# Patient Record
Sex: Female | Born: 1985 | Race: White | Hispanic: No | Marital: Married | Smoking: Never smoker
Health system: Southern US, Community
[De-identification: ages and names within clinical notes are randomized; demographics above are authoritative.]

## PROBLEM LIST (undated history)

## (undated) DIAGNOSIS — K56609 Unspecified intestinal obstruction, unspecified as to partial versus complete obstruction: Secondary | ICD-10-CM

## (undated) DIAGNOSIS — K602 Anal fissure, unspecified: Secondary | ICD-10-CM

## (undated) DIAGNOSIS — E559 Vitamin D deficiency, unspecified: Secondary | ICD-10-CM

## (undated) DIAGNOSIS — E538 Deficiency of other specified B group vitamins: Secondary | ICD-10-CM

## (undated) DIAGNOSIS — K529 Noninfective gastroenteritis and colitis, unspecified: Secondary | ICD-10-CM

## (undated) DIAGNOSIS — K5090 Crohn's disease, unspecified, without complications: Secondary | ICD-10-CM

## (undated) DIAGNOSIS — K635 Polyp of colon: Secondary | ICD-10-CM

## (undated) DIAGNOSIS — D649 Anemia, unspecified: Secondary | ICD-10-CM

## (undated) DIAGNOSIS — N63 Unspecified lump in unspecified breast: Secondary | ICD-10-CM

## (undated) DIAGNOSIS — C50919 Malignant neoplasm of unspecified site of unspecified female breast: Secondary | ICD-10-CM

## (undated) DIAGNOSIS — B019 Varicella without complication: Secondary | ICD-10-CM

## (undated) DIAGNOSIS — Z124 Encounter for screening for malignant neoplasm of cervix: Secondary | ICD-10-CM

## (undated) DIAGNOSIS — L309 Dermatitis, unspecified: Secondary | ICD-10-CM

## (undated) DIAGNOSIS — Z789 Other specified health status: Secondary | ICD-10-CM

## (undated) DIAGNOSIS — K219 Gastro-esophageal reflux disease without esophagitis: Secondary | ICD-10-CM

## (undated) DIAGNOSIS — L509 Urticaria, unspecified: Secondary | ICD-10-CM

## (undated) HISTORY — DX: Polyp of colon: K63.5

## (undated) HISTORY — DX: Anemia, unspecified: D64.9

## (undated) HISTORY — DX: Encounter for screening for malignant neoplasm of cervix: Z12.4

## (undated) HISTORY — DX: Malignant neoplasm of unspecified site of unspecified female breast: C50.919

## (undated) HISTORY — DX: Crohn's disease, unspecified, without complications: K50.90

## (undated) HISTORY — DX: Unspecified intestinal obstruction, unspecified as to partial versus complete obstruction: K56.609

## (undated) HISTORY — DX: Anal fissure, unspecified: K60.2

## (undated) HISTORY — DX: Unspecified lump in unspecified breast: N63.0

## (undated) HISTORY — DX: Noninfective gastroenteritis and colitis, unspecified: K52.9

## (undated) HISTORY — DX: Gastro-esophageal reflux disease without esophagitis: K21.9

## (undated) HISTORY — DX: Varicella without complication: B01.9

## (undated) HISTORY — DX: Vitamin D deficiency, unspecified: E55.9

## (undated) HISTORY — DX: Dermatitis, unspecified: L30.9

## (undated) HISTORY — PX: WISDOM TOOTH EXTRACTION: SHX21

---

## 2011-01-25 DIAGNOSIS — K5090 Crohn's disease, unspecified, without complications: Secondary | ICD-10-CM

## 2011-01-25 HISTORY — DX: Crohn's disease, unspecified, without complications: K50.90

## 2011-11-01 ENCOUNTER — Emergency Department (HOSPITAL_COMMUNITY)
Admission: EM | Admit: 2011-11-01 | Discharge: 2011-11-01 | Disposition: A | Discharge status: Home / Self Care | Payer: BC Managed Care – PPO | Attending: Emergency Medicine | Admitting: Emergency Medicine

## 2011-11-01 ENCOUNTER — Emergency Department (HOSPITAL_COMMUNITY): Payer: BC Managed Care – PPO

## 2011-11-01 ENCOUNTER — Encounter (HOSPITAL_COMMUNITY): Payer: Self-pay

## 2011-11-01 DIAGNOSIS — K5289 Other specified noninfective gastroenteritis and colitis: Secondary | ICD-10-CM | POA: Insufficient documentation

## 2011-11-01 DIAGNOSIS — R000 Tachycardia, unspecified: Secondary | ICD-10-CM | POA: Insufficient documentation

## 2011-11-01 DIAGNOSIS — R109 Unspecified abdominal pain: Secondary | ICD-10-CM | POA: Insufficient documentation

## 2011-11-01 DIAGNOSIS — K529 Noninfective gastroenteritis and colitis, unspecified: Secondary | ICD-10-CM

## 2011-11-01 LAB — COMPREHENSIVE METABOLIC PANEL
ALT: 10 U/L (ref 0–35)
Alkaline Phosphatase: 65 U/L (ref 39–117)
BUN: 8 mg/dL (ref 6–23)
CO2: 24 mEq/L (ref 19–32)
GFR calc Af Amer: >90 mL/min (ref >90)
GFR calc non Af Amer: >90 mL/min (ref >90)
Glucose, Bld: 110 mg/dL — ABNORMAL HIGH (ref 70–99)
Potassium: 3.9 mEq/L (ref 3.5–5.1)
Sodium: 137 mEq/L (ref 135–145)
Total Protein: 7.4 g/dL (ref 6.0–8.3)

## 2011-11-01 LAB — CBC
HCT: 37.4 % (ref 36.0–46.0)
MCHC: 32.6 g/dL (ref 30.0–36.0)
Platelets: 368 10*3/uL (ref 150–400)
RDW: 12.8 % (ref 11.5–15.5)
WBC: 13.9 10*3/uL — ABNORMAL HIGH (ref 4.0–10.5)

## 2011-11-01 LAB — URINALYSIS, ROUTINE W REFLEX MICROSCOPIC
Leukocytes, UA: NEGATIVE
Nitrite: NEGATIVE
Protein, ur: NEGATIVE mg/dL
Specific Gravity, Urine: 1.009 (ref 1.005–1.030)
Urobilinogen, UA: 0.2 mg/dL (ref 0.0–1.0)

## 2011-11-01 LAB — URINE MICROSCOPIC-ADD ON

## 2011-11-01 LAB — LIPASE, BLOOD: Lipase: 49 U/L (ref 11–59)

## 2011-11-01 LAB — WET PREP, GENITAL

## 2011-11-01 LAB — POCT PREGNANCY, URINE: Preg Test, Ur: NEGATIVE

## 2011-11-01 MED ORDER — ONDANSETRON HCL 4 MG/2ML IJ SOLN
4.0000 mg | Freq: Once | INTRAMUSCULAR | Status: AC
  Administered 2011-11-01: 4 mg via INTRAVENOUS
  Filled 2011-11-01: qty 2

## 2011-11-01 MED ORDER — MORPHINE SULFATE 4 MG/ML IJ SOLN
4.0000 mg | Freq: Once | INTRAMUSCULAR | Status: AC
  Administered 2011-11-01: 4 mg via INTRAVENOUS
  Filled 2011-11-01: qty 1

## 2011-11-01 MED ORDER — METRONIDAZOLE 500 MG PO TABS: 500.0000 mg | ORAL_TABLET | Freq: Two times a day (BID) | ORAL | Status: DC

## 2011-11-01 MED ORDER — METRONIDAZOLE 500 MG PO TABS
500.0000 mg | ORAL_TABLET | Freq: Once | ORAL | Status: AC
  Administered 2011-11-01: 500 mg via ORAL
  Filled 2011-11-01: qty 1

## 2011-11-01 MED ORDER — OXYCODONE-ACETAMINOPHEN 5-325 MG PO TABS
1.0000 | ORAL_TABLET | Freq: Once | ORAL | Status: AC
  Administered 2011-11-01: 1 via ORAL
  Filled 2011-11-01: qty 1

## 2011-11-01 MED ORDER — HYDROCODONE-ACETAMINOPHEN 5-500 MG PO TABS: 1.0000 | ORAL_TABLET | Freq: Four times a day (QID) | ORAL | Status: DC | PRN

## 2011-11-01 MED ORDER — CIPROFLOXACIN HCL 500 MG PO TABS: 500.0000 mg | ORAL_TABLET | Freq: Two times a day (BID) | ORAL | Status: DC

## 2011-11-01 MED ORDER — CIPROFLOXACIN HCL 500 MG PO TABS
500.0000 mg | ORAL_TABLET | Freq: Once | ORAL | Status: AC
  Administered 2011-11-01: 500 mg via ORAL
  Filled 2011-11-01: qty 1

## 2011-11-01 MED ORDER — IOHEXOL 300 MG/ML  SOLN
100.0000 mL | Freq: Once | INTRAMUSCULAR | Status: AC | PRN
  Administered 2011-11-01: 100 mL via INTRAVENOUS

## 2011-11-01 MED ORDER — ONDANSETRON HCL 4 MG PO TABS: 4.0000 mg | ORAL_TABLET | Freq: Four times a day (QID) | ORAL | Status: DC

## 2011-11-01 NOTE — ED Provider Notes (Signed)
History     CSN: [id number]  Arrival date & time 11/01/11  0901   First MD Initiated Contact with Patient 11/01/11 [phone]      Chief Complaint  Patient presents with  . Abdominal Pain    RLQ pain severe since 5am    (Consider location/radiation/quality/duration/timing/severity/associated sxs/prior treatment) Patient is a 26 y.o. female presenting with abdominal pain. The history is provided by the patient.  Abdominal Pain The primary symptoms of the illness include abdominal pain, nausea and vomiting. The primary symptoms of the illness do not include diarrhea, dysuria or vaginal discharge. Episode onset: Mild abdominal pain starting last night. Woke up at 5:30 AM with severe pain. The onset of the illness was gradual. The problem has been rapidly worsening.  The abdominal pain is located in the RLQ and periumbilical region. The abdominal pain does not radiate. The severity of the abdominal pain is 10/10. The abdominal pain is relieved by being still. The abdominal pain is exacerbated by movement.  The patient states that she believes she is currently not pregnant. The patient has not had a change in bowel habit. Additional symptoms associated with the illness include anorexia. Symptoms associated with the illness do not include urgency, frequency or back pain. Associated medical issues comments: none.    History reviewed. No pertinent past medical history.  History reviewed. No pertinent past surgical history.  No family history on file.  History  Substance Use Topics  . Smoking status: Not on file  . Smokeless tobacco: Not on file  . Alcohol Use: No    OB History    Grav Para Term Preterm Abortions TAB SAB Ect Mult Living                  Review of Systems  Gastrointestinal: Positive for nausea, vomiting, abdominal pain and anorexia. Negative for diarrhea.  Genitourinary: Negative for dysuria, urgency, frequency and vaginal discharge.  Musculoskeletal: Negative for back  pain.  All other systems reviewed and are negative.    Allergies  Review of patient's allergies indicates no known allergies.  Home Medications  No current outpatient prescriptions on file.  BP 103/61  Pulse 103  Temp 98.8 F (37.1 C) (Oral)  Resp 18  Ht 5' 3"  (1.6 m)  Wt 125 lb (56.7 kg)  BMI 22.14 kg/m2  SpO2 100%  LMP 10/28/2011  Physical Exam  Nursing note and vitals reviewed. Constitutional: She is oriented to person, place, and time. She appears well-developed and well-nourished. She appears distressed.  HENT:  Head: Normocephalic and atraumatic.  Mouth/Throat: Oropharynx is clear and moist.  Eyes: Conjunctivae normal and EOM are normal. Pupils are equal, round, and reactive to light.  Neck: Normal range of motion. Neck supple.  Cardiovascular: Regular rhythm and intact distal pulses.  Tachycardia present.   No murmur heard. Pulmonary/Chest: Effort normal and breath sounds normal. No respiratory distress. She has no wheezes. She has no rales.  Abdominal: Soft. Normal appearance. She exhibits no distension. There is tenderness in the right lower quadrant and periumbilical area. There is rebound and guarding. There is no CVA tenderness.  Genitourinary: Vagina normal and uterus normal. Cervix exhibits no motion tenderness, no discharge and no friability. Right adnexum displays no tenderness. Left adnexum displays no tenderness. No tenderness or bleeding around the vagina. No vaginal discharge found.  Musculoskeletal: Normal range of motion. She exhibits no edema and no tenderness.  Neurological: She is alert and oriented to person, place, and time.  Skin: Skin is  warm and dry. No rash noted. No erythema.  Psychiatric: She has a normal mood and affect. Her behavior is normal.    ED Course  Procedures (including critical care time)  Labs Reviewed  COMPREHENSIVE METABOLIC PANEL - Abnormal; Notable for the following:    Glucose, Bld 110 (*)     All other components  within normal limits  CBC - Abnormal; Notable for the following:    WBC 13.9 (*)     All other components within normal limits  URINALYSIS, ROUTINE W REFLEX MICROSCOPIC - Abnormal; Notable for the following:    APPearance CLOUDY (*)     Hgb urine dipstick SMALL (*)     Ketones, ur 15 (*)     All other components within normal limits  WET PREP, GENITAL - Abnormal; Notable for the following:    Yeast Wet Prep HPF POC RARE (*)     Clue Cells Wet Prep HPF POC RARE (*)     WBC, Wet Prep HPF POC FEW (*)     All other components within normal limits  URINE MICROSCOPIC-ADD ON - Abnormal; Notable for the following:    Bacteria, UA FEW (*)     All other components within normal limits  LIPASE, BLOOD  POCT PREGNANCY, URINE  GC/CHLAMYDIA PROBE AMP, GENITAL   Ct Abdomen Pelvis W Contrast  11/01/2011  *RADIOLOGY REPORT*  Clinical Data: Right side abdominal pain for 1 day, nausea, negative urine pregnancy test, question appendicitis  CT ABDOMEN AND PELVIS WITH CONTRAST  Technique:  Multidetector CT imaging of the abdomen and pelvis was performed following the standard protocol during bolus administration of intravenous contrast. Sagittal and coronal MPR images reconstructed from axial data set.  Contrast: 189m OMNIPAQUE IOHEXOL 300 MG/ML  SOLN Dilute oral contrast.  Comparison: None  Findings: Dependent atelectasis bilateral lower lobes. Liver, spleen, pancreas, kidneys, and adrenal glands normal. Normal appendix. Wall thickening of terminal ileum with adjacent infiltrative/inflammatory changes. Small amount of free fluid is seen within the pericolic gutters and dependently in pelvis. Minimal wall thickening at the cecum. Findings are most suspicious for Crohn's disease though infection is not excluded. Remaining bowel loops unremarkable. Normal appearance of bladder, uterus, adnexae, and ureters. No mass, adenopathy, hernia, or free air. No acute osseous findings.  IMPRESSION: Bowel wall thickening of the  terminal ileum extending to the ileocecal valve with mild adjacent inflammatory changes and small amounts of free intraperitoneal fluid. Findings are most suspicious for Crohn's disease though infectious enteritis is not completely excluded. Normal appendix.   Original Report Authenticated By: MBurnetta Sabin M.D.      1. Enteritis       MDM   Patient with a history of mild abdominal pain last night but waking up this morning with severe epigastric and right lower cautery pain. She denies any flank pain or symptoms suggestive of kidney stone. She has significant right lower quadrant pain with guarding and rebound tenderness. Concern for appendicitis or could be ovarian pathology. However patient denies history of ovarian cyst, no symptoms suggestive of torsion and recently finished her menses making ectopic pregnancy less likely. CBC, CMP, lipase, UA, UPT pending. Patient given pain control  10:30 AM Leukocytosis of 13,000 and normal CMP and lipase. UA and UPT pending but on pelvic exam she has no tenderness of her ovaries so will do a CT to rule out appendicitis  2:16 PM Pt with no prior hx of crohn's and no hx of chronic abd pain.  CT shows  possible infectious enteritis and with the presentation of sx most likely the cause.  Will po challenge and give oral pain meds and abx.    Blanchie Dessert, MD 11/01/11 [phone]

## 2011-11-01 NOTE — ED Notes (Signed)
Pt given water, able to keep it down, no nausea/vomiting.

## 2011-11-01 NOTE — Progress Notes (Signed)
No pcp per pt CM reviewed with her and 2 females at her beside how to obtain an in network pcp near her residence using [employer] toll free number and web site

## 2011-11-01 NOTE — ED Notes (Signed)
Per [facility]- Pt reports sudden onset RLQ stomach pain- nausea and vomit x 1 - denies fever- Pt reports sever tenderness to RUQ and RLQ pain

## 2011-11-04 ENCOUNTER — Ambulatory Visit (INDEPENDENT_AMBULATORY_CARE_PROVIDER_SITE_OTHER): Payer: BC Managed Care – PPO | Admitting: Family Medicine

## 2011-11-04 ENCOUNTER — Encounter: Payer: Self-pay | Admitting: Family Medicine

## 2011-11-04 ENCOUNTER — Encounter: Payer: Self-pay | Admitting: Gastroenterology

## 2011-11-04 VITALS — BP 112/74 | HR 106 | Temp 98.1°F | Ht 63.0 in | Wt 121.8 lb

## 2011-11-04 DIAGNOSIS — K5289 Other specified noninfective gastroenteritis and colitis: Secondary | ICD-10-CM

## 2011-11-04 DIAGNOSIS — K529 Noninfective gastroenteritis and colitis, unspecified: Secondary | ICD-10-CM

## 2011-11-04 DIAGNOSIS — Z0000 Encounter for general adult medical examination without abnormal findings: Secondary | ICD-10-CM

## 2011-11-04 MED ORDER — HYDROCODONE-ACETAMINOPHEN 5-325 MG PO TABS: 1.0000 | ORAL_TABLET | Freq: Four times a day (QID) | ORAL | Status: DC | PRN

## 2011-11-04 MED ORDER — PROBIOTIC PRODUCT PO CHEW: CHEWABLE_TABLET | ORAL | Status: DC

## 2011-11-04 MED ORDER — ONDANSETRON HCL 4 MG PO TABS: 4.0000 mg | ORAL_TABLET | Freq: Four times a day (QID) | ORAL | Status: DC

## 2011-11-04 NOTE — Patient Instructions (Addendum)
Preventive Care for Adults, Female A healthy lifestyle and preventive care can promote health and wellness. Preventive health guidelines for women include the following key practices.  A routine yearly physical is a good way to check with your caregiver about your health and preventive screening. It is a chance to share any concerns and updates on your health, and to receive a thorough exam.  Visit your dentist for a routine exam and preventive care every 6 months. Brush your teeth twice a day and floss once a day. Good oral hygiene prevents tooth decay and gum disease.  The frequency of eye exams is based on your age, health, family medical history, use of contact lenses, and other factors. Follow your caregiver's recommendations for frequency of eye exams.  Eat a healthy diet. Foods like vegetables, fruits, whole grains, low-fat dairy products, and lean protein foods contain the nutrients you need without too many calories. Decrease your intake of foods high in solid fats, added sugars, and salt. Eat the right amount of calories for you.Get information about a proper diet from your caregiver, if necessary.  Regular physical exercise is one of the most important things you can do for your health. Most adults should get at least 150 minutes of moderate-intensity exercise (any activity that increases your heart rate and causes you to sweat) each week. In addition, most adults need muscle-strengthening exercises on 2 or more days a week.  Maintain a healthy weight. The body mass index (BMI) is a screening tool to identify possible weight problems. It provides an estimate of body fat based on height and weight. Your caregiver can help determine your BMI, and can help you achieve or maintain a healthy weight.For adults 20 years and older:  A BMI below 18.5 is considered underweight.  A BMI of 18.5 to 24.9 is normal.  A BMI of 25 to 29.9 is considered overweight.  A BMI of 30 and above is  considered obese.  Maintain normal blood lipids and cholesterol levels by exercising and minimizing your intake of saturated fat. Eat a balanced diet with plenty of fruit and vegetables. Blood tests for lipids and cholesterol should begin at age 25 and be repeated every 5 years. If your lipid or cholesterol levels are high, you are over 50, or you are at high risk for heart disease, you may need your cholesterol levels checked more frequently.Ongoing high lipid and cholesterol levels should be treated with medicines if diet and exercise are not effective.  If you smoke, find out from your caregiver how to quit. If you do not use tobacco, do not start.  If you are pregnant, do not drink alcohol. If you are breastfeeding, be very cautious about drinking alcohol. If you are not pregnant and choose to drink alcohol, do not exceed 1 drink per day. One drink is considered to be 12 ounces (355 mL) of beer, 5 ounces (148 mL) of wine, or 1.5 ounces (44 mL) of liquor.  Avoid use of street drugs. Do not share needles with anyone. Ask for help if you need support or instructions about stopping the use of drugs.  High blood pressure causes heart disease and increases the risk of stroke. Your blood pressure should be checked at least every 1 to 2 years. Ongoing high blood pressure should be treated with medicines if weight loss and exercise are not effective.  If you are 62 to 24 years old, ask your caregiver if you should take aspirin to prevent strokes.  Diabetes  screening involves taking a blood sample to check your fasting blood sugar level. This should be done once every 3 years, after age 51, if you are within normal weight and without risk factors for diabetes. Testing should be considered at a younger age or be carried out more frequently if you are overweight and have at least 1 risk factor for diabetes.  Breast cancer screening is essential preventive care for women. You should practice "breast  self-awareness." This means understanding the normal appearance and feel of your breasts and may include breast self-examination. Any changes detected, no matter how small, should be reported to a caregiver. Women in their 30s and 30s should have a clinical breast exam (CBE) by a caregiver as part of a regular health exam every 1 to 3 years. After age 25, women should have a CBE every year. Starting at age 37, women should consider having a mammography (breast X-ray test) every year. Women who have a family history of breast cancer should talk to their caregiver about genetic screening. Women at a high risk of breast cancer should talk to their caregivers about having magnetic resonance imaging (MRI) and a mammography every year.  The Pap test is a screening test for cervical cancer. A Pap test can show cell changes on the cervix that might become cervical cancer if left untreated. A Pap test is a procedure in which cells are obtained and examined from the lower end of the uterus (cervix).  Women should have a Pap test starting at age 87.  Between ages 63 and 54, Pap tests should be repeated every 2 years.  Beginning at age 33, you should have a Pap test every 3 years as long as the past 3 Pap tests have been normal.  Some women have medical problems that increase the chance of getting cervical cancer. Talk to your caregiver about these problems. It is especially important to talk to your caregiver if a new problem develops soon after your last Pap test. In these cases, your caregiver may recommend more frequent screening and Pap tests.  The above recommendations are the same for women who have or have not gotten the vaccine for human papillomavirus (HPV).  If you had a hysterectomy for a problem that was not cancer or a condition that could lead to cancer, then you no longer need Pap tests. Even if you no longer need a Pap test, a regular exam is a good idea to make sure no other problems are  starting.  If you are between ages 67 and 53, and you have had normal Pap tests going back 10 years, you no longer need Pap tests. Even if you no longer need a Pap test, a regular exam is a good idea to make sure no other problems are starting.  If you have had past treatment for cervical cancer or a condition that could lead to cancer, you need Pap tests and screening for cancer for at least 20 years after your treatment.  If Pap tests have been discontinued, risk factors (such as a new sexual partner) need to be reassessed to determine if screening should be resumed.  The HPV test is an additional test that may be used for cervical cancer screening. The HPV test looks for the virus that can cause the cell changes on the cervix. The cells collected during the Pap test can be tested for HPV. The HPV test could be used to screen women aged 59 years and older, and should  be used in women of any age who have unclear Pap test results. After the age of 104, women should have HPV testing at the same frequency as a Pap test.  Colorectal cancer can be detected and often prevented. Most routine colorectal cancer screening begins at the age of 7 and continues through age 20. However, your caregiver may recommend screening at an earlier age if you have risk factors for colon cancer. On a yearly basis, your caregiver may provide home test kits to check for hidden blood in the stool. Use of a small camera at the end of a tube, to directly examine the colon (sigmoidoscopy or colonoscopy), can detect the earliest forms of colorectal cancer. Talk to your caregiver about this at age 47, when routine screening begins. Direct examination of the colon should be repeated every 5 to 10 years through age 62, unless early forms of pre-cancerous polyps or small growths are found.  Hepatitis C blood testing is recommended for all people born from [birth year] through 1965 and any individual with known risks for hepatitis C.  Practice  safe sex. Use condoms and avoid high-risk sexual practices to reduce the spread of sexually transmitted infections (STIs). STIs include gonorrhea, chlamydia, syphilis, trichomonas, herpes, HPV, and human immunodeficiency virus (HIV). Herpes, HIV, and HPV are viral illnesses that have no cure. They can result in disability, cancer, and death. Sexually active women aged 78 and younger should be checked for chlamydia. Older women with new or multiple partners should also be tested for chlamydia. Testing for other STIs is recommended if you are sexually active and at increased risk.  Osteoporosis is a disease in which the bones lose minerals and strength with aging. This can result in serious bone fractures. The risk of osteoporosis can be identified using a bone density scan. Women ages 12 and over and women at risk for fractures or osteoporosis should discuss screening with their caregivers. Ask your caregiver whether you should take a calcium supplement or vitamin D to reduce the rate of osteoporosis.  Menopause can be associated with physical symptoms and risks. Hormone replacement therapy is available to decrease symptoms and risks. You should talk to your caregiver about whether hormone replacement therapy is right for you.  Use sunscreen with sun protection factor (SPF) of 30 or more. Apply sunscreen liberally and repeatedly throughout the day. You should seek shade when your shadow is shorter than you. Protect yourself by wearing long sleeves, pants, a wide-brimmed hat, and sunglasses year round, whenever you are outdoors.  Once a month, do a whole body skin exam, using a mirror to look at the skin on your back. Notify your caregiver of new moles, moles that have irregular borders, moles that are larger than a pencil eraser, or moles that have changed in shape or color.  Stay current with required immunizations.  Influenza. You need a dose every fall (or winter). The composition of the flu vaccine  changes each year, so being vaccinated once is not enough.  Pneumococcal polysaccharide. You need 1 to 2 doses if you smoke cigarettes or if you have certain chronic medical conditions. You need 1 dose at age 26 (or older) if you have never been vaccinated.  Tetanus, diphtheria, pertussis (Tdap, Td). Get 1 dose of Tdap vaccine if you are younger than age 30, are over 32 and have contact with an infant, are a Dietitian, are pregnant, or simply want to be protected from whooping cough. After that, you need a Td  booster dose every 10 years. Consult your caregiver if you have not had at least 3 tetanus and diphtheria-containing shots sometime in your life or have a deep or dirty wound.  HPV. You need this vaccine if you are a woman age 50 or younger. The vaccine is given in 3 doses over 6 months.  Measles, mumps, rubella (MMR). You need at least 1 dose of MMR if you were born in 1957 or later. You may also need a second dose.  Meningococcal. If you are age 54 to 46 and a first-year college student living in a residence hall, or have one of several medical conditions, you need to get vaccinated against meningococcal disease. You may also need additional booster doses.  Zoster (shingles). If you are age 27 or older, you should get this vaccine.  Varicella (chickenpox). If you have never had chickenpox or you were vaccinated but received only 1 dose, talk to your caregiver to find out if you need this vaccine.  Hepatitis A. You need this vaccine if you have a specific risk factor for hepatitis A virus infection or you simply wish to be protected from this disease. The vaccine is usually given as 2 doses, 6 to 18 months apart.  Hepatitis B. You need this vaccine if you have a specific risk factor for hepatitis B virus infection or you simply wish to be protected from this disease. The vaccine is given in 3 doses, usually over 6 months. Preventive Services / Frequency Ages 23 to 96  Blood  pressure check.** / Every 1 to 2 years.  Lipid and cholesterol check.** / Every 5 years beginning at age 23.  Clinical breast exam.** / Every 3 years for women in their 42s and 73s.  Pap test.** / Every 2 years from ages 30 through 38. Every 3 years starting at age 25 through age 20 or 63 with a history of 3 consecutive normal Pap tests.  HPV screening.** / Every 3 years from ages 11 through ages 25 to 35 with a history of 3 consecutive normal Pap tests.  Hepatitis C blood test.** / For any individual with known risks for hepatitis C.  Skin self-exam. / Monthly.  Influenza immunization.** / Every year.  Pneumococcal polysaccharide immunization.** / 1 to 2 doses if you smoke cigarettes or if you have certain chronic medical conditions.  Tetanus, diphtheria, pertussis (Tdap, Td) immunization. / A one-time dose of Tdap vaccine. After that, you need a Td booster dose every 10 years.  HPV immunization. / 3 doses over 6 months, if you are 29 and younger.  Measles, mumps, rubella (MMR) immunization. / You need at least 1 dose of MMR if you were born in 1957 or later. You may also need a second dose.  Meningococcal immunization. / 1 dose if you are age 40 to 32 and a first-year college student living in a residence hall, or have one of several medical conditions, you need to get vaccinated against meningococcal disease. You may also need additional booster doses.  Varicella immunization.** / Consult your caregiver.  Hepatitis A immunization.** / Consult your caregiver. 2 doses, 6 to 18 months apart.  Hepatitis B immunization.** / Consult your caregiver. 3 doses usually over 6 months. Ages 45 to 17  Blood pressure check.** / Every 1 to 2 years.  Lipid and cholesterol check.** / Every 5 years beginning at age 19.  Clinical breast exam.** / Every year after age 75.  Mammogram.** / Every year beginning at age 12  and continuing for as long as you are in good health. Consult with your  caregiver.  Pap test.** / Every 3 years starting at age 7 through age 75 or 63 with a history of 3 consecutive normal Pap tests.  HPV screening.** / Every 3 years from ages 63 through ages 62 to 34 with a history of 3 consecutive normal Pap tests.  Fecal occult blood test (FOBT) of stool. / Every year beginning at age 7 and continuing until age 45. You may not need to do this test if you get a colonoscopy every 10 years.  Flexible sigmoidoscopy or colonoscopy.** / Every 5 years for a flexible sigmoidoscopy or every 10 years for a colonoscopy beginning at age 28 and continuing until age 79.  Hepatitis C blood test.** / For all people born from [birth year] through 1963 and any individual with known risks for hepatitis C.  Skin self-exam. / Monthly.  Influenza immunization.** / Every year.  Pneumococcal polysaccharide immunization.** / 1 to 2 doses if you smoke cigarettes or if you have certain chronic medical conditions.  Tetanus, diphtheria, pertussis (Tdap, Td) immunization.** / A one-time dose of Tdap vaccine. After that, you need a Td booster dose every 10 years.  Measles, mumps, rubella (MMR) immunization. / You need at least 1 dose of MMR if you were born in 1957 or later. You may also need a second dose.  Varicella immunization.** / Consult your caregiver.  Meningococcal immunization.** / Consult your caregiver.  Hepatitis A immunization.** / Consult your caregiver. 2 doses, 6 to 18 months apart.  Hepatitis B immunization.** / Consult your caregiver. 3 doses, usually over 6 months. Ages 86 and over  Blood pressure check.** / Every 1 to 2 years.  Lipid and cholesterol check.** / Every 5 years beginning at age 10.  Clinical breast exam.** / Every year after age 34.  Mammogram.** / Every year beginning at age 33 and continuing for as long as you are in good health. Consult with your caregiver.  Pap test.** / Every 3 years starting at age 93 through age 59 or 57 with a 3  consecutive normal Pap tests. Testing can be stopped between 65 and 70 with 3 consecutive normal Pap tests and no abnormal Pap or HPV tests in the past 10 years.  HPV screening.** / Every 3 years from ages 41 through ages 93 or 37 with a history of 3 consecutive normal Pap tests. Testing can be stopped between 65 and 70 with 3 consecutive normal Pap tests and no abnormal Pap or HPV tests in the past 10 years.  Fecal occult blood test (FOBT) of stool. / Every year beginning at age 70 and continuing until age 28. You may not need to do this test if you get a colonoscopy every 10 years.  Flexible sigmoidoscopy or colonoscopy.** / Every 5 years for a flexible sigmoidoscopy or every 10 years for a colonoscopy beginning at age 41 and continuing until age 2.  Hepatitis C blood test.** / For all people born from [birth year] through 1963 and any individual with known risks for hepatitis C.  Osteoporosis screening.** / A one-time screening for women ages 12 and over and women at risk for fractures or osteoporosis.  Skin self-exam. / Monthly.  Influenza immunization.** / Every year.  Pneumococcal polysaccharide immunization.** / 1 dose at age 80 (or older) if you have never been vaccinated.  Tetanus, diphtheria, pertussis (Tdap, Td) immunization. / A one-time dose of Tdap vaccine if you are over  65 and have contact with an infant, are a Dietitian, or simply want to be protected from whooping cough. After that, you need a Td booster dose every 10 years.  Varicella immunization.** / Consult your caregiver.  Meningococcal immunization.** / Consult your caregiver.  Hepatitis A immunization.** / Consult your caregiver. 2 doses, 6 to 18 months apart.  Hepatitis B immunization.** / Check with your caregiver. 3 doses, usually over 6 months. ** Family history and personal history of risk and conditions may change your caregiver's recommendations. Document Released: 03/08/2001 Document Revised: 04/04/2011  Document Reviewed: 06/07/2010 [facility] Patient Information 2013 [place].    Colitis Colitis is inflammation of the colon. Colitis can be a short-term or long-standing (chronic) illness. Crohn's disease and ulcerative colitis are 2 types of colitis which are chronic. They usually require lifelong treatment. CAUSES  There are many different causes of colitis, including:  Viruses.  Germs (bacteria).  Medicine reactions. SYMPTOMS   Diarrhea.  Intestinal bleeding.  Pain.  Fever.  Throwing up (vomiting).  Tiredness (fatigue).  Weight loss.  Bowel blockage. DIAGNOSIS  The diagnosis of colitis is based on examination and stool or blood tests. X-rays, CT scan, and colonoscopy may also be needed. TREATMENT  Treatment may include:  Fluids given through the vein (intravenously).  Bowel rest (nothing to eat or drink for a period of time).  Medicine for pain and diarrhea.  Medicines (antibiotics) that kill germs.  Cortisone medicines.  Surgery. HOME CARE INSTRUCTIONS   Get plenty of rest.  Drink enough water and fluids to keep your urine clear or pale yellow.  Eat a well-balanced diet.  Call your caregiver for follow-up as recommended. SEEK IMMEDIATE MEDICAL CARE IF:   You develop chills.  You have an oral temperature above 102 F (38.9 C), not controlled by medicine.  You have extreme weakness, fainting, or dehydration.  You have repeated vomiting.  You develop severe belly (abdominal) pain or are passing bloody or tarry stools. MAKE SURE YOU:   Understand these instructions.  Will watch your condition.  Will get help right away if you are not doing well or get worse. Document Released: 02/18/2004 Document Revised: 04/04/2011 Document Reviewed: 05/15/2009 [facility] Patient Information 2013 [place].

## 2011-11-06 ENCOUNTER — Encounter: Payer: Self-pay | Admitting: Family Medicine

## 2011-11-06 DIAGNOSIS — Z0000 Encounter for general adult medical examination without abnormal findings: Secondary | ICD-10-CM | POA: Insufficient documentation

## 2011-11-06 DIAGNOSIS — K529 Noninfective gastroenteritis and colitis, unspecified: Secondary | ICD-10-CM | POA: Insufficient documentation

## 2011-11-06 NOTE — Progress Notes (Signed)
Patient ID: Carla Clark, female   DOB: May 03, 1985, 26 y.o.   MRN: [id number] Jerry Haugen [id number] Jul 19, 1985 11/06/2011      Progress Note New Patient  Subjective  Chief Complaint  Chief Complaint  Patient presents with  . Establish Care    new pt/ hospital follow up    HPI  Patient is a 26 year old Caucasian female who is in today for new patient appointment. She was just in the emergency room secondary to severe diarrhea, pain and fevers and was diagnosed with acute colitis. CT scan was concerning for inflammatory bowel disease so she is here today to establish care and receive a referral to gastroenterology. She is feeling tremendously better with Cipro and Flagyl. Still has some abdominal discomfort and mild anorexia but is improving. No fevers or chills. No chest pain, palpitations, shortness of breath. No similar episodes in the past  Past Medical History  Diagnosis Date  . Chicken pox as a child  . Colitis 11/06/2011  . Preventative health care 11/06/2011    Past Surgical History  Procedure Date  . Wisdom tooth extraction 27 yrs old    Family History  Problem Relation Age of Onset  . Nephrolithiasis Father   . Cancer Maternal Grandmother     lung- smoker  . Gout Maternal Grandfather   . Cancer Paternal Grandmother     ovarian  . Cancer Paternal Grandfather     pancreatic- smoker    History   Social History  . Marital Status: Single    Spouse Name: N/A    Number of Children: N/A  . Years of Education: N/A   Occupational History  . Not on file.   Social History Main Topics  . Smoking status: Never Smoker   . Smokeless tobacco: Never Used  . Alcohol Use: Yes     occasionaly  . Drug Use: No  . Sexually Active: Yes -- Female partner(s)   Other Topics Concern  . Not on file   Social History Narrative  . No narrative on file    Current Outpatient Prescriptions on File Prior to Visit  Medication Sig Dispense Refill  . ciprofloxacin (CIPRO) 500  MG tablet Take 1 tablet (500 mg total) by mouth every 12 (twelve) hours.  10 tablet  0  . metroNIDAZOLE (FLAGYL) 500 MG tablet Take 1 tablet (500 mg total) by mouth 2 (two) times daily.  14 tablet  0    No Known Allergies  Review of Systems  Review of Systems  Constitutional: Positive for fever, chills and malaise/fatigue.  HENT: Negative for congestion.   Eyes: Negative for discharge.  Respiratory: Negative for shortness of breath.   Cardiovascular: Negative for chest pain, palpitations and leg swelling.  Gastrointestinal: Positive for heartburn, nausea, vomiting, abdominal pain and diarrhea. Negative for constipation, blood in stool and melena.  Genitourinary: Negative for dysuria.  Musculoskeletal: Negative for falls.  Skin: Negative for rash.  Neurological: Negative for loss of consciousness and headaches.  Endo/Heme/Allergies: Negative for polydipsia.  Psychiatric/Behavioral: Negative for depression and suicidal ideas. The patient is not nervous/anxious and does not have insomnia.     Objective  BP 112/74  Pulse 106  Temp 98.1 F (36.7 C) (Temporal)  Ht 5' 3"  (1.6 m)  Wt 121 lb 12.8 oz (55.248 kg)  BMI 21.58 kg/m2  SpO2 99%  LMP 10/31/2011  Physical Exam  Physical Exam  Constitutional: She is oriented to person, place, and time and well-developed, well-nourished, and in no distress. No distress.  HENT:  Head: Normocephalic and atraumatic.  Right Ear: External ear normal.  Left Ear: External ear normal.  Nose: Nose normal.  Mouth/Throat: Oropharynx is clear and moist. No oropharyngeal exudate.  Eyes: Conjunctivae normal are normal. Pupils are equal, round, and reactive to light. Right eye exhibits no discharge. Left eye exhibits no discharge. No scleral icterus.  Neck: Normal range of motion. Neck supple. No thyromegaly present.  Cardiovascular: Normal rate, regular rhythm, normal heart sounds and intact distal pulses.   No murmur heard. Pulmonary/Chest: Effort  normal and breath sounds normal. No respiratory distress. She has no wheezes. She has no rales.  Abdominal: Soft. Bowel sounds are normal. She exhibits no distension and no mass. There is tenderness.       Mildly tender with palp near umbilicus  Musculoskeletal: Normal range of motion. She exhibits no edema and no tenderness.  Lymphadenopathy:    She has no cervical adenopathy.  Neurological: She is alert and oriented to person, place, and time. She has normal reflexes. No cranial nerve deficit. Coordination normal.  Skin: Skin is warm and dry. No rash noted. She is not diaphoretic.  Psychiatric: Mood, memory and affect normal.       Assessment & Plan  Acute colitis Patient with acute onset of symptoms and good response to medications. Will finish course of Ciprofloxacin and Flagyl, start a probiotic, and given a small refill on Zofran and pain meds ot use prn. Referred to Gastroenterology for evaluation of CT scan results and possiblity of Inflammatory bowel disease.  Preventative health care Declines flu and Tdap shots. Is encouraged to consider returning for baseline fasting labsin the next few months and sooner if symptoms worsen again

## 2011-11-06 NOTE — Assessment & Plan Note (Signed)
Patient with acute onset of symptoms and good response to medications. Will finish course of Ciprofloxacin and Flagyl, start a probiotic, and given a small refill on Zofran and pain meds ot use prn. Referred to Gastroenterology for evaluation of CT scan results and possiblity of Inflammatory bowel disease.

## 2011-11-06 NOTE — Assessment & Plan Note (Signed)
Declines flu and Tdap shots. Is encouraged to consider returning for baseline fasting labsin the next few months and sooner if symptoms worsen again

## 2011-11-24 ENCOUNTER — Encounter: Payer: Self-pay | Admitting: *Deleted

## 2011-11-25 DIAGNOSIS — D649 Anemia, unspecified: Secondary | ICD-10-CM

## 2011-11-25 HISTORY — DX: Anemia, unspecified: D64.9

## 2011-11-29 ENCOUNTER — Encounter: Payer: Self-pay | Admitting: Gastroenterology

## 2011-11-29 ENCOUNTER — Other Ambulatory Visit (INDEPENDENT_AMBULATORY_CARE_PROVIDER_SITE_OTHER): Payer: BC Managed Care – PPO

## 2011-11-29 ENCOUNTER — Ambulatory Visit (INDEPENDENT_AMBULATORY_CARE_PROVIDER_SITE_OTHER): Payer: BC Managed Care – PPO | Admitting: Gastroenterology

## 2011-11-29 VITALS — BP 100/60 | HR 60 | Ht 63.5 in | Wt 121.4 lb

## 2011-11-29 DIAGNOSIS — R195 Other fecal abnormalities: Secondary | ICD-10-CM

## 2011-11-29 DIAGNOSIS — R1903 Right lower quadrant abdominal swelling, mass and lump: Secondary | ICD-10-CM

## 2011-11-29 DIAGNOSIS — R933 Abnormal findings on diagnostic imaging of other parts of digestive tract: Secondary | ICD-10-CM

## 2011-11-29 LAB — SEDIMENTATION RATE: Sed Rate: 27 mm/hr — ABNORMAL HIGH (ref 0–22)

## 2011-11-29 LAB — CBC WITH DIFFERENTIAL/PLATELET
Basophils Relative: 0.7 % (ref 0.0–3.0)
Eosinophils Absolute: 0.2 10*3/uL (ref 0.0–0.7)
Eosinophils Relative: 3.3 % (ref 0.0–5.0)
HCT: 37.1 % (ref 36.0–46.0)
Lymphs Abs: 1.4 10*3/uL (ref 0.7–4.0)
MCHC: 31.8 g/dL (ref 30.0–36.0)
MCV: 81.8 fl (ref 78.0–100.0)
Monocytes Absolute: 0.6 10*3/uL (ref 0.1–1.0)
Platelets: 328 10*3/uL (ref 150.0–400.0)
WBC: 7.1 10*3/uL (ref 4.5–10.5)

## 2011-11-29 LAB — COMPREHENSIVE METABOLIC PANEL
AST: 18 U/L (ref 0–37)
Alkaline Phosphatase: 54 U/L (ref 39–117)
Glucose, Bld: 101 mg/dL — ABNORMAL HIGH (ref 70–99)
Potassium: 3.8 mEq/L (ref 3.5–5.1)
Sodium: 136 mEq/L (ref 135–145)
Total Bilirubin: 0.4 mg/dL (ref 0.3–1.2)
Total Protein: 7.4 g/dL (ref 6.0–8.3)

## 2011-11-29 LAB — C-REACTIVE PROTEIN: CRP: 0.5 mg/dL (ref 0.5–20.0)

## 2011-11-29 MED ORDER — MOVIPREP 100 G PO SOLR: 1.0000 | Freq: Once | ORAL | Status: DC

## 2011-11-29 NOTE — Patient Instructions (Signed)
You have been scheduled for a colonoscopy with propofol. Please follow written instructions given to you at your visit today.  Please pick up your prep kit at the pharmacy within the next 1-3 days. If you use inhalers (even only as needed) or a CPAP machine, please bring them with you on the day of your procedure.  Your physician has requested that you go to the basement for lab work before leaving today  We have sent the following medications to your pharmacy for you to pick up at your convenience:Moviprep

## 2011-11-29 NOTE — Progress Notes (Signed)
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 She presented with acute lower nominal pain on October 8 require emergency room visit were CT scan of the abdomen showed rather marked inflammation of her terminal ileum. Surprisingly, she had no diarrhea, rectal bleeding, or nausea and vomiting. She was treated with analgesics, then by mouth Cipro and Flagyl with resolution of her symptoms. She currently denies any GI complaints and is eating well with regular bowel movements and no systemic complaints such as fever, chills, mouth sores, swollen joints, rashes etc. She has no past history of gastrointestinal or general medical problems. She denies use of alcohol, cigarettes, or NSAIDs. The patient denies gynecologic problems but is apparently one week late on her period. She specifically denies dyspepsia, reflux symptoms, or any history of known gallbladder or liver disease. Lab review shows slight leukocytosis with white count 13,900, normal hemoglobin, normal metabolic profile.  I have reviewed this patient's present history, medical and surgical past history, allergies and medications.     ROS: The remainder of the 10 point ROS is negative  No Known Allergies Outpatient Prescriptions Prior to Visit  Medication Sig Dispense Refill  . Probiotic Product (MISC INTESTINAL FLORA REGULAT) CHEW [employer] [employer] probiotic gummies by Schiff    0  . [DISCONTINUED] ciprofloxacin (CIPRO) 500 MG tablet Take 1 tablet (500 mg total) by mouth every 12 (twelve) hours.  10 tablet  0  . [DISCONTINUED] HYDROcodone-acetaminophen (NORCO) 5-325 MG per tablet Take 1 tablet by mouth every 6 (six) hours as needed for pain.  30 tablet  0  . [DISCONTINUED] metroNIDAZOLE (FLAGYL) 500 MG tablet Take 1 tablet (500 mg total) by mouth 2 (two) times daily.  14 tablet  0  . [DISCONTINUED] ondansetron (ZOFRAN) 4 MG tablet Take 1 tablet (4 mg total) by mouth every  6 (six) hours.  30 tablet  0   Last reviewed on 11/29/2011  2:34 PM by Sable Feil, MD Past Medical History  Diagnosis Date  . Chicken pox as a child  . Colitis 11/06/2011  . Preventative health care 11/06/2011   Past Surgical History  Procedure Date  . Wisdom tooth extraction 26 yrs old   History   Social History  . Marital Status: Single    Spouse Name: N/A    Number of Children: N/A  . Years of Education: N/A   Occupational History  . teacher    Social History Main Topics  . Smoking status: Never Smoker   . Smokeless tobacco: Never Used  . Alcohol Use: Yes     Comment: occasionaly  . Drug Use: No  . Sexually Active: Yes -- Female partner(s)   Other Topics Concern  . None   Social History Narrative  . None   Family History  Problem Relation Age of Onset  . Nephrolithiasis Father   . Lung cancer Maternal Grandmother   . Gout Maternal Grandfather   . Ovarian cancer Paternal Grandmother   . Pancreatic cancer Paternal Grandfather         Physical Exam: Blood pressure 100/60, pulse 60 and regular, and weight 121 with a BMI of 21.17. General well developed well nourished patient in no acute distress, appearing their stated age Eyes PERRLA, no icterus, fundoscopic exam per opthamologist Skin no lesions noted Neck supple, no adenopathy, no thyroid enlargement, no tenderness Chest clear to percussion and auscultation Heart no significant murmurs, gallops or rubs noted Abdomen no hepatosplenomegaly noted. There  is an ill-defined mass in the right lower quadrant which is slightly tender and easily movable. Her bowel sounds are nonobstructive. Rectal inspection normal no fissures, or fistulae noted.  No masses or tenderness on digital exam. Stool guaiac positive. Extremities no acute joint lesions, edema, phlebitis or evidence of cellulitis. Neurologic patient oriented x 3, cranial nerves intact, no focal neurologic deficits noted. Psychological mental status  normal and normal affect.  Assessment and plan: Probable Crohn's disease with recent abdominal pain perhaps related to either partial bowel obstruction or fistulization. I am surprised that this patient does not have more symptomatology, but she definitely has right lower quadrant inflammatory ill-defined mass and guaiac positive stools. I have set her up for colonoscopy with propofol sedation. I have ordered repeat CBC, CRP, sedimentation rate. She really does not have diarrhea or any symptoms of malabsorption. She is finished her course of antibiotic therapy, and is currently tolerating a regular diet. I tried to impress upon this patient the need for further GI evaluation ASAP, and I suspect she may have fistulizing inflammatory bowel disease.  Encounter Diagnoses  Name Primary?  . Abdominal mass, right lower quadrant Yes  . Heme positive stool

## 2011-12-01 ENCOUNTER — Telehealth: Payer: Self-pay | Admitting: *Deleted

## 2011-12-01 NOTE — Telephone Encounter (Signed)
lmom for pt to call me back; need to move her her COLON to 3pm.

## 2011-12-01 NOTE — Telephone Encounter (Signed)
Spoke with pt's sister Vernie Shanks who stated pt is willing to move her appt up tomorrow to 3pm. We went over the prep instructions and she stated understanding with the new times. Pt will call if pt has any problems tonight or in am.

## 2011-12-02 ENCOUNTER — Other Ambulatory Visit: Payer: Self-pay | Admitting: *Deleted

## 2011-12-02 ENCOUNTER — Ambulatory Visit (AMBULATORY_SURGERY_CENTER): Payer: BC Managed Care – PPO | Admitting: Gastroenterology

## 2011-12-02 ENCOUNTER — Other Ambulatory Visit: Payer: BC Managed Care – PPO

## 2011-12-02 ENCOUNTER — Encounter: Payer: Self-pay | Admitting: Gastroenterology

## 2011-12-02 ENCOUNTER — Encounter: Payer: BC Managed Care – PPO | Admitting: Gastroenterology

## 2011-12-02 VITALS — BP 104/64 | HR 77 | Temp 98.0°F | Resp 15 | Ht 60.0 in | Wt 121.0 lb

## 2011-12-02 DIAGNOSIS — K5000 Crohn's disease of small intestine without complications: Secondary | ICD-10-CM

## 2011-12-02 DIAGNOSIS — R1900 Intra-abdominal and pelvic swelling, mass and lump, unspecified site: Secondary | ICD-10-CM

## 2011-12-02 DIAGNOSIS — D1330 Benign neoplasm of unspecified part of small intestine: Secondary | ICD-10-CM

## 2011-12-02 DIAGNOSIS — R933 Abnormal findings on diagnostic imaging of other parts of digestive tract: Secondary | ICD-10-CM

## 2011-12-02 DIAGNOSIS — R1903 Right lower quadrant abdominal swelling, mass and lump: Secondary | ICD-10-CM

## 2011-12-02 DIAGNOSIS — R195 Other fecal abnormalities: Secondary | ICD-10-CM

## 2011-12-02 MED ORDER — SODIUM CHLORIDE 0.9 % IV SOLN: 500.0000 mL | INTRAVENOUS | Status: DC

## 2011-12-02 NOTE — Op Note (Signed)
[facility]  [address] [place], [address]   COLONOSCOPY PROCEDURE REPORT  PATIENT: Carla, Clark  MR#: [id number] BIRTHDATE: [birth year] , 26  yrs. old GENDER: Female ENDOSCOPIST: Sable Feil, MD, Marval Regal REFERRED BY:  Willette Alma, M.D. PROCEDURE DATE:  12/02/2011 PROCEDURE:   Colonoscopy with biopsy ASA CLASS:   Class II INDICATIONS:an abnormal CT. MEDICATIONS: Propofol (Diprivan) 650 mg IV  DESCRIPTION OF PROCEDURE:   After the risks and benefits and of the procedure were explained, informed consent was obtained.  A digital rectal exam revealed no abnormalities of the rectum.    The LB CF-[id number]  endoscope was introduced through the anus and advanced to the cecum, which was identified by both the appendix and ileocecal valve .  The quality of the prep was excellent, using MoviPrep .  The instrument was then slowly withdrawn as the colon was fully examined.     COLON FINDINGS: The colon mucosa was otherwise normal. Retroflexed views revealed no abnormalities.     The scope was then withdrawn from the patient and the procedure completed. Despite multiple attempts,could not enter IC valve...biopsies of edge of valve....  COMPLICATIONS: There were no complications. ENDOSCOPIC IMPRESSION: The colon mucosa was otherwise normal ..Marland Kitchenpatient has suspected Crohn's disease of ileal area...  RECOMMENDATIONS: 1.  Await pathology results 2.   CT enteroscopy... 3. Consider biologic Rx.  REPEAT EXAM:  cc:  _______________________________ eSignedSable Feil, MD, [facility] 12/02/2011 3:42 PM

## 2011-12-02 NOTE — Patient Instructions (Addendum)
YOU HAD AN ENDOSCOPIC PROCEDURE TODAY AT [facility]: Refer to the procedure report that was given to you for any specific questions about what was found during the examination.  If the procedure report does not answer your questions, please call your gastroenterologist to clarify.  If you requested that your care partner not be given the details of your procedure findings, then the procedure report has been included in a sealed envelope for you to review at your convenience later.  YOU SHOULD EXPECT: Some feelings of bloating in the abdomen. Passage of more gas than usual.  Walking can help get rid of the air that was put into your GI tract during the procedure and reduce the bloating. If you had a lower endoscopy (such as a colonoscopy or flexible sigmoidoscopy) you may notice spotting of blood in your stool or on the toilet paper. If you underwent a bowel prep for your procedure, then you may not have a normal bowel movement for a few days.  DIET: Your first meal following the procedure should be a light meal and then it is ok to progress to your normal diet.  A half-sandwich or bowl of soup is an example of a good first meal.  Heavy or fried foods are harder to digest and may make you feel nauseous or bloated.  Likewise meals heavy in dairy and vegetables can cause extra gas to form and this can also increase the bloating.  Drink plenty of fluids but you should avoid alcoholic beverages for 24 hours.  ACTIVITY: Your care partner should take you home directly after the procedure.  You should plan to take it easy, moving slowly for the rest of the day.  You can resume normal activity the day after the procedure however you should NOT DRIVE or use heavy machinery for 24 hours (because of the sedation medicines used during the test).    SYMPTOMS TO REPORT IMMEDIATELY: A gastroenterologist can be reached at any hour.  During normal business hours, 8:30 AM to 5:00 PM Monday through Friday,  call [phone].  After hours and on weekends, please call the GI answering service at [phone] who will take a message and have the physician on call contact you.   Following lower endoscopy (colonoscopy or flexible sigmoidoscopy):  Excessive amounts of blood in the stool  Significant tenderness or worsening of abdominal pains  Swelling of the abdomen that is new, acute  Fever of 100F or higher FOLLOW UP: If any biopsies were taken you will be contacted by phone or by letter within the next 1-3 weeks.  Call your gastroenterologist if you have not heard about the biopsies in 3 weeks.  Our staff will call the home number listed on your records the next business day following your procedure to check on you and address any questions or concerns that you may have at that time regarding the information given to you following your procedure. This is a courtesy call and so if there is no answer at the home number and we have not heard from you through the emergency physician on call, we will assume that you have returned to your regular daily activities without incident.  SIGNATURES/CONFIDENTIALITY: You and/or your care partner have signed paperwork which will be entered into your electronic medical record.  These signatures attest to the fact that that the information above on your After Visit Summary has been reviewed and is understood.  Full responsibility of the confidentiality of this discharge  information lies with you and/or your care-partner.   Resume medications. Office will notify you with appt.

## 2011-12-02 NOTE — Progress Notes (Signed)
Patient did not experience any of the following events: a burn prior to discharge; a fall within the facility; wrong site/side/patient/procedure/implant event; or a hospital transfer or hospital admission upon discharge from the facility. (G8907) Patient did not have preoperative order for IV antibiotic SSI prophylaxis. (G8918)  

## 2011-12-05 ENCOUNTER — Telehealth: Payer: Self-pay | Admitting: *Deleted

## 2011-12-05 DIAGNOSIS — R933 Abnormal findings on diagnostic imaging of other parts of digestive tract: Secondary | ICD-10-CM

## 2011-12-05 NOTE — Telephone Encounter (Signed)
'   Follow up Call-  Call back number 12/02/2011  Post procedure Call Back phone  # [phone]  Permission to leave phone message Yes     Patient questions:  Left message to call us if necessary.

## 2011-12-05 NOTE — Telephone Encounter (Signed)
lmom for pt to call back. Scheduled pt for CT Enteroscopy on 12/07/11, be there at 1:45pm for a 3pm scan; NPO 4 hrs prior and she will drink 3 bottles of contrast at CT. Need to know when her last period was or else she needs another pregnancy test.

## 2011-12-06 LAB — IBD EXPANDED PANEL
ACCA: 41 units (ref 0–90)
ALCA: 21 units (ref 0–60)
AMCA: 42 units (ref 0–100)
Atypical pANCA: NEGATIVE
gASCA: 64 units — ABNORMAL HIGH (ref 0–50)

## 2011-12-06 NOTE — Telephone Encounter (Signed)
lmom for pt to call back

## 2011-12-07 ENCOUNTER — Other Ambulatory Visit: Payer: BC Managed Care – PPO

## 2011-12-14 ENCOUNTER — Encounter: Payer: Self-pay | Admitting: Gastroenterology

## 2011-12-21 ENCOUNTER — Ambulatory Visit (INDEPENDENT_AMBULATORY_CARE_PROVIDER_SITE_OTHER)
Admission: RE | Admit: 2011-12-21 | Discharge: 2011-12-21 | Disposition: A | Discharge status: Home / Self Care | Payer: BC Managed Care – PPO | Source: Ambulatory Visit | Attending: Gastroenterology | Admitting: Gastroenterology

## 2011-12-21 DIAGNOSIS — R933 Abnormal findings on diagnostic imaging of other parts of digestive tract: Secondary | ICD-10-CM

## 2011-12-21 MED ORDER — IOHEXOL 300 MG/ML  SOLN
100.0000 mL | Freq: Once | INTRAMUSCULAR | Status: AC | PRN
  Administered 2011-12-21: 100 mL via INTRAVENOUS

## 2011-12-26 NOTE — Telephone Encounter (Signed)
Spoke with pt on 12/21/11 and discussed CT enteroscopy results.

## 2011-12-27 ENCOUNTER — Ambulatory Visit (INDEPENDENT_AMBULATORY_CARE_PROVIDER_SITE_OTHER): Payer: BC Managed Care – PPO | Admitting: Gastroenterology

## 2011-12-27 ENCOUNTER — Encounter: Payer: Self-pay | Admitting: Gastroenterology

## 2011-12-27 VITALS — BP 104/64 | HR 82 | Ht 63.0 in | Wt 122.4 lb

## 2011-12-27 DIAGNOSIS — K529 Noninfective gastroenteritis and colitis, unspecified: Secondary | ICD-10-CM

## 2011-12-27 DIAGNOSIS — R1031 Right lower quadrant pain: Secondary | ICD-10-CM

## 2011-12-27 DIAGNOSIS — K5289 Other specified noninfective gastroenteritis and colitis: Secondary | ICD-10-CM

## 2011-12-27 NOTE — Patient Instructions (Signed)
Please follow up in six weeks with Dr. Sharlett Iles

## 2011-12-27 NOTE — Progress Notes (Signed)
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  She was treated with by mouth Cipro and Flagyl and had improvement in her lower abdominal pain, in fact his had no further GI problems.  Colonoscopy to the cecum was unremarkable, but I could not intubate her ileocecal valve.  Biopsy showed nonspecific changes.  I reviewed her CT enteroscopy  today with Dr. Henrene Pastor.  There seems to be resolving ileitis in the ileal area without any definite evidence of abscess or fistulization.  Review of her lab data shows a normal CRP, and Crohn's disease serologies were negative except for a gASCA  of 64, normal 0-50.  The patient continues to be asymptomatic without abdominal pain, diarrhea, but occasionally have some hemorrhoidal fresh blood.  Her appetite is good her weight is stable.  The patient does have occasional postprandial acid reflux.  Currently she does take probiotic therapy.  Current Medications, Allergies, Past Medical History, Past Surgical History, Family History and Social History were reviewed in [employer] record.  Pertinent Review of Systems Negative... no skin rashes, joint pains, oral stomatitis, fever or chills.  She does not take NSAIDs, and apparently is not on birth control pills.  She denies gynecologic problems.   Physical Exam: Healthy-appearing patient in no acute distress.  Blood pressure 104/64, pulse 82 and regular, and weight 122 pounds with a BMI of 21.68.  There is no hepatosplenomegaly, but there is still a fullness in the right lower quadrant which is diminished in size.  This is tender to deep palpation.  Bowel sounds are nonobstructive in nature.  Rectal exam is deferred.  Mental status is normal.  No evidence of chronic liver disease, swollen joints or skin rashes.  Mental status is normal.  No Known Allergies Outpatient Prescriptions Prior to Visit   Medication Sig Dispense Refill  . Probiotic Product (MISC INTESTINAL FLORA REGULAT) [employer] probiotic gummies by Schiff    0   Last reviewed on 12/27/2011  4:54 PM by Sable Feil, MD Past Medical History  Diagnosis Date  . Chicken pox as a child  . Colitis 11/06/2011  . Preventative health care 11/06/2011   Past Surgical History  Procedure Date  . Wisdom tooth extraction 26 yrs old   History   Social History  . Marital Status: Single    Spouse Name: N/A    Number of Children: N/A  . Years of Education: N/A   Occupational History  . teacher    Social History Main Topics  . Smoking status: Never Smoker   . Smokeless tobacco: Never Used  . Alcohol Use: Yes     Comment: occasionaly  . Drug Use: No  . Sexually Active: Yes -- Female partner(s)   Other Topics Concern  . None   Social History Narrative  . None   Family History  Problem Relation Age of Onset  . Nephrolithiasis Father   . Lung cancer Maternal Grandmother   . Gout Maternal Grandfather   . Ovarian cancer Paternal Grandmother   . Pancreatic cancer Paternal Grandfather        Assessment and Plan: Resolving ileitis without any definitive treatment except for initial Cipro and Flagyl therapy.  I suspect this patient does have a low-grade inflammatory bowel disease, and I will recheck her again in 6 weeks' time with physical examination.  She is to call sooner should she have any  worsening of her pain or other symptoms of IBD.  I have advised her use when necessary Pepcid AC for acid reflux. No diagnosis found.

## 2012-01-03 ENCOUNTER — Ambulatory Visit: Payer: BC Managed Care – PPO | Admitting: Gastroenterology

## 2012-01-25 DIAGNOSIS — E559 Vitamin D deficiency, unspecified: Secondary | ICD-10-CM

## 2012-01-25 HISTORY — DX: Vitamin D deficiency, unspecified: E55.9

## 2012-01-27 ENCOUNTER — Ambulatory Visit (INDEPENDENT_AMBULATORY_CARE_PROVIDER_SITE_OTHER): Payer: BC Managed Care – PPO | Admitting: Family Medicine

## 2012-01-27 ENCOUNTER — Encounter: Payer: Self-pay | Admitting: Family Medicine

## 2012-01-27 VITALS — BP 108/69 | HR 74 | Temp 98.8°F | Ht 63.0 in | Wt 123.4 lb

## 2012-01-27 DIAGNOSIS — D649 Anemia, unspecified: Secondary | ICD-10-CM | POA: Insufficient documentation

## 2012-01-27 DIAGNOSIS — Z0000 Encounter for general adult medical examination without abnormal findings: Secondary | ICD-10-CM

## 2012-01-27 DIAGNOSIS — K529 Noninfective gastroenteritis and colitis, unspecified: Secondary | ICD-10-CM

## 2012-01-27 DIAGNOSIS — Z124 Encounter for screening for malignant neoplasm of cervix: Secondary | ICD-10-CM

## 2012-01-27 DIAGNOSIS — K5289 Other specified noninfective gastroenteritis and colitis: Secondary | ICD-10-CM

## 2012-01-27 DIAGNOSIS — Z309 Encounter for contraceptive management, unspecified: Secondary | ICD-10-CM

## 2012-01-27 HISTORY — DX: Encounter for screening for malignant neoplasm of cervix: Z12.4

## 2012-01-27 MED ORDER — DESOGESTREL-ETHINYL ESTRADIOL 0.15-0.02/0.01 MG (21/5) PO TABS: 1.0000 | ORAL_TABLET | Freq: Every day | ORAL | Status: DC

## 2012-01-27 NOTE — Assessment & Plan Note (Signed)
Encouraged heart healthy diet and increased iron, good hydration and regular exercise

## 2012-01-27 NOTE — Assessment & Plan Note (Signed)
Doing much better, colonoscopy and labs nonspecific for cause. Did have 1 episode of RLQ abdominal pain after eating almonds but resolved quickly and has not recurred again. Has been tolerating probiotics

## 2012-01-27 NOTE — Assessment & Plan Note (Signed)
No history of sexual activity but has a long term partner she is ready to become active with discussed risks and benefits and patient is going to start [place] on first Sunday after next cycle. Return in 6 months for pelvic and pap or sooner as needed.

## 2012-01-27 NOTE — Patient Instructions (Addendum)
Oral Contraception Use Oral contraceptives (OCs) are medicines taken to prevent pregnancy. OCs work by preventing the ovaries from releasing eggs. The hormones in OCs also cause the cervical mucus to thicken, preventing the sperm from entering the uterus. The hormones also cause the uterine lining to become thin, not allowing a fertilized egg to attach to the inside of the uterus. OCs are highly effective when taken exactly as prescribed. However, OCs do not prevent sexually transmitted diseases (STDs). Safe sex practices, such as using condoms along with an OC, can help prevent STDs.  Before taking OCs, you may have a physical exam and Pap test. Your caregiver may also order blood tests if necessary. Your caregiver will make sure you are a good candidate for oral contraception. Discuss with your caregiver the possible side effects of the OC you may be prescribed. When starting an OC, it can take 2 to 3 months for the body to adjust to the changes in hormone levels in your body.  HOW TO TAKE ORAL CONTRACEPTIVES Your caregiver may advise you on how to start taking the first cycle of OCs. Otherwise, you can:  Start on day 1 of your menstrual period. You will not need any backup contraceptive protection with this start time.  Start on the first Sunday after your menstrual period or the day you get your prescription. In these cases, you will need to use backup contraceptive protection for the first 7-day cycle. After you have started taking OCs:  If you forget to take 1 pill, take it as soon as you remember. Take the next pill at the regular time.  If you miss 2 or more pills, use backup birth control until your next menstrual period starts.  If you use a 28-day pack that contains inactive pills and you miss 1 of the last 7 pills (pills with no hormones), it will not matter. Throw away the rest of the non-hormone pills and start a new pill pack. No matter which day you start the OC, you will always start  a new pack on that same day of the week. Have an extra pack of OCs and a backup contraceptive method available in case you miss some pills or lose your OC pack. HOME CARE INSTRUCTIONS   Do not smoke.  Always use a condom to protect against STDs. OCs do not protect against STDs.  Use a calendar to mark your menstrual period days.  Read the information and directions that come with your OC. Talk to your caregiver if you have questions. SEEK MEDICAL CARE IF:   You develop nausea and vomiting.  You have abnormal vaginal discharge or bleeding.  You develop a rash.  You miss your menstrual period.  You are losing your hair.  You need treatment for mood swings or depression.  You get dizzy when taking the OC.  You develop acne from taking the OC.  You become pregnant. SEEK IMMEDIATE MEDICAL CARE IF:   You develop chest pain.  You develop shortness of breath.  You have an uncontrolled or severe headache.  You develop numbness or slurred speech.  You develop visual problems.  You develop pain, redness, and swelling in the legs. Document Released: 12/30/2010 Document Revised: 04/04/2011 Document Reviewed: 12/30/2010 [facility] Patient Information 2013 [place].

## 2012-01-27 NOTE — Progress Notes (Signed)
Patient ID: Carla Clark, female   DOB: [birth year], 27 y.o.   MRN: [id number] Carla Clark [id number] Nov 16, 1985 01/27/2012      Progress Note-Follow Up  Subjective  Chief Complaint  Chief Complaint  Patient presents with  . Follow-up    2 month    HPI  Patient is a 27 -year-old Caucasian female who's here today for followup. Overall she's feeling much better and has not had significant colitis. Take 1 episode of right lower quadrant pain on New Year's Day after eating some almonds and has had no further pain since then. She moves her bowels regularly. Denies any nausea, vomiting, fevers, anorexia or other abdominal pain or chest pain, palpitations, sob or GU c/o. She is requesting initiation of OCPs as she is considering becoming active with long term boyfriend.  Past Medical History  Diagnosis Date  . Chicken pox as a child  . Colitis 11/06/2011  . Preventative health care 11/06/2011  . Anemia 01/27/2012  . Contraceptive management 01/27/2012    Past Surgical History  Procedure Date  . Wisdom tooth extraction 27 yrs old    Family History  Problem Relation Age of Onset  . Nephrolithiasis Father   . Lung cancer Maternal Grandmother   . Gout Maternal Grandfather   . Ovarian cancer Paternal Grandmother   . Pancreatic cancer Paternal Grandfather     History   Social History  . Marital Status: Single    Spouse Name: N/A    Number of Children: N/A  . Years of Education: N/A   Occupational History  . teacher    Social History Main Topics  . Smoking status: Never Smoker   . Smokeless tobacco: Never Used  . Alcohol Use: Yes     Comment: occasionaly  . Drug Use: No  . Sexually Active: Yes -- Female partner(s)   Other Topics Concern  . Not on file   Social History Narrative  . No narrative on file    Current Outpatient Prescriptions on File Prior to Visit  Medication Sig Dispense Refill  . Probiotic Product (MISC INTESTINAL FLORA REGULAT) CHEW [facility]  probiotic gummies by Schiff    0  . desogestrel-ethinyl estradiol (KARIVA) 0.15-0.02/0.01 MG (21/5) tablet Take 1 tablet by mouth daily.  1 Package  5    No Known Allergies  Review of Systems  Review of Systems  Constitutional: Negative for fever and malaise/fatigue.  HENT: Negative for congestion.   Eyes: Negative for discharge.  Respiratory: Negative for shortness of breath.   Cardiovascular: Negative for chest pain, palpitations and leg swelling.  Gastrointestinal: Positive for abdominal pain. Negative for nausea and diarrhea.       RLQ pain has resolved  Genitourinary: Negative for dysuria.  Musculoskeletal: Negative for falls.  Skin: Negative for rash.  Neurological: Negative for loss of consciousness and headaches.  Endo/Heme/Allergies: Negative for polydipsia.  Psychiatric/Behavioral: Negative for depression and suicidal ideas. The patient is not nervous/anxious and does not have insomnia.     Objective  BP 108/69  Pulse 74  Temp 98.8 F (37.1 C) (Temporal)  Ht 5' 3"  (1.6 m)  Wt 123 lb 6.4 oz (55.974 kg)  BMI 21.86 kg/m2  SpO2 99%  LMP 01/22/2012  Physical Exam  Physical Exam  Constitutional: She is oriented to person, place, and time and well-developed, well-nourished, and in no distress. No distress.  HENT:  Head: Normocephalic and atraumatic.  Eyes: Conjunctivae normal are normal.  Neck: Neck supple. No thyromegaly present.  Cardiovascular: Normal  rate, regular rhythm and normal heart sounds.   No murmur heard. Pulmonary/Chest: Effort normal and breath sounds normal. She has no wheezes.  Abdominal: She exhibits no distension and no mass.  Musculoskeletal: She exhibits no edema.  Lymphadenopathy:    She has no cervical adenopathy.  Neurological: She is alert and oriented to person, place, and time.  Skin: Skin is warm and dry. No rash noted. She is not diaphoretic.  Psychiatric: Memory, affect and judgment normal.    Lab Results  Component Value Date     TSH 1.06 11/29/2011   Lab Results  Component Value Date   WBC 7.1 11/29/2011   HGB 11.8* 11/29/2011   HCT 37.1 11/29/2011   MCV 81.8 11/29/2011   PLT 328.0 11/29/2011   Lab Results  Component Value Date   CREATININE 0.7 11/29/2011   BUN 8 11/29/2011   NA 136 11/29/2011   K 3.8 11/29/2011   CL 103 11/29/2011   CO2 26 11/29/2011   Lab Results  Component Value Date   ALT 18 11/29/2011   AST 18 11/29/2011   ALKPHOS 54 11/29/2011   BILITOT 0.4 11/29/2011     Assessment & Plan  Acute colitis Doing much better, colonoscopy and labs nonspecific for cause. Did have 1 episode of RLQ abdominal pain after eating almonds but resolved quickly and has not recurred again. Has been tolerating probiotics  Preventative health care Encouraged heart healthy diet and increased iron, good hydration and regular exercise  Contraceptive management No history of sexual activity but has a long term partner she is ready to become active with discussed risks and benefits and patient is going to start Carla Clark on first Sunday after next cycle. Return in 6 months for pelvic and pap or sooner as needed.

## 2012-02-16 ENCOUNTER — Ambulatory Visit: Payer: BC Managed Care – PPO | Admitting: Gastroenterology

## 2012-02-17 ENCOUNTER — Encounter: Payer: Self-pay | Admitting: Gastroenterology

## 2012-02-17 ENCOUNTER — Ambulatory Visit (INDEPENDENT_AMBULATORY_CARE_PROVIDER_SITE_OTHER): Payer: BC Managed Care – PPO | Admitting: Gastroenterology

## 2012-02-17 VITALS — BP 112/72 | HR 64 | Ht 63.0 in | Wt 124.2 lb

## 2012-02-17 DIAGNOSIS — K5000 Crohn's disease of small intestine without complications: Secondary | ICD-10-CM

## 2012-02-17 DIAGNOSIS — R1031 Right lower quadrant pain: Secondary | ICD-10-CM

## 2012-02-17 MED ORDER — BUDESONIDE 3 MG PO CP24: ORAL_CAPSULE | ORAL | Status: DC

## 2012-02-17 NOTE — Progress Notes (Signed)
This is a 27 year old Caucasian female with suspected ileitis.  She really has been in remission without any therapy, but now describes some discomfort in her right lower quadrant with some" swelling", also some occasional bright red blood in her stool.  She denies crampy abdominal pain, diarrhea, systemic complaints, upper GI or hepatobiliary symptoms.  She's not been on antibiotics or any other new medications.  Her appetite remains good her weight is stable.  She has not been a recent antibiotics.  She was apparently told that she had possible" chronic appendicitis".  Review of her labs and x-ray shows evidence of ileitis but no previous fistulization.  IBD inflammatory serologic parameters were weakly positive.  Current Medications, Allergies, Past Medical History, Past Surgical History, Family History and Social History were reviewed in [employer] record.  ROS: All systems were reviewed and are negative unless otherwise stated in the HPI.          Physical Exam: Healthy patient in no distress.  Blood pressure 112/72, pulse 64, and weight 124 with a BMI of 22.01.  There no stigmata of chronic liver disease.  The patient appears very healthy not ill.  Her abdomen shows no organomegaly, but there is some fullness in the right lower quadrant area over several centimeter segment which is minimally tender to touch.  Bowel sounds are nonobstructive.  Inspection the rectum and perirectal areas unremarkable.  Rectal exam shows no masses or tenderness with soft normal color stool which is guaiac negative.  Mental status is normal.    Assessment and Plan: Low-grade ileitis ,,, ie. Crohn's disease of the distal ileum. I have placed her on Entocort 9 mg a day and we'll see her back in 3 weeks' time for followup.  This patient may need to move quickly to biological therapy and oral clinical course.

## 2012-02-17 NOTE — Patient Instructions (Addendum)
Please take Entocort three tablets by mouth once daily for three weeks. Samples were given today. Please call back after finishing medication to let us know how you are doing.

## 2012-04-20 ENCOUNTER — Encounter: Payer: Self-pay | Admitting: Family Medicine

## 2012-04-20 ENCOUNTER — Ambulatory Visit (INDEPENDENT_AMBULATORY_CARE_PROVIDER_SITE_OTHER): Payer: BC Managed Care – PPO | Admitting: Family Medicine

## 2012-04-20 ENCOUNTER — Other Ambulatory Visit: Payer: Self-pay | Admitting: Family Medicine

## 2012-04-20 ENCOUNTER — Ambulatory Visit
Admission: RE | Admit: 2012-04-20 | Discharge: 2012-04-20 | Disposition: A | Discharge status: Home / Self Care | Payer: BC Managed Care – PPO | Source: Ambulatory Visit | Attending: Family Medicine | Admitting: Family Medicine

## 2012-04-20 VITALS — BP 116/81 | HR 96 | Temp 98.6°F | Ht 63.0 in | Wt 124.8 lb

## 2012-04-20 DIAGNOSIS — N649 Disorder of breast, unspecified: Secondary | ICD-10-CM

## 2012-04-20 DIAGNOSIS — N63 Unspecified lump in unspecified breast: Secondary | ICD-10-CM

## 2012-04-20 NOTE — Patient Instructions (Addendum)
Avoid Round up, glycophosphate  Fibrocystic Breast Changes Fibrocystic breast changes is a non-cancerous(benign) condition that about half of all women have at some time in their life. It is also called benign breast disease and mammary dysplasia. It may also be called fibrocystic breast disease, but it is not really a disease. It is a common condition that occurs when women go through the hormonal changes during their menstrual cycle, between the ages of 55 to 5. Menopausal women do not have this problem, unless they are on hormone therapy. It can affect one or both breasts. This is not a sign that you will later get cancer. CAUSES  Overgrowth of cells lining the milk ducts, or enlarged lobules in the breast, cause the breast duct to become blocked. The duct then fills up with fluid. This is like a small balloon filled with water. It is called a cyst. Over time, with repeated inflammation there is a tendency to form scar tissue. This scar tissue becomes the fibrous part of fibrocystic disease. The exact cause of this happening is not known, but it may be related to the female hormones, estrogen and progesterone. Heredity (genetics) may also be a factor in some cases. SYMPTOMS   Tenderness.  Swelling.  Rope-like feeling.  Lumpy breast, one or both sides.  Changes in the size of the breasts, before and after the menstrual period (larger before, smaller after).  Green or dark brown nipple discharge (not blood). Symptoms are usually worse before periods (menstrual cycle) and get better toward the end of menstruation. Usually, it is temporary minor discomfort. But some women have severe pain.  DIAGNOSIS  Check your breasts monthly. The best time to check your breasts is after your period. If you check them during your period, you are more likely to feel the normal glands enlarged, as a result of the hormonal changes that happen right before your period. If you do not have menstrual periods, check  your breasts the first day of every month. Become familiar with the way your own breasts feel. It is then easier to notice if there are changes, such as more tenderness, a new growth, change in breast size, or a change in a lump that has always been there. All breasts lumps need to be investigated, to rule out breast cancer. See your caregiver as soon as possible, if you find a lump. Most breast lumps are not cancerous. Excellent treatment is available for ones that are.  To make a diagnosis, your caregiver will examine your breasts and may recommend other tests, such as:  Mammogram (breast X-ray).  Ultrasound.  MRI (magnetic resonance imaging).  Removing fluid from the cyst with a fine needle, under local anesthesia (aspiration).  Taking a breast tissue sample (breast biopsy). Some questions your caregiver will ask are:  What was the date of your last period?  When did the lump show up?  Is there any discharge from your breast?  Is the breast tender or painful?  Are the symptoms in one or both breasts?  Has the lump changed in size from month-to-month? How long has it been present?  Any family history of breast problems?  Any past breast problems?  Any history of breast surgery?  Are you taking any medications?  When was your last mammogram, and where was it done? TREATMENT   Dietary changes help to prevent or reduce the symptoms of fibrocystic breast changes.  You may need to stop consuming all foods that contain caffeine, such as chocolate,  sodas, coffee, and tea.  Reducing sugar and fat in your diet may also help.  Decrease estrogen in your diet. Some sources include commercially raised meats which contain estrogen. Eliminate other natural estrogens.  Birth control pills can also make symptoms worse.  Natural progesterone cream, applied at a dose of 15 to 20 milligrams per day, from ovulation until a day or two before your period returns, may help with returning to  normal breast tissue over several months. Seek advice from your caregiver.  Over-the-counter pain pills may help, as recommended by your caregiver.  Danazol hormone (female-like hormone) is sometimes used. It may cause hair growth and acne.  Needle aspiration can be used, to remove fluid from the cyst.  Surgery may be needed, to remove a large, persistent, and tender cyst.  Evening primrose oil may help with the tenderness and pain. It has linolenic acid that women may not have enough of. HOME CARE INSTRUCTIONS   Examine your breasts after every menstrual period.  If you do not have menstrual periods, examine your breasts the first day of every month.  Wear a firm support bra, especially when exercising.  Decrease or avoid caffeine in your diet.  Decrease the fat and sugar in your diet.  Eat a balanced diet.  Try to see your caregiver after you have a menstrual period.  Before seeing your caregiver, make notes about:  When you have the symptoms.  What types of symptoms you are having.  Medications you are taking.  When and where your last mammogram was taken.  Past breast problems or breast surgery. SEEK MEDICAL CARE IF:   You have been diagnosed with fibrocystic breast changes, and you develop changes in your breast:  Discharge from the nipple, especially bloody discharge.  Pain in the breast that does not go away after your menstrual period.  New lumps or bumps in the breast.  Lumps in your armpit.  Your breast or breasts become enlarged, red, and painful.  You find an isolated lump, even if it is not tender.  You have questions about this condition that have not been answered. Document Released: 10/27/2005 Document Revised: 04/04/2011 Document Reviewed: 01/21/2009 [facility] Patient Information 2013 [place].

## 2012-04-21 ENCOUNTER — Encounter: Payer: Self-pay | Admitting: Family Medicine

## 2012-04-21 DIAGNOSIS — N63 Unspecified lump in unspecified breast: Secondary | ICD-10-CM

## 2012-04-21 HISTORY — DX: Unspecified lump in unspecified breast: N63.0

## 2012-04-21 NOTE — Progress Notes (Signed)
Patient ID: Carla Clark, female   DOB: 22-Mar-1985, 27 y.o.   MRN: [id number] Carla Clark [id number] [birth year] 04/21/2012      Progress Note-Follow Up  Subjective  Chief Complaint  Chief Complaint  Patient presents with  . knot under arm    knot under right arm and one on right breast    HPI  Patient is a 27 year old Caucasian female who is in today to discuss a breast lesion. She was trying to address her mom noticed a lesion under her arm. The patient herself denies any pain or discomfort. Thinks maybe it had been present in the past but she is unsure. She also notes mild tenderness in her right breast but notes it is just 1-2 days before her menstrual cycle is supposed to occur and this is somewhat common for her. No other signs of illness. No chest pain or palpitations. No fatigue or anorexia. No GI or GU complaints today  Past Medical History  Diagnosis Date  . Chicken pox as a child  . Colitis 11/06/2011  . Preventative health care 11/06/2011  . Anemia 01/27/2012  . Contraceptive management 01/27/2012  . Breast mass in female 04/21/2012    Past Surgical History  Procedure Laterality Date  . Wisdom tooth extraction  27 yrs old    Family History  Problem Relation Age of Onset  . Nephrolithiasis Father   . Lung cancer Maternal Grandmother   . Gout Maternal Grandfather   . Ovarian cancer Paternal Grandmother   . Pancreatic cancer Paternal Grandfather     History   Social History  . Marital Status: Single    Spouse Name: N/A    Number of Children: N/A  . Years of Education: N/A   Occupational History  . teacher    Social History Main Topics  . Smoking status: Never Smoker   . Smokeless tobacco: Never Used  . Alcohol Use: Yes     Comment: occasionaly  . Drug Use: No  . Sexually Active: Yes -- Female partner(s)   Other Topics Concern  . Not on file   Social History Narrative  . No narrative on file    Current Outpatient Prescriptions on File Prior to  Visit  Medication Sig Dispense Refill  . desogestrel-ethinyl estradiol (KARIVA) 0.15-0.02/0.01 MG (21/5) tablet Take 1 tablet by mouth daily.  1 Package  5  . Probiotic Product (MISC INTESTINAL FLORA REGULAT) CHEW Digestive Health probiotic gummies by Schiff    0  . budesonide (ENTOCORT EC) 3 MG 24 hr capsule Please take three tablets by mouth once daily  66 capsule  0   No current facility-administered medications on file prior to visit.    No Known Allergies  Review of Systems  Review of Systems  Constitutional: Negative for fever and malaise/fatigue.  HENT: Negative for congestion.   Eyes: Negative for discharge.  Respiratory: Negative for shortness of breath.   Cardiovascular: Negative for chest pain, palpitations and leg swelling.  Gastrointestinal: Negative for nausea, abdominal pain and diarrhea.  Genitourinary: Negative for dysuria.  Musculoskeletal: Negative for falls.  Skin: Negative for rash.  Neurological: Negative for loss of consciousness and headaches.  Endo/Heme/Allergies: Negative for polydipsia.  Psychiatric/Behavioral: Negative for depression and suicidal ideas. The patient is not nervous/anxious and does not have insomnia.     Objective  BP 116/81  Pulse 96  Temp(Src) 98.6 F (37 C) (Temporal)  Ht 5' 3"  (1.6 m)  Wt 124 lb 12.8 oz (56.609 kg)  BMI 22.11 kg/m2  SpO2 99%  LMP 03/23/2012  Physical Exam  Physical Exam  Constitutional: She is oriented to person, place, and time and well-developed, well-nourished, and in no distress. No distress.  HENT:  Head: Normocephalic and atraumatic.  Eyes: Conjunctivae are normal.  Neck: Neck supple. No thyromegaly present.  Cardiovascular: Normal rate, regular rhythm and normal heart sounds.   No murmur heard. Pulmonary/Chest: Effort normal and breath sounds normal. She has no wheezes.  Abdominal: She exhibits no distension and no mass.  Musculoskeletal: She exhibits no edema.  Lymphadenopathy:    She has no  cervical adenopathy.  Neurological: She is alert and oriented to person, place, and time.  Skin: Skin is warm and dry. No rash noted. She is not diaphoretic.  Psychiatric: Memory, affect and judgment normal.    Lab Results  Component Value Date   TSH 1.06 11/29/2011   Lab Results  Component Value Date   WBC 7.1 11/29/2011   HGB 11.8* 11/29/2011   HCT 37.1 11/29/2011   MCV 81.8 11/29/2011   PLT 328.0 11/29/2011   Lab Results  Component Value Date   CREATININE 0.7 11/29/2011   BUN 8 11/29/2011   NA 136 11/29/2011   K 3.8 11/29/2011   CL 103 11/29/2011   CO2 26 11/29/2011   Lab Results  Component Value Date   ALT 18 11/29/2011   AST 18 11/29/2011   ALKPHOS 54 11/29/2011   BILITOT 0.4 11/29/2011     Assessment & Plan  Breast mass in female Small lesion in right breast, fibrocystic by exam ultrasound unremarkable. She report any conerning changes. Prominent vasculature in right axillae, no concerning lesions

## 2012-04-21 NOTE — Assessment & Plan Note (Signed)
Small lesion in right breast, fibrocystic by exam ultrasound unremarkable. She report any conerning changes. Prominent vasculature in right axillae, no concerning lesions

## 2012-07-09 ENCOUNTER — Ambulatory Visit: Payer: BC Managed Care – PPO | Admitting: Family Medicine

## 2012-07-24 DIAGNOSIS — Z789 Other specified health status: Secondary | ICD-10-CM

## 2012-07-24 HISTORY — DX: Other specified health status: Z78.9

## 2012-08-03 ENCOUNTER — Telehealth: Payer: Self-pay | Admitting: Gastroenterology

## 2012-08-03 NOTE — Telephone Encounter (Signed)
Pt was to f/u in 3 weeks after Entocort therapy, but she was not informed. She states she still has episodes of rectal bleeding and abdominal pain on and off, but not constant/continuous. She wanted a refill on Entocort, but I explained I could not do that without conferring with the doctor. Offered her an appt on Monday with a [facility] and she will call be back when she sees her schedule. Dr Sharlett Iles, continue Entocort or OV with PA? Thanks.

## 2012-08-06 NOTE — Telephone Encounter (Signed)
neeeds ov first

## 2012-08-06 NOTE — Telephone Encounter (Signed)
Informed pt Dr Sharlett Iles would like for her to come in. There were no appt on his schedule, so she will see Nicoletta Ba, PA in am

## 2012-08-07 ENCOUNTER — Encounter: Payer: Self-pay | Admitting: Physician Assistant

## 2012-08-07 ENCOUNTER — Other Ambulatory Visit (INDEPENDENT_AMBULATORY_CARE_PROVIDER_SITE_OTHER): Payer: BC Managed Care – PPO

## 2012-08-07 ENCOUNTER — Ambulatory Visit (INDEPENDENT_AMBULATORY_CARE_PROVIDER_SITE_OTHER): Payer: BC Managed Care – PPO | Admitting: Physician Assistant

## 2012-08-07 ENCOUNTER — Other Ambulatory Visit: Payer: Self-pay | Admitting: *Deleted

## 2012-08-07 VITALS — BP 90/60 | HR 87 | Ht 63.0 in | Wt 120.0 lb

## 2012-08-07 DIAGNOSIS — R1031 Right lower quadrant pain: Secondary | ICD-10-CM

## 2012-08-07 DIAGNOSIS — D649 Anemia, unspecified: Secondary | ICD-10-CM

## 2012-08-07 DIAGNOSIS — K5000 Crohn's disease of small intestine without complications: Secondary | ICD-10-CM

## 2012-08-07 DIAGNOSIS — K5090 Crohn's disease, unspecified, without complications: Secondary | ICD-10-CM | POA: Insufficient documentation

## 2012-08-07 DIAGNOSIS — R52 Pain, unspecified: Secondary | ICD-10-CM

## 2012-08-07 LAB — CBC WITH DIFFERENTIAL/PLATELET
Basophils Relative: 0.8 % (ref 0.0–3.0)
Eosinophils Relative: 3.8 % (ref 0.0–5.0)
Monocytes Relative: 13.6 % — ABNORMAL HIGH (ref 3.0–12.0)
Neutrophils Relative %: 64.2 % (ref 43.0–77.0)
Platelets: 399 10*3/uL (ref 150.0–400.0)
RBC: 4.44 Mil/uL (ref 3.87–5.11)
WBC: 6.8 10*3/uL (ref 4.5–10.5)

## 2012-08-07 LAB — HIGH SENSITIVITY CRP: CRP, High Sensitivity: 56.11 mg/L — ABNORMAL HIGH (ref 0.000–5.000)

## 2012-08-07 MED ORDER — BUDESONIDE 3 MG PO CP24: ORAL_CAPSULE | ORAL | Status: DC

## 2012-08-07 NOTE — Patient Instructions (Addendum)
Please go to the basement level to have your labs drawn.  Get multivitamin with B complex  At pharmacy over the counter. Also get Anusol cream over the counter as well.   We sent a prescription to [place], [place] for the Entocort.  I have also given you a RX relief Pharmacy Discount Card. Please give it to the pharmacist.  Please call me and let me know if this helped you and you were able to save some money with this card regarding the Entocort.  You should be able to use this card for any medications.  We made you an a follow up appointment with Dr. Sharlett Iles, 09-04-2012 at 10:30 am.

## 2012-08-07 NOTE — Progress Notes (Signed)
Subjective:    Patient ID: Carla Clark, female    DOB: 06-Nov-1985, 27 y.o.   MRN: [id number]  HPI  Carla Clark is a very nice 27 year old white female known to Dr. Sharlett Iles who was diagnosed with Crohn's ileitis in the fall of 2013. She had undergone colonoscopy in November of 2013 do to an abnormal CT scan and colonoscopy was normal to the cecum however despite multiple attempts the terminal ileum was unable to be intubated. Biopsies were done from the ileocecal valve and were benign. She then underwent CT and photography which showed evidence of distal and terminal ileitis. There was an area of distal ileal hypertension and that was felt possibly to extend exophytic lesion off the superior aspect of a small bowel loop. She also had IBD markers which were weakly positive. She was seen by Dr. Sharlett Iles in January of 2014 and at that time started on Entocort 9 mg daily area she says she was given samples and only continue the medicine through the end of the samples which was for about 3 week's worth. She says she wasn't feeling particularly bad at that time and did not feel that she needed more medicine . She comes in today stating that now over the past month she has been having more problems with abdominal pain and feels that a lot of foods seem to bother her stomach particularly spicy or fried foods. She complains of discomfort in the right mid and lower abdomen and says she gets a "knot-like" swelling and distention in her abdomen intermittently which seems to be worse after eating. Her weight has been stable her appetite has been fine she's not had any fever or chills. She has been more fatigued to this summer. She says her bowel movements have been normal occasionally cc a small amount of bright red blood on the tissue.    Review of Systems  Constitutional: Positive for fatigue.  HENT: Negative.   Eyes: Negative.   Respiratory: Negative.   Cardiovascular: Negative.   Gastrointestinal: Positive for  abdominal pain and blood in stool.  Endocrine: Negative.   Genitourinary: Negative.   Musculoskeletal: Negative.   Skin: Negative.   Allergic/Immunologic: Negative.   Neurological: Negative.   Hematological: Negative.   Psychiatric/Behavioral: Negative.    Outpatient Prescriptions Prior to Visit  Medication Sig Dispense Refill  . desogestrel-ethinyl estradiol (KARIVA) 0.15-0.02/0.01 MG (21/5) tablet Take 1 tablet by mouth daily.  1 Package  5  . Probiotic Product (MISC INTESTINAL FLORA REGULAT) CHEW Digestive Health probiotic gummies by Schiff    0  . budesonide (ENTOCORT EC) 3 MG 24 hr capsule Please take three tablets by mouth once daily  66 capsule  0   No facility-administered medications prior to visit.      No Known Allergies Patient Active Problem List   Diagnosis Date Noted  . Crohn's ileitis 08/07/2012  . Breast mass in female 04/21/2012  . Anemia 01/27/2012  . Contraceptive management 01/27/2012  . Preventative health care 11/06/2011   History  Substance Use Topics  . Smoking status: Never Smoker   . Smokeless tobacco: Never Used  . Alcohol Use: Yes     Comment: occasionaly    family history includes Gout in her maternal grandfather; Lung cancer in her maternal grandmother; Nephrolithiasis in her father; Ovarian cancer in her paternal grandmother; and Pancreatic cancer in her paternal grandfather.  Objective:   Physical Exam  well-developed young white female in no acute distress, pleasant blood pressure 90/60 pulse 87 height 5  foot 3 weight 120. HEENT; nontraumatic normocephalic EOMI PERRLA sclera anicteric, Neck;Supple no JVD, Cardiovascular; regular rate and rhythm with S1-S2 no murmur or gallop, Pulmonary; clear bilaterally, Abdomen ;soft ,bowel sounds are active she is tender in the right mid quadrant right lower quadrant there is some fullness in the right lower quadrant no guarding or rebound no definite palpable mass, Rectal ;exam external only no evidence of  fistula or external hemorrhoids she does have a ridge of mildly inflamed skin of the perineum. Extremities; no clubbing cyanosis or edema skin warm and dry, Psych; mood and affect normal and appropriate.        Assessment & Plan:  #63 27 year old white female with Crohn's ileitis diagnosed in November 2013 and not on any current therapy now presenting with postprandial mid right abdominal pain over the past month consistent with active ileitis. There was mention on CT enterography - of some exophytic distal ileal hyper-enhancement raising possibility of developing fistula  Plan; we'll check CBC with differential and CRP today We'll also draw a hepatitis B serologies in anticipation of possible need for biologic therapy in the near future We'll plan TB test later this summer Start Entocort 3 mg-3 tablets every morning for total of 9 mg daily she was given samples and a prescription was sent and she was asked to continue on this dose until she has return office visit in one month and call in the interim should she have any increase in discomfort If she does not have any significant improvement with the Entocort she will need repeat imaging to help determine appropriate treatment regimen She is also asked to start a multivitamin with folic acid and B vitamins

## 2012-08-08 LAB — HEPATITIS B CORE ANTIBODY, IGM: Hep B C IgM: NEGATIVE

## 2012-08-09 LAB — HEPATITIS B SURFACE ANTIBODY, QUANTITATIVE: Hepatitis B-Post: 170 m[IU]/mL

## 2012-08-09 LAB — HEPATITIS B SURFACE ANTIBODY,QUALITATIVE: Hep B S Ab: POSITIVE — AB

## 2012-08-20 ENCOUNTER — Other Ambulatory Visit (HOSPITAL_COMMUNITY)
Admission: RE | Admit: 2012-08-20 | Discharge: 2012-08-20 | Disposition: A | Discharge status: Home / Self Care | Payer: BC Managed Care – PPO | Source: Ambulatory Visit | Attending: Family Medicine | Admitting: Family Medicine

## 2012-08-20 ENCOUNTER — Ambulatory Visit (INDEPENDENT_AMBULATORY_CARE_PROVIDER_SITE_OTHER): Payer: BC Managed Care – PPO | Admitting: Family Medicine

## 2012-08-20 ENCOUNTER — Encounter: Payer: Self-pay | Admitting: Family Medicine

## 2012-08-20 ENCOUNTER — Telehealth: Payer: Self-pay

## 2012-08-20 VITALS — BP 118/72 | HR 81 | Temp 98.6°F | Ht 63.0 in | Wt 120.0 lb

## 2012-08-20 DIAGNOSIS — Z0000 Encounter for general adult medical examination without abnormal findings: Secondary | ICD-10-CM

## 2012-08-20 DIAGNOSIS — Z124 Encounter for screening for malignant neoplasm of cervix: Secondary | ICD-10-CM

## 2012-08-20 DIAGNOSIS — Z309 Encounter for contraceptive management, unspecified: Secondary | ICD-10-CM

## 2012-08-20 DIAGNOSIS — D649 Anemia, unspecified: Secondary | ICD-10-CM

## 2012-08-20 DIAGNOSIS — K5000 Crohn's disease of small intestine without complications: Secondary | ICD-10-CM

## 2012-08-20 DIAGNOSIS — Z01419 Encounter for gynecological examination (general) (routine) without abnormal findings: Secondary | ICD-10-CM | POA: Insufficient documentation

## 2012-08-20 LAB — CBC
HCT: 33.4 % — ABNORMAL LOW (ref 36.0–46.0)
RBC: 4.42 MIL/uL (ref 3.87–5.11)
RDW: 13.8 % (ref 11.5–15.5)
WBC: 6.4 10*3/uL (ref 4.0–10.5)

## 2012-08-20 LAB — RENAL FUNCTION PANEL
Albumin: 3.6 g/dL (ref 3.5–5.2)
CO2: 27 mEq/L (ref 19–32)
Calcium: 9.3 mg/dL (ref 8.4–10.5)
Creat: 0.62 mg/dL (ref 0.50–1.10)
Phosphorus: 2.9 mg/dL (ref 2.3–4.6)
Sodium: 141 mEq/L (ref 135–145)

## 2012-08-20 LAB — HEPATIC FUNCTION PANEL
AST: 11 U/L (ref 0–37)
Albumin: 3.6 g/dL (ref 3.5–5.2)
Bilirubin, Direct: 0.1 mg/dL (ref 0.0–0.3)
Total Bilirubin: 0.2 mg/dL — ABNORMAL LOW (ref 0.3–1.2)

## 2012-08-20 LAB — LIPID PANEL
HDL: 53 mg/dL (ref >39)
LDL Cholesterol: 59 mg/dL (ref 0–99)
Total CHOL/HDL Ratio: 2.4 Ratio
Triglycerides: 84 mg/dL (ref <150)

## 2012-08-20 LAB — TSH: TSH: 1.529 u[IU]/mL (ref 0.350–4.500)

## 2012-08-20 MED ORDER — DESOGESTREL-ETHINYL ESTRADIOL 0.15-0.02/0.01 MG (21/5) PO TABS: 1.0000 | ORAL_TABLET | Freq: Every day | ORAL | Status: DC

## 2012-08-20 NOTE — Assessment & Plan Note (Addendum)
Pap today, WNL

## 2012-08-20 NOTE — Telephone Encounter (Signed)
Message copied by Marlon Pel on Mon Aug 20, 2012  2:18 PM ------      Message from: Hulan Saas      Created: Tue Aug 07, 2012  4:55 PM       Did patient get labs for AE ------

## 2012-08-20 NOTE — Telephone Encounter (Signed)
Patient called to advise that she needs to come for lab work.  She states she will come on Wed.

## 2012-08-20 NOTE — Patient Instructions (Addendum)
Anusol suppository or cream Witch Hazel Astringent   Crohn's Disease Crohn's disease is a long-term (chronic) soreness and redness (inflammation) of the intestines (bowel). It can affect any portion of the digestive tract, from the mouth to the anus. It can also cause problems outside the digestive tract. Crohn's disease is closely related to a disease called ulcerative colitis (together, these two diseases are called inflammatory bowel disease).  CAUSES  The cause of Crohn's disease is not known. One Link Snuffer is that, in an easily affected (susceptible) person, the immune system is triggered to attack the body's own digestive tissue. Crohn's disease runs in families. It seems to be more common in certain geographic areas and amongst certain races. There are no clear-cut dietary causes.  SYMPTOMS  Crohn's disease can cause many different symptoms since it can affect many different parts of the body. Symptoms include:  Fatigue.  Weight loss.  Chronic diarrhea, sometime bloody.  Abdominal pain and cramps.  Fever.  Ulcers or canker sores in the mouth or rectum.  Anemia (low red blood cells).  Arthritis, skin problems, and eye problems may occur. Complications of Crohn's disease can include:  Series of holes (perforation) of the bowel.  Portions of the intestines sticking to each other (adhesions).  Obstruction of the bowel.  Fistula formation, typically in the rectal area but also in other areas. A fistula is an opening between the bowels and the outside, or between the bowels and another organ.  A painful crack in the mucous membrane of the anus (rectal fissure). DIAGNOSIS  Your caregiver may suspect Crohn's disease based on your symptoms and an exam. Blood tests may confirm that there is a problem. You may be asked to submit a stool specimen for examination. X-rays and CT scans may be necessary. Ultimately, the diagnosis is usually made after a procedure that uses a flexible tube  that is inserted via your mouth or your anus. This is done under sedation and is called either an upper endoscopy or colonoscopy. With these tests, the specialist can take tiny tissue samples and remove them from the inside of the bowel (biopsy). Examination of this biopsy tissue under a microscope can reveal Crohn's disease as the cause of your symptoms. Due to the many different forms that Crohn's disease can take, symptoms may be present for several years before a diagnosis is made. HOME CARE INSTRUCTIONS   There is no cure for Crohn's disease. The best treatment is frequent checkups with your caregiver.  Symptoms such as diarrhea can be controlled with medications. Avoid foods that have a laxative effect such as fresh fruit, vegetables and dairy products. During flare ups, you can rest your bowel by refraining from solid foods. Drink clear liquids frequently during the day (electrolyte or re-hydrating fluids are best. Your caregiver can help you with suggestions). Drink often to prevent loss of body fluids (dehydration). When diarrhea has cleared, eat small meals and more frequently. Avoid food additives and stimulants such as caffeine (coffee, tea, or chocolate). Enzyme supplements may help if you develop intolerance to a sugar in dairy products (lactose). Ask your caregiver or dietitian about specific dietary instructions.  Try to maintain a positive attitude. Learn relaxation techniques such as self hypnosis, mental imaging, and muscle relaxation.  If possible, avoid stresses which can aggravate your condition.  Exercise regularly.  Follow your diet.  Always get plenty of rest. SEEK MEDICAL CARE IF:   Your symptoms fail to improve after a week or two of new treatment.  You experience continued weight loss.  You have ongoing crampy digestion or loose bowels.  You develop a new skin rash, skin sores, or eye problems. SEEK IMMEDIATE MEDICAL CARE IF:   You have worsening of your  symptoms or develop new symptoms.  You have a fever.  You develop bloody diarrhea.  You develop severe abdominal pain. MAKE SURE YOU:   Understand these instructions.  Will watch your condition.  Will get help right away if you are not doing well or get worse. Document Released: 10/20/2004 Document Revised: 04/04/2011 Document Reviewed: 09/18/2006 [facility] Patient Information 2014 [place], [place].

## 2012-08-22 ENCOUNTER — Other Ambulatory Visit: Payer: Self-pay | Admitting: *Deleted

## 2012-08-22 ENCOUNTER — Ambulatory Visit: Payer: BC Managed Care – PPO

## 2012-08-22 DIAGNOSIS — D649 Anemia, unspecified: Secondary | ICD-10-CM

## 2012-08-22 DIAGNOSIS — D509 Iron deficiency anemia, unspecified: Secondary | ICD-10-CM

## 2012-08-22 LAB — IBC PANEL
Iron: 23 ug/dL — ABNORMAL LOW (ref 42–145)
Transferrin: 215 mg/dL (ref 212.0–360.0)

## 2012-08-22 NOTE — Progress Notes (Signed)
Patient ID: Carla Clark, female   DOB: 1985-03-17, 27 y.o.   MRN: [id number] Carla Clark [id number] [birth year] 08/22/2012      Progress Note-Follow Up  Subjective  Chief Complaint  Chief Complaint  Patient presents with  . Gynecologic Exam    pap    HPI  Patient is a 27 year old Caucasian female who is in today for her first annual Pap smear. Denies any GYN complaints. Denies any lesions discharge. She is active with a single partner but denies any concerns. He is having a flare in her Crohn's and is just been put on" which is helping. She notes numerous foods flares including tomatoes fried and spicy foods. She did work excessive hours last week and also feels that contributes. Denies any other recent illness, chest pain, palpitations, shortness of breath, or GU concerns.  Past Medical History  Diagnosis Date  . Chicken pox as a child  . Colitis 11/06/2011  . Preventative health care 11/06/2011  . Anemia 01/27/2012  . Contraceptive management 01/27/2012  . Breast mass in female 04/21/2012  . Crohn's disease   . Cervical cancer screening 01/27/2012    Past Surgical History  Procedure Laterality Date  . Wisdom tooth extraction  27 yrs old    Family History  Problem Relation Age of Onset  . Nephrolithiasis Father   . Lung cancer Maternal Grandmother   . Gout Maternal Grandfather   . Ovarian cancer Paternal Grandmother   . Cancer Paternal Grandmother     ovarian  . Pancreatic cancer Paternal Grandfather   . Cancer Paternal Aunt     ovarian    History   Social History  . Marital Status: Single    Spouse Name: N/A    Number of Children: N/A  . Years of Education: N/A   Occupational History  . teacher    Social History Main Topics  . Smoking status: Never Smoker   . Smokeless tobacco: Never Used  . Alcohol Use: Yes     Comment: occasionaly  . Drug Use: No  . Sexually Active: Yes -- Female partner(s)   Other Topics Concern  . Not on file   Social History  Narrative  . No narrative on file    Current Outpatient Prescriptions on File Prior to Visit  Medication Sig Dispense Refill  . budesonide (ENTOCORT EC) 3 MG 24 hr capsule Please take three tablets by mouth once daily  270 capsule  3  . Probiotic Product (MISC INTESTINAL FLORA REGULAT) CHEW Digestive Health probiotic gummies by Schiff    0   No current facility-administered medications on file prior to visit.    No Known Allergies  Review of Systems  Review of Systems  Constitutional: Negative for fever and malaise/fatigue.  HENT: Negative for congestion.   Eyes: Negative for pain and discharge.  Respiratory: Negative for shortness of breath.   Cardiovascular: Negative for chest pain, palpitations and leg swelling.  Gastrointestinal: Positive for abdominal pain and diarrhea. Negative for nausea, constipation, blood in stool and melena.  Genitourinary: Negative for dysuria.  Musculoskeletal: Negative for falls.  Skin: Negative for rash.  Neurological: Negative for loss of consciousness and headaches.  Endo/Heme/Allergies: Negative for polydipsia.  Psychiatric/Behavioral: Negative for depression and suicidal ideas. The patient is not nervous/anxious and does not have insomnia.     Objective  BP 118/72  Pulse 81  Temp(Src) 98.6 F (37 C) (Oral)  Ht 5' 3"  (1.6 m)  Wt 120 lb 0.6 oz (54.45 kg)  BMI 21.27  kg/m2  SpO2 98%  LMP 08/09/2012  Physical Exam  Physical Exam  Constitutional: She is oriented to person, place, and time and well-developed, well-nourished, and in no distress. No distress.  HENT:  Head: Normocephalic and atraumatic.  Eyes: Conjunctivae are normal.  Neck: Neck supple. No thyromegaly present.  Cardiovascular: Normal rate, regular rhythm and normal heart sounds.   No murmur heard. Pulmonary/Chest: Effort normal and breath sounds normal. She has no wheezes.  Abdominal: She exhibits no distension and no mass.  Genitourinary: Vagina normal, uterus normal,  cervix normal, right adnexa normal and left adnexa normal. No vaginal discharge found.  Musculoskeletal: She exhibits no edema.  Lymphadenopathy:    She has no cervical adenopathy.  Neurological: She is alert and oriented to person, place, and time.  Skin: Skin is warm and dry. No rash noted. She is not diaphoretic.  Psychiatric: Memory, affect and judgment normal.    Lab Results  Component Value Date   TSH 1.529 08/20/2012   Lab Results  Component Value Date   WBC 6.4 08/20/2012   HGB 10.6* 08/20/2012   HCT 33.4* 08/20/2012   MCV 75.6* 08/20/2012   PLT 473* 08/20/2012   Lab Results  Component Value Date   CREATININE 0.62 08/20/2012   BUN 8 08/20/2012   NA 141 08/20/2012   K 4.1 08/20/2012   CL 106 08/20/2012   CO2 27 08/20/2012   Lab Results  Component Value Date   ALT 11 08/20/2012   AST 11 08/20/2012   ALKPHOS 51 08/20/2012   BILITOT 0.2* 08/20/2012   Lab Results  Component Value Date   CHOL 129 08/20/2012   Lab Results  Component Value Date   HDL 53 08/20/2012   Lab Results  Component Value Date   LDLCALC 59 08/20/2012   Lab Results  Component Value Date   TRIG 84 08/20/2012   Lab Results  Component Value Date   CHOLHDL 2.4 08/20/2012     Assessment & Plan  Cervical cancer screening Pap today, WNL  Crohn's ileitis Is on Endocort, avoid offending foods, can try Anusol HC suppositories or cream, daily probiotics  Preventative health care Annual labs drawn, encouraged heart healthy diet and regular exercise.   Anemia Mild, encouraged MVI and recheck cbc in 1-2 months

## 2012-08-22 NOTE — Assessment & Plan Note (Signed)
Mild, encouraged MVI and recheck cbc in 1-2 months

## 2012-08-22 NOTE — Progress Notes (Signed)
Quick Note:  Patient Informed and voiced understanding ______ 

## 2012-08-22 NOTE — Assessment & Plan Note (Addendum)
Is on Endocort, avoid offending foods, can try Anusol HC suppositories or cream, daily probiotics

## 2012-08-22 NOTE — Assessment & Plan Note (Signed)
Annual labs drawn, encouraged heart healthy diet and regular exercise.

## 2012-08-23 ENCOUNTER — Other Ambulatory Visit: Payer: Self-pay | Admitting: Physician Assistant

## 2012-08-23 DIAGNOSIS — D649 Anemia, unspecified: Secondary | ICD-10-CM

## 2012-08-23 MED ORDER — INTEGRA PLUS PO CAPS: 1.0000 | ORAL_CAPSULE | Freq: Every day | ORAL | Status: DC

## 2012-08-31 ENCOUNTER — Telehealth: Payer: Self-pay | Admitting: *Deleted

## 2012-08-31 ENCOUNTER — Encounter: Payer: Self-pay | Admitting: Gastroenterology

## 2012-08-31 ENCOUNTER — Ambulatory Visit (INDEPENDENT_AMBULATORY_CARE_PROVIDER_SITE_OTHER): Payer: BC Managed Care – PPO | Admitting: Gastroenterology

## 2012-08-31 VITALS — BP 100/68 | HR 85 | Ht 63.0 in | Wt 116.2 lb

## 2012-08-31 DIAGNOSIS — K5090 Crohn's disease, unspecified, without complications: Secondary | ICD-10-CM

## 2012-08-31 MED ORDER — ADALIMUMAB 40 MG/0.8ML ~~LOC~~ KIT: PACK | SUBCUTANEOUS | Status: DC

## 2012-08-31 NOTE — Patient Instructions (Addendum)
  We have given you a TB skin test today. Please make certain to come back to the office for a reading between 48-72 hours from now to avoid requiring repeat testing.  Your physician has requested that you go to the basement for the following lab work before leaving today: CRP  Please continue Entocort until you start Humira   Dr. Buel Ream nurse Caren Griffins talked to you today about Humira   You have been scheduled for a CT Enteroscopy for 09-05-2012, please arrive at 8:45 am at [facility]. Nothing to eat or drink fours prior to appointment  _____________________________________________                                               We are excited to introduce MyChart, a new best-in-class service that provides you online access to important information in your electronic medical record. We want to make it easier for you to view your health information - all in one secure location - when and where you need it. We expect MyChart will enhance the quality of care and service we provide.  When you register for MyChart, you can:    View your test results.    Request appointments and receive appointment reminders via email.    Request medication renewals.    View your medical history, allergies, medications and immunizations.    Communicate with your physician's office through a password-protected site.    Conveniently print information such as your medication lists.  To find out if MyChart is right for you, please talk to a member of our clinical staff today. We will gladly answer your questions about this free health and wellness tool.  If you are age 69 or older and want a member of your family to have access to your record, you must provide written consent by completing a proxy form available at our office. Please speak to our clinical staff about guidelines regarding accounts for patients younger than age 72.  As you activate your MyChart account and need any technical  assistance, please call the MyChart technical support line at [phone]-CHART [phone]) or email your question to [email or web address]. If you email your question(s), please include your name, a return phone number and the best time to reach you.  If you have non-urgent health-related questions, you can send a message to our office through [place] at [email or web address]. If you have a medical emergency, call 911.  Thank you for using MyChart as your new health and wellness resource!   MyChart licensed from [employer],  1999-2010. Patents Pending.

## 2012-08-31 NOTE — Progress Notes (Addendum)
This is a very interesting 27 year old Caucasian female with  8-10 months of recurrent right lower quadrant pain with some initial diarrhea that has abated and relapsed on 2 different occasions..  She is felt to have Crohn's ileitis with previous CT enteroscopy last fall consistent with ileitis.  She responded initially to antibiotic therapy when seen in the ER, and then was seen for mild recurrence of ileitis in February that responded well to Entocort therapy.  One month ago she was seen by our 59 assistant with right lower quadrant pain and diarrhea and was placed on Entocort 9 mg a day.She had some right lower quadrant discomfort and mild diarrhea a month before this clinic visit.  She is somewhat better in terms of her diarrhea, but continues with some aching discomfort in her right lower quadrant without systemic complaints such as fever, chills, skin rash, joint pains, oral stomatitis.  Review of her record shows a recent elevated CRP of 56 !!!! With a previous normal CRP in November.Also  mild anemia and positive serologies for Crohn's disease.  Hematologic testing showed that she has been immunized for hepatitis B.  Patient currently denies bloody diarrhea, nausea vomiting, or cardiopulmonary symptoms.  Current Medications, Allergies, Past Medical History, Past Surgical History, Family History and Social History were reviewed in [employer] record.  ROS: All systems were reviewed and are negative unless otherwise stated in the HPI.          Physical Exam: Blood pressure 100/68, pulse 58 regular weight 116.  Chest clear and she is in a regular rhythm without murmurs gallops or rubs.  Her abdomen shows no organomegaly, but there is fullness in some tenderness in the right lower quadrant area without rebound tenderness.  Bowel sounds are active but nonobstructive.  Mental status is normal    Assessment and Plan: Crohn's disease with recent exacerbation and  possible fistula formation.  We will repeat her CRP, CT enteroscopy, apply PPD skin test, and we'll then start Humira induction therapy.  She is to continue her Entocort until we have biological therapy in progress.  Depending on her clinical course she may need referral to a tertiary [facility].  She is surprisingly fairly asymptomatic, and I do not think she needs acute prednisone therapy.  We will try to initiate Humira treatment ASAP and CT enterography.Copy Amy Esterwood,PA.

## 2012-08-31 NOTE — Telephone Encounter (Signed)
Faxed info for Humira start to [facility] [phone]

## 2012-09-03 LAB — TB SKIN TEST
Induration: 0 mm
TB Skin Test: NEGATIVE

## 2012-09-04 ENCOUNTER — Ambulatory Visit: Payer: BC Managed Care – PPO | Admitting: Gastroenterology

## 2012-09-04 NOTE — Telephone Encounter (Signed)
Faxed completed form for Humira to [facility] [phone]

## 2012-09-05 ENCOUNTER — Ambulatory Visit (HOSPITAL_COMMUNITY)
Admission: RE | Admit: 2012-09-05 | Discharge: 2012-09-05 | Disposition: A | Discharge status: Home / Self Care | Payer: BC Managed Care – PPO | Source: Ambulatory Visit | Attending: Gastroenterology | Admitting: Gastroenterology

## 2012-09-05 DIAGNOSIS — K630 Abscess of intestine: Secondary | ICD-10-CM | POA: Insufficient documentation

## 2012-09-05 DIAGNOSIS — K5090 Crohn's disease, unspecified, without complications: Secondary | ICD-10-CM

## 2012-09-05 MED ORDER — IOHEXOL 300 MG/ML  SOLN
100.0000 mL | Freq: Once | INTRAMUSCULAR | Status: AC | PRN
  Administered 2012-09-05: 100 mL via INTRAVENOUS

## 2012-09-05 MED ORDER — METRONIDAZOLE 500 MG PO TABS: 500.0000 mg | ORAL_TABLET | Freq: Two times a day (BID) | ORAL | Status: DC

## 2012-09-05 MED ORDER — CIPROFLOXACIN HCL 500 MG PO TABS: 500.0000 mg | ORAL_TABLET | Freq: Two times a day (BID) | ORAL | Status: DC

## 2012-09-05 NOTE — Telephone Encounter (Signed)
Caren Griffins- please call pt and let her know the CT scan shows very active Crohns that has progressed- she now has a Small abscess btween loops of bowel. She needs to start on Cipro and Flagyl 500 mg twice daily for each X 14 days. Take with food. Continue Entocort 9 mg daily. She will need to hold on humira until abscess better. Needs rov in 2 weeks me or DRP. She should call for any worsening of pain, fever etc/      Informed pt of the CT results and she has an abscess and she needs AB x 14 days. She will continue to take the Entocort and f/u with Dr Sharlett Iles on 10/02/12 because she needs a 4pm appt. She will call for worsening or questions.

## 2012-09-05 NOTE — Telephone Encounter (Signed)
Faxed more paperwork to [facility] [phone] as well as prior [place]

## 2012-09-12 NOTE — Telephone Encounter (Signed)
Faxed a signed order for Humira Starter and Mtce kits to [phone]. lmom for pt that she may be receiving a call from [facility], have the Humira delivered, out it in the fridge; [place]! Pt may call back for questions.

## 2012-10-02 ENCOUNTER — Ambulatory Visit (INDEPENDENT_AMBULATORY_CARE_PROVIDER_SITE_OTHER): Payer: BC Managed Care – PPO | Admitting: Gastroenterology

## 2012-10-02 ENCOUNTER — Encounter: Payer: Self-pay | Admitting: Gastroenterology

## 2012-10-02 ENCOUNTER — Other Ambulatory Visit (INDEPENDENT_AMBULATORY_CARE_PROVIDER_SITE_OTHER): Payer: BC Managed Care – PPO

## 2012-10-02 VITALS — BP 114/68 | HR 74 | Ht 63.0 in | Wt 116.1 lb

## 2012-10-02 DIAGNOSIS — K5000 Crohn's disease of small intestine without complications: Secondary | ICD-10-CM

## 2012-10-02 DIAGNOSIS — K5010 Crohn's disease of large intestine without complications: Secondary | ICD-10-CM

## 2012-10-02 NOTE — Progress Notes (Signed)
This is a very nice 27 year old Caucasian female with recurrent ileitis currently on Entocort 9 mg a day and was also completed a two-week course of initially Cipro 500 mg twice a day followed by metronidazole 500 mg twice a day.  She presented with recurrent right lower quadrant pain, this seems to have resolved, and she is having regular bowel movements without any gastrointestinal or general medical symptoms.  She specifically denies fever, chills, genitourinary problems, right upper quadrant pain, or any hepatobiliary symptoms.  Her appetite is good her weight is stable.  She's had no side effects from Entocort 9 mg a day.  Recent CRP was elevated at 56, and CT enteroscopy showed ileitis with a 2 cm mesenteric abscess 5 cm proximal to the ileocecal valve on 09/05/2012.  Plans had been initiated to begin Humira therapy, but were placed on hold pending her response to antibiotic therapy.  IBD serologies have shown a positive gASCA of 64,normal 0-50,and a negative ACCA,ALCA,AMCA,and pANA.  She has mild iron deficiency anemia but labs otherwise are been unremarkable.  Family history is noncontributory.  Her appetite is good her weight is stable.  Current Medications, Allergies, Past Medical History, Past Surgical History, Family History and Social History were reviewed in [employer] record.  ROS: All systems were reviewed and are negative unless otherwise stated in the HPI.          Physical Exam: Blood pressure 114/68, pulse 74 and regular and weight 116 with a BMI of 20.58.  Healthy-appearing patient in no acute distress.  There is no abdominal distention, organomegaly, masses or tenderness at this time.  Bowel sounds are normal.  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  I have ordered 6-MP enzyme metabolic studies, if these are normal, we'll  start 6-MP at conventional doses with usual CBC surveillance.  She would like to see Dr. Randalyn Rhea at [facility] department gastroenterology for second opinion before starting biologic therapy.  I think this is a good idea, and have encouraged this consultation, and for now we'll continue by mouth Entocort, and she is almost finished her po antibiotic therapy.  He had a long discussion concerning inflammatory bowel disease and possible future exacerbations and complications.  Once her 6-MP metabolic studies are available, we will call her to initiate low-dose immunosuppressive therapy.  She seems responsive to this idea.  Patient has done a lot of research on with [employer] and other Internet reviews.  CRP also ordered for review today.Marland Kitchen

## 2012-10-02 NOTE — Patient Instructions (Addendum)
Your physician has requested that you go to the basement for the following lab work before leaving today: CRP TPMT  Please follow up with Dr. Sharlett Iles in one month  You were scheduled with Mosie Epstein, MD at [facility] on Monday October 22, 2012 at 1:00 pm. Please be there fifteen minutes early for registration.   Address and phone number is listed below  [address]  [place], [place] [address]  [phone]

## 2012-10-22 ENCOUNTER — Telehealth: Payer: Self-pay | Admitting: Gastroenterology

## 2012-10-22 NOTE — Telephone Encounter (Signed)
Informed pt Dr Sharlett Iles had mentioned in his note from 10/02/12 that he wanted her to start on 6MP after her lab results were back. He also mentioned she wanted a 2nd opinion with Dr Cephas Darby at [facility]. Pt states she will see her today at 1pm. Reminded pt she has been approved for Humira; she will call if DR Cephas Darby wants anything started before her 11/02/12 f/u with Dr Sharlett Iles.

## 2012-10-30 ENCOUNTER — Telehealth: Payer: Self-pay | Admitting: *Deleted

## 2012-10-30 NOTE — Telephone Encounter (Signed)
lmom for pt to call back. Since Dr Sharlett Iles is retiring early next year, Dr Sharlett Iles would like for her to establish care with Dr Hilarie Fredrickson.

## 2012-11-01 NOTE — Telephone Encounter (Signed)
Left pt a message informing her we are cancelling her appt in am with Dr Sharlett Iles and we will schedule her with Dr Hilarie Fredrickson.

## 2012-11-01 NOTE — Telephone Encounter (Signed)
Informed pt her appt is cancelled with Dr Sharlett Iles tomorrow. Dr Sharlett Iles will be retiring early next year and he feels it best that she start with the same MD who will be handling her biologic therapy. Pt stated understanding and will see Dr Hilarie Fredrickson on 12/03/12

## 2012-11-02 ENCOUNTER — Ambulatory Visit: Payer: BC Managed Care – PPO | Admitting: Gastroenterology

## 2012-11-24 DIAGNOSIS — E538 Deficiency of other specified B group vitamins: Secondary | ICD-10-CM

## 2012-11-24 HISTORY — DX: Deficiency of other specified B group vitamins: E53.8

## 2012-11-26 ENCOUNTER — Telehealth: Payer: Self-pay | Admitting: *Deleted

## 2012-11-26 NOTE — Telephone Encounter (Signed)
Left a message for patient to call me. 

## 2012-11-26 NOTE — Telephone Encounter (Signed)
Message copied by Hulan Saas on Mon Nov 26, 2012 11:24 AM ------      Message from: HUNT, Virginia R      Created: Thu Aug 23, 2012 11:27 AM      Regarding: Labs       Pt needs repeat labs in November, orders in epic ------

## 2012-11-27 ENCOUNTER — Encounter: Payer: Self-pay | Admitting: Internal Medicine

## 2012-11-29 ENCOUNTER — Other Ambulatory Visit: Payer: Self-pay

## 2012-12-03 ENCOUNTER — Ambulatory Visit (INDEPENDENT_AMBULATORY_CARE_PROVIDER_SITE_OTHER): Payer: BC Managed Care – PPO | Admitting: Internal Medicine

## 2012-12-03 ENCOUNTER — Encounter: Payer: Self-pay | Admitting: Internal Medicine

## 2012-12-03 ENCOUNTER — Other Ambulatory Visit (INDEPENDENT_AMBULATORY_CARE_PROVIDER_SITE_OTHER): Payer: BC Managed Care – PPO

## 2012-12-03 VITALS — BP 90/68 | HR 90 | Ht 63.5 in | Wt 124.0 lb

## 2012-12-03 DIAGNOSIS — K5090 Crohn's disease, unspecified, without complications: Secondary | ICD-10-CM

## 2012-12-03 DIAGNOSIS — E611 Iron deficiency: Secondary | ICD-10-CM

## 2012-12-03 DIAGNOSIS — Z23 Encounter for immunization: Secondary | ICD-10-CM

## 2012-12-03 DIAGNOSIS — D509 Iron deficiency anemia, unspecified: Secondary | ICD-10-CM

## 2012-12-03 DIAGNOSIS — K5000 Crohn's disease of small intestine without complications: Secondary | ICD-10-CM

## 2012-12-03 LAB — CBC
HCT: 34.8 % — ABNORMAL LOW (ref 36.0–46.0)
Hemoglobin: 11.5 g/dL — ABNORMAL LOW (ref 12.0–15.0)
MCHC: 33 g/dL (ref 30.0–36.0)
MCV: 75.2 fl — ABNORMAL LOW (ref 78.0–100.0)
Platelets: 296 10*3/uL (ref 150.0–400.0)
RBC: 4.63 Mil/uL (ref 3.87–5.11)

## 2012-12-03 LAB — IBC PANEL
Iron: 56 ug/dL (ref 42–145)
Saturation Ratios: 13.3 % — ABNORMAL LOW (ref 20.0–50.0)
Transferrin: 300.7 mg/dL (ref 212.0–360.0)

## 2012-12-03 LAB — FERRITIN: Ferritin: 7.9 ng/mL — ABNORMAL LOW (ref 10.0–291.0)

## 2012-12-03 MED ORDER — PNEUMOCOCCAL VAC POLYVALENT 25 MCG/0.5ML IJ INJ: 0.5000 mL | INJECTION | Freq: Once | INTRAMUSCULAR | Status: DC

## 2012-12-03 NOTE — Progress Notes (Signed)
Patient ID: Carla Clark, female   DOB: 04/05/1985, 27 y.o.   MRN: [id number] HPI: Carla Clark is a very pleasant 27 yo female with PMH of ileal Crohn's disease with iron deficiency previously followed by Dr. Sharlett Iles who is transitioning care to my clinic and is seen for the first time today. She is here alone today. She was diagnosed with Crohn's disease in October 2013 and has been managed primarily with budesonide. She previously had a colonoscopy with Dr. Sharlett Iles on 12/02/2011 which showed a normal colon but the terminal ileum was unable to be intubated. She continued to have Crohn symptoms through most of early 2014 and then in August 2014 had a repeat CT scan which showed terminal ileitis and evidence of a small abscess. There was also question of an enteroenteric fistula.  She also has evidence of a skip lesion about 25 cm proximal to the IC valve. Biologic therapy was discussed and she saw Dr. Candis Shine at [facility] in early October for a second opinion. At this appointment Dr. Redmond Pulling recommended escalation of therapy given her significant Crohn's disease with possible fistula formation. Dr. Redmond Pulling suggested she begin Humira. She was initially reluctant due to the risk profile of this medication, specifically the mild increased risk of lymphoma. She does have a family history of cervix cancer, lung cancer and pancreatic cancer.  Today she reports she is "doing pretty good". She reports she has been "testing the limits" with her diet at that at times when she eats foods high in fiber spicy foods she will feel several hours to approximately one day of right lower quadrant "pressure". Occasionally she feels like this area is "swollen". She reports normal bowel movements occurring twice per day with formed brown stool. She denies nausea or vomiting. Heartburn only if she is tomatoes which is rare. She reports a better appetite and she has been able to gain weight. No fevers or chills. No rashes, joint pains,  complaints, or oral ulcers. She is getting married in 12 days and taking honeymoon with her husband to [place].  Of note she did complete a fairly long course of antibiotics for her small abscess in the right lower quadrant last antibiotics was in September 2014. She has remained on budesonide 9 mg daily since July 2014. She tried oral iron, but discontinued it due to constipation and overall "uncomfortable feeling".   Past Medical History  Diagnosis Date  . Chicken pox as a child  . Colitis 11/06/2011  . Preventative health care 11/06/2011  . Anemia 01/27/2012  . Contraceptive management 01/27/2012  . Breast mass in female 04/21/2012  . Crohn's disease   . Cervical cancer screening 01/27/2012    Past Surgical History  Procedure Laterality Date  . Wisdom tooth extraction  27 yrs old    Current Outpatient Prescriptions  Medication Sig Dispense Refill  . budesonide (ENTOCORT EC) 3 MG 24 hr capsule Please take three tablets by mouth once daily  270 capsule  3  . desogestrel-ethinyl estradiol (KARIVA) 0.15-0.02/0.01 MG (21/5) tablet Take 1 tablet by mouth daily.  3 Package  3  . FeFum-FePoly-FA-B Cmp-C-Biot (INTEGRA PLUS) CAPS Take 1 capsule by mouth daily.  30 capsule  2  . Probiotic Product (MISC INTESTINAL FLORA REGULAT) CHEW [facility] probiotic gummies by [employer]iff    0  . adalimumab (HUMIRA PEN STARTER) 40 MG/0.8ML injection Inject 160 mg or 4-0.14m pens subcutaneously on Week 0. Inject 80 mg or 2-0.8 ml pens subcutaneously for completion of Starter Pack  6 each  0  . adalimumab (HUMIRA PEN) 40 MG/0.8ML injection Inject one pen containing 0.8 ml subcutaneously every other week.  2 each  6   Current Facility-Administered Medications  Medication Dose Route Frequency Provider Last Rate Last Dose  . pneumococcal 23 valent vaccine (PNU-IMMUNE) injection 0.5 mL  0.5 mL Intramuscular Once Jerene Bears, MD        No Known Allergies  Family History  Problem Relation Age of Onset  .  Nephrolithiasis Father   . Lung cancer Maternal Grandmother   . Gout Maternal Grandfather   . Ovarian cancer Paternal Grandmother   . Cancer Paternal Grandmother     ovarian  . Pancreatic cancer Paternal Grandfather   . Cancer Paternal Aunt     ovarian    History  Substance Use Topics  . Smoking status: Never Smoker   . Smokeless tobacco: Never Used  . Alcohol Use: Yes     Comment: occasionaly    ROS: As per history of present illness, otherwise negative  BP 90/68  Pulse 90  Ht 5' 3.5" (1.613 m)  Wt 124 lb (56.246 kg)  BMI 21.62 kg/m2  LMP 11/26/2012 Constitutional: Well-developed and well-nourished. No distress. HEENT: Normocephalic and atraumatic. Oropharynx is clear and moist. No oropharyngeal exudate. Conjunctivae are normal.  No scleral icterus. Neck: Neck supple. Trachea midline. Cardiovascular: Normal rate, regular rhythm and intact distal pulses. No M/R/G Pulmonary/chest: Effort normal and breath sounds normal. No wheezing, rales or rhonchi. Abdominal: Soft, right lower quadrant tenderness with palpable fullness, nondistended. Bowel sounds active throughout.  No hepatosplenomegaly. Extremities: no clubbing, cyanosis, or edema Lymphadenopathy: No cervical adenopathy noted. Neurological: Alert and oriented to person place and time. Skin: Skin is warm and dry. No rashes noted. Psychiatric: Normal mood and affect. Behavior is normal.  RELEVANT LABS AND IMAGING: CBC    Component Value Date/Time   WBC 6.4 08/20/2012 1139   RBC 4.42 08/20/2012 1139   HGB 10.6* 08/20/2012 1139   HCT 33.4* 08/20/2012 1139   PLT 473* 08/20/2012 1139   MCV 75.6* 08/20/2012 1139   MCH 24.0* 08/20/2012 1139   MCHC 31.7 08/20/2012 1139   RDW 13.8 08/20/2012 1139   LYMPHSABS 1.2 08/07/2012 0942   MONOABS 0.9 08/07/2012 0942   EOSABS 0.3 08/07/2012 0942   BASOSABS 0.1 08/07/2012 0942    CMP     Component Value Date/Time   NA 141 08/20/2012 1139   K 4.1 08/20/2012 1139   CL 106 08/20/2012 1139    CO2 27 08/20/2012 1139   GLUCOSE 63* 08/20/2012 1139   BUN 8 08/20/2012 1139   CREATININE 0.62 08/20/2012 1139   CREATININE 0.7 11/29/2011 1430   CALCIUM 9.3 08/20/2012 1139   PROT 6.9 08/20/2012 1139   ALBUMIN 3.6 08/20/2012 1139   ALBUMIN 3.6 08/20/2012 1139   AST 11 08/20/2012 1139   ALT 11 08/20/2012 1139   ALKPHOS 51 08/20/2012 1139   BILITOT 0.2* 08/20/2012 1139   GFRNONAA >90 11/01/2011 0920   GFRAA >90 11/01/2011 0920   CT ABDOMEN AND PELVIS WITH CONTRAST (CT ENTEROGRAPHY) -- 09/05/2012    Technique:  Multidetector CT of the abdomen and pelvis during bolus administration of intravenous contrast. Negative oral contrast VoLumen was given.   Contrast: 135m OMNIPAQUE IOHEXOL 300 MG/ML  SOLN   Comparison:  12/21/2011   Findings: There is no abnormal inner wall or transmural enhancement in the stomach.  No gastric wall thickening.  The duodenum is normal.  Jejunal loops are not  well distended with oral contrast per otherwise normal in appearance.   In the right lower quadrant, abnormal small bowel loops are identified.  The 7-10 cm of distal ileum shows abnormal enhancement and the wall thickening.  This incorporates terminal ileum as it enters the cecum.   The distal ileum just proximal to the abnormal segment is dilated, indicating that the inflammatory changes in the terminal ileum create an area of inflammatory stricturing.   A short 3-5 cm segment of inner wall hyperenhancement is seen in the distal ileum about 25 cm proximal to the ileocecal valve.   A 2 cm abscess is identified in the mesentery of the right lower quadrant, arising from the terminal ileum, about 5 cm proximal to the ileocecal valve.  The abscess is tethered to the terminal ileum and is completely surrounded by a small bowel loops.   No abnormal enhancement or wall thickening in the colon.   The liver and spleen are unremarkable.  Pancreas, gallbladder, adrenal glands, and kidneys have normal imaging  features.  No abdominal aortic aneurysm.   Imaging through the pelvis shows a small amount of intraperitoneal free fluid.  Bladder and uterus are unremarkable.  No adnexal mass. Small lymph nodes are seen in the ileocolic mesentery.   Bone windows reveal no worrisome lytic or sclerotic osseous lesions.   IMPRESSION: Abnormal enhancement and wall thickening involving approximately 10 cm of the terminal ileum. There are enhancing tracks within or just superficial to the wall of the terminal ileum suggesting fistula formation and a 2 cm abscess cavity arises from the terminal ileum about 7 cm proximal to the ileocecal valve. It is possible that this abscess could be in part of entero-enteric fistula, but I can identify no bowel communication other than to the terminal ileum.   Inflammatory stricture identified approximately 10 cm from the ileocecal valve without signs of significant obstruction at this time.   Short segment of mucosal hyperenhancement approximately 25 cm proximal to the ileocecal valve.  This is compatible with a second area of small bowel involvement although there is no substantial wall thickening or inflammatory stricturing at this level.   folate normal B12 315 TSH normal PPD neg Aug 2014 Hep B surf Ab +, core neg, ag neg IBD panel -- consistent with Crohn's  ASSESSMENT/PLAN: 27 yo female with PMH of ileal Crohn's disease with iron deficiency previously followed by Dr. Sharlett Iles who is transitioning care to my clinic and is seen for the first time today.  1. Crohn's ileitis -- at present she has reached near clinical remission with budesonide 9 mg daily. Wet along discussion today regarding escalation of therapy and has gone over the risks, benefits, and expected outcomes with immunomodulators versus biologics versus combination.  We also discussed the risks in great detail including the risk of infection (including reactivation of latent TB and underlying  viral hepatitis), hepatotoxicity, leukopenia, pancreatitis, nausea, malignancy (specifically lymphoma), demyelinating disease, and even heart failure.  She has read about these medications, and is in agreement with starting Humira.  We will start with induction but wait until she returns from her honeymoon. I would like her to be on Humira therapy for about a month and we will try to wean off her budesonide slowly. For now she will continue budesonide 9 mg daily. I have recommended a low residue/fiber diet. She was given a copy of this diet today to take home. I would like to see her back in the office about 4-6 weeks after  starting Humira therapy.  --Hep B. Immune, core neg --PPD neg  2.  Iron deficiency -- she has had a mild anemia with iron deficiency. She has been intolerant to oral iron. She did try to take iron but was constipated and had abdominal discomfort. I would like to recheck CBC and iron studies today. She is remain low I will refer her for IV iron infusion  3.  Need for prophylactic vaccine -- I recommended influenza and pneumococcal vaccine today, she agrees and they will be given.

## 2012-12-03 NOTE — Patient Instructions (Signed)
Your physician has requested that you go to the basement for ab work before leaving today.  You have been given your flu vaccine and Pneumonia vaccine today.   You have been given a low fiber diet to follow.  Follow up with Dr. Hilarie Fredrickson in office in 10 weeks.   CONGRATULATIONS ON YOUR UP COMING NUPTIALS!!!                                                 We are excited to introduce MyChart, a new best-in-class service that provides you online access to important information in your electronic medical record. We want to make it easier for you to view your health information - all in one secure location - when and where you need it. We expect MyChart will enhance the quality of care and service we provide.  When you register for MyChart, you can:    View your test results.    Request appointments and receive appointment reminders via email.    Request medication renewals.    View your medical history, allergies, medications and immunizations.    Communicate with your physician's office through a password-protected site.    Conveniently print information such as your medication lists.  To find out if MyChart is right for you, please talk to a member of our clinical staff today. We will gladly answer your questions about this free health and wellness tool.  If you are age 25 or older and want a member of your family to have access to your record, you must provide written consent by completing a proxy form available at our office. Please speak to our clinical staff about guidelines regarding accounts for patients younger than age 25.  As you activate your MyChart account and need any technical assistance, please call the MyChart technical support line at [phone]) 83-CHART [phone]) or email your question to [email or web address]. If you email your question(s), please include your name, a return phone number and the best time to reach you.  If you have non-urgent health-related  questions, you can send a message to our office through [place] at [email or web address]. If you have a medical emergency, call 911.  Thank you for using MyChart as your new health and wellness resource!   MyChart licensed from [employer],  1999-2010. Patents Pending.

## 2012-12-04 LAB — HIGH SENSITIVITY CRP: CRP, High Sensitivity: 12.25 mg/L — ABNORMAL HIGH (ref 0.000–5.000)

## 2012-12-05 ENCOUNTER — Telehealth: Payer: Self-pay | Admitting: *Deleted

## 2012-12-05 DIAGNOSIS — K5000 Crohn's disease of small intestine without complications: Secondary | ICD-10-CM

## 2012-12-05 DIAGNOSIS — D509 Iron deficiency anemia, unspecified: Secondary | ICD-10-CM

## 2012-12-05 MED ORDER — VITAMIN D3 1.25 MG (50000 UT) PO CAPS: 1.0000 | ORAL_CAPSULE | ORAL | Status: DC

## 2012-12-05 MED ORDER — CYANOCOBALAMIN 500 MCG/0.1ML NA SOLN: 0.1000 mL | NASAL | Status: DC

## 2012-12-05 NOTE — Telephone Encounter (Signed)
Message copied by Lance Morin on Wed Dec 05, 2012  9:09 AM ------      Message from: Jerene Bears      Created: Tue Dec 04, 2012  4:34 PM       Iron levels are low, she was intolerant to oral iron, please refer to Dr. Beryle Beams or Marin Olp for IV iron.        B12 is low, given history of Crohn's would replace with intranasal B12      Vitamin D also, replace with 50,000 units every 7 days x 8 weeks, then 1000 IU by mouth daily      CRP has decreased but still remains elevated indicating improved but persistent inflammation      She remains mildly anemic, hemoglobin 11.5 ------

## 2012-12-05 NOTE — Telephone Encounter (Signed)
Informed pt of results and the meds ordered. Left her samples of Nascobal with a discount card. Informed her I will refer her to Hem/Onc for IV iron and she stated understanding. Referral made to Dr Marin Olp

## 2012-12-14 ENCOUNTER — Telehealth: Payer: Self-pay | Admitting: *Deleted

## 2012-12-14 DIAGNOSIS — K5090 Crohn's disease, unspecified, without complications: Secondary | ICD-10-CM

## 2012-12-14 MED ORDER — ADALIMUMAB 40 MG/0.8ML ~~LOC~~ KIT: PACK | SUBCUTANEOUS | Status: DC

## 2012-12-14 MED ORDER — ADALIMUMAB 40 MG/0.8ML ~~LOC~~ KIT
PACK | SUBCUTANEOUS | Status: DC
Start: 2012-12-14 — End: 2013-01-21

## 2012-12-14 NOTE — Telephone Encounter (Signed)
Spoke with [facility] and then [facility] to start pt's Humira therapy. The med had been approved in August, but pt asked for a 2nd opinion at [facility] and had an abscess that needed to heal. I will fax new scripts to [facility] to [phone]. Pharmacy phone [phone]

## 2012-12-28 ENCOUNTER — Telehealth: Payer: Self-pay | Admitting: Internal Medicine

## 2012-12-28 NOTE — Telephone Encounter (Signed)
Pt reports she will receive Humira tomorrow and we discussed a visit on Monday around 4 pm to help with injections.

## 2013-01-15 ENCOUNTER — Telehealth: Payer: Self-pay | Admitting: *Deleted

## 2013-01-15 NOTE — Telephone Encounter (Signed)
Pt reports she has been taking the Humira. She reports an Ambassador from [employer] helped with instructions. Encouraged pt to call for problems.

## 2013-01-15 NOTE — Telephone Encounter (Signed)
lmom for pt to call back

## 2013-01-18 ENCOUNTER — Telehealth: Payer: Self-pay | Admitting: Hematology & Oncology

## 2013-01-18 NOTE — Telephone Encounter (Signed)
Spoke w NEW PATIENT today to remind them of their appointment with Dr. Ennever. Also, advised them to bring all meds and insurance information. ° °

## 2013-01-21 ENCOUNTER — Telehealth: Payer: Self-pay | Admitting: Hematology & Oncology

## 2013-01-21 ENCOUNTER — Ambulatory Visit: Payer: BC Managed Care – PPO

## 2013-01-21 ENCOUNTER — Other Ambulatory Visit: Payer: BC Managed Care – PPO | Admitting: Lab

## 2013-01-21 ENCOUNTER — Ambulatory Visit (HOSPITAL_BASED_OUTPATIENT_CLINIC_OR_DEPARTMENT_OTHER): Payer: BC Managed Care – PPO | Admitting: Hematology & Oncology

## 2013-01-21 ENCOUNTER — Ambulatory Visit: Payer: BC Managed Care – PPO | Admitting: Hematology & Oncology

## 2013-01-21 ENCOUNTER — Encounter: Payer: Self-pay | Admitting: Hematology & Oncology

## 2013-01-21 ENCOUNTER — Other Ambulatory Visit (HOSPITAL_BASED_OUTPATIENT_CLINIC_OR_DEPARTMENT_OTHER): Payer: BC Managed Care – PPO | Admitting: Lab

## 2013-01-21 VITALS — BP 111/73 | HR 80 | Temp 98.2°F | Resp 12 | Ht 63.0 in | Wt 126.0 lb

## 2013-01-21 DIAGNOSIS — D649 Anemia, unspecified: Secondary | ICD-10-CM

## 2013-01-21 DIAGNOSIS — K5090 Crohn's disease, unspecified, without complications: Secondary | ICD-10-CM

## 2013-01-21 DIAGNOSIS — D508 Other iron deficiency anemias: Secondary | ICD-10-CM | POA: Insufficient documentation

## 2013-01-21 DIAGNOSIS — D509 Iron deficiency anemia, unspecified: Secondary | ICD-10-CM

## 2013-01-21 LAB — CBC WITH DIFFERENTIAL (CANCER CENTER ONLY)
BASO#: 0 10*3/uL (ref 0.0–0.2)
BASO%: 0.5 % (ref 0.0–2.0)
EOS%: 4.2 % (ref 0.0–7.0)
HCT: 38.3 % (ref 34.8–46.6)
HGB: 12.1 g/dL (ref 11.6–15.9)
LYMPH#: 2.1 10*3/uL (ref 0.9–3.3)
LYMPH%: 38.7 % (ref 14.0–48.0)
MCH: 25.5 pg — ABNORMAL LOW (ref 26.0–34.0)
MCHC: 31.6 g/dL — ABNORMAL LOW (ref 32.0–36.0)
MCV: 81 fL (ref 81–101)
MONO#: 0.5 10*3/uL (ref 0.1–0.9)
MONO%: 9.3 % (ref 0.0–13.0)
NEUT%: 47.3 % (ref 39.6–80.0)
Platelets: 272 10*3/uL (ref 145–400)
RDW: 14.7 % (ref 11.1–15.7)

## 2013-01-21 LAB — FERRITIN CHCC: Ferritin: 4 ng/ml — ABNORMAL LOW (ref 9–269)

## 2013-01-21 LAB — IRON AND TIBC CHCC
Iron: 52 ug/dL (ref 41–142)
TIBC: 476 ug/dL — ABNORMAL HIGH (ref 236–444)

## 2013-01-21 NOTE — Telephone Encounter (Signed)
Pt aware of 01-25-13 iron. Left Baxter Flattery note to precert

## 2013-01-21 NOTE — Progress Notes (Signed)
This office note has been dictated.

## 2013-01-21 NOTE — Addendum Note (Signed)
Addended by: Burney Gauze R on: 01/21/2013 04:27 PM   Modules accepted: Orders

## 2013-01-21 NOTE — Telephone Encounter (Signed)
Left pt message to call and set up appointment for either this week or next

## 2013-01-22 NOTE — Progress Notes (Signed)
CC:   Zenovia Jarred, MD  DIAGNOSES: 1. Iron-deficiency anemia. 2. Crohn disease.  HISTORY OF PRESENT ILLNESS:  [name] is a very charming 27 year old white female.  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  She just got married in November.  Husband is also a Art therapist.  She has Crohn disease.  This apparently was found a few years ago.  No biopsies were taken, but it sounds like this was discovered by scans.  She had a CT scan done on August 13th.  She had some abnormal enhancement and wall thickening involving proximal 10 cm of terminal ileum.  There was some enhancing tracts suggesting a fistula formation at 2 cm abscess cavity rising from the terminal ileum about 7 cm proximal to the ileocecal valve.  There was no perforation.  Liver and spleen were unremarkable.  There was no obvious lymphadenopathy.  There was a second area of enhancement about 25 cm from the ileocecal valve.  She sees Dr. Hilarie Fredrickson of Gastroenterology.  She has been started on Humira therapy.  She has iron deficiency.  She has had lab studies done back in July; her hemoglobin was 11.3, hematocrit was 34.4, platelet count 399.  MCV is 77.  A year ago, hemoglobin was 12 and hematocrit was 37.4.  In November, her CBC was done which showed a white cell count 6.9, hemoglobin 11.5, hematocrit 34.8, platelet count 296.  MCV was 75.  She was placed on some oral iron.  When she had iron studies done, her ferritin was only 8.  She had a mildly depressed B12 level of 200.  She was prescribed some intranasal B12, but she has not taken this yet.  She also was noted to have a low vitamin D level.  She does have her monthly cycles, but these do not appear to be all that heavy.  She has a very high C-reactive protein, which I suspect is from the Crohn's.  She says that when she takes the oral iron, it causes her issues, it causes her to "back up."  She was then  referred to the [facility] for an evaluation and to see if IV iron would be feasible.  Overall, she feels well.  She has had no significant fatigue.  She has had no cough or shortness of breath.  She has had no rashes.  There have been no arthralgias or myalgias.  She has had no dysphagia or odynophagia.  She has not noted any kind of rashes.  Of note, she and her husband were down in [place] for their honeymoon where she had a good time down there.  She ate well without any issues with nausea or vomiting.  Her overall performance status right now is ECOG 0.  PAST MEDICAL HISTORY:  Pretty much unremarkable.  ALLERGIES:  She has no known allergies.  MEDICATIONS: 1. Probiotic daily. 2. Humira 40 mg subcu every 2 weeks. 3. Oral contraceptive. 4. Budesonide 3 mg as needed for flare ups.  SOCIAL HISTORY:  Negative for tobacco use.  There is no real alcohol use.  She has no obvious occupational exposures.  FAMILY HISTORY:  Remarkable for having lung cancer in maternal grandmother.  There is an ovarian cancer in a paternal grandmother and paternal aunt.  There is also a history of pancreatic cancer in a paternal grandfather.  REVIEW OF SYSTEMS:  As stated in history of present illness.  No additional  findings are noted on a 12-system review.  PHYSICAL EXAMINATION:  General:  This is a well developed, well nourished, white female in no obvious distress.  Vital Signs:  Show a temperature of 98.2, pulse 88, respiratory rate 18, blood pressure 111/73, weight is 126 pounds.  Head and Neck:  Shows a normocephalic, atraumatic skull.  There are no ocular or oral lesions.  She has no palpable cervical or supraclavicular lymph nodes.  Lungs:  Clear bilaterally.  Cardiac:  Regular rate and rhythm with a normal S1 and S2. There are no murmurs, rubs, or bruits.  Abdomen:  Soft.  She has good bowel sounds.  There is no fluid wave.  There is no palpable abdominal mass.   There is no palpable hepatosplenomegaly, may be some slight tenderness in the right lower quadrant.  Back:  No tenderness over the spine, ribs, or hips.  Extremities:  Show no clubbing, cyanosis, or edema.  Skin:  Shows a fair skin.  She has no suspicious hyperpigmented lesions.  LABORATORY DATA:  White blood cell 5.5, hemoglobin 12.1, hematocrit 38.3, platelet count 272.  MCV is 81.  Peripheral smear shows mild anisocytosis and poikilocytosis.  There is no nucleated red cells.  I see no rouleaux formation.  There are no target cells.  I see no schistocytes or spherocytes.  There may be some slight polychromasia.  Her white cells appear normal in morphology and maturation.  There is no immature myeloid or lymphoid forms.  There are no hypersegmented polys.  I see no blasts.  Platelets are adequate in number and size.  IMPRESSION:  [name] is a very charming 27 year old white female. She has Crohn disease.  She has had some iron deficiency.  She is certainly not that anemic today.  I am sure that she probably will have some degree of iron deficiency, this is because of her monthly cycles.  We will see what her iron studies show.  IV iron should work very well for her.  Again, I would suspect that she will have some degree of iron deficiency, this is because of her monthly cycles.  I also need to make sure that B12 deficiency will not be a problem for her.  This also is seen with Crohn disease and inflammatory bowel disease.  She is given prescription for intranasal B12.  I encouraged her to take this.  I do not see anything on her blood smear that looked anything else but mild iron deficiency.  I do not see a need for a bone marrow test on her.  I do not see need for any other scans or x-rays on her.  For now, we will see what her iron studies show.  We will then plan her on a followup depending on her blood counts and her iron levels.  I spent a good 45 minutes or  so with her today.  Again, she is very, very nice.  We had a nice time talking about music.    ______________________________ Volanda Napoleon, M.D. PRE/MEDQ  D:  01/21/2013  T:  01/22/2013  Job:  [id number]

## 2013-01-23 LAB — LACTATE DEHYDROGENASE: LDH: 129 U/L (ref 94–250)

## 2013-01-23 LAB — HEMOGLOBINOPATHY EVALUATION
Hgb F Quant: 0 % (ref 0.0–2.0)
Hgb S Quant: 0 %

## 2013-01-25 ENCOUNTER — Ambulatory Visit (HOSPITAL_BASED_OUTPATIENT_CLINIC_OR_DEPARTMENT_OTHER): Payer: BC Managed Care – PPO

## 2013-01-25 VITALS — BP 114/74 | HR 89 | Temp 97.1°F | Resp 20

## 2013-01-25 DIAGNOSIS — D509 Iron deficiency anemia, unspecified: Secondary | ICD-10-CM

## 2013-01-25 DIAGNOSIS — D508 Other iron deficiency anemias: Secondary | ICD-10-CM

## 2013-01-25 MED ORDER — SODIUM CHLORIDE 0.9 % IV SOLN
Freq: Once | INTRAVENOUS | Status: AC
  Administered 2013-01-25: 13:00:00 via INTRAVENOUS

## 2013-01-25 MED ORDER — SODIUM CHLORIDE 0.9 % IV SOLN
1020.0000 mg | Freq: Once | INTRAVENOUS | Status: AC
  Administered 2013-01-25: 1020 mg via INTRAVENOUS
  Filled 2013-01-25: qty 34

## 2013-01-25 MED ORDER — SODIUM CHLORIDE 0.9 % IJ SOLN
10.0000 mL | INTRAMUSCULAR | Status: DC | PRN
  Filled 2013-01-25: qty 10

## 2013-01-25 MED ORDER — HEPARIN SOD (PORK) LOCK FLUSH 100 UNIT/ML IV SOLN
250.0000 [IU] | Freq: Once | INTRAVENOUS | Status: DC | PRN
  Filled 2013-01-25: qty 5

## 2013-01-25 NOTE — Patient Instructions (Signed)

## 2013-02-07 ENCOUNTER — Encounter: Payer: Self-pay | Admitting: Internal Medicine

## 2013-02-11 ENCOUNTER — Ambulatory Visit (INDEPENDENT_AMBULATORY_CARE_PROVIDER_SITE_OTHER): Payer: BC Managed Care – PPO | Admitting: Internal Medicine

## 2013-02-11 ENCOUNTER — Encounter: Payer: Self-pay | Admitting: Internal Medicine

## 2013-02-11 VITALS — BP 92/60 | HR 68 | Ht 63.0 in | Wt 129.0 lb

## 2013-02-11 DIAGNOSIS — K5000 Crohn's disease of small intestine without complications: Secondary | ICD-10-CM

## 2013-02-11 DIAGNOSIS — D508 Other iron deficiency anemias: Secondary | ICD-10-CM

## 2013-02-11 DIAGNOSIS — Z79899 Other long term (current) drug therapy: Secondary | ICD-10-CM

## 2013-02-11 NOTE — Progress Notes (Signed)
Subjective:    Patient ID: Carla Clark, female    DOB: January 17, 1986, 28 y.o.   MRN: [id number]  HPI Carla Clark is a very pleasant 28 yo female with PMH of ileal Crohn's disease with iron deficiency who is seen for followup. She is here alone today. Since last being seen here she got married, honeymooned in [place], started [place], and saw Dr. Marin Olp after which he received IV iron on 01/25/2013.  She reports she is feeling well. She has tolerated her Humira start very well without complaint. No injection site issues including no soreness, itching, erythema. Overall she reports no abdominal pain, bowel movements have remained normal 1-2 times daily. They're formed, brown and nonbloody and non-melenic. She is not taking oral iron as she received IV iron 2 weeks ago. She had one day in December when she developed right lower cord abdominal pain and pressure after eating kale.  She reports otherwise having avoided high fiber foods and cruciferous vegetables.  This lower abdominal pain lasted only one day and has not recurred. Energy level is good. No rashes, joint pains, eye complaints or oral ulcers.  She has gained weight about 15 pounds since late summer 2014  Her husband is helping with her Humira injections and Vicente Males, the [employer] is also involved  She has been off of budesonide and has " I have not felt like I needed it"   Review of Systems As per history of present illness, otherwise negative  Current Medications, Allergies, Past Medical History, Past Surgical History, Family History and Social History were reviewed in [facility] record.     Objective:   Physical Exam BP 92/60  Pulse 68  Ht 5' 3"  (1.6 m)  Wt 129 lb (58.514 kg)  BMI 22.86 kg/m2 Constitutional: Well-developed and well-nourished. No distress. HEENT: Normocephalic and atraumatic. Oropharynx is clear and moist. No oropharyngeal exudate. Conjunctivae are normal.  No scleral  icterus. Neck: Neck supple. Trachea midline. Cardiovascular: Normal rate, regular rhythm and intact distal pulses. No M/R/G Pulmonary/chest: Effort normal and breath sounds normal. No wheezing, rales or rhonchi. Abdominal: Soft, mild right lower cord or tenderness without rebound or guarding, nondistended. Bowel sounds active throughout. There are no masses palpable. Extremities: no clubbing, cyanosis, or edema Lymphadenopathy: No cervical adenopathy noted. Neurological: Alert and oriented to person place and time. Skin: Skin is warm and dry. No rashes noted. Psychiatric: Normal mood and affect. Behavior is normal.  CBC    Component Value Date/Time   WBC 5.5 01/21/2013 1204   WBC 6.9 12/03/2012 0953   RBC 4.63 12/03/2012 0953   HGB 12.1 01/21/2013 1204   HGB 11.5* 12/03/2012 0953   HCT 38.3 01/21/2013 1204   HCT 34.8* 12/03/2012 0953   PLT 272 01/21/2013 1204   PLT 296.0 12/03/2012 0953   MCV 81 01/21/2013 1204   MCV 75.2* 12/03/2012 0953   MCH 25.5* 01/21/2013 1204   MCH 24.0* 08/20/2012 1139   MCHC 31.6* 01/21/2013 1204   MCHC 33.0 12/03/2012 0953   RDW 14.7 01/21/2013 1204   RDW 18.1* 12/03/2012 0953   LYMPHSABS 2.1 01/21/2013 1204   LYMPHSABS 1.2 08/07/2012 0942   MONOABS 0.9 08/07/2012 0942   EOSABS 0.2 01/21/2013 1204   EOSABS 0.3 08/07/2012 0942   BASOSABS 0.0 01/21/2013 1204   BASOSABS 0.1 08/07/2012 0942    CMP     Component Value Date/Time   NA 141 08/20/2012 1139   K 4.1 08/20/2012 1139   CL 106  08/20/2012 1139   CO2 27 08/20/2012 1139   GLUCOSE 63* 08/20/2012 1139   BUN 8 08/20/2012 1139   CREATININE 0.62 08/20/2012 1139   CREATININE 0.7 11/29/2011 1430   CALCIUM 9.3 08/20/2012 1139   PROT 6.9 08/20/2012 1139   ALBUMIN 3.6 08/20/2012 1139   ALBUMIN 3.6 08/20/2012 1139   AST 11 08/20/2012 1139   ALT 11 08/20/2012 1139   ALKPHOS 51 08/20/2012 1139   BILITOT 0.2* 08/20/2012 1139   GFRNONAA >90 11/01/2011 0920   GFRAA >90 11/01/2011 0920    Iron/TIBC/Ferritin     Component Value Date/Time   IRON 52 01/21/2013 1204   IRON 56 12/03/2012 0953   TIBC 476* 01/21/2013 1204   FERRITIN 4* 01/21/2013 1204   FERRITIN 7.9* 12/03/2012 0953   Erythrocyte Sedimentation Rate     Component Value Date/Time   ESRSEDRATE 16 12/03/2012 0953   CRP 12.25 (nov 2014)    Assessment & Plan:  28 yo female with PMH of ileal Crohn's disease with iron deficiency who is seen for followup.  1.  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 She has been on Humira for 4 weeks and it seems to be doing well. We have discussed how 4 weeks is often too early for complete response and hopefully she will continue to feel even better. We have discussed how I do think she likely has some mild stricturing in her terminal ileum even if the inflammation has resolved. This is likely what caused symptoms after eating kale.  I have asked that she continue to follow a low residue diet for now. We will continue Humira 40 mg every other week. I will see her back in 3 months, sooner if necessary --vaccines -- up to date  2.  IDA -- iron deficiency being treated with IV iron. Dr. Marin Olp is following her labs. I appreciate his help  Return in 3 months with plan to check CRP

## 2013-02-11 NOTE — Patient Instructions (Signed)
Continue Humira   Continue low fiber low residue diet.  Follow up with Dr. Hilarie Fredrickson in office in 3 months                                               We are excited to introduce MyChart, a new best-in-class service that provides you online access to important information in your electronic medical record. We want to make it easier for you to view your health information - all in one secure location - when and where you need it. We expect MyChart will enhance the quality of care and service we provide.  When you register for MyChart, you can:    View your test results.    Request appointments and receive appointment reminders via email.    Request medication renewals.    View your medical history, allergies, medications and immunizations.    Communicate with your physician's office through a password-protected site.    Conveniently print information such as your medication lists.  To find out if MyChart is right for you, please talk to a member of our clinical staff today. We will gladly answer your questions about this free health and wellness tool.  If you are age 45 or older and want a member of your family to have access to your record, you must provide written consent by completing a proxy form available at our office. Please speak to our clinical staff about guidelines regarding accounts for patients younger than age 83.  As you activate your MyChart account and need any technical assistance, please call the MyChart technical support line at [phone]) or email your question to [email or web address]. If you email your question(s), please include your name, a return phone number and the best time to reach you.  If you have non-urgent health-related questions, you can send a message to our office through [facility] at [email or web address]. If you have a medical emergency, call 911.  Thank you for using MyChart as your new health and wellness  resource!   MyChart licensed from [employer],  1999-2010. Patents Pending.

## 2013-03-07 ENCOUNTER — Telehealth: Payer: Self-pay | Admitting: Hematology & Oncology

## 2013-03-07 ENCOUNTER — Encounter: Payer: Self-pay | Admitting: Hematology & Oncology

## 2013-03-07 ENCOUNTER — Other Ambulatory Visit (HOSPITAL_BASED_OUTPATIENT_CLINIC_OR_DEPARTMENT_OTHER): Payer: BC Managed Care – PPO | Admitting: Lab

## 2013-03-07 ENCOUNTER — Ambulatory Visit (HOSPITAL_BASED_OUTPATIENT_CLINIC_OR_DEPARTMENT_OTHER): Payer: BC Managed Care – PPO | Admitting: Hematology & Oncology

## 2013-03-07 VITALS — BP 102/55 | HR 65 | Temp 97.4°F | Resp 14 | Ht 63.0 in | Wt 133.0 lb

## 2013-03-07 DIAGNOSIS — D508 Other iron deficiency anemias: Secondary | ICD-10-CM

## 2013-03-07 DIAGNOSIS — D509 Iron deficiency anemia, unspecified: Secondary | ICD-10-CM

## 2013-03-07 DIAGNOSIS — K5090 Crohn's disease, unspecified, without complications: Secondary | ICD-10-CM

## 2013-03-07 DIAGNOSIS — D510 Vitamin B12 deficiency anemia due to intrinsic factor deficiency: Secondary | ICD-10-CM

## 2013-03-07 LAB — RETICULOCYTES (CHCC)
ABS Retic: 19.4 10*3/uL (ref 19.0–186.0)
RBC.: 4.85 MIL/uL (ref 3.87–5.11)
Retic Ct Pct: 0.4 % (ref 0.4–2.3)

## 2013-03-07 LAB — CBC WITH DIFFERENTIAL (CANCER CENTER ONLY)
BASO#: 0 10*3/uL (ref 0.0–0.2)
BASO%: 0.5 % (ref 0.0–2.0)
EOS%: 4 % (ref 0.0–7.0)
Eosinophils Absolute: 0.2 10*3/uL (ref 0.0–0.5)
HEMATOCRIT: 40.8 % (ref 34.8–46.6)
HGB: 13.2 g/dL (ref 11.6–15.9)
LYMPH#: 2.4 10*3/uL (ref 0.9–3.3)
LYMPH%: 41.2 % (ref 14.0–48.0)
MCH: 27.4 pg (ref 26.0–34.0)
MCHC: 32.4 g/dL (ref 32.0–36.0)
MCV: 85 fL (ref 81–101)
MONO#: 0.7 10*3/uL (ref 0.1–0.9)
MONO%: 12.3 % (ref 0.0–13.0)
NEUT#: 2.4 10*3/uL (ref 1.5–6.5)
NEUT%: 42 % (ref 39.6–80.0)
Platelets: 211 10*3/uL (ref 145–400)
RBC: 4.82 10*6/uL (ref 3.70–5.32)
RDW: 15.2 % (ref 11.1–15.7)
WBC: 5.8 10*3/uL (ref 3.9–10.0)

## 2013-03-07 LAB — CHCC SATELLITE - SMEAR

## 2013-03-07 NOTE — Telephone Encounter (Signed)
Pt made 4-6 instead of 3-26 due to work schedule

## 2013-03-11 NOTE — Progress Notes (Signed)
Hematology and Oncology Follow Up Visit  Carla Clark [id number] Nov 29, 1985 27 y.o. 03/11/2013   Principle Diagnosis:   Iron deficiency anemia  Crohn's disease  Current Therapy:   IV iron with Feraheme.     Interim History:  Ms.  Carla Clark is back for a second office visit. Refers Asarum by before new years. She has Crohn's disease. She does does not absorb iron. We did go ahead and give her some IV iron. She got a dose of Feraheme on January 2 of 1020 mg. We first saw her, her ferritin was only 4. Unfortunately cannot get any blood from her for iron studies today.  We saw her, her B12 was 309. She feels better. The iron so has helped her.  She's had no bleeding outside that with her cycle. She's had no leg swelling.  She does take her Humira for the Crohn's.  Medications: Current outpatient prescriptions:adalimumab (HUMIRA) 40 MG/0.8ML injection, Inject one- 50m/0.8ml pen into skin every 2 weeks as Maintenance Dose beginning at WEEK 6., Disp: , Rfl: ;  Cholecalciferol (VITAMIN D3) 50000 UNITS CAPS, Take 1 capsule by mouth once a week., Disp: 8 capsule, Rfl: 0;  Cyanocobalamin 500 MCG/0.1ML SOLN, Place 0.1 mLs into the nose once a week., Disp: , Rfl:  desogestrel-ethinyl estradiol (KARIVA) 0.15-0.02/0.01 MG (21/5) tablet, Take 1 tablet by mouth daily., Disp: 3 Package, Rfl: 3;  Probiotic Product (MISC INTESTINAL FLORA REGULAT) CHEW, Chew by mouth every other day. Digestive Health probiotic gummies by Schiff, Disp: , Rfl: ;  budesonide (ENTOCORT EC) 3 MG 24 hr capsule, Take 3 mg by mouth as needed., Disp: , Rfl:   Allergies: No Known Allergies  Past Medical History, Surgical history, Social history, and Family History were reviewed and updated.  Review of Systems: As above  Physical Exam:  height is 5' 3"  (1.6 m) and weight is 133 lb (60.328 kg). Her oral temperature is 97.4 F (36.3 C). Her blood pressure is 102/55 and her pulse is 65. Her respiration is 14.   3 well-nourished  white female in no obvious distress. Her head and neck exam shows no ocular or oral lesion. She is no palpable cervical or supra-clavicular lymph nodes. Lungs are clear. Cardiac exam regular in rhythm with no murmurs rubs or bruits. Abdomen is soft. She has good bowel sounds. There is no fluid wave. There is a palpable hepato- splenomegaly extremities shows no clubbing cyanosis or edema. Skin exam no rashes. Neurological exam no focal neurological deficits.  Lab Results  Component Value Date   WBC 5.8 03/07/2013   HGB 13.2 03/07/2013   HCT 40.8 03/07/2013   MCV 85 03/07/2013   PLT 211 03/07/2013     Chemistry      Component Value Date/Time   NA 141 08/20/2012 1139   K 4.1 08/20/2012 1139   CL 106 08/20/2012 1139   CO2 27 08/20/2012 1139   BUN 8 08/20/2012 1139   CREATININE 0.62 08/20/2012 1139   CREATININE 0.7 11/29/2011 1430      Component Value Date/Time   CALCIUM 9.3 08/20/2012 1139   ALKPHOS 51 08/20/2012 1139   AST 11 08/20/2012 1139   ALT 11 08/20/2012 1139   BILITOT 0.2* 08/20/2012 1139    On her hemoglobin electrophoresis, and she is a normal pattern.   On her blood smear, she has a normochromic normocytic publisher red blood cells. There is still some slight anisocytosis. There are no target cells. She has no rouleau formation. I see no teardrop  cells. Shows no spherocytes or schistocytes. White cells are normal in morphology maturation. There are no hypersegmented polys. Platelets are adequate.  Impression and Plan: [name] is a 42 are all white female. She has Crohn's disease. She does not absorb iron.  Of note, I had told her that she needs to be taken her intranasal B12. We'll daily orally will not work. She said that she will do this.  I want to see her back in about 6 weeks. I would think that her iron studies are good as her hemoglobin has improved and her MCV has increased.  We'll plan to get her back and check her iron studies in 6 weeks. I will also check her  B12.   Volanda Napoleon, MD 2/16/20155:14 PM

## 2013-04-29 ENCOUNTER — Other Ambulatory Visit (HOSPITAL_BASED_OUTPATIENT_CLINIC_OR_DEPARTMENT_OTHER): Payer: BC Managed Care – PPO | Admitting: Lab

## 2013-04-29 ENCOUNTER — Ambulatory Visit (HOSPITAL_BASED_OUTPATIENT_CLINIC_OR_DEPARTMENT_OTHER): Payer: BC Managed Care – PPO | Admitting: Hematology & Oncology

## 2013-04-29 ENCOUNTER — Encounter: Payer: Self-pay | Admitting: Hematology & Oncology

## 2013-04-29 VITALS — BP 112/70 | HR 68 | Temp 98.4°F | Resp 14 | Ht 63.0 in | Wt 136.0 lb

## 2013-04-29 DIAGNOSIS — D508 Other iron deficiency anemias: Secondary | ICD-10-CM

## 2013-04-29 DIAGNOSIS — D510 Vitamin B12 deficiency anemia due to intrinsic factor deficiency: Secondary | ICD-10-CM

## 2013-04-29 DIAGNOSIS — D649 Anemia, unspecified: Secondary | ICD-10-CM

## 2013-04-29 DIAGNOSIS — D509 Iron deficiency anemia, unspecified: Secondary | ICD-10-CM

## 2013-04-29 LAB — CBC WITH DIFFERENTIAL (CANCER CENTER ONLY)
BASO#: 0 10*3/uL (ref 0.0–0.2)
BASO%: 0.6 % (ref 0.0–2.0)
EOS%: 3 % (ref 0.0–7.0)
Eosinophils Absolute: 0.2 10*3/uL (ref 0.0–0.5)
HEMATOCRIT: 41.5 % (ref 34.8–46.6)
HGB: 13.8 g/dL (ref 11.6–15.9)
LYMPH#: 2.2 10*3/uL (ref 0.9–3.3)
LYMPH%: 40.3 % (ref 14.0–48.0)
MCH: 29.1 pg (ref 26.0–34.0)
MCHC: 33.3 g/dL (ref 32.0–36.0)
MCV: 88 fL (ref 81–101)
MONO#: 0.5 10*3/uL (ref 0.1–0.9)
MONO%: 9 % (ref 0.0–13.0)
NEUT#: 2.5 10*3/uL (ref 1.5–6.5)
NEUT%: 47.1 % (ref 39.6–80.0)
Platelets: 246 10*3/uL (ref 145–400)
RBC: 4.74 10*6/uL (ref 3.70–5.32)
RDW: 14.2 % (ref 11.1–15.7)
WBC: 5.3 10*3/uL (ref 3.9–10.0)

## 2013-04-29 LAB — IRON AND TIBC CHCC
%SAT: 45 % (ref 21–57)
Iron: 135 ug/dL (ref 41–142)
TIBC: 299 ug/dL (ref 236–444)
UIBC: 164 ug/dL (ref 120–384)

## 2013-04-29 LAB — FERRITIN CHCC: Ferritin: 98 ng/ml (ref 9–269)

## 2013-04-29 LAB — RETICULOCYTES (CHCC)
ABS RETIC: 24.3 10*3/uL (ref 19.0–186.0)
RBC.: 4.85 MIL/uL (ref 3.87–5.11)
Retic Ct Pct: 0.5 % (ref 0.4–2.3)

## 2013-04-29 LAB — VITAMIN B12: VITAMIN B 12: 306 pg/mL (ref 211–911)

## 2013-04-29 LAB — CHCC SATELLITE - SMEAR

## 2013-04-29 NOTE — Progress Notes (Signed)
Hematology and Oncology Follow Up Visit  Carla Clark [id number] Sep 14, 1985 28 y.o. 04/29/2013   Principle Diagnosis:   Iron deficiency anemia  Crohn's disease  Current Therapy:    IV iron as indicated     Interim History:  Ms.  Carla Clark is back for followup. She was seen last about 2 months ago. She's done very well with IV iron. I gave her iron back in January. This is Helder over so far.  We last saw her, her ferritin was 100.  She's had no bleeding. Her monthly cycles are okay. She's had no issues with the processes. 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  She's had no change in her diet. She had no cough or shortness of breath.    Medications: Current outpatient prescriptions:adalimumab (HUMIRA) 40 MG/0.8ML injection, Inject one- 46m/0.8ml pen into skin every 2 weeks as Maintenance Dose beginning at WEEK 6., Disp: , Rfl: ;  budesonide (ENTOCORT EC) 3 MG 24 hr capsule, Take 3 mg by mouth as needed., Disp: , Rfl: ;  Cholecalciferol (VITAMIN D3) 50000 UNITS CAPS, Take 1 capsule by mouth once a week., Disp: 8 capsule, Rfl: 0 Cyanocobalamin 500 MCG/0.1ML SOLN, Place 0.1 mLs into the nose once a week., Disp: , Rfl: ;  desogestrel-ethinyl estradiol (KARIVA) 0.15-0.02/0.01 MG (21/5) tablet, Take 1 tablet by mouth daily., Disp: 3 Package, Rfl: 3;  Probiotic Product (MISC INTESTINAL FLORA REGULAT) CHEW, Chew by mouth every other day. Digestive Health probiotic gummies by Schiff, Disp: , Rfl:   Allergies: No Known Allergies  Past Medical History, Surgical history, Social history, and Family History were reviewed and updated.  Review of Systems: As above  Physical Exam:  height is 5' 3"  (1.6 m) and weight is 136 lb (61.689 kg). Her oral temperature is 98.4 F (36.9 C). Her blood pressure is 112/70 and her pulse is 68. Her respiration is 14.   Well-developed well-nourished white female. Head and neck exam shows no ocular or oral lesions. She has no scleral icterus. There is no  mucositis. Neck is supple with no lymphadenopathy. Lungs are clear. Cardiac exam regular rate and rhythm with no murmurs rubs or bruits. Abdomen is soft. Has good bowel sounds. There is no fluid wave. There is no palpable liver or spleen to. Extremities shows no clubbing cyanosis or edema. Neurological exam shows no focal neurological deficits. Skin exam no rashes ecchymoses or petechia. Lab Results  Component Value Date   WBC 5.3 04/29/2013   HGB 13.8 04/29/2013   HCT 41.5 04/29/2013   MCV 88 04/29/2013   PLT 246 04/29/2013     Chemistry      Component Value Date/Time   NA 141 08/20/2012 1139   K 4.1 08/20/2012 1139   CL 106 08/20/2012 1139   CO2 27 08/20/2012 1139   BUN 8 08/20/2012 1139   CREATININE 0.62 08/20/2012 1139   CREATININE 0.7 11/29/2011 1430      Component Value Date/Time   CALCIUM 9.3 08/20/2012 1139   ALKPHOS 51 08/20/2012 1139   AST 11 08/20/2012 1139   ALT 11 08/20/2012 1139   BILITOT 0.2* 08/20/2012 1139     Her ferritin is 98. Iron saturation is 45%.    Impression and Plan: Ms. Carla Deteris a 28year old white female. She has an iron deficiency anemia. This is from Crohn's disease and mild shortness of. She also has some loss of from her monthly cycles.  Her iron studies we will good right now.  They were probably  get her back in another 3 months. We'll try get her back after her school is done.  She is on the way to [facility] for her husband's school and I told her to make sure she wears a sunscreen and takes a lot of water.   Volanda Napoleon, MD 4/6/20156:22 PM

## 2013-04-30 ENCOUNTER — Telehealth: Payer: Self-pay | Admitting: *Deleted

## 2013-04-30 NOTE — Telephone Encounter (Addendum)
Message copied by Lenn Sink on Tue Apr 30, 2013  1:22 PM ------      Message from: Volanda Napoleon      Created: Tue Apr 30, 2013  6:27 AM       Call - iron is much better!!  Have a good time at [employer] ------Informed pt that iron is better.

## 2013-05-01 ENCOUNTER — Telehealth: Payer: Self-pay | Admitting: Hematology & Oncology

## 2013-05-01 NOTE — Telephone Encounter (Signed)
Left message with 7-6 appointment

## 2013-07-28 ENCOUNTER — Other Ambulatory Visit: Payer: Self-pay | Admitting: Family Medicine

## 2013-07-29 ENCOUNTER — Encounter: Payer: Self-pay | Admitting: Family

## 2013-07-29 ENCOUNTER — Other Ambulatory Visit (HOSPITAL_BASED_OUTPATIENT_CLINIC_OR_DEPARTMENT_OTHER): Payer: BC Managed Care – PPO | Admitting: Lab

## 2013-07-29 ENCOUNTER — Ambulatory Visit (HOSPITAL_BASED_OUTPATIENT_CLINIC_OR_DEPARTMENT_OTHER): Payer: BC Managed Care – PPO | Admitting: Family

## 2013-07-29 VITALS — BP 115/65 | HR 70 | Temp 98.0°F | Resp 14 | Ht 63.0 in | Wt 140.0 lb

## 2013-07-29 DIAGNOSIS — N920 Excessive and frequent menstruation with regular cycle: Secondary | ICD-10-CM

## 2013-07-29 DIAGNOSIS — D509 Iron deficiency anemia, unspecified: Secondary | ICD-10-CM

## 2013-07-29 DIAGNOSIS — D508 Other iron deficiency anemias: Secondary | ICD-10-CM

## 2013-07-29 DIAGNOSIS — K5090 Crohn's disease, unspecified, without complications: Secondary | ICD-10-CM

## 2013-07-29 DIAGNOSIS — D649 Anemia, unspecified: Secondary | ICD-10-CM

## 2013-07-29 LAB — CHCC SATELLITE - SMEAR

## 2013-07-29 LAB — CBC WITH DIFFERENTIAL (CANCER CENTER ONLY)
BASO#: 0 10*3/uL (ref 0.0–0.2)
BASO%: 0.6 % (ref 0.0–2.0)
EOS%: 3 % (ref 0.0–7.0)
Eosinophils Absolute: 0.2 10*3/uL (ref 0.0–0.5)
HCT: 40.3 % (ref 34.8–46.6)
HEMOGLOBIN: 13.3 g/dL (ref 11.6–15.9)
LYMPH#: 2.1 10*3/uL (ref 0.9–3.3)
LYMPH%: 38.7 % (ref 14.0–48.0)
MCH: 29.8 pg (ref 26.0–34.0)
MCHC: 33 g/dL (ref 32.0–36.0)
MCV: 90 fL (ref 81–101)
MONO#: 0.4 10*3/uL (ref 0.1–0.9)
MONO%: 8.1 % (ref 0.0–13.0)
NEUT%: 49.6 % (ref 39.6–80.0)
NEUTROS ABS: 2.7 10*3/uL (ref 1.5–6.5)
Platelets: 220 10*3/uL (ref 145–400)
RBC: 4.46 10*6/uL (ref 3.70–5.32)
RDW: 12.6 % (ref 11.1–15.7)
WBC: 5.4 10*3/uL (ref 3.9–10.0)

## 2013-07-29 LAB — RETICULOCYTES (CHCC)
ABS RETIC: 22.7 10*3/uL (ref 19.0–186.0)
RBC.: 4.53 MIL/uL (ref 3.87–5.11)
RETIC CT PCT: 0.5 % (ref 0.4–2.3)

## 2013-07-29 NOTE — Progress Notes (Signed)
[facility]  [phone] [phone]  ID: Carla Clark OB: 30-Jan-1985 MR#: [id number] Patient Care Team: Mosie Lukes, MD as PCP - General (Family Medicine)  DIAGNOSIS: Iron deficiency anemia  Crohn's disease  INTERVAL HISTORY: Carla Clark is a pleasant 28 yo female here alone today for her 3 mont followup.  She's done very well and hasn't needed an iron infusion since January. She denies having had any bleeding. Her monthly cycles have been normal. She states that she has some mild fatigue at times but that it is much improved where she has been off for the summer and able to get more rest. She states that she has had no constipation, diarrhea or blood in her stool. Her medication regimen for Crohn's is working well.   Her appetite is good. She had no cough or shortness of breath. She denies headaches, dizziness, chest pain, palpitations, abdominal pain or swelling in her extremities. She states that she feels good.  CURRENT TREATMENT: IV iron as indicated  REVIEW OF SYSTEMS:All other 10 point review of systems is negative except for those issues mentioned above.   PAST MEDICAL HISTORY: Past Medical History  Diagnosis Date  . Chicken pox as a child  . Colitis 11/06/2011  . Preventative health care 11/06/2011  . Anemia 01/27/2012  . Contraceptive management 01/27/2012  . Breast mass in female 04/21/2012  . Crohn's disease   . Cervical cancer screening 01/27/2012  . Other specified iron deficiency anemias 01/21/2013   PAST SURGICAL HISTORY: Past Surgical History  Procedure Laterality Date  . Wisdom tooth extraction  28 yrs old   FAMILY HISTORY Family History  Problem Relation Age of Onset  . Nephrolithiasis Father   . Lung cancer Maternal Grandmother   . Gout Maternal Grandfather   . Ovarian cancer Paternal Grandmother   . Cancer Paternal Grandmother     ovarian  . Pancreatic cancer Paternal Grandfather   . Cancer Paternal  Aunt     ovarian   GYNECOLOGIC HISTORY:  No LMP recorded.   SOCIAL HISTORY:  History   Social History  . Marital Status: Single    Spouse Name: N/A    Number of Children: N/A  . Years of Education: N/A   Occupational History  . teacher    Social History Main Topics  . Smoking status: Never Smoker   . Smokeless tobacco: Never Used     Comment: never used tobacco  . Alcohol Use: Yes     Comment: occasionaly  . Drug Use: No  . Sexual Activity: Yes    Partners: Male   Other Topics Concern  . Not on file   Social History Narrative  . No narrative on file   ADVANCED DIRECTIVES: <no information>  HEALTH MAINTENANCE: History  Substance Use Topics  . Smoking status: Never Smoker   . Smokeless tobacco: Never Used     Comment: never used tobacco  . Alcohol Use: Yes     Comment: occasionaly   Colonoscopy: PAP: Bone density: Lipid panel:  No Known Allergies  Current Outpatient Prescriptions  Medication Sig Dispense Refill  . adalimumab (HUMIRA) 40 MG/0.8ML injection Inject one- 92m/0.8ml pen into skin every 2 weeks as Maintenance Dose beginning at WEEK 6.      . Cholecalciferol (VITAMIN D3) 50000 UNITS CAPS Take 1 capsule by mouth once a week.  8 capsule  0  . Cyanocobalamin 500 MCG/0.1ML SOLN Place 0.1 mLs into the nose once a week.      .Marland Kitchen  Probiotic Product (MISC INTESTINAL FLORA REGULAT) CHEW Chew by mouth every other day. Digestive Health probiotic gummies by Schiff      . VIORELE 0.15-0.02/0.01 MG (21/5) tablet TAKE 1 TABLET BY MOUTH EVERY DAY  84 tablet  0  . budesonide (ENTOCORT EC) 3 MG 24 hr capsule Take 3 mg by mouth as needed.       No current facility-administered medications for this visit.   OBJECTIVE: Filed Vitals:   07/29/13 1226  BP: 115/65  Pulse: 70  Temp: 98 F (36.7 C)  Resp: 14   Body mass index is 24.81 kg/(m^2). ECOG FS:0 - Asymptomatic Ocular: Sclerae unicteric, pupils equal, round and reactive to light Ear-nose-throat: Oropharynx  clear, dentition fair Lymphatic: No cervical or supraclavicular adenopathy Lungs no rales or rhonchi, good excursion bilaterally Heart regular rate and rhythm, no murmur appreciated Abd soft, nontender, positive bowel sounds MSK no focal spinal tenderness, no joint edema Neuro: non-focal, well-oriented, appropriate affect Breasts: Deferred  LAB RESULTS:  No results found for this basename: SPEP, UPEP,  kappa and lambda light chains   Lab Results  Component Value Date   WBC 5.4 07/29/2013   NEUTROABS 2.7 07/29/2013   HGB 13.3 07/29/2013   HCT 40.3 07/29/2013   MCV 90 07/29/2013   PLT 220 07/29/2013   No results found for this basename: LABCA2   No components found with this basename: [id number]   No results found for this basename: INR,  in the last 168 hours  STUDIES: No results found.  ASSESSMENT/PLAN: Carla Clark is a very pleasant 28 year old white female with iron deficiency anemia. This is from Crohn's disease and she also has some loss from her monthly cycles. She is asymptomatic except for some mild fatigue at times.   We discussed her CBC in detail and I gave her copies of her labs and medication list. All other labs still pending.    She does not need an iron infusion today.   We will see her back in 3 months for her next follow-up appointment and labs.   All questions were answered and she is in agreement with the plan. She was instructed to call here with any questions or concerns or go to the ED in the event of an emergency. We can certainly see her sooner if need be.  [name], NP 07/29/2013 1:16 PM

## 2013-07-30 LAB — IRON AND TIBC CHCC
%SAT: 44 % (ref 21–57)
IRON: 124 ug/dL (ref 41–142)
TIBC: 285 ug/dL (ref 236–444)
UIBC: 161 ug/dL (ref 120–384)

## 2013-07-30 LAB — FERRITIN CHCC: FERRITIN: 92 ng/mL (ref 9–269)

## 2013-07-31 ENCOUNTER — Encounter: Payer: Self-pay | Admitting: *Deleted

## 2013-08-22 ENCOUNTER — Encounter: Payer: BC Managed Care – PPO | Admitting: Family Medicine

## 2013-08-23 ENCOUNTER — Other Ambulatory Visit: Payer: Self-pay | Admitting: Internal Medicine

## 2013-09-03 ENCOUNTER — Encounter: Payer: Self-pay | Admitting: Internal Medicine

## 2013-09-03 NOTE — Progress Notes (Signed)
Patient has been approved for Humira from [facility] from 08/12/13-09/01/18. PA # is [id number].

## 2013-09-10 ENCOUNTER — Ambulatory Visit (INDEPENDENT_AMBULATORY_CARE_PROVIDER_SITE_OTHER): Payer: BC Managed Care – PPO | Admitting: Internal Medicine

## 2013-09-10 ENCOUNTER — Encounter: Payer: Self-pay | Admitting: Internal Medicine

## 2013-09-10 VITALS — BP 100/64 | HR 66 | Ht 63.0 in | Wt 141.4 lb

## 2013-09-10 DIAGNOSIS — D509 Iron deficiency anemia, unspecified: Secondary | ICD-10-CM

## 2013-09-10 DIAGNOSIS — E559 Vitamin D deficiency, unspecified: Secondary | ICD-10-CM

## 2013-09-10 DIAGNOSIS — K219 Gastro-esophageal reflux disease without esophagitis: Secondary | ICD-10-CM | POA: Insufficient documentation

## 2013-09-10 DIAGNOSIS — K5000 Crohn's disease of small intestine without complications: Secondary | ICD-10-CM

## 2013-09-10 DIAGNOSIS — Z79899 Other long term (current) drug therapy: Secondary | ICD-10-CM

## 2013-09-10 DIAGNOSIS — K50018 Crohn's disease of small intestine with other complication: Secondary | ICD-10-CM

## 2013-09-10 MED ORDER — PANTOPRAZOLE SODIUM 40 MG PO TBEC: 40.0000 mg | DELAYED_RELEASE_TABLET | Freq: Every day | ORAL | Status: DC

## 2013-09-10 NOTE — Progress Notes (Signed)
Subjective:    Patient ID: Carla Clark, female    DOB: 03/03/1985, 28 y.o.   MRN: [id number]  HPI Carla Clark is a 28 year old female with a past medical history of ileal Crohn's with iron deficiency anemia who is seen in followup. She was last seen in January 2015. She has been maintained on Humira 40 mg every 14 days. She has weaned off budesonide completely and has not taken this in many months. She is seeing Dr. Marin Olp with hematology for iron supplementation. Recently her iron studies and hemoglobin have improved without the need for subsequent iron infusion. Her last iron infusion was in January 2015. She denies lower abdominal pain. Bowel movements have been regular without blood or melena. She has noted heartburn with mild epigastric pain over the last month. She reports over the summer she worked at a [employer] and was [employer]". She feels that his change in diet may have led to her heartburn. It has not caused her lower right-sided abdominal pain which she had previously. She denies fevers or chills. No weight loss. And according to our scales she has gained approximately 12 pounds. She has used TUMS for heartburn relief but this is transient. She denies dysphagia or odynophagia.   Review of Systems As per history of present illness, otherwise negative  Current Medications, Allergies, Past Medical History, Past Surgical History, Family History and Social History were reviewed in [employer] record.     Objective:   Physical Exam BP 100/64  Pulse 66  Ht 5' 3"  (1.6 m)  Wt 141 lb 6.4 oz (64.139 kg)  BMI 25.05 kg/m2 Constitutional: Well-developed and well-nourished. No distress. HEENT: Normocephalic and atraumatic. Oropharynx is clear and moist. No oropharyngeal exudate. Conjunctivae are normal.  No scleral icterus. Neck: Neck supple. Trachea midline. Cardiovascular: Normal rate, regular rhythm and intact distal pulses. No  M/R/G Pulmonary/chest: Effort normal and breath sounds normal. No wheezing, rales or rhonchi. Abdominal: Soft, mild right lower quadrant tenderness without rebound or guarding, nondistended. Bowel sounds active throughout. There are no masses palpable. No hepatosplenomegaly. Extremities: no clubbing, cyanosis, or edema Lymphadenopathy: No cervical adenopathy noted. Neurological: Alert and oriented to person place and time. Skin: Skin is warm and dry. No rashes noted. Psychiatric: Normal mood and affect. Behavior is normal.  CBC    Component Value Date/Time   WBC 5.4 07/29/2013 1157   WBC 6.9 12/03/2012 0953   RBC 4.53 07/29/2013 1157   RBC 4.46 07/29/2013 1157   RBC 4.63 12/03/2012 0953   HGB 13.3 07/29/2013 1157   HGB 11.5* 12/03/2012 0953   HCT 40.3 07/29/2013 1157   HCT 34.8* 12/03/2012 0953   PLT 220 07/29/2013 1157   PLT 296.0 12/03/2012 0953   MCV 90 07/29/2013 1157   MCV 75.2* 12/03/2012 0953   MCH 29.8 07/29/2013 1157   MCH 24.0* 08/20/2012 1139   MCHC 33.0 07/29/2013 1157   MCHC 33.0 12/03/2012 0953   RDW 12.6 07/29/2013 1157   RDW 18.1* 12/03/2012 0953   LYMPHSABS 2.1 07/29/2013 1157   LYMPHSABS 1.2 08/07/2012 0942   MONOABS 0.9 08/07/2012 0942   EOSABS 0.2 07/29/2013 1157   EOSABS 0.3 08/07/2012 0942   BASOSABS 0.0 07/29/2013 1157   BASOSABS 0.1 08/07/2012 0942   Iron/TIBC/Ferritin/ %Sat    Component Value Date/Time   IRON 124 07/29/2013 1157   IRON 56 12/03/2012 0953   TIBC 285 07/29/2013 1157   FERRITIN 92 07/29/2013 1157   FERRITIN 7.9* 12/03/2012  [place] 07/29/2013 1157   IRONPCTSAT 13.3* 12/03/2012 0953       Assessment & Plan:   28 year old female with a past medical history of ileal Crohn's with iron deficiency anemia who is seen in followup.  1.  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 She is tolerating Humira well and we will continue Humira 40 mg every 14 days. Flu vaccine recommended this fall. She is present had  Pneumovax. I will see her back in 6 months for this issue. I have requested a CRP to be drawn with her labs ordered by hematology on 10/01/2013  2. IDA -- improved likely as a result of mucosal healing associated with biologic therapy for her Crohn's disease. She will continue to monitor CBC in iron stores to hematology for now  3.  GERD -- mild though daily and without alarm symptoms. Will prescribe pantoprazole 40 mg daily x1 month. After one month I'll have her stop this medication and see how she does. If this fails to improve her symptoms I asked that she notify me if she voices understanding.  4. vitamin D deficiency -- previously supplemented with high dose vitamin D, she will continue vitamin D 1000 international units daily. Follow vitamin D levels.

## 2013-09-10 NOTE — Patient Instructions (Signed)
Continue Humira at current dose.   We have sent the following medications to your pharmacy for you to pick up at your convenience: Protonix.  We have placed the order for you to have labs added to your labs drawn at Dr. Antonieta Pert office. I have also printed a requisition for you to hand to there lab.

## 2013-09-19 ENCOUNTER — Telehealth: Payer: Self-pay | Admitting: Hematology & Oncology

## 2013-09-19 NOTE — Telephone Encounter (Signed)
Left pt message moved 9-8 to 9-22

## 2013-10-01 ENCOUNTER — Other Ambulatory Visit: Payer: BC Managed Care – PPO | Admitting: Lab

## 2013-10-01 ENCOUNTER — Ambulatory Visit: Payer: BC Managed Care – PPO | Admitting: Hematology & Oncology

## 2013-10-11 ENCOUNTER — Ambulatory Visit (HOSPITAL_BASED_OUTPATIENT_CLINIC_OR_DEPARTMENT_OTHER): Payer: BC Managed Care – PPO | Admitting: Family

## 2013-10-11 ENCOUNTER — Ambulatory Visit (HOSPITAL_BASED_OUTPATIENT_CLINIC_OR_DEPARTMENT_OTHER): Payer: BC Managed Care – PPO | Admitting: Lab

## 2013-10-11 DIAGNOSIS — D649 Anemia, unspecified: Secondary | ICD-10-CM

## 2013-10-11 DIAGNOSIS — D509 Iron deficiency anemia, unspecified: Secondary | ICD-10-CM

## 2013-10-11 DIAGNOSIS — D500 Iron deficiency anemia secondary to blood loss (chronic): Secondary | ICD-10-CM

## 2013-10-11 DIAGNOSIS — N189 Chronic kidney disease, unspecified: Secondary | ICD-10-CM

## 2013-10-11 DIAGNOSIS — D508 Other iron deficiency anemias: Secondary | ICD-10-CM

## 2013-10-11 LAB — CBC WITH DIFFERENTIAL (CANCER CENTER ONLY)
BASO#: 0 10*3/uL (ref 0.0–0.2)
BASO%: 0.3 % (ref 0.0–2.0)
EOS ABS: 0.2 10*3/uL (ref 0.0–0.5)
EOS%: 2.3 % (ref 0.0–7.0)
HCT: 37.6 % (ref 34.8–46.6)
HGB: 12.7 g/dL (ref 11.6–15.9)
LYMPH#: 2.5 10*3/uL (ref 0.9–3.3)
LYMPH%: 31.7 % (ref 14.0–48.0)
MCH: 29.5 pg (ref 26.0–34.0)
MCHC: 33.8 g/dL (ref 32.0–36.0)
MCV: 87 fL (ref 81–101)
MONO#: 0.7 10*3/uL (ref 0.1–0.9)
MONO%: 8.5 % (ref 0.0–13.0)
NEUT%: 57.2 % (ref 39.6–80.0)
NEUTROS ABS: 4.6 10*3/uL (ref 1.5–6.5)
Platelets: 257 10*3/uL (ref 145–400)
RBC: 4.3 10*6/uL (ref 3.70–5.32)
RDW: 12.3 % (ref 11.1–15.7)
WBC: 8 10*3/uL (ref 3.9–10.0)

## 2013-10-11 LAB — CHCC SATELLITE - SMEAR

## 2013-10-11 LAB — RETICULOCYTES (CHCC)
ABS Retic: 26.2 10*3/uL (ref 19.0–186.0)
RBC.: 4.37 MIL/uL (ref 3.87–5.11)
RETIC CT PCT: 0.6 % (ref 0.4–2.3)

## 2013-10-11 NOTE — Progress Notes (Signed)
[facility]  Telephone:(336) [phone] Fax:(336) [phone]  ID: Carla Clark OB: August 22, 1985 MR#: [id number] QZR#:[id number] Patient Care Team: Mosie Lukes, MD as PCP - General (Family Medicine)  DIAGNOSIS: Iron deficiency anemia  Crohn's disease  INTERVAL HISTORY: Carla Clark is a pleasant 28 yo female here alone today for a followup. She's done very well and hasn't needed an iron infusion since January. She denies having had any bleeding or pain. Her monthly cycles have been normal. She states that she has some mild fatigue at times but that it is much improved where she has been off for the summer and able to get more rest. She states that she has had no constipation, diarrhea or blood in her stool. Her medication regimen for Crohn's is working well.   Her appetite is good and she is drinking plenty of fluids. She had no cough or shortness of breath. She denies headaches, dizziness, chest pain, palpitations, abdominal pain or swelling in her extremities. Overall, she is doing quite well.  CURRENT TREATMENT: IV iron as indicated  REVIEW OF SYSTEMS:All other 10 point review of systems is negative except for those issues mentioned above.   PAST MEDICAL HISTORY: Past Medical History  Diagnosis Date  . Chicken pox as a child  . Colitis 11/06/2011  . Preventative health care 11/06/2011  . Anemia 01/27/2012  . Contraceptive management 01/27/2012  . Breast mass in female 04/21/2012  . Crohn's disease   . Cervical cancer screening 01/27/2012  . Other specified iron deficiency anemias 01/21/2013   PAST SURGICAL HISTORY: Past Surgical History  Procedure Laterality Date  . Wisdom tooth extraction  28 yrs old   FAMILY HISTORY Family History  Problem Relation Age of Onset  . Nephrolithiasis Father   . Lung cancer Maternal Grandmother   . Gout Maternal Grandfather   . Ovarian cancer Paternal Grandmother   . Cancer Paternal Grandmother     ovarian  . Pancreatic cancer Paternal  Grandfather   . Cancer Paternal Aunt     ovarian   GYNECOLOGIC HISTORY:  No LMP recorded.   SOCIAL HISTORY:  History   Social History  . Marital Status: Single    Spouse Name: N/A    Number of Children: N/A  . Years of Education: N/A   Occupational History  . teacher    Social History Main Topics  . Smoking status: Never Smoker   . Smokeless tobacco: Never Used     Comment: never used tobacco  . Alcohol Use: Yes     Comment: occasionaly  . Drug Use: No  . Sexual Activity: Yes    Partners: Male   Other Topics Concern  . Not on file   Social History Narrative  . No narrative on file   ADVANCED DIRECTIVES: <no information>  HEALTH MAINTENANCE: History  Substance Use Topics  . Smoking status: Never Smoker   . Smokeless tobacco: Never Used     Comment: never used tobacco  . Alcohol Use: Yes     Comment: occasionaly   Colonoscopy: PAP: Bone density: Lipid panel:  No Known Allergies  Current Outpatient Prescriptions  Medication Sig Dispense Refill  . Adalimumab (HUMIRA PEN) 40 MG/0.8ML PNKT Inject 1 pen into the skin every 14 (fourteen) days.  2 each  1  . pantoprazole (PROTONIX) 40 MG tablet Take 1 tablet (40 mg total) by mouth daily.  30 tablet  0  . Probiotic Product (MISC INTESTINAL FLORA REGULAT) CHEW Chew by mouth every other day.  Digestive Health probiotic gummies by Schiff      . VIORELE 0.15-0.02/0.01 MG (21/5) tablet TAKE 1 TABLET BY MOUTH EVERY DAY  84 tablet  0   No current facility-administered medications for this visit.   OBJECTIVE: There were no vitals filed for this visit. There is no weight on file to calculate BMI. ECOG FS:0 - Asymptomatic Ocular: Sclerae unicteric, pupils equal, round and reactive to light Ear-nose-throat: Oropharynx clear, dentition fair Lymphatic: No cervical or supraclavicular adenopathy Lungs no rales or rhonchi, good excursion bilaterally Heart regular rate and rhythm, no murmur appreciated Abd soft, nontender,  positive bowel sounds MSK no focal spinal tenderness, no joint edema Neuro: non-focal, well-oriented, appropriate affect Breasts: Deferred  LAB RESULTS:  No results found for this basename: SPEP,  UPEP,   kappa and lambda light chains   Lab Results  Component Value Date   WBC 8.0 10/11/2013   NEUTROABS 4.6 10/11/2013   HGB 12.7 10/11/2013   HCT 37.6 10/11/2013   MCV 87 10/11/2013   PLT 257 10/11/2013   No results found for this basename: LABCA2   No components found with this basename: [id number]   No results found for this basename: INR,  in the last 168 hours  STUDIES: No results found.  ASSESSMENT/PLAN: Carla Clark is a very pleasant 28 year old white female with iron deficiency anemia. This is from Crohn's disease and she also has some loss from her monthly cycles. She is asymptomatic except for some mild fatigue at times.  Her CBC looked good. We will wait and see what her iron studies show.  She does not need an iron infusion at this time.  We will see her back in 3 months for follow-up and labs.  All questions were answered and she is in agreement with the plan. She was instructed to call here with any questions or concerns or go to the ED in the event of an emergency. We can certainly see her sooner if need be.  [name], NP 10/11/2013 5:03 PM

## 2013-10-14 LAB — IRON AND TIBC CHCC
%SAT: 18 % — ABNORMAL LOW (ref 21–57)
IRON: 55 ug/dL (ref 41–142)
TIBC: 308 ug/dL (ref 236–444)
UIBC: 252 ug/dL (ref 120–384)

## 2013-10-14 LAB — FERRITIN CHCC: FERRITIN: 73 ng/mL (ref 9–269)

## 2013-10-15 ENCOUNTER — Other Ambulatory Visit: Payer: BC Managed Care – PPO | Admitting: Lab

## 2013-10-15 ENCOUNTER — Ambulatory Visit: Payer: BC Managed Care – PPO | Admitting: Family

## 2013-10-18 ENCOUNTER — Telehealth: Payer: Self-pay | Admitting: *Deleted

## 2013-10-18 NOTE — Telephone Encounter (Signed)
Dr Hilarie Fredrickson has reviewed CRP lab done by [facility] on 10/11/13. CRP was <0.5. Per Dr Hilarie Fredrickson, "looks good-notify patient. Follow up in 6 months from last office visit." I have left a message for patient to call back.

## 2013-10-21 NOTE — Telephone Encounter (Signed)
I have advised patient of Dr Vena Rua recommendation and of normal CRP result. She verbalizes understanding.

## 2013-10-21 NOTE — Telephone Encounter (Signed)
Left message for patient to call back  

## 2013-10-22 ENCOUNTER — Encounter: Payer: Self-pay | Admitting: Internal Medicine

## 2013-10-24 ENCOUNTER — Other Ambulatory Visit: Payer: Self-pay | Admitting: Internal Medicine

## 2013-11-05 ENCOUNTER — Ambulatory Visit (INDEPENDENT_AMBULATORY_CARE_PROVIDER_SITE_OTHER): Payer: BC Managed Care – PPO | Admitting: Physician Assistant

## 2013-11-05 ENCOUNTER — Encounter: Payer: Self-pay | Admitting: Physician Assistant

## 2013-11-05 VITALS — BP 124/76 | HR 79 | Temp 98.2°F | Resp 16 | Ht 63.0 in | Wt 141.2 lb

## 2013-11-05 DIAGNOSIS — L200 Besnier's prurigo: Secondary | ICD-10-CM

## 2013-11-05 DIAGNOSIS — Z23 Encounter for immunization: Secondary | ICD-10-CM

## 2013-11-05 DIAGNOSIS — L239 Allergic contact dermatitis, unspecified cause: Secondary | ICD-10-CM

## 2013-11-05 DIAGNOSIS — L309 Dermatitis, unspecified: Secondary | ICD-10-CM | POA: Insufficient documentation

## 2013-11-05 MED ORDER — CLOBETASOL PROPIONATE 0.05 % EX CREA
1.0000 | TOPICAL_CREAM | Freq: Two times a day (BID) | CUTANEOUS | Status: DC
Start: 2013-11-05 — End: 2014-01-13

## 2013-11-05 NOTE — Assessment & Plan Note (Signed)
Will start with Topical Clobetasol giving patient currently on immunosuppressants.  Sarna lotion.  Claritin or Benadryl. Cool compresses.  Monitor for symptom triggers.  Return precautions discussed with patient.

## 2013-11-05 NOTE — Progress Notes (Signed)
Patient presents to clinic today c/o pruritic rash of abdomen and neck x 2 weeks.  Rash is improving, per patient, bit still significantly pruritic.  Denies change to lotions, detergents or hygiene products.  Denies new pet.  Denies recent travel or sick contact.  Denies fever, chills, malaise.  Is on Humira at present for Crohns which is in remission.  Past Medical History  Diagnosis Date  . Chicken pox as a child  . Colitis 11/06/2011  . Preventative health care 11/06/2011  . Anemia 01/27/2012  . Contraceptive management 01/27/2012  . Breast mass in female 04/21/2012  . Crohn's disease   . Cervical cancer screening 01/27/2012  . Other specified iron deficiency anemias 01/21/2013    Current Outpatient Prescriptions on File Prior to Visit  Medication Sig Dispense Refill  . Adalimumab (HUMIRA PEN) 40 MG/0.8ML PNKT Inject 1 pen into the skin every 14 (fourteen) days.  2 each  2  . Probiotic Product (MISC INTESTINAL FLORA REGULAT) CHEW Chew by mouth every other day. Digestive Health probiotic gummies by Schiff      . VIORELE 0.15-0.02/0.01 MG (21/5) tablet TAKE 1 TABLET BY MOUTH EVERY DAY  84 tablet  0   No current facility-administered medications on file prior to visit.    No Known Allergies  Family History  Problem Relation Age of Onset  . Nephrolithiasis Father   . Lung cancer Maternal Grandmother   . Gout Maternal Grandfather   . Ovarian cancer Paternal Grandmother   . Cancer Paternal Grandmother     ovarian  . Pancreatic cancer Paternal Grandfather   . Cancer Paternal Aunt     ovarian    History   Social History  . Marital Status: Single    Spouse Name: N/A    Number of Children: N/A  . Years of Education: N/A   Occupational History  . teacher    Social History Main Topics  . Smoking status: Never Smoker   . Smokeless tobacco: Never Used     Comment: never used tobacco  . Alcohol Use: Yes     Comment: occasionaly  . Drug Use: No  . Sexual Activity: Yes   Partners: Male   Other Topics Concern  . Not on file   Social History Narrative  . No narrative on file    Review of Systems - See HPI.  All other ROS are negative.  BP 124/76  Pulse 79  Temp(Src) 98.2 F (36.8 C) (Oral)  Resp 16  Ht 5' 3"  (1.6 m)  Wt 141 lb 4 oz (64.071 kg)  BMI 25.03 kg/m2  SpO2 100%  LMP 10/12/2013  Physical Exam  Vitals reviewed. Constitutional: She is oriented to person, place, and time and well-developed, well-nourished, and in no distress.  HENT:  Head: Normocephalic and atraumatic.  Eyes: Conjunctivae are normal.  Cardiovascular: Normal rate, regular rhythm, normal heart sounds and intact distal pulses.   Pulmonary/Chest: Effort normal and breath sounds normal. No respiratory distress. She has no wheezes. She has no rales. She exhibits no tenderness.  Neurological: She is alert and oriented to person, place, and time.  Skin: Skin is warm and dry.  Presence of an erythematous macular rash of left lower abdomen and left anterior neck, with areas of confluence noted.  Some mild excoriation noted without evidence of cellulitis.  Psychiatric: Affect normal.    Recent Results (from the past 2160 hour(s))  CBC WITH DIFFERENTIAL ([facility])     Status: None   Collection  Time    10/11/13  2:33 PM      Result Value Ref Range   WBC 8.0  3.9 - 10.0 10e3/uL   RBC 4.30  3.70 - 5.32 10e6/uL   HGB 12.7  11.6 - 15.9 g/dL   HCT 37.6  34.8 - 46.6 %   MCV 87  81 - 101 fL   MCH 29.5  26.0 - 34.0 pg   MCHC 33.8  32.0 - 36.0 g/dL   RDW 12.3  11.1 - 15.7 %   Platelets 257  145 - 400 10e3/uL   NEUT# 4.6  1.5 - 6.5 10e3/uL   LYMPH# 2.5  0.9 - 3.3 10e3/uL   MONO# 0.7  0.1 - 0.9 10e3/uL   Eosinophils Absolute 0.2  0.0 - 0.5 10e3/uL   BASO# 0.0  0.0 - 0.2 10e3/uL   NEUT% 57.2  39.6 - 80.0 %   LYMPH% 31.7  14.0 - 48.0 %   MONO% 8.5  0.0 - 13.0 %   EOS% 2.3  0.0 - 7.0 %   BASO% 0.3  0.0 - 2.0 %  [facility] - SMEAR     Status: None   Collection Time     10/11/13  2:33 PM      Result Value Ref Range   Smear Result Smear Available    FERRITIN [facility]     Status: None   Collection Time    10/11/13  2:33 PM      Result Value Ref Range   Ferritin 73  9 - 269 ng/ml  IRON AND TIBC [facility]     Status: Abnormal   Collection Time    10/11/13  2:33 PM      Result Value Ref Range   Iron 55  41 - 142 ug/dL   TIBC 308  236 - 444 ug/dL   UIBC 252  120 - 384 ug/dL   %SAT 18 (*) 21 - 57 %  RETICULOCYTE COUNT ([facility])     Status: None   Collection Time    10/11/13  2:34 PM      Result Value Ref Range   Retic Ct Pct 0.6  0.4 - 2.3 %   RBC. 4.37  3.87 - 5.11 MIL/uL   ABS Retic 26.2  19.0 - 186.0 K/uL    Assessment/Plan: Allergic dermatitis Will start with Topical Clobetasol giving patient currently on immunosuppressants.  Sarna lotion.  Claritin or Benadryl. Cool compresses.  Monitor for symptom triggers.  Return precautions discussed with patient.

## 2013-11-05 NOTE — Progress Notes (Signed)
Pre visit review using our clinic review tool, if applicable. No additional management support is needed unless otherwise documented below in the visit note/SLS  

## 2013-11-05 NOTE — Patient Instructions (Signed)
Apply Clobetasol cream to areas of concern twice daily for 2 weeks.  Avoid use near eyes.  Take a daily Claritin for itch.  Can use Benadryl at bedtime if itch is severe.  Topical Sarna lotion will also be beneficial.  This is an over-the-counter lotion.  If symptoms are not improving over the next few days, we will need to proceed with oral steroid medications.

## 2013-11-06 DIAGNOSIS — Z23 Encounter for immunization: Secondary | ICD-10-CM

## 2013-11-11 ENCOUNTER — Telehealth: Payer: Self-pay | Admitting: Internal Medicine

## 2013-11-11 NOTE — Telephone Encounter (Signed)
I have called [facility] to find out why patient is unable to get Humira script which their pharmacy confirmed receipt on back on 10/24/13. I am advised that there "was a problem with the prescription image" and that I would need to give a verbal authorization for script. I indicated my disappointment that we sent the script on 10/24/13 and have yet to be informed that there was a problem. Instead, they have made the patient go without medication and have made me wait for 20 minutes on the phone to give a verbal order. I have given the verbal order and have asked that they mark shipment as urgent since they neglected to let us know there was a problem and the patient is low on medication. [place] with [facility] indiates that she will do this. I have left a message for patient to call back.

## 2013-11-11 NOTE — Telephone Encounter (Signed)
I have spoken to patient to advise of the conversation below. She verbalizes understanding and will call back if she has not heard from [facility] by noon tomorrow since she is due for injection on Wednesday.

## 2013-11-12 ENCOUNTER — Telehealth: Payer: Self-pay | Admitting: Internal Medicine

## 2013-11-12 NOTE — Telephone Encounter (Signed)
I called and spoken to pharmacist at [facility] again. I explained the situation to her. She states that they have a Madolyn Ackroyd in their system with the same information as our patient, Carla Clark. She will make sure the Humira is expedited to go out today and will contact the patient with this information. I left a message advising patient of this. I have also asked that she update her name with either [place] or [facility] so that we both have the same information on file.

## 2013-11-13 ENCOUNTER — Telehealth: Payer: Self-pay | Admitting: Family Medicine

## 2013-11-13 ENCOUNTER — Telehealth: Payer: Self-pay | Admitting: Internal Medicine

## 2013-11-13 DIAGNOSIS — R21 Rash and other nonspecific skin eruption: Secondary | ICD-10-CM

## 2013-11-13 NOTE — Telephone Encounter (Signed)
She needs to see Dermatology for a biopsy and evaluation.  Please let me know if she is willing to let me facilitate this

## 2013-11-13 NOTE — Telephone Encounter (Signed)
Caller name: Malayiah Relation to pt: self Call back number: [phone] Pharmacy: [facility] on [address]  Reason for call:   Patient states that she is still having problems with the rash and would like to know what else she should do? She saw Einar Pheasant last week for this.

## 2013-11-13 NOTE — Telephone Encounter (Signed)
Please Advise

## 2013-11-13 NOTE — Telephone Encounter (Signed)
LMOM with contact name and number for return call RE: Referral to Dermatology as discussed at [facility] and further provider instructions/SLS

## 2013-11-14 NOTE — Telephone Encounter (Signed)
Patient does want to proceed with referral

## 2013-11-14 NOTE — Telephone Encounter (Signed)
Referral placed.

## 2013-11-14 NOTE — Telephone Encounter (Signed)
I have spoken to patient. Her medication is to come in today. She also states that her name is now Carla Clark and her insurance information has been updated to reflect this. Her name has been changed in our system per her request.

## 2013-11-14 NOTE — Telephone Encounter (Signed)
I have left a message for patient to call back.

## 2014-01-02 ENCOUNTER — Encounter: Payer: Self-pay | Admitting: Internal Medicine

## 2014-01-03 ENCOUNTER — Telehealth: Payer: Self-pay | Admitting: Internal Medicine

## 2014-01-03 NOTE — Telephone Encounter (Signed)
Left message for pt to call back  °

## 2014-01-06 MED ORDER — AZITHROMYCIN 250 MG PO TABS: ORAL_TABLET | ORAL | Status: DC

## 2014-01-06 NOTE — Telephone Encounter (Signed)
Pt states she has been taking humira for about a year and she read on her papers regarding humira that she was supposed to notify her MD if she had a cough. Pt states she has had a cough for about a month now and that she is coughing up some mucous. States that some days the mucous is white-clear and some days the mucous has a light green tint to it. Dr. Hilarie Fredrickson please advise.

## 2014-01-06 NOTE — Telephone Encounter (Signed)
If no fevers or chills, will treat for subacute bronchitis given hx of Crohn's on Humira z-pak x 5 days Call if no improvement or worsening

## 2014-01-06 NOTE — Telephone Encounter (Signed)
Left message for pt to call back  °

## 2014-01-06 NOTE — Telephone Encounter (Signed)
Pt aware and script sent to pharmacy.

## 2014-01-10 ENCOUNTER — Telehealth: Payer: Self-pay | Admitting: Family Medicine

## 2014-01-10 MED ORDER — DESOGESTREL-ETHINYL ESTRADIOL 0.15-0.02/0.01 MG (21/5) PO TABS: 1.0000 | ORAL_TABLET | Freq: Every day | ORAL | Status: DC

## 2014-01-10 NOTE — Telephone Encounter (Signed)
Refill sent to pharmacy. Patient notified.

## 2014-01-10 NOTE — Telephone Encounter (Signed)
Caller name: Earlene Relation to pt: self Call back number: [phone] Pharmacy: [facility] on [address]  Reason for call:   Patient would like to know if we could send in 1 more refill of bcp. She has appointment scheduled for 01/20/14

## 2014-01-13 ENCOUNTER — Ambulatory Visit (HOSPITAL_BASED_OUTPATIENT_CLINIC_OR_DEPARTMENT_OTHER): Payer: BC Managed Care – PPO | Admitting: Lab

## 2014-01-13 ENCOUNTER — Ambulatory Visit (HOSPITAL_BASED_OUTPATIENT_CLINIC_OR_DEPARTMENT_OTHER): Payer: BC Managed Care – PPO | Admitting: Hematology & Oncology

## 2014-01-13 ENCOUNTER — Encounter: Payer: Self-pay | Admitting: Hematology & Oncology

## 2014-01-13 VITALS — BP 119/69 | HR 78 | Temp 98.1°F | Resp 14 | Ht 63.0 in | Wt 143.0 lb

## 2014-01-13 DIAGNOSIS — D508 Other iron deficiency anemias: Secondary | ICD-10-CM

## 2014-01-13 DIAGNOSIS — D649 Anemia, unspecified: Secondary | ICD-10-CM

## 2014-01-13 DIAGNOSIS — K5090 Crohn's disease, unspecified, without complications: Secondary | ICD-10-CM

## 2014-01-13 LAB — CBC WITH DIFFERENTIAL (CANCER CENTER ONLY)
BASO#: 0 10*3/uL (ref 0.0–0.2)
BASO%: 0.4 % (ref 0.0–2.0)
EOS ABS: 0.3 10*3/uL (ref 0.0–0.5)
EOS%: 4 % (ref 0.0–7.0)
HEMATOCRIT: 41.1 % (ref 34.8–46.6)
HGB: 13.3 g/dL (ref 11.6–15.9)
LYMPH#: 2.7 10*3/uL (ref 0.9–3.3)
LYMPH%: 40.9 % (ref 14.0–48.0)
MCH: 29.1 pg (ref 26.0–34.0)
MCHC: 32.4 g/dL (ref 32.0–36.0)
MCV: 90 fL (ref 81–101)
MONO#: 0.7 10*3/uL (ref 0.1–0.9)
MONO%: 9.9 % (ref 0.0–13.0)
NEUT#: 3 10*3/uL (ref 1.5–6.5)
NEUT%: 44.8 % (ref 39.6–80.0)
Platelets: 252 10*3/uL (ref 145–400)
RBC: 4.57 10*6/uL (ref 3.70–5.32)
RDW: 12.4 % (ref 11.1–15.7)
WBC: 6.7 10*3/uL (ref 3.9–10.0)

## 2014-01-13 LAB — RETICULOCYTES (CHCC)
ABS Retic: 36.4 10*3/uL (ref 19.0–186.0)
RBC.: 4.55 MIL/uL (ref 3.87–5.11)
Retic Ct Pct: 0.8 % (ref 0.4–2.3)

## 2014-01-13 NOTE — Progress Notes (Signed)
Hematology and Oncology Follow Up Visit  Carla Clark [id number] January 20, 1986 28 y.o. 01/13/2014   Principle Diagnosis:   Iron deficiency anemia  Crohn's disease  Current Therapy:    IV iron as indicated     Interim History:  Carla Clark is back for followup. She was seen last about 3 months ago. She's done very well with IV iron. I gave her iron back in January.  When we last saw her, her ferritin was 73  She's had no bleeding. Her monthly cycles are okay. She's had no issues with Crohn's disease exacerbations. 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  She's had no change in her diet. She had no cough or shortness of breath.  She had a good Thanksgiving. She is off from school right now. She has she may be looking for a another job.  Her performance status is ECOG 0.  Medications: Current outpatient prescriptions: Adalimumab (HUMIRA PEN) 40 MG/0.8ML PNKT, Inject 1 pen into the skin every 14 (fourteen) days., Disp: 2 each, Rfl: 2;  azithromycin (ZITHROMAX Z-PAK) 250 MG tablet, Take as directed (Patient taking differently: Take 250 mg by mouth daily. Take as directed), Disp: 6 each, Rfl: 0;  Cetirizine HCl (ZYRTEC ALLERGY PO), Take by mouth 2 (two) times daily., Disp: , Rfl:  desogestrel-ethinyl estradiol (VIORELE) 0.15-0.02/0.01 MG (21/5) tablet, Take 1 tablet by mouth daily., Disp: 84 tablet, Rfl: 0;  diphenhydrAMINE (BENADRYL) 25 mg capsule, Take 25 mg by mouth at bedtime as needed. , Disp: , Rfl: ;  Probiotic Product (MISC INTESTINAL FLORA REGULAT) CHEW, Chew by mouth every other day. [employer] probiotic gummies by [employer], Disp: , Rfl:   Allergies: No Known Allergies  Past Medical History, Surgical history, Social history, and Family History were reviewed and updated.  Review of Systems: As above  Physical Exam:  height is 5' 3"  (1.6 m) and weight is 143 lb (64.864 kg). Her oral temperature is 98.1 F (36.7 C). Her blood pressure is 119/69 and her  pulse is 78. Her respiration is 14.   Well-developed well-nourished white female. Head and neck exam shows no ocular or oral lesions. She has no scleral icterus. There is no mucositis. Neck is supple with no lymphadenopathy. Lungs are clear. Cardiac exam regular rate and rhythm with no murmurs rubs or bruits. Abdomen is soft. Has good bowel sounds. There is no fluid wave. There is no palpable liver or spleen to. Extremities shows no clubbing cyanosis or edema. Neurological exam shows no focal neurological deficits. Skin exam no rashes ecchymoses or petechia. Lab Results  Component Value Date   WBC 6.7 01/13/2014   HGB 13.3 01/13/2014   HCT 41.1 01/13/2014   MCV 90 01/13/2014   PLT 252 01/13/2014     Chemistry      Component Value Date/Time   NA 141 08/20/2012 1139   K 4.1 08/20/2012 1139   CL 106 08/20/2012 1139   CO2 27 08/20/2012 1139   BUN 8 08/20/2012 1139   CREATININE 0.62 08/20/2012 1139   CREATININE 0.7 11/29/2011 1430      Component Value Date/Time   CALCIUM 9.3 08/20/2012 1139   ALKPHOS 51 08/20/2012 1139   AST 11 08/20/2012 1139   ALT 11 08/20/2012 1139   BILITOT 0.2* 08/20/2012 1139         Impression and Plan: Carla Clark is a 28 year old white female. She has an iron deficiency anemia. This is from Crohn's disease . She also has  some loss of from her monthly cycles.  They were probably get her back in another 6 months. We'll try get her back after her school is done.  She is doing quite well. I think 6 months would be appropriate. She can always let us know if she needs any blood work sooner.   Carla Napoleon, MD 12/21/20152:12 PM

## 2014-01-14 ENCOUNTER — Other Ambulatory Visit: Payer: Self-pay | Admitting: Internal Medicine

## 2014-01-14 LAB — FERRITIN CHCC: FERRITIN: 60 ng/mL (ref 9–269)

## 2014-01-14 LAB — IRON AND TIBC CHCC
%SAT: 27 % (ref 21–57)
IRON: 83 ug/dL (ref 41–142)
TIBC: 309 ug/dL (ref 236–444)
UIBC: 226 ug/dL (ref 120–384)

## 2014-01-20 ENCOUNTER — Encounter: Payer: Self-pay | Admitting: Family Medicine

## 2014-01-20 ENCOUNTER — Ambulatory Visit (INDEPENDENT_AMBULATORY_CARE_PROVIDER_SITE_OTHER): Payer: BC Managed Care – PPO | Admitting: Family Medicine

## 2014-01-20 ENCOUNTER — Other Ambulatory Visit (HOSPITAL_COMMUNITY)
Admission: RE | Admit: 2014-01-20 | Discharge: 2014-01-20 | Disposition: A | Discharge status: Home / Self Care | Payer: BC Managed Care – PPO | Source: Ambulatory Visit | Attending: Family Medicine | Admitting: Family Medicine

## 2014-01-20 VITALS — BP 125/83 | HR 86 | Temp 98.7°F | Ht 63.5 in | Wt 144.6 lb

## 2014-01-20 DIAGNOSIS — Z0000 Encounter for general adult medical examination without abnormal findings: Secondary | ICD-10-CM

## 2014-01-20 DIAGNOSIS — L200 Besnier's prurigo: Secondary | ICD-10-CM

## 2014-01-20 DIAGNOSIS — K219 Gastro-esophageal reflux disease without esophagitis: Secondary | ICD-10-CM

## 2014-01-20 DIAGNOSIS — L239 Allergic contact dermatitis, unspecified cause: Secondary | ICD-10-CM

## 2014-01-20 DIAGNOSIS — Z23 Encounter for immunization: Secondary | ICD-10-CM

## 2014-01-20 DIAGNOSIS — D508 Other iron deficiency anemias: Secondary | ICD-10-CM

## 2014-01-20 DIAGNOSIS — N760 Acute vaginitis: Secondary | ICD-10-CM | POA: Insufficient documentation

## 2014-01-20 DIAGNOSIS — N898 Other specified noninflammatory disorders of vagina: Secondary | ICD-10-CM

## 2014-01-20 DIAGNOSIS — K50919 Crohn's disease, unspecified, with unspecified complications: Secondary | ICD-10-CM

## 2014-01-20 NOTE — Assessment & Plan Note (Signed)
Avoids nuts, tomatoes and kale and this helps. Doing well on Humira will continue the same

## 2014-01-20 NOTE — Patient Instructions (Addendum)
Probiotic such as Digestive Advantage or [employer] [facility]   Preventive Care for Adults A healthy lifestyle and preventive care can promote health and wellness. Preventive health guidelines for women include the following key practices.  A routine yearly physical is a good way to check with your health care provider about your health and preventive screening. It is a chance to share any concerns and updates on your health and to receive a thorough exam.  Visit your dentist for a routine exam and preventive care every 6 months. Brush your teeth twice a day and floss once a day. Good oral hygiene prevents tooth decay and gum disease.  The frequency of eye exams is based on your age, health, family medical history, use of contact lenses, and other factors. Follow your health care provider's recommendations for frequency of eye exams.  Eat a healthy diet. Foods like vegetables, fruits, whole grains, low-fat dairy products, and lean protein foods contain the nutrients you need without too many calories. Decrease your intake of foods high in solid fats, added sugars, and salt. Eat the right amount of calories for you.Get information about a proper diet from your health care provider, if necessary.  Regular physical exercise is one of the most important things you can do for your health. Most adults should get at least 150 minutes of moderate-intensity exercise (any activity that increases your heart rate and causes you to sweat) each week. In addition, most adults need muscle-strengthening exercises on 2 or more days a week.  Maintain a healthy weight. The body mass index (BMI) is a screening tool to identify possible weight problems. It provides an estimate of body fat based on height and weight. Your health care provider can find your BMI and can help you achieve or maintain a healthy weight.For adults 20 years and older:  A BMI below 18.5 is considered underweight.  A BMI of 18.5 to 24.9 is  normal.  A BMI of 25 to 29.9 is considered overweight.  A BMI of 30 and above is considered obese.  Maintain normal blood lipids and cholesterol levels by exercising and minimizing your intake of saturated fat. Eat a balanced diet with plenty of fruit and vegetables. Blood tests for lipids and cholesterol should begin at age 57 and be repeated every 5 years. If your lipid or cholesterol levels are high, you are over 50, or you are at high risk for heart disease, you may need your cholesterol levels checked more frequently.Ongoing high lipid and cholesterol levels should be treated with medicines if diet and exercise are not working.  If you smoke, find out from your health care provider how to quit. If you do not use tobacco, do not start.  Lung cancer screening is recommended for adults aged 57-80 years who are at high risk for developing lung cancer because of a history of smoking. A yearly low-dose CT scan of the lungs is recommended for people who have at least a 30-pack-year history of smoking and are a current smoker or have quit within the past 15 years. A pack year of smoking is smoking an average of 1 pack of cigarettes a day for 1 year (for example: 1 pack a day for 30 years or 2 packs a day for 15 years). Yearly screening should continue until the smoker has stopped smoking for at least 15 years. Yearly screening should be stopped for people who develop a health problem that would prevent them from having lung cancer treatment.  If  you are pregnant, do not drink alcohol. If you are breastfeeding, be very cautious about drinking alcohol. If you are not pregnant and choose to drink alcohol, do not have more than 1 drink per day. One drink is considered to be 12 ounces (355 mL) of beer, 5 ounces (148 mL) of wine, or 1.5 ounces (44 mL) of liquor.  Avoid use of street drugs. Do not share needles with anyone. Ask for help if you need support or instructions about stopping the use of  drugs.  High blood pressure causes heart disease and increases the risk of stroke. Your blood pressure should be checked at least every 1 to 2 years. Ongoing high blood pressure should be treated with medicines if weight loss and exercise do not work.  If you are 22-13 years old, ask your health care provider if you should take aspirin to prevent strokes.  Diabetes screening involves taking a blood sample to check your fasting blood sugar level. This should be done once every 3 years, after age 45, if you are within normal weight and without risk factors for diabetes. Testing should be considered at a younger age or be carried out more frequently if you are overweight and have at least 1 risk factor for diabetes.  Breast cancer screening is essential preventive care for women. You should practice "breast self-awareness." This means understanding the normal appearance and feel of your breasts and may include breast self-examination. Any changes detected, no matter how small, should be reported to a health care provider. Women in their 64s and 30s should have a clinical breast exam (CBE) by a health care provider as part of a regular health exam every 1 to 3 years. After age 32, women should have a CBE every year. Starting at age 58, women should consider having a mammogram (breast X-ray test) every year. Women who have a family history of breast cancer should talk to their health care provider about genetic screening. Women at a high risk of breast cancer should talk to their health care providers about having an MRI and a mammogram every year.  Breast cancer gene (BRCA)-related cancer risk assessment is recommended for women who have family members with BRCA-related cancers. BRCA-related cancers include breast, ovarian, tubal, and peritoneal cancers. Having family members with these cancers may be associated with an increased risk for harmful changes (mutations) in the breast cancer genes BRCA1 and BRCA2.  Results of the assessment will determine the need for genetic counseling and BRCA1 and BRCA2 testing.  Routine pelvic exams to screen for cancer are no longer recommended for nonpregnant women who are considered low risk for cancer of the pelvic organs (ovaries, uterus, and vagina) and who do not have symptoms. Ask your health care provider if a screening pelvic exam is right for you.  If you have had past treatment for cervical cancer or a condition that could lead to cancer, you need Pap tests and screening for cancer for at least 20 years after your treatment. If Pap tests have been discontinued, your risk factors (such as having a new sexual partner) need to be reassessed to determine if screening should be resumed. Some women have medical problems that increase the chance of getting cervical cancer. In these cases, your health care provider may recommend more frequent screening and Pap tests.  The HPV test is an additional test that may be used for cervical cancer screening. The HPV test looks for the virus that can cause the cell changes on  the cervix. The cells collected during the Pap test can be tested for HPV. The HPV test could be used to screen women aged 60 years and older, and should be used in women of any age who have unclear Pap test results. After the age of 51, women should have HPV testing at the same frequency as a Pap test.  Colorectal cancer can be detected and often prevented. Most routine colorectal cancer screening begins at the age of 4 years and continues through age 63 years. However, your health care provider may recommend screening at an earlier age if you have risk factors for colon cancer. On a yearly basis, your health care provider may provide home test kits to check for hidden blood in the stool. Use of a small camera at the end of a tube, to directly examine the colon (sigmoidoscopy or colonoscopy), can detect the earliest forms of colorectal cancer. Talk to your health  care provider about this at age 95, when routine screening begins. Direct exam of the colon should be repeated every 5-10 years through age 64 years, unless early forms of pre-cancerous polyps or small growths are found.  People who are at an increased risk for hepatitis B should be screened for this virus. You are considered at high risk for hepatitis B if:  You were born in a country where hepatitis B occurs often. Talk with your health care provider about which countries are considered high risk.  Your parents were born in a high-risk country and you have not received a shot to protect against hepatitis B (hepatitis B vaccine).  You have HIV or AIDS.  You use needles to inject street drugs.  You live with, or have sex with, someone who has hepatitis B.  You get hemodialysis treatment.  You take certain medicines for conditions like cancer, organ transplantation, and autoimmune conditions.  Hepatitis C blood testing is recommended for all people born from [birth year] through 1965 and any individual with known risks for hepatitis C.  Practice safe sex. Use condoms and avoid high-risk sexual practices to reduce the spread of sexually transmitted infections (STIs). STIs include gonorrhea, chlamydia, syphilis, trichomonas, herpes, HPV, and human immunodeficiency virus (HIV). Herpes, HIV, and HPV are viral illnesses that have no cure. They can result in disability, cancer, and death.  You should be screened for sexually transmitted illnesses (STIs) including gonorrhea and chlamydia if:  You are sexually active and are younger than 24 years.  You are older than 24 years and your health care provider tells you that you are at risk for this type of infection.  Your sexual activity has changed since you were last screened and you are at an increased risk for chlamydia or gonorrhea. Ask your health care provider if you are at risk.  If you are at risk of being infected with HIV, it is recommended  that you take a prescription medicine daily to prevent HIV infection. This is called preexposure prophylaxis (PrEP). You are considered at risk if:  You are a heterosexual woman, are sexually active, and are at increased risk for HIV infection.  You take drugs by injection.  You are sexually active with a partner who has HIV.  Talk with your health care provider about whether you are at high risk of being infected with HIV. If you choose to begin PrEP, you should first be tested for HIV. You should then be tested every 3 months for as long as you are taking PrEP.  Osteoporosis  is a disease in which the bones lose minerals and strength with aging. This can result in serious bone fractures or breaks. The risk of osteoporosis can be identified using a bone density scan. Women ages 33 years and over and women at risk for fractures or osteoporosis should discuss screening with their health care providers. Ask your health care provider whether you should take a calcium supplement or vitamin D to reduce the rate of osteoporosis.  Menopause can be associated with physical symptoms and risks. Hormone replacement therapy is available to decrease symptoms and risks. You should talk to your health care provider about whether hormone replacement therapy is right for you.  Use sunscreen. Apply sunscreen liberally and repeatedly throughout the day. You should seek shade when your shadow is shorter than you. Protect yourself by wearing long sleeves, pants, a wide-brimmed hat, and sunglasses year round, whenever you are outdoors.  Once a month, do a whole body skin exam, using a mirror to look at the skin on your back. Tell your health care provider of new moles, moles that have irregular borders, moles that are larger than a pencil eraser, or moles that have changed in shape or color.  Stay current with required vaccines (immunizations).  Influenza vaccine. All adults should be immunized every year.  Tetanus,  diphtheria, and acellular pertussis (Td, Tdap) vaccine. Pregnant women should receive 1 dose of Tdap vaccine during each pregnancy. The dose should be obtained regardless of the length of time since the last dose. Immunization is preferred during the 27th-36th week of gestation. An adult who has not previously received Tdap or who does not know her vaccine status should receive 1 dose of Tdap. This initial dose should be followed by tetanus and diphtheria toxoids (Td) booster doses every 10 years. Adults with an unknown or incomplete history of completing a 3-dose immunization series with Td-containing vaccines should begin or complete a primary immunization series including a Tdap dose. Adults should receive a Td booster every 10 years.  Varicella vaccine. An adult without evidence of immunity to varicella should receive 2 doses or a second dose if she has previously received 1 dose. Pregnant females who do not have evidence of immunity should receive the first dose after pregnancy. This first dose should be obtained before leaving the health care facility. The second dose should be obtained 4-8 weeks after the first dose.  Human papillomavirus (HPV) vaccine. Females aged 13-26 years who have not received the vaccine previously should obtain the 3-dose series. The vaccine is not recommended for use in pregnant females. However, pregnancy testing is not needed before receiving a dose. If a female is found to be pregnant after receiving a dose, no treatment is needed. In that case, the remaining doses should be delayed until after the pregnancy. Immunization is recommended for any person with an immunocompromised condition through the age of 77 years if she did not get any or all doses earlier. During the 3-dose series, the second dose should be obtained 4-8 weeks after the first dose. The third dose should be obtained 24 weeks after the first dose and 16 weeks after the second dose.  Zoster vaccine. One dose  is recommended for adults aged 26 years or older unless certain conditions are present.  Measles, mumps, and rubella (MMR) vaccine. Adults born before [birth year] generally are considered immune to measles and mumps. Adults born in [birth year] or later should have 1 or more doses of MMR vaccine unless there is a contraindication  to the vaccine or there is laboratory evidence of immunity to each of the three diseases. A routine second dose of MMR vaccine should be obtained at least 28 days after the first dose for students attending postsecondary schools, health care workers, or international travelers. People who received inactivated measles vaccine or an unknown type of measles vaccine during 1963-1967 should receive 2 doses of MMR vaccine. People who received inactivated mumps vaccine or an unknown type of mumps vaccine before 1979 and are at high risk for mumps infection should consider immunization with 2 doses of MMR vaccine. For females of childbearing age, rubella immunity should be determined. If there is no evidence of immunity, females who are not pregnant should be vaccinated. If there is no evidence of immunity, females who are pregnant should delay immunization until after pregnancy. Unvaccinated health care workers born before [birth year] who lack laboratory evidence of measles, mumps, or rubella immunity or laboratory confirmation of disease should consider measles and mumps immunization with 2 doses of MMR vaccine or rubella immunization with 1 dose of MMR vaccine.  Pneumococcal 13-valent conjugate (PCV13) vaccine. When indicated, a person who is uncertain of her immunization history and has no record of immunization should receive the PCV13 vaccine. An adult aged 73 years or older who has certain medical conditions and has not been previously immunized should receive 1 dose of PCV13 vaccine. This PCV13 should be followed with a dose of pneumococcal polysaccharide (PPSV23) vaccine. The PPSV23 vaccine dose should be  obtained at least 8 weeks after the dose of PCV13 vaccine. An adult aged 79 years or older who has certain medical conditions and previously received 1 or more doses of PPSV23 vaccine should receive 1 dose of PCV13. The PCV13 vaccine dose should be obtained 1 or more years after the last PPSV23 vaccine dose.  Pneumococcal polysaccharide (PPSV23) vaccine. When PCV13 is also indicated, PCV13 should be obtained first. All adults aged 50 years and older should be immunized. An adult younger than age 58 years who has certain medical conditions should be immunized. Any person who resides in a nursing home or long-term care facility should be immunized. An adult smoker should be immunized. People with an immunocompromised condition and certain other conditions should receive both PCV13 and PPSV23 vaccines. People with human immunodeficiency virus (HIV) infection should be immunized as soon as possible after diagnosis. Immunization during chemotherapy or radiation therapy should be avoided. Routine use of PPSV23 vaccine is not recommended for American Indians, [place] Natives, or people younger than 65 years unless there are medical conditions that require PPSV23 vaccine. When indicated, people who have unknown immunization and have no record of immunization should receive PPSV23 vaccine. One-time revaccination 5 years after the first dose of PPSV23 is recommended for people aged 19-64 years who have chronic kidney failure, nephrotic syndrome, asplenia, or immunocompromised conditions. People who received 1-2 doses of PPSV23 before age 73 years should receive another dose of PPSV23 vaccine at age 9 years or later if at least 5 years have passed since the previous dose. Doses of PPSV23 are not needed for people immunized with PPSV23 at or after age 48 years.  Meningococcal vaccine. Adults with asplenia or persistent complement component deficiencies should receive 2 doses of quadrivalent meningococcal conjugate  (MenACWY-D) vaccine. The doses should be obtained at least 2 months apart. Microbiologists working with certain meningococcal bacteria, [employer] recruits, people at risk during an outbreak, and people who travel to or live in countries with a high rate of  meningitis should be immunized. A first-year college student up through age 21 years who is living in a residence hall should receive a dose if she did not receive a dose on or after her 16th birthday. Adults who have certain high-risk conditions should receive one or more doses of vaccine.  Hepatitis A vaccine. Adults who wish to be protected from this disease, have certain high-risk conditions, work with hepatitis A-infected animals, work in hepatitis A research labs, or travel to or work in countries with a high rate of hepatitis A should be immunized. Adults who were previously unvaccinated and who anticipate close contact with an international adoptee during the first 60 days after arrival in the [place] from a country with a high rate of hepatitis A should be immunized.  Hepatitis B vaccine. Adults who wish to be protected from this disease, have certain high-risk conditions, may be exposed to blood or other infectious body fluids, are household contacts or sex partners of hepatitis B positive people, are clients or workers in certain care facilities, or travel to or work in countries with a high rate of hepatitis B should be immunized.  Haemophilus influenzae type b (Hib) vaccine. A previously unvaccinated person with asplenia or sickle cell disease or having a scheduled splenectomy should receive 1 dose of Hib vaccine. Regardless of previous immunization, a recipient of a hematopoietic stem cell transplant should receive a 3-dose series 6-12 months after her successful transplant. Hib vaccine is not recommended for adults with HIV infection. Preventive Services / Frequency Ages 21 to 57 years  Blood pressure check.** / Every 1 to 2  years.  Lipid and cholesterol check.** / Every 5 years beginning at age 15.  Clinical breast exam.** / Every 3 years for women in their 59s and 7s.  BRCA-related cancer risk assessment.** / For women who have family members with a BRCA-related cancer (breast, ovarian, tubal, or peritoneal cancers).  Pap test.** / Every 2 years from ages 35 through 47. Every 3 years starting at age 58 through age 51 or 67 with a history of 3 consecutive normal Pap tests.  HPV screening.** / Every 3 years from ages 58 through ages 1 to 59 with a history of 3 consecutive normal Pap tests.  Hepatitis C blood test.** / For any individual with known risks for hepatitis C.  Skin self-exam. / Monthly.  Influenza vaccine. / Every year.  Tetanus, diphtheria, and acellular pertussis (Tdap, Td) vaccine.** / Consult your health care provider. Pregnant women should receive 1 dose of Tdap vaccine during each pregnancy. 1 dose of Td every 10 years.  Varicella vaccine.** / Consult your health care provider. Pregnant females who do not have evidence of immunity should receive the first dose after pregnancy.  HPV vaccine. / 3 doses over 6 months, if 58 and younger. The vaccine is not recommended for use in pregnant females. However, pregnancy testing is not needed before receiving a dose.  Measles, mumps, rubella (MMR) vaccine.** / You need at least 1 dose of MMR if you were born in 1957 or later. You may also need a 2nd dose. For females of childbearing age, rubella immunity should be determined. If there is no evidence of immunity, females who are not pregnant should be vaccinated. If there is no evidence of immunity, females who are pregnant should delay immunization until after pregnancy.  Pneumococcal 13-valent conjugate (PCV13) vaccine.** / Consult your health care provider.  Pneumococcal polysaccharide (PPSV23) vaccine.** / 1 to 2 doses if you  smoke cigarettes or if you have certain conditions.  Meningococcal  vaccine.** / 1 dose if you are age 23 to 11 years and a Market researcher living in a residence hall, or have one of several medical conditions, you need to get vaccinated against meningococcal disease. You may also need additional booster doses.  Hepatitis A vaccine.** / Consult your health care provider.  Hepatitis B vaccine.** / Consult your health care provider.  Haemophilus influenzae type b (Hib) vaccine.** / Consult your health care provider. Ages 4 to 75 years  Blood pressure check.** / Every 1 to 2 years.  Lipid and cholesterol check.** / Every 5 years beginning at age 57 years.  Lung cancer screening. / Every year if you are aged 64-80 years and have a 30-pack-year history of smoking and currently smoke or have quit within the past 15 years. Yearly screening is stopped once you have quit smoking for at least 15 years or develop a health problem that would prevent you from having lung cancer treatment.  Clinical breast exam.** / Every year after age 71 years.  BRCA-related cancer risk assessment.** / For women who have family members with a BRCA-related cancer (breast, ovarian, tubal, or peritoneal cancers).  Mammogram.** / Every year beginning at age 62 years and continuing for as long as you are in good health. Consult with your health care provider.  Pap test.** / Every 3 years starting at age 18 years through age 91 or 88 years with a history of 3 consecutive normal Pap tests.  HPV screening.** / Every 3 years from ages 62 years through ages 10 to 69 years with a history of 3 consecutive normal Pap tests.  Fecal occult blood test (FOBT) of stool. / Every year beginning at age 41 years and continuing until age 41 years. You may not need to do this test if you get a colonoscopy every 10 years.  Flexible sigmoidoscopy or colonoscopy.** / Every 5 years for a flexible sigmoidoscopy or every 10 years for a colonoscopy beginning at age 21 years and continuing until age 102  years.  Hepatitis C blood test.** / For all people born from [birth year] through 1963 and any individual with known risks for hepatitis C.  Skin self-exam. / Monthly.  Influenza vaccine. / Every year.  Tetanus, diphtheria, and acellular pertussis (Tdap/Td) vaccine.** / Consult your health care provider. Pregnant women should receive 1 dose of Tdap vaccine during each pregnancy. 1 dose of Td every 10 years.  Varicella vaccine.** / Consult your health care provider. Pregnant females who do not have evidence of immunity should receive the first dose after pregnancy.  Zoster vaccine.** / 1 dose for adults aged 6 years or older.  Measles, mumps, rubella (MMR) vaccine.** / You need at least 1 dose of MMR if you were born in 1957 or later. You may also need a 2nd dose. For females of childbearing age, rubella immunity should be determined. If there is no evidence of immunity, females who are not pregnant should be vaccinated. If there is no evidence of immunity, females who are pregnant should delay immunization until after pregnancy.  Pneumococcal 13-valent conjugate (PCV13) vaccine.** / Consult your health care provider.  Pneumococcal polysaccharide (PPSV23) vaccine.** / 1 to 2 doses if you smoke cigarettes or if you have certain conditions.  Meningococcal vaccine.** / Consult your health care provider.  Hepatitis A vaccine.** / Consult your health care provider.  Hepatitis B vaccine.** / Consult your health care provider.  Haemophilus influenzae  type b (Hib) vaccine.** / Consult your health care provider. Ages 34 years and over  Blood pressure check.** / Every 1 to 2 years.  Lipid and cholesterol check.** / Every 5 years beginning at age 76 years.  Lung cancer screening. / Every year if you are aged 57-80 years and have a 30-pack-year history of smoking and currently smoke or have quit within the past 15 years. Yearly screening is stopped once you have quit smoking for at least 15 years or  develop a health problem that would prevent you from having lung cancer treatment.  Clinical breast exam.** / Every year after age 57 years.  BRCA-related cancer risk assessment.** / For women who have family members with a BRCA-related cancer (breast, ovarian, tubal, or peritoneal cancers).  Mammogram.** / Every year beginning at age 40 years and continuing for as long as you are in good health. Consult with your health care provider.  Pap test.** / Every 3 years starting at age 49 years through age 60 or 66 years with 3 consecutive normal Pap tests. Testing can be stopped between 65 and 70 years with 3 consecutive normal Pap tests and no abnormal Pap or HPV tests in the past 10 years.  HPV screening.** / Every 3 years from ages 37 years through ages 36 or 86 years with a history of 3 consecutive normal Pap tests. Testing can be stopped between 65 and 70 years with 3 consecutive normal Pap tests and no abnormal Pap or HPV tests in the past 10 years.  Fecal occult blood test (FOBT) of stool. / Every year beginning at age 79 years and continuing until age 55 years. You may not need to do this test if you get a colonoscopy every 10 years.  Flexible sigmoidoscopy or colonoscopy.** / Every 5 years for a flexible sigmoidoscopy or every 10 years for a colonoscopy beginning at age 78 years and continuing until age 33 years.  Hepatitis C blood test.** / For all people born from [birth year] through 1963 and any individual with known risks for hepatitis C.  Osteoporosis screening.** / A one-time screening for women ages 59 years and over and women at risk for fractures or osteoporosis.  Skin self-exam. / Monthly.  Influenza vaccine. / Every year.  Tetanus, diphtheria, and acellular pertussis (Tdap/Td) vaccine.** / 1 dose of Td every 10 years.  Varicella vaccine.** / Consult your health care provider.  Zoster vaccine.** / 1 dose for adults aged 16 years or older.  Pneumococcal 13-valent conjugate  (PCV13) vaccine.** / Consult your health care provider.  Pneumococcal polysaccharide (PPSV23) vaccine.** / 1 dose for all adults aged 28 years and older.  Meningococcal vaccine.** / Consult your health care provider.  Hepatitis A vaccine.** / Consult your health care provider.  Hepatitis B vaccine.** / Consult your health care provider.  Haemophilus influenzae type b (Hib) vaccine.** / Consult your health care provider. ** Family history and personal history of risk and conditions may change your health care provider's recommendations. Document Released: 03/08/2001 Document Revised: 05/27/2013 Document Reviewed: 06/07/2010 [facility] Patient Information 2015 Carla Clark, Carla Clark. This information is not intended to replace advice given to you by your health care provider. Make sure you discuss any questions you have with your health care provider. Preventive Care for Adults A healthy lifestyle and preventive care can promote health and wellness. Preventive health guidelines for women include the following key practices.  A routine yearly physical is a good way to check with your health care provider about  your health and preventive screening. It is a chance to share any concerns and updates on your health and to receive a thorough exam.  Visit your dentist for a routine exam and preventive care every 6 months. Brush your teeth twice a day and floss once a day. Good oral hygiene prevents tooth decay and gum disease.  The frequency of eye exams is based on your age, health, family medical history, use of contact lenses, and other factors. Follow your health care provider's recommendations for frequency of eye exams.  Eat a healthy diet. Foods like vegetables, fruits, whole grains, low-fat dairy products, and lean protein foods contain the nutrients you need without too many calories. Decrease your intake of foods high in solid fats, added sugars, and salt. Eat the right amount of calories for you.Get  information about a proper diet from your health care provider, if necessary.  Regular physical exercise is one of the most important things you can do for your health. Most adults should get at least 150 minutes of moderate-intensity exercise (any activity that increases your heart rate and causes you to sweat) each week. In addition, most adults need muscle-strengthening exercises on 2 or more days a week.  Maintain a healthy weight. The body mass index (BMI) is a screening tool to identify possible weight problems. It provides an estimate of body fat based on height and weight. Your health care provider can find your BMI and can help you achieve or maintain a healthy weight.For adults 20 years and older:  A BMI below 18.5 is considered underweight.  A BMI of 18.5 to 24.9 is normal.  A BMI of 25 to 29.9 is considered overweight.  A BMI of 30 and above is considered obese.  Maintain normal blood lipids and cholesterol levels by exercising and minimizing your intake of saturated fat. Eat a balanced diet with plenty of fruit and vegetables. Blood tests for lipids and cholesterol should begin at age 74 and be repeated every 5 years. If your lipid or cholesterol levels are high, you are over 50, or you are at high risk for heart disease, you may need your cholesterol levels checked more frequently.Ongoing high lipid and cholesterol levels should be treated with medicines if diet and exercise are not working.  If you smoke, find out from your health care provider how to quit. If you do not use tobacco, do not start.  Lung cancer screening is recommended for adults aged 87-80 years who are at high risk for developing lung cancer because of a history of smoking. A yearly low-dose CT scan of the lungs is recommended for people who have at least a 30-pack-year history of smoking and are a current smoker or have quit within the past 15 years. A pack year of smoking is smoking an average of 1 pack of  cigarettes a day for 1 year (for example: 1 pack a day for 30 years or 2 packs a day for 15 years). Yearly screening should continue until the smoker has stopped smoking for at least 15 years. Yearly screening should be stopped for people who develop a health problem that would prevent them from having lung cancer treatment.  If you are pregnant, do not drink alcohol. If you are breastfeeding, be very cautious about drinking alcohol. If you are not pregnant and choose to drink alcohol, do not have more than 1 drink per day. One drink is considered to be 12 ounces (355 mL) of beer, 5 ounces (148 mL)  of wine, or 1.5 ounces (44 mL) of liquor.  Avoid use of street drugs. Do not share needles with anyone. Ask for help if you need support or instructions about stopping the use of drugs.  High blood pressure causes heart disease and increases the risk of stroke. Your blood pressure should be checked at least every 1 to 2 years. Ongoing high blood pressure should be treated with medicines if weight loss and exercise do not work.  If you are 53-46 years old, ask your health care provider if you should take aspirin to prevent strokes.  Diabetes screening involves taking a blood sample to check your fasting blood sugar level. This should be done once every 3 years, after age 7, if you are within normal weight and without risk factors for diabetes. Testing should be considered at a younger age or be carried out more frequently if you are overweight and have at least 1 risk factor for diabetes.  Breast cancer screening is essential preventive care for women. You should practice "breast self-awareness." This means understanding the normal appearance and feel of your breasts and may include breast self-examination. Any changes detected, no matter how small, should be reported to a health care provider. Women in their 64s and 30s should have a clinical breast exam (CBE) by a health care provider as part of a regular  health exam every 1 to 3 years. After age 38, women should have a CBE every year. Starting at age 7, women should consider having a mammogram (breast X-ray test) every year. Women who have a family history of breast cancer should talk to their health care provider about genetic screening. Women at a high risk of breast cancer should talk to their health care providers about having an MRI and a mammogram every year.  Breast cancer gene (BRCA)-related cancer risk assessment is recommended for women who have family members with BRCA-related cancers. BRCA-related cancers include breast, ovarian, tubal, and peritoneal cancers. Having family members with these cancers may be associated with an increased risk for harmful changes (mutations) in the breast cancer genes BRCA1 and BRCA2. Results of the assessment will determine the need for genetic counseling and BRCA1 and BRCA2 testing.  Routine pelvic exams to screen for cancer are no longer recommended for nonpregnant women who are considered low risk for cancer of the pelvic organs (ovaries, uterus, and vagina) and who do not have symptoms. Ask your health care provider if a screening pelvic exam is right for you.  If you have had past treatment for cervical cancer or a condition that could lead to cancer, you need Pap tests and screening for cancer for at least 20 years after your treatment. If Pap tests have been discontinued, your risk factors (such as having a new sexual partner) need to be reassessed to determine if screening should be resumed. Some women have medical problems that increase the chance of getting cervical cancer. In these cases, your health care provider may recommend more frequent screening and Pap tests.  The HPV test is an additional test that may be used for cervical cancer screening. The HPV test looks for the virus that can cause the cell changes on the cervix. The cells collected during the Pap test can be tested for HPV. The HPV test  could be used to screen women aged 37 years and older, and should be used in women of any age who have unclear Pap test results. After the age of 89, women should have HPV  testing at the same frequency as a Pap test.  Colorectal cancer can be detected and often prevented. Most routine colorectal cancer screening begins at the age of 21 years and continues through age 52 years. However, your health care provider may recommend screening at an earlier age if you have risk factors for colon cancer. On a yearly basis, your health care provider may provide home test kits to check for hidden blood in the stool. Use of a small camera at the end of a tube, to directly examine the colon (sigmoidoscopy or colonoscopy), can detect the earliest forms of colorectal cancer. Talk to your health care provider about this at age 18, when routine screening begins. Direct exam of the colon should be repeated every 5-10 years through age 61 years, unless early forms of pre-cancerous polyps or small growths are found.  People who are at an increased risk for hepatitis B should be screened for this virus. You are considered at high risk for hepatitis B if:  You were born in a country where hepatitis B occurs often. Talk with your health care provider about which countries are considered high risk.  Your parents were born in a high-risk country and you have not received a shot to protect against hepatitis B (hepatitis B vaccine).  You have HIV or AIDS.  You use needles to inject street drugs.  You live with, or have sex with, someone who has hepatitis B.  You get hemodialysis treatment.  You take certain medicines for conditions like cancer, organ transplantation, and autoimmune conditions.  Hepatitis C blood testing is recommended for all people born from [birth year] through 1963 and any individual with known risks for hepatitis C.  Practice safe sex. Use condoms and avoid high-risk sexual practices to reduce the spread of  sexually transmitted infections (STIs). STIs include gonorrhea, chlamydia, syphilis, trichomonas, herpes, HPV, and human immunodeficiency virus (HIV). Herpes, HIV, and HPV are viral illnesses that have no cure. They can result in disability, cancer, and death.  You should be screened for sexually transmitted illnesses (STIs) including gonorrhea and chlamydia if:  You are sexually active and are younger than 24 years.  You are older than 24 years and your health care provider tells you that you are at risk for this type of infection.  Your sexual activity has changed since you were last screened and you are at an increased risk for chlamydia or gonorrhea. Ask your health care provider if you are at risk.  If you are at risk of being infected with HIV, it is recommended that you take a prescription medicine daily to prevent HIV infection. This is called preexposure prophylaxis (PrEP). You are considered at risk if:  You are a heterosexual woman, are sexually active, and are at increased risk for HIV infection.  You take drugs by injection.  You are sexually active with a partner who has HIV.  Talk with your health care provider about whether you are at high risk of being infected with HIV. If you choose to begin PrEP, you should first be tested for HIV. You should then be tested every 3 months for as long as you are taking PrEP.  Osteoporosis is a disease in which the bones lose minerals and strength with aging. This can result in serious bone fractures or breaks. The risk of osteoporosis can be identified using a bone density scan. Women ages 15 years and over and women at risk for fractures or osteoporosis should discuss screening with their  health care providers. Ask your health care provider whether you should take a calcium supplement or vitamin D to reduce the rate of osteoporosis.  Menopause can be associated with physical symptoms and risks. Hormone replacement therapy is available to  decrease symptoms and risks. You should talk to your health care provider about whether hormone replacement therapy is right for you.  Use sunscreen. Apply sunscreen liberally and repeatedly throughout the day. You should seek shade when your shadow is shorter than you. Protect yourself by wearing long sleeves, pants, a wide-brimmed hat, and sunglasses year round, whenever you are outdoors.  Once a month, do a whole body skin exam, using a mirror to look at the skin on your back. Tell your health care provider of new moles, moles that have irregular borders, moles that are larger than a pencil eraser, or moles that have changed in shape or color.  Stay current with required vaccines (immunizations).  Influenza vaccine. All adults should be immunized every year.  Tetanus, diphtheria, and acellular pertussis (Td, Tdap) vaccine. Pregnant women should receive 1 dose of Tdap vaccine during each pregnancy. The dose should be obtained regardless of the length of time since the last dose. Immunization is preferred during the 27th-36th week of gestation. An adult who has not previously received Tdap or who does not know her vaccine status should receive 1 dose of Tdap. This initial dose should be followed by tetanus and diphtheria toxoids (Td) booster doses every 10 years. Adults with an unknown or incomplete history of completing a 3-dose immunization series with Td-containing vaccines should begin or complete a primary immunization series including a Tdap dose. Adults should receive a Td booster every 10 years.  Varicella vaccine. An adult without evidence of immunity to varicella should receive 2 doses or a second dose if she has previously received 1 dose. Pregnant females who do not have evidence of immunity should receive the first dose after pregnancy. This first dose should be obtained before leaving the health care facility. The second dose should be obtained 4-8 weeks after the first dose.  Human  papillomavirus (HPV) vaccine. Females aged 13-26 years who have not received the vaccine previously should obtain the 3-dose series. The vaccine is not recommended for use in pregnant females. However, pregnancy testing is not needed before receiving a dose. If a female is found to be pregnant after receiving a dose, no treatment is needed. In that case, the remaining doses should be delayed until after the pregnancy. Immunization is recommended for any person with an immunocompromised condition through the age of 91 years if she did not get any or all doses earlier. During the 3-dose series, the second dose should be obtained 4-8 weeks after the first dose. The third dose should be obtained 24 weeks after the first dose and 16 weeks after the second dose.  Zoster vaccine. One dose is recommended for adults aged 41 years or older unless certain conditions are present.  Measles, mumps, and rubella (MMR) vaccine. Adults born before [birth year] generally are considered immune to measles and mumps. Adults born in [birth year] or later should have 1 or more doses of MMR vaccine unless there is a contraindication to the vaccine or there is laboratory evidence of immunity to each of the three diseases. A routine second dose of MMR vaccine should be obtained at least 28 days after the first dose for students attending postsecondary schools, health care workers, or international travelers. People who received inactivated measles vaccine or an  unknown type of measles vaccine during 1963-1967 should receive 2 doses of MMR vaccine. People who received inactivated mumps vaccine or an unknown type of mumps vaccine before 1979 and are at high risk for mumps infection should consider immunization with 2 doses of MMR vaccine. For females of childbearing age, rubella immunity should be determined. If there is no evidence of immunity, females who are not pregnant should be vaccinated. If there is no evidence of immunity, females who are pregnant  should delay immunization until after pregnancy. Unvaccinated health care workers born before [birth year] who lack laboratory evidence of measles, mumps, or rubella immunity or laboratory confirmation of disease should consider measles and mumps immunization with 2 doses of MMR vaccine or rubella immunization with 1 dose of MMR vaccine.  Pneumococcal 13-valent conjugate (PCV13) vaccine. When indicated, a person who is uncertain of her immunization history and has no record of immunization should receive the PCV13 vaccine. An adult aged 26 years or older who has certain medical conditions and has not been previously immunized should receive 1 dose of PCV13 vaccine. This PCV13 should be followed with a dose of pneumococcal polysaccharide (PPSV23) vaccine. The PPSV23 vaccine dose should be obtained at least 8 weeks after the dose of PCV13 vaccine. An adult aged 45 years or older who has certain medical conditions and previously received 1 or more doses of PPSV23 vaccine should receive 1 dose of PCV13. The PCV13 vaccine dose should be obtained 1 or more years after the last PPSV23 vaccine dose.  Pneumococcal polysaccharide (PPSV23) vaccine. When PCV13 is also indicated, PCV13 should be obtained first. All adults aged 75 years and older should be immunized. An adult younger than age 66 years who has certain medical conditions should be immunized. Any person who resides in a nursing home or long-term care facility should be immunized. An adult smoker should be immunized. People with an immunocompromised condition and certain other conditions should receive both PCV13 and PPSV23 vaccines. People with human immunodeficiency virus (HIV) infection should be immunized as soon as possible after diagnosis. Immunization during chemotherapy or radiation therapy should be avoided. Routine use of PPSV23 vaccine is not recommended for American Indians, [place], or people younger than 65 years unless there are medical conditions  that require PPSV23 vaccine. When indicated, people who have unknown immunization and have no record of immunization should receive PPSV23 vaccine. One-time revaccination 5 years after the first dose of PPSV23 is recommended for people aged 19-64 years who have chronic kidney failure, nephrotic syndrome, asplenia, or immunocompromised conditions. People who received 1-2 doses of PPSV23 before age 1 years should receive another dose of PPSV23 vaccine at age 38 years or later if at least 5 years have passed since the previous dose. Doses of PPSV23 are not needed for people immunized with PPSV23 at or after age 72 years.  Meningococcal vaccine. Adults with asplenia or persistent complement component deficiencies should receive 2 doses of quadrivalent meningococcal conjugate (MenACWY-D) vaccine. The doses should be obtained at least 2 months apart. Microbiologists working with certain meningococcal bacteria, [place] recruits, people at risk during an outbreak, and people who travel to or live in countries with a high rate of meningitis should be immunized. A first-year college student up through age 33 years who is living in a residence hall should receive a dose if she did not receive a dose on or after her 16th birthday. Adults who have certain high-risk conditions should receive one or more doses of vaccine.  Hepatitis  A vaccine. Adults who wish to be protected from this disease, have certain high-risk conditions, work with hepatitis A-infected animals, work in hepatitis A research labs, or travel to or work in countries with a high rate of hepatitis A should be immunized. Adults who were previously unvaccinated and who anticipate close contact with an international adoptee during the first 60 days after arrival in the [place] from a country with a high rate of hepatitis A should be immunized.  Hepatitis B vaccine. Adults who wish to be protected from this disease, have certain high-risk conditions, may  be exposed to blood or other infectious body fluids, are household contacts or sex partners of hepatitis B positive people, are clients or workers in certain care facilities, or travel to or work in countries with a high rate of hepatitis B should be immunized.  Haemophilus influenzae type b (Hib) vaccine. A previously unvaccinated person with asplenia or sickle cell disease or having a scheduled splenectomy should receive 1 dose of Hib vaccine. Regardless of previous immunization, a recipient of a hematopoietic stem cell transplant should receive a 3-dose series 6-12 months after her successful transplant. Hib vaccine is not recommended for adults with HIV infection. Preventive Services / Frequency Ages 75 to 13 years  Blood pressure check.** / Every 1 to 2 years.  Lipid and cholesterol check.** / Every 5 years beginning at age 53.  Clinical breast exam.** / Every 3 years for women in their 38s and 87s.  BRCA-related cancer risk assessment.** / For women who have family members with a BRCA-related cancer (breast, ovarian, tubal, or peritoneal cancers).  Pap test.** / Every 2 years from ages 46 through 110. Every 3 years starting at age 33 through age 73 or 79 with a history of 3 consecutive normal Pap tests.  HPV screening.** / Every 3 years from ages 64 through ages 96 to 5 with a history of 3 consecutive normal Pap tests.  Hepatitis C blood test.** / For any individual with known risks for hepatitis C.  Skin self-exam. / Monthly.  Influenza vaccine. / Every year.  Tetanus, diphtheria, and acellular pertussis (Tdap, Td) vaccine.** / Consult your health care provider. Pregnant women should receive 1 dose of Tdap vaccine during each pregnancy. 1 dose of Td every 10 years.  Varicella vaccine.** / Consult your health care provider. Pregnant females who do not have evidence of immunity should receive the first dose after pregnancy.  HPV vaccine. / 3 doses over 6 months, if 58 and younger.  The vaccine is not recommended for use in pregnant females. However, pregnancy testing is not needed before receiving a dose.  Measles, mumps, rubella (MMR) vaccine.** / You need at least 1 dose of MMR if you were born in 1957 or later. You may also need a 2nd dose. For females of childbearing age, rubella immunity should be determined. If there is no evidence of immunity, females who are not pregnant should be vaccinated. If there is no evidence of immunity, females who are pregnant should delay immunization until after pregnancy.  Pneumococcal 13-valent conjugate (PCV13) vaccine.** / Consult your health care provider.  Pneumococcal polysaccharide (PPSV23) vaccine.** / 1 to 2 doses if you smoke cigarettes or if you have certain conditions.  Meningococcal vaccine.** / 1 dose if you are age 63 to 81 years and a Market researcher living in a residence hall, or have one of several medical conditions, you need to get vaccinated against meningococcal disease. You may also need additional  booster doses.  Hepatitis A vaccine.** / Consult your health care provider.  Hepatitis B vaccine.** / Consult your health care provider.  Haemophilus influenzae type b (Hib) vaccine.** / Consult your health care provider. Ages 21 to 4 years  Blood pressure check.** / Every 1 to 2 years.  Lipid and cholesterol check.** / Every 5 years beginning at age 58 years.  Lung cancer screening. / Every year if you are aged 40-80 years and have a 30-pack-year history of smoking and currently smoke or have quit within the past 15 years. Yearly screening is stopped once you have quit smoking for at least 15 years or develop a health problem that would prevent you from having lung cancer treatment.  Clinical breast exam.** / Every year after age 54 years.  BRCA-related cancer risk assessment.** / For women who have family members with a BRCA-related cancer (breast, ovarian, tubal, or peritoneal  cancers).  Mammogram.** / Every year beginning at age 39 years and continuing for as long as you are in good health. Consult with your health care provider.  Pap test.** / Every 3 years starting at age 23 years through age 5 or 67 years with a history of 3 consecutive normal Pap tests.  HPV screening.** / Every 3 years from ages 74 years through ages 31 to 54 years with a history of 3 consecutive normal Pap tests.  Fecal occult blood test (FOBT) of stool. / Every year beginning at age 35 years and continuing until age 52 years. You may not need to do this test if you get a colonoscopy every 10 years.  Flexible sigmoidoscopy or colonoscopy.** / Every 5 years for a flexible sigmoidoscopy or every 10 years for a colonoscopy beginning at age 55 years and continuing until age 64 years.  Hepatitis C blood test.** / For all people born from [birth year] through 1965 and any individual with known risks for hepatitis C.  Skin self-exam. / Monthly.  Influenza vaccine. / Every year.  Tetanus, diphtheria, and acellular pertussis (Tdap/Td) vaccine.** / Consult your health care provider. Pregnant women should receive 1 dose of Tdap vaccine during each pregnancy. 1 dose of Td every 10 years.  Varicella vaccine.** / Consult your health care provider. Pregnant females who do not have evidence of immunity should receive the first dose after pregnancy.  Zoster vaccine.** / 1 dose for adults aged 64 years or older.  Measles, mumps, rubella (MMR) vaccine.** / You need at least 1 dose of MMR if you were born in 1957 or later. You may also need a 2nd dose. For females of childbearing age, rubella immunity should be determined. If there is no evidence of immunity, females who are not pregnant should be vaccinated. If there is no evidence of immunity, females who are pregnant should delay immunization until after pregnancy.  Pneumococcal 13-valent conjugate (PCV13) vaccine.** / Consult your health care  provider.  Pneumococcal polysaccharide (PPSV23) vaccine.** / 1 to 2 doses if you smoke cigarettes or if you have certain conditions.  Meningococcal vaccine.** / Consult your health care provider.  Hepatitis A vaccine.** / Consult your health care provider.  Hepatitis B vaccine.** / Consult your health care provider.  Haemophilus influenzae type b (Hib) vaccine.** / Consult your health care provider. Ages 72 years and over  Blood pressure check.** / Every 1 to 2 years.  Lipid and cholesterol check.** / Every 5 years beginning at age 39 years.  Lung cancer screening. / Every year if you are aged 93-80 years  and have a 30-pack-year history of smoking and currently smoke or have quit within the past 15 years. Yearly screening is stopped once you have quit smoking for at least 15 years or develop a health problem that would prevent you from having lung cancer treatment.  Clinical breast exam.** / Every year after age 42 years.  BRCA-related cancer risk assessment.** / For women who have family members with a BRCA-related cancer (breast, ovarian, tubal, or peritoneal cancers).  Mammogram.** / Every year beginning at age 29 years and continuing for as long as you are in good health. Consult with your health care provider.  Pap test.** / Every 3 years starting at age 61 years through age 70 or 49 years with 3 consecutive normal Pap tests. Testing can be stopped between 65 and 70 years with 3 consecutive normal Pap tests and no abnormal Pap or HPV tests in the past 10 years.  HPV screening.** / Every 3 years from ages 9 years through ages 74 or 50 years with a history of 3 consecutive normal Pap tests. Testing can be stopped between 65 and 70 years with 3 consecutive normal Pap tests and no abnormal Pap or HPV tests in the past 10 years.  Fecal occult blood test (FOBT) of stool. / Every year beginning at age 14 years and continuing until age 61 years. You may not need to do this test if you get  a colonoscopy every 10 years.  Flexible sigmoidoscopy or colonoscopy.** / Every 5 years for a flexible sigmoidoscopy or every 10 years for a colonoscopy beginning at age 9 years and continuing until age 39 years.  Hepatitis C blood test.** / For all people born from [birth year] through 1965 and any individual with known risks for hepatitis C.  Osteoporosis screening.** / A one-time screening for women ages 15 years and over and women at risk for fractures or osteoporosis.  Skin self-exam. / Monthly.  Influenza vaccine. / Every year.  Tetanus, diphtheria, and acellular pertussis (Tdap/Td) vaccine.** / 1 dose of Td every 10 years.  Varicella vaccine.** / Consult your health care provider.  Zoster vaccine.** / 1 dose for adults aged 42 years or older.  Pneumococcal 13-valent conjugate (PCV13) vaccine.** / Consult your health care provider.  Pneumococcal polysaccharide (PPSV23) vaccine.** / 1 dose for all adults aged 31 years and older.  Meningococcal vaccine.** / Consult your health care provider.  Hepatitis A vaccine.** / Consult your health care provider.  Hepatitis B vaccine.** / Consult your health care provider.  Haemophilus influenzae type b (Hib) vaccine.** / Consult your health care provider. ** Family history and personal history of risk and conditions may change your health care provider's recommendations. Document Released: 03/08/2001 Document Revised: 05/27/2013 Document Reviewed: 06/07/2010 [facility] Patient Information 2015 [place], Carla Clark. This information is not intended to replace advice given to you by your health care provider. Make sure you discuss any questions you have with your health care provider.

## 2014-01-20 NOTE — Assessment & Plan Note (Signed)
Avoid offending foods, start probiotics. Do not eat large meals in late evening and consider raising head of bed.  

## 2014-01-20 NOTE — Assessment & Plan Note (Signed)
Patient encouraged to maintain heart healthy diet, regular exercise, adequate sleep. Consider daily probiotics. Take medications as prescribed 

## 2014-01-20 NOTE — Assessment & Plan Note (Addendum)
Resolved, has not needed IV iron since January 2015, no new concerns.

## 2014-01-20 NOTE — Progress Notes (Signed)
Carla Clark  [id number] March 07, 1985 01/20/2014      Progress Note-Follow Up  Subjective  Chief Complaint  Chief Complaint  Patient presents with  . Annual Exam    no pap smear    HPI  Patient is a 28 y.o. female in today for routine medical care. Patient in for annual exam. Overall doing well. Has established her gynecology care with Dr. Rowe Clack and was seen in August. Has had her first Gardasil shots. No trouble with her Pap smear but she did have some bacterial vaginosis and she feels her irritation has returned. Denies any significant discharge, fevers or chills or abdominal pain. Otherwise denies recent illness. Notes her Crohn's disease is well controlled at the present time but she is unclear if it is due to Humira or dietary changes. Denies CP/palp/SOB/HA/congestion/fevers/GI or GU c/o. Taking meds as prescribed  Past Medical History  Diagnosis Date  . Chicken pox as a child  . Colitis 11/06/2011  . Preventative health care 11/06/2011  . Anemia 01/27/2012  . Contraceptive management 01/27/2012  . Breast mass in female 04/21/2012  . Crohn's disease   . Cervical cancer screening 01/27/2012  . Other specified iron deficiency anemias 01/21/2013    Past Surgical History  Procedure Laterality Date  . Wisdom tooth extraction  28 yrs old    Family History  Problem Relation Age of Onset  . Nephrolithiasis Father   . Lung cancer Maternal Grandmother   . Gout Maternal Grandfather   . Ovarian cancer Paternal Grandmother   . Cancer Paternal Grandmother     ovarian  . Pancreatic cancer Paternal Grandfather   . Cancer Paternal Aunt     ovarian    History   Social History  . Marital Status: Single    Spouse Name: N/A    Number of Children: N/A  . Years of Education: N/A   Occupational History  . teacher    Social History Main Topics  . Smoking status: Never Smoker   . Smokeless tobacco: Never Used     Comment: never used tobacco  . Alcohol Use: Yes     Comment:  occasionaly  . Drug Use: No  . Sexual Activity:    Partners: Male   Other Topics Concern  . Not on file   Social History Narrative    Current Outpatient Prescriptions on File Prior to Visit  Medication Sig Dispense Refill  . Cetirizine HCl (ZYRTEC ALLERGY PO) Take by mouth 2 (two) times daily.    Marland Kitchen desogestrel-ethinyl estradiol (VIORELE) 0.15-0.02/0.01 MG (21/5) tablet Take 1 tablet by mouth daily. 84 tablet 0  . diphenhydrAMINE (BENADRYL) 25 mg capsule Take 25 mg by mouth at bedtime as needed.     Marland Kitchen HUMIRA PEN 40 MG/0.8ML PNKT INJECT 1 PEN (40 MG) UNDER THE SKIN EVERY 14 DAYS 2 each 0  . Probiotic Product (MISC INTESTINAL FLORA REGULAT) CHEW Chew by mouth every other day. Digestive Health probiotic gummies by [employer]     No current facility-administered medications on file prior to visit.    No Known Allergies  Review of Systems  Review of Systems  Constitutional: Negative for fever, chills and malaise/fatigue.  HENT: Negative for congestion, hearing loss and nosebleeds.   Eyes: Negative for discharge.  Respiratory: Negative for cough, sputum production, shortness of breath and wheezing.   Cardiovascular: Negative for chest pain, palpitations and leg swelling.  Gastrointestinal: Negative for heartburn, nausea, vomiting, abdominal pain, diarrhea, constipation and blood in stool.  Genitourinary: Negative  for dysuria, urgency, frequency and hematuria.  Musculoskeletal: Negative for myalgias, back pain and falls.  Skin: Negative for rash.  Neurological: Negative for dizziness, tremors, sensory change, focal weakness, loss of consciousness, weakness and headaches.  Endo/Heme/Allergies: Negative for polydipsia. Does not bruise/bleed easily.  Psychiatric/Behavioral: Negative for depression and suicidal ideas. The patient is not nervous/anxious and does not have insomnia.     Objective  BP 125/83 mmHg  Pulse 86  Temp(Src) 98.7 F (37.1 C) (Oral)  Ht 5' 3.5" (1.613 m)  Wt 144  lb 9.6 oz (65.59 kg)  BMI 25.21 kg/m2  SpO2 100%  LMP 01/05/2014  Physical Exam  Physical Exam  Constitutional: She is oriented to person, place, and time and well-developed, well-nourished, and in no distress. No distress.  HENT:  Head: Normocephalic and atraumatic.  Right Ear: External ear normal.  Left Ear: External ear normal.  Nose: Nose normal.  Mouth/Throat: Oropharynx is clear and moist. No oropharyngeal exudate.  Eyes: Conjunctivae are normal. Pupils are equal, round, and reactive to light. Right eye exhibits no discharge. Left eye exhibits no discharge. No scleral icterus.  Neck: Normal range of motion. Neck supple. No thyromegaly present.  Cardiovascular: Normal rate, regular rhythm, normal heart sounds and intact distal pulses.   No murmur heard. Pulmonary/Chest: Effort normal and breath sounds normal. No respiratory distress. She has no wheezes. She has no rales.  Abdominal: Soft. Bowel sounds are normal. She exhibits no distension and no mass. There is no tenderness.  Musculoskeletal: Normal range of motion. She exhibits no edema or tenderness.  Lymphadenopathy:    She has no cervical adenopathy.  Neurological: She is alert and oriented to person, place, and time. She has normal reflexes. No cranial nerve deficit. Coordination normal.  Skin: Skin is warm and dry. No rash noted. She is not diaphoretic.  Psychiatric: Mood, memory and affect normal.    Lab Results  Component Value Date   TSH 1.529 08/20/2012   Lab Results  Component Value Date   WBC 6.7 01/13/2014   HGB 13.3 01/13/2014   HCT 41.1 01/13/2014   MCV 90 01/13/2014   PLT 252 01/13/2014   Lab Results  Component Value Date   CREATININE 0.62 08/20/2012   BUN 8 08/20/2012   NA 141 08/20/2012   K 4.1 08/20/2012   CL 106 08/20/2012   CO2 27 08/20/2012   Lab Results  Component Value Date   ALT 11 08/20/2012   AST 11 08/20/2012   ALKPHOS 51 08/20/2012   BILITOT 0.2* 08/20/2012   Lab Results    Component Value Date   CHOL 129 08/20/2012   Lab Results  Component Value Date   HDL 53 08/20/2012   Lab Results  Component Value Date   LDLCALC 59 08/20/2012   Lab Results  Component Value Date   TRIG 84 08/20/2012   Lab Results  Component Value Date   CHOLHDL 2.4 08/20/2012     Assessment & Plan  Gastroesophageal reflux disease without esophagitis Avoid offending foods, start probiotics. Do not eat large meals in late evening and consider raising head of bed.   Preventative health care Patient encouraged to maintain heart healthy diet, regular exercise, adequate sleep. Consider daily probiotics. Take medications as prescribed  Crohn's disease Avoids nuts, tomatoes and kale and this helps. Doing well on Humira will continue the same  Other iron deficiency anemias Resolved, has not needed IV iron since January 2015, no new concerns.  Allergic dermatitis Chronic urticaria doing better on  Zyrtec  But still flares at times. Continue Zyrtec and Benadryl and encouraged to add a H2 blocker such as Pepcid or Zantac. Is seeing a dermatologist  Vaginitis and vulvovaginitis Diagnosed with BV by her gynecologist this year and feels her irritation has returned. Encouraged probiotics, avoid harsh soaps, cleanse with Distilled white vinegar and testing obtained today, await results

## 2014-01-20 NOTE — Assessment & Plan Note (Signed)
Chronic urticaria doing better on Zyrtec  But still flares at times. Continue Zyrtec and Benadryl and encouraged to add a H2 blocker such as Pepcid or Zantac. Is seeing a dermatologist

## 2014-01-20 NOTE — Progress Notes (Signed)
Pre visit review using our clinic review tool, if applicable. No additional management support is needed unless otherwise documented below in the visit note. 

## 2014-01-21 DIAGNOSIS — N760 Acute vaginitis: Secondary | ICD-10-CM | POA: Insufficient documentation

## 2014-01-21 NOTE — Assessment & Plan Note (Signed)
Diagnosed with BV by her gynecologist this year and feels her irritation has returned. Encouraged probiotics, avoid harsh soaps, cleanse with Distilled white vinegar and testing obtained today, await results

## 2014-01-23 LAB — URINE CYTOLOGY ANCILLARY ONLY
Bacterial vaginitis: NEGATIVE
Candida vaginitis: NEGATIVE

## 2014-02-11 ENCOUNTER — Other Ambulatory Visit: Payer: Self-pay | Admitting: Internal Medicine

## 2014-02-18 ENCOUNTER — Encounter: Payer: Self-pay | Admitting: *Deleted

## 2014-03-03 ENCOUNTER — Ambulatory Visit (INDEPENDENT_AMBULATORY_CARE_PROVIDER_SITE_OTHER): Payer: BC Managed Care – PPO | Admitting: Internal Medicine

## 2014-03-03 ENCOUNTER — Encounter: Payer: Self-pay | Admitting: Internal Medicine

## 2014-03-03 VITALS — BP 110/68 | HR 77 | Ht 63.0 in | Wt 140.2 lb

## 2014-03-03 DIAGNOSIS — D509 Iron deficiency anemia, unspecified: Secondary | ICD-10-CM

## 2014-03-03 DIAGNOSIS — E559 Vitamin D deficiency, unspecified: Secondary | ICD-10-CM

## 2014-03-03 DIAGNOSIS — K50019 Crohn's disease of small intestine with unspecified complications: Secondary | ICD-10-CM

## 2014-03-03 NOTE — Progress Notes (Signed)
Subjective:    Patient ID: Carla Clark, female    DOB: Mar 10, 1985, 29 y.o.   MRN: [id number]  HPI Carla Clark is a 29 year old female with a past medical history of fistulizing ileal Crohn's, iron deficiency anemia who seen in follow-up. She was last seen in August 2015. She has been following with Dr. Marin Olp and her last IV iron infusion was in January 2015. She was last seen in December and iron studies and blood counts were normal and thus IV iron was not administered. She reports on the whole she has been feeling very well. She is taking Humira as prescribed 40 mg every 14 days. Last injection was 10 days ago. No injection site inflammation or problems. She did have several days of cramping lower abdominal pain and nonbloody loose stools last week. She associates this with something that she ate and symptoms have completely resolved. She denies abdominal pain today. Bowel movements have been regular for her, formed and nonbloody. Appetite is good. No nausea or vomiting. No fevers chills or night sweats. Energy levels are good. She did develop rash which was pruritic and diffuse in September. Was seen by primary care and referred to dermatology. This was diagnosed as "hives". She's been taking Zyrtec twice daily and Benadryl at bedtime. It has helped with the rash. She reports her dermatologist did not relate this rash to Humira. She has slowly decreased Zyrtec but is still using it once daily. No new joint pains, oral ulcers or ocular pain. Rare heartburn without dysphagia or odynophagia.   Review of Systems As per history of present illness, otherwise negative  Current Medications, Allergies, Past Medical History, Past Surgical History, Family History and Social History were reviewed in [employer] record.     Objective:   Physical Exam BP 110/68 mmHg  Pulse 77  Ht 5' 3"  (1.6 m)  Wt 140 lb 3.2 oz (63.594 kg)  BMI 24.84 kg/m2  SpO2 98% Constitutional:  Well-developed and well-nourished. No distress. HEENT: Normocephalic and atraumatic. Oropharynx is clear and moist. No oropharyngeal exudate. Conjunctivae are normal.  No scleral icterus. Neck: Neck supple. Trachea midline. Cardiovascular: Normal rate, regular rhythm and intact distal pulses. No M/R/G Pulmonary/chest: Effort normal and breath sounds normal. No wheezing, rales or rhonchi. Abdominal: Soft, nontender, nondistended. Bowel sounds active throughout. There are no masses palpable. No hepatosplenomegaly. Extremities: no clubbing, cyanosis, or edema Lymphadenopathy: No cervical adenopathy noted. Neurological: Alert and oriented to person place and time. Skin: Skin is warm and dry. No rashes noted. Psychiatric: Normal mood and affect. Behavior is normal.  CBC    Component Value Date/Time   WBC 6.7 01/13/2014 1132   WBC 6.9 12/03/2012 0953   RBC 4.57 01/13/2014 1132   RBC 4.55 01/13/2014 1132   RBC 4.63 12/03/2012 0953   HGB 13.3 01/13/2014 1132   HGB 11.5* 12/03/2012 0953   HCT 41.1 01/13/2014 1132   HCT 34.8* 12/03/2012 0953   PLT 252 01/13/2014 1132   PLT 296.0 12/03/2012 0953   MCV 90 01/13/2014 1132   MCV 75.2* 12/03/2012 0953   MCH 29.1 01/13/2014 1132   MCH 24.0* 08/20/2012 1139   MCHC 32.4 01/13/2014 1132   MCHC 33.0 12/03/2012 0953   RDW 12.4 01/13/2014 1132   RDW 18.1* 12/03/2012 0953   LYMPHSABS 2.7 01/13/2014 1132   LYMPHSABS 1.2 08/07/2012 0942   MONOABS 0.9 08/07/2012 0942   EOSABS 0.3 01/13/2014 1132   EOSABS 0.3 08/07/2012 0942   BASOSABS 0.0 01/13/2014  1132   BASOSABS 0.1 08/07/2012 0942    CMP     Component Value Date/Time   NA 141 08/20/2012 1139   K 4.1 08/20/2012 1139   CL 106 08/20/2012 1139   CO2 27 08/20/2012 1139   GLUCOSE 63* 08/20/2012 1139   BUN 8 08/20/2012 1139   CREATININE 0.62 08/20/2012 1139   CREATININE 0.7 11/29/2011 1430   CALCIUM 9.3 08/20/2012 1139   PROT 6.9 08/20/2012 1139   ALBUMIN 3.6 08/20/2012 1139   ALBUMIN 3.6  08/20/2012 1139   AST 11 08/20/2012 1139   ALT 11 08/20/2012 1139   ALKPHOS 51 08/20/2012 1139   BILITOT 0.2* 08/20/2012 1139   GFRNONAA >90 11/01/2011 0920   GFRAA >90 11/01/2011 0920    Iron/TIBC/Ferritin/ %Sat    Component Value Date/Time   IRON 83 01/13/2014 1132   IRON 56 12/03/2012 0953   TIBC 309 01/13/2014 1132   FERRITIN 60 01/13/2014 1132   FERRITIN 7.9* 12/03/2012 0953   IRONPCTSAT 27 01/13/2014 1132   IRONPCTSAT 13.3* 12/03/2012 0953   Colonoscopy and CT enterography reviewed (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  1. Ileal Crohn's -- clinical remission on Humira. Continue Humira 40 mg every 14 days. Fortunately she has been able to avoid steroids for the last nearly 2 years. We'll check labs today to include CMP, CRP, B12 and vitamin D. I would like to see her in one year for this issue, certainly sooner should she develop abdominal pain, diarrhea, blood in her stool or any signs or symptoms of flare. She voices understanding and is happy with this plan. Quantiferon gold testing today as well. -vaccines up to date  2. IDA -- resolved, follows with hematology. Last IV iron was 13 months ago. Highly indicative of mucosal healing and Crohn's disease  3. History of vitamin D deficiency -- recheck vitamin D today, previously supplemented with high-dose vitamin D.

## 2014-03-03 NOTE — Patient Instructions (Signed)
Your physician has requested that you go to the basement for the following lab work before leaving today: CBC, CMP, CRP, B12, Vitamin D, TB gold  Please contiue Humira as prescribed.  Please follow with Dr Hilarie Fredrickson in 1 year.  CC:Dr Penni Homans

## 2014-03-04 ENCOUNTER — Other Ambulatory Visit: Payer: Self-pay | Admitting: Family

## 2014-03-07 ENCOUNTER — Other Ambulatory Visit: Payer: Self-pay | Admitting: Internal Medicine

## 2014-03-21 ENCOUNTER — Telehealth: Payer: Self-pay | Admitting: Internal Medicine

## 2014-03-21 NOTE — Telephone Encounter (Signed)
See previous phone note.  

## 2014-03-21 NOTE — Telephone Encounter (Signed)
Patient notified of recommendations. 

## 2014-03-21 NOTE — Telephone Encounter (Signed)
Left a message for patient to call back. 

## 2014-03-21 NOTE — Telephone Encounter (Signed)
Patient did her Humira injection last night. She states she held it down for 10 seconds then lifted the pen. She had a pool of medication about the size of a nickel on her leg. She states she did feel the medication going in but was concerned that she had the large amount of medication on her leg. She is calling to see if she needs to do anything different because of this. Please, advise.

## 2014-03-21 NOTE — Telephone Encounter (Signed)
Left message for patient to call back again.

## 2014-03-21 NOTE — Telephone Encounter (Signed)
Repeat next 40 mg dose in 7 days and then go every 14 days from there

## 2014-04-06 ENCOUNTER — Other Ambulatory Visit: Payer: Self-pay | Admitting: Internal Medicine

## 2014-04-06 ENCOUNTER — Other Ambulatory Visit: Payer: Self-pay | Admitting: Family Medicine

## 2014-06-29 ENCOUNTER — Other Ambulatory Visit: Payer: Self-pay | Admitting: Family Medicine

## 2014-07-14 ENCOUNTER — Telehealth: Payer: Self-pay | Admitting: *Deleted

## 2014-07-14 ENCOUNTER — Other Ambulatory Visit (HOSPITAL_BASED_OUTPATIENT_CLINIC_OR_DEPARTMENT_OTHER): Payer: BC Managed Care – PPO

## 2014-07-14 ENCOUNTER — Encounter: Payer: Self-pay | Admitting: Family

## 2014-07-14 ENCOUNTER — Ambulatory Visit (HOSPITAL_BASED_OUTPATIENT_CLINIC_OR_DEPARTMENT_OTHER): Payer: BC Managed Care – PPO | Admitting: Family

## 2014-07-14 VITALS — BP 125/68 | HR 84 | Temp 98.0°F | Resp 16 | Wt 142.0 lb

## 2014-07-14 DIAGNOSIS — D509 Iron deficiency anemia, unspecified: Secondary | ICD-10-CM | POA: Diagnosis not present

## 2014-07-14 DIAGNOSIS — K5090 Crohn's disease, unspecified, without complications: Secondary | ICD-10-CM | POA: Diagnosis not present

## 2014-07-14 DIAGNOSIS — D508 Other iron deficiency anemias: Secondary | ICD-10-CM

## 2014-07-14 LAB — IRON AND TIBC CHCC
%SAT: 26 % (ref 21–57)
Iron: 86 ug/dL (ref 41–142)
TIBC: 331 ug/dL (ref 236–444)
UIBC: 246 ug/dL (ref 120–384)

## 2014-07-14 LAB — CHCC SATELLITE - SMEAR

## 2014-07-14 LAB — RETICULOCYTES (CHCC)
ABS RETIC: 23.4 10*3/uL (ref 19.0–186.0)
RBC.: 4.67 MIL/uL (ref 3.87–5.11)
RETIC CT PCT: 0.5 % (ref 0.4–2.3)

## 2014-07-14 LAB — CBC WITH DIFFERENTIAL (CANCER CENTER ONLY)
BASO#: 0 10*3/uL (ref 0.0–0.2)
BASO%: 0.3 % (ref 0.0–2.0)
EOS%: 3.5 % (ref 0.0–7.0)
Eosinophils Absolute: 0.3 10*3/uL (ref 0.0–0.5)
HCT: 40.3 % (ref 34.8–46.6)
HGB: 13.4 g/dL (ref 11.6–15.9)
LYMPH#: 2.5 10*3/uL (ref 0.9–3.3)
LYMPH%: 33.3 % (ref 14.0–48.0)
MCH: 29.5 pg (ref 26.0–34.0)
MCHC: 33.3 g/dL (ref 32.0–36.0)
MCV: 89 fL (ref 81–101)
MONO#: 0.6 10*3/uL (ref 0.1–0.9)
MONO%: 7.9 % (ref 0.0–13.0)
NEUT#: 4.2 10*3/uL (ref 1.5–6.5)
NEUT%: 55 % (ref 39.6–80.0)
PLATELETS: 254 10*3/uL (ref 145–400)
RBC: 4.55 10*6/uL (ref 3.70–5.32)
RDW: 12.6 % (ref 11.1–15.7)
WBC: 7.6 10*3/uL (ref 3.9–10.0)

## 2014-07-14 LAB — FERRITIN CHCC: Ferritin: 57 ng/ml (ref 9–269)

## 2014-07-14 NOTE — Progress Notes (Signed)
Hematology and Oncology Follow Up Visit  Carla Clark [id number] July 26, 1985 28 y.o. 07/14/2014   Principle Diagnosis:  Iron deficiency anemia  Current Therapy:   IV iron as indicated    Interim History:  Carla Clark is here today for a follow-up. She is doing quite well and has ot experienced a Crohn's flare since starting [name] last year.  She had one dose of Feraheme in January 2015 and responded nicely. Her blood count today are good. She has had no problem with infections. No fever, chills, n/v, cough, rash, dizziness, SOB, chest pain, palpitations, abdominal pain, constipation, diarrhea, blood in urine or stool. Her cycles have been normal and not heavy.  No swelling, tenderness, numbness or tingling in her extremities.  Her appetite is good. She knows what foods to avoid. She is staying hydrated. Her weight is stable.  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 She is looking forward to being off and getting some rest.    Medications:    Medication List       This list is accurate as of: 07/14/14  9:38 AM.  Always use your most recent med list.               diphenhydrAMINE 25 mg capsule  Commonly known as:  BENADRYL  Take 25 mg by mouth at bedtime as needed.     HUMIRA PEN 40 MG/0.8ML Pnkt  Generic drug:  Adalimumab  Inject 1 pen into the skin every 14 (fourteen) days.     Misc Intestinal Flora Regulat Chew  Chew by mouth every other day. Digestive Health probiotic gummies by Schiff     VIORELE 0.15-0.02/0.01 MG (21/5) tablet  Generic drug:  desogestrel-ethinyl estradiol  TAKE 1 TABLET BY MOUTH DAILY.     ZYRTEC ALLERGY PO  Take by mouth 2 (two) times daily.        Allergies: No Known Allergies  Past Medical History, Surgical history, Social history, and Family History were reviewed and updated.  Review of Systems: All other 10 point review of systems is negative.   Physical Exam:  vitals were not taken for this  visit.  Wt Readings from Last 3 Encounters:  03/03/14 140 lb 3.2 oz (63.594 kg)  01/20/14 144 lb 9.6 oz (65.59 kg)  01/13/14 143 lb (64.864 kg)    Ocular: Sclerae unicteric, pupils equal, round and reactive to light Ear-nose-throat: Oropharynx clear, dentition fair Lymphatic: No cervical or supraclavicular adenopathy Lungs no rales or rhonchi, good excursion bilaterally Heart regular rate and rhythm, no murmur appreciated Abd soft, nontender, positive bowel sounds MSK no focal spinal tenderness, no joint edema Neuro: non-focal, well-oriented, appropriate affect Breasts: Deferred  Lab Results  Component Value Date   WBC 7.6 07/14/2014   HGB 13.4 07/14/2014   HCT 40.3 07/14/2014   MCV 89 07/14/2014   PLT 254 07/14/2014   Lab Results  Component Value Date   FERRITIN 60 01/13/2014   IRON 83 01/13/2014   TIBC 309 01/13/2014   UIBC 226 01/13/2014   IRONPCTSAT 27 01/13/2014   Lab Results  Component Value Date   RETICCTPCT 0.8 01/13/2014   RBC 4.55 07/14/2014   RETICCTABS 36.4 01/13/2014   No results found for: KPAFRELGTCHN, LAMBDASER, KAPLAMBRATIO No results found for: IGGSERUM, IGA, IGMSERUM No results found for: TOTALPROTELP, ALBUMINELP, A1GS, A2GS, BETS, BETA2SER, GAMS, MSPIKE, SPEI   Chemistry      Component Value Date/Time   NA 141 08/20/2012 1139  K 4.1 08/20/2012 1139   CL 106 08/20/2012 1139   CO2 27 08/20/2012 1139   BUN 8 08/20/2012 1139   CREATININE 0.62 08/20/2012 1139   CREATININE 0.7 11/29/2011 1430      Component Value Date/Time   CALCIUM 9.3 08/20/2012 1139   ALKPHOS 51 08/20/2012 1139   AST 11 08/20/2012 1139   ALT 11 08/20/2012 1139   BILITOT 0.2* 08/20/2012 1139     Impression and Plan: Carla Clark is a 29 year old white female with iron deficiency anemia secondary to Crohn's disease . She is doing much better since starting [name] and only received one dose of Feraheme in January 2015.  Her CBC today is good. She is asymptomatic at this  time.  We will plan to see her back in 6 months for labs and follow-up.  She knows to call with any questions or concerns. We can certainly see her sooner if need be.   [name], NP 6/20/20169:38 AM

## 2014-07-14 NOTE — Telephone Encounter (Addendum)
Patient aware of results   ----- Message from Volanda Napoleon, MD sent at 07/14/2014  3:19 PM EDT ----- Call - iron level is ok!!!  pete

## 2014-08-20 ENCOUNTER — Telehealth: Payer: Self-pay | Admitting: Internal Medicine

## 2014-08-20 NOTE — Telephone Encounter (Signed)
Patient is advised that she is due for tb gold test and also needs to have labwork which was supposed to be completed at her last office visit. Patient verbalizes understanding of this and will come this week or next for labwork. Orders already in EPIC.

## 2014-08-25 ENCOUNTER — Other Ambulatory Visit (INDEPENDENT_AMBULATORY_CARE_PROVIDER_SITE_OTHER): Payer: BC Managed Care – PPO

## 2014-08-25 DIAGNOSIS — K50019 Crohn's disease of small intestine with unspecified complications: Secondary | ICD-10-CM

## 2014-08-25 LAB — COMPREHENSIVE METABOLIC PANEL
ALBUMIN: 4.2 g/dL (ref 3.5–5.2)
ALT: 22 U/L (ref 0–35)
AST: 15 U/L (ref 0–37)
Alkaline Phosphatase: 45 U/L (ref 39–117)
BILIRUBIN TOTAL: 0.4 mg/dL (ref 0.2–1.2)
BUN: 10 mg/dL (ref 6–23)
CALCIUM: 9.9 mg/dL (ref 8.4–10.5)
CO2: 26 mEq/L (ref 19–32)
Chloride: 107 mEq/L (ref 96–112)
Creatinine, Ser: 0.74 mg/dL (ref 0.40–1.20)
GFR: 100.07 mL/min (ref >60.00)
GLUCOSE: 87 mg/dL (ref 70–99)
Potassium: 4.6 mEq/L (ref 3.5–5.1)
Sodium: 140 mEq/L (ref 135–145)
Total Protein: 7.5 g/dL (ref 6.0–8.3)

## 2014-08-25 LAB — VITAMIN D 25 HYDROXY (VIT D DEFICIENCY, FRACTURES): VITD: 28.84 ng/mL — AB (ref 30.00–100.00)

## 2014-08-25 LAB — HIGH SENSITIVITY CRP: CRP HIGH SENSITIVITY: 1.18 mg/L (ref 0.000–5.000)

## 2014-08-27 LAB — QUANTIFERON TB GOLD ASSAY (BLOOD)
INTERFERON GAMMA RELEASE ASSAY: NEGATIVE
Mitogen value: >10 IU/mL
QUANTIFERON TB AG MINUS NIL: 0 [IU]/mL
Quantiferon Nil Value: 0.12 IU/mL
TB AG VALUE: 0.08 [IU]/mL

## 2014-09-21 ENCOUNTER — Other Ambulatory Visit: Payer: Self-pay | Admitting: Family Medicine

## 2014-09-25 DIAGNOSIS — L509 Urticaria, unspecified: Secondary | ICD-10-CM

## 2014-09-25 HISTORY — DX: Urticaria, unspecified: L50.9

## 2014-10-13 ENCOUNTER — Telehealth: Payer: Self-pay | Admitting: Internal Medicine

## 2014-10-13 NOTE — Telephone Encounter (Signed)
Left message for pt to call back  °

## 2014-10-14 NOTE — Telephone Encounter (Signed)
Pt states her insurance is changing and it will effect her humira. Instructed pt to bring a copy of her new card to the office and we can process through with [facility].

## 2014-10-15 NOTE — Telephone Encounter (Signed)
New paperwork/Insurance information faxed to [facility] for Humira.

## 2014-10-17 ENCOUNTER — Telehealth: Payer: Self-pay | Admitting: Internal Medicine

## 2014-10-17 NOTE — Telephone Encounter (Signed)
[employer] has approved patient's Humira until 10/16/15. [id number].

## 2014-10-20 NOTE — Telephone Encounter (Signed)
Discussed with [facility] that we are waiting to hear back from [facility], pts insurance has changed. [facility] has cancelled order for now.

## 2014-10-23 ENCOUNTER — Telehealth: Payer: Self-pay | Admitting: Internal Medicine

## 2014-10-23 NOTE — Telephone Encounter (Signed)
Carla Clark- isnt this the same person that you sent to [facility]?

## 2014-10-23 NOTE — Telephone Encounter (Signed)
Spoke with pt and she will receive a call from [facility] that is changing its name to [place] tomorrow and her Humira should be shipped out Tuesday.Spoke with pt and she is aware.

## 2014-10-27 ENCOUNTER — Telehealth: Payer: Self-pay | Admitting: Internal Medicine

## 2014-10-27 NOTE — Telephone Encounter (Signed)
ICD 10 code of K50.019 Crohns ileitis given to [place].

## 2014-12-17 ENCOUNTER — Other Ambulatory Visit: Payer: Self-pay | Admitting: Family Medicine

## 2015-01-12 ENCOUNTER — Encounter: Payer: Self-pay | Admitting: Family Medicine

## 2015-01-20 ENCOUNTER — Ambulatory Visit: Payer: BC Managed Care – PPO

## 2015-01-20 ENCOUNTER — Other Ambulatory Visit (HOSPITAL_BASED_OUTPATIENT_CLINIC_OR_DEPARTMENT_OTHER): Payer: Commercial Managed Care - PPO

## 2015-01-20 ENCOUNTER — Ambulatory Visit (HOSPITAL_BASED_OUTPATIENT_CLINIC_OR_DEPARTMENT_OTHER): Payer: Commercial Managed Care - PPO | Admitting: Hematology & Oncology

## 2015-01-20 ENCOUNTER — Encounter: Payer: Self-pay | Admitting: Hematology & Oncology

## 2015-01-20 VITALS — BP 123/78 | HR 92 | Temp 98.0°F | Resp 18 | Ht 63.0 in | Wt 137.0 lb

## 2015-01-20 DIAGNOSIS — D508 Other iron deficiency anemias: Secondary | ICD-10-CM

## 2015-01-20 LAB — RETICULOCYTES
ABS Retic: 13.7 10*3/uL — ABNORMAL LOW (ref 19.0–186.0)
RBC.: 4.56 MIL/uL (ref 3.87–5.11)
RETIC CT PCT: 0.3 % — AB (ref 0.4–2.3)

## 2015-01-20 LAB — CBC WITH DIFFERENTIAL (CANCER CENTER ONLY)
BASO#: 0 10*3/uL (ref 0.0–0.2)
BASO%: 0.5 % (ref 0.0–2.0)
EOS%: 3.4 % (ref 0.0–7.0)
Eosinophils Absolute: 0.3 10*3/uL (ref 0.0–0.5)
HEMATOCRIT: 40.9 % (ref 34.8–46.6)
HEMOGLOBIN: 13.1 g/dL (ref 11.6–15.9)
LYMPH#: 2.8 10*3/uL (ref 0.9–3.3)
LYMPH%: 37.6 % (ref 14.0–48.0)
MCH: 28.1 pg (ref 26.0–34.0)
MCHC: 32 g/dL (ref 32.0–36.0)
MCV: 88 fL (ref 81–101)
MONO#: 0.6 10*3/uL (ref 0.1–0.9)
MONO%: 8.3 % (ref 0.0–13.0)
NEUT#: 3.7 10*3/uL (ref 1.5–6.5)
NEUT%: 50.2 % (ref 39.6–80.0)
Platelets: 215 10*3/uL (ref 145–400)
RBC: 4.66 10*6/uL (ref 3.70–5.32)
RDW: 12.8 % (ref 11.1–15.7)
WBC: 7.5 10*3/uL (ref 3.9–10.0)

## 2015-01-20 LAB — FERRITIN: FERRITIN: 40 ng/mL (ref 9–269)

## 2015-01-20 LAB — IRON AND TIBC
%SAT: 22 % (ref 21–57)
IRON: 74 ug/dL (ref 41–142)
TIBC: 333 ug/dL (ref 236–444)
UIBC: 258 ug/dL (ref 120–384)

## 2015-01-20 LAB — CHCC SATELLITE - SMEAR

## 2015-01-20 NOTE — Progress Notes (Signed)
Hematology and Oncology Follow Up Visit  Carla Clark [id number] [birth year] 29 y.o. 01/20/2015   Principle Diagnosis:   Iron deficiency anemia  Crohn's disease  Current Therapy:    IV iron as indicated     Interim History:  Carla Clark is back for followup. She was seen last about 3 months ago. She's done very well with IV iron. I gave her iron back in January of 2015.  When we last saw her back in June, her ferritin was 57.   She's had no bleeding. Her monthly cycles are okay. She's had no issues with Crohn's disease exacerbations. 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 She is now taking at [facility] had no change in her diet. She had no cough or shortness of breath.  She had a good Thanksgiving. She is off from school right now.  Her performance status is ECOG 0.  Medications:  Current outpatient prescriptions:  .  Cetirizine HCl (ZYRTEC ALLERGY PO), Take by mouth 2 (two) times daily., Disp: , Rfl:  .  diphenhydrAMINE (BENADRYL) 25 mg capsule, Take 25 mg by mouth at bedtime as needed. , Disp: , Rfl:  .  HUMIRA PEN 40 MG/0.8ML PNKT, Inject 1 pen into the skin every 14 (fourteen) days., Disp: 2 each, Rfl: 10 .  Probiotic Product (MISC INTESTINAL FLORA REGULAT) CHEW, Chew by mouth every other day. Digestive Health probiotic gummies by Schiff, Disp: , Rfl:  .  VIORELE 0.15-0.02/0.01 MG (21/5) tablet, TAKE 1 TABLET BY MOUTH DAILY., Disp: 84 tablet, Rfl: 0  Allergies: No Known Allergies  Past Medical History, Surgical history, Social history, and Family History were reviewed and updated.  Review of Systems: As above  Physical Exam:  height is 5' 3"  (1.6 m) and weight is 137 lb (62.143 kg). Her oral temperature is 98 F (36.7 C). Her blood pressure is 123/78 and her pulse is 92. Her respiration is 18.   Well-developed well-nourished white female. Head and neck exam shows no ocular or oral lesions. She has no scleral icterus. There is no  mucositis. Neck is supple with no lymphadenopathy. Lungs are clear. Cardiac exam regular rate and rhythm with no murmurs rubs or bruits. Abdomen is soft. Has good bowel sounds. There is no fluid wave. There is no palpable liver or spleen to. Extremities shows no clubbing cyanosis or edema. Neurological exam shows no focal neurological deficits. Skin exam no rashes ecchymoses or petechia. Lab Results  Component Value Date   WBC 7.5 01/20/2015   HGB 13.1 01/20/2015   HCT 40.9 01/20/2015   MCV 88 01/20/2015   PLT 215 01/20/2015     Chemistry      Component Value Date/Time   NA 140 08/25/2014 1606   K 4.6 08/25/2014 1606   CL 107 08/25/2014 1606   CO2 26 08/25/2014 1606   BUN 10 08/25/2014 1606   CREATININE 0.74 08/25/2014 1606   CREATININE 0.62 08/20/2012 1139      Component Value Date/Time   CALCIUM 9.9 08/25/2014 1606   ALKPHOS 45 08/25/2014 1606   AST 15 08/25/2014 1606   ALT 22 08/25/2014 1606   BILITOT 0.4 08/25/2014 1606         Impression and Plan: Carla Clark is a 29 year old white female. She has an iron deficiency anemia. This is from Crohn's disease . She also has some loss of from her monthly cycles.  I do not think that she needs any iron today. Her  MCV is holding pretty steady.  I'll plan to get her back in about 4 months. With her teaching, I want to make sure that we try to coordinate appointments with her time off from school.   Volanda Napoleon, MD 12/27/201611:03 AM

## 2015-01-21 ENCOUNTER — Encounter: Payer: Self-pay | Admitting: *Deleted

## 2015-01-22 ENCOUNTER — Encounter: Payer: BC Managed Care – PPO | Admitting: Family Medicine

## 2015-02-06 ENCOUNTER — Other Ambulatory Visit: Payer: Self-pay | Admitting: Family Medicine

## 2015-02-06 MED ORDER — DESOGESTREL-ETHINYL ESTRADIOL 0.15-0.02/0.01 MG (21/5) PO TABS: 1.0000 | ORAL_TABLET | Freq: Every day | ORAL | Status: DC

## 2015-04-16 ENCOUNTER — Inpatient Hospital Stay (HOSPITAL_BASED_OUTPATIENT_CLINIC_OR_DEPARTMENT_OTHER)
Admission: EM | Admit: 2015-04-16 | Discharge: 2015-04-20 | DRG: 386 | Disposition: A | Discharge status: Home / Self Care | Payer: Commercial Managed Care - PPO | Attending: Internal Medicine | Admitting: Internal Medicine

## 2015-04-16 ENCOUNTER — Emergency Department (HOSPITAL_BASED_OUTPATIENT_CLINIC_OR_DEPARTMENT_OTHER): Payer: Commercial Managed Care - PPO

## 2015-04-16 ENCOUNTER — Encounter (HOSPITAL_BASED_OUTPATIENT_CLINIC_OR_DEPARTMENT_OTHER): Payer: Self-pay | Admitting: *Deleted

## 2015-04-16 DIAGNOSIS — K50012 Crohn's disease of small intestine with intestinal obstruction: Secondary | ICD-10-CM

## 2015-04-16 DIAGNOSIS — E559 Vitamin D deficiency, unspecified: Secondary | ICD-10-CM | POA: Diagnosis present

## 2015-04-16 DIAGNOSIS — Z793 Long term (current) use of hormonal contraceptives: Secondary | ICD-10-CM

## 2015-04-16 DIAGNOSIS — Z801 Family history of malignant neoplasm of trachea, bronchus and lung: Secondary | ICD-10-CM

## 2015-04-16 DIAGNOSIS — K56609 Unspecified intestinal obstruction, unspecified as to partial versus complete obstruction: Secondary | ICD-10-CM | POA: Insufficient documentation

## 2015-04-16 DIAGNOSIS — K50812 Crohn's disease of both small and large intestine with intestinal obstruction: Principal | ICD-10-CM | POA: Diagnosis present

## 2015-04-16 DIAGNOSIS — Z79899 Other long term (current) drug therapy: Secondary | ICD-10-CM

## 2015-04-16 DIAGNOSIS — D509 Iron deficiency anemia, unspecified: Secondary | ICD-10-CM | POA: Diagnosis present

## 2015-04-16 DIAGNOSIS — Z800 Family history of malignant neoplasm of digestive organs: Secondary | ICD-10-CM

## 2015-04-16 DIAGNOSIS — R1033 Periumbilical pain: Secondary | ICD-10-CM | POA: Diagnosis not present

## 2015-04-16 DIAGNOSIS — K5000 Crohn's disease of small intestine without complications: Secondary | ICD-10-CM | POA: Diagnosis present

## 2015-04-16 DIAGNOSIS — K219 Gastro-esophageal reflux disease without esophagitis: Secondary | ICD-10-CM | POA: Diagnosis present

## 2015-04-16 DIAGNOSIS — K5010 Crohn's disease of large intestine without complications: Secondary | ICD-10-CM | POA: Diagnosis present

## 2015-04-16 DIAGNOSIS — Z8041 Family history of malignant neoplasm of ovary: Secondary | ICD-10-CM

## 2015-04-16 HISTORY — DX: Deficiency of other specified B group vitamins: E53.8

## 2015-04-16 HISTORY — DX: Other specified health status: Z78.9

## 2015-04-16 HISTORY — DX: Urticaria, unspecified: L50.9

## 2015-04-16 LAB — CBC WITH DIFFERENTIAL/PLATELET
BASOS PCT: 0 %
Basophils Absolute: 0 10*3/uL (ref 0.0–0.1)
EOS ABS: 0 10*3/uL (ref 0.0–0.7)
Eosinophils Relative: 0 %
HCT: 45.2 % (ref 36.0–46.0)
HEMOGLOBIN: 15.1 g/dL — AB (ref 12.0–15.0)
Lymphocytes Relative: 8 %
Lymphs Abs: 1.3 10*3/uL (ref 0.7–4.0)
MCH: 28.9 pg (ref 26.0–34.0)
MCHC: 33.4 g/dL (ref 30.0–36.0)
MCV: 86.6 fL (ref 78.0–100.0)
MONOS PCT: 6 %
Monocytes Absolute: 0.9 10*3/uL (ref 0.1–1.0)
NEUTROS PCT: 86 %
Neutro Abs: 14.1 10*3/uL — ABNORMAL HIGH (ref 1.7–7.7)
Platelets: 304 10*3/uL (ref 150–400)
RBC: 5.22 MIL/uL — ABNORMAL HIGH (ref 3.87–5.11)
RDW: 13.3 % (ref 11.5–15.5)
WBC: 16.4 10*3/uL — AB (ref 4.0–10.5)

## 2015-04-16 LAB — URINALYSIS, ROUTINE W REFLEX MICROSCOPIC
BILIRUBIN URINE: NEGATIVE
GLUCOSE, UA: NEGATIVE mg/dL
KETONES UR: 40 mg/dL — AB
Leukocytes, UA: NEGATIVE
Nitrite: NEGATIVE
PROTEIN: NEGATIVE mg/dL
Specific Gravity, Urine: 1.021 (ref 1.005–1.030)
pH: 6 (ref 5.0–8.0)

## 2015-04-16 LAB — URINE MICROSCOPIC-ADD ON

## 2015-04-16 LAB — COMPREHENSIVE METABOLIC PANEL
ALBUMIN: 4.8 g/dL (ref 3.5–5.0)
ALK PHOS: 59 U/L (ref 38–126)
ALT: 23 U/L (ref 14–54)
ANION GAP: 11 (ref 5–15)
AST: 19 U/L (ref 15–41)
BUN: 9 mg/dL (ref 6–20)
CALCIUM: 9.9 mg/dL (ref 8.9–10.3)
CO2: 23 mmol/L (ref 22–32)
Chloride: 104 mmol/L (ref 101–111)
Creatinine, Ser: 0.73 mg/dL (ref 0.44–1.00)
GFR calc Af Amer: >60 mL/min (ref >60)
GFR calc non Af Amer: >60 mL/min (ref >60)
GLUCOSE: 84 mg/dL (ref 65–99)
POTASSIUM: 4.2 mmol/L (ref 3.5–5.1)
SODIUM: 138 mmol/L (ref 135–145)
Total Bilirubin: 0.9 mg/dL (ref 0.3–1.2)
Total Protein: 8.2 g/dL — ABNORMAL HIGH (ref 6.5–8.1)

## 2015-04-16 LAB — LIPASE, BLOOD: Lipase: 42 U/L (ref 11–51)

## 2015-04-16 LAB — PREGNANCY, URINE: PREG TEST UR: NEGATIVE

## 2015-04-16 MED ORDER — IOPAMIDOL (ISOVUE-300) INJECTION 61%
100.0000 mL | Freq: Once | INTRAVENOUS | Status: AC | PRN
  Administered 2015-04-16: 100 mL via INTRAVENOUS

## 2015-04-16 NOTE — ED Notes (Signed)
Pt drinking contrast  Waiting for ct

## 2015-04-16 NOTE — ED Provider Notes (Signed)
CSN: [id number]     Arrival date & time 04/16/15  1705 History   First MD Initiated Contact with Patient 04/16/15 1845     Chief Complaint  Patient presents with  . Abdominal Pain     (Consider location/radiation/quality/duration/timing/severity/associated sxs/prior Treatment) HPI Comments: Patient with history of crohn's. Is taking humira every 14 days, but missed her two doses last month. Most recent dose last week. Her RLQ discomfort is consistent with prior flares of crohn's.  Patient is a 30 y.o. female presenting with abdominal pain. The history is provided by the patient. No language interpreter was used.  Abdominal Pain Pain location:  Periumbilical, epigastric and RLQ Pain quality: gnawing and pressure   Pain severity:  Mild Onset quality:  Sudden Duration:  1 day Timing:  Sporadic Progression:  Waxing and waning Chronicity:  Recurrent Context comment:  Patient with history of Crohn's Associated symptoms: nausea and vomiting   Risk factors: not pregnant     Past Medical History  Diagnosis Date  . Chicken pox as a child  . Colitis 11/06/2011  . Preventative health care 11/06/2011  . Anemia 01/27/2012  . Contraceptive management 01/27/2012  . Breast mass in female 04/21/2012  . Crohn's disease ([place])   . Cervical cancer screening 01/27/2012  . Other specified iron deficiency anemias 01/21/2013  . Crohn's disease ([place]) 08/07/2012  . Vitamin D deficiency   . GERD (gastroesophageal reflux disease)    Past Surgical History  Procedure Laterality Date  . Wisdom tooth extraction  30 yrs old   Family History  Problem Relation Age of Onset  . Nephrolithiasis Father   . Lung cancer Maternal Grandmother   . Gout Maternal Grandfather   . Ovarian cancer Paternal Grandmother   . Cancer Paternal Grandmother 64    ovarian  . Pancreatic cancer Paternal Grandfather   . Cancer Paternal Aunt     ovarian   Social History  Substance Use Topics  . Smoking status: Never Smoker   .  Smokeless tobacco: Never Used     Comment: never used tobacco  . Alcohol Use: 0.0 oz/week    0 Standard drinks or equivalent per week     Comment: occasionaly   OB History    No data available     Review of Systems  Gastrointestinal: Positive for nausea, vomiting and abdominal pain.  All other systems reviewed and are negative.     Allergies  Review of patient's allergies indicates no known allergies.  Home Medications   Prior to Admission medications   Medication Sig Start Date End Date Taking? Authorizing Provider  Cetirizine HCl (ZYRTEC ALLERGY PO) Take by mouth 2 (two) times daily.   Yes Historical Provider, MD  desogestrel-ethinyl estradiol (VIORELE) 0.15-0.02/0.01 MG (21/5) tablet Take 1 tablet by mouth daily. 02/06/15  Yes Mosie Lukes, MD  diphenhydrAMINE (BENADRYL) 25 mg capsule Take 25 mg by mouth at bedtime as needed.    Yes Historical Provider, MD  HUMIRA PEN 40 MG/0.8ML PNKT Inject 1 pen into the skin every 14 (fourteen) days. 03/07/14  Yes Jerene Bears, MD  Probiotic Product (MISC INTESTINAL FLORA REGULAT) [place] by mouth every other day. Digestive Health probiotic gummies by Schiff 11/04/11  Yes Mosie Lukes, MD   BP 125/84 mmHg  Pulse 100  Temp(Src) 98.7 F (37.1 C) (Oral)  Resp 18  Ht 5' 3.5" (1.613 m)  Wt 61.236 kg  BMI 23.54 kg/m2  SpO2 100%  LMP 03/25/2015 (Approximate) Physical Exam  Constitutional:  She is oriented to person, place, and time. She appears well-developed and well-nourished.  HENT:  Head: Normocephalic.  Eyes: Pupils are equal, round, and reactive to light.  Neck: Neck supple.  Cardiovascular: Normal rate and regular rhythm.   Pulmonary/Chest: Effort normal and breath sounds normal.  Abdominal: Soft. Bowel sounds are normal. There is tenderness.    Musculoskeletal: She exhibits no edema or tenderness.  Lymphadenopathy:    She has no cervical adenopathy.  Neurological: She is alert and oriented to person, place, and time.    Skin: Skin is warm and dry.  Psychiatric: She has a normal mood and affect.  Nursing note and vitals reviewed.   ED Course  Procedures (including critical care time) Labs Review Labs Reviewed  URINALYSIS, ROUTINE W REFLEX MICROSCOPIC (NOT AT [facility]) - Abnormal; Notable for the following:    Hgb urine dipstick TRACE (*)    Ketones, ur 40 (*)    All other components within normal limits  URINE MICROSCOPIC-ADD ON - Abnormal; Notable for the following:    Squamous Epithelial / LPF 0-5 (*)    Bacteria, UA RARE (*)    Casts GRANULAR CAST (*)    All other components within normal limits  CBC WITH DIFFERENTIAL/PLATELET - Abnormal; Notable for the following:    WBC 16.4 (*)    RBC 5.22 (*)    Hemoglobin 15.1 (*)    Neutro Abs 14.1 (*)    All other components within normal limits  COMPREHENSIVE METABOLIC PANEL - Abnormal; Notable for the following:    Total Protein 8.2 (*)    All other components within normal limits  PREGNANCY, URINE  LIPASE, BLOOD    Imaging Review Ct Abdomen Pelvis W Contrast  04/16/2015  CLINICAL DATA:  Supraumbilical pain with nausea and vomiting for 16 hours. History of Crohn disease. EXAM: CT ABDOMEN AND PELVIS WITH CONTRAST TECHNIQUE: Multidetector CT imaging of the abdomen and pelvis was performed using the standard protocol following bolus administration of intravenous contrast. CONTRAST:  125m ISOVUE-300 IOPAMIDOL (ISOVUE-300) INJECTION 61% COMPARISON:  09/05/2012 FINDINGS: Lung bases are clear. The liver, spleen, pancreas, adrenal glands, kidneys, abdominal aorta, inferior vena cava, and retroperitoneal lymph nodes are unremarkable. Stomach appears normal. Small bowel are dilated and fluid-filled. Decompression of distal small bowel. Transition zone appears to be in the right lower quadrant. The wall of the terminal ileum is diffusely thickened and there is infiltration in the mesenteric fat of the right lower quadrant. Obstruction is likely due to inflammatory  strictures. Infiltration in the right lower quadrant fat may indicate inflammation or fistula. No discrete abscess is identified. Colon is mostly decompressed. No free air or free fluid in the abdomen. Pelvis: The appendix is normal. There is free fluid in the pelvis, likely reactive. Involuting cyst in the right ovary. Uterus and ovaries are not enlarged. Bladder wall is not thickened. No destructive bone lesions. IMPRESSION: Wall thickening in the terminal ileum consistent with inflammatory changes due to known Crohn's disease. There is proximal small bowel obstruction, likely due to inflammatory strictures. Infiltration in the right lower quadrant fat may indicate fistula or inflammatory reaction. Small amount of free fluid in the pelvis is likely reactive. No discrete abscess identified. Electronically Signed   By: WLucienne CapersM.D.   On: 04/16/2015 22:46   I have personally reviewed and evaluated these images and lab results as part of my medical decision-making.   EKG Interpretation None     Radiology and lab results reviewed and shared with  patient/family. Consult with general surgery Redmond Pulling). Recommends medical management at present in conjunction with gastroenterology. Will see in consult if needed. Consulted with [facility] Ardis Hughs). Admit to hospitalist service, and they will see patient in the morning. Consult with triad, admitted to med-surg at [facility].  Temporary admission orders completed.  Cipro, flagyl, and solu-medrol initiated in ED. MDM   Final diagnoses:  None   Crohn exacerbation with SBO. Admitted.      Etta Quill, NP 04/17/15 [place], MD 04/20/15 [phone]

## 2015-04-16 NOTE — ED Notes (Signed)
Mid abd pain since waking at 0630. Sharp pain. Vomited x 1 at 1530

## 2015-04-16 NOTE — ED Notes (Signed)
Pt in CT.

## 2015-04-17 ENCOUNTER — Encounter (HOSPITAL_COMMUNITY): Payer: Self-pay | Admitting: Internal Medicine

## 2015-04-17 DIAGNOSIS — D509 Iron deficiency anemia, unspecified: Secondary | ICD-10-CM | POA: Diagnosis present

## 2015-04-17 DIAGNOSIS — K50012 Crohn's disease of small intestine with intestinal obstruction: Secondary | ICD-10-CM | POA: Diagnosis not present

## 2015-04-17 DIAGNOSIS — K219 Gastro-esophageal reflux disease without esophagitis: Secondary | ICD-10-CM | POA: Diagnosis present

## 2015-04-17 DIAGNOSIS — K50119 Crohn's disease of large intestine with unspecified complications: Secondary | ICD-10-CM | POA: Diagnosis not present

## 2015-04-17 DIAGNOSIS — R1033 Periumbilical pain: Secondary | ICD-10-CM | POA: Diagnosis present

## 2015-04-17 DIAGNOSIS — Z79899 Other long term (current) drug therapy: Secondary | ICD-10-CM | POA: Diagnosis not present

## 2015-04-17 DIAGNOSIS — K50112 Crohn's disease of large intestine with intestinal obstruction: Secondary | ICD-10-CM | POA: Diagnosis not present

## 2015-04-17 DIAGNOSIS — K5669 Other intestinal obstruction: Secondary | ICD-10-CM

## 2015-04-17 DIAGNOSIS — Z8041 Family history of malignant neoplasm of ovary: Secondary | ICD-10-CM | POA: Diagnosis not present

## 2015-04-17 DIAGNOSIS — K50812 Crohn's disease of both small and large intestine with intestinal obstruction: Secondary | ICD-10-CM | POA: Diagnosis present

## 2015-04-17 DIAGNOSIS — Z800 Family history of malignant neoplasm of digestive organs: Secondary | ICD-10-CM | POA: Diagnosis not present

## 2015-04-17 DIAGNOSIS — Z801 Family history of malignant neoplasm of trachea, bronchus and lung: Secondary | ICD-10-CM | POA: Diagnosis not present

## 2015-04-17 DIAGNOSIS — K5000 Crohn's disease of small intestine without complications: Secondary | ICD-10-CM | POA: Diagnosis present

## 2015-04-17 DIAGNOSIS — E559 Vitamin D deficiency, unspecified: Secondary | ICD-10-CM | POA: Diagnosis present

## 2015-04-17 DIAGNOSIS — K5010 Crohn's disease of large intestine without complications: Secondary | ICD-10-CM | POA: Diagnosis present

## 2015-04-17 DIAGNOSIS — Z793 Long term (current) use of hormonal contraceptives: Secondary | ICD-10-CM | POA: Diagnosis not present

## 2015-04-17 LAB — CBC WITH DIFFERENTIAL/PLATELET
Basophils Absolute: 0 10*3/uL (ref 0.0–0.1)
Basophils Relative: 0 %
EOS ABS: 0 10*3/uL (ref 0.0–0.7)
EOS PCT: 0 %
HCT: 38.1 % (ref 36.0–46.0)
Hemoglobin: 13 g/dL (ref 12.0–15.0)
LYMPHS ABS: 0.7 10*3/uL (ref 0.7–4.0)
LYMPHS PCT: 6 %
MCH: 28.8 pg (ref 26.0–34.0)
MCHC: 34.1 g/dL (ref 30.0–36.0)
MCV: 84.3 fL (ref 78.0–100.0)
Monocytes Absolute: 0.1 10*3/uL (ref 0.1–1.0)
Monocytes Relative: 1 %
NEUTROS PCT: 93 %
Neutro Abs: 10.3 10*3/uL — ABNORMAL HIGH (ref 1.7–7.7)
PLATELETS: 302 10*3/uL (ref 150–400)
RBC: 4.52 MIL/uL (ref 3.87–5.11)
RDW: 12.9 % (ref 11.5–15.5)
WBC: 11.1 10*3/uL — AB (ref 4.0–10.5)

## 2015-04-17 LAB — COMPREHENSIVE METABOLIC PANEL
ALT: 20 U/L (ref 14–54)
AST: 16 U/L (ref 15–41)
Albumin: 3.8 g/dL (ref 3.5–5.0)
Alkaline Phosphatase: 43 U/L (ref 38–126)
Anion gap: 9 (ref 5–15)
BILIRUBIN TOTAL: 1.1 mg/dL (ref 0.3–1.2)
BUN: 11 mg/dL (ref 6–20)
CHLORIDE: 109 mmol/L (ref 101–111)
CO2: 19 mmol/L — ABNORMAL LOW (ref 22–32)
CREATININE: 0.69 mg/dL (ref 0.44–1.00)
Calcium: 9.1 mg/dL (ref 8.9–10.3)
Glucose, Bld: 114 mg/dL — ABNORMAL HIGH (ref 65–99)
POTASSIUM: 4.3 mmol/L (ref 3.5–5.1)
Sodium: 137 mmol/L (ref 135–145)
TOTAL PROTEIN: 7 g/dL (ref 6.5–8.1)

## 2015-04-17 LAB — LACTIC ACID, PLASMA: LACTIC ACID, VENOUS: 0.8 mmol/L (ref 0.5–2.0)

## 2015-04-17 MED ORDER — METRONIDAZOLE IN NACL 5-0.79 MG/ML-% IV SOLN
500.0000 mg | Freq: Once | INTRAVENOUS | Status: AC
  Administered 2015-04-17: 500 mg via INTRAVENOUS
  Filled 2015-04-17: qty 100

## 2015-04-17 MED ORDER — FENTANYL CITRATE (PF) 100 MCG/2ML IJ SOLN: 50.0000 ug | INTRAMUSCULAR | Status: DC | PRN

## 2015-04-17 MED ORDER — PIPERACILLIN-TAZOBACTAM 3.375 G IVPB
3.3750 g | Freq: Three times a day (TID) | INTRAVENOUS | Status: DC
  Filled 2015-04-17: qty 50

## 2015-04-17 MED ORDER — ACETAMINOPHEN 325 MG PO TABS
650.0000 mg | ORAL_TABLET | Freq: Four times a day (QID) | ORAL | Status: DC | PRN
  Administered 2015-04-17: 650 mg via ORAL
  Filled 2015-04-17: qty 2

## 2015-04-17 MED ORDER — ONDANSETRON HCL 4 MG/2ML IJ SOLN: 4.0000 mg | Freq: Four times a day (QID) | INTRAMUSCULAR | Status: DC | PRN

## 2015-04-17 MED ORDER — KCL IN DEXTROSE-NACL 10-5-0.45 MEQ/L-%-% IV SOLN
INTRAVENOUS | Status: DC
  Administered 2015-04-17 – 2015-04-18 (×3): via INTRAVENOUS
  Filled 2015-04-17 (×10): qty 1000

## 2015-04-17 MED ORDER — SODIUM CHLORIDE 0.9 % IV SOLN
INTRAVENOUS | Status: DC
  Administered 2015-04-17: 02:00:00 via INTRAVENOUS

## 2015-04-17 MED ORDER — ONDANSETRON HCL 4 MG PO TABS: 4.0000 mg | ORAL_TABLET | Freq: Four times a day (QID) | ORAL | Status: DC | PRN

## 2015-04-17 MED ORDER — CHLORHEXIDINE GLUCONATE 0.12 % MT SOLN
15.0000 mL | Freq: Two times a day (BID) | OROMUCOSAL | Status: DC
  Filled 2015-04-17 (×2): qty 15

## 2015-04-17 MED ORDER — METHYLPREDNISOLONE SODIUM SUCC 40 MG IJ SOLR
40.0000 mg | Freq: Every day | INTRAMUSCULAR | Status: DC
  Administered 2015-04-17 – 2015-04-19 (×3): 40 mg via INTRAVENOUS
  Filled 2015-04-17 (×5): qty 1

## 2015-04-17 MED ORDER — CIPROFLOXACIN IN D5W 400 MG/200ML IV SOLN
400.0000 mg | Freq: Once | INTRAVENOUS | Status: AC
  Administered 2015-04-17: 400 mg via INTRAVENOUS
  Filled 2015-04-17: qty 200

## 2015-04-17 MED ORDER — SODIUM CHLORIDE 0.9 % IV SOLN: INTRAVENOUS | Status: AC

## 2015-04-17 MED ORDER — FENTANYL CITRATE (PF) 100 MCG/2ML IJ SOLN
50.0000 ug | Freq: Once | INTRAMUSCULAR | Status: AC
  Administered 2015-04-17: 50 ug via INTRAVENOUS
  Filled 2015-04-17: qty 2

## 2015-04-17 MED ORDER — FAMOTIDINE IN NACL 20-0.9 MG/50ML-% IV SOLN
20.0000 mg | Freq: Two times a day (BID) | INTRAVENOUS | Status: DC
  Administered 2015-04-17 – 2015-04-19 (×6): 20 mg via INTRAVENOUS
  Filled 2015-04-17 (×9): qty 50

## 2015-04-17 MED ORDER — SODIUM CHLORIDE 0.9 % IV SOLN: INTRAVENOUS | Status: DC

## 2015-04-17 MED ORDER — PIPERACILLIN-TAZOBACTAM 3.375 G IVPB 30 MIN
3.3750 g | Freq: Once | INTRAVENOUS | Status: AC
  Administered 2015-04-17: 3.375 g via INTRAVENOUS
  Filled 2015-04-17: qty 50

## 2015-04-17 MED ORDER — METHYLPREDNISOLONE SODIUM SUCC 125 MG IJ SOLR
125.0000 mg | Freq: Once | INTRAMUSCULAR | Status: AC
  Administered 2015-04-17: 125 mg via INTRAVENOUS
  Filled 2015-04-17: qty 2

## 2015-04-17 MED ORDER — ACETAMINOPHEN 650 MG RE SUPP: 650.0000 mg | Freq: Four times a day (QID) | RECTAL | Status: DC | PRN

## 2015-04-17 MED ORDER — CETYLPYRIDINIUM CHLORIDE 0.05 % MT LIQD: 7.0000 mL | Freq: Two times a day (BID) | OROMUCOSAL | Status: DC

## 2015-04-17 NOTE — Progress Notes (Signed)
Pharmacy Antibiotic Note  Carla Clark is a 30 y.o. female with PMH anemia and Crohn's disease on Humira admitted on 04/16/2015 with acute exacerbation of Crohn's colitis with possible obstruction.  Started on Cipro/Flagyl; however, pharmacy has been consulted for Zosyn dosing.  Plan:  Zosyn 3.375 g IV given once over 30 minutes, then every 8 hrs by 4-hr infusion  Given intact renal function and lack of cultures, will sign off as further adjustment or narrowing per cultures is unlikely.  Will revisit if LOT exceeds typical recommended for this indication.   Height: 5' 3.5" (161.3 cm) Weight: 131 lb 1.6 oz (59.467 kg) IBW/kg (Calculated) : 53.55  Temp (24hrs), Avg:98.5 F (36.9 C), Min:98.1 F (36.7 C), Max:98.8 F (37.1 C)   Recent Labs Lab 04/16/15 1910 04/17/15 0618  WBC 16.4* 11.1*  CREATININE 0.73 0.69  LATICACIDVEN  --  0.8    Estimated Creatinine Clearance: 89.4 mL/min (by C-G formula based on Cr of 0.69).    No Known Allergies  Antimicrobials this admission: Cipro/Flagyl x 1 Zosyn 3/34 >>   Dose adjustments this admission: ---  Microbiology results: No Cx obtained  Thank you for allowing pharmacy to be a part of this patient's care.  Reuel Boom, PharmD, BCPS Pager: [phone] 04/17/2015, 7:20 AM

## 2015-04-17 NOTE — Progress Notes (Signed)
Patient ID: Carla Clark, female   DOB: 1985-10-26, 30 y.o.   MRN: [id number]  Accepted to a MedSurg bed at [facility]. Please call the floor manager at extension [phone] for admitting physician assignment upon patient's arrival to the facility.   Per Etta Quill, NP  Chief Complaint  Patient presents with  . Abdominal Pain   HPI Comments: Patient with history of crohn's. Is taking humira every 14 days, but missed her two doses last month. Most recent dose last week. Her RLQ discomfort is consistent with prior flares of crohn's.  Patient is a 30 y.o. female presenting with abdominal pain. The history is provided by the patient. No language interpreter was used.  Abdominal Pain Pain location: Periumbilical, epigastric and RLQ Pain quality: gnawing and pressure  Pain severity: Mild Onset quality: Sudden Duration: 1 day Timing: Sporadic Progression: Waxing and waning Chronicity: Recurrent Context comment: Patient with history of Crohn's Associated symptoms: nausea and vomiting  Risk factors: not pregnant                       Results- Last 72 Hrs      Component Value Units   Comprehensive metabolic panel [[id number]] (Abnormal) Collected: 04/16/15 1910   Updated: 04/16/15 1946    Specimen Type: Blood    Specimen Source: Vein     Sodium 138 mmol/L    Potassium 4.2 mmol/L    Chloride 104 mmol/L    CO2 23 mmol/L    Glucose, Bld 84 mg/dL    BUN 9 mg/dL    Creatinine, Ser 0.73 mg/dL    Calcium 9.9 mg/dL    Total Protein 8.2 (H) g/dL    Albumin 4.8 g/dL    AST 19 U/L    ALT 23 U/L    Alkaline Phosphatase 59 U/L    Total Bilirubin 0.9 mg/dL    GFR calc non Af Amer >60 mL/min    GFR calc Af Amer >60 mL/min    Anion gap 11   Lipase, blood [[id number]] Collected: 04/16/15 1910   Updated: 04/16/15 1946    Specimen Type: Blood    Specimen Source: Vein     Lipase 42 U/L   CBC with Differential/Platelet [[id number]]  (Abnormal) Collected: 04/16/15 1910   Updated: 04/16/15 1917    Specimen Type: Blood    Specimen Source: Vein     WBC 16.4 (H) K/uL    RBC 5.22 (H) MIL/uL    Hemoglobin 15.1 (H) g/dL    HCT 45.2 %    MCV 86.6 fL    MCH 28.9 pg    MCHC 33.4 g/dL    RDW 13.3 %    Platelets 304 K/uL    Neutrophils Relative % 86 %    Neutro Abs 14.1 (H) K/uL    Lymphocytes Relative 8 %    Lymphs Abs 1.3 K/uL    Monocytes Relative 6 %    Monocytes Absolute 0.9 K/uL    Eosinophils Relative 0 %    Eosinophils Absolute 0.0 K/uL    Basophils Relative 0 %    Basophils Absolute 0.0 K/uL   Urine microscopic-add on [[id number]] (Abnormal) Collected: 04/16/15 1720   Updated: 04/16/15 1756     Squamous Epithelial / LPF 0-5 (A)    WBC, UA 0-5 WBC/hpf    RBC / HPF 0-5 RBC/hpf    Bacteria, UA RARE (A)    Casts GRANULAR CAST (A)    Urine-Other MUCOUS PRESENT   Urinalysis,  Routine w reflex microscopic (not at [facility]) [[id number]] (Abnormal) Collected: 04/16/15 1720   Updated: 04/16/15 1756    Specimen Type: Urine    Specimen Source: Urine, Clean Catch     Color, Urine YELLOW    APPearance CLEAR    Specific Gravity, Urine 1.021    pH 6.0    Glucose, UA NEGATIVE mg/dL    Hgb urine dipstick TRACE (A)    Bilirubin Urine NEGATIVE    Ketones, ur 40 (A) mg/dL    Protein, ur NEGATIVE mg/dL    Nitrite NEGATIVE    Leukocytes, UA NEGATIVE   Pregnancy, urine [[id number]] Collected: 04/16/15 1720   Updated: 04/16/15 1748    Specimen Type: Urine    Specimen Source: Urine, Clean Catch     Preg Test, Ur NEGATIVE     CT ABDOMEN AND PELVIS WITH CONTRAST  IMPRESSION:  Wall thickening in the terminal ileum consistent with inflammatory changes due to known Crohn's disease. There is proximal small bowel obstruction, likely due to inflammatory strictures. Infiltration in the right lower quadrant fat may indicate fistula or inflammatory reaction. Small amount of free  fluid in the pelvis is likely reactive. No discrete abscess identified.   Electronically Signed  By: Lucienne Capers M.D.  On: 04/16/2015 22:46 ---------------------------------------------------------------------------------------------------------------  Tennis Must, M.D.

## 2015-04-17 NOTE — H&P (Signed)
[facility] History and Physical  Carla Clark DVV:[id number] DOB: [birth year] DOA: 04/16/2015  Referring physician: Patient was transferred from [facility]. PCP: Penni Homans, MD  Specialists: Dr. Hilarie Fredrickson.  Chief Complaint: Abdominal pain and nausea vomiting.  HPI: Carla Clark is a 30 y.o. female with no history of Crohn's disease on Humira presents to the ER because of abdominal pain since yesterday morning. Patient's abdominal pain is mostly periumbilical and right lower quadrant. Had one episode of diarrhea followed by nausea and vomiting in the evening. Denies any fever or chills. CT abdomen and pelvis shows inflammation of the terminal ileum with possible small bowel obstruction due to strictures and inflammatory changes. On call surgeon Dr. Redmond Pulling was consulted by the ER PA who at this time is recommended hospitalization admission with gastroenterologist consult and to reconsult surgery if required.. Dr. Ardis Hughs was consulted by the ER physician. Patient has received Solu-Medrol antibiotics and pain medications in the ER following which patient's pain is improved.  Review of Systems: As presented in the history of presenting illness, rest negative.  Past Medical History  Diagnosis Date  . Chicken pox as a child  . Colitis 11/06/2011  . Preventative health care 11/06/2011  . Anemia 01/27/2012  . Contraceptive management 01/27/2012  . Breast mass in female 04/21/2012  . Crohn's disease ([place])   . Cervical cancer screening 01/27/2012  . Other specified iron deficiency anemias 01/21/2013  . Crohn's disease ([place]) 08/07/2012  . Vitamin D deficiency   . GERD (gastroesophageal reflux disease)    Past Surgical History  Procedure Laterality Date  . Wisdom tooth extraction  30 yrs old   Social History:  reports that she has never smoked. She has never used smokeless tobacco. She reports that she drinks alcohol. She reports that she does not use illicit drugs. Where does  patient live Home. Can patient participate in ADLs? Yes.  No Known Allergies  Family History:  Family History  Problem Relation Age of Onset  . Nephrolithiasis Father   . Lung cancer Maternal Grandmother   . Gout Maternal Grandfather   . Ovarian cancer Paternal Grandmother   . Cancer Paternal Grandmother 66    ovarian  . Pancreatic cancer Paternal Grandfather   . Cancer Paternal Aunt     ovarian      Prior to Admission medications   Medication Sig Start Date End Date Taking? Authorizing Provider  Cetirizine HCl (ZYRTEC ALLERGY PO) Take by mouth 2 (two) times daily.   Yes Historical Provider, MD  desogestrel-ethinyl estradiol (VIORELE) 0.15-0.02/0.01 MG (21/5) tablet Take 1 tablet by mouth daily. 02/06/15  Yes Mosie Lukes, MD  diphenhydrAMINE (BENADRYL) 25 mg capsule Take 25 mg by mouth at bedtime as needed for allergies.    Yes Historical Provider, MD  HUMIRA PEN 40 MG/0.8ML PNKT Inject 1 pen into the skin every 14 (fourteen) days. 03/07/14  Yes Jerene Bears, MD  Probiotic Product (MISC INTESTINAL FLORA REGULAT) [place] by mouth every other day. Digestive Health probiotic gummies by [employer] 11/04/11  Yes Mosie Lukes, MD    Physical Exam: Filed Vitals:   04/17/15 0030 04/17/15 0100 04/17/15 0236 04/17/15 0330  BP: 111/68 117/70 118/76 122/61  Pulse: 91 84 95 83  Temp:    98.1 F (36.7 C)  TempSrc:    Oral  Resp: 18  18 18   Height:      Weight:      SpO2: 98% 100% 100% 98%  General:  Moderately built and nourished.  Eyes: Anicteric, no pallor.  ENT: No discharge from the ears eyes nose and mouth.  Neck: No neck rigidity.  Cardiovascular: S1 and S2 heard.  Respiratory: No rhonchi or crepitations.  Abdomen: Mild tenderness in the right lower quadrant.  Skin: No rash.  Musculoskeletal: No edema.  Psychiatric: Appears normal.  Neurologic: Alert awake oriented to time place and person. Moves all extremities.  Labs on Admission:  Basic Metabolic  Panel:  Recent Labs Lab 04/16/15 1910  NA 138  K 4.2  CL 104  CO2 23  GLUCOSE 84  BUN 9  CREATININE 0.73  CALCIUM 9.9   Liver Function Tests:  Recent Labs Lab 04/16/15 1910  AST 19  ALT 23  ALKPHOS 59  BILITOT 0.9  PROT 8.2*  ALBUMIN 4.8    Recent Labs Lab 04/16/15 1910  LIPASE 42   No results for input(s): AMMONIA in the last 168 hours. CBC:  Recent Labs Lab 04/16/15 1910  WBC 16.4*  NEUTROABS 14.1*  HGB 15.1*  HCT 45.2  MCV 86.6  PLT 304   Cardiac Enzymes: No results for input(s): CKTOTAL, CKMB, CKMBINDEX, TROPONINI in the last 168 hours.  BNP (last 3 results) No results for input(s): BNP in the last 8760 hours.  ProBNP (last 3 results) No results for input(s): PROBNP in the last 8760 hours.  CBG: No results for input(s): GLUCAP in the last 168 hours.  Radiological Exams on Admission: Ct Abdomen Pelvis W Contrast  04/16/2015  CLINICAL DATA:  Supraumbilical pain with nausea and vomiting for 16 hours. History of Crohn disease. EXAM: CT ABDOMEN AND PELVIS WITH CONTRAST TECHNIQUE: Multidetector CT imaging of the abdomen and pelvis was performed using the standard protocol following bolus administration of intravenous contrast. CONTRAST:  160m ISOVUE-300 IOPAMIDOL (ISOVUE-300) INJECTION 61% COMPARISON:  09/05/2012 FINDINGS: Lung bases are clear. The liver, spleen, pancreas, adrenal glands, kidneys, abdominal aorta, inferior vena cava, and retroperitoneal lymph nodes are unremarkable. Stomach appears normal. Small bowel are dilated and fluid-filled. Decompression of distal small bowel. Transition zone appears to be in the right lower quadrant. The wall of the terminal ileum is diffusely thickened and there is infiltration in the mesenteric fat of the right lower quadrant. Obstruction is likely due to inflammatory strictures. Infiltration in the right lower quadrant fat may indicate inflammation or fistula. No discrete abscess is identified. Colon is mostly  decompressed. No free air or free fluid in the abdomen. Pelvis: The appendix is normal. There is free fluid in the pelvis, likely reactive. Involuting cyst in the right ovary. Uterus and ovaries are not enlarged. Bladder wall is not thickened. No destructive bone lesions. IMPRESSION: Wall thickening in the terminal ileum consistent with inflammatory changes due to known Crohn's disease. There is proximal small bowel obstruction, likely due to inflammatory strictures. Infiltration in the right lower quadrant fat may indicate fistula or inflammatory reaction. Small amount of free fluid in the pelvis is likely reactive. No discrete abscess identified. Electronically Signed   By: WLucienne CapersM.D.   On: 04/16/2015 22:46     Assessment/Plan Principal Problem:   Exacerbation of Crohn's disease of small intestine (HCC) Active Problems:   Crohn's colitis ([place]   1. Acute exacerbation of Crohn's disease with possible obstruction - I have place patient nothing by mouth and continued on IV steroids and also for now placed patient on antibiotics. Consult patient's gastroenterologist Dr. PHilarie Fredricksonin a.m. Continue with IV fluids and pain relief medications. 2. History  of iron deficiency anemia - he was to receive IV iron therapy. Patient's hemoglobin appears to be stable.   DVT Prophylaxis SCDs for now.  Code Status: Full code.  Family Communication: Discussed with patient.  Disposition Plan: Admit to inpatient.    Rowen Wilmer N. [facility] Pager [phone].   If 7PM-7AM, please contact night-coverage [email or web address] Password [facility] 04/17/2015, 5:59 AM

## 2015-04-17 NOTE — Consult Note (Signed)
[facility] Consult: 9:30 AM 04/17/2015  LOS: 0 days    Referring Provider: Dr Eliseo Squires  Primary Care Physician:  Penni Homans, MD Primary Gastroenterologist:  Dr. Hilarie Fredrickson    Reason for Consultation:  Abdominal pain, n/v in Crohns pt   HPI: Carla Clark is a 30 y.o. female.  Hx B12 and IDA: intolerant to po iron and Dr Marin Olp feels she is likely not absorbing, treated once in 01/2013 with Feraheme.  No longer on weekly nasal B12.  Vitamin D deficiency, not on supplement.   Diagnosed with Crohn's ileitis in 2013.    11/2011 CRP 56, positive pASCA of 64 (normal 0-50), negative ACCA/ALCA/AMCA/pANCA 10/2011 CT showed ileitis.   11/2011 Colonoscopy. ( Dr Sharlett Iles) with normal colon but MD unable to intubate the TI.    11/2011 CT enterography: improved ileitis, ? Developing entero-enteric fistula.    08/2012 CT enterography: terminal ileitis, 2 cm abscess at TI, suggestion of fistula, inflammatory stricture  10 cm proximal to IC valve, short segment of inflammation 25 cm from IC valve c/w second area of inflammation/skip lesion. Initially treated with just Budesonide but transitioned to Humira in 12/2012 and Budesonide weaned off.  This occurred after second opinion with Dr Candis Shine at [facility] and Dr Hilarie Fredrickson. Getting Humira every 14 days.    Last GI OV 03/03/2014, doing well and plan was for ROV ~ 02/2015, plenty of phone contact with pt since then.  .  Pt doing well at home, daily formed BMs, no bleeding.  Good appetite, stable weight.  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  She neglected to order her Humira and missed 2 doses.  She is now back on track, gave the Humira last Wednesday 3/15.   630  AM 04/16/15 awakened with "stomach" pain, sharp but not intense in the mid upper abdomen. Diarrheal stool ~ 8 AM  Lingered all day.  330 PM: nausea and brown, bad smelling emesis.  Pain continued and worsened.  Came to ED.  After Fentanyl, oral contrast, she had episode of yellow clear emesis.   WBCs to 16.4.  No anemia.  Chemistries unremarkable except glucose 114. .   CT scan last night:   Terminal ileal Crohn's. Proximal SBO.  Infiltration to RLQ fat/?fistula vs inflammation.  Small amount pelvic FF.  No abscess.    Started on abx, Solumedrol, NPO. Pt has reflux sxs on occasion, relieved with mints. Some recurrence of abdominal discomfort and queasiness this AM with small amount of water. Pain is not severe and she has not required additional anelgesics or anti-emetics. No fever or sweats.  No joint pain.  No acute or chronic vision issues other than distance vision is mildly compromised and she feels like she needs eyesight tested. No rash, sores.  No heavy period bleeding.  Rarely uses Ibuprofen for headaches and gets headaches if she misses daily coffee.     Past Medical History  Diagnosis Date  .  Chicken pox as a child  . Anemia 11/2011    iron deficiency. 01/2013 parenteral iron. Intranasal B12.  Dr Marin Olp follows.   . Breast mass in female 04/21/2012  . Cervical cancer screening 01/27/2012  . Crohn's disease ([place]) 2013    ileitis 10/2011. ileitis, mesenteric abscess, ? entero-enteric fistula on 08/2012 CT enterography  . Vitamin D deficiency 2014  . GERD (gastroesophageal reflux disease)   . Hepatitis B immune 07/2012    previous vaccination.   . B12 deficiency 11/2012    Past Surgical History  Procedure Laterality Date  . Wisdom tooth extraction  30 yrs old    Prior to Admission medications   Medication Sig Start Date End Date Taking? Authorizing Provider  Cetirizine HCl (ZYRTEC ALLERGY PO) Take by mouth 2 (two) times daily.   Yes Historical Provider, MD  desogestrel-ethinyl  estradiol (VIORELE) 0.15-0.02/0.01 MG (21/5) tablet Take 1 tablet by mouth daily. 02/06/15  Yes Mosie Lukes, MD  diphenhydrAMINE (BENADRYL) 25 mg capsule Take 25 mg by mouth at bedtime as needed for allergies.    Yes Historical Provider, MD  HUMIRA PEN 40 MG/0.8ML PNKT Inject 1 pen into the skin every 14 (fourteen) days. 03/07/14  Yes Jerene Bears, MD  Probiotic Product (MISC INTESTINAL FLORA REGULAT) [place] by mouth every other day. Digestive [employer] probiotic gummies by [employer] 11/04/11  Yes Mosie Lukes, MD    Scheduled Meds: . antiseptic oral rinse  7 mL Mouth Rinse q12n4p  . chlorhexidine  15 mL Mouth Rinse BID  . methylPREDNISolone (SOLU-MEDROL) injection  40 mg Intravenous Daily  . piperacillin-tazobactam  3.375 g Intravenous Once  . piperacillin-tazobactam (ZOSYN)  IV  3.375 g Intravenous 3 times per day   Infusions: . sodium chloride 75 mL/hr at 04/17/15 0605   PRN Meds: acetaminophen **OR** acetaminophen, fentaNYL (SUBLIMAZE) injection, ondansetron **OR** ondansetron (ZOFRAN) IV   Allergies as of 04/16/2015  . (No Known Allergies)    Family History  Problem Relation Age of Onset  . Nephrolithiasis Father   . Lung cancer Maternal Grandmother   . Gout Maternal Grandfather   . Ovarian cancer Paternal Grandmother   . Cancer Paternal Grandmother 60    ovarian  . Pancreatic cancer Paternal Grandfather   . Cancer Paternal Aunt     ovarian    Social History   Social History  . Marital Status: Single    Spouse Name: N/A  . Number of Children: N/A  . Years of Education: N/A   Occupational History  . teacher    Social History Main Topics  . Smoking status: Never Smoker   . Smokeless tobacco: Never Used     Comment: never used tobacco  . Alcohol Use: 0.0 oz/week    0 Standard drinks or equivalent per week     Comment: occasionaly  . Drug Use: No  . Sexual Activity:    Partners: Male    Birth Control/ Protection: Pill     Comment: lives with husband, works  at [employer] at [employer]  . Not on file   Social History Narrative    REVIEW OF SYSTEMS: Constitutional:  Generally good energy level ENT:  No nose bleeds Pulm:  No cough or dyspnea CV:  No palpitations, no LE edema.  GU:  No hematuria, no frequency GI:  Per HPI.   Heme:  No unusual bleeding or bruising.     Transfusions:  none Neuro:  No headaches, no peripheral tingling or  numbness Derm:  No itching, no rash or sores.  Endocrine:  No sweats or chills.  No polyuria or dysuria Immunization:  Hep B vaccinated, reviewed records and multiple shots completed.  No recent flu shot.  Travel:  None beyond local counties in last few months.    PHYSICAL EXAM: Vital signs in last 24 hours: Filed Vitals:   04/17/15 0330 04/17/15 0600  BP: 122/61 115/62  Pulse: 83 90  Temp: 98.1 F (36.7 C) 98.8 F (37.1 C)  Resp: 18 18   Wt Readings from Last 3 Encounters:  04/17/15 59.467 kg (131 lb 1.6 oz)  01/20/15 62.143 kg (137 lb)  07/14/14 64.411 kg (142 lb)    General: pleasant, comfortable WF.  Does not look ill Head:  No swelling or asymmetry  Eyes:  No pallor, no icterus.  EOMI Ears:  Not HOH  Nose:  No congestion or discharge Mouth:  Clear and moist.  Good dental health Neck:  No mass, no TMG Lungs:  Clear bil.  No cough or labored breathing Heart: RRR.  No MRG.  S1/S2 present Abdomen:  Soft, ND.  BS hypoactive and not tinkling or high-pitched.  No succussion splash.  Tender in lower/mid abdomen bilaterally.  No guard or rebound.   Rectal: deferred   Musc/Skeltl: no joint swelling or redness Extremities:  No CCE  Neurologic:  Oriented x 3.  Fully alert.  Excellent historian.  No limb weakness or tremor Skin:  No rash, sores or telangectasis Tattoos:  none   Psych:  Calm, pleasant, in good spirits. Cooperative.   Intake/Output from previous day: 03/23 0701 - 03/24 0700 In: 550 [I.V.:450; IV Piggyback:100] Out: -  Intake/Output this shift:    LAB  RESULTS:  Recent Labs  04/16/15 1910 04/17/15 0618  WBC 16.4* 11.1*  HGB 15.1* 13.0  HCT 45.2 38.1  PLT 304 302   BMET Lab Results  Component Value Date   NA 137 04/17/2015   NA 138 04/16/2015   NA 140 08/25/2014   K 4.3 04/17/2015   K 4.2 04/16/2015   K 4.6 08/25/2014   CL 109 04/17/2015   CL 104 04/16/2015   CL 107 08/25/2014   CO2 19* 04/17/2015   CO2 23 04/16/2015   CO2 26 08/25/2014   GLUCOSE 114* 04/17/2015   GLUCOSE 84 04/16/2015   GLUCOSE 87 08/25/2014   BUN 11 04/17/2015   BUN 9 04/16/2015   BUN 10 08/25/2014   CREATININE 0.69 04/17/2015   CREATININE 0.73 04/16/2015   CREATININE 0.74 08/25/2014   CALCIUM 9.1 04/17/2015   CALCIUM 9.9 04/16/2015   CALCIUM 9.9 08/25/2014   LFT  Recent Labs  04/16/15 1910 04/17/15 0618  PROT 8.2* 7.0  ALBUMIN 4.8 3.8  AST 19 16  ALT 23 20  ALKPHOS 59 43  BILITOT 0.9 1.1   PT/INR No results found for: INR, PROTIME Hepatitis Panel No results for input(s): HEPBSAG, HCVAB, HEPAIGM, HEPBIGM in the last 72 hours. C-Diff No components found for: CDIFF Lipase     Component Value Date/Time   LIPASE 42 04/16/2015 1910    Drugs of Abuse  No results found for: LABOPIA, COCAINSCRNUR, LABBENZ, AMPHETMU, THCU, LABBARB   RADIOLOGY STUDIES: Ct Abdomen Pelvis W Contrast  04/16/2015  CLINICAL DATA:  Supraumbilical pain with nausea and vomiting for 16 hours. History of Crohn disease. EXAM: CT ABDOMEN AND PELVIS WITH CONTRAST TECHNIQUE: Multidetector CT imaging of the abdomen and pelvis was performed using the standard protocol following bolus administration of  intravenous contrast. CONTRAST:  171m ISOVUE-300 IOPAMIDOL (ISOVUE-300) INJECTION 61% COMPARISON:  09/05/2012 FINDINGS: Lung bases are clear. The liver, spleen, pancreas, adrenal glands, kidneys, abdominal aorta, inferior vena cava, and retroperitoneal lymph nodes are unremarkable. Stomach appears normal. Small bowel are dilated and fluid-filled. Decompression of distal  small bowel. Transition zone appears to be in the right lower quadrant. The wall of the terminal ileum is diffusely thickened and there is infiltration in the mesenteric fat of the right lower quadrant. Obstruction is likely due to inflammatory strictures. Infiltration in the right lower quadrant fat may indicate inflammation or fistula. No discrete abscess is identified. Colon is mostly decompressed. No free air or free fluid in the abdomen. Pelvis: The appendix is normal. There is free fluid in the pelvis, likely reactive. Involuting cyst in the right ovary. Uterus and ovaries are not enlarged. Bladder wall is not thickened. No destructive bone lesions. IMPRESSION: Wall thickening in the terminal ileum consistent with inflammatory changes due to known Crohn's disease. There is proximal small bowel obstruction, likely due to inflammatory strictures. Infiltration in the right lower quadrant fat may indicate fistula or inflammatory reaction. Small amount of free fluid in the pelvis is likely reactive. No discrete abscess identified. Electronically Signed   By: WLucienne CapersM.D.   On: 04/16/2015 22:46    ENDOSCOPIC STUDIES: Per HPI  IMPRESSION:   *  Crohn's flare with partial SBO.  May be due to her missing 2 doses of Humira last month, but restarted Humira on 3/15.     PLAN:     *  Ccontinue IV Solumedrol.  Increased IVF to 125/hour. Continue NPO except ice chips.  *  KUB in AM.   *  As no current n/v or bad pain, no need to place NGT.    *  Adding Famotidine for hx GERD.  *  Stop abx as no evidence for abscess.   *  Switch to less frequent glucose checks, if reaching range where she might need insulin, then can increase frequency of CBGs.     SLyrika Souders 04/17/2015, 9:30 AM Pager: [phone]    Attending physician's note   I have taken a history, examined the patient and reviewed the chart. I agree with the Advanced Practitioner's note, impression and recommendations. History of  fistulizing Crohn's ileitis admitted with partial SBO. Missed 2 doses of Humira last month. Abdominal pain has substantially improved and no N/V for over 12 hours. Continue IV Solumedrol. KUB in am. Trial of clears as tolerated this evening.   MLucio Edward MD FMarval [phone]-Fri 8a-5p [phone] 5p, weekends, holidays

## 2015-04-17 NOTE — Care Management Note (Signed)
Case Management Note  Patient Details  Name: Carla Clark MRN: [id number] Date of Birth: December 22, 1985  Subjective/Objective:  30 y/o f admitted w/Crohn's. From home.                  Action/Plan:d/c plan home.   Expected Discharge Date:                  Expected Discharge Plan:  Home/Self Care  In-House Referral:     Discharge planning Services  CM Consult  Post Acute Care Choice:    Choice offered to:     DME Arranged:    DME Agency:     HH Arranged:    HH Agency:     Status of Service:  In process, will continue to follow  Medicare Important Message Given:    Date Medicare IM Given:    Medicare IM give by:    Date Additional Medicare IM Given:    Additional Medicare Important Message give by:     If discussed at [place] of Stay Meetings, dates discussed:    Additional Comments:  Dessa Phi, RN 04/17/2015, 9:36 AM

## 2015-04-17 NOTE — Progress Notes (Signed)
Patient admitted after midnight, please see H&P.  Feeling better but hungry.  Await GI eval-- continue abx and steroids for now  [place]

## 2015-04-18 ENCOUNTER — Inpatient Hospital Stay (HOSPITAL_COMMUNITY): Payer: Commercial Managed Care - PPO

## 2015-04-18 DIAGNOSIS — K56609 Unspecified intestinal obstruction, unspecified as to partial versus complete obstruction: Secondary | ICD-10-CM | POA: Insufficient documentation

## 2015-04-18 LAB — GLUCOSE, CAPILLARY
GLUCOSE-CAPILLARY: 83 mg/dL (ref 65–99)
Glucose-Capillary: 94 mg/dL (ref 65–99)

## 2015-04-18 LAB — CBC
HCT: 36.7 % (ref 36.0–46.0)
HEMOGLOBIN: 12.2 g/dL (ref 12.0–15.0)
MCH: 28.8 pg (ref 26.0–34.0)
MCHC: 33.2 g/dL (ref 30.0–36.0)
MCV: 86.6 fL (ref 78.0–100.0)
PLATELETS: 329 10*3/uL (ref 150–400)
RBC: 4.24 MIL/uL (ref 3.87–5.11)
RDW: 13.3 % (ref 11.5–15.5)
WBC: 12.6 10*3/uL — ABNORMAL HIGH (ref 4.0–10.5)

## 2015-04-18 LAB — BASIC METABOLIC PANEL
Anion gap: 7 (ref 5–15)
BUN: 9 mg/dL (ref 6–20)
CHLORIDE: 110 mmol/L (ref 101–111)
CO2: 24 mmol/L (ref 22–32)
CREATININE: 0.74 mg/dL (ref 0.44–1.00)
Calcium: 9.3 mg/dL (ref 8.9–10.3)
GFR calc Af Amer: >60 mL/min (ref >60)
GFR calc non Af Amer: >60 mL/min (ref >60)
GLUCOSE: 108 mg/dL — AB (ref 65–99)
Potassium: 4.3 mmol/L (ref 3.5–5.1)
SODIUM: 141 mmol/L (ref 135–145)

## 2015-04-18 LAB — C-REACTIVE PROTEIN: CRP: 0.5 mg/dL (ref <1.0)

## 2015-04-18 MED ORDER — METRONIDAZOLE IN NACL 5-0.79 MG/ML-% IV SOLN
500.0000 mg | Freq: Three times a day (TID) | INTRAVENOUS | Status: DC
  Administered 2015-04-18 – 2015-04-20 (×6): 500 mg via INTRAVENOUS
  Filled 2015-04-18 (×7): qty 100

## 2015-04-18 NOTE — Progress Notes (Signed)
Patient ID: Carla Clark, female   DOB: March 25, 1985, 30 y.o.   MRN: [id number]    Progress Note   Subjective  Feeling better-still full feeling and rushing noises in abdomen, no vomiting, no BM KUB today - SBO improved   Objective   Vital signs in last 24 hours: Temp:  [97.9 F (36.6 C)-98.3 F (36.8 C)] 97.9 F (36.6 C) (03/25 0508) Pulse Rate:  [74-79] 76 (03/25 0508) Resp:  [18-19] 19 (03/25 0508) BP: (104-120)/(64) 120/64 mmHg (03/25 0508) SpO2:  [100 %] 100 % (03/25 0508) Last BM Date: 04/16/15   General: young white female in NAD Heart:  Regular rate and rhythm; no murmurs Lungs: Respirations even and unlabored, lungs CTA bilaterally Abdomen:  Soft, tender mid and right LQ, and nondistended.  BS somewhat hyperactive Extremities:  Without edema. Neurologic:  Alert and oriented,  grossly normal neurologically. Psych:  Cooperative. Normal mood and affect.  Intake/Output from previous day:   Intake/Output this shift:    Lab Results:  Recent Labs  04/16/15 1910 04/17/15 0618 04/18/15 0433  WBC 16.4* 11.1* 12.6*  HGB 15.1* 13.0 12.2  HCT 45.2 38.1 36.7  PLT 304 302 329   BMET  Recent Labs  04/16/15 1910 04/17/15 0618 04/18/15 0433  NA 138 137 141  K 4.2 4.3 4.3  CL 104 109 110  CO2 23 19* 24  GLUCOSE 84 114* 108*  BUN 9 11 9   CREATININE 0.73 0.69 0.74  CALCIUM 9.9 9.1 9.3   LFT  Recent Labs  04/17/15 0618  PROT 7.0  ALBUMIN 3.8  AST 16  ALT 20  ALKPHOS 43  BILITOT 1.1    Studies/Results: Dg Abd 1 View  04/18/2015  CLINICAL DATA:  Continued surveillance for Crohn's disease with bowel obstruction. EXAM: ABDOMEN - 1 VIEW COMPARISON:  CT abdomen and pelvis 04/16/2015. FINDINGS: Apparent bowel wall thickening in the RIGHT colon could represent manifestation of Crohn's disease (arrow). The small bowel distention noted on prior CT has improved. No features of bowel obstruction on current radiograph. No visible free air on supine radiograph. Enteric  contrast fills the LEFT colon. IMPRESSION: Improved SBO Electronically Signed   By: Staci Righter M.D.   On: 04/18/2015 09:20   Ct Abdomen Pelvis W Contrast  04/16/2015  CLINICAL DATA:  Supraumbilical pain with nausea and vomiting for 16 hours. History of Crohn disease. EXAM: CT ABDOMEN AND PELVIS WITH CONTRAST TECHNIQUE: Multidetector CT imaging of the abdomen and pelvis was performed using the standard protocol following bolus administration of intravenous contrast. CONTRAST:  166m ISOVUE-300 IOPAMIDOL (ISOVUE-300) INJECTION 61% COMPARISON:  09/05/2012 FINDINGS: Lung bases are clear. The liver, spleen, pancreas, adrenal glands, kidneys, abdominal aorta, inferior vena cava, and retroperitoneal lymph nodes are unremarkable. Stomach appears normal. Small bowel are dilated and fluid-filled. Decompression of distal small bowel. Transition zone appears to be in the right lower quadrant. The wall of the terminal ileum is diffusely thickened and there is infiltration in the mesenteric fat of the right lower quadrant. Obstruction is likely due to inflammatory strictures. Infiltration in the right lower quadrant fat may indicate inflammation or fistula. No discrete abscess is identified. Colon is mostly decompressed. No free air or free fluid in the abdomen. Pelvis: The appendix is normal. There is free fluid in the pelvis, likely reactive. Involuting cyst in the right ovary. Uterus and ovaries are not enlarged. Bladder wall is not thickened. No destructive bone lesions. IMPRESSION: Wall thickening in the terminal ileum consistent with inflammatory changes due  to known Crohn's disease. There is proximal small bowel obstruction, likely due to inflammatory strictures. Infiltration in the right lower quadrant fat may indicate fistula or inflammatory reaction. Small amount of free fluid in the pelvis is likely reactive. No discrete abscess identified. Electronically Signed   By: Lucienne Capers M.D.   On: 04/16/2015 22:46       Assessment / Plan:    #1 30 yo female with Crohns ileitis - on Humira since 12/2012 with good control of sxs. Had skipped 2 doses last month and now with acute exacerbation with proximal SBO felt secondary to inflammatory strictures, and also has active ileitis and infiltration of fat in RLQ ? fistula vs inflammatory, no abscess  Improved on IV Solumedrol, taking some clears - no vomiting but still tender, and no BM's Continue IV Solumedrol 40 mg daily Clear liquids Concerned that Humira not controlling her disease- hard to believe she would go from remission to severe disease after missing only 2 doses- will need to check ADA Ab, and concentration as outpt before next dose (last dose was on 3/15 ) Also consider course of at least flagyl with possible fistula/early infection in RLQ fat   Principal Problem:   Exacerbation of Crohn's disease of small intestine ([place]) Active Problems:   Crohn's colitis ([place])    LOS: 1 day   Amy Esterwood  04/18/2015, 11:15 AM      Attending physician's note   I have taken an interval history, reviewed the chart and examined the patient. I agree with the Advanced Practitioner's note, impression and recommendations. Crohn's ileitis with inflammatory stricture with partial SBO and localized infiltration of RLQ fat-inflammatory or fistula. Symptoms improving. KUB shows resolved SBO. Continue IV Solumedrol and clear liquids today. Will review with Dr. Raquel James regarding outpatient adalimumab Ab/serum concentration and adding AZA/6MP, etc.  Lucio Edward, MD [facility] [phone] Mon-Fri 8a-5p [phone] after 5p, weekends, holidays

## 2015-04-18 NOTE — Progress Notes (Signed)
PROGRESS NOTE  Carla Clark AST:[id number] DOB: 1985-04-21 DOA: 04/16/2015 PCP: Penni Homans, MD  Assessment/Plan: Acute exacerbation of Crohn's disease with SBO- -x ray improved -IV steroids -IV flagyl -GI consult  History of iron deficiency anemia  - Patient's hemoglobin appears to be stable.  Code Status: full Family Communication: patient and family Disposition Plan:    Consultants:  GI  Procedures:      HPI/Subjective: Tolerating clears  Objective: Filed Vitals:   04/17/15 2109 04/18/15 0508  BP: 108/64 120/64  Pulse: 79 76  Temp: 98.3 F (36.8 C) 97.9 F (36.6 C)  Resp: 19 19   No intake or output data in the 24 hours ending 04/18/15 1221 Filed Weights   04/16/15 1712 04/17/15 0330  Weight: 61.236 kg (135 lb) 59.467 kg (131 lb 1.6 oz)    Exam:   General:  awake  Cardiovascular: rrr  Respiratory: clear  Abdomen: +BS, some central tenderness  Musculoskeletal: no edema  Data Reviewed: Basic Metabolic Panel:  Recent Labs Lab 04/16/15 1910 04/17/15 0618 04/18/15 0433  NA 138 137 141  K 4.2 4.3 4.3  CL 104 109 110  CO2 23 19* 24  GLUCOSE 84 114* 108*  BUN 9 11 9   CREATININE 0.73 0.69 0.74  CALCIUM 9.9 9.1 9.3   Liver Function Tests:  Recent Labs Lab 04/16/15 1910 04/17/15 0618  AST 19 16  ALT 23 20  ALKPHOS 59 43  BILITOT 0.9 1.1  PROT 8.2* 7.0  ALBUMIN 4.8 3.8    Recent Labs Lab 04/16/15 1910  LIPASE 42   No results for input(s): AMMONIA in the last 168 hours. CBC:  Recent Labs Lab 04/16/15 1910 04/17/15 0618 04/18/15 0433  WBC 16.4* 11.1* 12.6*  NEUTROABS 14.1* 10.3*  --   HGB 15.1* 13.0 12.2  HCT 45.2 38.1 36.7  MCV 86.6 84.3 86.6  PLT 304 302 329   Cardiac Enzymes: No results for input(s): CKTOTAL, CKMB, CKMBINDEX, TROPONINI in the last 168 hours. BNP (last 3 results) No results for input(s): BNP in the last 8760 hours.  ProBNP (last 3 results) No results for input(s): PROBNP in the last 8760  hours.  CBG:  Recent Labs Lab 04/18/15 0506 04/18/15 0751  GLUCAP 83 94    No results found for this or any previous visit (from the past 240 hour(s)).   Studies: Dg Abd 1 View  04/18/2015  CLINICAL DATA:  Continued surveillance for Crohn's disease with bowel obstruction. EXAM: ABDOMEN - 1 VIEW COMPARISON:  CT abdomen and pelvis 04/16/2015. FINDINGS: Apparent bowel wall thickening in the RIGHT colon could represent manifestation of Crohn's disease (arrow). The small bowel distention noted on prior CT has improved. No features of bowel obstruction on current radiograph. No visible free air on supine radiograph. Enteric contrast fills the LEFT colon. IMPRESSION: Improved SBO Electronically Signed   By: Staci Righter M.D.   On: 04/18/2015 09:20   Ct Abdomen Pelvis W Contrast  04/16/2015  CLINICAL DATA:  Supraumbilical pain with nausea and vomiting for 16 hours. History of Crohn disease. EXAM: CT ABDOMEN AND PELVIS WITH CONTRAST TECHNIQUE: Multidetector CT imaging of the abdomen and pelvis was performed using the standard protocol following bolus administration of intravenous contrast. CONTRAST:  128m ISOVUE-300 IOPAMIDOL (ISOVUE-300) INJECTION 61% COMPARISON:  09/05/2012 FINDINGS: Lung bases are clear. The liver, spleen, pancreas, adrenal glands, kidneys, abdominal aorta, inferior vena cava, and retroperitoneal lymph nodes are unremarkable. Stomach appears normal. Small bowel are dilated and fluid-filled. Decompression of distal small  bowel. Transition zone appears to be in the right lower quadrant. The wall of the terminal ileum is diffusely thickened and there is infiltration in the mesenteric fat of the right lower quadrant. Obstruction is likely due to inflammatory strictures. Infiltration in the right lower quadrant fat may indicate inflammation or fistula. No discrete abscess is identified. Colon is mostly decompressed. No free air or free fluid in the abdomen. Pelvis: The appendix is normal.  There is free fluid in the pelvis, likely reactive. Involuting cyst in the right ovary. Uterus and ovaries are not enlarged. Bladder wall is not thickened. No destructive bone lesions. IMPRESSION: Wall thickening in the terminal ileum consistent with inflammatory changes due to known Crohn's disease. There is proximal small bowel obstruction, likely due to inflammatory strictures. Infiltration in the right lower quadrant fat may indicate fistula or inflammatory reaction. Small amount of free fluid in the pelvis is likely reactive. No discrete abscess identified. Electronically Signed   By: Lucienne Capers M.D.   On: 04/16/2015 22:46    Scheduled Meds: . famotidine (PEPCID) IV  20 mg Intravenous Q12H  . methylPREDNISolone (SOLU-MEDROL) injection  40 mg Intravenous Daily  . metronidazole  500 mg Intravenous Q8H   Continuous Infusions: . dextrose 5 % and 0.45 % NaCl with KCl 10 mEq/L 125 mL/hr at 04/18/15 7106   Antibiotics Given (last 72 hours)    Date/Time Action Medication Dose Rate   04/17/15 0953 Given   piperacillin-tazobactam (ZOSYN) IVPB 3.375 g 3.375 g 100 mL/hr      Principal Problem:   Exacerbation of Crohn's disease of small intestine ([place]) Active Problems:   Crohn's colitis ([place])    Time spent: 25 min    [facility] Pager [phone]. If 7PM-7AM, please contact night-coverage at [email or web address], password [facility] 04/18/2015, 12:21 PM  LOS: 1 day

## 2015-04-19 LAB — GLUCOSE, CAPILLARY
GLUCOSE-CAPILLARY: 84 mg/dL (ref 65–99)
GLUCOSE-CAPILLARY: 88 mg/dL (ref 65–99)

## 2015-04-19 NOTE — Progress Notes (Signed)
Patient ID: Carla Clark, female   DOB: 10-Dec-1985, 30 y.o.   MRN: [id number]    Progress Note   Subjective  Feels ok- no pain, lots of rumbling in abd-no nausea or vomiting-having loose BM's. Some heartburn   Objective   Vital signs in last 24 hours: Temp:  [98 F (36.7 C)-98.2 F (36.8 C)] 98.1 F (36.7 C) (03/26 0603) Pulse Rate:  [65-68] 65 (03/25 2213) Resp:  [16-18] 18 (03/26 0603) BP: (98-106)/(53-63) 98/53 mmHg (03/26 0603) SpO2:  [100 %] 100 % (03/26 0603) Last BM Date: 04/16/15 General: young   white female in NAD Heart:  Regular rate and rhythm; no murmurs Lungs: Respirations even and unlabored, lungs CTA bilaterally Abdomen:  Soft, mildly tender RLQ,and nondistended. Normal bowel sounds. Extremities:  Without edema. Neurologic:  Alert and oriented,  grossly normal neurologically. Psych:  Cooperative. Normal mood and affect.  Intake/Output from previous day: 03/25 0701 - 03/26 0700 In: 2220 [P.O.:720; I.V.:1500] Out: -  Intake/Output this shift:    Lab Results:  Recent Labs  04/16/15 1910 04/17/15 0618 04/18/15 0433  WBC 16.4* 11.1* 12.6*  HGB 15.1* 13.0 12.2  HCT 45.2 38.1 36.7  PLT 304 302 329   BMET  Recent Labs  04/16/15 1910 04/17/15 0618 04/18/15 0433  NA 138 137 141  K 4.2 4.3 4.3  CL 104 109 110  CO2 23 19* 24  GLUCOSE 84 114* 108*  BUN 9 11 9   CREATININE 0.73 0.69 0.74  CALCIUM 9.9 9.1 9.3   LFT  Recent Labs  04/17/15 0618  PROT 7.0  ALBUMIN 3.8  AST 16  ALT 20  ALKPHOS 43  BILITOT 1.1   PT/INR No results for input(s): LABPROT, INR in the last 72 hours.  Studies/Results: Dg Abd 1 View  04/18/2015  CLINICAL DATA:  Continued surveillance for Crohn's disease with bowel obstruction. EXAM: ABDOMEN - 1 VIEW COMPARISON:  CT abdomen and pelvis 04/16/2015. FINDINGS: Apparent bowel wall thickening in the RIGHT colon could represent manifestation of Crohn's disease (arrow). The small bowel distention noted on prior CT has  improved. No features of bowel obstruction on current radiograph. No visible free air on supine radiograph. Enteric contrast fills the LEFT colon. IMPRESSION: Improved SBO Electronically Signed   By: Staci Righter M.D.   On: 04/18/2015 09:20      Assessment / Plan:    #1 30 yo female with Crohns enteritis, maintained on Humira, admitted with exacerbation with partial SBO secondary to inflammatory appearing strictures in more proximal small bowel, and also has inflammatory process in terminal ileum and surrounding fat  Improving on Solumedrol Will advance to full liquids today Continue IV Solumedrol If doing well tomorrow can discharge on prednisone 40 mg po QAM Will have her come to office lab this week prior to next Humira injection and get ADA Ab/concentration labs drawn  Principal Problem:   Exacerbation of Crohn's disease of small intestine ([place]) Active Problems:   Crohn's colitis ([place])   SBO (small bowel obstruction) ([place])    LOS: 2 days   Amy Esterwood  04/19/2015, 9:22 AM      Attending physician's note   I have taken an interval history, reviewed the chart and examined the patient. I agree with the Advanced Practitioner's note, impression and recommendations. Steadily improving. Trial of full liquids today. Continue IV Solumedrol. If she continues to improved consider discharge tomorrow.   Lucio Edward, MD [facility] [phone] Mon-Fri 8a-5p [phone] after 5p, weekends, holidays

## 2015-04-19 NOTE — Progress Notes (Signed)
PROGRESS NOTE  Carla Clark WVP:[id number] DOB: 1985/01/29 DOA: 04/16/2015 PCP: Penni Homans, MD  Assessment/Plan: Acute exacerbation of Crohn's disease with SBO- -advance diet -IV steroids -IV flagyl -GI consult  History of iron deficiency anemia  - Patient's hemoglobin appears to be stable.  Code Status: full Family Communication: patient and family Disposition Plan: home in AM?   Consultants:  GI  Procedures:      HPI/Subjective: Loose stools Tolerating diet with out nausea  Objective: Filed Vitals:   04/18/15 2213 04/19/15 0603  BP: 98/63 98/53  Pulse: 65   Temp: 98 F (36.7 C) 98.1 F (36.7 C)  Resp: 18 18    Intake/Output Summary (Last 24 hours) at 04/19/15 1119 Last data filed at 04/19/15 0845  Gross per 24 hour  Intake   2340 ml  Output      0 ml  Net   2340 ml   Filed Weights   04/16/15 1712 04/17/15 0330  Weight: 61.236 kg (135 lb) 59.467 kg (131 lb 1.6 oz)    Exam:   General:  awake  Cardiovascular: rrr  Respiratory: clear  Abdomen: +BS, lessened tenderness  Musculoskeletal: no edema  Data Reviewed: Basic Metabolic Panel:  Recent Labs Lab 04/16/15 1910 04/17/15 0618 04/18/15 0433  NA 138 137 141  K 4.2 4.3 4.3  CL 104 109 110  CO2 23 19* 24  GLUCOSE 84 114* 108*  BUN 9 11 9   CREATININE 0.73 0.69 0.74  CALCIUM 9.9 9.1 9.3   Liver Function Tests:  Recent Labs Lab 04/16/15 1910 04/17/15 0618  AST 19 16  ALT 23 20  ALKPHOS 59 43  BILITOT 0.9 1.1  PROT 8.2* 7.0  ALBUMIN 4.8 3.8    Recent Labs Lab 04/16/15 1910  LIPASE 42   No results for input(s): AMMONIA in the last 168 hours. CBC:  Recent Labs Lab 04/16/15 1910 04/17/15 0618 04/18/15 0433  WBC 16.4* 11.1* 12.6*  NEUTROABS 14.1* 10.3*  --   HGB 15.1* 13.0 12.2  HCT 45.2 38.1 36.7  MCV 86.6 84.3 86.6  PLT 304 302 329   Cardiac Enzymes: No results for input(s): CKTOTAL, CKMB, CKMBINDEX, TROPONINI in the last 168 hours. BNP (last 3  results) No results for input(s): BNP in the last 8760 hours.  ProBNP (last 3 results) No results for input(s): PROBNP in the last 8760 hours.  CBG:  Recent Labs Lab 04/18/15 0506 04/18/15 0751 04/19/15 0552 04/19/15 0751  GLUCAP 83 94 84 88    No results found for this or any previous visit (from the past 240 hour(s)).   Studies: Dg Abd 1 View  04/18/2015  CLINICAL DATA:  Continued surveillance for Crohn's disease with bowel obstruction. EXAM: ABDOMEN - 1 VIEW COMPARISON:  CT abdomen and pelvis 04/16/2015. FINDINGS: Apparent bowel wall thickening in the RIGHT colon could represent manifestation of Crohn's disease (arrow). The small bowel distention noted on prior CT has improved. No features of bowel obstruction on current radiograph. No visible free air on supine radiograph. Enteric contrast fills the LEFT colon. IMPRESSION: Improved SBO Electronically Signed   By: Staci Righter M.D.   On: 04/18/2015 09:20    Scheduled Meds: . famotidine (PEPCID) IV  20 mg Intravenous Q12H  . methylPREDNISolone (SOLU-MEDROL) injection  40 mg Intravenous Daily  . metronidazole  500 mg Intravenous 3 times per day   Continuous Infusions: . dextrose 5 % and 0.45 % NaCl with KCl 10 mEq/L 125 mL/hr at 04/18/15 2123   Antibiotics  Given (last 72 hours)    Date/Time Action Medication Dose Rate   04/17/15 0953 Given   piperacillin-tazobactam (ZOSYN) IVPB 3.375 g 3.375 g 100 mL/hr   04/18/15 1458 Given   metroNIDAZOLE (FLAGYL) IVPB 500 mg 500 mg 100 mL/hr   04/18/15 2123 Given   metroNIDAZOLE (FLAGYL) IVPB 500 mg 500 mg 100 mL/hr   04/19/15 0602 Given   metroNIDAZOLE (FLAGYL) IVPB 500 mg 500 mg 100 mL/hr      Principal Problem:   Exacerbation of Crohn's disease of small intestine ([place]) Active Problems:   Crohn's colitis ([place])   SBO (small bowel obstruction) ([place])    Time spent: 25 min    [facility] Pager [phone]. If 7PM-7AM, please contact night-coverage at  [email or web address], password [email or web address] 04/19/2015, 11:19 AM  LOS: 2 days

## 2015-04-20 ENCOUNTER — Telehealth: Payer: Self-pay

## 2015-04-20 ENCOUNTER — Other Ambulatory Visit: Payer: Self-pay

## 2015-04-20 DIAGNOSIS — K50919 Crohn's disease, unspecified, with unspecified complications: Secondary | ICD-10-CM

## 2015-04-20 DIAGNOSIS — K50112 Crohn's disease of large intestine with intestinal obstruction: Secondary | ICD-10-CM

## 2015-04-20 LAB — GLUCOSE, CAPILLARY
GLUCOSE-CAPILLARY: 71 mg/dL (ref 65–99)
Glucose-Capillary: 88 mg/dL (ref 65–99)

## 2015-04-20 MED ORDER — FAMOTIDINE 20 MG PO TABS: 20.0000 mg | ORAL_TABLET | Freq: Two times a day (BID) | ORAL | Status: DC

## 2015-04-20 MED ORDER — FAMOTIDINE 20 MG PO TABS
20.0000 mg | ORAL_TABLET | Freq: Two times a day (BID) | ORAL | Status: DC
  Administered 2015-04-20: 20 mg via ORAL
  Filled 2015-04-20 (×2): qty 1

## 2015-04-20 MED ORDER — PREDNISONE 10 MG PO TABS: ORAL_TABLET | ORAL | Status: DC

## 2015-04-20 MED ORDER — PREDNISONE 20 MG PO TABS
20.0000 mg | ORAL_TABLET | Freq: Every day | ORAL | Status: DC
  Filled 2015-04-20: qty 1

## 2015-04-20 MED ORDER — PREDNISONE 20 MG PO TABS
40.0000 mg | ORAL_TABLET | Freq: Every day | ORAL | Status: DC
  Filled 2015-04-20: qty 2

## 2015-04-20 NOTE — Progress Notes (Signed)
PHARMACIST - PHYSICIAN COMMUNICATION  CONCERNING: IV to Oral Route Change Policy  RECOMMENDATION: This patient is receiving famotidine by the intravenous route.  Based on criteria approved by the Pharmacy and Therapeutics Committee, the intravenous medication(s) is/are being converted to the equivalent oral dose form(s).   DESCRIPTION: These criteria include:  The patient is eating (either orally or via tube) and/or has been taking other orally administered medications for a least 24 hours  The patient has no evidence of active gastrointestinal bleeding or impaired GI absorption (gastrectomy, short bowel, patient on TNA or NPO).  If you have questions about this conversion, please contact the Pharmacy Department  []   [phone] )  [facility] []   [phone] )  [facility] []   [phone] )  [facility] []   [phone] )  [facility] [x]   [phone] )  [place], PharmD candidate 04/20/2015 10:24 AM

## 2015-04-20 NOTE — Telephone Encounter (Signed)
Pt information printed off for pt to have Ancer ADA lab test done. Called pt to discuss testing and received message that voicemail is full, unable to leave message. [facility] draw location and insurance and payment information printed off to also give pt. Not sure if test will be covered by her insurance. Pt will be instructed to contact her insurance regarding coverage. Will try again to contact pt.

## 2015-04-20 NOTE — Telephone Encounter (Signed)
Spoke with pt and she is aware. States she will come by office in the am to pick up papers for test and checking on insurance coverage for test.

## 2015-04-20 NOTE — Progress Notes (Signed)
Patient ID: Carla Clark, female   DOB: Mar 28, 1985, 28 y.o.   MRN: [id number]    Progress Note   Subjective  Feels good- tolerating  full liquids without difficulty    Objective   Vital signs in last 24 hours: Temp:  [98.1 F (36.7 C)-98.5 F (36.9 C)] 98.1 F (36.7 C) (03/27 0550) Pulse Rate:  [63-87] 63 (03/27 0550) Resp:  [16-18] 18 (03/27 0550) BP: (99-112)/(56-65) 100/63 mmHg (03/27 0550) SpO2:  [100 %] 100 % (03/27 0550) Last BM Date: 04/16/15 General:     Young white female in NAD Heart:  Regular rate and rhythm; no murmurs Lungs: Respirations even and unlabored, lungs CTA bilaterally Abdomen:  Soft, mildly tender and nondistended. Normal bowel sounds. Extremities:  Without edema. Neurologic:  Alert and oriented,  grossly normal neurologically. Psych:  Cooperative. Normal mood and affect.  Intake/Output from previous day: 03/26 0701 - 03/27 0700 In: 2160 [P.O.:660; I.V.:1500] Out: -  Intake/Output this shift:    Lab Results:  Recent Labs  04/18/15 0433  WBC 12.6*  HGB 12.2  HCT 36.7  PLT 329   BMET  Recent Labs  04/18/15 0433  NA 141  K 4.3  CL 110  CO2 24  GLUCOSE 108*  BUN 9  CREATININE 0.74  CALCIUM 9.3   LFT No results for input(s): PROT, ALBUMIN, AST, ALT, ALKPHOS, BILITOT, BILIDIR, IBILI in the last 72 hours. PT/INR No results for input(s): LABPROT, INR in the last 72 hours.  Studies/Results: No results found.     Assessment / Plan:    #1 30 yo female with Crohns enteritis with exacerbation and partial proximal SBO Much improved with IV solumedrol  OK to discharge home today  Discussed soft,low residue diet with her Home on prednisone 40 mg po qm x 2 weeks ,then 30 mg po daily x one week then decrease by 5 mg weekly Will arrange office appt in 2-3 weeks with Dr Hilarie Fredrickson Pt will come to office lab tomorrow for Humira Ab/concentration labs Continue same Humira dosing for now    Principal Problem:   Exacerbation of Crohn's  disease of small intestine ([place]) Active Problems:   Crohn's colitis ([place])   SBO (small bowel obstruction) ([place])     LOS: 3 days   Amy Esterwood  04/20/2015, 8:41 AM   Attending physician's note   I have taken an interval history, reviewed the chart and examined the patient. I agree with the Advanced Practitioner's note, impression and recommendations. Overall doing better, passing flatus and had a BM yesterday. Switched to PO prednisone and Ok to DC home on prednisone taper. Follow up in office to check for Adalimumab Ab and level and if has increased Ab level, may have to consider switching to a different biologic.   Damaris Hippo, MD [phone] Mon-Fri 8a-5p [phone] after 5p, weekends, holidays

## 2015-04-20 NOTE — Telephone Encounter (Signed)
Pt scheduled to see Dr. Hilarie Fredrickson 05/13/15@11 :15am. Letter mailed to pt.

## 2015-04-20 NOTE — Telephone Encounter (Signed)
-----   Message from Alfredia Ferguson, PA-C sent at 04/19/2015  9:40 AM EDT ----- Regarding: [facility] labs Pt has been hospitalized with partial SBO secondary to Crohns- On HUmira... Next dose due Wednesday.. Need ADA ab/concentration done  ( [facility] )  She will come to lab tomorrow or Tuesday .. thanks

## 2015-04-20 NOTE — Telephone Encounter (Signed)
-----   Message from Jerene Bears, MD sent at 04/20/2015  2:39 PM EDT ----- Regarding: RE: office appt [facility] 4/19  ----- Message -----    From: Algernon Huxley, RN    Sent: 04/20/2015   2:14 PM      To: Jerene Bears, MD Subject: FW: office appt                                Dr. Hilarie Fredrickson,  Please advise where you would like to schedule this pt, you do not have any openings.  Thanks, Vaughan Basta  ----- Message -----    From: Alfredia Ferguson, PA-C    Sent: 04/20/2015   9:00 AM      To: Algernon Huxley, RN, Jerene Bears, MD Subject: office appt                                    Pt being discharged from hospital today - Crohns and partial SBO- please get her an appt with Dr Levie Heritage in 2-3 weeks

## 2015-04-20 NOTE — Discharge Summary (Signed)
Physician Discharge Summary  BONA HUBBARD KWI:[id number] DOB: May 22, 1985 DOA: 04/16/2015  PCP: Penni Homans, MD  Admit date: 04/16/2015 Discharge date: 04/20/2015  Time spent: 35 minutes  Recommendations for Outpatient Follow-up:  Adalimumab Ab and level and if has increased Ab level, may have to consider switching to a different biologic.    Discharge Diagnoses:  Principal Problem:   Exacerbation of Crohn's disease of small intestine ([place]) Active Problems:   Crohn's colitis ([place])   SBO (small bowel obstruction) ([place])   Discharge Condition: improved  Diet recommendation: low residue  Filed Weights   04/16/15 1712 04/17/15 0330  Weight: 61.236 kg (135 lb) 59.467 kg (131 lb 1.6 oz)    History of present illness:  Carla Clark is a 30 y.o. female with no history of Crohn's disease on Humira presents to the ER because of abdominal pain since yesterday morning. Patient's abdominal pain is mostly periumbilical and right lower quadrant. Had one episode of diarrhea followed by nausea and vomiting in the evening. Denies any fever or chills. CT abdomen and pelvis shows inflammation of the terminal ileum with possible small bowel obstruction due to strictures and inflammatory changes. On call surgeon Dr. Redmond Pulling was consulted by the ER PA who at this time is recommended hospitalization admission with gastroenterologist consult and to reconsult surgery if required.. Dr. Ardis Hughs was consulted by the ER physician. Patient has received Solu-Medrol antibiotics and pain medications in the ER following which patient's pain is improved.  Hospital Course:  Acute exacerbation of Crohn's disease with SBO- -advance diet -IV steroids- change to PO and slow taper per GI -IV flagyl- spoke with GI- no need to start PO at d/c -GI consult  History of iron deficiency anemia  - Patient's hemoglobin appears to be stable  Procedures:    Consultations:  GI  Discharge Exam: Filed Vitals:   04/19/15  2102 04/20/15 0550  BP: 99/56 100/63  Pulse: 70 63  Temp: 98.5 F (36.9 C) 98.1 F (36.7 C)  Resp: 18 18    General: awake, NAD   Discharge Instructions   Discharge Instructions    Discharge instructions    Complete by:  As directed   Take pepcid or other acid blocker while on prednisone Low residue diet     Increase activity slowly    Complete by:  As directed           Current Discharge Medication List    START taking these medications   Details  famotidine (PEPCID) 20 MG tablet Take 1 tablet (20 mg total) by mouth 2 (two) times daily. Qty: 60 tablet, Refills: 1    predniSONE (DELTASONE) 10 MG tablet 40 mg po am x 2 weeks ,then 30 mg po daily x one week then decrease by 5 mg weekly Qty: 135 tablet, Refills: 0      CONTINUE these medications which have NOT CHANGED   Details  Cetirizine HCl (ZYRTEC ALLERGY PO) Take by mouth 2 (two) times daily.   Associated Diagnoses: Other iron deficiency anemias    desogestrel-ethinyl estradiol (VIORELE) 0.15-0.02/0.01 MG (21/5) tablet Take 1 tablet by mouth daily. Qty: 84 tablet, Refills: 0    diphenhydrAMINE (BENADRYL) 25 mg capsule Take 25 mg by mouth at bedtime as needed for allergies.    Associated Diagnoses: Other iron deficiency anemias    HUMIRA PEN 40 MG/0.8ML PNKT Inject 1 pen into the skin every 14 (fourteen) days. Qty: 2 each, Refills: 10    Probiotic Product (MISC INTESTINAL FLORA  REGULAT) CHEW Chew by mouth every other day. Digestive Health probiotic gummies by [employer]       No Known Allergies    The results of significant diagnostics from this hospitalization (including imaging, microbiology, ancillary and laboratory) are listed below for reference.    Significant Diagnostic Studies: Dg Abd 1 View  04/18/2015  CLINICAL DATA:  Continued surveillance for Crohn's disease with bowel obstruction. EXAM: ABDOMEN - 1 VIEW COMPARISON:  CT abdomen and pelvis 04/16/2015. FINDINGS: Apparent bowel wall thickening in  the RIGHT colon could represent manifestation of Crohn's disease ([place]). The small bowel distention noted on prior CT has improved. No features of bowel obstruction on current radiograph. No visible free air on supine radiograph. Enteric contrast fills the LEFT colon. IMPRESSION: Improved SBO Electronically Signed   By: Staci Righter M.D.   On: 04/18/2015 09:20   Ct Abdomen Pelvis W Contrast  04/16/2015  CLINICAL DATA:  Supraumbilical pain with nausea and vomiting for 16 hours. History of Crohn disease. EXAM: CT ABDOMEN AND PELVIS WITH CONTRAST TECHNIQUE: Multidetector CT imaging of the abdomen and pelvis was performed using the standard protocol following bolus administration of intravenous contrast. CONTRAST:  143m ISOVUE-300 IOPAMIDOL (ISOVUE-300) INJECTION 61% COMPARISON:  09/05/2012 FINDINGS: Lung bases are clear. The liver, spleen, pancreas, adrenal glands, kidneys, abdominal aorta, inferior vena cava, and retroperitoneal lymph nodes are unremarkable. Stomach appears normal. Small bowel are dilated and fluid-filled. Decompression of distal small bowel. Transition zone appears to be in the right lower quadrant. The wall of the terminal ileum is diffusely thickened and there is infiltration in the mesenteric fat of the right lower quadrant. Obstruction is likely due to inflammatory strictures. Infiltration in the right lower quadrant fat may indicate inflammation or fistula. No discrete abscess is identified. Colon is mostly decompressed. No free air or free fluid in the abdomen. Pelvis: The appendix is normal. There is free fluid in the pelvis, likely reactive. Involuting cyst in the right ovary. Uterus and ovaries are not enlarged. Bladder wall is not thickened. No destructive bone lesions. IMPRESSION: Wall thickening in the terminal ileum consistent with inflammatory changes due to known Crohn's disease. There is proximal small bowel obstruction, likely due to inflammatory strictures. Infiltration in the  right lower quadrant fat may indicate fistula or inflammatory reaction. Small amount of free fluid in the pelvis is likely reactive. No discrete abscess identified. Electronically Signed   By: WLucienne CapersM.D.   On: 04/16/2015 22:46    Microbiology: No results found for this or any previous visit (from the past 240 hour(s)).   Labs: Basic Metabolic Panel:  Recent Labs Lab 04/16/15 1910 04/17/15 0618 04/18/15 0433  NA 138 137 141  K 4.2 4.3 4.3  CL 104 109 110  CO2 23 19* 24  GLUCOSE 84 114* 108*  BUN 9 11 9   CREATININE 0.73 0.69 0.74  CALCIUM 9.9 9.1 9.3   Liver Function Tests:  Recent Labs Lab 04/16/15 1910 04/17/15 0618  AST 19 16  ALT 23 20  ALKPHOS 59 43  BILITOT 0.9 1.1  PROT 8.2* 7.0  ALBUMIN 4.8 3.8    Recent Labs Lab 04/16/15 1910  LIPASE 42   No results for input(s): AMMONIA in the last 168 hours. CBC:  Recent Labs Lab 04/16/15 1910 04/17/15 0618 04/18/15 0433  WBC 16.4* 11.1* 12.6*  NEUTROABS 14.1* 10.3*  --   HGB 15.1* 13.0 12.2  HCT 45.2 38.1 36.7  MCV 86.6 84.3 86.6  PLT 304 302 329  Cardiac Enzymes: No results for input(s): CKTOTAL, CKMB, CKMBINDEX, TROPONINI in the last 168 hours. BNP: BNP (last 3 results) No results for input(s): BNP in the last 8760 hours.  ProBNP (last 3 results) No results for input(s): PROBNP in the last 8760 hours.  CBG:  Recent Labs Lab 04/18/15 0506 04/18/15 0751 04/19/15 0552 04/19/15 0751 04/20/15 0753  GLUCAP 83 94 84 88 88       Signed:  Marvelyn Bouchillon U Nazim Kadlec DO.  [facility] 04/20/2015, 11:22 AM

## 2015-04-21 ENCOUNTER — Other Ambulatory Visit: Payer: Self-pay | Admitting: Internal Medicine

## 2015-04-22 LAB — PROMETHEUS-MAIL

## 2015-04-27 ENCOUNTER — Telehealth: Payer: Self-pay

## 2015-04-27 NOTE — Telephone Encounter (Signed)
Attempted to call pt but received message that mailbox if full and cannot accept messages at this time. Will try again later.

## 2015-04-27 NOTE — Telephone Encounter (Signed)
Per Dr. Nira Retort ADA labs show no evidence of antibodies to Humira. Recent hospital stay for acute crohns. Per Dr. Hilarie Fredrickson pt needs to increase her Humira 36m every 7 days for 2 mths and keep scheduled OV.

## 2015-04-28 MED ORDER — ADALIMUMAB 40 MG/0.8ML ~~LOC~~ PSKT: 40.0000 mg | PREFILLED_SYRINGE | SUBCUTANEOUS | Status: DC

## 2015-04-28 NOTE — Telephone Encounter (Signed)
Spoke with pt and she is aware, pt has OV scheduled already. New script for Humira sent to pharmacy.

## 2015-04-30 ENCOUNTER — Encounter: Payer: Self-pay | Admitting: *Deleted

## 2015-05-05 ENCOUNTER — Encounter: Payer: Self-pay | Admitting: Internal Medicine

## 2015-05-11 ENCOUNTER — Other Ambulatory Visit (HOSPITAL_BASED_OUTPATIENT_CLINIC_OR_DEPARTMENT_OTHER): Payer: Commercial Managed Care - PPO

## 2015-05-11 ENCOUNTER — Ambulatory Visit (HOSPITAL_BASED_OUTPATIENT_CLINIC_OR_DEPARTMENT_OTHER): Payer: Commercial Managed Care - PPO | Admitting: Family

## 2015-05-11 ENCOUNTER — Encounter: Payer: Self-pay | Admitting: Family

## 2015-05-11 VITALS — BP 122/64 | HR 102 | Temp 98.4°F | Resp 16 | Ht 63.0 in | Wt 134.0 lb

## 2015-05-11 DIAGNOSIS — K5090 Crohn's disease, unspecified, without complications: Secondary | ICD-10-CM

## 2015-05-11 DIAGNOSIS — D508 Other iron deficiency anemias: Secondary | ICD-10-CM | POA: Diagnosis not present

## 2015-05-11 LAB — CBC WITH DIFFERENTIAL (CANCER CENTER ONLY)
BASO#: 0 10*3/uL (ref 0.0–0.2)
BASO%: 0.1 % (ref 0.0–2.0)
EOS%: 0.2 % (ref 0.0–7.0)
Eosinophils Absolute: 0 10*3/uL (ref 0.0–0.5)
HEMATOCRIT: 40.3 % (ref 34.8–46.6)
HEMOGLOBIN: 13.4 g/dL (ref 11.6–15.9)
LYMPH#: 1.2 10*3/uL (ref 0.9–3.3)
LYMPH%: 10.2 % — ABNORMAL LOW (ref 14.0–48.0)
MCH: 29.4 pg (ref 26.0–34.0)
MCHC: 33.3 g/dL (ref 32.0–36.0)
MCV: 88 fL (ref 81–101)
MONO#: 0.3 10*3/uL (ref 0.1–0.9)
MONO%: 2.3 % (ref 0.0–13.0)
NEUT%: 87.2 % — ABNORMAL HIGH (ref 39.6–80.0)
NEUTROS ABS: 10.5 10*3/uL — AB (ref 1.5–6.5)
Platelets: 277 10*3/uL (ref 145–400)
RBC: 4.56 10*6/uL (ref 3.70–5.32)
RDW: 13.6 % (ref 11.1–15.7)
WBC: 12 10*3/uL — ABNORMAL HIGH (ref 3.9–10.0)

## 2015-05-11 NOTE — Progress Notes (Signed)
Hematology and Oncology Follow Up Visit  Carla Clark [id number] [birth year] 29 y.o. 05/11/2015   Principle Diagnosis:  Iron deficiency anemia  Current Therapy:   IV iron as indicated    Interim History:  Carla Clark is here today for a follow-up. She had a chron's flare in March and spent several days in the hospital. She is currently on a Prednisone taper and feeling much better. She is eating a healthy diet and is starting to re-introduce lettuce. She denies having constipation, diarrhea or blood in her stool.  Her last dose of Feraheme was in January 2015 and she responded nicely. In January her iron saturation was 22% with a ferritin of 40.  She is taking Pepcid for acid reflux.  No fever, chills, n/v, cough, rash, dizziness, SOB, chest pain, palpitations or abdominal pain. Her cycles continue to be normal and not heavy.  No swelling, tenderness, numbness or tingling in her extremities. No c/o joint aches or pains.  She is staying hydrated. Her weight is stable.  She is now working at [employer] and enjoying her job. She and her husband are looking for a house to buy which is quite exciting for them.   Medications:    Medication List       This list is accurate as of: 05/11/15  1:49 PM.  Always use your most recent med list.               desogestrel-ethinyl estradiol 0.15-0.02/0.01 MG (21/5) tablet  Commonly known as:  VIORELE  Take 1 tablet by mouth daily.     diphenhydrAMINE 25 mg capsule  Commonly known as:  BENADRYL  Take 25 mg by mouth at bedtime as needed for allergies.     famotidine 20 MG tablet  Commonly known as:  PEPCID  Take 1 tablet (20 mg total) by mouth 2 (two) times daily.     HUMIRA PEN 40 MG/0.8ML Pnkt  Generic drug:  Adalimumab  Inject 1 pen into the skin every 14 (fourteen) days.     Adalimumab 40 MG/0.8ML Pskt  Commonly known as:  HUMIRA  Inject 0.8 mLs (40 mg total) into the skin every 7 (seven) days.     Misc Intestinal  Flora Regulat Chew  Chew by mouth every other day. Digestive Health probiotic gummies by [employer]     predniSONE 10 MG tablet  Commonly known as:  DELTASONE  40 mg po am x 2 weeks ,then 30 mg po daily x one week then decrease by 5 mg weekly     ZYRTEC ALLERGY PO  Take by mouth 2 (two) times daily.        Allergies: No Known Allergies  Past Medical History, Surgical history, Social history, and Family History were reviewed and updated.  Review of Systems: All other 10 point review of systems is negative.   Physical Exam:  height is 5' 3"  (1.6 m) and weight is 134 lb (60.782 kg). Her oral temperature is 98.4 F (36.9 C). Her blood pressure is 122/64 and her pulse is 102. Her respiration is 16.   Wt Readings from Last 3 Encounters:  05/11/15 134 lb (60.782 kg)  04/17/15 131 lb 1.6 oz (59.467 kg)  01/20/15 137 lb (62.143 kg)    Ocular: Sclerae unicteric, pupils equal, round and reactive to light Ear-nose-throat: Oropharynx clear, dentition fair Lymphatic: No cervical supraclavicular or axillary adenopathy Lungs no rales or rhonchi, good excursion bilaterally Heart regular rate and rhythm, no murmur appreciated Abd  soft, nontender, positive bowel sounds, no liver or spleen tip palpated on exam, no fluid wave MSK no focal spinal tenderness, no joint edema Neuro: non-focal, well-oriented, appropriate affect Breasts: Deferred  Lab Results  Component Value Date   WBC 12.0* 05/11/2015   HGB 13.4 05/11/2015   HCT 40.3 05/11/2015   MCV 88 05/11/2015   PLT 277 05/11/2015   Lab Results  Component Value Date   FERRITIN 40 01/20/2015   IRON 74 01/20/2015   TIBC 333 01/20/2015   UIBC 258 01/20/2015   IRONPCTSAT 22 01/20/2015   Lab Results  Component Value Date   RETICCTPCT 0.3* 01/20/2015   RBC 4.56 05/11/2015   RETICCTABS 13.7* 01/20/2015   No results found for: KPAFRELGTCHN, LAMBDASER, KAPLAMBRATIO No results found for: IGGSERUM, IGA, IGMSERUM No results found for:  [name], SPEI   Chemistry      Component Value Date/Time   NA 141 04/18/2015 0433   K 4.3 04/18/2015 0433   CL 110 04/18/2015 0433   CO2 24 04/18/2015 0433   BUN 9 04/18/2015 0433   CREATININE 0.74 04/18/2015 0433   CREATININE 0.62 08/20/2012 1139      Component Value Date/Time   CALCIUM 9.3 04/18/2015 0433   ALKPHOS 43 04/17/2015 0618   AST 16 04/17/2015 0618   ALT 20 04/17/2015 0618   BILITOT 1.1 04/17/2015 0618     Impression and Plan: Carla Clark is a 30 year old white female with iron deficiency anemia secondary to Crohn's disease. She did have a chron's flare in march and spent several days in the hospital. She is now feeling much better and on a Prednisone taper. She is asymptomatic at this time.  Her last dose of Feraheme was in January 2015. We will see what her iron studies show and bring her in later this week for an infusion if needed.  We will plan to see her back in 4 months for labs and follow-up.  She will contact our office with any questions or concerns. We can certainly see her sooner if need be.   [name], NP 4/17/20171:49 PM

## 2015-05-12 LAB — IRON AND TIBC
%SAT: 32 % (ref 21–57)
IRON: 100 ug/dL (ref 41–142)
TIBC: 310 ug/dL (ref 236–444)
UIBC: 210 ug/dL (ref 120–384)

## 2015-05-12 LAB — RETICULOCYTES: Reticulocyte Count: 0.7 % (ref 0.6–2.6)

## 2015-05-12 LAB — FERRITIN: FERRITIN: 22 ng/mL (ref 9–269)

## 2015-05-13 ENCOUNTER — Encounter: Payer: Self-pay | Admitting: Internal Medicine

## 2015-05-13 ENCOUNTER — Ambulatory Visit (INDEPENDENT_AMBULATORY_CARE_PROVIDER_SITE_OTHER): Payer: Commercial Managed Care - PPO | Admitting: Internal Medicine

## 2015-05-13 VITALS — BP 116/66 | HR 72 | Ht 63.5 in | Wt 134.0 lb

## 2015-05-13 DIAGNOSIS — Z79899 Other long term (current) drug therapy: Secondary | ICD-10-CM

## 2015-05-13 DIAGNOSIS — D509 Iron deficiency anemia, unspecified: Secondary | ICD-10-CM | POA: Diagnosis not present

## 2015-05-13 DIAGNOSIS — K50019 Crohn's disease of small intestine with unspecified complications: Secondary | ICD-10-CM | POA: Diagnosis not present

## 2015-05-13 MED ORDER — PREDNISONE 10 MG PO TABS: ORAL_TABLET | ORAL | Status: DC

## 2015-05-13 NOTE — Patient Instructions (Addendum)
Take your Humira once weekly x 8 weeks, then start taking once every other week again thereafter.  Complete your prednisone taper.  Follow a low residue diet (which we have given you a handout on today).  Follow up with Dr Hilarie Fredrickson on Wednesday 07/22/15 @ 1:45 pm.  We have given you a prednisone prescription to take with you on your trip in case needed.  If you are age 28 or older, your body mass index should be between 23-30. Your Body mass index is 23.36 kg/(m^2). If this is out of the aforementioned range listed, please consider follow up with your Primary Care Provider.  If you are age 4 or younger, your body mass index should be between 19-25. Your Body mass index is 23.36 kg/(m^2). If this is out of the aformentioned range listed, please consider follow up with your Primary Care Provider.

## 2015-05-13 NOTE — Progress Notes (Signed)
Subjective:    Patient ID: Carla Clark, female    DOB: 10-15-1985, 30 y.o.   MRN: [id number]  HPI Carla Clark is a 30 year old female with a history of Crohn's ileitis diagnosed in 2013 who is been managed on Humira, history of iron deficiency anemia who is here for follow-up. She was recently hospitalized in March 2017 with active Crohn's ileitis with small bowel obstruction. She reports that fairly suddenly she developed mid abdominal pain with nausea and vomiting. She was hospitalized at CT scan showed wall thickening in the TI consistent with inflammatory Crohn's disease with proximal small bowel obstruction. There was infiltration in the right lower quadrant fat which may suggest fistula or inflammatory reaction. She was treated with IV Solu-Medrol, pain control and bowel rest. Shortly after discharge Humira antibody was checked and negative. She has been on Humira 40 mg every 7 days since discharge along with a prednisone taper beginning at 40 mg daily tapering by 5 mg every week.  Today she reports she is feeling significantly better. Her abdominal pain is resolved. She's been following a low residue diet. She has not had fever or chills. Bowel movements have returned mostly to normal for her. Comments can be loose but she denies blood in her stool or melena. She is currently on prednisone 25 mg daily.  Of note she admitted to February having been a very busy month for her and she missed 2 consecutive doses of Humira, thus she went 6 weeks without therapy.  She does have an upcoming trip to [place] from June 7 to June 17.   Review of Systems As per history of present illness, otherwise negative  Current Medications, Allergies, Past Medical History, Past Surgical History, Family History and Social History were reviewed in [employer] record.      Objective:   Physical Exam BP 116/66 mmHg  Pulse 72  Ht 5' 3.5" (1.613 m)  Wt 134 lb (60.782 kg)  BMI 23.36  kg/m2  LMP 03/25/2015 (Approximate) Constitutional: Well-developed and well-nourished. No distress. HEENT: Normocephalic and atraumatic. Oropharynx is clear and moist. No oropharyngeal exudate. Conjunctivae are normal.  No scleral icterus. Neck: Neck supple. Trachea midline. Cardiovascular: Normal rate, regular rhythm and intact distal pulses. No M/R/G Pulmonary/chest: Effort normal and breath sounds normal. No wheezing, rales or rhonchi. Abdominal: Soft, Right lower quadrant tenderness without rebound or guarding, nondistended. Bowel sounds active throughout. There are no masses palpable. No hepatosplenomegaly. Extremities: no clubbing, cyanosis, or edema Lymphadenopathy: No cervical adenopathy noted. Neurological: Alert and oriented to person place and time. Skin: Skin is warm and dry. No rashes noted. Psychiatric: Normal mood and affect. Behavior is normal.  CBC    Component Value Date/Time   WBC 12.0* 05/11/2015 1334   WBC 12.6* 04/18/2015 0433   RBC 4.56 05/11/2015 1334   RBC 4.24 04/18/2015 0433   RBC 4.56 01/20/2015 1011   HGB 13.4 05/11/2015 1334   HGB 12.2 04/18/2015 0433   HCT 40.3 05/11/2015 1334   HCT 36.7 04/18/2015 0433   PLT 277 05/11/2015 1334   PLT 329 04/18/2015 0433   MCV 88 05/11/2015 1334   MCV 86.6 04/18/2015 0433   MCH 29.4 05/11/2015 1334   MCH 28.8 04/18/2015 0433   MCHC 33.3 05/11/2015 1334   MCHC 33.2 04/18/2015 0433   RDW 13.6 05/11/2015 1334   RDW 13.3 04/18/2015 0433   LYMPHSABS 1.2 05/11/2015 1334   LYMPHSABS 0.7 04/17/2015 0618   MONOABS 0.1 04/17/2015 0618  EOSABS 0.0 05/11/2015 1334   EOSABS 0.0 04/17/2015 0618   BASOSABS 0.0 05/11/2015 1334   BASOSABS 0.0 04/17/2015 0618    CMP     Component Value Date/Time   NA 141 04/18/2015 0433   K 4.3 04/18/2015 0433   CL 110 04/18/2015 0433   CO2 24 04/18/2015 0433   GLUCOSE 108* 04/18/2015 0433   BUN 9 04/18/2015 0433   CREATININE 0.74 04/18/2015 0433   CREATININE 0.62 08/20/2012 1139     CALCIUM 9.3 04/18/2015 0433   PROT 7.0 04/17/2015 0618   ALBUMIN 3.8 04/17/2015 0618   AST 16 04/17/2015 0618   ALT 20 04/17/2015 0618   ALKPHOS 43 04/17/2015 0618   BILITOT 1.1 04/17/2015 0618   GFRNONAA >60 04/18/2015 0433   GFRAA >60 04/18/2015 0433    Iron/TIBC/Ferritin/ %Sat    Component Value Date/Time   IRON 100 05/11/2015 1334   IRON 56 12/03/2012 0953   TIBC 310 05/11/2015 1334   FERRITIN 22 05/11/2015 1334   FERRITIN 7.9* 12/03/2012 0953   IRONPCTSAT 32 05/11/2015 1334   IRONPCTSAT 13.3* 12/03/2012 0953     Clinical Course prior to recent hospitalization and obstruction Diagnosed with Crohn's ileitis in 2013.  11/2011 CRP 56, positive pASCA of 64 (normal 0-50), negative ACCA/ALCA/AMCA/pANCA 10/2011 CT showed ileitis.  11/2011 Colonoscopy. ( Dr Sharlett Iles) with normal colon but MD unable to intubate the TI.  11/2011 CT enterography: improved ileitis, ? Developing entero-enteric fistula.  08/2012 CT enterography: terminal ileitis, 2 cm abscess at TI, suggestion of fistula, inflammatory stricture 10 cm proximal to IC valve, short segment of inflammation 25 cm from IC valve c/w second area of inflammation/skip lesion. Initially treated with just Budesonide but transitioned to Humira in 12/2012 and Budesonide weaned off. This occurred after second opinion with Dr Candis Shine at [facility] and Dr Hilarie Fredrickson. Getting Humira every 14 days.     Assessment & Plan:  30 year old female with a history of Crohn's ileitis diagnosed in 2013 who is been managed on Humira, history of iron deficiency anemia who is here for follow-up.   1. Crohn's ileitis -- She has been improving but remains on steroid taper. She will continue to taper prednisone by 5 mg every 7 days. She is currently on 25 mg daily and will change to 20 mg in 2 days. I would like her to continue Humira 40 mg weekly for a total of 8 weeks. If at the end of this schedule she is improved we will return the dose to  every 14 days. I have advised a low residue diet. Recent labs have been reassuring though she's had a mild leukocytosis felt secondary to steroid administration. No evidence of liver or kidney dysfunction. --I will give her a prescription for prednisone 40 mg to take with her on her trip to [place] should she develop signs or symptoms of active flare while there. --Return to the office in late June or early July for follow-up.  2. IDA -- following with hematology. Recent iron studies normal. I recommended a daily multivitamin containing iron.  25 minutes spent with the patient today. Greater than 50% was spent in counseling and coordination of care with the patient

## 2015-05-14 ENCOUNTER — Telehealth: Payer: Self-pay | Admitting: Internal Medicine

## 2015-05-14 NOTE — Telephone Encounter (Signed)
See additional phone note.

## 2015-05-14 NOTE — Telephone Encounter (Signed)
Order given for Humira pens instead of prefilled syringes.

## 2015-05-21 ENCOUNTER — Other Ambulatory Visit: Payer: Commercial Managed Care - PPO

## 2015-05-21 ENCOUNTER — Ambulatory Visit: Payer: Commercial Managed Care - PPO | Admitting: Family

## 2015-07-14 ENCOUNTER — Encounter: Payer: Self-pay | Admitting: *Deleted

## 2015-07-21 ENCOUNTER — Ambulatory Visit (INDEPENDENT_AMBULATORY_CARE_PROVIDER_SITE_OTHER): Payer: Commercial Managed Care - PPO | Admitting: Family Medicine

## 2015-07-21 ENCOUNTER — Encounter: Payer: Self-pay | Admitting: Family Medicine

## 2015-07-21 VITALS — BP 102/68 | HR 72 | Temp 98.5°F | Ht 64.0 in | Wt 138.2 lb

## 2015-07-21 DIAGNOSIS — K50819 Crohn's disease of both small and large intestine with unspecified complications: Secondary | ICD-10-CM

## 2015-07-21 DIAGNOSIS — N63 Unspecified lump in unspecified breast: Secondary | ICD-10-CM

## 2015-07-21 DIAGNOSIS — D649 Anemia, unspecified: Secondary | ICD-10-CM | POA: Diagnosis not present

## 2015-07-21 DIAGNOSIS — Z3169 Encounter for other general counseling and advice on procreation: Secondary | ICD-10-CM

## 2015-07-21 DIAGNOSIS — D508 Other iron deficiency anemias: Secondary | ICD-10-CM

## 2015-07-21 DIAGNOSIS — Z0000 Encounter for general adult medical examination without abnormal findings: Secondary | ICD-10-CM | POA: Diagnosis not present

## 2015-07-21 DIAGNOSIS — Z124 Encounter for screening for malignant neoplasm of cervix: Secondary | ICD-10-CM

## 2015-07-21 NOTE — Assessment & Plan Note (Addendum)
Patient encouraged to maintain heart healthy diet, regular exercise, adequate sleep. Consider daily probiotics. Take medications as prescribed. Given and reviewed copy of ACP documents from [place] Secretary of State and encouraged to complete and return 

## 2015-07-21 NOTE — Progress Notes (Signed)
Pre visit review using our clinic review tool, if applicable. No additional management support is needed unless otherwise documented below in the visit note. 

## 2015-07-21 NOTE — Assessment & Plan Note (Addendum)
Mild abdominal discomfort but no significant flares. Has an appointment with gastroenterology this week.

## 2015-07-21 NOTE — Assessment & Plan Note (Signed)
Will refer to Dr Sabra Heck for consultation for preconception and pap

## 2015-07-21 NOTE — Patient Instructions (Signed)
[employer] 10 strain probiotic cap 1 daily [email or web address]  Preventive Care for Adults, Female A healthy lifestyle and preventive care can promote health and wellness. Preventive health guidelines for women include the following key practices.  A routine yearly physical is a good way to check with your health care provider about your health and preventive screening. It is a chance to share any concerns and updates on your health and to receive a thorough exam.  Visit your dentist for a routine exam and preventive care every 6 months. Brush your teeth twice a day and floss once a day. Good oral hygiene prevents tooth decay and gum disease.  The frequency of eye exams is based on your age, health, family medical history, use of contact lenses, and other factors. Follow your health care provider's recommendations for frequency of eye exams.  Eat a healthy diet. Foods like vegetables, fruits, whole grains, low-fat dairy products, and lean protein foods contain the nutrients you need without too many calories. Decrease your intake of foods high in solid fats, added sugars, and salt. Eat the right amount of calories for you.Get information about a proper diet from your health care provider, if necessary.  Regular physical exercise is one of the most important things you can do for your health. Most adults should get at least 150 minutes of moderate-intensity exercise (any activity that increases your heart rate and causes you to sweat) each week. In addition, most adults need muscle-strengthening exercises on 2 or more days a week.  Maintain a healthy weight. The body mass index (BMI) is a screening tool to identify possible weight problems. It provides an estimate of body fat based on height and weight. Your health care provider can find your BMI and can help you achieve or maintain a healthy weight.For adults 20 years and older:  A BMI below 18.5 is considered underweight.  A BMI of 18.5 to 24.9  is normal.  A BMI of 25 to 29.9 is considered overweight.  A BMI of 30 and above is considered obese.  Maintain normal blood lipids and cholesterol levels by exercising and minimizing your intake of saturated fat. Eat a balanced diet with plenty of fruit and vegetables. Blood tests for lipids and cholesterol should begin at age 47 and be repeated every 5 years. If your lipid or cholesterol levels are high, you are over 50, or you are at high risk for heart disease, you may need your cholesterol levels checked more frequently.Ongoing high lipid and cholesterol levels should be treated with medicines if diet and exercise are not working.  If you smoke, find out from your health care provider how to quit. If you do not use tobacco, do not start.  Lung cancer screening is recommended for adults aged 71-80 years who are at high risk for developing lung cancer because of a history of smoking. A yearly low-dose CT scan of the lungs is recommended for people who have at least a 30-pack-year history of smoking and are a current smoker or have quit within the past 15 years. A pack year of smoking is smoking an average of 1 pack of cigarettes a day for 1 year (for example: 1 pack a day for 30 years or 2 packs a day for 15 years). Yearly screening should continue until the smoker has stopped smoking for at least 15 years. Yearly screening should be stopped for people who develop a health problem that would prevent them from having lung cancer treatment.  If  you are pregnant, do not drink alcohol. If you are breastfeeding, be very cautious about drinking alcohol. If you are not pregnant and choose to drink alcohol, do not have more than 1 drink per day. One drink is considered to be 12 ounces (355 mL) of beer, 5 ounces (148 mL) of wine, or 1.5 ounces (44 mL) of liquor.  Avoid use of street drugs. Do not share needles with anyone. Ask for help if you need support or instructions about stopping the use of  drugs.  High blood pressure causes heart disease and increases the risk of stroke. Your blood pressure should be checked at least every 1 to 2 years. Ongoing high blood pressure should be treated with medicines if weight loss and exercise do not work.  If you are 76-69 years old, ask your health care provider if you should take aspirin to prevent strokes.  Diabetes screening is done by taking a blood sample to check your blood glucose level after you have not eaten for a certain period of time (fasting). If you are not overweight and you do not have risk factors for diabetes, you should be screened once every 3 years starting at age 81. If you are overweight or obese and you are 33-107 years of age, you should be screened for diabetes every year as part of your cardiovascular risk assessment.  Breast cancer screening is essential preventive care for women. You should practice "breast self-awareness." This means understanding the normal appearance and feel of your breasts and may include breast self-examination. Any changes detected, no matter how small, should be reported to a health care provider. Women in their 13s and 30s should have a clinical breast exam (CBE) by a health care provider as part of a regular health exam every 1 to 3 years. After age 38, women should have a CBE every year. Starting at age 16, women should consider having a mammogram (breast X-ray test) every year. Women who have a family history of breast cancer should talk to their health care provider about genetic screening. Women at a high risk of breast cancer should talk to their health care providers about having an MRI and a mammogram every year.  Breast cancer gene (BRCA)-related cancer risk assessment is recommended for women who have family members with BRCA-related cancers. BRCA-related cancers include breast, ovarian, tubal, and peritoneal cancers. Having family members with these cancers may be associated with an increased  risk for harmful changes (mutations) in the breast cancer genes BRCA1 and BRCA2. Results of the assessment will determine the need for genetic counseling and BRCA1 and BRCA2 testing.  Your health care provider may recommend that you be screened regularly for cancer of the pelvic organs (ovaries, uterus, and vagina). This screening involves a pelvic examination, including checking for microscopic changes to the surface of your cervix (Pap test). You may be encouraged to have this screening done every 3 years, beginning at age 23.  For women ages 27-65, health care providers may recommend pelvic exams and Pap testing every 3 years, or they may recommend the Pap and pelvic exam, combined with testing for human papilloma virus (HPV), every 5 years. Some types of HPV increase your risk of cervical cancer. Testing for HPV may also be done on women of any age with unclear Pap test results.  Other health care providers may not recommend any screening for nonpregnant women who are considered low risk for pelvic cancer and who do not have symptoms. Ask your health  care provider if a screening pelvic exam is right for you.  If you have had past treatment for cervical cancer or a condition that could lead to cancer, you need Pap tests and screening for cancer for at least 20 years after your treatment. If Pap tests have been discontinued, your risk factors (such as having a new sexual partner) need to be reassessed to determine if screening should resume. Some women have medical problems that increase the chance of getting cervical cancer. In these cases, your health care provider may recommend more frequent screening and Pap tests.  Colorectal cancer can be detected and often prevented. Most routine colorectal cancer screening begins at the age of 29 years and continues through age 9 years. However, your health care provider may recommend screening at an earlier age if you have risk factors for colon cancer. On a  yearly basis, your health care provider may provide home test kits to check for hidden blood in the stool. Use of a small camera at the end of a tube, to directly examine the colon (sigmoidoscopy or colonoscopy), can detect the earliest forms of colorectal cancer. Talk to your health care provider about this at age 55, when routine screening begins. Direct exam of the colon should be repeated every 5-10 years through age 29 years, unless early forms of precancerous polyps or small growths are found.  People who are at an increased risk for hepatitis B should be screened for this virus. You are considered at high risk for hepatitis B if:  You were born in a country where hepatitis B occurs often. Talk with your health care provider about which countries are considered high risk.  Your parents were born in a high-risk country and you have not received a shot to protect against hepatitis B (hepatitis B vaccine).  You have HIV or AIDS.  You use needles to inject street drugs.  You live with, or have sex with, someone who has hepatitis B.  You get hemodialysis treatment.  You take certain medicines for conditions like cancer, organ transplantation, and autoimmune conditions.  Hepatitis C blood testing is recommended for all people born from [birth year] through 1965 and any individual with known risks for hepatitis C.  Practice safe sex. Use condoms and avoid high-risk sexual practices to reduce the spread of sexually transmitted infections (STIs). STIs include gonorrhea, chlamydia, syphilis, trichomonas, herpes, HPV, and human immunodeficiency virus (HIV). Herpes, HIV, and HPV are viral illnesses that have no cure. They can result in disability, cancer, and death.  You should be screened for sexually transmitted illnesses (STIs) including gonorrhea and chlamydia if:  You are sexually active and are younger than 24 years.  You are older than 24 years and your health care provider tells you that you are  at risk for this type of infection.  Your sexual activity has changed since you were last screened and you are at an increased risk for chlamydia or gonorrhea. Ask your health care provider if you are at risk.  If you are at risk of being infected with HIV, it is recommended that you take a prescription medicine daily to prevent HIV infection. This is called preexposure prophylaxis (PrEP). You are considered at risk if:  You are sexually active and do not regularly use condoms or know the HIV status of your partner(s).  You take drugs by injection.  You are sexually active with a partner who has HIV.  Talk with your health care provider about whether you  are at high risk of being infected with HIV. If you choose to begin PrEP, you should first be tested for HIV. You should then be tested every 3 months for as long as you are taking PrEP.  Osteoporosis is a disease in which the bones lose minerals and strength with aging. This can result in serious bone fractures or breaks. The risk of osteoporosis can be identified using a bone density scan. Women ages 67 years and over and women at risk for fractures or osteoporosis should discuss screening with their health care providers. Ask your health care provider whether you should take a calcium supplement or vitamin D to reduce the rate of osteoporosis.  Menopause can be associated with physical symptoms and risks. Hormone replacement therapy is available to decrease symptoms and risks. You should talk to your health care provider about whether hormone replacement therapy is right for you.  Use sunscreen. Apply sunscreen liberally and repeatedly throughout the day. You should seek shade when your shadow is shorter than you. Protect yourself by wearing long sleeves, pants, a wide-brimmed hat, and sunglasses year round, whenever you are outdoors.  Once a month, do a whole body skin exam, using a mirror to look at the skin on your back. Tell your health  care provider of new moles, moles that have irregular borders, moles that are larger than a pencil eraser, or moles that have changed in shape or color.  Stay current with required vaccines (immunizations).  Influenza vaccine. All adults should be immunized every year.  Tetanus, diphtheria, and acellular pertussis (Td, Tdap) vaccine. Pregnant women should receive 1 dose of Tdap vaccine during each pregnancy. The dose should be obtained regardless of the length of time since the last dose. Immunization is preferred during the 27th-36th week of gestation. An adult who has not previously received Tdap or who does not know her vaccine status should receive 1 dose of Tdap. This initial dose should be followed by tetanus and diphtheria toxoids (Td) booster doses every 10 years. Adults with an unknown or incomplete history of completing a 3-dose immunization series with Td-containing vaccines should begin or complete a primary immunization series including a Tdap dose. Adults should receive a Td booster every 10 years.  Varicella vaccine. An adult without evidence of immunity to varicella should receive 2 doses or a second dose if she has previously received 1 dose. Pregnant females who do not have evidence of immunity should receive the first dose after pregnancy. This first dose should be obtained before leaving the health care facility. The second dose should be obtained 4-8 weeks after the first dose.  Human papillomavirus (HPV) vaccine. Females aged 13-26 years who have not received the vaccine previously should obtain the 3-dose series. The vaccine is not recommended for use in pregnant females. However, pregnancy testing is not needed before receiving a dose. If a female is found to be pregnant after receiving a dose, no treatment is needed. In that case, the remaining doses should be delayed until after the pregnancy. Immunization is recommended for any person with an immunocompromised condition through  the age of 27 years if she did not get any or all doses earlier. During the 3-dose series, the second dose should be obtained 4-8 weeks after the first dose. The third dose should be obtained 24 weeks after the first dose and 16 weeks after the second dose.  Zoster vaccine. One dose is recommended for adults aged 79 years or older unless certain conditions are  present.  Measles, mumps, and rubella (MMR) vaccine. Adults born before [birth year] generally are considered immune to measles and mumps. Adults born in [birth year] or later should have 1 or more doses of MMR vaccine unless there is a contraindication to the vaccine or there is laboratory evidence of immunity to each of the three diseases. A routine second dose of MMR vaccine should be obtained at least 28 days after the first dose for students attending postsecondary schools, health care workers, or international travelers. People who received inactivated measles vaccine or an unknown type of measles vaccine during 1963-1967 should receive 2 doses of MMR vaccine. People who received inactivated mumps vaccine or an unknown type of mumps vaccine before 1979 and are at high risk for mumps infection should consider immunization with 2 doses of MMR vaccine. For females of childbearing age, rubella immunity should be determined. If there is no evidence of immunity, females who are not pregnant should be vaccinated. If there is no evidence of immunity, females who are pregnant should delay immunization until after pregnancy. Unvaccinated health care workers born before 29 who lack laboratory evidence of measles, mumps, or rubella immunity or laboratory confirmation of disease should consider measles and mumps immunization with 2 doses of MMR vaccine or rubella immunization with 1 dose of MMR vaccine.  Pneumococcal 13-valent conjugate (PCV13) vaccine. When indicated, a person who is uncertain of his immunization history and has no record of immunization should receive the  PCV13 vaccine. All adults 82 years of age and older should receive this vaccine. An adult aged 80 years or older who has certain medical conditions and has not been previously immunized should receive 1 dose of PCV13 vaccine. This PCV13 should be followed with a dose of pneumococcal polysaccharide (PPSV23) vaccine. Adults who are at high risk for pneumococcal disease should obtain the PPSV23 vaccine at least 8 weeks after the dose of PCV13 vaccine. Adults older than 29 years of age who have normal immune system function should obtain the PPSV23 vaccine dose at least 1 year after the dose of PCV13 vaccine.  Pneumococcal polysaccharide (PPSV23) vaccine. When PCV13 is also indicated, PCV13 should be obtained first. All adults aged 6 years and older should be immunized. An adult younger than age 61 years who has certain medical conditions should be immunized. Any person who resides in a nursing home or long-term care facility should be immunized. An adult smoker should be immunized. People with an immunocompromised condition and certain other conditions should receive both PCV13 and PPSV23 vaccines. People with human immunodeficiency virus (HIV) infection should be immunized as soon as possible after diagnosis. Immunization during chemotherapy or radiation therapy should be avoided. Routine use of PPSV23 vaccine is not recommended for American Indians, [place] Natives, or people younger than 65 years unless there are medical conditions that require PPSV23 vaccine. When indicated, people who have unknown immunization and have no record of immunization should receive PPSV23 vaccine. One-time revaccination 5 years after the first dose of PPSV23 is recommended for people aged 19-64 years who have chronic kidney failure, nephrotic syndrome, asplenia, or immunocompromised conditions. People who received 1-2 doses of PPSV23 before age 33 years should receive another dose of PPSV23 vaccine at age 24 years or later if at least  5 years have passed since the previous dose. Doses of PPSV23 are not needed for people immunized with PPSV23 at or after age 8 years.  Meningococcal vaccine. Adults with asplenia or persistent complement component deficiencies should receive 2 doses of  quadrivalent meningococcal conjugate (MenACWY-D) vaccine. The doses should be obtained at least 2 months apart. Microbiologists working with certain meningococcal bacteria, [employer] recruits, people at risk during an outbreak, and people who travel to or live in countries with a high rate of meningitis should be immunized. A first-year college student up through age 63 years who is living in a residence hall should receive a dose if she did not receive a dose on or after her 16th birthday. Adults who have certain high-risk conditions should receive one or more doses of vaccine.  Hepatitis A vaccine. Adults who wish to be protected from this disease, have certain high-risk conditions, work with hepatitis A-infected animals, work in hepatitis A research labs, or travel to or work in countries with a high rate of hepatitis A should be immunized. Adults who were previously unvaccinated and who anticipate close contact with an international adoptee during the first 60 days after arrival in the [place] from a country with a high rate of hepatitis A should be immunized.  Hepatitis B vaccine. Adults who wish to be protected from this disease, have certain high-risk conditions, may be exposed to blood or other infectious body fluids, are household contacts or sex partners of hepatitis B positive people, are clients or workers in certain care facilities, or travel to or work in countries with a high rate of hepatitis B should be immunized.  Haemophilus influenzae type b (Hib) vaccine. A previously unvaccinated person with asplenia or sickle cell disease or having a scheduled splenectomy should receive 1 dose of Hib vaccine. Regardless of previous immunization, a  recipient of a hematopoietic stem cell transplant should receive a 3-dose series 6-12 months after her successful transplant. Hib vaccine is not recommended for adults with HIV infection. Preventive Services / Frequency Ages 20 to 62 years  Blood pressure check.** / Every 3-5 years.  Lipid and cholesterol check.** / Every 5 years beginning at age 89.  Clinical breast exam.** / Every 3 years for women in their 22s and 74s.  BRCA-related cancer risk assessment.** / For women who have family members with a BRCA-related cancer (breast, ovarian, tubal, or peritoneal cancers).  Pap test.** / Every 2 years from ages 59 through 29. Every 3 years starting at age 25 through age 41 or 10 with a history of 3 consecutive normal Pap tests.  HPV screening.** / Every 3 years from ages 11 through ages 79 to 58 with a history of 3 consecutive normal Pap tests.  Hepatitis C blood test.** / For any individual with known risks for hepatitis C.  Skin self-exam. / Monthly.  Influenza vaccine. / Every year.  Tetanus, diphtheria, and acellular pertussis (Tdap, Td) vaccine.** / Consult your health care provider. Pregnant women should receive 1 dose of Tdap vaccine during each pregnancy. 1 dose of Td every 10 years.  Varicella vaccine.** / Consult your health care provider. Pregnant females who do not have evidence of immunity should receive the first dose after pregnancy.  HPV vaccine. / 3 doses over 6 months, if 30 and younger. The vaccine is not recommended for use in pregnant females. However, pregnancy testing is not needed before receiving a dose.  Measles, mumps, rubella (MMR) vaccine.** / You need at least 1 dose of MMR if you were born in 1957 or later. You may also need a 2nd dose. For females of childbearing age, rubella immunity should be determined. If there is no evidence of immunity, females who are not pregnant should be vaccinated.  If there is no evidence of immunity, females who are pregnant  should delay immunization until after pregnancy.  Pneumococcal 13-valent conjugate (PCV13) vaccine.** / Consult your health care provider.  Pneumococcal polysaccharide (PPSV23) vaccine.** / 1 to 2 doses if you smoke cigarettes or if you have certain conditions.  Meningococcal vaccine.** / 1 dose if you are age 54 to 50 years and a Market researcher living in a residence hall, or have one of several medical conditions, you need to get vaccinated against meningococcal disease. You may also need additional booster doses.  Hepatitis A vaccine.** / Consult your health care provider.  Hepatitis B vaccine.** / Consult your health care provider.  Haemophilus influenzae type b (Hib) vaccine.** / Consult your health care provider. Ages 48 to 7 years  Blood pressure check.** / Every year.  Lipid and cholesterol check.** / Every 5 years beginning at age 20 years.  Lung cancer screening. / Every year if you are aged 76-80 years and have a 30-pack-year history of smoking and currently smoke or have quit within the past 15 years. Yearly screening is stopped once you have quit smoking for at least 15 years or develop a health problem that would prevent you from having lung cancer treatment.  Clinical breast exam.** / Every year after age 106 years.  BRCA-related cancer risk assessment.** / For women who have family members with a BRCA-related cancer (breast, ovarian, tubal, or peritoneal cancers).  Mammogram.** / Every year beginning at age 15 years and continuing for as long as you are in good health. Consult with your health care provider.  Pap test.** / Every 3 years starting at age 55 years through age 49 or 72 years with a history of 3 consecutive normal Pap tests.  HPV screening.** / Every 3 years from ages 48 years through ages 41 to 25 years with a history of 3 consecutive normal Pap tests.  Fecal occult blood test (FOBT) of stool. / Every year beginning at age 64 years and  continuing until age 36 years. You may not need to do this test if you get a colonoscopy every 10 years.  Flexible sigmoidoscopy or colonoscopy.** / Every 5 years for a flexible sigmoidoscopy or every 10 years for a colonoscopy beginning at age 40 years and continuing until age 53 years.  Hepatitis C blood test.** / For all people born from [birth year] through 1963 and any individual with known risks for hepatitis C.  Skin self-exam. / Monthly.  Influenza vaccine. / Every year.  Tetanus, diphtheria, and acellular pertussis (Tdap/Td) vaccine.** / Consult your health care provider. Pregnant women should receive 1 dose of Tdap vaccine during each pregnancy. 1 dose of Td every 10 years.  Varicella vaccine.** / Consult your health care provider. Pregnant females who do not have evidence of immunity should receive the first dose after pregnancy.  Zoster vaccine.** / 1 dose for adults aged 53 years or older.  Measles, mumps, rubella (MMR) vaccine.** / You need at least 1 dose of MMR if you were born in 1957 or later. You may also need a second dose. For females of childbearing age, rubella immunity should be determined. If there is no evidence of immunity, females who are not pregnant should be vaccinated. If there is no evidence of immunity, females who are pregnant should delay immunization until after pregnancy.  Pneumococcal 13-valent conjugate (PCV13) vaccine.** / Consult your health care provider.  Pneumococcal polysaccharide (PPSV23) vaccine.** / 1 to 2 doses if you smoke cigarettes  or if you have certain conditions.  Meningococcal vaccine.** / Consult your health care provider.  Hepatitis A vaccine.** / Consult your health care provider.  Hepatitis B vaccine.** / Consult your health care provider.  Haemophilus influenzae type b (Hib) vaccine.** / Consult your health care provider. Ages 85 years and over  Blood pressure check.** / Every year.  Lipid and cholesterol check.** / Every 5 years  beginning at age 16 years.  Lung cancer screening. / Every year if you are aged 63-80 years and have a 30-pack-year history of smoking and currently smoke or have quit within the past 15 years. Yearly screening is stopped once you have quit smoking for at least 15 years or develop a health problem that would prevent you from having lung cancer treatment.  Clinical breast exam.** / Every year after age 89 years.  BRCA-related cancer risk assessment.** / For women who have family members with a BRCA-related cancer (breast, ovarian, tubal, or peritoneal cancers).  Mammogram.** / Every year beginning at age 60 years and continuing for as long as you are in good health. Consult with your health care provider.  Pap test.** / Every 3 years starting at age 63 years through age 55 or 53 years with 3 consecutive normal Pap tests. Testing can be stopped between 65 and 70 years with 3 consecutive normal Pap tests and no abnormal Pap or HPV tests in the past 10 years.  HPV screening.** / Every 3 years from ages 91 years through ages 5 or 90 years with a history of 3 consecutive normal Pap tests. Testing can be stopped between 65 and 70 years with 3 consecutive normal Pap tests and no abnormal Pap or HPV tests in the past 10 years.  Fecal occult blood test (FOBT) of stool. / Every year beginning at age 7 years and continuing until age 52 years. You may not need to do this test if you get a colonoscopy every 10 years.  Flexible sigmoidoscopy or colonoscopy.** / Every 5 years for a flexible sigmoidoscopy or every 10 years for a colonoscopy beginning at age 76 years and continuing until age 1 years.  Hepatitis C blood test.** / For all people born from [birth year] through 1963 and any individual with known risks for hepatitis C.  Osteoporosis screening.** / A one-time screening for women ages 24 years and over and women at risk for fractures or osteoporosis.  Skin self-exam. / Monthly.  Influenza vaccine. /  Every year.  Tetanus, diphtheria, and acellular pertussis (Tdap/Td) vaccine.** / 1 dose of Td every 10 years.  Varicella vaccine.** / Consult your health care provider.  Zoster vaccine.** / 1 dose for adults aged 35 years or older.  Pneumococcal 13-valent conjugate (PCV13) vaccine.** / Consult your health care provider.  Pneumococcal polysaccharide (PPSV23) vaccine.** / 1 dose for all adults aged 36 years and older.  Meningococcal vaccine.** / Consult your health care provider.  Hepatitis A vaccine.** / Consult your health care provider.  Hepatitis B vaccine.** / Consult your health care provider.  Haemophilus influenzae type b (Hib) vaccine.** / Consult your health care provider. ** Family history and personal history of risk and conditions may change your health care provider's recommendations.   This information is not intended to replace advice given to you by your health care provider. Make sure you discuss any questions you have with your health care provider.   Document Released: 03/08/2001 Document Revised: 01/31/2014 Document Reviewed: 06/07/2010 Elsevier Interactive Patient Education [employer].

## 2015-07-22 ENCOUNTER — Ambulatory Visit (INDEPENDENT_AMBULATORY_CARE_PROVIDER_SITE_OTHER): Payer: Commercial Managed Care - PPO | Admitting: Internal Medicine

## 2015-07-22 ENCOUNTER — Encounter: Payer: Self-pay | Admitting: Internal Medicine

## 2015-07-22 ENCOUNTER — Other Ambulatory Visit (INDEPENDENT_AMBULATORY_CARE_PROVIDER_SITE_OTHER): Payer: Commercial Managed Care - PPO

## 2015-07-22 VITALS — BP 100/70 | HR 72 | Ht 63.0 in | Wt 139.5 lb

## 2015-07-22 DIAGNOSIS — D509 Iron deficiency anemia, unspecified: Secondary | ICD-10-CM | POA: Diagnosis not present

## 2015-07-22 DIAGNOSIS — K219 Gastro-esophageal reflux disease without esophagitis: Secondary | ICD-10-CM

## 2015-07-22 DIAGNOSIS — K50019 Crohn's disease of small intestine with unspecified complications: Secondary | ICD-10-CM | POA: Diagnosis not present

## 2015-07-22 LAB — CBC WITH DIFFERENTIAL/PLATELET
BASOS PCT: 0.4 % (ref 0.0–3.0)
Basophils Absolute: 0 10*3/uL (ref 0.0–0.1)
EOS PCT: 1.8 % (ref 0.0–5.0)
Eosinophils Absolute: 0.2 10*3/uL (ref 0.0–0.7)
HEMATOCRIT: 37.9 % (ref 36.0–46.0)
HEMOGLOBIN: 12.8 g/dL (ref 12.0–15.0)
Lymphocytes Relative: 36.1 % (ref 12.0–46.0)
Lymphs Abs: 3.2 10*3/uL (ref 0.7–4.0)
MCHC: 33.7 g/dL (ref 30.0–36.0)
MCV: 85.4 fl (ref 78.0–100.0)
MONO ABS: 0.7 10*3/uL (ref 0.1–1.0)
Monocytes Relative: 8 % (ref 3.0–12.0)
Neutro Abs: 4.8 10*3/uL (ref 1.4–7.7)
Neutrophils Relative %: 53.7 % (ref 43.0–77.0)
Platelets: 266 10*3/uL (ref 150.0–400.0)
RBC: 4.44 Mil/uL (ref 3.87–5.11)
RDW: 13 % (ref 11.5–15.5)
WBC: 9 10*3/uL (ref 4.0–10.5)

## 2015-07-22 LAB — COMPREHENSIVE METABOLIC PANEL
ALBUMIN: 4 g/dL (ref 3.5–5.2)
ALT: 14 U/L (ref 0–35)
AST: 15 U/L (ref 0–37)
Alkaline Phosphatase: 40 U/L (ref 39–117)
BUN: 9 mg/dL (ref 6–23)
CHLORIDE: 107 meq/L (ref 96–112)
CO2: 26 mEq/L (ref 19–32)
Calcium: 9.5 mg/dL (ref 8.4–10.5)
Creatinine, Ser: 0.67 mg/dL (ref 0.40–1.20)
GFR: 111.48 mL/min (ref >60.00)
Glucose, Bld: 76 mg/dL (ref 70–99)
POTASSIUM: 3.8 meq/L (ref 3.5–5.1)
SODIUM: 137 meq/L (ref 135–145)
Total Bilirubin: 0.3 mg/dL (ref 0.2–1.2)
Total Protein: 7.4 g/dL (ref 6.0–8.3)

## 2015-07-22 LAB — VITAMIN B12: VITAMIN B 12: 180 pg/mL — AB (ref 211–911)

## 2015-07-22 LAB — LIPID PANEL
CHOLESTEROL: 163 mg/dL (ref 0–200)
HDL: 70.6 mg/dL (ref >39.00)
LDL CALC: 73 mg/dL (ref 0–99)
NONHDL: 92.23
Total CHOL/HDL Ratio: 2
Triglycerides: 95 mg/dL (ref 0.0–149.0)
VLDL: 19 mg/dL (ref 0.0–40.0)

## 2015-07-22 LAB — HIGH SENSITIVITY CRP: CRP, High Sensitivity: 0.81 mg/L (ref 0.000–5.000)

## 2015-07-22 LAB — SEDIMENTATION RATE: SED RATE: 6 mm/h (ref 0–20)

## 2015-07-22 LAB — VITAMIN D 25 HYDROXY (VIT D DEFICIENCY, FRACTURES): VITD: 27.64 ng/mL — ABNORMAL LOW (ref 30.00–100.00)

## 2015-07-22 LAB — TSH: TSH: 1.58 u[IU]/mL (ref 0.35–4.50)

## 2015-07-22 MED ORDER — HYOSCYAMINE SULFATE 0.125 MG SL SUBL: SUBLINGUAL_TABLET | SUBLINGUAL | Status: DC

## 2015-07-22 MED ORDER — FAMOTIDINE 20 MG PO TABS
20.0000 mg | ORAL_TABLET | Freq: Two times a day (BID) | ORAL | Status: DC
Start: 2015-07-22 — End: 2017-12-26

## 2015-07-22 NOTE — Progress Notes (Signed)
Subjective:    Patient ID: Carla Clark, female    DOB: January 11, 1986, 30 y.o.   MRN: [id number]  HPI Carla Clark is a 30 year old female with ileal Crohn's disease diagnosed in 2013 is here for follow-up. She also has a history of iron deficiency anemia and was admitted with small bowel obstruction earlier this year. She took Humira 40 mg weekly 8 weeks and then return to every 2 week dosing as of 07/01/2015. She has now weaned off prednisone entirely. She took her trip to [place] for 10 days and did well. She ate spicy food there and took 2 separate doses of prednisone 20 mg to try to "prevent something from going wrong". She still reports some issues with abdominal pain and cramping as well as abdominal "tightness". At times the pain feels burning in nature. This is overall much improved from earlier this she. It did not seem to worsen when she completed prednisone. Bowel movements have been regular with no diarrhea recently. At times her bowel movements are hard "pellets". She feels this is worse if she gets dehydrated. She is having some heartburn. No dysphagia or odynophagia.  Review of Systems As per history of present illness, otherwise negative  Current Medications, Allergies, Past Medical History, Past Surgical History, Family History and Social History were reviewed in [employer] record.     Objective:   Physical Exam BP 100/70 mmHg  Pulse 72  Ht 5' 3"  (1.6 m)  Wt 139 lb 8 oz (63.277 kg)  BMI 24.72 kg/m2  LMP 07/01/2015 Constitutional: Well-developed and well-nourished. No distress. HEENT: Normocephalic and atraumatic. Oropharynx is clear and moist. No oropharyngeal exudate. Conjunctivae are normal.  No scleral icterus. Neck: Neck supple. Trachea midline. Cardiovascular: Normal rate, regular rhythm and intact distal pulses. No M/R/G Pulmonary/chest: Effort normal and breath sounds normal. No wheezing, rales or rhonchi. Abdominal: Soft, Right lower  quadrant tenderness without rebound or guarding , nondistended. Bowel sounds active throughout. There are no masses palpable. No hepatosplenomegaly. Extremities: no clubbing, cyanosis, or edema Lymphadenopathy: No cervical adenopathy noted. Neurological: Alert and oriented to person place and time. Skin: Skin is warm and dry. No rashes noted. Psychiatric: Normal mood and affect. Behavior is normal.  CBC    Component Value Date/Time   WBC 9.0 07/22/2015 1447   WBC 12.0* 05/11/2015 1334   RBC 4.44 07/22/2015 1447   RBC 4.56 05/11/2015 1334   RBC 4.56 01/20/2015 1011   HGB 12.8 07/22/2015 1447   HGB 13.4 05/11/2015 1334   HCT 37.9 07/22/2015 1447   HCT 40.3 05/11/2015 1334   PLT 266.0 07/22/2015 1447   PLT 277 05/11/2015 1334   MCV 85.4 07/22/2015 1447   MCV 88 05/11/2015 1334   MCH 29.4 05/11/2015 1334   MCH 28.8 04/18/2015 0433   MCHC 33.7 07/22/2015 1447   MCHC 33.3 05/11/2015 1334   RDW 13.0 07/22/2015 1447   RDW 13.6 05/11/2015 1334   LYMPHSABS 3.2 07/22/2015 1447   LYMPHSABS 1.2 05/11/2015 1334   MONOABS 0.7 07/22/2015 1447   EOSABS 0.2 07/22/2015 1447   EOSABS 0.0 05/11/2015 1334   BASOSABS 0.0 07/22/2015 1447   BASOSABS 0.0 05/11/2015 1334    CMP     Component Value Date/Time   NA 137 07/22/2015 1447   K 3.8 07/22/2015 1447   CL 107 07/22/2015 1447   CO2 26 07/22/2015 1447   GLUCOSE 76 07/22/2015 1447   BUN 9 07/22/2015 1447   CREATININE 0.67 07/22/2015 1447  CREATININE 0.62 08/20/2012 1139   CALCIUM 9.5 07/22/2015 1447   PROT 7.4 07/22/2015 1447   ALBUMIN 4.0 07/22/2015 1447   AST 15 07/22/2015 1447   ALT 14 07/22/2015 1447   ALKPHOS 40 07/22/2015 1447   BILITOT 0.3 07/22/2015 1447   GFRNONAA >60 04/18/2015 0433   GFRAA >60 04/18/2015 0433   Iron/TIBC/Ferritin/ %Sat    Component Value Date/Time   IRON 100 05/11/2015 1334   IRON 56 12/03/2012 0953   TIBC 310 05/11/2015 1334   FERRITIN 22 05/11/2015 1334   FERRITIN 7.9* 12/03/2012 0953    IRONPCTSAT 32 05/11/2015 1334   IRONPCTSAT 13.3* 12/03/2012 0953        Assessment & Plan:  30 year old female with ileal Crohn's disease diagnosed in 2013 is here for follow-up.   1. Crohn's ileitis -- admission earlier this year for small bowel obstruction. She completed prednisone taper and completed 8 weeks of weekly Humira. Humira antibodies were negative. I still believe she is having some symptoms of active Crohn's disease in the right lower quadrant. She's having some cramping which may be an irritable bowel type side effect as well. No obstructive symptoms at present. I recommended she continue a low-residue diet. Continue Humira 40 mg every 14 days. Trial of Levsin 0.125 mg sublingual 1-2 tablets every 6 hours as needed for crampy lower abdominal or midabdominal pain. If she develops worsening pain or partial obstructive symptoms, which we have discussed at length today she is asked to notify me immediately. If this occurs I would recommend MR enterography. We did briefly discuss the possibility of surgical resection if her obstruction recurs or her symptoms fail to improve over time.  2. IDA -- follow with hematology. Check CBC, CMP today. Hemoglobin now normal.  3. GERD -- I recommended daily famotidine 20 mg in the morning with the second dose in the evening if necessary.  Three-month follow-up, sooner if necessary 25 minutes spent with the patient today. Greater than 50% was spent in counseling and coordination of care with the patient

## 2015-07-22 NOTE — Patient Instructions (Signed)
Continue Humira 40 mg every 14 days.  We have sent the following medications to your pharmacy for you to pick up at your convenience: Famotidine 20 mg every morning (and every evening if needed) Levsin 1-2 tablets every 6 hours as needed for spasm/pain  Follow a low residue diet (we have given you a brochure on this).  Your physician has requested that you go to the basement for the following lab work before leaving today: CBC, Sed, Vitamin D, B12, CRP, Lipid, CMP, CBC, TSH  Please follow up with Dr Hilarie Fredrickson in 3-4 months.  If you are age 32 or older, your body mass index should be between 23-30. Your Body mass index is 24.72 kg/(m^2). If this is out of the aforementioned range listed, please consider follow up with your Primary Care Provider.  If you are age 51 or younger, your body mass index should be between 19-25. Your Body mass index is 24.72 kg/(m^2). If this is out of the aformentioned range listed, please consider follow up with your Primary Care Provider.

## 2015-07-26 NOTE — Progress Notes (Signed)
Patient ID: Carla Clark, female   DOB: 03/28/1985, 30 y.o.   MRN: [id number]   Subjective:    Patient ID: Carla Clark, female    DOB: [birth year], 30 y.o.   MRN: [id number]  Chief Complaint  Patient presents with  . Annual Exam    HPI Patient is in today for annual exam. She has had some increase in abdominal pain and cramps lately but not severe. No recent hospitalization or fevers. No other acute concerns. Denies CP/palp/SOB/HA/congestion/fevers or GU c/o. Taking meds as prescribed  Past Medical History  Diagnosis Date  . Chicken pox as a child  . Anemia 11/2011    iron deficiency. 01/2013 parenteral iron. Intranasal B12.  Dr Marin Olp follows.   . Breast mass in female 04/21/2012  . Cervical cancer screening 01/27/2012  . Crohn's disease ([place]) 2013    ileitis 10/2011. ileitis, mesenteric abscess, ? entero-enteric fistula on 08/2012 CT enterography  . Vitamin D deficiency 2014  . GERD (gastroesophageal reflux disease)   . Hepatitis B immune 07/2012    previous vaccination.   . B12 deficiency 11/2012  . Hives 09/2014    per Dermatologist.  Rx Zyrtec, Benadryl.   . Small bowel obstruction [facility])     Past Surgical History  Procedure Laterality Date  . Wisdom tooth extraction  30 yrs old    Family History  Problem Relation Age of Onset  . Nephrolithiasis Father   . Lung cancer Maternal Grandmother   . Gout Maternal Grandfather   . Ovarian cancer Paternal Grandmother   . Cancer Paternal Grandmother 82    ovarian  . Pancreatic cancer Paternal Grandfather   . Cancer Paternal Aunt     ovarian    Social History   Social History  . Marital Status: Single    Spouse Name: N/A  . Number of Children: N/A  . Years of Education: N/A   Occupational History  . teacher    Social History Main Topics  . Smoking status: Never Smoker   . Smokeless tobacco: Never Used     Comment: never used tobacco  . Alcohol Use: 0.0 oz/week    0 Standard drinks or equivalent per week   Comment: occasionaly  . Drug Use: No  . Sexual Activity:    Partners: Male    Birth Control/ Protection: Pill     Comment: lives with husband, works at [employer] at [employer]  . Not on file   Social History Narrative    Outpatient Prescriptions Prior to Visit  Medication Sig Dispense Refill  . Cetirizine HCl (ZYRTEC ALLERGY PO) Take by mouth as needed.     . desogestrel-ethinyl estradiol (VIORELE) 0.15-0.02/0.01 MG (21/5) tablet Take 1 tablet by mouth daily. 84 tablet 0  . diphenhydrAMINE (BENADRYL) 25 mg capsule Take 25 mg by mouth as needed for allergies.     . Probiotic Product (MISC INTESTINAL FLORA REGULAT) CHEW Chew by mouth every other day. Digestive Health probiotic gummies by [employer]    . famotidine (PEPCID) 20 MG tablet Take 1 tablet (20 mg total) by mouth 2 (two) times daily. 60 tablet 1  . HUMIRA PEN 40 MG/0.8ML PNKT Inject 1 pen into the skin every 14 (fourteen) days. (Patient taking differently: Inject 1 pen into the skin every 7 (seven) days. ) 2 each 10  . Adalimumab (HUMIRA) 40 MG/0.8ML PSKT Inject 0.8 mLs (40 mg total) into the skin every 7 (seven) days. 2 each 3  . predniSONE (DELTASONE)  10 MG tablet Take as directed (Patient not taking: Reported on 07/22/2015) 50 tablet 0   No facility-administered medications prior to visit.    No Known Allergies  Review of Systems  Constitutional: Negative for fever, chills and malaise/fatigue.  HENT: Negative for congestion and hearing loss.   Eyes: Negative for discharge.  Respiratory: Negative for cough, sputum production and shortness of breath.   Cardiovascular: Negative for chest pain, palpitations and leg swelling.  Gastrointestinal: Positive for abdominal pain. Negative for heartburn, nausea, vomiting, diarrhea, constipation and blood in stool.  Genitourinary: Negative for dysuria, urgency, frequency and hematuria.  Musculoskeletal: Negative for myalgias, back pain and falls.  Skin: Negative for  rash.  Neurological: Negative for dizziness, sensory change, loss of consciousness, weakness and headaches.  Endo/Heme/Allergies: Negative for environmental allergies. Does not bruise/bleed easily.  Psychiatric/Behavioral: Negative for depression and suicidal ideas. The patient is not nervous/anxious and does not have insomnia.        Objective:    Physical Exam  Constitutional: She is oriented to person, place, and time. She appears well-developed and well-nourished. No distress.  HENT:  Head: Normocephalic and atraumatic.  Eyes: Conjunctivae are normal.  Neck: Neck supple. No thyromegaly present.  Cardiovascular: Normal rate, regular rhythm and normal heart sounds.   No murmur heard. Pulmonary/Chest: Effort normal and breath sounds normal. No respiratory distress.  Abdominal: Soft. Bowel sounds are normal. She exhibits no distension and no mass. There is tenderness. There is no rebound and no guarding.  Musculoskeletal: She exhibits no edema.  Lymphadenopathy:    She has no cervical adenopathy.  Neurological: She is alert and oriented to person, place, and time.  Skin: Skin is warm and dry.  Psychiatric: She has a normal mood and affect. Her behavior is normal.    BP 102/68 mmHg  Pulse 72  Temp(Src) 98.5 F (36.9 C) (Oral)  Ht 5' 4"  (1.626 m)  Wt 138 lb 4 oz (62.71 kg)  BMI 23.72 kg/m2  SpO2 98% Wt Readings from Last 3 Encounters:  07/22/15 139 lb 8 oz (63.277 kg)  07/21/15 138 lb 4 oz (62.71 kg)  05/13/15 134 lb (60.782 kg)     Lab Results  Component Value Date   WBC 9.0 07/22/2015   HGB 12.8 07/22/2015   HCT 37.9 07/22/2015   PLT 266.0 07/22/2015   GLUCOSE 76 07/22/2015   CHOL 163 07/22/2015   TRIG 95.0 07/22/2015   HDL 70.60 07/22/2015   LDLCALC 73 07/22/2015   ALT 14 07/22/2015   AST 15 07/22/2015   NA 137 07/22/2015   K 3.8 07/22/2015   CL 107 07/22/2015   CREATININE 0.67 07/22/2015   BUN 9 07/22/2015   CO2 26 07/22/2015   TSH 1.58 07/22/2015     Lab Results  Component Value Date   TSH 1.58 07/22/2015   Lab Results  Component Value Date   WBC 9.0 07/22/2015   HGB 12.8 07/22/2015   HCT 37.9 07/22/2015   MCV 85.4 07/22/2015   PLT 266.0 07/22/2015   Lab Results  Component Value Date   NA 137 07/22/2015   K 3.8 07/22/2015   CO2 26 07/22/2015   GLUCOSE 76 07/22/2015   BUN 9 07/22/2015   CREATININE 0.67 07/22/2015   BILITOT 0.3 07/22/2015   ALKPHOS 40 07/22/2015   AST 15 07/22/2015   ALT 14 07/22/2015   PROT 7.4 07/22/2015   ALBUMIN 4.0 07/22/2015   CALCIUM 9.5 07/22/2015   ANIONGAP 7 04/18/2015   GFR 111.48  07/22/2015   Lab Results  Component Value Date   CHOL 163 07/22/2015   Lab Results  Component Value Date   HDL 70.60 07/22/2015   Lab Results  Component Value Date   LDLCALC 73 07/22/2015   Lab Results  Component Value Date   TRIG 95.0 07/22/2015   Lab Results  Component Value Date   CHOLHDL 2 07/22/2015   No results found for: HGBA1C     Assessment & Plan:   Problem List Items Addressed This Visit    Other iron deficiency anemias (Chronic)    Has resolved with treatment of Crohn's       Preventative health care - Primary    Patient encouraged to maintain heart healthy diet, regular exercise, adequate sleep. Consider daily probiotics. Take medications as prescribed. Given and reviewed copy of ACP documents from [employer] and encouraged to complete and return      Relevant Orders   TSH   CBC   Comprehensive metabolic panel   Lipid panel   C-reactive protein   Vitamin B12   VITAMIN D 25 Hydroxy (Vit-D Deficiency, Fractures)   Sed Rate (ESR)   Cervical cancer screening    Will refer to Dr Sabra Heck for consultation for preconception and pap      Relevant Orders   TSH   CBC   Comprehensive metabolic panel   Lipid panel   C-reactive protein   Vitamin B12   VITAMIN D 25 Hydroxy (Vit-D Deficiency, Fractures)   Sed Rate (ESR)   Ambulatory referral to Obstetrics /  Gynecology   Breast mass in female    No concerns today, does have fh of ovarian cancer. She will continue self exams and will consider further testing in future.       Crohn's disease ([place])    Mild abdominal discomfort but no significant flares. Has an appointment with gastroenterology this week.       Relevant Orders   TSH   CBC   Comprehensive metabolic panel   Lipid panel   C-reactive protein   Vitamin B12   VITAMIN D 25 Hydroxy (Vit-D Deficiency, Fractures)   Sed Rate (ESR)   Ambulatory referral to Obstetrics / Gynecology    Other Visit Diagnoses    Anemia, unspecified anemia type        Relevant Orders    TSH    CBC    Comprehensive metabolic panel    Lipid panel    C-reactive protein    Vitamin B12    VITAMIN D 25 Hydroxy (Vit-D Deficiency, Fractures)    Sed Rate (ESR)    Encounter for preconception consultation        Relevant Orders    Ambulatory referral to Obstetrics / Gynecology       I am having Ms. Wainwright maintain her Misc Intestinal Flora Regulat, Cetirizine HCl (ZYRTEC ALLERGY PO), diphenhydrAMINE, desogestrel-ethinyl estradiol, Adalimumab, and predniSONE.  No orders of the defined types were placed in this encounter.     Penni Homans, MD

## 2015-07-26 NOTE — Assessment & Plan Note (Signed)
No concerns today, does have fh of ovarian cancer. She will continue self exams and will consider further testing in future.

## 2015-07-26 NOTE — Assessment & Plan Note (Signed)
Has resolved with treatment of Crohn's

## 2015-07-30 ENCOUNTER — Telehealth: Payer: Self-pay | Admitting: Obstetrics & Gynecology

## 2015-07-30 ENCOUNTER — Ambulatory Visit (INDEPENDENT_AMBULATORY_CARE_PROVIDER_SITE_OTHER): Payer: Commercial Managed Care - PPO | Admitting: *Deleted

## 2015-07-30 DIAGNOSIS — E538 Deficiency of other specified B group vitamins: Secondary | ICD-10-CM | POA: Diagnosis not present

## 2015-07-30 MED ORDER — CYANOCOBALAMIN 1000 MCG/ML IJ SOLN: 1000.0000 ug | Freq: Once | INTRAMUSCULAR | Status: DC

## 2015-07-30 MED ORDER — CYANOCOBALAMIN 1000 MCG/ML IJ SOLN
1000.0000 ug | Freq: Once | INTRAMUSCULAR | Status: AC
  Administered 2015-07-30: 1000 ug via SUBCUTANEOUS

## 2015-07-30 MED ORDER — "SYRINGE/NEEDLE (DISP) 25G X 5/8"" 3 ML MISC": Status: DC

## 2015-07-30 MED ORDER — CYANOCOBALAMIN 1000 MCG/ML IJ SOLN: INTRAMUSCULAR | Status: DC

## 2015-07-30 MED FILL — CYANOCOBALAMIN 1,000 MCG/ML: 1000 | 30 days supply | Qty: 2 | Fill #0

## 2015-07-30 NOTE — Patient Instructions (Addendum)
Next Injection due 08/13/15, then begin taking injections once monthly.  Subcutaneous Injection Using a Syringe and Vial A subcutaneous (SQ) injection is an injection of medicine given into the layer between the skin and the muscle. Medicine can be put into a syringe with a needle attached. The medicine comes in a bottle (vial). SUPPLIES YOU WILL NEED:  Medicine prescribed by your caregiver.  Syringe.  Alcohol wipes.  Puncture proof sharps disposal container. Remember:  Follow the storage instructions on the information contained in the box the vial comes in. If appropriate, store medicine vials in the refrigerator.  Keep the medicine vials away from sunlight.  Check the date on the vial (expiration date) so you do not use old medicine.  Most medicines should be clear and colorless. Do not use the medicine if it is cloudy.   Some medicines, like insulin, may be cloudy. Do not use the medicine if you see clumping.  Use only the syringes that are given to you.  Use each syringe and needle 1 time only.  Store the medicine, syringes, and used syringe container out of children's reach. HOW TO PUT MEDICINE IN THE SYRINGE 1. Wash your hands with soap and water. 2. Warm the medicine by gently rolling the vial between your hands. Do not shake the vial. 3. Clean the top rubber part of the vial with an alcohol wipe. Be sure that the plastic pop-top has been removed on newer vials. 4. Remove the plastic cover from the needle on the syringe. Do not let the needle touch anything. 5. Pull the plunger back to draw air into the syringe. The air should be the same amount as the medicine dose. 6. Push the needle through the rubber on the top of the vial. Do not turn the vial over. 7. Push the plunger in all the way to put the air into the vial. 8. Leave the needle in the vial and turn the vial and syringe upside down. 9. Pull down slowly on the plunger, drawing the amount of medicine you need into  the syringe. 10. Look for air bubbles in the syringe. You may need to push the plunger up and down 2 to 3 times to slowly get rid of any air bubbles in the syringe. 11. Pull back the plunger to get your correct dose. 12. Remove the needle from the vial. HOW TO GIVE YOUR INJECTION 1. You can give an injection in certain areas. These areas include the abdomen (preferred), thigh, or the back of the arm. Choose the area where you are going to give your injection. Give your injection in a different spot each time within the same area. 2. Use an alcohol wipe to clean the area of the body to be injected. 3. Pinch up 1 inch of skin and hold it. 4. Put the needle straight into the skin (90-degree angle). Put the needle in as far as it will go (to the hub). The needle may need to be injected at a 45-degree angle in small adults or children with little fat. 5. When the needle is in, you can let go of your skin. 6. Push the plunger down all the way to inject the medicine. 7. Pull the needle straight out of your skin. 8. Press the alcohol wipe over the spot where you gave your injection. Keep it there for a few seconds. Do not rub the area. 9. Do not put the plastic cover back on the needle. THROWING AWAY SUPPLIES  Place your  needle and syringe in a puncture proof sharps disposal container. Follow the disposal regulations for the area where you live.  You may throw the used vial in the trash.   This information is not intended to replace advice given to you by your health care provider. Make sure you discuss any questions you have with your health care provider.   Document Released: 09/23/2010 Document Revised: 04/04/2011 Document Reviewed: 07/25/2014 Elsevier Interactive Patient Education [employer].

## 2015-07-30 NOTE — Telephone Encounter (Signed)
Called and left a message for patient to call back to schedule a new patient doctor referral. °

## 2015-07-30 NOTE — Progress Notes (Signed)
Pre visit review using our clinic review tool, if applicable. No additional management support is needed unless otherwise documented below in the visit note.  Pt here for 1st B12 injection, see result notes. Pt requested teaching to learn to do injection herself. Per Dr. Charlett Blake, okay to do B12 injection SQ. Pt already does Humira injections at home and is familiar w/ injection technique. Education provided on how to draw up medication, sharps safety, and proper injection technique. Pt successfully demonstrated first injection in office.  Next injection due 08/13/15. Encouraged pt to call the office w/ any questions or concerns, or schedule nurse visit and bring supplies from home if she would like to do next injection in office. Medication and supplies filled to pharmacy as requested.   Dorrene German, RN

## 2015-07-31 NOTE — Telephone Encounter (Signed)
Called and left a message for patient to call back to schedule a new patient doctor referral. °

## 2015-08-03 IMAGING — CT CT ENTEROGRAPHY (ABD-PELV W/ CM)
1 of 3 series · 13 of 32 positions shown, 18 images · IV contrast (OMNIPAQUE)
Comparison: 12/21/2011

CLINICAL DATA: Crohn's disease

CT ABDOMEN AND PELVIS WITH CONTRAST (CT ENTEROGRAPHY)
TECHNIQUE: Multidetector CT of the abdomen and pelvis during bolus
administration of intravenous contrast. Negative oral contrast
VoLumen was given.
Contrast: 100mL OMNIPAQUE IOHEXOL 300 MG/ML  SOLN

[Series 3: enterography thins pacs · axial · 0.67mm/px · z∈[-439,-49]mm · 13 of 148 slices shown, 18 images]
[im 9/148  soft-tissue]
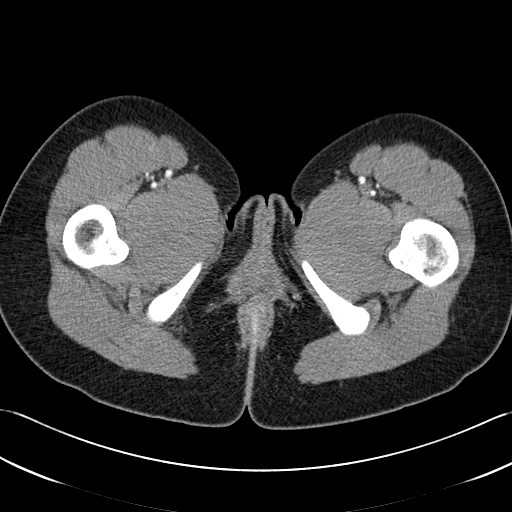
[im 9/148  bone]
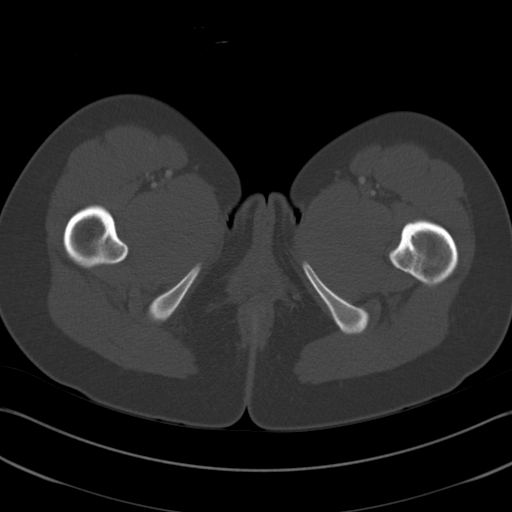
[im 25/148  soft-tissue]
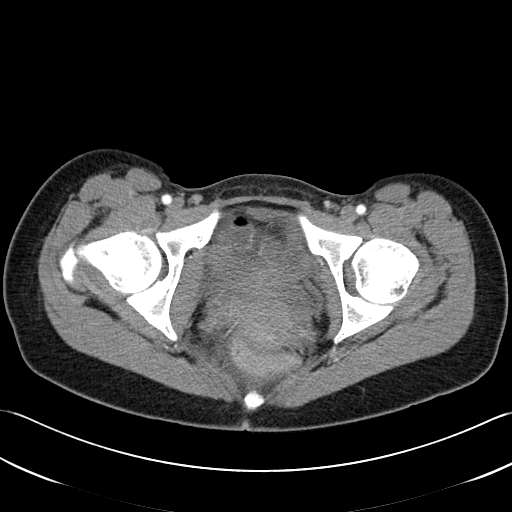
[im 33/148  soft-tissue]
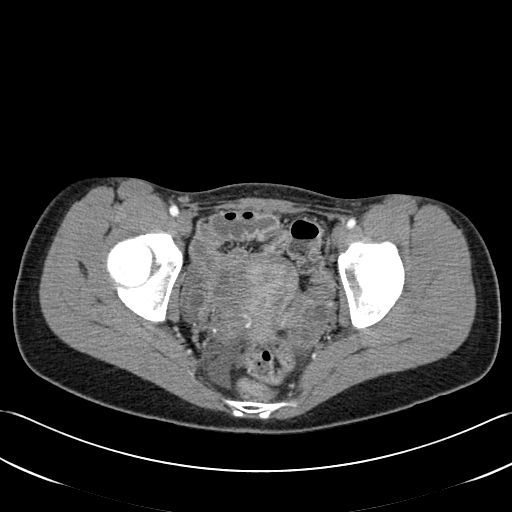
[im 41/148  soft-tissue]
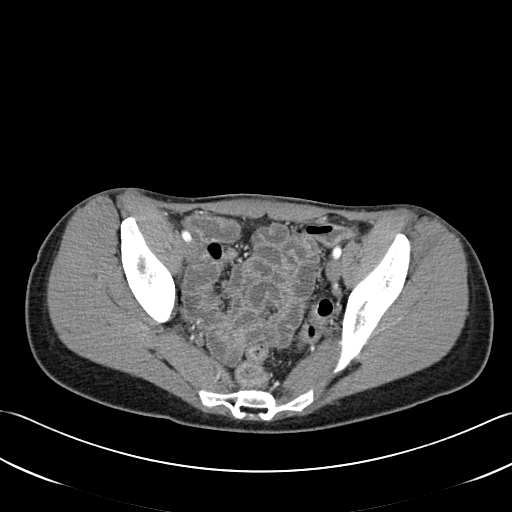
[im 58/148  soft-tissue]
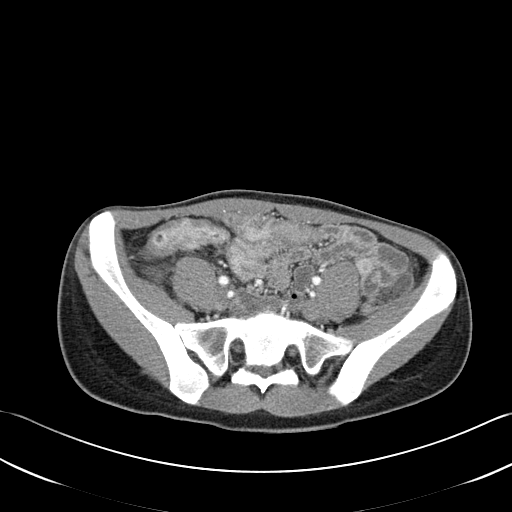
[im 66/148  soft-tissue]
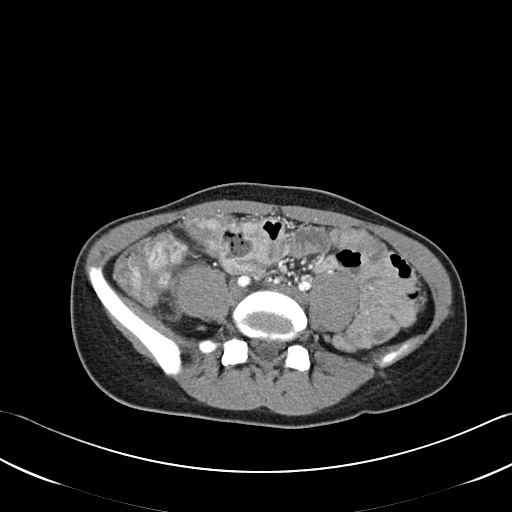
[im 82/148  soft-tissue]
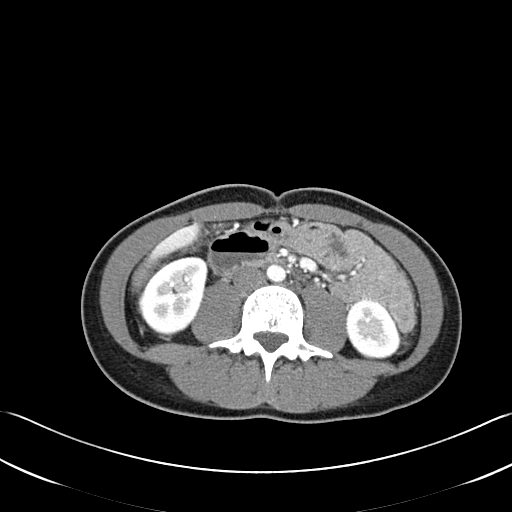
[im 90/148  soft-tissue]
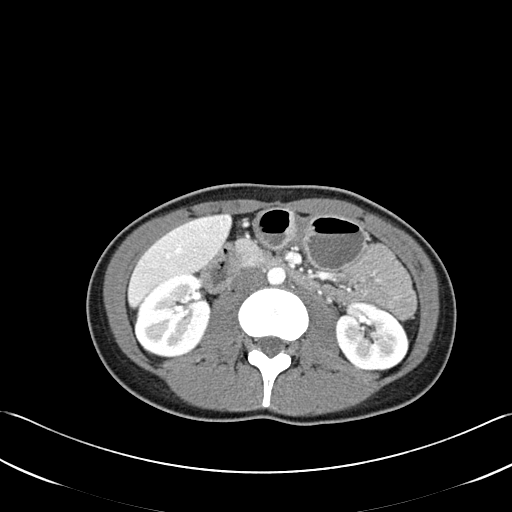
[im 107/148  soft-tissue]
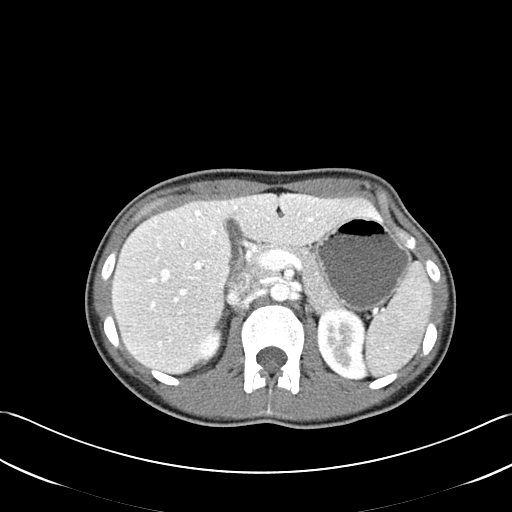
[im 107/148  bone]
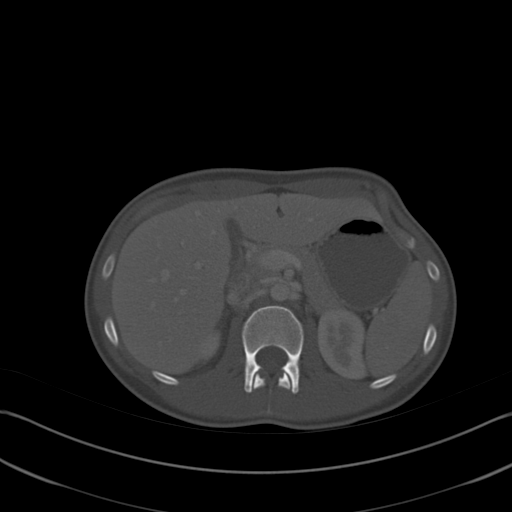
[im 115/148  soft-tissue]
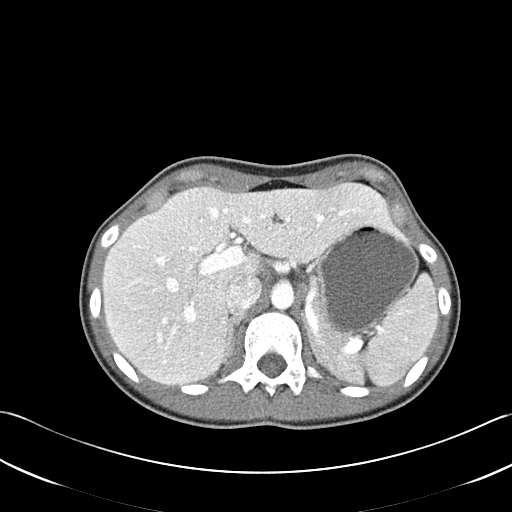
[im 115/148  lung]
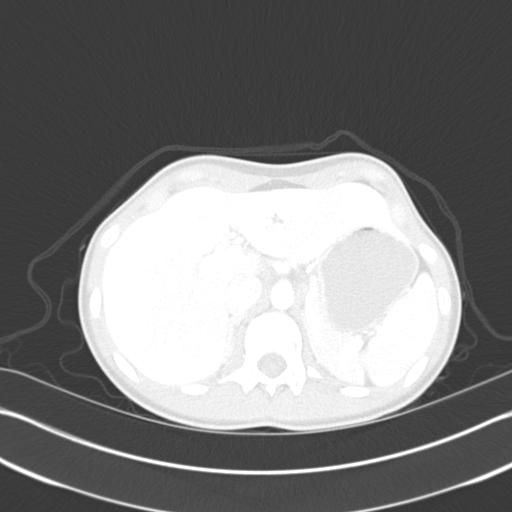
[im 123/148  soft-tissue]
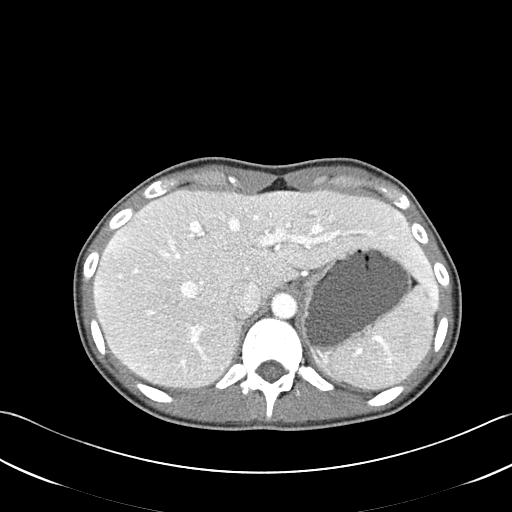
[im 123/148  lung]
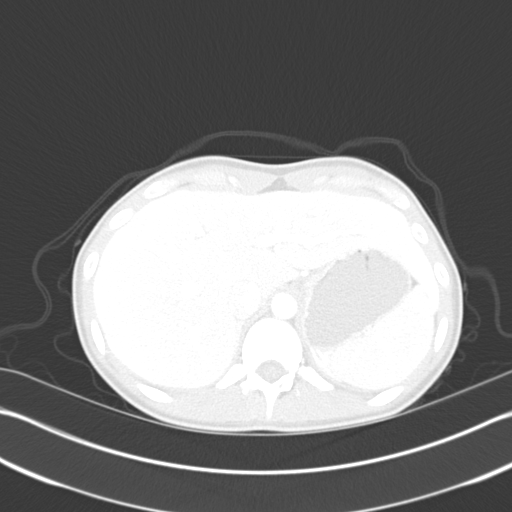
[im 131/148  lung]
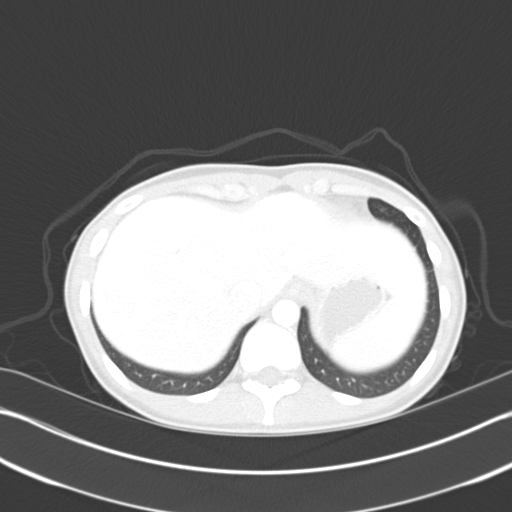
[im 139/148  soft-tissue]
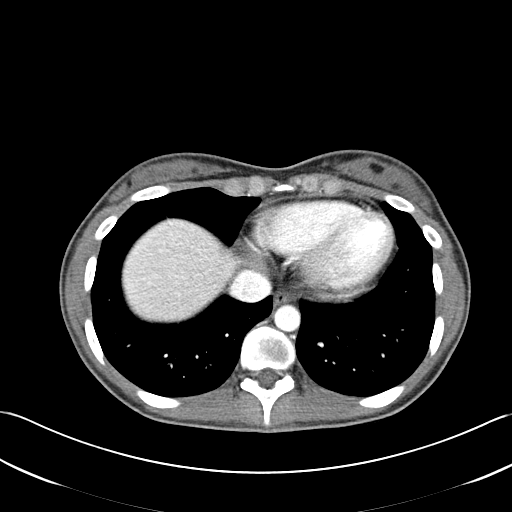
[im 139/148  lung]
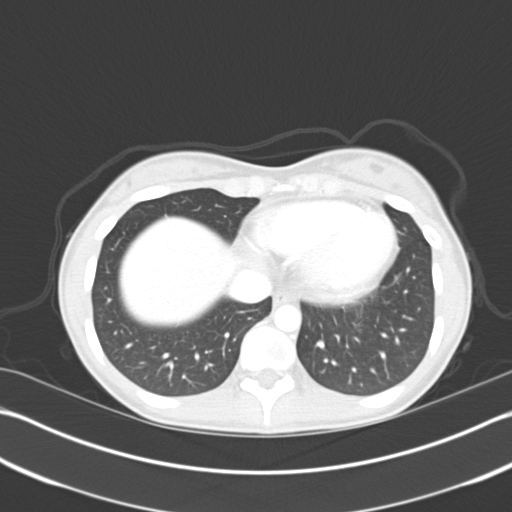

[13 of 32 positions shown; findings below may reference images not displayed]

FINDINGS: There is no abnormal inner wall or transmural enhancement
in the stomach.  No gastric wall thickening.  The duodenum is
normal.  Jejunal loops are not well distended with oral contrast
per otherwise normal in appearance.

In the right lower quadrant, abnormal small bowel loops are
identified.  The 7-10 cm of distal ileum shows abnormal enhancement
and the wall thickening.  This incorporates terminal ileum as it
enters the cecum.

The distal ileum just proximal to the abnormal segment is dilated,
indicating that the inflammatory changes in the terminal ileum
create an area of inflammatory stricturing.

A short 3-5 cm segment of inner wall hyperenhancement is seen in
the distal ileum about 25 cm proximal to the ileocecal valve.

A 2 cm abscess is identified in the mesentery of the right lower
quadrant, arising from the terminal ileum, about 5 cm proximal to
the ileocecal valve.  The abscess is tethered to the terminal ileum
and is completely surrounded by a small bowel loops.

No abnormal enhancement or wall thickening in the colon.

The liver and spleen are unremarkable.  Pancreas, gallbladder,
adrenal glands, and kidneys have normal imaging features.  No
abdominal aortic aneurysm.

Imaging through the pelvis shows a small amount of intraperitoneal
free fluid.  Bladder and uterus are unremarkable.  No adnexal mass.
Small lymph nodes are seen in the ileocolic mesentery.

Bone windows reveal no worrisome lytic or sclerotic osseous
lesions.
IMPRESSION: Abnormal enhancement and wall thickening involving approximately 10
cm of the terminal ileum. There are enhancing tracks within or just
superficial to the wall of the terminal ileum suggesting fistula
formation and a 2 cm abscess cavity arises from the terminal ileum
about 7 cm proximal to the ileocecal valve. It is possible that
this abscess could be in part of entero-enteric fistula, but I can
identify no bowel communication other than to the terminal ileum.

Inflammatory stricture identified approximately 10 cm from the
ileocecal valve without signs of significant obstruction at this
time.

Short segment of mucosal hyperenhancement approximately 25 cm
proximal to the ileocecal valve.  This is compatible with a second
area of small bowel involvement although there is no substantial
wall thickening or inflammatory stricturing at this level.

## 2015-08-03 NOTE — Telephone Encounter (Signed)
Called and left a message for patient to call back to schedule a new patient doctor referral. °

## 2015-08-04 MED FILL — BD INTEGRA SYR 3 ML 25GX5/8: 25G X 5/8" | 90 days supply | Qty: 4 | Fill #0

## 2015-08-18 ENCOUNTER — Encounter: Payer: Self-pay | Admitting: Obstetrics & Gynecology

## 2015-08-18 ENCOUNTER — Ambulatory Visit (INDEPENDENT_AMBULATORY_CARE_PROVIDER_SITE_OTHER): Payer: Commercial Managed Care - PPO | Admitting: Obstetrics & Gynecology

## 2015-08-18 VITALS — BP 120/80 | HR 72 | Resp 16 | Ht 63.0 in | Wt 139.0 lb

## 2015-08-18 DIAGNOSIS — Z01419 Encounter for gynecological examination (general) (routine) without abnormal findings: Secondary | ICD-10-CM

## 2015-08-18 DIAGNOSIS — Z124 Encounter for screening for malignant neoplasm of cervix: Secondary | ICD-10-CM | POA: Diagnosis not present

## 2015-08-18 DIAGNOSIS — Z0000 Encounter for general adult medical examination without abnormal findings: Secondary | ICD-10-CM

## 2015-08-18 LAB — POCT URINALYSIS DIPSTICK
Bilirubin, UA: NEGATIVE
Blood, UA: NEGATIVE
GLUCOSE UA: NEGATIVE
Ketones, UA: NEGATIVE
LEUKOCYTES UA: NEGATIVE
Nitrite, UA: NEGATIVE
PROTEIN UA: NEGATIVE
UROBILINOGEN UA: NEGATIVE
pH, UA: 5

## 2015-08-18 MED ORDER — DESOGESTREL-ETHINYL ESTRADIOL 0.15-0.02/0.01 MG (21/5) PO TABS: 1.0000 | ORAL_TABLET | Freq: Every day | ORAL | 4 refills | Status: DC

## 2015-08-18 NOTE — Progress Notes (Signed)
30 y.o. G0P0000 MarriedCaucasianF here for annual exam.  She is a new patient today.  On OCPs for contraception and cycle control.  Cycles last about 5 days but all of these are light.    H/O crohn's with b12 def and h/o iron deficiency.  Has had an iron transfusion with Dr. Marin Olp 1/15.  PCP:  Dr. Gwyneth Revels  Patient's last menstrual period was 07/30/2015 (exact date).          Sexually active: Yes.    The current method of family planning is OCP (estrogen/progesterone).    Exercising: No.  The patient does not participate in regular exercise at present. Smoker:  no  Health Maintenance: Pap:  08/20/12 Neg History of abnormal Pap:  no MMG:  04/20/12 [place] Bilateral BIRADS:Benign  Colonoscopy:  12/02/11 Normal  BMD:   Never TDaP:  01/20/2014  Pneumonia vaccine(s):  No Hep C testing: No Screening Labs: PCP, Urine today: Negative   reports that she has never smoked. She has never used smokeless tobacco. She reports that she does not drink alcohol or use drugs.  Past Medical History:  Diagnosis Date  . Anemia 11/2011   iron deficiency. 01/2013 parenteral iron. Intranasal B12.  Dr Marin Olp follows.   . B12 deficiency 11/2012  . Breast mass in female 04/21/2012  . Cervical cancer screening 01/27/2012  . Chicken pox as a child  . Crohn's disease ([place]) 2013   ileitis 10/2011. ileitis, mesenteric abscess, ? entero-enteric fistula on 08/2012 CT enterography  . GERD (gastroesophageal reflux disease)   . Hepatitis B immune 07/2012   previous vaccination.   Mindi Slicker 09/2014   per Dermatologist.  Rx Zyrtec, Benadryl.   . Small bowel obstruction ([place])   . Vitamin D deficiency 2014    Past Surgical History:  Procedure Laterality Date  . WISDOM TOOTH EXTRACTION  30 yrs old    Current Outpatient Prescriptions  Medication Sig Dispense Refill  . Adalimumab 40 MG/0.8ML PNKT as directed.    . Cetirizine HCl (ZYRTEC ALLERGY PO) Take by mouth as needed.     . cyanocobalamin (,VITAMIN B-12,) 1000  MCG/ML injection Inject 1000 mcg (1 mL) into the skin every 2 weeks for 2 doses, then inject 1000 mcg (1 mL) into the skin monthly. 10 mL 0  . desogestrel-ethinyl estradiol (VIORELE) 0.15-0.02/0.01 MG (21/5) tablet Take 1 tablet by mouth daily. 84 tablet 0  . diphenhydrAMINE (BENADRYL) 25 mg capsule Take 25 mg by mouth as needed for allergies.     . famotidine (PEPCID) 20 MG tablet Take 1 tablet (20 mg total) by mouth 2 (two) times daily. 60 tablet 3  . hyoscyamine (LEVSIN SL) 0.125 MG SL tablet Take 1-2 tablets by mouth every 6 hours as needed for spasm/pain 60 tablet 3  . predniSONE (DELTASONE) 10 MG tablet Take as directed 50 tablet 0  . Probiotic Product (MISC INTESTINAL FLORA REGULAT) CHEW Chew by mouth every other day. Digestive Health probiotic gummies by [employer]    . SYRINGE-NEEDLE, DISP, 3 ML 25G X 5/8" 3 ML MISC Use as directed for B12 injections. 50 each 0   No current facility-administered medications for this visit.     Family History  Problem Relation Age of Onset  . Nephrolithiasis Father   . Lung cancer Maternal Grandmother   . Gout Maternal Grandfather   . Ovarian cancer Paternal Grandmother   . Cancer Paternal Grandmother 77    ovarian  . Pancreatic cancer Paternal Grandfather   . Cancer Paternal Aunt  ovarian    ROS:  Pertinent items are noted in HPI.  Otherwise, a comprehensive ROS was negative.  Exam:   BP 120/80 (BP Location: Right Arm, Patient Position: Sitting, Cuff Size: Normal)   Pulse 72   Resp 16   Ht 5' 3"  (1.6 m)   Wt 139 lb (63 kg)   LMP 07/30/2015 (Exact Date)   BMI 24.62 kg/m     Height: 5' 3"  (160 cm)  Ht Readings from Last 3 Encounters:  08/18/15 5' 3"  (1.6 m)  07/22/15 5' 3"  (1.6 m)  07/21/15 5' 4"  (1.626 m)    General appearance: alert, cooperative and appears stated age Head: Normocephalic, without obvious abnormality, atraumatic Neck: no adenopathy, supple, symmetrical, trachea midline and thyroid normal to inspection and  palpation Lungs: clear to auscultation bilaterally Breasts: normal appearance, no masses or tenderness Heart: regular rate and rhythm Abdomen: soft, non-tender; bowel sounds normal; no masses,  no organomegaly Extremities: extremities normal, atraumatic, no cyanosis or edema Skin: Skin color, texture, turgor normal. No rashes or lesions Lymph nodes: Cervical, supraclavicular, and axillary nodes normal. No abnormal inguinal nodes palpated Neurologic: Grossly normal   Pelvic: External genitalia:  no lesions              Urethra:  normal appearing urethra with no masses, tenderness or lesions              Bartholins and Skenes: normal                 Vagina: normal appearing vagina with normal color and discharge, no lesions              Cervix: no lesions              Pap taken: Yes.   Bimanual Exam:  Uterus:  normal size, contour, position, consistency, mobility, non-tender              Adnexa: normal adnexa and no mass, fullness, tenderness               Rectovaginal: Confirms               Anus:  normal sphincter tone, no lesions  Chaperone was present for exam.  A:  Well Woman with normal exam H/o ovarian cancer in PGM and Paternal aunt age 40 at diagnosis.  Paternal aunt with positive genetic testing. Crohn's disease with SBO earlier 2017  On OCPs for contraception  P:   Mammogram guidelines discussed pap smear obtained today.  D/w pt doing this yearly due to immunocompromised status D/W pt genetic testing due to ovarian cancer family hx.  For the time being, she is going to wait on this.   RF for Viorele to pharmacy.  #3 month supply/4RF. return annually or prn

## 2015-08-20 LAB — IPS PAP TEST WITH REFLEX TO HPV

## 2015-08-21 ENCOUNTER — Telehealth: Payer: Self-pay | Admitting: *Deleted

## 2015-08-21 ENCOUNTER — Telehealth: Payer: Self-pay

## 2015-08-21 DIAGNOSIS — K50019 Crohn's disease of small intestine with unspecified complications: Secondary | ICD-10-CM

## 2015-08-21 NOTE — Telephone Encounter (Signed)
Pt dropped off Provider Screening Form.  Form completed. Pt notified.  Original placed up front for pick up.  Copy sent for scanning.

## 2015-08-21 NOTE — Telephone Encounter (Signed)
I have advised patient that she is due for TB Gold testing in August. I have asked that she come for this lab within the next 2 weeks. Orders have been place in EPIC. She verbalizes understanding.

## 2015-08-25 ENCOUNTER — Other Ambulatory Visit: Payer: Commercial Managed Care - PPO

## 2015-08-25 DIAGNOSIS — K50019 Crohn's disease of small intestine with unspecified complications: Secondary | ICD-10-CM

## 2015-08-27 LAB — QUANTIFERON TB GOLD ASSAY (BLOOD)
INTERFERON GAMMA RELEASE ASSAY: NEGATIVE
QUANTIFERON NIL VALUE: 0.03 [IU]/mL
QUANTIFERON TB AG MINUS NIL: 0.02 [IU]/mL

## 2015-08-28 ENCOUNTER — Other Ambulatory Visit: Payer: Commercial Managed Care - PPO

## 2015-08-28 ENCOUNTER — Ambulatory Visit: Payer: Commercial Managed Care - PPO | Admitting: Family

## 2015-08-29 ENCOUNTER — Other Ambulatory Visit: Payer: Self-pay | Admitting: Family Medicine

## 2015-09-03 ENCOUNTER — Other Ambulatory Visit (HOSPITAL_BASED_OUTPATIENT_CLINIC_OR_DEPARTMENT_OTHER): Payer: Commercial Managed Care - PPO

## 2015-09-03 ENCOUNTER — Ambulatory Visit (HOSPITAL_BASED_OUTPATIENT_CLINIC_OR_DEPARTMENT_OTHER): Payer: Commercial Managed Care - PPO | Admitting: Family

## 2015-09-03 ENCOUNTER — Encounter: Payer: Self-pay | Admitting: Family

## 2015-09-03 VITALS — BP 115/72 | HR 87 | Temp 98.1°F | Resp 18 | Ht 63.0 in | Wt 141.0 lb

## 2015-09-03 DIAGNOSIS — D508 Other iron deficiency anemias: Secondary | ICD-10-CM

## 2015-09-03 DIAGNOSIS — K50919 Crohn's disease, unspecified, with unspecified complications: Secondary | ICD-10-CM | POA: Diagnosis not present

## 2015-09-03 DIAGNOSIS — K50819 Crohn's disease of both small and large intestine with unspecified complications: Secondary | ICD-10-CM

## 2015-09-03 LAB — CBC WITH DIFFERENTIAL (CANCER CENTER ONLY)
BASO#: 0 10*3/uL (ref 0.0–0.2)
BASO%: 0.5 % (ref 0.0–2.0)
EOS%: 1.9 % (ref 0.0–7.0)
Eosinophils Absolute: 0.2 10*3/uL (ref 0.0–0.5)
HCT: 39.2 % (ref 34.8–46.6)
HGB: 12.9 g/dL (ref 11.6–15.9)
LYMPH#: 2.3 10*3/uL (ref 0.9–3.3)
LYMPH%: 29.4 % (ref 14.0–48.0)
MCH: 29.2 pg (ref 26.0–34.0)
MCHC: 32.9 g/dL (ref 32.0–36.0)
MCV: 89 fL (ref 81–101)
MONO#: 0.8 10*3/uL (ref 0.1–0.9)
MONO%: 9.9 % (ref 0.0–13.0)
NEUT#: 4.7 10*3/uL (ref 1.5–6.5)
NEUT%: 58.3 % (ref 39.6–80.0)
PLATELETS: 294 10*3/uL (ref 145–400)
RBC: 4.42 10*6/uL (ref 3.70–5.32)
RDW: 12.4 % (ref 11.1–15.7)
WBC: 8 10*3/uL (ref 3.9–10.0)

## 2015-09-03 NOTE — Progress Notes (Signed)
Hematology and Oncology Follow Up Visit  Carla Clark [id number] 1985/06/27 30 y.o. 09/03/2015   Principle Diagnosis:  Iron deficiency anemia  Current Therapy:   IV iron as indicated    Interim History:  [name] is here today for a follow-up. She is doing quite well and has no complaints at this time. She had a wonderful time in [place] with her husband and now she is getting ready for school to start.  She has not experienced a flare with her Chron's for quite a while. Her Hgb is stable at 12.9 with an MCV of 89.  No fever, chills, n/v, cough, rash, dizziness, SOB, chest pain, palpitations or abdominal pain. Her cycles continue to be normal and not heavy.  No swelling, tenderness, numbness or tingling in her extremities. No c/o joint aches or pains.  She has maintained a good appetite and is staying hydrated. Her weight is stable.   Medications:    Medication List       Accurate as of 09/03/15  3:00 PM. Always use your most recent med list.          Adalimumab 40 MG/0.8ML Pnkt as directed.   cyanocobalamin 1000 MCG/ML injection Commonly known as:  (VITAMIN B-12) Inject 1000 mcg (1 mL) into the skin every 2 weeks for 2 doses, then inject 1000 mcg (1 mL) into the skin monthly.   diphenhydrAMINE 25 mg capsule Commonly known as:  BENADRYL Take 25 mg by mouth as needed for allergies.   famotidine 20 MG tablet Commonly known as:  PEPCID Take 1 tablet (20 mg total) by mouth 2 (two) times daily.   hyoscyamine 0.125 MG SL tablet Commonly known as:  LEVSIN SL Take 1-2 tablets by mouth every 6 hours as needed for spasm/pain   Misc Intestinal Flora Regulat Chew Chew by mouth every other day. Digestive Health probiotic gummies by [employer]   predniSONE 10 MG tablet Commonly known as:  DELTASONE Take as directed   SYRINGE-NEEDLE (DISP) 3 ML 25G X 5/8" 3 ML Misc Use as directed for B12 injections.   VIORELE 0.15-0.02/0.01 MG (21/5) tablet Generic drug:   desogestrel-ethinyl estradiol TAKE 1 TABLET BY MOUTH EVERY DAY (NEED APPT)   ZYRTEC ALLERGY PO Take by mouth as needed.       Allergies: No Known Allergies  Past Medical History, Surgical history, Social history, and Family History were reviewed and updated.  Review of Systems: All other 10 point review of systems is negative.   Physical Exam:  vitals were not taken for this visit.  Wt Readings from Last 3 Encounters:  08/18/15 139 lb (63 kg)  07/22/15 139 lb 8 oz (63.3 kg)  07/21/15 138 lb 4 oz (62.7 kg)    Ocular: Sclerae unicteric, pupils equal, round and reactive to light Ear-nose-throat: Oropharynx clear, dentition fair Lymphatic: No cervical supraclavicular or axillary adenopathy Lungs no rales or rhonchi, good excursion bilaterally Heart regular rate and rhythm, no murmur appreciated Abd soft, nontender, positive bowel sounds, no liver or spleen tip palpated on exam MSK no focal spinal tenderness, no joint edema Neuro: non-focal, well-oriented, appropriate affect Breasts: Deferred  Lab Results  Component Value Date   WBC 8.0 09/03/2015   HGB 12.9 09/03/2015   HCT 39.2 09/03/2015   MCV 89 09/03/2015   PLT 294 09/03/2015   Lab Results  Component Value Date   FERRITIN 22 05/11/2015   IRON 100 05/11/2015   TIBC 310 05/11/2015   UIBC 210 05/11/2015   IRONPCTSAT  32 05/11/2015   Lab Results  Component Value Date   RETICCTPCT 0.3 (L) 01/20/2015   RBC 4.42 09/03/2015   RETICCTABS 13.7 (L) 01/20/2015   No results found for: KPAFRELGTCHN, LAMBDASER, KAPLAMBRATIO No results found for: IGGSERUM, IGA, IGMSERUM No results found for: Ronnald Ramp, A1GS, A2GS, Tillman Sers, SPEI   Chemistry      Component Value Date/Time   NA 137 07/22/2015 1447   K 3.8 07/22/2015 1447   CL 107 07/22/2015 1447   CO2 26 07/22/2015 1447   BUN 9 07/22/2015 1447   CREATININE 0.67 07/22/2015 1447   CREATININE 0.62 08/20/2012 1139      Component Value  Date/Time   CALCIUM 9.5 07/22/2015 1447   ALKPHOS 40 07/22/2015 1447   AST 15 07/22/2015 1447   ALT 14 07/22/2015 1447   BILITOT 0.3 07/22/2015 1447     Impression and Plan: [name] is a 30 year old white female with iron deficiency anemia secondary to Crohn's disease. She is doing well and has not had a Chron's flare in several months.  She is asymptomatic at this time. Her Hgb is stable at 12.0 with an MCV of 89.  We will see how her iron studies look and bring her back in next week for an infusion if needed.  We will plan to see her back in 4 months for labs and follow-up.  She will contact our office with any questions or concerns. We can certainly see her sooner if need be.   [name], NP 8/10/20173:00 PM

## 2015-09-04 ENCOUNTER — Encounter: Payer: Self-pay | Admitting: Nurse Practitioner

## 2015-09-04 LAB — FERRITIN: FERRITIN: 22 ng/mL (ref 9–269)

## 2015-09-04 LAB — IRON AND TIBC
%SAT: 21 % (ref 21–57)
IRON: 73 ug/dL (ref 41–142)
TIBC: 340 ug/dL (ref 236–444)
UIBC: 267 ug/dL (ref 120–384)

## 2015-09-04 LAB — RETICULOCYTES: Reticulocyte Count: 0.5 % — ABNORMAL LOW (ref 0.6–2.6)

## 2015-09-18 ENCOUNTER — Ambulatory Visit (HOSPITAL_BASED_OUTPATIENT_CLINIC_OR_DEPARTMENT_OTHER): Payer: Commercial Managed Care - PPO

## 2015-09-18 ENCOUNTER — Other Ambulatory Visit: Payer: Self-pay | Admitting: Family

## 2015-09-18 VITALS — BP 119/63 | HR 75 | Temp 98.2°F | Resp 16

## 2015-09-18 DIAGNOSIS — D508 Other iron deficiency anemias: Secondary | ICD-10-CM | POA: Diagnosis not present

## 2015-09-18 DIAGNOSIS — K50919 Crohn's disease, unspecified, with unspecified complications: Secondary | ICD-10-CM | POA: Diagnosis not present

## 2015-09-18 MED ORDER — SODIUM CHLORIDE 0.9 % IV SOLN
510.0000 mg | Freq: Once | INTRAVENOUS | Status: AC
  Administered 2015-09-18: 510 mg via INTRAVENOUS
  Filled 2015-09-18: qty 17

## 2015-09-18 MED ORDER — SODIUM CHLORIDE 0.9 % IV SOLN
Freq: Once | INTRAVENOUS | Status: AC
  Administered 2015-09-18: 13:00:00 via INTRAVENOUS

## 2015-09-18 NOTE — Patient Instructions (Signed)

## 2015-11-20 ENCOUNTER — Encounter: Payer: Self-pay | Admitting: Obstetrics & Gynecology

## 2015-11-20 ENCOUNTER — Ambulatory Visit (INDEPENDENT_AMBULATORY_CARE_PROVIDER_SITE_OTHER): Payer: Commercial Managed Care - PPO | Admitting: Obstetrics & Gynecology

## 2015-11-20 VITALS — BP 100/60 | HR 72 | Resp 14 | Ht 63.5 in | Wt 140.8 lb

## 2015-11-20 DIAGNOSIS — R21 Rash and other nonspecific skin eruption: Secondary | ICD-10-CM

## 2015-11-20 DIAGNOSIS — L292 Pruritus vulvae: Secondary | ICD-10-CM | POA: Diagnosis not present

## 2015-11-20 DIAGNOSIS — Z8719 Personal history of other diseases of the digestive system: Secondary | ICD-10-CM | POA: Insufficient documentation

## 2015-11-20 MED ORDER — CLOBETASOL PROPIONATE 0.05 % EX OINT
1.0000 | TOPICAL_OINTMENT | Freq: Two times a day (BID) | CUTANEOUS | 1 refills | Status: DC
Start: 2015-11-20 — End: 2016-08-22

## 2015-11-20 NOTE — Progress Notes (Signed)
GYNECOLOGY  VISIT   HPI: 30 y.o. G12P0000 Married Caucasian female here with complaint of vulvar itching for about three weeks.  She has used witch hazel externally.  Denies abnormal bleeding or discharge.  Denies urinary symptoms.  She has tried changed a few products to see if this will help.  Nothing has really helped.  Denies irregular bleeding.  No fevers.  Does not have skin issues but "is sensitive to a lot of things with fragrances".  GYNECOLOGIC HISTORY: Patient's last menstrual period was 11/13/2015. Contraception: OCPs--Viorele Hormonal therapy:  none  Patient Active Problem List   Diagnosis Date Noted  . SBO (small bowel obstruction)   . Exacerbation of Crohn's disease of small intestine ([place]) 04/17/2015  . Crohn's colitis ([place]) 04/17/2015  . Vaginitis and vulvovaginitis 01/21/2014  . Allergic dermatitis 11/05/2013  . Gastroesophageal reflux disease without esophagitis 09/10/2013  . Other iron deficiency anemia 01/21/2013  . Crohn's disease ([place]) 08/07/2012  . Breast mass in female 04/21/2012  . Cervical cancer screening 01/27/2012  . Preventative health care 11/06/2011    Past Medical History:  Diagnosis Date  . Anemia 11/2011   iron deficiency. 01/2013 parenteral iron. Intranasal B12.  Dr Marin Olp follows.   . B12 deficiency 11/2012  . Breast mass in female 04/21/2012  . Cervical cancer screening 01/27/2012  . Chicken pox as a child  . Crohn's disease ([place]) 2013   ileitis 10/2011. ileitis, mesenteric abscess, ? entero-enteric fistula on 08/2012 CT enterography  . GERD (gastroesophageal reflux disease)   . Hepatitis B immune 07/2012   previous vaccination.   Mindi Slicker 09/2014   per Dermatologist.  Rx Zyrtec, Benadryl.   . Small bowel obstruction   . Vitamin D deficiency 2014    Past Surgical History:  Procedure Laterality Date  . WISDOM TOOTH EXTRACTION  30 yrs old    MEDS:  Reviewed in EPIC and UTD  ALLERGIES: Review of patient's allergies indicates no known  allergies.  Family History  Problem Relation Age of Onset  . Nephrolithiasis Father   . Lung cancer Maternal Grandmother   . Gout Maternal Grandfather   . Ovarian cancer Paternal Grandmother   . Cancer Paternal Grandmother 61    ovarian  . Pancreatic cancer Paternal Grandfather   . Ovarian cancer Paternal Aunt     ovarian    SH:  Married, non smoker  Review of Systems  All other systems reviewed and are negative.   PHYSICAL EXAMINATION:    BP 100/60 (BP Location: Right Arm, Patient Position: Sitting, Cuff Size: Normal)   Pulse 72   Resp 14   Ht 5' 3.5" (1.613 m)   Wt 140 lb 12.8 oz (63.9 kg)   LMP 11/13/2015   BMI 24.55 kg/m     Physical Exam  Constitutional: She is oriented to person, place, and time. She appears well-developed and well-nourished.  GI: Soft. Bowel sounds are normal.  Genitourinary: Vagina normal.    There is rash on the right labia. There is no tenderness or injury on the right labia. There is no rash, tenderness, lesion or injury on the left labia.  Musculoskeletal: Normal range of motion.  Lymphadenopathy:       Right: No inguinal adenopathy present.       Left: No inguinal adenopathy present.  Neurological: She is alert and oriented to person, place, and time.  Skin: Skin is warm and dry.  Psychiatric: She has a normal mood and affect.   Chaperone was present for exam.  Assessment: Vulvar itching/irritation with skin changes consistent with allergy rxn  Plan: Start clobetasol 0.05% ointment BID.  Recheck in 2-3 weeks depending on response to steroids.

## 2015-11-23 ENCOUNTER — Telehealth: Payer: Self-pay | Admitting: *Deleted

## 2015-11-23 NOTE — Telephone Encounter (Signed)
-----   Message from Megan Salon, MD sent at 11/23/2015  1:31 PM EDT ----- Regarding: can you check on her Can you check on this pt and see if her itching is better.  If so, I would like to see her in three weeks.  If not, she probably needs a vulvar biopsy.  Thanks.  MSM

## 2015-11-23 NOTE — Telephone Encounter (Signed)
Message left to return call to Emily at [phone].    

## 2015-11-26 ENCOUNTER — Ambulatory Visit: Payer: Commercial Managed Care - PPO | Admitting: Internal Medicine

## 2015-11-27 NOTE — Telephone Encounter (Signed)
Message left to return call to Tyshay Adee at [phone].    

## 2015-11-27 NOTE — Telephone Encounter (Signed)
Returned call to patient. Patient states that the itching is significantly better than last Friday. Patient states she will still have itchy at "weird moments," but overall it is much better. RN offered to schedule 3 week follow up, but patient declined stating she would need to [place]. Will leave encounter open to schedule.   Routing to provider for review.

## 2015-11-27 NOTE — Telephone Encounter (Signed)
Patient returning your call.

## 2015-12-02 ENCOUNTER — Encounter: Payer: Self-pay | Admitting: Internal Medicine

## 2015-12-08 NOTE — Telephone Encounter (Signed)
Message left to return call to [facility] at [phone] to schedule 3 week follow up for week of (12/14/15). Patient did not return call to schedule.

## 2015-12-14 NOTE — Telephone Encounter (Signed)
Patient is returning a call to [place].

## 2015-12-15 NOTE — Telephone Encounter (Signed)
Returned call to patient. 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 Patient scheduled for Thursday 01/07/16 at 1445, but patient requests a later appointment. Dr. Sabra Heck- okay to schedule this patient in the 4 o'clock slot on Thursday 01/07/16 even though it is an ultrasound slot?   Routing to provider for review.

## 2015-12-16 NOTE — Telephone Encounter (Signed)
Why don't you make a slot for this pt at 4:15 so there isn't confusion about whether we can use the 4pm slot with an ultrasound?  Thanks.

## 2015-12-17 ENCOUNTER — Other Ambulatory Visit: Payer: Self-pay | Admitting: Family Medicine

## 2015-12-21 NOTE — Telephone Encounter (Signed)
Appointment changed to Thursday 01/07/16 at 1615 per Dr. Sabra Heck. Call to patient and left voicemail per DPR of new appointment time.   Routing to provider for final review. Patient agreeable to disposition. Will close encounter.

## 2015-12-31 ENCOUNTER — Telehealth: Payer: Self-pay | Admitting: Obstetrics & Gynecology

## 2015-12-31 NOTE — Telephone Encounter (Signed)
Left patient a message to call back to reschedule a future appointment that was cancelled by the provider on 01/07/16 for a recheck. (Dr. Sabra Heck)

## 2016-01-07 ENCOUNTER — Ambulatory Visit: Payer: Self-pay | Admitting: Obstetrics & Gynecology

## 2016-01-12 ENCOUNTER — Encounter: Payer: Self-pay | Admitting: Obstetrics & Gynecology

## 2016-01-12 ENCOUNTER — Ambulatory Visit (INDEPENDENT_AMBULATORY_CARE_PROVIDER_SITE_OTHER): Payer: Commercial Managed Care - PPO | Admitting: Obstetrics & Gynecology

## 2016-01-12 VITALS — BP 116/72 | HR 80 | Resp 14 | Ht 63.5 in | Wt 139.0 lb

## 2016-01-12 DIAGNOSIS — R21 Rash and other nonspecific skin eruption: Secondary | ICD-10-CM

## 2016-01-12 DIAGNOSIS — L299 Pruritus, unspecified: Secondary | ICD-10-CM

## 2016-01-12 NOTE — Patient Instructions (Signed)
Continue using the steroid ointment nightly for the next month.  Then you can stop.  If you have a "flare", please starting back twice daily for up to a week.  If you are still symptomatic, please call for an appointment.  If you are using the steroid frequently, please call.

## 2016-01-12 NOTE — Progress Notes (Signed)
GYNECOLOGY  VISIT   HPI: 30 y.o. [id number] Married Caucasian female here for follow up after being seen for vulvar irritation.  Pt had large area on right of mons that looked like atopic reaction.  She was treated with topical Clobetasol and symptoms have completely resolved.  She reports that she went on a trip over Thanksgiving and just "forgot" to use it.  Symptoms have not returned.  Also, wants me to check her right ear.  She's having fluid from the ear and wants me to look at it.  Also complains of itching in the canal.  GYNECOLOGIC HISTORY: Patient's last menstrual period was 12/28/2015. Contraception: OCPs  Patient Active Problem List   Diagnosis Date Noted  . History of small bowel obstruction 11/20/2015  . Allergic dermatitis 11/05/2013  . Gastroesophageal reflux disease without esophagitis 09/10/2013  . Other iron deficiency anemia 01/21/2013  . Crohn's disease ([place]) 08/07/2012  . Breast mass in female 04/21/2012  . Cervical cancer screening 01/27/2012  . Preventative health care 11/06/2011    Past Medical History:  Diagnosis Date  . Anemia 11/2011   iron deficiency. 01/2013 parenteral iron. Intranasal B12.  Dr Marin Olp follows.   . B12 deficiency 11/2012  . Breast mass in female 04/21/2012  . Cervical cancer screening 01/27/2012  . Chicken pox as a child  . Crohn's disease ([place]) 2013   ileitis 10/2011. ileitis, mesenteric abscess, ? entero-enteric fistula on 08/2012 CT enterography  . GERD (gastroesophageal reflux disease)   . Hepatitis B immune 07/2012   previous vaccination.   Mindi Slicker 09/2014   per Dermatologist.  Rx Zyrtec, Benadryl.   . Small bowel obstruction   . Vitamin D deficiency 2014    Past Surgical History:  Procedure Laterality Date  . WISDOM TOOTH EXTRACTION  30 yrs old    MEDS:  Reviewed in EPIC and UTD  ALLERGIES: Patient has no known allergies.  Family History  Problem Relation Age of Onset  . Nephrolithiasis Father   . Lung cancer Maternal  Grandmother   . Gout Maternal Grandfather   . Ovarian cancer Paternal Grandmother   . Cancer Paternal Grandmother 81    ovarian  . Pancreatic cancer Paternal Grandfather   . Ovarian cancer Paternal Aunt     ovarian  . Other Paternal Uncle 36    cyst removed from pancreas    SH:  Married, non smoker  Review of Systems  All other systems reviewed and are negative.   PHYSICAL EXAMINATION:    BP 116/72 (BP Location: Right Arm, Patient Position: Sitting, Cuff Size: Normal)   Pulse 80   Resp 14   Ht 5' 3.5" (1.613 m)   Wt 139 lb (63 kg)   LMP 12/28/2015   BMI 24.24 kg/m     General appearance: alert, cooperative and appears stated age Neck: no adenopathy Ear:  Right ear with canal erythema consistent with atopic reaction  Pelvic: External genitalia: resolution of prior findings.  Now slight hypopigmentation noted on superior labia minora (pt confirms she does sometimes have itching in this area)              Urethra:  normal appearing urethra with no masses, tenderness or lesions              Bartholins and Skenes: normal                 Anus:  no lesions  Chaperone was present for exam.  Assessment: Vulvar atopic reaction, resolved with topical  steroid Similar finding in ear canal  Plan: Pt given instructions for tapering off steroid As I am unfamiliar with what to use in the ear, she is going to call her dermatologist for recommendations.

## 2016-01-13 ENCOUNTER — Other Ambulatory Visit (HOSPITAL_BASED_OUTPATIENT_CLINIC_OR_DEPARTMENT_OTHER): Payer: Commercial Managed Care - PPO

## 2016-01-13 ENCOUNTER — Ambulatory Visit (HOSPITAL_BASED_OUTPATIENT_CLINIC_OR_DEPARTMENT_OTHER): Payer: Commercial Managed Care - PPO | Admitting: Family

## 2016-01-13 VITALS — BP 140/56 | HR 68 | Temp 97.8°F | Resp 20 | Wt 140.0 lb

## 2016-01-13 DIAGNOSIS — D500 Iron deficiency anemia secondary to blood loss (chronic): Secondary | ICD-10-CM

## 2016-01-13 DIAGNOSIS — K5090 Crohn's disease, unspecified, without complications: Secondary | ICD-10-CM

## 2016-01-13 DIAGNOSIS — D508 Other iron deficiency anemias: Secondary | ICD-10-CM | POA: Diagnosis not present

## 2016-01-13 DIAGNOSIS — R5383 Other fatigue: Secondary | ICD-10-CM | POA: Diagnosis not present

## 2016-01-13 DIAGNOSIS — K50819 Crohn's disease of both small and large intestine with unspecified complications: Secondary | ICD-10-CM

## 2016-01-13 LAB — CBC WITH DIFFERENTIAL (CANCER CENTER ONLY)
BASO#: 0 10*3/uL (ref 0.0–0.2)
BASO%: 0.6 % (ref 0.0–2.0)
EOS%: 4 % (ref 0.0–7.0)
Eosinophils Absolute: 0.3 10*3/uL (ref 0.0–0.5)
HEMATOCRIT: 39.1 % (ref 34.8–46.6)
HGB: 13.2 g/dL (ref 11.6–15.9)
LYMPH#: 2.6 10*3/uL (ref 0.9–3.3)
LYMPH%: 39.6 % (ref 14.0–48.0)
MCH: 29.7 pg (ref 26.0–34.0)
MCHC: 33.8 g/dL (ref 32.0–36.0)
MCV: 88 fL (ref 81–101)
MONO#: 0.6 10*3/uL (ref 0.1–0.9)
MONO%: 9.2 % (ref 0.0–13.0)
NEUT#: 3 10*3/uL (ref 1.5–6.5)
NEUT%: 46.6 % (ref 39.6–80.0)
Platelets: 243 10*3/uL (ref 145–400)
RBC: 4.44 10*6/uL (ref 3.70–5.32)
RDW: 12.7 % (ref 11.1–15.7)
WBC: 6.4 10*3/uL (ref 3.9–10.0)

## 2016-01-13 LAB — FERRITIN: Ferritin: 86 ng/ml (ref 9–269)

## 2016-01-13 LAB — IRON AND TIBC
%SAT: 41 % (ref 21–57)
Iron: 117 ug/dL (ref 41–142)
TIBC: 281 ug/dL (ref 236–444)
UIBC: 165 ug/dL (ref 120–384)

## 2016-01-13 NOTE — Progress Notes (Signed)
Hematology and Oncology Follow Up Visit  Carla Clark [id number] [birth year] 30 y.o. 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 She states that she has not had a recent Chron's flare but did eat a greasy burger last week and has noticed some blood on her toilet tissue the last few days. She is still on [name]  She has noticed some increased fatigue. Her cycles have been regular and not heavy.  She last received IV iron in August. CBC today is stable. Iron studies pending.  She has noticed some dermatitis around her mouth and ears. She spoke with her gynecologist and will request a referral back to dermatology from her PCP.  No fever, chills, n/v, cough, rash, dizziness, SOB, chest pain, palpitations or changes in bladder habits.  No swelling, tenderness, numbness or tingling in her extremities. No c/o joint aches or pain.  She has maintained a good appetite and is staying well hydrated. Her weight is stable.   Medications:  Allergies as of 01/13/2016   No Known Allergies     Medication List       Accurate as of 01/13/16 10:32 AM. Always use your most recent med list.          Adalimumab 40 MG/0.8ML Pnkt as directed.   clobetasol ointment 0.05 % Commonly known as:  TEMOVATE Apply 1 application topically 2 (two) times daily. Apply as directed twice daily   cyanocobalamin 1000 MCG/ML injection Commonly known as:  (VITAMIN B-12) Inject 1000 mcg (1 mL) into the skin every 2 weeks for 2 doses, then inject 1000 mcg (1 mL) into the skin monthly.   diphenhydrAMINE 25 mg capsule Commonly known as:  BENADRYL Take 25 mg by mouth as needed for allergies.   famotidine 20 MG tablet Commonly known as:  PEPCID Take 1 tablet (20 mg total) by mouth 2 (two) times daily.     hyoscyamine 0.125 MG SL tablet Commonly known as:  LEVSIN SL Take 1-2 tablets by mouth every 6 hours as needed for spasm/pain   Misc Intestinal Flora Regulat Chew Chew by mouth every other day. Digestive Health probiotic gummies by [employer]   SYRINGE-NEEDLE (DISP) 3 ML 25G X 5/8" 3 ML Misc Use as directed for B12 injections.   VIORELE 0.15-0.02/0.01 MG (21/5) tablet Generic drug:  desogestrel-ethinyl estradiol TAKE 1 TABLET BY MOUTH EVERY DAY (NEED APPT)   ZYRTEC ALLERGY PO Take by mouth as needed.       Allergies: No Known Allergies  Past Medical History, Surgical history, Social history, and Family History were reviewed and updated.  Review of Systems: All other 10 point review of systems is negative.   Physical Exam:  vitals were not taken for this visit.  Wt Readings from Last 3 Encounters:  01/12/16 139 lb (63 kg)  11/20/15 140 lb 12.8 oz (63.9 kg)  09/03/15 141 lb (64 kg)    Ocular: Sclerae unicteric, pupils equal, round and reactive to light Ear-nose-throat: Oropharynx clear, dentition fair Lymphatic: No cervical supraclavicular or axillary adenopathy Lungs no rales or rhonchi, good excursion bilaterally Heart regular rate and rhythm, no murmur appreciated Abd soft, nontender, positive bowel sounds, no liver or spleen tip palpated on exam MSK no focal spinal tenderness, no joint edema Neuro: non-focal, well-oriented, appropriate affect  Breasts: Deferred  Lab Results  Component Value Date   WBC 8.0 09/03/2015   HGB 12.9 09/03/2015   HCT 39.2 09/03/2015   MCV 89 09/03/2015   PLT 294 09/03/2015   Lab Results  Component Value Date   FERRITIN 22 09/03/2015   IRON 73 09/03/2015   TIBC 340 09/03/2015   UIBC 267 09/03/2015   IRONPCTSAT 21 09/03/2015   Lab Results  Component Value Date   RETICCTPCT 0.3 (L) 01/20/2015   RBC 4.42 09/03/2015   RETICCTABS 13.7 (L) 01/20/2015   No results found for: KPAFRELGTCHN, LAMBDASER, KAPLAMBRATIO No results found  for: IGGSERUM, IGA, IGMSERUM No results found for: Ronnald Ramp, A1GS, A2GS, Tillman Sers, SPEI   Chemistry      Component Value Date/Time   NA 137 07/22/2015 1447   K 3.8 07/22/2015 1447   CL 107 07/22/2015 1447   CO2 26 07/22/2015 1447   BUN 9 07/22/2015 1447   CREATININE 0.67 07/22/2015 1447   CREATININE 0.62 08/20/2012 1139      Component Value Date/Time   CALCIUM 9.5 07/22/2015 1447   ALKPHOS 40 07/22/2015 1447   AST 15 07/22/2015 1447   ALT 14 07/22/2015 1447   BILITOT 0.3 07/22/2015 1447     Impression and Plan: Carla Clark is a 37 white female with iron deficiency anemia secondary to Crohn's disease. She has noticed a little blood on her toilet tissue after eating a greasy burger last week and is watching her diet. She has had some increased fatigue.  We will see how her iron studies look and bring her back in later this week for an infusion if needed.  We will go ahead and plan to see her back in 4 months for lab work and follow-up.  She will contact our office with any questions or concerns. We can certainly see her sooner if need be.   [name], NP 12/20/201710:32 AM

## 2016-01-14 ENCOUNTER — Telehealth: Payer: Self-pay | Admitting: *Deleted

## 2016-01-14 LAB — RETICULOCYTES: Reticulocyte Count: 0.5 % — ABNORMAL LOW (ref 0.6–2.6)

## 2016-01-14 NOTE — Telephone Encounter (Addendum)
Patient is aware of results  ----- Message from [name], NP sent at 01/13/2016  3:55 PM EST ----- Regarding: Iron  Iron looks good. No infusion! Thanks!  [place]  ----- Message ----- From: Interface, Lab In Three Zero One Sent: 01/13/2016  10:46 AM To: [name], NP

## 2016-02-25 ENCOUNTER — Other Ambulatory Visit: Payer: Self-pay

## 2016-02-25 ENCOUNTER — Telehealth: Payer: Self-pay | Admitting: Internal Medicine

## 2016-02-25 MED ORDER — HYDROCORTISONE ACETATE 25 MG RE SUPP: 25.0000 mg | Freq: Two times a day (BID) | RECTAL | 0 refills | Status: DC

## 2016-02-25 MED FILL — ANUCORT-HC 25 MG SUPPOSITOR: 25 | 5 days supply | Qty: 10 | Fill #0

## 2016-02-25 NOTE — Telephone Encounter (Signed)
Left message for pt to call back, script sent to pharmacy.  Pt aware.

## 2016-02-25 NOTE — Telephone Encounter (Signed)
Hydrocortisone suppository 25 mg twice a day 5 days OTC RectiCare can be used for pain/itching per box instructions Call if not better after treatment

## 2016-02-25 NOTE — Telephone Encounter (Signed)
Pt states that for the past 3 weeks she has been having issues with hemorrhoids. She reports itching, pain, bleeding, and she can tell there is inflammation. She has tried OTC hemorrhoid meds for 2 weeks but they have not helped. Please advise.

## 2016-03-08 ENCOUNTER — Encounter: Payer: Self-pay | Admitting: Internal Medicine

## 2016-03-08 ENCOUNTER — Ambulatory Visit (INDEPENDENT_AMBULATORY_CARE_PROVIDER_SITE_OTHER): Payer: Commercial Managed Care - PPO | Admitting: Internal Medicine

## 2016-03-08 VITALS — BP 110/78 | HR 72 | Ht 63.5 in | Wt 137.0 lb

## 2016-03-08 DIAGNOSIS — E538 Deficiency of other specified B group vitamins: Secondary | ICD-10-CM | POA: Diagnosis not present

## 2016-03-08 DIAGNOSIS — K219 Gastro-esophageal reflux disease without esophagitis: Secondary | ICD-10-CM

## 2016-03-08 DIAGNOSIS — Z8639 Personal history of other endocrine, nutritional and metabolic disease: Secondary | ICD-10-CM

## 2016-03-08 DIAGNOSIS — K602 Anal fissure, unspecified: Secondary | ICD-10-CM

## 2016-03-08 DIAGNOSIS — K50019 Crohn's disease of small intestine with unspecified complications: Secondary | ICD-10-CM | POA: Diagnosis not present

## 2016-03-08 DIAGNOSIS — K130 Diseases of lips: Secondary | ICD-10-CM | POA: Diagnosis not present

## 2016-03-08 MED ORDER — DILTIAZEM GEL 2 %: 1.0000 "application " | Freq: Two times a day (BID) | CUTANEOUS | 0 refills | Status: DC

## 2016-03-08 NOTE — Patient Instructions (Addendum)
Your physician has requested that you go to the basement for the following lab work before leaving today: CMP, TSH, Celiac, B12, Folate, Thiamine, Niacin  Continue Humira.  We have sent a prescription for diltiazem gel to [facility]. You should apply twice daily to your rectum x 4-6 weeks.  [facility]'s information is below: Address: [address], [place], [place] [address]  [phone]   You have been scheduled for follow up with Dr Hilarie Fredrickson on Tuesday, 04/26/16 @ 10:30 am.  If you are age 38 or older, your body mass index should be between 23-30. Your Body mass index is 23.89 kg/m. If this is out of the aforementioned range listed, please consider follow up with your Primary Care Provider.  If you are age 28 or younger, your body mass index should be between 19-25. Your Body mass index is 23.89 kg/m. If this is out of the aformentioned range listed, please consider follow up with your Primary Care Provider.

## 2016-03-08 NOTE — Progress Notes (Signed)
Subjective:    Patient ID: Carla Clark, female    DOB: 1985/03/05, 31 y.o.   MRN: [id number]  HPI [name] is a 31 year old female with history of ileal Crohn's disease diagnosed in 2013, history of small bowel obstruction related to Crohn's disease in 2017, history of iron deficiency anemia who is here for follow-up. She was last seen in June 2017. She's been maintained on Humira 40 mg every 14 days.  She reports over the last 2 weeks she's been dealing with perianal pain and irritation initially felt to be hemorrhoids. She was using over-the-counter Preparation H areas she's noticed blood on the tissue as well as pain with passing stool. She's also had perianal itching and irritation. She was prescribed hydrocortisone suppository which she didn't start because she was not convinced this was hemorrhoids.   Stools have been formed and regular occurring once per day. No diarrhea or melena. No fevers or chills. She has "twinges" of abdominal pain in the right abdomen which come and go and do not last long. No obstructive symptoms. No nausea or vomiting. Appetite has been good. She's dealing with work-related stress. Reflux and indigestion has been well controlled on Pepcid 20 mg once daily. No dysphagia.  She was taking B12 injection initially starting in the summer of 2017 when she was found to be B12 deficient. She has missed this injection last month and feels her last injection was in January.  She reports skin irritation at the edges of her lips bilaterally. She's been using Chapstick without improvement. No oral ulcers. No new joint pains or ocular complaint.  Review of Systems As per history of present illness, otherwise negative  Current Medications, Allergies, Past Medical History, Past Surgical History, Family History and Social History were reviewed in [employer] record.     Objective:   Physical Exam BP 110/78   Pulse 72   Ht 5' 3.5" (1.613 m)   Wt  137 lb (62.1 kg)   BMI 23.89 kg/m  Constitutional: Well-developed and well-nourished. No distress. HEENT: Normocephalic and atraumatic. Oropharynx is clear and moist. No oropharyngeal exudate. Conjunctivae are normal.  No scleral icterus. Maceration, scaling erythema at b/l corner of mouth with some and fissuring Neck: Neck supple. Trachea midline. Cardiovascular: Normal rate, regular rhythm and intact distal pulses. No M/R/G Pulmonary/chest: Effort normal and breath sounds normal. No wheezing, rales or rhonchi. Abdominal: Soft, mild right middle and lower quadrant tenderness without rebound or guarding, nondistended. Bowel sounds active throughout. There are no masses palpable. No hepatosplenomegaly. Rectal: No external hemorrhoids, posterior anal fissure noted, internal exam not performed with fissure present, no perianal rash swelling or fluctuance, no evidence of fistula Extremities: no clubbing, cyanosis, or edema Lymphadenopathy: No cervical adenopathy noted. Neurological: Alert and oriented to person place and time. Skin: Skin is warm and dry. Psychiatric: Normal mood and affect. Behavior is normal.     Assessment & Plan:  31 year old female with history of ileal Crohn's disease diagnosed in 2013, history of small bowel obstruction related to Crohn's disease in 2017, history of iron deficiency anemia who is here for follow-up.  1. Crohn's ileitis -- ileitis is been well controlled over the last 6-8 months. She's been on Humira 40 mg every 14 days. We'll continue with this therapy. She requests follow-up appointment with her husband to discuss Crohn's management during pregnancy. She and her husband are considering beginning a family. She is currently taking oral contraceptive pill but may stop in the next  several months. We will schedule follow-up to discuss Crohn's, pregnancy and management. No obstructive symptoms despite history of small bowel obstruction in 2017.  2. Anal fissure --  uncomplicated anal fissure. Diltiazem gel 2% twice a day 3-6 weeks. Call if not improving.  3. Angular cheilitis -- petroleum ointment as a barrier protection 3-4 times daily until healed. Check folate, thiamine and niacin level  4. History of B12 deficiency -- check B12. I encouraged her to resume IM B12 monthly  5. GERD/indigestion -- well-controlled famotidine 20 mg each morning. Continue this dose  6. History of iron deficiency anemia -- followed by hematology.  As a result of IBD. Recent CBC and iron studies normal.  40 minutes spent with the patient today. Greater than 50% was spent in counseling and coordination of care with the patient

## 2016-03-11 ENCOUNTER — Other Ambulatory Visit (INDEPENDENT_AMBULATORY_CARE_PROVIDER_SITE_OTHER): Payer: Commercial Managed Care - PPO

## 2016-03-11 DIAGNOSIS — K50019 Crohn's disease of small intestine with unspecified complications: Secondary | ICD-10-CM

## 2016-03-11 LAB — COMPREHENSIVE METABOLIC PANEL
ALBUMIN: 4.2 g/dL (ref 3.5–5.2)
ALK PHOS: 43 U/L (ref 39–117)
ALT: 14 U/L (ref 0–35)
AST: 14 U/L (ref 0–37)
BUN: 7 mg/dL (ref 6–23)
CHLORIDE: 106 meq/L (ref 96–112)
CO2: 26 mEq/L (ref 19–32)
Calcium: 9.4 mg/dL (ref 8.4–10.5)
Creatinine, Ser: 0.72 mg/dL (ref 0.40–1.20)
GFR: 102.12 mL/min (ref >60.00)
Glucose, Bld: 103 mg/dL — ABNORMAL HIGH (ref 70–99)
POTASSIUM: 4.1 meq/L (ref 3.5–5.1)
Sodium: 138 mEq/L (ref 135–145)
TOTAL PROTEIN: 7.1 g/dL (ref 6.0–8.3)
Total Bilirubin: 0.5 mg/dL (ref 0.2–1.2)

## 2016-03-11 LAB — TSH: TSH: 1.59 u[IU]/mL (ref 0.35–4.50)

## 2016-03-11 LAB — VITAMIN B12: Vitamin B-12: 217 pg/mL (ref 211–911)

## 2016-03-11 LAB — IGA: IGA: 187 mg/dL (ref 68–378)

## 2016-03-11 LAB — FOLATE: FOLATE: 22.4 ng/mL (ref >5.9)

## 2016-03-14 LAB — TISSUE TRANSGLUTAMINASE, IGA: Tissue Transglutaminase Ab, IgA: 1 U/mL

## 2016-03-16 LAB — VITAMIN B1: Vitamin B1 (Thiamine): 9 nmol/L (ref 8–30)

## 2016-03-17 LAB — VITAMIN B3

## 2016-04-01 ENCOUNTER — Encounter: Payer: Self-pay | Admitting: Family Medicine

## 2016-04-01 ENCOUNTER — Telehealth: Payer: Self-pay | Admitting: Internal Medicine

## 2016-04-01 NOTE — Telephone Encounter (Signed)
Pt states the diltiazem gel is not helping. States that with every bowel movement she has bleeding. Pt calling to see what else she can try. Please advise.

## 2016-04-04 ENCOUNTER — Other Ambulatory Visit: Payer: Self-pay

## 2016-04-04 MED ORDER — AMBULATORY NON FORMULARY MEDICATION: 3 refills | Status: DC

## 2016-04-04 NOTE — Telephone Encounter (Signed)
Script sent to pharmacy and flex sig scheduled for after OV 05/04/16@3 :30pm. Pt aware.

## 2016-04-04 NOTE — Telephone Encounter (Signed)
Change to NTG ointment 2% TID to anal canal Stop diltiazem Schedule flex sig in about 3-4 weeks which can be canceled if symptoms have resolved by that time.  With her Crohn's disease hx, if symptoms persist or do not respond to treatment other pathology needs to be excluded Thanks

## 2016-04-15 ENCOUNTER — Other Ambulatory Visit (HOSPITAL_BASED_OUTPATIENT_CLINIC_OR_DEPARTMENT_OTHER): Payer: Commercial Managed Care - PPO

## 2016-04-15 ENCOUNTER — Encounter: Payer: Self-pay | Admitting: Family

## 2016-04-15 ENCOUNTER — Ambulatory Visit (HOSPITAL_BASED_OUTPATIENT_CLINIC_OR_DEPARTMENT_OTHER): Payer: Commercial Managed Care - PPO | Admitting: Family

## 2016-04-15 VITALS — BP 110/63 | HR 72 | Temp 97.9°F | Resp 17 | Wt 132.0 lb

## 2016-04-15 DIAGNOSIS — D508 Other iron deficiency anemias: Secondary | ICD-10-CM

## 2016-04-15 DIAGNOSIS — K50819 Crohn's disease of both small and large intestine with unspecified complications: Secondary | ICD-10-CM

## 2016-04-15 DIAGNOSIS — K50918 Crohn's disease, unspecified, with other complication: Secondary | ICD-10-CM

## 2016-04-15 DIAGNOSIS — D500 Iron deficiency anemia secondary to blood loss (chronic): Secondary | ICD-10-CM

## 2016-04-15 LAB — CBC WITH DIFFERENTIAL (CANCER CENTER ONLY)
BASO#: 0 10*3/uL (ref 0.0–0.2)
BASO%: 0.6 % (ref 0.0–2.0)
EOS%: 2.9 % (ref 0.0–7.0)
Eosinophils Absolute: 0.2 10*3/uL (ref 0.0–0.5)
HCT: 39.7 % (ref 34.8–46.6)
HGB: 12.8 g/dL (ref 11.6–15.9)
LYMPH#: 2.9 10*3/uL (ref 0.9–3.3)
LYMPH%: 40.1 % (ref 14.0–48.0)
MCH: 29 pg (ref 26.0–34.0)
MCHC: 32.2 g/dL (ref 32.0–36.0)
MCV: 90 fL (ref 81–101)
MONO#: 0.7 10*3/uL (ref 0.1–0.9)
MONO%: 10 % (ref 0.0–13.0)
NEUT%: 46.4 % (ref 39.6–80.0)
NEUTROS ABS: 3.3 10*3/uL (ref 1.5–6.5)
PLATELETS: 306 10*3/uL (ref 145–400)
RBC: 4.41 10*6/uL (ref 3.70–5.32)
RDW: 12.9 % (ref 11.1–15.7)
WBC: 7.2 10*3/uL (ref 3.9–10.0)

## 2016-04-15 NOTE — Progress Notes (Signed)
Hematology and Oncology Follow Up Visit  Carla Clark [id number] 17-May-1985 31 y.o. 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 She is doing fairly well. She has an anal fissure due to straining with constipation. She has prescription ointment that she is using on it daily and this seems to be helping.  She has occasional cramping and bloating which is not unusual for her. She is careful with her diet and avoids foods that trigger flares. She is still on [name] but may be switching to a medication that is not harmful while pregnant incase she and her husband decide to expand their family.  She last received IV iron in August 2017. No episodes of bleeding, bruising or petechiae.  No fever, chills, n/v, cough, rash, dizziness, SOB, chest pain, palpitations or changes in bladder habits.  No lymphadenopathy found on exam.  No swelling, tenderness, numbness or tingling in her extremities. No c/o joint aches or pain.  She has maintained a good appetite and is staying well hydrated. Her weight is stable.   Medications:  Allergies as of 04/15/2016   No Known Allergies     Medication List       Accurate as of 04/15/16  2:14 PM. Always use your most recent med list.          Adalimumab 40 MG/0.8ML Pnkt every 14 (fourteen) days.   AMBULATORY NON FORMULARY MEDICATION Nitroglycerin ointment .02% Apply pea sized amount to the anal canal TID   clobetasol ointment 0.05 % Commonly known as:  TEMOVATE Apply 1 application topically 2 (two) times daily. Apply as directed twice daily   cyanocobalamin 1000 MCG/ML injection Commonly known as:  (VITAMIN B-12) Inject 1000 mcg (1 mL) into the skin every 2 weeks for 2 doses, then inject 1000 mcg (1 mL) into the skin monthly.   diltiazem 2 %  Gel Apply 1 application topically 2 (two) times daily.   diphenhydrAMINE 25 mg capsule Commonly known as:  BENADRYL Take 25 mg by mouth as needed for allergies.   famotidine 20 MG tablet Commonly known as:  PEPCID Take 1 tablet (20 mg total) by mouth 2 (two) times daily.   hydrocortisone 25 MG suppository Commonly known as:  ANUSOL-HC Place 1 suppository (25 mg total) rectally 2 (two) times daily.   hyoscyamine 0.125 MG SL tablet Commonly known as:  LEVSIN SL Take 1-2 tablets by mouth every 6 hours as needed for spasm/pain   Misc Intestinal Flora Regulat Chew Chew by mouth every other day. Digestive Health probiotic gummies by [employer]   SYRINGE-NEEDLE (DISP) 3 ML 25G X 5/8" 3 ML Misc Use as directed for B12 injections.   VIORELE 0.15-0.02/0.01 MG (21/5) tablet Generic drug:  desogestrel-ethinyl estradiol TAKE 1 TABLET BY MOUTH EVERY DAY (NEED APPT)   ZYRTEC ALLERGY PO Take by mouth as needed.       Allergies: No Known Allergies  Past Medical History, Surgical history, Social history, and Family History were reviewed and updated.  Review of Systems: All other 10 point review of systems is negative.   Physical Exam:  vitals were not taken for this visit.  Wt Readings from Last 3 Encounters:  03/08/16 137 lb (62.1 kg)  01/13/16 140 lb (63.5 kg)  01/12/16 139 lb (63 kg)  Ocular: Sclerae unicteric, pupils equal, round and reactive to light Ear-nose-throat: Oropharynx clear, dentition fair Lymphatic: No cervical supraclavicular or axillary adenopathy Lungs no rales or rhonchi, good excursion bilaterally Heart regular rate and rhythm, no murmur appreciated Abd soft, nontender, positive bowel sounds, no liver or spleen tip palpated on exam, no fluid wave MSK no focal spinal tenderness, no joint edema Neuro: non-focal, well-oriented, appropriate affect Breasts: Deferred  Lab Results  Component Value Date   WBC 6.4 01/13/2016   HGB 13.2 01/13/2016   HCT 39.1  01/13/2016   MCV 88 01/13/2016   PLT 243 01/13/2016   Lab Results  Component Value Date   FERRITIN 86 01/13/2016   IRON 117 01/13/2016   TIBC 281 01/13/2016   UIBC 165 01/13/2016   IRONPCTSAT 41 01/13/2016   Lab Results  Component Value Date   RETICCTPCT 0.3 (L) 01/20/2015   RBC 4.44 01/13/2016   RETICCTABS 13.7 (L) 01/20/2015   No results found for: KPAFRELGTCHN, LAMBDASER, [facility] Lab Results  Component Value Date   IGA 187 03/11/2016   No results found for: [name], SPEI   Chemistry      Component Value Date/Time   NA 138 03/11/2016 1145   K 4.1 03/11/2016 1145   CL 106 03/11/2016 1145   CO2 26 03/11/2016 1145   BUN 7 03/11/2016 1145   CREATININE 0.72 03/11/2016 1145   CREATININE 0.62 08/20/2012 1139      Component Value Date/Time   CALCIUM 9.4 03/11/2016 1145   ALKPHOS 43 03/11/2016 1145   AST 14 03/11/2016 1145   ALT 14 03/11/2016 1145   BILITOT 0.5 03/11/2016 1145     Impression and Plan: Carla Clark is a 31 white female with iron deficiency anemia secondary to Crohn's disease. She continues to do well and is asymptomatic at this time. Hgb is stable at 12.8 with an MCV of 90.   We will see how her iron studies look and bring her back in next week for an infusion if needed.  We will go ahead and plan to see her back in 4 months for lab work and follow-up.  She will contact our office with any questions or concerns. We can certainly see her sooner if need be.   [name], NP 3/23/20182:14 PM

## 2016-04-16 LAB — RETICULOCYTES: RETICULOCYTE COUNT: 0.6 % (ref 0.6–2.6)

## 2016-04-18 ENCOUNTER — Telehealth: Payer: Self-pay | Admitting: *Deleted

## 2016-04-18 LAB — IRON AND TIBC
%SAT: 28 % (ref 21–57)
Iron: 82 ug/dL (ref 41–142)
TIBC: 289 ug/dL (ref 236–444)
UIBC: 207 ug/dL (ref 120–384)

## 2016-04-18 LAB — FERRITIN: FERRITIN: 55 ng/mL (ref 9–269)

## 2016-04-18 NOTE — Telephone Encounter (Addendum)
Message left on voice mail  ----- Message from [name], NP sent at 04/18/2016  9:46 AM EDT ----- Regarding: Iron  Iron studies stable. No infusion needed at this time. Thank you!  [place]  ----- Message ----- From: Interface, Lab In Three Zero One Sent: 04/15/2016   2:22 PM To: [name], NP

## 2016-04-22 ENCOUNTER — Ambulatory Visit: Payer: Commercial Managed Care - PPO | Admitting: Family

## 2016-04-22 ENCOUNTER — Other Ambulatory Visit: Payer: Commercial Managed Care - PPO

## 2016-04-26 ENCOUNTER — Ambulatory Visit: Payer: Commercial Managed Care - PPO | Admitting: Internal Medicine

## 2016-05-04 ENCOUNTER — Ambulatory Visit: Payer: Commercial Managed Care - PPO | Admitting: Internal Medicine

## 2016-05-04 ENCOUNTER — Other Ambulatory Visit: Payer: Commercial Managed Care - PPO | Admitting: Internal Medicine

## 2016-05-06 ENCOUNTER — Ambulatory Visit (INDEPENDENT_AMBULATORY_CARE_PROVIDER_SITE_OTHER): Payer: Commercial Managed Care - PPO | Admitting: Obstetrics & Gynecology

## 2016-05-06 ENCOUNTER — Telehealth: Payer: Self-pay | Admitting: Obstetrics & Gynecology

## 2016-05-06 ENCOUNTER — Telehealth: Payer: Self-pay | Admitting: Internal Medicine

## 2016-05-06 VITALS — BP 112/86 | HR 80 | Resp 16 | Ht 63.5 in | Wt 136.0 lb

## 2016-05-06 DIAGNOSIS — K602 Anal fissure, unspecified: Secondary | ICD-10-CM | POA: Diagnosis not present

## 2016-05-06 DIAGNOSIS — R21 Rash and other nonspecific skin eruption: Secondary | ICD-10-CM

## 2016-05-06 MED ORDER — HYDROXYZINE HCL 10 MG PO TABS: 10.0000 mg | ORAL_TABLET | Freq: Every evening | ORAL | 0 refills | Status: DC | PRN

## 2016-05-06 NOTE — Telephone Encounter (Signed)
Spoke with patient. Patient scheduled for OV today at 4pm with Dr. Sabra Heck. Patient is agreeable to date and time.  Routing to provider for final review. Patient is agreeable to disposition. Will close encounter.

## 2016-05-06 NOTE — Telephone Encounter (Signed)
Pt called on 04/01/16 stating that the diltiazem gel was not helping fissure. Pt was switched to nitrogylcerin ointment tid at that point.Pt states she is using the nitroglycerin ointment but the fissure is not getting better. Pt calling wanting to know what else she can do. Pt aware that Dr. Hilarie Fredrickson is out of the office, she knows to expect a call back on Monday. Please advise.

## 2016-05-06 NOTE — Telephone Encounter (Signed)
Left message to call Bolden Hagerman at [phone].  

## 2016-05-06 NOTE — Progress Notes (Signed)
GYNECOLOGY  VISIT   HPI: 31 y.o. G46P0000 Married Caucasian female here for check of a firm and tender nodule that is in her groin.  She noted it a week ago.  As it hasn't gotten any better, she felt I should look at this.  Denies abnormal bleeding or drainage.    Also, has a significant anal fissure.  Seeing Dr. Hilarie Fredrickson and she has been prescribed diltiazem gel.  Using this three times daily a lot of perirectal symptoms.  Just doesn't feel good.  As well, having headaches as a result of the treatment.  Did call his office to inform symptoms were not better.  He is out today.  Denies fever.  GYNECOLOGIC HISTORY: Patient's last menstrual period was 04/09/2016 (approximate). Contraception: OCPs  Patient Active Problem List   Diagnosis Date Noted  . History of small bowel obstruction 11/20/2015  . Allergic dermatitis 11/05/2013  . Gastroesophageal reflux disease without esophagitis 09/10/2013  . Other iron deficiency anemia 01/21/2013  . Crohn's disease ([place]) 08/07/2012  . Breast mass in female 04/21/2012  . Cervical cancer screening 01/27/2012  . Preventative health care 11/06/2011    Past Medical History:  Diagnosis Date  . Anemia 11/2011   iron deficiency. 01/2013 parenteral iron. Intranasal B12.  Dr Marin Olp follows.   . B12 deficiency 11/2012  . Breast mass in female 04/21/2012  . Cervical cancer screening 01/27/2012  . Chicken pox as a child  . Crohn's disease ([place]) 2013   ileitis 10/2011. ileitis, mesenteric abscess, ? entero-enteric fistula on 08/2012 CT enterography  . GERD (gastroesophageal reflux disease)   . Hepatitis B immune 07/2012   previous vaccination.   Mindi Slicker 09/2014   per Dermatologist.  Rx Zyrtec, Benadryl.   . Small bowel obstruction   . Vitamin D deficiency 2014    Past Surgical History:  Procedure Laterality Date  . WISDOM TOOTH EXTRACTION  31 yrs old    MEDS:  Reviewed in EPIC and UTD  ALLERGIES: Patient has no known allergies.  Family History    Problem Relation Age of Onset  . Nephrolithiasis Father   . Lung cancer Maternal Grandmother   . Gout Maternal Grandfather   . Ovarian cancer Paternal Grandmother   . Cancer Paternal Grandmother 43    ovarian  . Pancreatic cancer Paternal Grandfather   . Ovarian cancer Paternal Aunt     ovarian  . Other Paternal Uncle 43    cyst removed from pancreas    SH:  Married, non smoker  Review of Systems  Constitutional: Negative.   Gastrointestinal: Negative for abdominal pain, constipation, diarrhea, heartburn, nausea and vomiting.       Rectal and perirectal irritation and pain  Genitourinary: Negative.     PHYSICAL EXAMINATION:    BP 112/86 (BP Location: Right Arm, Patient Position: Sitting, Cuff Size: Normal)   Pulse 80   Resp 16   Ht 5' 3.5" (1.613 m)   Wt 136 lb (61.7 kg)   LMP 04/09/2016 (Approximate)   BMI 23.71 kg/m     Physical Exam  Constitutional: She is oriented to person, place, and time. She appears well-developed and well-nourished.  Cardiovascular: Normal rate and regular rhythm.   Respiratory: Breath sounds normal.  Genitourinary:     Genitourinary Comments: Pt declines a rectal exam today.  Lymphadenopathy:       Left: Inguinal (several mildly enlarged, mobile and tender lymph nodes) adenopathy present.  Neurological: She is alert and oriented to person, place, and time.  Psychiatric:  She has a normal mood and affect.   Chaperone was present for exam.  Assessment: Anal fissure Significant perirectal skin irritation Likely reactive left inguinal LAD  Plan: Feel pt is not applying Diltiazem gel correctly as she is placing all around the rectum.  (this may be contributing to headaches as well).  Correct application with q tip demonstrated for pt.  She voices clear understanding. Clobetasol ointment 0.05% BID for topical irritation Atarax 35m nightly as needed for itching nightly.  Pt aware this may be sedating so to not use during the day unless  she knows what side effects she has.   Recheck 1 week.

## 2016-05-06 NOTE — Telephone Encounter (Signed)
Spoke with patient. Patient reports pea size hard lump in left groin area that is uncomfortable to touch, noticed last Tuesday. Patient denies redness, drainage, warmth. Patient states she feels she should have it checked, is it ok to wait until Monday or Tuesday. Advised patient if uncomfortable would recommend OV for today for further evaluation, as the office is closed Sat and Sun. 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 Advised patient currently can schedule at 12:45 or 4pm with Dr. Sabra Heck. Patient would like to check with her staffing and return call.  Patient verbalizes understanding and is agreeable.

## 2016-05-06 NOTE — Telephone Encounter (Signed)
Patient found a "bump near her panty line". Patient declined an appointment today. Patient would like an appointment Tuesday 05/10/16 if possible. Patient is wondering if she should wait until Tuesday?

## 2016-05-06 NOTE — Telephone Encounter (Signed)
Patient returning your call.

## 2016-05-08 ENCOUNTER — Encounter: Payer: Self-pay | Admitting: Obstetrics & Gynecology

## 2016-05-09 NOTE — Telephone Encounter (Signed)
Left message for pt to call back  °

## 2016-05-09 NOTE — Telephone Encounter (Signed)
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

## 2016-05-10 ENCOUNTER — Encounter: Payer: Self-pay | Admitting: Family

## 2016-05-10 ENCOUNTER — Ambulatory Visit (INDEPENDENT_AMBULATORY_CARE_PROVIDER_SITE_OTHER): Payer: Commercial Managed Care - PPO | Admitting: Family

## 2016-05-10 VITALS — BP 116/70 | HR 68 | Temp 98.5°F | Resp 16 | Ht 64.0 in | Wt 140.2 lb

## 2016-05-10 DIAGNOSIS — L309 Dermatitis, unspecified: Secondary | ICD-10-CM

## 2016-05-10 MED ORDER — FLUOCINOLONE ACETONIDE 0.01 % EX CREA
TOPICAL_CREAM | Freq: Two times a day (BID) | CUTANEOUS | 0 refills | Status: DC
Start: 2016-05-10 — End: 2016-05-30

## 2016-05-10 MED ORDER — CRISABOROLE 2 % EX OINT: 1.0000 "application " | TOPICAL_OINTMENT | Freq: Two times a day (BID) | CUTANEOUS | 0 refills | Status: AC

## 2016-05-10 NOTE — Telephone Encounter (Signed)
Pt states she saw her GYN doc Friday and was told she was not using the ointment properly. States she was putting it on the skin externally not internal. She has started using it correctly now and it seems to be doing better. Pt wants to continue for a while and see if she continues to improve. She will call back in a week to see if she needs to scheduled flex-sig. Dr. Hilarie Fredrickson aware.

## 2016-05-10 NOTE — Patient Instructions (Signed)
Apply a good moisturizer daily to your face such as cetaphil lotion. You may use eucrisa on your eyelids- avoid use of steroid cream in this area. You may also use eucrisa on your face.   Fluocinolone cream may be used in and around your ears.  Let us know if your symptoms worsen or if they fail to improve and we will refer you to dermatology.    Eczema Eczema, also called atopic dermatitis, is a skin disorder that causes inflammation of the skin. It causes a red rash and dry, scaly skin. The skin becomes very itchy. Eczema is generally worse during the cooler winter months and often improves with the warmth of summer. Eczema usually starts showing signs in infancy. Some children outgrow eczema, but it may last through adulthood. What are the causes? The exact cause of eczema is not known, but it appears to run in families. People with eczema often have a family history of eczema, allergies, asthma, or hay fever. Eczema is not contagious. Flare-ups of the condition may be caused by:  Contact with something you are sensitive or allergic to.  Stress. What are the signs or symptoms?  Dry, scaly skin.  Red, itchy rash.  Itchiness. This may occur before the skin rash and may be very intense. How is this diagnosed? The diagnosis of eczema is usually made based on symptoms and medical history. How is this treated? Eczema cannot be cured, but symptoms usually can be controlled with treatment and other strategies. A treatment plan might include:  Controlling the itching and scratching.  Use over-the-counter antihistamines as directed for itching. This is especially useful at night when the itching tends to be worse.  Use over-the-counter steroid creams as directed for itching.  Avoid scratching. Scratching makes the rash and itching worse. It may also result in a skin infection (impetigo) due to a break in the skin caused by scratching.  Keeping the skin well moisturized with creams every  day. This will seal in moisture and help prevent dryness. Lotions that contain alcohol and water should be avoided because they can dry the skin.  Limiting exposure to things that you are sensitive or allergic to (allergens).  Recognizing situations that cause stress.  Developing a plan to manage stress. Follow these instructions at home:  Only take over-the-counter or prescription medicines as directed by your health care provider.  Do not use anything on the skin without checking with your health care provider.  Keep baths or showers short (5 minutes) in warm (not hot) water. Use mild cleansers for bathing. These should be unscented. You may add nonperfumed bath oil to the bath water. It is best to avoid soap and bubble bath.  Immediately after a bath or shower, when the skin is still damp, apply a moisturizing ointment to the entire body. This ointment should be a petroleum ointment. This will seal in moisture and help prevent dryness. The thicker the ointment, the better. These should be unscented.  Keep fingernails cut short. Children with eczema may need to wear soft gloves or mittens at night after applying an ointment.  Dress in clothes made of cotton or cotton blends. Dress lightly, because heat increases itching.  A child with eczema should stay away from anyone with fever blisters or cold sores. The virus that causes fever blisters (herpes simplex) can cause a serious skin infection in children with eczema. Contact a health care provider if:  Your itching interferes with sleep.  Your rash gets worse or  is not better within 1 week after starting treatment.  You see pus or soft yellow scabs in the rash area.  You have a fever.  You have a rash flare-up after contact with someone who has fever blisters. This information is not intended to replace advice given to you by your health care provider. Make sure you discuss any questions you have with your health care  provider. Document Released: 01/08/2000 Document Revised: 06/18/2015 Document Reviewed: 08/13/2012 Elsevier Interactive Patient Education  2017 [employer].

## 2016-05-10 NOTE — Progress Notes (Signed)
Subjective:    Patient ID: Carla Clark, female    DOB: [birth year], 31 y.o.   MRN: [id number]  HPI  Carla Clark is a 31 yr old female who presents today with chief complaint of rash. Rash is located around her lips, eyelids and ears. Rash has been present for about 3 months.    Review of Systems See HPI  Past Medical History:  Diagnosis Date  . Anemia 11/2011   iron deficiency. 01/2013 parenteral iron. Intranasal B12.  Dr Marin Olp follows.   . B12 deficiency 11/2012  . Breast mass in female 04/21/2012  . Cervical cancer screening 01/27/2012  . Chicken pox as a child  . Crohn's disease ([place]) 2013   ileitis 10/2011. ileitis, mesenteric abscess, ? entero-enteric fistula on 08/2012 CT enterography  . GERD (gastroesophageal reflux disease)   . Hepatitis B immune 07/2012   previous vaccination.   Carla Clark 09/2014   per Dermatologist.  Rx Zyrtec, Benadryl.   . Small bowel obstruction (Carla Clark)   . Vitamin D deficiency 2014     Social History   Social History  . Marital status: Married    Spouse name: N/A  . Number of children: N/A  . Years of education: N/A   Occupational History  . 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 Smoking status: Never Smoker  . Smokeless tobacco: Never Used     Comment: never used tobacco  . Alcohol use No     Comment: occasionaly  . Drug use: No  . Sexual activity: Yes    Partners: Male    Birth control/ protection: Pill     Comment: lives with husband, works at [employer] at [employer]  . Not on file   Social History Narrative  . No narrative on file    Past Surgical History:  Procedure Laterality Date  . WISDOM TOOTH EXTRACTION  31 yrs old    Family History  Problem Relation Age of Onset  . Nephrolithiasis Father   . Lung cancer Maternal Grandmother   . Gout Maternal Grandfather   . Ovarian cancer Paternal Grandmother   . Cancer Paternal Grandmother 25    ovarian  . Pancreatic cancer Paternal  Grandfather   . Ovarian cancer Paternal Aunt     ovarian  . Other Paternal Uncle 20    cyst removed from pancreas    No Known Allergies  Current Outpatient Prescriptions on File Prior to Visit  Medication Sig Dispense Refill  . Adalimumab 40 MG/0.8ML PNKT every 14 (fourteen) days.     . AMBULATORY NON FORMULARY MEDICATION Nitroglycerin ointment .02% Apply pea sized amount to the anal canal TID 30 g 3  . clobetasol ointment (TEMOVATE) 3.61 % Apply 1 application topically 2 (two) times daily. Apply as directed twice daily 60 g 1  . cyanocobalamin (,VITAMIN B-12,) 1000 MCG/ML injection Inject 1000 mcg (1 mL) into the skin every 2 weeks for 2 doses, then inject 1000 mcg (1 mL) into the skin monthly. 10 mL 0  . famotidine (PEPCID) 20 MG tablet Take 1 tablet (20 mg total) by mouth 2 (two) times daily. 60 tablet 3  . hydrOXYzine (ATARAX/VISTARIL) 10 MG tablet Take 1 tablet (10 mg total) by mouth at bedtime as needed. 30 tablet 0  . hyoscyamine (LEVSIN SL) 0.125 MG SL tablet Take 1-2 tablets by mouth every 6 hours as needed for spasm/pain 60 tablet 3  . Probiotic Product (MISC INTESTINAL FLORA  REGULAT) CHEW Chew by mouth every other day. Digestive Health probiotic gummies by [employer]    . SYRINGE-NEEDLE, DISP, 3 ML 25G X 5/8" 3 ML MISC Use as directed for B12 injections. 50 each 0  . VIORELE 0.15-0.02/0.01 MG (21/5) tablet TAKE 1 TABLET BY MOUTH EVERY DAY (NEED APPT) 84 tablet 0   No current facility-administered medications on file prior to visit.     BP 116/70 (BP Location: Right Arm, Cuff Size: Normal)   Pulse 68   Temp 98.5 F (36.9 C) (Oral)   Resp 16   Ht 5' 4"  (1.626 m)   Wt 140 lb 3.2 oz (63.6 kg)   LMP 05/07/2016   SpO2 100% Comment: room air  BMI 24.07 kg/m       Objective:   Physical Exam  Constitutional: She is oriented to person, place, and time. She appears well-developed and well-nourished. No distress.  Neurological: She is alert and oriented to person, place, and  time.  Skin: Skin is warm and dry.  Dry scaling rash noted around lips, eyelids, some scaling noted in/near ear canals  Psychiatric: She has a normal mood and affect. Her behavior is normal. Judgment and thought content normal.          Assessment & Plan:  Eczema- will rx a low potency steroid for ears. Eucrisa for eyelids. Can also be used on face. Pt is advised as follows:  Apply a good moisturizer daily to your face such as cetaphil lotion. You may use eucrisa on your eyelids- avoid use of steroid cream in this area. You may also use eucrisa on your face.   Fluocinolone cream may be used in and around your ears.  Let us know if your symptoms worsen or if they fail to improve and we will refer you to dermatology.

## 2016-05-10 NOTE — Progress Notes (Signed)
Pre visit review using our clinic review tool, if applicable. No additional management support is needed unless otherwise documented below in the visit note. 

## 2016-05-12 ENCOUNTER — Ambulatory Visit (INDEPENDENT_AMBULATORY_CARE_PROVIDER_SITE_OTHER): Payer: Commercial Managed Care - PPO | Admitting: Obstetrics & Gynecology

## 2016-05-12 VITALS — BP 118/70 | HR 88 | Resp 16 | Ht 64.0 in | Wt 137.0 lb

## 2016-05-12 DIAGNOSIS — R21 Rash and other nonspecific skin eruption: Secondary | ICD-10-CM

## 2016-05-13 ENCOUNTER — Encounter: Payer: Self-pay | Admitting: Obstetrics & Gynecology

## 2016-05-13 NOTE — Progress Notes (Signed)
GYNECOLOGY  VISIT   HPI: 31 y.o. G48P0000 Married Caucasian female here for follow up after being seen last week for significant rectal irritation that seems to be coming from pt applying the diltiazem gel inappropriately.  She was advised how to apply this with a q tip inside the rectum and reports she is not having any issues with this.  She was also started on topical clobetasol ointment which has helped significantly.  She has used the Atarax at night but didn't last night as symptoms are much, much improved.  Reports she is basically not having any issues with itching now.    Denies rectal bleeding.  Rectal pain is much better.    Pt did call GI and advised she was much better.  Has follow up in several weeks.    D/w pt current skin issues.  She having peri-oral itching and skin irritation.  Has been diagnosed with eczema.  Has new ointment for this that she hasn't started.  D/w pt possibility of relation to medication she uses for crohn's disease but this controls her symptoms so well, I do not think she should consider switching.  Will just need to manage skin issues best we can.  GYNECOLOGIC HISTORY: Patient's last menstrual period was 05/07/2016. Contraception: OCPs  Patient Active Problem List   Diagnosis Date Noted  . History of small bowel obstruction 11/20/2015  . Allergic dermatitis 11/05/2013  . Gastroesophageal reflux disease without esophagitis 09/10/2013  . Other iron deficiency anemia 01/21/2013  . Crohn's disease ([place]) 08/07/2012  . Breast mass in female 04/21/2012  . Cervical cancer screening 01/27/2012  . Preventative health care 11/06/2011    Past Medical History:  Diagnosis Date  . Anemia 11/2011   iron deficiency. 01/2013 parenteral iron. Intranasal B12.  Dr Marin Olp follows.   . B12 deficiency 11/2012  . Breast mass in female 04/21/2012  . Cervical cancer screening 01/27/2012  . Chicken pox as a child  . Crohn's disease ([place]) 2013   ileitis 10/2011. ileitis,  mesenteric abscess, ? entero-enteric fistula on 08/2012 CT enterography  . GERD (gastroesophageal reflux disease)   . Hepatitis B immune 07/2012   previous vaccination.   Mindi Slicker 09/2014   per Dermatologist.  Rx Zyrtec, Benadryl.   . Small bowel obstruction ([place])   . Vitamin D deficiency 2014    Past Surgical History:  Procedure Laterality Date  . WISDOM TOOTH EXTRACTION  31 yrs old    MEDS:  Reviewed in EPIC and UTD  ALLERGIES: Patient has no known allergies.  Family History  Problem Relation Age of Onset  . Nephrolithiasis Father   . Lung cancer Maternal Grandmother   . Gout Maternal Grandfather   . Ovarian cancer Paternal Grandmother   . Cancer Paternal Grandmother 83    ovarian  . Pancreatic cancer Paternal Grandfather   . Ovarian cancer Paternal Aunt     ovarian  . Other Paternal Uncle 42    cyst removed from pancreas    SH:  Married, non smoker  Review of Systems  Skin: Positive for itching (perioral).  All other systems reviewed and are negative.   PHYSICAL EXAMINATION:    BP 118/70 (BP Location: Right Arm, Patient Position: Sitting, Cuff Size: Normal)   Pulse 88   Resp 16   Ht 5' 4"  (1.626 m)   Wt 137 lb (62.1 kg)   LMP 05/07/2016   BMI 23.52 kg/m     General appearance: alert, cooperative and appears stated age  Pelvic:  External genitalia:  no lesions              Urethra:  normal appearing urethra with no masses, tenderness or lesions               Anus:  Skin appears completely normal today, no lesions  Chaperone was present for exam.  Assessment: Perirectal itching, significantly improved Crohn's disease Anal fissure  Plan: Pt will taper off steroid ointment.  Instructions provided.  RF provided for pt to have on hand if has new symptoms.  Advised to not wait to come in with new onset of symptoms.   ~15 minutes spent with patient >50% of time was in face to face discussion of above.

## 2016-05-17 ENCOUNTER — Telehealth: Payer: Self-pay | Admitting: Internal Medicine

## 2016-05-17 NOTE — Telephone Encounter (Signed)
Left message for pt to call back  °

## 2016-05-18 NOTE — Telephone Encounter (Signed)
Pt states she was using the medications for her fissure incorrectly and once she changed how she was using the meds she is improving.

## 2016-05-30 ENCOUNTER — Encounter: Payer: Self-pay | Admitting: Internal Medicine

## 2016-05-30 ENCOUNTER — Ambulatory Visit (INDEPENDENT_AMBULATORY_CARE_PROVIDER_SITE_OTHER): Payer: Commercial Managed Care - PPO | Admitting: Internal Medicine

## 2016-05-30 VITALS — BP 104/66 | HR 82 | Ht 63.5 in | Wt 136.0 lb

## 2016-05-30 DIAGNOSIS — K5000 Crohn's disease of small intestine without complications: Secondary | ICD-10-CM

## 2016-05-30 DIAGNOSIS — R1031 Right lower quadrant pain: Secondary | ICD-10-CM | POA: Diagnosis not present

## 2016-05-30 DIAGNOSIS — K602 Anal fissure, unspecified: Secondary | ICD-10-CM | POA: Diagnosis not present

## 2016-05-30 MED ORDER — AMBULATORY NON FORMULARY MEDICATION: 0 refills | Status: DC

## 2016-05-30 MED ORDER — CYANOCOBALAMIN 500 MCG/0.1ML NA SOLN: NASAL | 2 refills | Status: DC

## 2016-05-30 MED ORDER — NA SULFATE-K SULFATE-MG SULF 17.5-3.13-1.6 GM/177ML PO SOLN: ORAL | 0 refills | Status: DC

## 2016-05-30 NOTE — Patient Instructions (Signed)
You have been scheduled for a colonoscopy. Please follow written instructions given to you at your visit today.  Please pick up your prep supplies at the pharmacy within the next 1-3 days. If you use inhalers (even only as needed), please bring them with you on the day of your procedure. Your physician has requested that you go to www.startemmi.com and enter the access code given to you at your visit today. This web site gives a general overview about your procedure. However, you should still follow specific instructions given to you by our office regarding your preparation for the procedure.  We have sent a prescription for nitroglycerin 0.2% gel to [facility]. You should apply a pea size amount to your rectum three times daily x 3-4 weeks.  [facility]'s information is below: Address: [address] [place], [place] [address]  [phone]  Please continue your Humira.  If you are age 61 or older, your body mass index should be between 23-30. Your Body mass index is 23.71 kg/m. If this is out of the aforementioned range listed, please consider follow up with your Primary Care Provider.  If you are age 66 or younger, your body mass index should be between 19-25. Your Body mass index is 23.71 kg/m. If this is out of the aformentioned range listed, please consider follow up with your Primary Care Provider.

## 2016-05-30 NOTE — Progress Notes (Signed)
Subjective:    Patient ID: Carla Clark, female    DOB: 1985/02/19, 31 y.o.   MRN: [id number]  HPI Carla Clark is a 31 year old female with a history of ileal Crohn's disease, history of SBO related to Crohn's disease in 2017, history of IDA, history of B12 deficiency and recent anal fissure who is here for follow-up. She was last seen in the office in February 2018. She has been maintained on Humira 40 mg every 14 days.  She was treated with diltiazem gel 2% twice a day but called back to state this wasn't working for anal fissure symptoms. She was switched to nitroglycerin ointment 3 times a day which she used for about a month also without much benefit. She was seen by her gynecologist during this time and it was discovered that she was using medication incorrectly. She was applying externally and not to the anal canal. Her gynecologist informed her again how to use this medication and she has been applying it 3 times a day to the anal canal with a Q-tip. Symptoms have improved 50%. She still seeing some red blood on the tissue with wiping. This is not with every bowel movement. There is some discomfort with passing bowel movements again not daily but several times per week. This is the part that has improved with correct application of nitroglycerin. Perianal itching resolved entirely when she began using the medication to the anal now rather than external skin.  She reports in retrospect she was not using the diltiazem gel correctly either  Bowel movements have been 1-2 times per day. She switched coffee brands and noticed some loose stool but she has gone back to her regular coffee. She denies melena. She has some right lower quadrant pain if she stands quickly. Other times this does not bother her. She denies upper GI complaint including no nausea, vomiting, early satiety or dysphagia. No hepatobiliary complaint.  She admits to not using B12 consistently. She is using Humira consistently 40 mg  every 14 days  Review of Systems As per HPI, otherwise negative  Current Medications, Allergies, Past Medical History, Past Surgical History, Family History and Social History were reviewed in [employer] record.     Objective:   Physical Exam BP 104/66   Pulse 82   Ht 5' 3.5" (1.613 m)   Wt 136 lb (61.7 kg)   LMP 05/07/2016   BMI 23.71 kg/m  Constitutional: Well-developed and well-nourished. No distress. HEENT: Normocephalic and atraumatic. Oropharynx is clear and moist. Conjunctivae are normal.  No scleral icterus. Neck: Neck supple. Trachea midline. Cardiovascular: Normal rate, regular rhythm and intact distal pulses. No M/R/G Pulmonary/chest: Effort normal and breath sounds normal. No wheezing, rales or rhonchi. Abdominal: Soft, moderate RLQ tenderness without rebound or guarding, nondistended. Bowel sounds active throughout. There are no masses palpable. No hepatosplenomegaly. Extremities: no clubbing, cyanosis, or edema Neurological: Alert and oriented to person place and time. Skin: Skin is warm and dry. Psychiatric: Normal mood and affect. Behavior is normal.  CBC    Component Value Date/Time   WBC 7.2 04/15/2016 1407   WBC 9.0 07/22/2015 1447   RBC 4.41 04/15/2016 1407   RBC 4.44 07/22/2015 1447   HGB 12.8 04/15/2016 1407   HCT 39.7 04/15/2016 1407   PLT 306 04/15/2016 1407   MCV 90 04/15/2016 1407   MCH 29.0 04/15/2016 1407   MCH 28.8 04/18/2015 0433   MCHC 32.2 04/15/2016 1407   MCHC 33.7 07/22/2015 1447  RDW 12.9 04/15/2016 1407   LYMPHSABS 2.9 04/15/2016 1407   MONOABS 0.7 07/22/2015 1447   EOSABS 0.2 04/15/2016 1407   BASOSABS 0.0 04/15/2016 1407   CMP     Component Value Date/Time   NA 138 03/11/2016 1145   K 4.1 03/11/2016 1145   CL 106 03/11/2016 1145   CO2 26 03/11/2016 1145   GLUCOSE 103 (H) 03/11/2016 1145   BUN 7 03/11/2016 1145   CREATININE 0.72 03/11/2016 1145   CREATININE 0.62 08/20/2012 1139   CALCIUM 9.4  03/11/2016 1145   PROT 7.1 03/11/2016 1145   ALBUMIN 4.2 03/11/2016 1145   AST 14 03/11/2016 1145   ALT 14 03/11/2016 1145   ALKPHOS 43 03/11/2016 1145   BILITOT 0.5 03/11/2016 1145   GFRNONAA >60 04/18/2015 0433   GFRAA >60 04/18/2015 0433   Lab Results  Component Value Date   VITAMINB12 217 03/11/2016       Assessment & Plan:  31 year old female with a history of ileal Crohn's disease, history of SBO related to Crohn's disease in 2017, history of IDA, history of B12 deficiency and recent anal fissure who is here for follow-up.  1. Anal fissure -- 50% improved with 3 weeks of correct nitroglycerin application. I expect this will heal completely if she continues to use the medication correctly. She is scheduled for flexible sigmoidoscopy on 06/14/2016 to rule out other pathology such as perianal Crohn's disease given her history. See #2. Continue nitroglycerin ointment 3 times a day to the anal canal for an additional 3-4 weeks. Notify me if not 100% resolution of symptoms  2. Ileal Crohn's disease -- some right lower quadrant abdominal pain. Of note her brother was recently diagnosed with Fistulizing Crohn's disease at age 29. She has not had an assessment of disease activity since diagnosis in 2013. Given question of perianal Crohn's disease and her right lower quadrant pain I recommended that we change the flexible sigmoidoscopy to colonoscopy. We discussed the risks, benefits and alternatives and she wishes to proceed. This will help in making decisions regarding management of Crohn's. She will continue Humira 40 mg every 14 days for now.  3. B12 deficiency -- inconsistent use of IM B12. B12 low normal in February. Will try the intranasal B12 one spray once weekly  25 minutes spent with the patient today. Greater than 50% was spent in counseling and coordination of care with the patient

## 2016-06-14 ENCOUNTER — Encounter: Payer: Commercial Managed Care - PPO | Admitting: Internal Medicine

## 2016-06-15 ENCOUNTER — Encounter: Payer: Self-pay | Admitting: Internal Medicine

## 2016-06-24 ENCOUNTER — Other Ambulatory Visit: Payer: Commercial Managed Care - PPO

## 2016-06-24 ENCOUNTER — Encounter: Payer: Self-pay | Admitting: Internal Medicine

## 2016-06-24 ENCOUNTER — Ambulatory Visit (AMBULATORY_SURGERY_CENTER): Payer: Commercial Managed Care - PPO | Admitting: Internal Medicine

## 2016-06-24 VITALS — BP 107/59 | HR 73 | Temp 98.7°F | Resp 14 | Ht 63.5 in | Wt 136.0 lb

## 2016-06-24 DIAGNOSIS — D123 Benign neoplasm of transverse colon: Secondary | ICD-10-CM | POA: Diagnosis not present

## 2016-06-24 DIAGNOSIS — K5000 Crohn's disease of small intestine without complications: Secondary | ICD-10-CM

## 2016-06-24 DIAGNOSIS — K5190 Ulcerative colitis, unspecified, without complications: Secondary | ICD-10-CM | POA: Diagnosis not present

## 2016-06-24 MED ORDER — SODIUM CHLORIDE 0.9 % IV SOLN: 500.0000 mL | INTRAVENOUS | Status: DC

## 2016-06-24 NOTE — Op Note (Signed)
[facility] Patient Name: Carla Clark Procedure Date: 06/24/2016 9:00 AM MRN: [id number] Endoscopist: Jerene Bears , MD Age: 31 Referring MD:  Date of Birth: 1985/07/27 Gender: Female Account #: [id number] Procedure:                Colonoscopy Indications:              Crohn's disease of the small bowel, Disease                            activity assessment of Crohn's disease of the small                            bowel, recent anal fissure for exclusion of                            perianal involvement Medicines:                Monitored Anesthesia Care Procedure:                Pre-Anesthesia Assessment:                           - Prior to the procedure, a History and Physical                            was performed, and patient medications and                            allergies were reviewed. The patient's tolerance of                            previous anesthesia was also reviewed. The risks                            and benefits of the procedure and the sedation                            options and risks were discussed with the patient.                            All questions were answered, and informed consent                            was obtained. Prior Anticoagulants: The patient has                            taken no previous anticoagulant or antiplatelet                            agents. ASA Grade Assessment: II - A patient with                            mild systemic disease. After reviewing the risks  and benefits, the patient was deemed in                            satisfactory condition to undergo the procedure.                           After obtaining informed consent, the colonoscope                            was passed under direct vision. Throughout the                            procedure, the patient's blood pressure, pulse, and                            oxygen saturations were monitored continuously. The                          Model PCF-[id number]) scope was introduced                            through the anus and advanced to the the cecum,                            identified by appendiceal orifice and ileocecal                            valve. The colonoscopy was performed without                            difficulty. The patient tolerated the procedure                            well. The quality of the bowel preparation was                            good. The ileocecal valve, appendiceal orifice, and                            rectum were photographed. Scope In: 9:21:10 AM Scope Out: 9:40:55 AM Scope Withdrawal Time: 0 hours 14 minutes 25 seconds  Total Procedure Duration: 0 hours 19 minutes 45 seconds  Findings:                 The perianal exam findings include a skin tag was                            present posteriorly (at site of previously seen                            anal fissure). No evidence of fissure today.                           The ileocecal valve contained a benign-appearing,  intrinsic moderate stenosis that was unable to be                            traversed. There was polypoid tissue likely related                            to Crohn's inflammation at the IC valve.                           Localized moderate inflammation characterized by                            erythema and polypoid mucosa/inflammation was found                            at the ileocecal valve. Biopsies were taken with a                            cold forceps for histology.                           A 6 mm polyp was found in the hepatic flexure. The                            polyp was sessile. The polyp was removed with a                            cold snare. Resection and retrieval were complete.                           The exam was otherwise without abnormality.                           The retroflexed view of the distal rectum and anal                             verge was normal and showed no anal or rectal                            abnormalities. Complications:            No immediate complications. Estimated Blood Loss:     Estimated blood loss was minimal. Impression:               - Perianal skin tag found on perianal exam. No                            evidence of active anal fissure.                           - Stricture at the ileocecal valve.                           - Localized moderate inflammation with inflammatory  polypoid mucosa was found at the ileocecal                            valve/terminal ileum secondary to Crohn's disease                            with ileitis. Biopsied.                           - One 6 mm polyp at the hepatic flexure, removed                            with a cold snare. Resected and retrieved.                           - The examination was otherwise normal.                           - The distal rectum and anal verge are normal on                            retroflexion view. Recommendation:           - Patient has a contact number available for                            emergencies. The signs and symptoms of potential                            delayed complications were discussed with the                            patient. Return to normal activities tomorrow.                            Written discharge instructions were provided to the                            patient.                           - Resume previous diet.                           - Continue present medications.                           - Check Humira level/Ab today.                           - Await pathology results.                           - Repeat colonoscopy is recommended. The                            colonoscopy date  will be determined after pathology                            results from today's exam become available for                            review. Jerene Bears, MD 06/24/2016 9:58:35 AM This report has been signed electronically.

## 2016-06-24 NOTE — Progress Notes (Signed)
To recovery, report to Ronnald Ramp, Therapist, sports, [facility]

## 2016-06-24 NOTE — Progress Notes (Signed)
Pt's states no medical or surgical changes since previsit or office visit.Pt's states no medical or surgical changes since previsit or office visit.

## 2016-06-24 NOTE — Progress Notes (Signed)
Called to room to assist during endoscopic procedure.  Patient ID and intended procedure confirmed with present staff. Received instructions for my participation in the procedure from the performing physician.  

## 2016-06-24 NOTE — Patient Instructions (Signed)
YOU HAD AN ENDOSCOPIC PROCEDURE TODAY AT [facility]:   Refer to the procedure report that was given to you for any specific questions about what was found during the examination.  If the procedure report does not answer your questions, please call your gastroenterologist to clarify.  If you requested that your care partner not be given the details of your procedure findings, then the procedure report has been included in a sealed envelope for you to review at your convenience later.  YOU SHOULD EXPECT: Some feelings of bloating in the abdomen. Passage of more gas than usual.  Walking can help get rid of the air that was put into your GI tract during the procedure and reduce the bloating. If you had a lower endoscopy (such as a colonoscopy or flexible sigmoidoscopy) you may notice spotting of blood in your stool or on the toilet paper. If you underwent a bowel prep for your procedure, you may not have a normal bowel movement for a few days.  Please Note:  You might notice some irritation and congestion in your nose or some drainage.  This is from the oxygen used during your procedure.  There is no need for concern and it should clear up in a day or so.  SYMPTOMS TO REPORT IMMEDIATELY:   Following lower endoscopy (colonoscopy or flexible sigmoidoscopy):  Excessive amounts of blood in the stool  Significant tenderness or worsening of abdominal pains  Swelling of the abdomen that is new, acute  Fever of 100F or higher   For urgent or emergent issues, a gastroenterologist can be reached at any hour by calling [phone].   DIET:  We do recommend a small meal at first, but then you may proceed to your regular diet.  Drink plenty of fluids but you should avoid alcoholic beverages for 24 hours.  ACTIVITY:  You should plan to take it easy for the rest of today and you should NOT DRIVE or use heavy machinery until tomorrow (because of the sedation medicines used during the test).     FOLLOW UP: Our staff will call the number listed on your records the next business day following your procedure to check on you and address any questions or concerns that you may have regarding the information given to you following your procedure. If we do not reach you, we will leave a message.  However, if you are feeling well and you are not experiencing any problems, there is no need to return our call.  We will assume that you have returned to your regular daily activities without incident.  If any biopsies were taken you will be contacted by phone or by letter within the next 1-3 weeks.  Please call us at [phone] if you have not heard about the biopsies in 3 weeks.   Await for biopsy results Polyps (handout given) Appointment on August 18, 2016 at 1030   SIGNATURES/CONFIDENTIALITY: You and/or your care partner have signed paperwork which will be entered into your electronic medical record.  These signatures attest to the fact that that the information above on your After Visit Summary has been reviewed and is understood.  Full responsibility of the confidentiality of this discharge information lies with you and/or your care-partner.

## 2016-06-27 ENCOUNTER — Telehealth: Payer: Self-pay | Admitting: *Deleted

## 2016-06-27 NOTE — Telephone Encounter (Signed)
  Follow up Call-  Call back number 06/24/2016  Post procedure Call Back phone  # [phone]  Permission to leave phone message Yes  Some recent data might be hidden     Patient questions:  Do you have a fever, pain , or abdominal swelling? No. Pain Score  0 *  Have you tolerated food without any problems? Yes.    Have you been able to return to your normal activities? Yes.    Do you have any questions about your discharge instructions: Diet   No. Medications  No. Follow up visit  No.  Do you have questions or concerns about your Care? No.  Actions: * If pain score is 4 or above: No action needed, pain <4.

## 2016-06-28 ENCOUNTER — Encounter: Payer: Self-pay | Admitting: Internal Medicine

## 2016-06-29 LAB — ADALIMUMAB+AB (SERIAL MONITOR): Adalimumab Drug Level: 11 ug/mL

## 2016-06-29 LAB — SERIAL MONITORING

## 2016-07-22 ENCOUNTER — Encounter: Payer: Commercial Managed Care - PPO | Admitting: Family Medicine

## 2016-08-18 ENCOUNTER — Encounter: Payer: Self-pay | Admitting: Internal Medicine

## 2016-08-18 ENCOUNTER — Ambulatory Visit (INDEPENDENT_AMBULATORY_CARE_PROVIDER_SITE_OTHER): Payer: Commercial Managed Care - PPO | Admitting: Internal Medicine

## 2016-08-18 ENCOUNTER — Encounter: Payer: Commercial Managed Care - PPO | Admitting: Family Medicine

## 2016-08-18 VITALS — BP 100/68 | Ht 63.5 in | Wt 137.0 lb

## 2016-08-18 DIAGNOSIS — K56699 Other intestinal obstruction unspecified as to partial versus complete obstruction: Secondary | ICD-10-CM

## 2016-08-18 DIAGNOSIS — E538 Deficiency of other specified B group vitamins: Secondary | ICD-10-CM

## 2016-08-18 DIAGNOSIS — K50018 Crohn's disease of small intestine with other complication: Secondary | ICD-10-CM | POA: Diagnosis not present

## 2016-08-18 DIAGNOSIS — Z86010 Personal history of colonic polyps: Secondary | ICD-10-CM | POA: Diagnosis not present

## 2016-08-18 DIAGNOSIS — K219 Gastro-esophageal reflux disease without esophagitis: Secondary | ICD-10-CM | POA: Diagnosis not present

## 2016-08-18 NOTE — Progress Notes (Signed)
Subjective:    Patient ID: Carla Clark, female    DOB: 1985-12-03, 31 y.o.   MRN: [id number]  HPI Shadell Brenn is a 31 year old female with a history of ileal Crohn's disease, history of SBO related to Crohn's disease in 2017, history of iron deficiency anemia felt secondary to Crohn's disease, history of B12 deficiency and history of anal fissure who is here for follow-up. She was last seen in the office on 05/30/2016 and for colonoscopy to evaluate disease activity on 06/24/2016. She is here alone today.  Her colonoscopy revealed moderate inflammation and polypoid mucosa at the ileocecal valve. This was biopsied. There was a benign appearing stenosis at the IC valve and the terminal ileum was unable to be intubated despite the use of a pediatric colonoscope. A 6 mm polyp was removed from the hepatic flexure which was found to be a sessile serrated polyp without dysplasia. Exam was otherwise unremarkable. The perianal exam revealed a skin tag posteriorly at the site of previously seen anal fissure. At that time the anal fissure was felt to have healed.  She has been maintained on Humira 40 mg every 14 days which she has continued. Recheck Humira level and antibody just after her procedure and antibodies were negative. Her Humira level was around 11.  She has been using Pepcid 20 mg twice a day intermittently for heartburn.  She reports on the whole she has been feeling well. She had 2 days of epigastric abdominal pain earlier this month. She was attending a camp at [facility] and reports she feels like this pain was related to eating and their cafeteria. It was associated with "extreme bloating" and epigastric discomfort. No nausea or vomiting. Symptoms resolved entirely.  She has had less lower and right-sided abdominal pain recently. Bowel movements a been regular without blood or melena. She reports complete resolution aphasia her symptoms. She does occasionally have perianal irritation which she  states responds well to over-the-counter Desitin cream.  She is actively considering pregnancy though she and her husband are not actively trying to become pregnant at this time. She has discussed this with Dr. Sabra Heck her OB/GYN physician.  She has been more consistent with her B12 since we switched the nasal variety for supplementation.   Review of Systems As per history of present illness, otherwise negative  Current Medications, Allergies, Past Medical History, Past Surgical History, Family History and Social History were reviewed in [employer] record.     Objective:   Physical Exam BP 100/68   Ht 5' 3.5" (1.613 m)   Wt 137 lb (62.1 kg)   BMI 23.89 kg/m  Constitutional: Well-developed and well-nourished. No distress. HEENT: Normocephalic and atraumatic. Oropharynx is clear and moist. Conjunctivae are normal.  No scleral icterus. Neck: Neck supple. Trachea midline. Cardiovascular: Normal rate, regular rhythm and intact distal pulses. No M/R/G Pulmonary/chest: Effort normal and breath sounds normal. No wheezing, rales or rhonchi. Abdominal: Soft, nontender, nondistended. Bowel sounds active throughout. There are no masses palpable. No hepatosplenomegaly. Extremities: no clubbing, cyanosis, or edema Neurological: Alert and oriented to person place and time. Skin: Skin is warm and dry. Psychiatric: Normal mood and affect. Behavior is normal.  CBC    Component Value Date/Time   WBC 7.2 04/15/2016 1407   WBC 9.0 07/22/2015 1447   RBC 4.41 04/15/2016 1407   RBC 4.44 07/22/2015 1447   HGB 12.8 04/15/2016 1407   HCT 39.7 04/15/2016 1407   PLT 306 04/15/2016 1407  MCV 90 04/15/2016 1407   MCH 29.0 04/15/2016 1407   MCH 28.8 04/18/2015 0433   MCHC 32.2 04/15/2016 1407   MCHC 33.7 07/22/2015 1447   RDW 12.9 04/15/2016 1407   LYMPHSABS 2.9 04/15/2016 1407   MONOABS 0.7 07/22/2015 1447   EOSABS 0.2 04/15/2016 1407   BASOSABS 0.0 04/15/2016 1407    CMP     Component Value Date/Time   NA 138 03/11/2016 1145   K 4.1 03/11/2016 1145   CL 106 03/11/2016 1145   CO2 26 03/11/2016 1145   GLUCOSE 103 (H) 03/11/2016 1145   BUN 7 03/11/2016 1145   CREATININE 0.72 03/11/2016 1145   CREATININE 0.62 08/20/2012 1139   CALCIUM 9.4 03/11/2016 1145   PROT 7.1 03/11/2016 1145   ALBUMIN 4.2 03/11/2016 1145   AST 14 03/11/2016 1145   ALT 14 03/11/2016 1145   ALKPHOS 43 03/11/2016 1145   BILITOT 0.5 03/11/2016 1145   GFRNONAA >60 04/18/2015 0433   GFRAA >60 04/18/2015 0433       Assessment & Plan:  31 year old female with a history of ileal Crohn's disease, history of SBO related to Crohn's disease in 2017, history of iron deficiency anemia felt secondary to Crohn's disease, history of B12 deficiency and history of anal fissure who is here for follow-up.   1. Ileal Crohn's disease with stricture at ICV -- clinically she is doing well and certainly seems to be responding to ongoing Humira therapy. The 2 days of epigastric abdominal pain and bloating from earlier this month could have been mild partial obstructive symptoms. I asked that she monitor closely for recurrence of these episodes and notify me if this is occurring. We also discussed that eventually she may require ileocecectomy for Crohn's disease with stricture. I do not think this is necessary at present. We spent a great deal of time today discussing IBD management, Humira therapy and pregnancy. Given her known active Crohn's disease, history of bowel obstruction she is high risk for complications due to Crohn's disease with discontinuation of anti-TNF therapy. We discussed that [place] has been safely use during pregnancy but it does cross the placental barrier. It is not known to cause birth defects. We discussed how Cimzia does not cross the placenta and generally is undetectable in newborn infants. Cimzia can cross into breast milk. I encouraged her to contact her Humira ambassador  for pregnancy literature regarding Humira and also she will visit the [facility] website to review their pregnancy data. We have reviewed this together today. She will also discuss this with her obstetrician, Dr. Sabra Heck. I am comfortable treating through pregnancy with Humira but if she would prefer switching to Cimzia I am also okay with that. I explained that she may lose the ability to go back to Humira if we stop this due to potential antibody formation.  2. Intermittent heartburn -- Pepcid 20 mg twice a day when necessary. No alarm symptoms  3. B12 deficiency -- continue nasal B12 one spray once weekly  4. History of colon polyp -- we discussed her sessile serrated polyp in my recommendation to repeat colonoscopy for surveillance in 3 years.  5. Anal fissure -- healed with nitroglycerin ointment. For a while she was not applying this correctly and this likely explains the somewhat protracted course to heal this fissure.  Follow-up in 6 months, sooner necessary 40 minutes spent with the patient today. Greater than 50% was spent in counseling and coordination of care with the patient

## 2016-08-18 NOTE — Patient Instructions (Signed)
Continue on your Humira every 14 days.  Note: Dr. Hilarie Fredrickson would recommend discussing with Dr. Sabra Heck switching to [facility] during your pregnancy.  Normal BMI (Body Mass Index- based on height and weight) is between 19 and 25. Your BMI today is Body mass index is 23.89 kg/m. Marland Kitchen Please consider follow up  regarding your BMI with your Primary Care Provider.   Please follow up with Dr. Hilarie Fredrickson in 6 months.

## 2016-08-19 ENCOUNTER — Other Ambulatory Visit (HOSPITAL_BASED_OUTPATIENT_CLINIC_OR_DEPARTMENT_OTHER): Payer: Commercial Managed Care - PPO

## 2016-08-19 ENCOUNTER — Ambulatory Visit (HOSPITAL_BASED_OUTPATIENT_CLINIC_OR_DEPARTMENT_OTHER): Payer: Commercial Managed Care - PPO | Admitting: Family

## 2016-08-19 VITALS — BP 114/76 | HR 72 | Temp 98.5°F | Resp 16 | Wt 139.0 lb

## 2016-08-19 DIAGNOSIS — K909 Intestinal malabsorption, unspecified: Secondary | ICD-10-CM

## 2016-08-19 DIAGNOSIS — D500 Iron deficiency anemia secondary to blood loss (chronic): Secondary | ICD-10-CM

## 2016-08-19 DIAGNOSIS — K50918 Crohn's disease, unspecified, with other complication: Secondary | ICD-10-CM | POA: Diagnosis not present

## 2016-08-19 DIAGNOSIS — D508 Other iron deficiency anemias: Secondary | ICD-10-CM

## 2016-08-19 DIAGNOSIS — K50819 Crohn's disease of both small and large intestine with unspecified complications: Secondary | ICD-10-CM

## 2016-08-19 LAB — CBC WITH DIFFERENTIAL (CANCER CENTER ONLY)
BASO#: 0 10*3/uL (ref 0.0–0.2)
BASO%: 0.4 % (ref 0.0–2.0)
EOS ABS: 0.2 10*3/uL (ref 0.0–0.5)
EOS%: 2.6 % (ref 0.0–7.0)
HCT: 38.7 % (ref 34.8–46.6)
HEMOGLOBIN: 12.9 g/dL (ref 11.6–15.9)
LYMPH#: 3.4 10*3/uL — ABNORMAL HIGH (ref 0.9–3.3)
LYMPH%: 39.9 % (ref 14.0–48.0)
MCH: 29.5 pg (ref 26.0–34.0)
MCHC: 33.3 g/dL (ref 32.0–36.0)
MCV: 88 fL (ref 81–101)
MONO#: 0.8 10*3/uL (ref 0.1–0.9)
MONO%: 9.3 % (ref 0.0–13.0)
NEUT%: 47.8 % (ref 39.6–80.0)
NEUTROS ABS: 4.1 10*3/uL (ref 1.5–6.5)
PLATELETS: 295 10*3/uL (ref 145–400)
RBC: 4.38 10*6/uL (ref 3.70–5.32)
RDW: 12.9 % (ref 11.1–15.7)
WBC: 8.5 10*3/uL (ref 3.9–10.0)

## 2016-08-19 NOTE — Progress Notes (Signed)
Hematology and Oncology Follow Up Visit  Carla Clark [id number] 07-29-1985 30 y.o. 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 She is doing well and states that she has not had a recent flare with her Crohn's disease.  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  She had a colonoscopy in June and had 1 polyp removed that was precancerous.  She has had no episodes of bleeding, bruising or petechiae. No lymphadenopathy found on exam.  No fever, chills, n/v, cough, rash, dizziness, SOB, chest pain, palpitations, abdominal pain or changes in bowel or bladder habits.  No swelling, tenderness, numbness or tingling. No c/o pain at this time.  She has maintained a good appetite and avoids foods that exacerbate her GI symptoms. She is staying well hydrated and weight is stable at this time.   ECOG Performance Status: 1 - Symptomatic but completely ambulatory  Medications:  Allergies as of 08/19/2016   No Known Allergies     Medication List       Accurate as of 08/19/16  4:26 PM. Always use your most recent med list.          Adalimumab 40 MG/0.8ML Pnkt every 14 (fourteen) days.   clobetasol ointment 0.05 % Commonly known as:  TEMOVATE Apply 1 application topically 2 (two) times daily. Apply as directed twice daily   Cyanocobalamin 500 MCG/0.1ML Soln Commonly known as:  NASCOBAL 1 spray in 1 nostril once per week   EUCRISA 2 % Oint Generic drug:  Crisaborole   famotidine 20 MG tablet Commonly known as:  PEPCID Take 1 tablet (20 mg total) by mouth 2 (two) times daily.   hyoscyamine 0.125 MG SL tablet Commonly known as:  LEVSIN SL Take 1-2 tablets by mouth every 6 hours as needed for spasm/pain   Misc Intestinal  Flora Regulat Chew Chew by mouth every other day. Digestive Health probiotic gummies by Schiff   SYRINGE-NEEDLE (DISP) 3 ML 25G X 5/8" 3 ML Misc Use as directed for B12 injections.   VIORELE 0.15-0.02/0.01 MG (21/5) tablet Generic drug:  desogestrel-ethinyl estradiol TAKE 1 TABLET BY MOUTH EVERY DAY (NEED APPT)       Allergies: No Known Allergies  Past Medical History, Surgical history, Social history, and Family History were reviewed and updated.  Review of Systems: All other 10 point review of systems is negative.   Physical Exam:  weight is 139 lb (63 kg). Her oral temperature is 98.5 F (36.9 C). Her blood pressure is 114/76 and her pulse is 72. Her respiration is 16 and oxygen saturation is 100%.   Wt Readings from Last 3 Encounters:  08/19/16 139 lb (63 kg)  08/18/16 137 lb (62.1 kg)  06/24/16 136 lb (61.7 kg)    Ocular: Sclerae unicteric, pupils equal, round and reactive to light Ear-nose-throat: Oropharynx clear, dentition fair Lymphatic: No cervical, supraclavicular or axillary adenopathy Lungs no rales or rhonchi, good excursion bilaterally Heart regular rate and rhythm, no murmur appreciated Abd soft, nontender, positive bowel sounds, no liver or spleen tip palpated on exam MSK no focal spinal tenderness, no joint edema Neuro: non-focal, well-oriented, appropriate affect Breasts: Deferred   Lab Results  Component Value Date   WBC 8.5 08/19/2016  HGB 12.9 08/19/2016   HCT 38.7 08/19/2016   MCV 88 08/19/2016   PLT 295 08/19/2016   Lab Results  Component Value Date   FERRITIN 55 04/15/2016   IRON 82 04/15/2016   TIBC 289 04/15/2016   UIBC 207 04/15/2016   IRONPCTSAT 28 04/15/2016   Lab Results  Component Value Date   RETICCTPCT 0.3 (L) 01/20/2015   RBC 4.38 08/19/2016   RETICCTABS 13.7 (L) 01/20/2015   No results found for: KPAFRELGTCHN, LAMBDASER, [facility] Lab Results  Component Value Date   IGA 187 03/11/2016   No results found for:  [name], SPEI   Chemistry      Component Value Date/Time   NA 138 03/11/2016 1145   K 4.1 03/11/2016 1145   CL 106 03/11/2016 1145   CO2 26 03/11/2016 1145   BUN 7 03/11/2016 1145   CREATININE 0.72 03/11/2016 1145   CREATININE 0.62 08/20/2012 1139      Component Value Date/Time   CALCIUM 9.4 03/11/2016 1145   ALKPHOS 43 03/11/2016 1145   AST 14 03/11/2016 1145   ALT 14 03/11/2016 1145   BILITOT 0.5 03/11/2016 1145      Impression and Plan: Carla Clark is a very pleasant 31 yo caucasian female with iron deficiency anemia secondary to chronic GI blood loss/malabsorption with Crohn's disease. She is doing well and has had no issues with GI flares or bleeding since we last saw her.  We will see what her iron studies show and bring her back in next week for an infusion if needed.   We will go ahead and plan to see her back again in another 4 months for repeat lab work and follow-up.  She will contact our office with any questions or concerns. We can certainly see her sooner if need be.   [name], NP 7/27/20184:26 PM

## 2016-08-20 LAB — RETICULOCYTES: Reticulocyte Count: 0.6 % (ref 0.6–2.6)

## 2016-08-22 ENCOUNTER — Encounter: Payer: Self-pay | Admitting: Family Medicine

## 2016-08-22 ENCOUNTER — Ambulatory Visit (INDEPENDENT_AMBULATORY_CARE_PROVIDER_SITE_OTHER): Payer: Commercial Managed Care - PPO | Admitting: Family Medicine

## 2016-08-22 VITALS — BP 109/61 | HR 66 | Temp 98.0°F | Resp 18 | Ht 64.0 in | Wt 138.6 lb

## 2016-08-22 DIAGNOSIS — K5000 Crohn's disease of small intestine without complications: Secondary | ICD-10-CM | POA: Diagnosis not present

## 2016-08-22 DIAGNOSIS — D508 Other iron deficiency anemias: Secondary | ICD-10-CM | POA: Diagnosis not present

## 2016-08-22 DIAGNOSIS — Z860100 Personal history of colon polyps, unspecified: Secondary | ICD-10-CM

## 2016-08-22 DIAGNOSIS — L309 Dermatitis, unspecified: Secondary | ICD-10-CM | POA: Diagnosis not present

## 2016-08-22 DIAGNOSIS — N912 Amenorrhea, unspecified: Secondary | ICD-10-CM | POA: Diagnosis not present

## 2016-08-22 DIAGNOSIS — E538 Deficiency of other specified B group vitamins: Secondary | ICD-10-CM | POA: Diagnosis not present

## 2016-08-22 DIAGNOSIS — Z0000 Encounter for general adult medical examination without abnormal findings: Secondary | ICD-10-CM

## 2016-08-22 DIAGNOSIS — Z0001 Encounter for general adult medical examination with abnormal findings: Secondary | ICD-10-CM | POA: Diagnosis not present

## 2016-08-22 HISTORY — DX: Dermatitis, unspecified: L30.9

## 2016-08-22 LAB — IRON AND TIBC
%SAT: 38 % (ref 21–57)
Iron: 109 ug/dL (ref 41–142)
TIBC: 283 ug/dL (ref 236–444)
UIBC: 174 ug/dL (ref 120–384)

## 2016-08-22 LAB — FERRITIN: Ferritin: 45 ng/ml (ref 9–269)

## 2016-08-22 MED ORDER — CLOBETASOL PROPIONATE 0.05 % EX OINT: 1.0000 "application " | TOPICAL_OINTMENT | Freq: Two times a day (BID) | CUTANEOUS | 1 refills | Status: DC

## 2016-08-22 NOTE — Assessment & Plan Note (Signed)
Works with Dr Hilarie Fredrickson

## 2016-08-22 NOTE — Assessment & Plan Note (Signed)
Patient encouraged to maintain heart healthy diet, regular exercise, adequate sleep. Consider daily probiotics. Take medications as prescribed 

## 2016-08-22 NOTE — Assessment & Plan Note (Signed)
Check Vitamin B 12 today

## 2016-08-22 NOTE — Progress Notes (Signed)
Subjective:  I acted as a Education administrator for Dr. Charlett Blake. [name], [place]  Patient ID: Carla Clark, female    DOB: 01-03-1986, 31 y.o.   MRN: [id number]  Chief Complaint  Patient presents with  . Annual Exam    HPI  Patient is in today for an annual exam. She feels well today. No recent febrile illness or hospitalizations. She is struggling with perioral and ear dermatitis. She has tried some OTC products with minimal relief. She is following with gastroenterology for her crohn's. No recent flares. She is 2 weeks over due for her period and had a negative home pregnancy test. Is eating well and exercising regularly. Her job is going well. Denies CP/palp/SOB/HA/congestion/fevers/GI or GU c/o. Taking meds as prescribed   Patient Care Team: Mosie Lukes, MD as PCP - General (Family Medicine)   Past Medical History:  Diagnosis Date  . Anemia 11/2011   iron deficiency. 01/2013 parenteral iron. Intranasal B12.  Dr Marin Olp follows.   . B12 deficiency 11/2012  . Breast mass in female 04/21/2012  . Cervical cancer screening 01/27/2012  . Chicken pox as a child  . Crohn's disease ([place]) 2013   ileitis 10/2011. ileitis, mesenteric abscess, ? entero-enteric fistula on 08/2012 CT enterography  . Dermatitis 08/22/2016  . GERD (gastroesophageal reflux disease)   . Hepatitis B immune 07/2012   previous vaccination.   Mindi Slicker 09/2014   per Dermatologist.  Rx Zyrtec, Benadryl.   . Small bowel obstruction ([place])   . Vitamin D deficiency 2014    Past Surgical History:  Procedure Laterality Date  . WISDOM TOOTH EXTRACTION  31 yrs old    Family History  Problem Relation Age of Onset  . Nephrolithiasis Father   . Lung cancer Maternal Grandmother   . Gout Maternal Grandfather   . Ovarian cancer Paternal Grandmother   . Cancer Paternal Grandmother 34       ovarian  . Pancreatic cancer Paternal Grandfather   . Ovarian cancer Paternal Aunt        ovarian  . Other Paternal Uncle 29       cyst removed from  pancreas  . Pancreatic cancer Paternal Uncle        Stage 1    Social History   Social History  . Marital status: Married    Spouse name: N/A  . Number of children: N/A  . Years of education: N/A   Occupational History  . teacher [employer] History Main Topics  . Smoking status: Never Smoker  . Smokeless tobacco: Never Used     Comment: never used tobacco  . Alcohol use 0.0 oz/week     Comment: occasionaly  . Drug use: No  . Sexual activity: Yes    Partners: Male    Birth control/ protection: Pill     Comment: lives with husband, works at [employer] at [employer]  . Not on file   Social History Narrative  . No narrative on file    Outpatient Medications Prior to Visit  Medication Sig Dispense Refill  . Adalimumab 40 MG/0.8ML PNKT every 14 (fourteen) days.     . Cyanocobalamin (NASCOBAL) 500 MCG/0.1ML SOLN 1 spray in 1 nostril once per week 1.3 mL 2  . EUCRISA 2 % OINT     . famotidine (PEPCID) 20 MG tablet Take 1 tablet (20 mg total) by mouth 2 (two) times daily. 60 tablet 3  . hyoscyamine (LEVSIN SL)  0.125 MG SL tablet Take 1-2 tablets by mouth every 6 hours as needed for spasm/pain 60 tablet 3  . Probiotic Product (MISC INTESTINAL FLORA REGULAT) CHEW Chew by mouth every other day. Digestive Health probiotic gummies by Schiff    . SYRINGE-NEEDLE, DISP, 3 ML 25G X 5/8" 3 ML MISC Use as directed for B12 injections. 50 each 0  . VIORELE 0.15-0.02/0.01 MG (21/5) tablet TAKE 1 TABLET BY MOUTH EVERY DAY (NEED APPT) 84 tablet 0  . clobetasol ointment (TEMOVATE) 5.40 % Apply 1 application topically 2 (two) times daily. Apply as directed twice daily 60 g 1  . 0.9 %  sodium chloride infusion      No facility-administered medications prior to visit.     No Known Allergies  Review of Systems  Constitutional: Negative for chills, fever and malaise/fatigue.  HENT: Negative for congestion and hearing loss.   Eyes: Negative for discharge.    Respiratory: Negative for cough, sputum production and shortness of breath.   Cardiovascular: Negative for chest pain, palpitations and leg swelling.  Gastrointestinal: Negative for abdominal pain, blood in stool, constipation, diarrhea, heartburn, nausea and vomiting.  Genitourinary: Negative for dysuria, frequency, hematuria and urgency.  Musculoskeletal: Negative for back pain, falls and myalgias.  Skin: Positive for rash.  Neurological: Negative for dizziness, sensory change, loss of consciousness, weakness and headaches.  Endo/Heme/Allergies: Negative for environmental allergies. Does not bruise/bleed easily.  Psychiatric/Behavioral: Negative for depression and suicidal ideas. The patient is not nervous/anxious and does not have insomnia.        Objective:    Physical Exam  Constitutional: She is oriented to person, place, and time. She appears well-developed and well-nourished. No distress.  HENT:  Head: Normocephalic and atraumatic.  Eyes: Conjunctivae are normal.  Neck: Neck supple. No thyromegaly present.  Cardiovascular: Normal rate, regular rhythm and normal heart sounds.   No murmur heard. Pulmonary/Chest: Effort normal and breath sounds normal. No respiratory distress.  Abdominal: Soft. Bowel sounds are normal. She exhibits no distension and no mass. There is no tenderness.  Musculoskeletal: She exhibits no edema.  Lymphadenopathy:    She has no cervical adenopathy.  Neurological: She is alert and oriented to person, place, and time.  Skin: Skin is warm and dry. Rash noted.  Dry, scaly skin at b/l corners with erythema.  Psychiatric: She has a normal mood and affect. Her behavior is normal.    BP 109/61 (BP Location: Left Arm, Patient Position: Sitting, Cuff Size: Normal)   Pulse 66   Temp 98 F (36.7 C) (Oral)   Resp 18   Ht 5' 4"  (1.626 m)   Wt 138 lb 9.6 oz (62.9 kg)   SpO2 99%   BMI 23.79 kg/m  Wt Readings from Last 3 Encounters:  08/22/16 138 lb 9.6 oz  (62.9 kg)  08/19/16 139 lb (63 kg)  08/18/16 137 lb (62.1 kg)   BP Readings from Last 3 Encounters:  08/22/16 109/61  08/19/16 114/76  08/18/16 100/68     Immunization History  Administered Date(s) Administered  . Hepatitis B 10/21/1998, 10/25/1998, 04/21/1999  . Influenza,inj,Quad PF,36+ Mos 11/06/2013  . Influenza,inj,quad, With Preservative 12/03/2012  . Influenza-Unspecified 10/25/2015  . PPD Test 08/31/2012  . Pneumococcal Polysaccharide-23 12/03/2012  . Td 04/21/1999  . Tdap 01/20/2014    Health Maintenance  Topic Date Due  . HIV Screening  08/30/2002  . INFLUENZA VACCINE  08/24/2016  . PAP SMEAR  08/18/2018  . COLONOSCOPY  06/25/2019  . TETANUS/TDAP  01/21/2024  Lab Results  Component Value Date   WBC 7.3 08/23/2016   HGB 12.9 08/23/2016   HCT 39.8 08/23/2016   PLT 296.0 08/23/2016   GLUCOSE 83 08/23/2016   CHOL 139 08/23/2016   TRIG 78.0 08/23/2016   HDL 54.50 08/23/2016   LDLCALC 69 08/23/2016   ALT 14 08/23/2016   AST 15 08/23/2016   NA 135 08/23/2016   K 4.0 08/23/2016   CL 106 08/23/2016   CREATININE 0.76 08/23/2016   BUN 9 08/23/2016   CO2 25 08/23/2016   TSH 2.38 08/23/2016    Lab Results  Component Value Date   TSH 2.38 08/23/2016   Lab Results  Component Value Date   WBC 7.3 08/23/2016   HGB 12.9 08/23/2016   HCT 39.8 08/23/2016   MCV 89.2 08/23/2016   PLT 296.0 08/23/2016   Lab Results  Component Value Date   NA 135 08/23/2016   K 4.0 08/23/2016   CO2 25 08/23/2016   GLUCOSE 83 08/23/2016   BUN 9 08/23/2016   CREATININE 0.76 08/23/2016   BILITOT 0.6 08/23/2016   ALKPHOS 49 08/23/2016   AST 15 08/23/2016   ALT 14 08/23/2016   PROT 7.2 08/23/2016   ALBUMIN 4.0 08/23/2016   CALCIUM 9.5 08/23/2016   ANIONGAP 7 04/18/2015   GFR 95.64 08/23/2016   Lab Results  Component Value Date   CHOL 139 08/23/2016   Lab Results  Component Value Date   HDL 54.50 08/23/2016   Lab Results  Component Value Date   LDLCALC 69  08/23/2016   Lab Results  Component Value Date   TRIG 78.0 08/23/2016   Lab Results  Component Value Date   CHOLHDL 3 08/23/2016   No results found for: HGBA1C       Assessment & Plan:   Problem List Items Addressed This Visit    Other iron deficiency anemia (Chronic)    Increase leafy greens, consider increased lean red meat and using cast iron cookware. Continue to monitor, report any concerns      Relevant Orders   CBC (Completed)   Preventative health care    Patient encouraged to maintain heart healthy diet, regular exercise, adequate sleep. Consider daily probiotics. Take medications as prescribed      Relevant Orders   Lipid panel (Completed)   CBC (Completed)   Comprehensive metabolic panel (Completed)   TSH (Completed)   Crohn's disease ([place])    Works with Dr Hilarie Fredrickson      Relevant Orders   Vitamin B12 (Completed)   B12 deficiency    Check Vitamin B 12 today      Relevant Orders   Vitamin B12 (Completed)   History of colon polyps    Adenoma, serrated polyp, repeat colonoscopy in 3 years.       Dermatitis    Perioral and intears. Temovate helps intermittently. Encouraged to continue prn use. Add a multivitamin with minerals and cleanse area with witch hazel before applying steroids       Other Visit Diagnoses    Amenorrhea    -  Primary   Relevant Orders   Pregnancy, urine (Completed)      I am having Ms. Floren maintain her Misc Intestinal Flora Regulat, famotidine, hyoscyamine, SYRINGE-NEEDLE (DISP) 3 ML, Adalimumab, VIORELE, Cyanocobalamin, EUCRISA, and clobetasol ointment. We will stop administering sodium chloride.  Meds ordered this encounter  Medications  . clobetasol ointment (TEMOVATE) 0.05 %    Sig: Apply 1 application topically 2 (two) times daily. Apply as  directed twice daily    Dispense:  60 g    Refill:  1    CMA served as scribe during this visit. History, Physical and Plan performed by medical provider. Documentation and  orders reviewed and attested to.  Penni Homans, MD

## 2016-08-22 NOTE — Assessment & Plan Note (Signed)
Increase leafy greens, consider increased lean red meat and using cast iron cookware. Continue to monitor, report any concerns 

## 2016-08-22 NOTE — Assessment & Plan Note (Addendum)
Perioral and in ears. Temovate helps intermittently. Encouraged to continue prn use. Add a multivitamin with minerals and cleanse area with witch hazel before applying steroids

## 2016-08-22 NOTE — Assessment & Plan Note (Signed)
Adenoma, serrated polyp, repeat colonoscopy in 3 years.

## 2016-08-22 NOTE — Patient Instructions (Signed)
MV with minerals and at least 465 mcg of Folic Acid Preventive Care 18-39 Years, Female Preventive care refers to lifestyle choices and visits with your health care provider that can promote health and wellness. What does preventive care include?  A yearly physical exam. This is also called an annual well check.  Dental exams once or twice a year.  Routine eye exams. Ask your health care provider how often you should have your eyes checked.  Personal lifestyle choices, including: ? Daily care of your teeth and gums. ? Regular physical activity. ? Eating a healthy diet. ? Avoiding tobacco and drug use. ? Limiting alcohol use. ? Practicing safe sex. ? Taking vitamin and mineral supplements as recommended by your health care provider. What happens during an annual well check? The services and screenings done by your health care provider during your annual well check will depend on your age, overall health, lifestyle risk factors, and family history of disease. Counseling Your health care provider may ask you questions about your:  Alcohol use.  Tobacco use.  Drug use.  Emotional well-being.  Home and relationship well-being.  Sexual activity.  Eating habits.  Work and work Statistician.  Method of birth control.  Menstrual cycle.  Pregnancy history.  Screening You may have the following tests or measurements:  Height, weight, and BMI.  Diabetes screening. This is done by checking your blood sugar (glucose) after you have not eaten for a while (fasting).  Blood pressure.  Lipid and cholesterol levels. These may be checked every 5 years starting at age 70.  Skin check.  Hepatitis C blood test.  Hepatitis B blood test.  Sexually transmitted disease (STD) testing.  BRCA-related cancer screening. This may be done if you have a family history of breast, ovarian, tubal, or peritoneal cancers.  Pelvic exam and Pap test. This may be done every 3 years starting at  age 84. Starting at age 64, this may be done every 5 years if you have a Pap test in combination with an HPV test.  Discuss your test results, treatment options, and if necessary, the need for more tests with your health care provider. Vaccines Your health care provider may recommend certain vaccines, such as:  Influenza vaccine. This is recommended every year.  Tetanus, diphtheria, and acellular pertussis (Tdap, Td) vaccine. You may need a Td booster every 10 years.  Varicella vaccine. You may need this if you have not been vaccinated.  HPV vaccine. If you are 85 or younger, you may need three doses over 6 months.  Measles, mumps, and rubella (MMR) vaccine. You may need at least one dose of MMR. You may also need a second dose.  Pneumococcal 13-valent conjugate (PCV13) vaccine. You may need this if you have certain conditions and were not previously vaccinated.  Pneumococcal polysaccharide (PPSV23) vaccine. You may need one or two doses if you smoke cigarettes or if you have certain conditions.  Meningococcal vaccine. One dose is recommended if you are age 84-21 years and a first-year college student living in a residence hall, or if you have one of several medical conditions. You may also need additional booster doses.  Hepatitis A vaccine. You may need this if you have certain conditions or if you travel or work in places where you may be exposed to hepatitis A.  Hepatitis B vaccine. You may need this if you have certain conditions or if you travel or work in places where you may be exposed to hepatitis B.  Haemophilus influenzae type b (Hib) vaccine. You may need this if you have certain risk factors.  Talk to your health care provider about which screenings and vaccines you need and how often you need them. This information is not intended to replace advice given to you by your health care provider. Make sure you discuss any questions you have with your health care  provider. Document Released: 03/08/2001 Document Revised: 09/30/2015 Document Reviewed: 11/11/2014 Elsevier Interactive Patient Education  2017 [employer].

## 2016-08-23 ENCOUNTER — Other Ambulatory Visit (INDEPENDENT_AMBULATORY_CARE_PROVIDER_SITE_OTHER): Payer: Commercial Managed Care - PPO

## 2016-08-23 DIAGNOSIS — E538 Deficiency of other specified B group vitamins: Secondary | ICD-10-CM

## 2016-08-23 DIAGNOSIS — K5000 Crohn's disease of small intestine without complications: Secondary | ICD-10-CM | POA: Diagnosis not present

## 2016-08-23 DIAGNOSIS — N912 Amenorrhea, unspecified: Secondary | ICD-10-CM

## 2016-08-23 DIAGNOSIS — D508 Other iron deficiency anemias: Secondary | ICD-10-CM | POA: Diagnosis not present

## 2016-08-23 DIAGNOSIS — Z0000 Encounter for general adult medical examination without abnormal findings: Secondary | ICD-10-CM | POA: Diagnosis not present

## 2016-08-23 LAB — COMPREHENSIVE METABOLIC PANEL
ALT: 14 U/L (ref 0–35)
AST: 15 U/L (ref 0–37)
Albumin: 4 g/dL (ref 3.5–5.2)
Alkaline Phosphatase: 49 U/L (ref 39–117)
BILIRUBIN TOTAL: 0.6 mg/dL (ref 0.2–1.2)
BUN: 9 mg/dL (ref 6–23)
CHLORIDE: 106 meq/L (ref 96–112)
CO2: 25 meq/L (ref 19–32)
CREATININE: 0.76 mg/dL (ref 0.40–1.20)
Calcium: 9.5 mg/dL (ref 8.4–10.5)
GFR: 95.64 mL/min (ref >60.00)
Glucose, Bld: 83 mg/dL (ref 70–99)
Potassium: 4 mEq/L (ref 3.5–5.1)
SODIUM: 135 meq/L (ref 135–145)
Total Protein: 7.2 g/dL (ref 6.0–8.3)

## 2016-08-23 LAB — CBC
HCT: 39.8 % (ref 36.0–46.0)
Hemoglobin: 12.9 g/dL (ref 12.0–15.0)
MCHC: 32.5 g/dL (ref 30.0–36.0)
MCV: 89.2 fl (ref 78.0–100.0)
PLATELETS: 296 10*3/uL (ref 150.0–400.0)
RBC: 4.46 Mil/uL (ref 3.87–5.11)
RDW: 13.3 % (ref 11.5–15.5)
WBC: 7.3 10*3/uL (ref 4.0–10.5)

## 2016-08-23 LAB — VITAMIN B12: Vitamin B-12: 446 pg/mL (ref 211–911)

## 2016-08-23 LAB — LIPID PANEL
CHOL/HDL RATIO: 3
Cholesterol: 139 mg/dL (ref 0–200)
HDL: 54.5 mg/dL (ref >39.00)
LDL CALC: 69 mg/dL (ref 0–99)
NONHDL: 84.54
TRIGLYCERIDES: 78 mg/dL (ref 0.0–149.0)
VLDL: 15.6 mg/dL (ref 0.0–40.0)

## 2016-08-23 LAB — PREGNANCY, URINE: Preg Test, Ur: NEGATIVE

## 2016-08-23 LAB — TSH: TSH: 2.38 u[IU]/mL (ref 0.35–4.50)

## 2016-08-24 ENCOUNTER — Other Ambulatory Visit: Payer: Self-pay

## 2016-08-24 DIAGNOSIS — K50918 Crohn's disease, unspecified, with other complication: Secondary | ICD-10-CM

## 2016-08-24 DIAGNOSIS — N912 Amenorrhea, unspecified: Secondary | ICD-10-CM | POA: Insufficient documentation

## 2016-08-24 NOTE — Assessment & Plan Note (Signed)
Pregnancy test negative. Encouraged to take Folic Acid 1 mg daily

## 2016-09-30 ENCOUNTER — Encounter: Payer: Self-pay | Admitting: Family Medicine

## 2016-10-02 ENCOUNTER — Other Ambulatory Visit: Payer: Self-pay | Admitting: Family Medicine

## 2016-10-02 DIAGNOSIS — L309 Dermatitis, unspecified: Secondary | ICD-10-CM

## 2016-11-24 ENCOUNTER — Telehealth: Payer: Self-pay | Admitting: Internal Medicine

## 2016-11-24 ENCOUNTER — Other Ambulatory Visit: Payer: Self-pay | Admitting: Internal Medicine

## 2016-11-24 MED ORDER — ADALIMUMAB 40 MG/0.4ML ~~LOC~~ AJKT: 40.0000 mg | AUTO-INJECTOR | SUBCUTANEOUS | 11 refills | Status: DC

## 2016-11-24 NOTE — Telephone Encounter (Signed)
Script already sent to pharmacy.

## 2016-11-24 NOTE — Telephone Encounter (Signed)
Prescription refilled and sent to the pharmacy.

## 2017-01-11 ENCOUNTER — Other Ambulatory Visit (HOSPITAL_BASED_OUTPATIENT_CLINIC_OR_DEPARTMENT_OTHER): Payer: Commercial Managed Care - PPO

## 2017-01-11 ENCOUNTER — Ambulatory Visit (HOSPITAL_BASED_OUTPATIENT_CLINIC_OR_DEPARTMENT_OTHER): Payer: Commercial Managed Care - PPO | Admitting: Family

## 2017-01-11 ENCOUNTER — Other Ambulatory Visit: Payer: Self-pay

## 2017-01-11 VITALS — BP 111/66 | HR 74 | Temp 97.7°F | Resp 20 | Wt 139.8 lb

## 2017-01-11 DIAGNOSIS — D508 Other iron deficiency anemias: Secondary | ICD-10-CM

## 2017-01-11 DIAGNOSIS — K50918 Crohn's disease, unspecified, with other complication: Secondary | ICD-10-CM

## 2017-01-11 DIAGNOSIS — D500 Iron deficiency anemia secondary to blood loss (chronic): Secondary | ICD-10-CM

## 2017-01-11 DIAGNOSIS — K909 Intestinal malabsorption, unspecified: Secondary | ICD-10-CM

## 2017-01-11 LAB — CBC WITH DIFFERENTIAL (CANCER CENTER ONLY)
BASO#: 0 10*3/uL (ref 0.0–0.2)
BASO%: 0.3 % (ref 0.0–2.0)
EOS ABS: 0.2 10*3/uL (ref 0.0–0.5)
EOS%: 2.2 % (ref 0.0–7.0)
HCT: 36.7 % (ref 34.8–46.6)
HGB: 12 g/dL (ref 11.6–15.9)
LYMPH#: 2.9 10*3/uL (ref 0.9–3.3)
LYMPH%: 31.9 % (ref 14.0–48.0)
MCH: 29.2 pg (ref 26.0–34.0)
MCHC: 32.7 g/dL (ref 32.0–36.0)
MCV: 89 fL (ref 81–101)
MONO#: 0.8 10*3/uL (ref 0.1–0.9)
MONO%: 8.2 % (ref 0.0–13.0)
NEUT#: 5.2 10*3/uL (ref 1.5–6.5)
NEUT%: 57.4 % (ref 39.6–80.0)
PLATELETS: 313 10*3/uL (ref 145–400)
RBC: 4.11 10*6/uL (ref 3.70–5.32)
RDW: 12.7 % (ref 11.1–15.7)
WBC: 9.1 10*3/uL (ref 3.9–10.0)

## 2017-01-11 NOTE — Progress Notes (Signed)
Hematology and Oncology Follow Up Visit  Carla Clark [id number] [birth year] 31 y.o. 01/11/2017   Principle Diagnosis:  Iron deficiency anemia secondary to chronic blood loss due to Crohn's disease  Current Therapy:   IV iron as indicated - last received in August 2017   Interim History:  Carla Clark is here today for follow-up. She is excited for winter break and Christmas.  She has had no recent flare with her Crohn's. She states that her energy is good. She is not fatiguing easily.  Her cycle has been regular and not too heavy. She does have cramping. No there bleeding, no bruising or petechiae.  No lymphadenopathy found on exam.  No fever, chills, n/v, cough,r ash, dizziness, SOB< chest pain, palpitations, abdominal pain or changes in bowel or bladder habits.  No swelling, tenderness, numbness or tingling in her extremities. No c/o pain at this time.  She has maintained a good appetite and is staying well hydrated. Her weight is stable.   ECOG Performance Status: 0 - Asymptomatic  Medications:  Allergies as of 01/11/2017   No Known Allergies     Medication List        Accurate as of 01/11/17  4:00 PM. Always use your most recent med list.          Adalimumab 40 MG/0.8ML Pnkt every 14 (fourteen) days.   Adalimumab 40 MG/0.4ML Pnkt Commonly known as:  HUMIRA PEN Inject 40 mg into the skin every 14 (fourteen) days.   clobetasol ointment 0.05 % Commonly known as:  TEMOVATE Apply 1 application topically 2 (two) times daily. Apply as directed twice daily   Cyanocobalamin 500 MCG/0.1ML Soln Commonly known as:  NASCOBAL 1 spray in 1 nostril once per week   EUCRISA 2 % Oint Generic drug:  Crisaborole   famotidine 20 MG tablet Commonly known as:  PEPCID Take 1 tablet (20 mg total) by mouth 2 (two) times daily.   hyoscyamine 0.125 MG SL tablet Commonly known as:  LEVSIN SL Take 1-2 tablets by mouth every 6 hours as needed for spasm/pain   Misc Intestinal Flora  Regulat Chew Chew by mouth every other day. Digestive Health probiotic gummies by [employer]   SYRINGE-NEEDLE (DISP) 3 ML 25G X 5/8" 3 ML Misc Use as directed for B12 injections.   VIORELE 0.15-0.02/0.01 MG (21/5) tablet Generic drug:  desogestrel-ethinyl estradiol TAKE 1 TABLET BY MOUTH EVERY DAY (NEED APPT)       Allergies: No Known Allergies  Past Medical History, Surgical history, Social history, and Family History were reviewed and updated.  Review of Systems: All other 10 point review of systems is negative.   Physical Exam:  weight is 139 lb 12 oz (63.4 kg). Her oral temperature is 97.7 F (36.5 C). Her blood pressure is 111/66 and her pulse is 74. Her respiration is 20 and oxygen saturation is 100%.   Wt Readings from Last 3 Encounters:  01/11/17 139 lb 12 oz (63.4 kg)  08/22/16 138 lb 9.6 oz (62.9 kg)  08/19/16 139 lb (63 kg)    Ocular: Sclerae unicteric, pupils equal, round and reactive to light Ear-nose-throat: Oropharynx clear, dentition fair Lymphatic: No cervical, supraclavicular or axillary adenopathy Lungs no rales or rhonchi, good excursion bilaterally Heart regular rate and rhythm, no murmur appreciated Abd soft, nontender, positive bowel sounds, no liver or spleen tip palpated on exam, no fluid wave  MSK no focal spinal tenderness, no joint edema Neuro: non-focal, well-oriented, appropriate affect Breasts: Deferred   Lab  Results  Component Value Date   WBC 9.1 01/11/2017   HGB 12.0 01/11/2017   HCT 36.7 01/11/2017   MCV 89 01/11/2017   PLT 313 01/11/2017   Lab Results  Component Value Date   FERRITIN 45 08/19/2016   IRON 109 08/19/2016   TIBC 283 08/19/2016   UIBC 174 08/19/2016   IRONPCTSAT 38 08/19/2016   Lab Results  Component Value Date   RETICCTPCT 0.3 (L) 01/20/2015   RBC 4.11 01/11/2017   RETICCTABS 13.7 (L) 01/20/2015   No results found for: KPAFRELGTCHN, LAMBDASER, [facility] Lab Results  Component Value Date   IGA 187  03/11/2016   No results found for: [name], SPEI   Chemistry      Component Value Date/Time   NA 135 08/23/2016 0859   K 4.0 08/23/2016 0859   CL 106 08/23/2016 0859   CO2 25 08/23/2016 0859   BUN 9 08/23/2016 0859   CREATININE 0.76 08/23/2016 0859   CREATININE 0.62 08/20/2012 1139      Component Value Date/Time   CALCIUM 9.5 08/23/2016 0859   ALKPHOS 49 08/23/2016 0859   AST 15 08/23/2016 0859   ALT 14 08/23/2016 0859   BILITOT 0.6 08/23/2016 0859      Impression and Plan: Carla Clark is a very pleasant 31 yo caucasian female with iron deficiency anemia secondary to chronic GI blood loss and malabsorption associated with Crohn's disease. She continues to do well and has no complaints at this time.  We will see what her iron studies show and bring her back in next week for an infusion if needed. We will plan to see her back again in another 4 months for follow-up and lab.  She will contact our office with any questions or concerns. We can certainly see her sooner if need be.   [name], NP 12/19/20184:00 PM

## 2017-01-12 LAB — IRON AND TIBC
%SAT: 23 % (ref 21–57)
IRON: 70 ug/dL (ref 41–142)
TIBC: 299 ug/dL (ref 236–444)
UIBC: 229 ug/dL (ref 120–384)

## 2017-01-12 LAB — FERRITIN: Ferritin: 25 ng/ml (ref 9–269)

## 2017-01-12 LAB — RETICULOCYTES: RETICULOCYTE COUNT: 0.8 % (ref 0.6–2.6)

## 2017-03-28 ENCOUNTER — Other Ambulatory Visit: Payer: Self-pay

## 2017-03-28 ENCOUNTER — Other Ambulatory Visit (HOSPITAL_COMMUNITY)
Admission: RE | Admit: 2017-03-28 | Discharge: 2017-03-28 | Disposition: A | Discharge status: Home / Self Care | Payer: Commercial Managed Care - PPO | Source: Ambulatory Visit | Attending: Obstetrics & Gynecology | Admitting: Obstetrics & Gynecology

## 2017-03-28 ENCOUNTER — Ambulatory Visit: Payer: Commercial Managed Care - PPO | Admitting: Obstetrics & Gynecology

## 2017-03-28 ENCOUNTER — Encounter: Payer: Self-pay | Admitting: Obstetrics & Gynecology

## 2017-03-28 VITALS — BP 102/66 | HR 74 | Resp 16 | Ht 64.0 in | Wt 136.0 lb

## 2017-03-28 DIAGNOSIS — Z0184 Encounter for antibody response examination: Secondary | ICD-10-CM | POA: Diagnosis not present

## 2017-03-28 DIAGNOSIS — N912 Amenorrhea, unspecified: Secondary | ICD-10-CM

## 2017-03-28 DIAGNOSIS — K5000 Crohn's disease of small intestine without complications: Secondary | ICD-10-CM

## 2017-03-28 DIAGNOSIS — Z01411 Encounter for gynecological examination (general) (routine) with abnormal findings: Secondary | ICD-10-CM

## 2017-03-28 DIAGNOSIS — Z124 Encounter for screening for malignant neoplasm of cervix: Secondary | ICD-10-CM | POA: Diagnosis not present

## 2017-03-28 LAB — POCT URINE PREGNANCY: PREG TEST UR: NEGATIVE

## 2017-03-28 NOTE — Progress Notes (Signed)
32 y.o. G0P0000 MarriedCaucasianF here for annual exam.  Having cycles about every 4-5 weeks.  Not keep track but does typically cycle every month.  Cycles last about five days.  Not "actively" trying.  Not taking PNV.  Advised to start.         Patient's last menstrual period was 02/24/2017.          Sexually active: Yes.    The current method of family planning is none.    Exercising: Yes.    walking  Smoker:  no  Health Maintenance: Pap:  08/18/15 Neg   08/20/12 Neg  History of abnormal Pap:  no  MMG:  04/20/12 [place] Bilateral BIRADS2:Benign. F/u age 69 Colonoscopy:  06/24/16 polyp. F/u 3 years  BMD:   n/a TDaP:  2015 Pneumonia vaccine(s):  2014 Gardasil: Completed  Hep C testing: n/a Screening Labs: PCP   reports that  has never smoked. she has never used smokeless tobacco. She reports that she drinks alcohol. She reports that she does not use drugs.  Past Medical History:  Diagnosis Date  . Anemia 11/2011   iron deficiency. 01/2013 parenteral iron. Intranasal B12.  Dr Marin Olp follows.   . B12 deficiency 11/2012  . Breast mass in female 04/21/2012  . Cervical cancer screening 01/27/2012  . Chicken pox as a child  . Crohn's disease ([place]) 2013   ileitis 10/2011. ileitis, mesenteric abscess, ? entero-enteric fistula on 08/2012 CT enterography  . Dermatitis 08/22/2016  . GERD (gastroesophageal reflux disease)   . Hepatitis B immune 07/2012   previous vaccination.   Mindi Slicker 09/2014   per Dermatologist.  Rx Zyrtec, Benadryl.   . Small bowel obstruction ([place])   . Vitamin D deficiency 2014    Past Surgical History:  Procedure Laterality Date  . WISDOM TOOTH EXTRACTION  32 yrs old    Current Outpatient Medications  Medication Sig Dispense Refill  . Adalimumab (HUMIRA PEN) 40 MG/0.4ML PNKT Inject 40 mg into the skin every 14 (fourteen) days. 2 each 11  . clobetasol ointment (TEMOVATE) 1.61 % Apply 1 application topically 2 (two) times daily. Apply as directed twice daily 60 g 1  .  Cyanocobalamin (NASCOBAL) 500 MCG/0.1ML SOLN 1 spray in 1 nostril once per week 1.3 mL 2  . doxycycline (VIBRA-TABS) 100 MG tablet Take 100 mg by mouth 2 (two) times daily.  0  . famotidine (PEPCID) 20 MG tablet Take 1 tablet (20 mg total) by mouth 2 (two) times daily. 60 tablet 3  . hyoscyamine (LEVSIN SL) 0.125 MG SL tablet Take 1-2 tablets by mouth every 6 hours as needed for spasm/pain 60 tablet 3  . Probiotic Product (MISC INTESTINAL FLORA REGULAT) CHEW Chew by mouth every other day. Digestive Health probiotic gummies by Schiff    . SYRINGE-NEEDLE, DISP, 3 ML 25G X 5/8" 3 ML MISC Use as directed for B12 injections. 50 each 0  . triamcinolone ointment (KENALOG) 0.1 % daily as needed.  0   No current facility-administered medications for this visit.     Family History  Problem Relation Age of Onset  . Nephrolithiasis Father   . Lung cancer Maternal Grandmother   . Gout Maternal Grandfather   . Heart attack Maternal Grandfather   . Ovarian cancer Paternal Grandmother   . Cancer Paternal Grandmother 50       ovarian  . Pancreatic cancer Paternal Grandfather   . Ovarian cancer Paternal Aunt        ovarian  . Breast cancer  Paternal Aunt   . Other Paternal Uncle 56       cyst removed from pancreas  . Pancreatic cancer Paternal Uncle        Stage 1    ROS:  Pertinent items are noted in HPI.  Otherwise, a comprehensive ROS was negative.  Exam:   BP 102/66 (BP Location: Right Arm, Patient Position: Sitting, Cuff Size: Normal)   Pulse 74   Resp 16   Ht 5' 4"  (1.626 m)   Wt 136 lb (61.7 kg)   LMP 02/24/2017   BMI 23.34 kg/m   Weight change: -3#  Height: 5' 4"  (162.6 cm)  Ht Readings from Last 3 Encounters:  03/28/17 5' 4"  (1.626 m)  08/22/16 5' 4"  (1.626 m)  08/18/16 5' 3.5" (1.613 m)    General appearance: alert, cooperative and appears stated age Head: Normocephalic, without obvious abnormality, atraumatic Neck: no adenopathy, supple, symmetrical, trachea midline and  thyroid normal to inspection and palpation Lungs: clear to auscultation bilaterally Breasts: normal appearance, no masses or tenderness Heart: regular rate and rhythm Abdomen: soft, non-tender; bowel sounds normal; no masses,  no organomegaly Extremities: extremities normal, atraumatic, no cyanosis or edema Skin: Skin color, texture, turgor normal. No rashes or lesions Lymph nodes: Cervical, supraclavicular, and axillary nodes normal. No abnormal inguinal nodes palpated Neurologic: Grossly normal   Pelvic: External genitalia:  no lesions              Urethra:  normal appearing urethra with no masses, tenderness or lesions              Bartholins and Skenes: normal                 Vagina: normal appearing vagina with normal color and discharge, no lesions              Cervix: no lesions              Pap taken: Yes.   Bimanual Exam:  Uterus:  normal size, contour, position, consistency, mobility, non-tender              Adnexa: normal adnexa and no mass, fullness, tenderness               Rectovaginal: Confirms               Anus:  normal sphincter tone, no lesions  Chaperone was present for exam.  A:  Well Woman with normal exam H/O chron's disease Off OCPs, contemplating pregnancy H/O ovarian cancer in PGM and paternal aunt age 23 at diagnosis.  Paternal aunt with positive genetic testing.  Father has not been tested.  We have discussed doing this before 40 for this pt is her father ultimately does not do the testing. H/o iron deficiency, followed by hematology  P:   Mammogram guidelines reveiwed pap smear obtained today MFM referral place for preconceptual counseling regarding inflammatory bowel disease Pt advised to start PNV Rubella status obtained today Return annually or prn

## 2017-03-29 LAB — CYTOLOGY - PAP: Diagnosis: NEGATIVE

## 2017-03-29 LAB — RUBELLA SCREEN: Rubella Antibodies, IGG: 2.02 index (ref >0.99)

## 2017-04-11 ENCOUNTER — Ambulatory Visit (HOSPITAL_COMMUNITY): Payer: Commercial Managed Care - PPO

## 2017-05-12 ENCOUNTER — Inpatient Hospital Stay: Payer: Commercial Managed Care - PPO | Attending: Family | Admitting: Hematology & Oncology

## 2017-05-12 ENCOUNTER — Ambulatory Visit (HOSPITAL_COMMUNITY)
Admission: RE | Admit: 2017-05-12 | Discharge: 2017-05-12 | Disposition: A | Discharge status: Home / Self Care | Payer: Commercial Managed Care - PPO | Source: Ambulatory Visit | Attending: Obstetrics & Gynecology | Admitting: Obstetrics & Gynecology

## 2017-05-12 ENCOUNTER — Encounter (HOSPITAL_COMMUNITY): Payer: Self-pay

## 2017-05-12 ENCOUNTER — Encounter: Payer: Self-pay | Admitting: Hematology & Oncology

## 2017-05-12 ENCOUNTER — Inpatient Hospital Stay: Payer: Commercial Managed Care - PPO

## 2017-05-12 ENCOUNTER — Other Ambulatory Visit: Payer: Self-pay

## 2017-05-12 VITALS — BP 105/65 | HR 67 | Temp 98.4°F | Resp 16 | Wt 136.0 lb

## 2017-05-12 DIAGNOSIS — K50918 Crohn's disease, unspecified, with other complication: Secondary | ICD-10-CM | POA: Diagnosis not present

## 2017-05-12 DIAGNOSIS — Z0000 Encounter for general adult medical examination without abnormal findings: Secondary | ICD-10-CM

## 2017-05-12 DIAGNOSIS — D508 Other iron deficiency anemias: Secondary | ICD-10-CM

## 2017-05-12 DIAGNOSIS — K5090 Crohn's disease, unspecified, without complications: Secondary | ICD-10-CM | POA: Diagnosis present

## 2017-05-12 DIAGNOSIS — D500 Iron deficiency anemia secondary to blood loss (chronic): Secondary | ICD-10-CM | POA: Insufficient documentation

## 2017-05-12 DIAGNOSIS — D509 Iron deficiency anemia, unspecified: Secondary | ICD-10-CM | POA: Insufficient documentation

## 2017-05-12 DIAGNOSIS — K909 Intestinal malabsorption, unspecified: Secondary | ICD-10-CM | POA: Diagnosis not present

## 2017-05-12 LAB — CBC WITH DIFFERENTIAL (CANCER CENTER ONLY)
BASOS PCT: 0 %
Basophils Absolute: 0 10*3/uL (ref 0.0–0.1)
EOS ABS: 0.2 10*3/uL (ref 0.0–0.5)
Eosinophils Relative: 2 %
HEMATOCRIT: 41.5 % (ref 34.8–46.6)
HEMOGLOBIN: 13.6 g/dL (ref 11.6–15.9)
Lymphocytes Relative: 35 %
Lymphs Abs: 2.4 10*3/uL (ref 0.9–3.3)
MCH: 29.2 pg (ref 26.0–34.0)
MCHC: 32.8 g/dL (ref 32.0–36.0)
MCV: 89.1 fL (ref 81.0–101.0)
MONO ABS: 0.7 10*3/uL (ref 0.1–0.9)
MONOS PCT: 10 %
Neutro Abs: 3.6 10*3/uL (ref 1.5–6.5)
Neutrophils Relative %: 53 %
Platelet Count: 320 10*3/uL (ref 145–400)
RBC: 4.66 MIL/uL (ref 3.70–5.32)
RDW: 13 % (ref 11.1–15.7)
WBC Count: 6.9 10*3/uL (ref 3.9–10.0)

## 2017-05-12 LAB — IRON AND TIBC
IRON: 89 ug/dL (ref 41–142)
Saturation Ratios: 27 % (ref 21–57)
TIBC: 334 ug/dL (ref 236–444)
UIBC: 245 ug/dL

## 2017-05-12 LAB — RETICULOCYTES
RBC.: 4.61 MIL/uL (ref 3.70–5.45)
RETIC COUNT ABSOLUTE: 27.7 10*3/uL — AB (ref 33.7–90.7)
RETIC CT PCT: 0.6 % — AB (ref 0.7–2.1)

## 2017-05-12 LAB — TSH: TSH: 1.159 u[IU]/mL (ref 0.308–3.960)

## 2017-05-12 LAB — FERRITIN: Ferritin: 18 ng/mL (ref 9–269)

## 2017-05-12 NOTE — Consult Note (Signed)
MATERNAL FETAL MEDICINE PRECONCEPTION CONSULT  Patient Name: Carla Clark Medical Record Number:  [id number] Date of Birth: 25-Oct-1985 Requesting Physician Name:  Megan Salon, MD Date of Service: 05/12/2017  Chief Complaint Crohn's Disease  History of Present Illness Carla Clark is a 32 y.o. G0P0000 who I am seeing for a preconceptual consult at the request of Dr. Sabra Heck.  Carla Clark has a history of Crohn's disease with ileocecal valve stenosis and a prior episode of bowel obstruction in 2017.  This was treated with medical therapy and did not require surgery or bowel resection.  She is currently doing well on Humira and take hycosamine very rarely as needed for cramping and abdominal pain.  She also has a history of iron deficiency anemia (for which she see a Hematologist), as well as vitamin D, and vitamin B12 deficiencies.  Review of Systems Pertinent items are noted in HPI.  Patient History OB History  Gravida Para Term Preterm AB Living  0 0 0 0 0 0  SAB TAB Ectopic Multiple Live Births  0 0 0 0 0    Past Medical History:  Diagnosis Date  . Anemia 11/2011   iron deficiency. 01/2013 parenteral iron. Intranasal B12.  Dr Marin Olp follows.   . B12 deficiency 11/2012  . Breast mass in female 04/21/2012  . Cervical cancer screening 01/27/2012  . Chicken pox as a child  . Crohn's disease ([place]) 2013   ileitis 10/2011. ileitis, mesenteric abscess, ? entero-enteric fistula on 08/2012 CT enterography  . Dermatitis 08/22/2016  . GERD (gastroesophageal reflux disease)   . Hepatitis B immune 07/2012   previous vaccination.   Mindi Slicker 09/2014   per Dermatologist.  Rx Zyrtec, Benadryl.   . Small bowel obstruction ([place])   . Vitamin D deficiency 2014    Past Surgical History:  Procedure Laterality Date  . WISDOM TOOTH EXTRACTION  32 yrs old    Social History   Socioeconomic History  . Marital status: Married    Spouse name: Not on file  . Number of children: Not on file  . Years  of education: Not on file  . Highest education level: Not on file  Occupational History  . Occupation: Product manager: [employer]  . Financial resource strain: Not on file  . Food insecurity:    Worry: Not on file    Inability: Not on file  . Transportation needs:    Medical: Not on file    Non-medical: Not on file  Tobacco Use  . Smoking status: Never Smoker  . Smokeless tobacco: Never Used  . Tobacco comment: never used tobacco  Substance and Sexual Activity  . Alcohol use: Yes    Alcohol/week: 0.0 oz    Comment: occasionaly  . Drug use: No  . Sexual activity: Yes    Partners: Male    Birth control/protection: Abstinence  Lifestyle  . Physical activity:    Days per week: Not on file    Minutes per session: Not on file  . Stress: Not on file  Relationships  . Social connections:    Talks on phone: Not on file    Gets together: Not on file    Attends religious service: Not on file    Active member of club or organization: Not on file    Attends meetings of clubs or organizations: Not on file    Relationship status: Not on file  Other Topics Concern  . Not  on file  Social History Narrative  . Not on file    Family History  Problem Relation Age of Onset  . Nephrolithiasis Father   . Lung cancer Maternal Grandmother   . Gout Maternal Grandfather   . Heart attack Maternal Grandfather   . Ovarian cancer Paternal Grandmother   . Cancer Paternal Grandmother 82       ovarian  . Pancreatic cancer Paternal Grandfather   . Ovarian cancer Paternal Aunt        ovarian  . Breast cancer Paternal Aunt   . Other Paternal Uncle 74       cyst removed from pancreas  . Pancreatic cancer Paternal Uncle        Stage 1   In addition, the patient has no family history of mental retardation, birth defects, or genetic diseases.  Physical Examination There were no vitals filed for this visit. General appearance - alert, well appearing, and in no  distress Mental status - alert, oriented to person, place, and time  Assessment and Recommendations 1.  Crohn's disease.  Carla Clark appears to have mild to moderate Crohn's disease at this time.  It has been well managed on Humira, and as her Gastroenterologist is concerned about the disease flaring should she stop that medication I would recommend continuing it throughout pregnancy as it has a good safety record in pregnancy.  Given her history of vitamin/mineral deficiencies I would reassess her levels of vitamin D, vitamin B12, folate, and calcium now and replace as indicated.  Her iron and ferritin levels were recently checked by her Hematologist.  I would defer the need for replacement to them.  I discussed with Carla Clark the potential risks of active Crohn's disease in pregnancy including miscarriage, fetal growth restriction, preterm birth, and stillbirth.  The risk of these complications in Carla Clark's case is low given the good control her disease at present.  We also discussed that Crohn's disease, and other autoimmune diseases, typically improve in pregnancy, but are more likely to flare in the first 6 weeks after delivery.  Again the likelihood of that is low if her control remains good and she continues Humira throughout pregnancy.  Carla Clark will requires serial growth ultrasounds every 4 weeks after her anatomy scan which is typically performed at 18 weeks.  If growth restriction develops appropriate testing should be initiated, but she does not require any antenatal fetal surveillance otherwise.  I spent 30 minutes with Carla Clark today of which 50% was face-to-face counseling.  Thank you for referring Carla Clark to the [facility].  Please do not hesitate to contact us with questions.   Jolyn Lent, MD

## 2017-05-12 NOTE — Progress Notes (Signed)
Hematology and Oncology Follow Up Visit  DAWNE CASALI [id number] 1985-08-23 31 y.o. 05/12/2017   Principle Diagnosis:  Iron deficiency anemia secondary to chronic blood loss due to Crohn's disease  Current Therapy:   IV iron as indicated - last received in August 2017   Interim History:  Ms. Caven is here today for follow-up.  She is doing quite well.  She is on her spring break right now.  She has had into [place] with her mom to see a sister.  Her sister and husband will be moving to [place].  In the summer, she and her husband will be going to [place].  I suspect that this will be for her 75th birthday.  She also got a new dog.  This is a rescue dog.  I saw a picture of her.  She is very cute.  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  She now is at [employer] and [employer]  She has had no problems with her monthly cycles.  She is had no flareups of the Crohn's disease.  ECOG Performance Status: 0 - Asymptomatic  Medications:  Allergies as of 05/12/2017   No Known Allergies     Medication List        Accurate as of 05/12/17  9:59 AM. Always use your most recent med list.          Adalimumab 40 MG/0.4ML Pnkt Commonly known as:  HUMIRA PEN Inject 40 mg into the skin every 14 (fourteen) days.   clobetasol ointment 0.05 % Commonly known as:  TEMOVATE Apply 1 application topically 2 (two) times daily. Apply as directed twice daily   Cyanocobalamin 500 MCG/0.1ML Soln Commonly known as:  NASCOBAL 1 spray in 1 nostril once per week   doxycycline 100 MG tablet Commonly known as:  VIBRA-TABS Take 100 mg by mouth 2 (two) times daily.   famotidine 20 MG tablet Commonly known as:  PEPCID Take 1 tablet (20 mg total) by mouth 2 (two) times daily.   hyoscyamine 0.125 MG SL tablet Commonly known as:  LEVSIN SL Take 1-2 tablets by mouth every 6 hours as needed for spasm/pain   Misc Intestinal Flora Regulat Chew Chew by mouth every other day. Digestive Health probiotic  gummies by [employer]   SYRINGE-NEEDLE (DISP) 3 ML 25G X 5/8" 3 ML Misc Use as directed for B12 injections.   triamcinolone ointment 0.1 % Commonly known as:  KENALOG daily as needed.       Allergies: No Known Allergies  Past Medical History, Surgical history, Social history, and Family History were reviewed and updated.  Review of Systems: All other 10 point review of systems is negative.   Physical Exam:  weight is 136 lb (61.7 kg). Her oral temperature is 98.4 F (36.9 C). Her blood pressure is 105/65 and her pulse is 67. Her respiration is 16 and oxygen saturation is 100%.   Wt Readings from Last 3 Encounters:  05/12/17 136 lb (61.7 kg)  03/28/17 136 lb (61.7 kg)  01/11/17 139 lb 12 oz (63.4 kg)    Ocular: Sclerae unicteric, pupils equal, round and reactive to light Ear-nose-throat: Oropharynx clear, dentition fair Lymphatic: No cervical, supraclavicular or axillary adenopathy Lungs no rales or rhonchi, good excursion bilaterally Heart regular rate and rhythm, no murmur appreciated Abd soft, nontender, positive bowel sounds, no liver or spleen tip palpated on exam, no fluid wave  MSK no focal spinal tenderness, no joint edema Neuro: non-focal, well-oriented, appropriate affect Breasts: Deferred  Lab Results  Component Value Date   WBC 6.9 05/12/2017   HGB 12.0 01/11/2017   HCT 41.5 05/12/2017   MCV 89.1 05/12/2017   PLT 320 05/12/2017   Lab Results  Component Value Date   FERRITIN 25 01/11/2017   IRON 70 01/11/2017   TIBC 299 01/11/2017   UIBC 229 01/11/2017   IRONPCTSAT 23 01/11/2017   Lab Results  Component Value Date   RETICCTPCT 0.3 (L) 01/20/2015   RBC 4.66 05/12/2017   RETICCTABS 13.7 (L) 01/20/2015   No results found for: KPAFRELGTCHN, LAMBDASER, [facility] Lab Results  Component Value Date   IGA 187 03/11/2016   No results found for: [name], SPEI   Chemistry      Component Value  Date/Time   NA 135 08/23/2016 0859   K 4.0 08/23/2016 0859   CL 106 08/23/2016 0859   CO2 25 08/23/2016 0859   BUN 9 08/23/2016 0859   CREATININE 0.76 08/23/2016 0859   CREATININE 0.62 08/20/2012 1139      Component Value Date/Time   CALCIUM 9.5 08/23/2016 0859   ALKPHOS 49 08/23/2016 0859   AST 15 08/23/2016 0859   ALT 14 08/23/2016 0859   BILITOT 0.6 08/23/2016 0859      Impression and Plan: Ms. Rikard is a very pleasant 32 yo caucasian female with iron deficiency anemia secondary to chronic GI blood loss and malabsorption associated with Crohn's disease. She continues to do well and has no complaints at this time.   We will see what her iron studies show and bring her back in next week for an infusion if needed.  We will plan to see her back again in another 6 months for follow-up and lab.   She will contact our office with any questions or concerns. We can certainly see her sooner if need be.   Volanda Napoleon, MD 4/19/20199:59 AM

## 2017-05-12 NOTE — ED Notes (Signed)
Pt in for preconception consult with Dr. Nelda Severe. BP 115/65, pulse 72, weight 136.2 lb.

## 2017-05-15 ENCOUNTER — Encounter: Payer: Self-pay | Admitting: *Deleted

## 2017-08-24 ENCOUNTER — Ambulatory Visit (INDEPENDENT_AMBULATORY_CARE_PROVIDER_SITE_OTHER): Payer: Commercial Managed Care - PPO | Admitting: Family Medicine

## 2017-08-24 ENCOUNTER — Encounter: Payer: Self-pay | Admitting: Family Medicine

## 2017-08-24 VITALS — BP 108/68 | HR 69 | Temp 98.2°F | Resp 18 | Ht 64.0 in | Wt 137.4 lb

## 2017-08-24 DIAGNOSIS — Z0000 Encounter for general adult medical examination without abnormal findings: Secondary | ICD-10-CM

## 2017-08-24 DIAGNOSIS — N912 Amenorrhea, unspecified: Secondary | ICD-10-CM

## 2017-08-24 DIAGNOSIS — L309 Dermatitis, unspecified: Secondary | ICD-10-CM

## 2017-08-24 DIAGNOSIS — D508 Other iron deficiency anemias: Secondary | ICD-10-CM

## 2017-08-24 DIAGNOSIS — N926 Irregular menstruation, unspecified: Secondary | ICD-10-CM | POA: Diagnosis not present

## 2017-08-24 LAB — COMPREHENSIVE METABOLIC PANEL
ALK PHOS: 51 U/L (ref 39–117)
ALT: 13 U/L (ref 0–35)
AST: 13 U/L (ref 0–37)
Albumin: 4.2 g/dL (ref 3.5–5.2)
BILIRUBIN TOTAL: 0.5 mg/dL (ref 0.2–1.2)
BUN: 10 mg/dL (ref 6–23)
CO2: 28 meq/L (ref 19–32)
CREATININE: 0.74 mg/dL (ref 0.40–1.20)
Calcium: 9.7 mg/dL (ref 8.4–10.5)
Chloride: 105 mEq/L (ref 96–112)
GFR: 97.95 mL/min (ref >60.00)
GLUCOSE: 82 mg/dL (ref 70–99)
Potassium: 4 mEq/L (ref 3.5–5.1)
Sodium: 138 mEq/L (ref 135–145)
Total Protein: 7.1 g/dL (ref 6.0–8.3)

## 2017-08-24 LAB — LIPID PANEL
CHOLESTEROL: 139 mg/dL (ref 0–200)
HDL: 61.8 mg/dL (ref >39.00)
LDL CALC: 65 mg/dL (ref 0–99)
NonHDL: 77.41
TRIGLYCERIDES: 64 mg/dL (ref 0.0–149.0)
Total CHOL/HDL Ratio: 2
VLDL: 12.8 mg/dL (ref 0.0–40.0)

## 2017-08-24 LAB — URINALYSIS
BILIRUBIN URINE: NEGATIVE
Hgb urine dipstick: NEGATIVE
KETONES UR: NEGATIVE
LEUKOCYTES UA: NEGATIVE
Nitrite: NEGATIVE
SPECIFIC GRAVITY, URINE: 1.01 (ref 1.000–1.030)
Total Protein, Urine: NEGATIVE
URINE GLUCOSE: NEGATIVE
UROBILINOGEN UA: 0.2 (ref 0.0–1.0)
pH: 6.5 (ref 5.0–8.0)

## 2017-08-24 LAB — CBC
HCT: 38.9 % (ref 36.0–46.0)
Hemoglobin: 12.8 g/dL (ref 12.0–15.0)
MCHC: 33 g/dL (ref 30.0–36.0)
MCV: 87 fl (ref 78.0–100.0)
PLATELETS: 299 10*3/uL (ref 150.0–400.0)
RBC: 4.47 Mil/uL (ref 3.87–5.11)
RDW: 13.5 % (ref 11.5–15.5)
WBC: 7.9 10*3/uL (ref 4.0–10.5)

## 2017-08-24 NOTE — Patient Instructions (Addendum)
NOW company makes reliable supplements, can order at [email or web address] Vitamin B12 Sublingual tablets, 1000 mcg tabs under tongue daily Start a Prenatal vitamin daily Hydrate with at least 64 oz to 84 oz daily  [email or web address]   Preventive Care 18-39 Years, Female Preventive care refers to lifestyle choices and visits with your health care provider that can promote health and wellness. What does preventive care include?  A yearly physical exam. This is also called an annual well check.  Dental exams once or twice a year.  Routine eye exams. Ask your health care provider how often you should have your eyes checked.  Personal lifestyle choices, including: ? Daily care of your teeth and gums. ? Regular physical activity. ? Eating a healthy diet. ? Avoiding tobacco and drug use. ? Limiting alcohol use. ? Practicing safe sex. ? Taking vitamin and mineral supplements as recommended by your health care provider. What happens during an annual well check? The services and screenings done by your health care provider during your annual well check will depend on your age, overall health, lifestyle risk factors, and family history of disease. Counseling Your health care provider may ask you questions about your:  Alcohol use.  Tobacco use.  Drug use.  Emotional well-being.  Home and relationship well-being.  Sexual activity.  Eating habits.  Work and work Statistician.  Method of birth control.  Menstrual cycle.  Pregnancy history.  Screening You may have the following tests or measurements:  Height, weight, and BMI.  Diabetes screening. This is done by checking your blood sugar (glucose) after you have not eaten for a while (fasting).  Blood pressure.  Lipid and cholesterol levels. These may be checked every 5 years starting at age 6.  Skin check.  Hepatitis C blood test.  Hepatitis B blood test.  Sexually transmitted disease (STD)  testing.  BRCA-related cancer screening. This may be done if you have a family history of breast, ovarian, tubal, or peritoneal cancers.  Pelvic exam and Pap test. This may be done every 3 years starting at age 72. Starting at age 11, this may be done every 5 years if you have a Pap test in combination with an HPV test.  Discuss your test results, treatment options, and if necessary, the need for more tests with your health care provider. Vaccines Your health care provider may recommend certain vaccines, such as:  Influenza vaccine. This is recommended every year.  Tetanus, diphtheria, and acellular pertussis (Tdap, Td) vaccine. You may need a Td booster every 10 years.  Varicella vaccine. You may need this if you have not been vaccinated.  HPV vaccine. If you are 50 or younger, you may need three doses over 6 months.  Measles, mumps, and rubella (MMR) vaccine. You may need at least one dose of MMR. You may also need a second dose.  Pneumococcal 13-valent conjugate (PCV13) vaccine. You may need this if you have certain conditions and were not previously vaccinated.  Pneumococcal polysaccharide (PPSV23) vaccine. You may need one or two doses if you smoke cigarettes or if you have certain conditions.  Meningococcal vaccine. One dose is recommended if you are age 97-21 years and a first-year college student living in a residence hall, or if you have one of several medical conditions. You may also need additional booster doses.  Hepatitis A vaccine. You may need this if you have certain conditions or if you travel or work in places where you may be exposed to hepatitis A.  Hepatitis B  vaccine. You may need this if you have certain conditions or if you travel or work in places where you may be exposed to hepatitis B.  Haemophilus influenzae type b (Hib) vaccine. You may need this if you have certain risk factors.  Talk to your health care provider about which screenings and vaccines you  need and how often you need them. This information is not intended to replace advice given to you by your health care provider. Make sure you discuss any questions you have with your health care provider. Document Released: 03/08/2001 Document Revised: 09/30/2015 Document Reviewed: 11/11/2014 Elsevier Interactive Patient Education  [employer].

## 2017-08-24 NOTE — Assessment & Plan Note (Addendum)
Patient encouraged to maintain heart healthy diet, regular exercise, adequate sleep. Consider daily probiotics. Take medications as prescribed. Labs ordered and reviewed. Form completed for work

## 2017-08-24 NOTE — Assessment & Plan Note (Signed)
Is following with [facility] and has been diagnosed in her ears and corners of mouth, using Mupirocin with good results, will request records.   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

## 2017-08-25 ENCOUNTER — Other Ambulatory Visit (INDEPENDENT_AMBULATORY_CARE_PROVIDER_SITE_OTHER): Payer: Commercial Managed Care - PPO

## 2017-08-25 DIAGNOSIS — N926 Irregular menstruation, unspecified: Secondary | ICD-10-CM | POA: Diagnosis not present

## 2017-08-25 LAB — HCG, QUANTITATIVE, PREGNANCY
HCG, Total, QN: <2 m[IU]/mL
Quantitative HCG: 0.12 m[IU]/mL

## 2017-08-25 NOTE — Progress Notes (Signed)
Subjective:  I acted as a Education administrator for Dr. Charlett Blake. [name], [place]  Patient ID: Carla Clark, female    DOB: [birth year], 32 y.o.   MRN: [id number]  No chief complaint on file.   HPI  Patient is in today for an annual exam and she is doing well. No recent febrile illness or hospitalizations. Maintains a heart healthy diet and exercises regularly. She has not had a menstrual cycle and they are trying to conceive. She had a positive home pregnancy. No fatigue or nausea. Denies CP/palp/SOB/HA/congestion/fevers/GI or GU c/o. Taking meds as prescribed  Patient Care Team: Mosie Lukes, MD as PCP - General (Family Medicine)   Past Medical History:  Diagnosis Date  . Anemia 11/2011   iron deficiency. 01/2013 parenteral iron. Intranasal B12.  Dr Marin Olp follows.   . B12 deficiency 11/2012  . Breast mass in female 04/21/2012  . Cervical cancer screening 01/27/2012  . Chicken pox as a child  . Crohn's disease ([place]) 2013   ileitis 10/2011. ileitis, mesenteric abscess, ? entero-enteric fistula on 08/2012 CT enterography  . Dermatitis 08/22/2016  . GERD (gastroesophageal reflux disease)   . Hepatitis B immune 07/2012   previous vaccination.   Mindi Slicker 09/2014   per Dermatologist.  Rx Zyrtec, Benadryl.   . Small bowel obstruction ([place])   . Vitamin D deficiency 2014    Past Surgical History:  Procedure Laterality Date  . WISDOM TOOTH EXTRACTION  32 yrs old    Family History  Problem Relation Age of Onset  . Nephrolithiasis Father   . Lung cancer Maternal Grandmother   . Gout Maternal Grandfather   . Heart attack Maternal Grandfather   . Ovarian cancer Paternal Grandmother   . Cancer Paternal Grandmother 13       ovarian  . Pancreatic cancer Paternal Grandfather   . Ovarian cancer Paternal Aunt        ovarian  . Breast cancer Paternal Aunt   . Cancer Paternal Aunt        breast (2019) and ovarian cancer (2005)  . Other Paternal Uncle 58       cyst removed from pancreas  . Pancreatic  cancer Paternal Uncle        Stage 1  . Cancer Paternal Uncle        pancreatic    Social History   Socioeconomic History  . Marital status: Married    Spouse name: Not on file  . Number of children: Not on file  . Years of education: Not on file  . Highest education level: Not on file  Occupational History  . Occupation: Product manager: [employer]  . Financial resource strain: Not on file  . Food insecurity:    Worry: Not on file    Inability: Not on file  . Transportation needs:    Medical: Not on file    Non-medical: Not on file  Tobacco Use  . Smoking status: Never Smoker  . Smokeless tobacco: Never Used  . Tobacco comment: never used tobacco  Substance and Sexual Activity  . Alcohol use: Yes    Alcohol/week: 0.0 oz    Comment: occasionaly  . Drug use: No  . Sexual activity: Yes    Partners: Male    Birth control/protection: Abstinence  Lifestyle  . Physical activity:    Days per week: Not on file    Minutes per session: Not on file  . Stress: Not on file  Relationships  . Social connections:    Talks on phone: Not on file    Gets together: Not on file    Attends religious service: Not on file    Active member of club or organization: Not on file    Attends meetings of clubs or organizations: Not on file    Relationship status: Not on file  . Intimate partner violence:    Fear of current or ex partner: Not on file    Emotionally abused: Not on file    Physically abused: Not on file    Forced sexual activity: Not on file  Other Topics Concern  . Not on file  Social History Narrative  . Not on file    Outpatient Medications Prior to Visit  Medication Sig Dispense Refill  . Adalimumab (HUMIRA PEN) 40 MG/0.4ML PNKT Inject 40 mg into the skin every 14 (fourteen) days. 2 each 11  . clobetasol ointment (TEMOVATE) 7.86 % Apply 1 application topically 2 (two) times daily. Apply as directed twice daily 60 g 1  . Cyanocobalamin  (NASCOBAL) 500 MCG/0.1ML SOLN 1 spray in 1 nostril once per week 1.3 mL 2  . famotidine (PEPCID) 20 MG tablet Take 1 tablet (20 mg total) by mouth 2 (two) times daily. 60 tablet 3  . hyoscyamine (LEVSIN SL) 0.125 MG SL tablet Take 1-2 tablets by mouth every 6 hours as needed for spasm/pain 60 tablet 3  . Probiotic Product (MISC INTESTINAL FLORA REGULAT) CHEW Chew by mouth every other day. Digestive Health probiotic gummies by [employer]    . SYRINGE-NEEDLE, DISP, 3 ML 25G X 5/8" 3 ML MISC Use as directed for B12 injections. 50 each 0  . triamcinolone ointment (KENALOG) 0.1 % daily as needed.  0  . doxycycline (VIBRA-TABS) 100 MG tablet Take 100 mg by mouth 2 (two) times daily.  0   No facility-administered medications prior to visit.     No Known Allergies  Review of Systems  Constitutional: Negative for fever.  HENT: Negative for congestion.   Eyes: Negative for blurred vision.  Respiratory: Negative for cough.   Cardiovascular: Negative for chest pain and palpitations.  Gastrointestinal: Negative for vomiting.  Musculoskeletal: Negative for back pain.  Skin: Negative for rash.  Neurological: Negative for loss of consciousness and headaches.       Objective:    Physical Exam  Constitutional: She is oriented to person, place, and time. She appears well-developed and well-nourished. No distress.  HENT:  Head: Normocephalic and atraumatic.  Eyes: Conjunctivae are normal.  Neck: Neck supple. No thyromegaly present.  Cardiovascular: Normal rate, regular rhythm and normal heart sounds.  No murmur heard. Pulmonary/Chest: Effort normal and breath sounds normal. No respiratory distress.  Abdominal: Soft. Bowel sounds are normal. She exhibits no distension and no mass. There is no tenderness.  Musculoskeletal: She exhibits no edema.  Lymphadenopathy:    She has no cervical adenopathy.  Neurological: She is alert and oriented to person, place, and time.  Skin: Skin is warm and dry.    Psychiatric: She has a normal mood and affect. Her behavior is normal.    BP 108/68 (BP Location: Left Arm, Patient Position: Sitting, Cuff Size: Normal)   Pulse 69   Temp 98.2 F (36.8 C) (Oral)   Resp 18   Ht 5' 4"  (1.626 m)   Wt 137 lb 6.4 oz (62.3 kg)   LMP 07/12/2017   SpO2 98%   BMI 23.58 kg/m  Wt Readings from Last 3  Encounters:  08/24/17 137 lb 6.4 oz (62.3 kg)  05/12/17 136 lb (61.7 kg)  03/28/17 136 lb (61.7 kg)   BP Readings from Last 3 Encounters:  08/24/17 108/68  05/12/17 105/65  03/28/17 102/66     Immunization History  Administered Date(s) Administered  . Hepatitis B 10/21/1998, 10/25/1998, 04/21/1999  . Influenza,inj,Quad PF,6+ Mos 11/06/2013  . Influenza,inj,quad, With Preservative 12/03/2012  . Influenza-Unspecified 10/25/2015  . PPD Test 08/31/2012  . Pneumococcal Polysaccharide-23 12/03/2012  . Td 04/21/1999  . Tdap 01/20/2014    Health Maintenance  Topic Date Due  . HIV Screening  08/30/2002  . INFLUENZA VACCINE  08/24/2017  . COLONOSCOPY  06/25/2019  . PAP SMEAR  03/28/2020  . TETANUS/TDAP  01/21/2024    Lab Results  Component Value Date   WBC 7.9 08/24/2017   HGB 12.8 08/24/2017   HCT 38.9 08/24/2017   PLT 299.0 08/24/2017   GLUCOSE 82 08/24/2017   CHOL 139 08/24/2017   TRIG 64.0 08/24/2017   HDL 61.80 08/24/2017   LDLCALC 65 08/24/2017   ALT 13 08/24/2017   AST 13 08/24/2017   NA 138 08/24/2017   K 4.0 08/24/2017   CL 105 08/24/2017   CREATININE 0.74 08/24/2017   BUN 10 08/24/2017   CO2 28 08/24/2017   TSH 1.159 05/12/2017    Lab Results  Component Value Date   TSH 1.159 05/12/2017   Lab Results  Component Value Date   WBC 7.9 08/24/2017   HGB 12.8 08/24/2017   HCT 38.9 08/24/2017   MCV 87.0 08/24/2017   PLT 299.0 08/24/2017   Lab Results  Component Value Date   NA 138 08/24/2017   K 4.0 08/24/2017   CO2 28 08/24/2017   GLUCOSE 82 08/24/2017   BUN 10 08/24/2017   CREATININE 0.74 08/24/2017   BILITOT 0.5  08/24/2017   ALKPHOS 51 08/24/2017   AST 13 08/24/2017   ALT 13 08/24/2017   PROT 7.1 08/24/2017   ALBUMIN 4.2 08/24/2017   CALCIUM 9.7 08/24/2017   ANIONGAP 7 04/18/2015   GFR 97.95 08/24/2017   Lab Results  Component Value Date   CHOL 139 08/24/2017   Lab Results  Component Value Date   HDL 61.80 08/24/2017   Lab Results  Component Value Date   LDLCALC 65 08/24/2017   Lab Results  Component Value Date   TRIG 64.0 08/24/2017   Lab Results  Component Value Date   CHOLHDL 2 08/24/2017   No results found for: HGBA1C       Assessment & Plan:   Problem List Items Addressed This Visit    Other iron deficiency anemia (Chronic)   Relevant Orders   CBC (Completed)   Preventative health care    Patient encouraged to maintain heart healthy diet, regular exercise, adequate sleep. Consider daily probiotics. Take medications as prescribed. Labs ordered and reviewed. Form completed for work      Relevant Orders   Comprehensive metabolic panel (Completed)   CBC (Completed)   Urinalysis (Completed)   Lipid panel (Completed)   Eczema    Is following with [facility] and has been diagnosed in her ears and corners of mouth, using Mupirocin with good results, will request records.   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 She is encouraged to stat a Prenatal vitamin daily and  avoid OTC meds. hydate well and eat heart healthy diet. She will contact OB/GYN or [place]  if she has any concerns.        Other Visit Diagnoses    Missed period    -  Primary   Relevant Orders   Other/Misc lab test   B-HCG Quant (Completed)   POCT urine pregnancy      I have discontinued Tanveer B. Lebeau's doxycycline. I am also having her maintain her Misc Intestinal Flora Regulat, famotidine, hyoscyamine, SYRINGE-NEEDLE (DISP) 3 ML, Cyanocobalamin, clobetasol  ointment, Adalimumab, and triamcinolone ointment.  No orders of the defined types were placed in this encounter.   CMA served as Education administrator during this visit. History, Physical and Plan performed by medical provider. Documentation and orders reviewed and attested to.  Penni Homans, MD

## 2017-08-27 NOTE — Assessment & Plan Note (Signed)
Had a positive pregnancy test at home and she has not had a cycle since 07/12/17 but he testing here is negative. She is encouraged to stat a Prenatal vitamin daily and avoid OTC meds. hydate well and eat heart healthy diet. She will contact OB/GYN or [place]  if she has any concerns.

## 2017-11-09 ENCOUNTER — Other Ambulatory Visit: Payer: Self-pay

## 2017-11-09 ENCOUNTER — Telehealth: Payer: Self-pay | Admitting: Hematology & Oncology

## 2017-11-09 ENCOUNTER — Telehealth: Payer: Self-pay | Admitting: Internal Medicine

## 2017-11-09 MED ORDER — ADALIMUMAB 40 MG/0.4ML ~~LOC~~ AJKT: 40.0000 mg | AUTO-INJECTOR | SUBCUTANEOUS | 11 refills | Status: DC

## 2017-11-09 NOTE — Telephone Encounter (Signed)
Received message from patient in regards to resch appt 10/18. Asked pt to return call to office to pick a new date/time

## 2017-11-09 NOTE — Telephone Encounter (Signed)
Pt needs rf for humira. She said that [facility] told her that there was a hold on the prescription and to call her doctor.

## 2017-11-10 ENCOUNTER — Ambulatory Visit: Payer: Commercial Managed Care - PPO | Admitting: Hematology

## 2017-11-10 ENCOUNTER — Other Ambulatory Visit: Payer: Commercial Managed Care - PPO

## 2017-11-14 ENCOUNTER — Telehealth: Payer: Self-pay | Admitting: Internal Medicine

## 2017-11-14 NOTE — Telephone Encounter (Signed)
[facility] calling regarding PA for humira, they will take it verbal at [phone]. Ref # [id number].

## 2017-11-14 NOTE — Telephone Encounter (Signed)
Called [facility] and they do not have pt in system.

## 2017-11-15 ENCOUNTER — Telehealth: Payer: Self-pay | Admitting: Internal Medicine

## 2017-11-15 NOTE — Telephone Encounter (Signed)
Attempted to call pt and received message voicemail box is full and cannot accept messages. Script has been sent to [facility] and PA has been approved so pt should be able to get humira filled.

## 2017-11-15 NOTE — Telephone Encounter (Signed)
Spoke with pt and she is aware.

## 2017-12-26 ENCOUNTER — Other Ambulatory Visit: Payer: Self-pay

## 2017-12-26 ENCOUNTER — Ambulatory Visit: Payer: Commercial Managed Care - PPO | Admitting: Obstetrics & Gynecology

## 2017-12-26 ENCOUNTER — Encounter: Payer: Self-pay | Admitting: Obstetrics & Gynecology

## 2017-12-26 VITALS — BP 110/70 | HR 88 | Resp 16 | Ht 64.0 in | Wt 137.0 lb

## 2017-12-26 DIAGNOSIS — N912 Amenorrhea, unspecified: Secondary | ICD-10-CM

## 2017-12-26 LAB — POCT URINE PREGNANCY: PREG TEST UR: POSITIVE — AB

## 2017-12-26 NOTE — Progress Notes (Signed)
GYNECOLOGY  VISIT  CC:  Positive home pregnancy test  HPI: 32 y.o. G0P0000 Married White or Caucasian female here for amenorrhea.  She's taken two home pregnancy but the first one was was negative on 12/14/17.  Then she had a positive test on 12/23/17 and then a second one was positive as well.  UPT is positive here today.  Based on LMP, [facility] 08/16/17.  EGA 6 4/7.  She's not having any nausea.  She is having increased constipation.  She is not having increased breast tenderness.    She did get a flu shot this year.  Rubella status is immune.  No cats in the home.  Tdap 01/20/14.  Had chicken pox.  Fish/shellfish/mercury discuss.  Patient does not eat fish.  Unpasteurized cheese/juices discussed.  Nitrites in foods disucssed.  Exercise and intercourse discussed.  Fetal DNA particle testing discussed.  First trimester down's testing discussed.  Cystic fibrosis discussed.   Taking PNV.  GYNECOLOGIC HISTORY: Patient's last menstrual period was 11/10/2017. Contraception: none Menopausal hormone therapy: none  Patient Active Problem List   Diagnosis Date Noted  . Amenorrhea 08/24/2016  . Dermatitis 08/22/2016  . B12 deficiency 08/18/2016  . History of colon polyps 08/18/2016  . History of small bowel obstruction 11/20/2015  . Eczema 11/05/2013  . Gastroesophageal reflux disease without esophagitis 09/10/2013  . Other iron deficiency anemia 01/21/2013  . Crohn's disease ([place]) 08/07/2012  . Breast mass in female 04/21/2012  . Cervical cancer screening 01/27/2012  . Preventative health care 11/06/2011    Past Medical History:  Diagnosis Date  . Anemia 11/2011   iron deficiency. 01/2013 parenteral iron. Intranasal B12.  Dr Marin Olp follows.   . B12 deficiency 11/2012  . Breast mass in female 04/21/2012  . Cervical cancer screening 01/27/2012  . Chicken pox as a child  . Crohn's disease ([place]) 2013   ileitis 10/2011. ileitis, mesenteric abscess, ? entero-enteric fistula on 08/2012 CT  enterography  . Dermatitis 08/22/2016  . GERD (gastroesophageal reflux disease)   . Hepatitis B immune 07/2012   previous vaccination.   Mindi Slicker 09/2014   per Dermatologist.  Rx Zyrtec, Benadryl.   . Small bowel obstruction ([place])   . Vitamin D deficiency 2014    Past Surgical History:  Procedure Laterality Date  . WISDOM TOOTH EXTRACTION  32 yrs old    MEDS:   Current Outpatient Medications on File Prior to Visit  Medication Sig Dispense Refill  . Adalimumab (HUMIRA PEN) 40 MG/0.4ML PNKT Inject 40 mg into the skin every 14 (fourteen) days. 2 each 11  . Probiotic Product (MISC INTESTINAL FLORA REGULAT) CHEW Chew by mouth every other day. Digestive Health probiotic gummies by [employer]    . SYRINGE-NEEDLE, DISP, 3 ML 25G X 5/8" 3 ML MISC Use as directed for B12 injections. 50 each 0  . triamcinolone ointment (KENALOG) 0.1 % daily as needed.  0  . vitamin B-12 (CYANOCOBALAMIN) 100 MCG tablet Take 100 mcg by mouth daily.     No current facility-administered medications on file prior to visit.     ALLERGIES: Patient has no known allergies.  Family History  Problem Relation Age of Onset  . Nephrolithiasis Father   . Lung cancer Maternal Grandmother   . Gout Maternal Grandfather   . Heart attack Maternal Grandfather   . Ovarian cancer Paternal Grandmother   . Cancer Paternal Grandmother 3       ovarian  . Pancreatic cancer Paternal Grandfather   . Ovarian cancer Paternal  Aunt        ovarian  . Breast cancer Paternal Aunt   . Cancer Paternal Aunt        breast (2019) and ovarian cancer (2005)  . Other Paternal Uncle 44       cyst removed from pancreas  . Pancreatic cancer Paternal Uncle        Stage 1  . Cancer Paternal Uncle        pancreatic    SH:  Married, non smoker  Review of Systems  Genitourinary:       Menstrual cycle changes   Neurological: Positive for headaches.  All other systems reviewed and are negative.   PHYSICAL EXAMINATION:    BP 110/70 (BP  Location: Right Arm, Patient Position: Sitting, Cuff Size: Normal)   Pulse 88   Resp 16   Ht 5' 4"  (1.626 m)   Wt 137 lb (62.1 kg)   LMP 11/10/2017   BMI 23.52 kg/m     General appearance: alert, cooperative and appears stated age  Assessment: Positive pregnancy test H/O Crohn's disease  Plan: HCG obtained today Consider viability scan depending on level Information regarding ob practices discussed.   ~15 minutes spent with patient >50% of time was in face to face discussion of above.

## 2017-12-27 ENCOUNTER — Telehealth: Payer: Self-pay | Admitting: *Deleted

## 2017-12-27 DIAGNOSIS — Z3201 Encounter for pregnancy test, result positive: Secondary | ICD-10-CM

## 2017-12-27 DIAGNOSIS — N912 Amenorrhea, unspecified: Secondary | ICD-10-CM

## 2017-12-27 LAB — BETA HCG QUANT (REF LAB): hCG Quant: 2996 m[IU]/mL

## 2017-12-27 NOTE — Telephone Encounter (Signed)
-----   Message from Megan Salon, MD sent at 12/27/2017 12:16 PM EST ----- Please let pt know her HCG is about 3000.  I think she just needs to repeat the HCG tomorrow and plan for ultrasound next Thursday.  Please schedule both.  Thanks.

## 2017-12-27 NOTE — Telephone Encounter (Signed)
Encounter closed in error.  Spoke with patient, advised as seen below per Dr. Sabra Heck. Lab appt scheduled for 12/5 at 3:40pm, patient declined earlier time due to her work schedule. PUS scheduled for 01/04/18 at 12:30pm, consult at 1pm with Dr. Sabra Heck. Patient verbalizes understanding and is agreeable.   Order placed for PUS for precert.   Routing to [employer].   Encounter closed.

## 2017-12-27 NOTE — Telephone Encounter (Signed)
Notes recorded by Burnice Logan, RN on 12/27/2017 at 3:23 PM EST Left message to call Sharee Pimple, RN at [name].

## 2017-12-27 NOTE — Telephone Encounter (Signed)
Patient is returning call to [name]. °

## 2017-12-27 NOTE — Addendum Note (Signed)
Addended by: Burnice Logan on: 12/27/2017 04:39 PM   Modules accepted: Orders

## 2017-12-28 ENCOUNTER — Other Ambulatory Visit: Payer: Self-pay | Admitting: *Deleted

## 2017-12-28 ENCOUNTER — Other Ambulatory Visit (INDEPENDENT_AMBULATORY_CARE_PROVIDER_SITE_OTHER): Payer: Commercial Managed Care - PPO

## 2017-12-28 DIAGNOSIS — Z3201 Encounter for pregnancy test, result positive: Secondary | ICD-10-CM

## 2017-12-28 DIAGNOSIS — N912 Amenorrhea, unspecified: Secondary | ICD-10-CM

## 2017-12-29 LAB — BETA HCG QUANT (REF LAB): HCG QUANT: 5985 m[IU]/mL

## 2018-01-04 ENCOUNTER — Encounter: Payer: Self-pay | Admitting: Obstetrics & Gynecology

## 2018-01-04 ENCOUNTER — Ambulatory Visit: Payer: Commercial Managed Care - PPO | Admitting: Obstetrics & Gynecology

## 2018-01-04 ENCOUNTER — Ambulatory Visit (INDEPENDENT_AMBULATORY_CARE_PROVIDER_SITE_OTHER): Payer: Commercial Managed Care - PPO

## 2018-01-04 VITALS — BP 112/60 | HR 88 | Resp 16 | Ht 64.0 in | Wt 131.0 lb

## 2018-01-04 DIAGNOSIS — N912 Amenorrhea, unspecified: Secondary | ICD-10-CM

## 2018-01-04 DIAGNOSIS — O26841 Uterine size-date discrepancy, first trimester: Secondary | ICD-10-CM | POA: Diagnosis not present

## 2018-01-04 DIAGNOSIS — Z3201 Encounter for pregnancy test, result positive: Secondary | ICD-10-CM | POA: Diagnosis not present

## 2018-01-04 NOTE — Progress Notes (Signed)
32 y.o. G88P0000 Married White or Caucasian female here for pelvic ultrasound for viability.  LMP 11/10/17 and EGA is 08/17/18.  Ultrasound today showed singleton IUD with EGA 6 0/7 weeks with EGA 08/30/18.  Having a lot of fatigue.  She had a little spotting last night.  This was dark and brown in appearance.  Having some nausea in the morning as well.  Patient's last menstrual period was 11/10/2017.   Findings:  UTERUS: singleton IUD with CRL of 0.37.  FCA noted at 112 BPM.   ADNEXA: Left ovary: 1.9 x 1.3cm       Right ovary: 4.1 x 2.0cm with 2.1 x 2.0cm CUL DE SAC: no free fluid  Discussion:  Findings discussed with pt.  Dates changed with EDC of 08/30/18.    No cats in the home.  Tdap 01/20/14.  Had chicken pox.  Aware flu vaccine safe and recommended.  Received it on 11/02/2017.    Fish/shellfish/mercury discuss.  Patient does not eat fish.  Unpasteurized cheese/juices discussed.  Nitrites in foods disucssed.  Exercise and intercourse discussed.  Fetal DNA particle testing discussed.  First trimester down's testing discussed.  Cystic fibrosis discussed..  Assessment:  Amenorrhea Size < dates, dating changed today based on ultrasound  Plan:  Pt is going to transfer care at this point.  She is planning to transfer care.  She will call and let me know where to send records.  ~20 minutes spent with patient >50% of time was in face to face discussion of above.

## 2018-01-24 NOTE — L&D Delivery Note (Signed)
Delivery Note Pt pushed well for about an hour. At 11:39 PM a viable female was delivered via Vaginal, Spontaneous (Presentation:ROA; compound right arm ;  ).  APGAR: 9,9, ; weight pending.   Placenta status: delivered; intact , .  Cord:2vc  with the following complications:nuchal x 2 .  Cord pH: n/a  Anesthesia:  Epidural Episiotomy: None Lacerations: 2nd degree Suture Repair: 3.0 vicryl Est. Blood Loss (mL): 355  Mom to postpartum.  Baby to Couplet care / Skin to Skin  Desires circumcision in hospital.  [place] 08/13/2018, 12:09 AM

## 2018-06-26 ENCOUNTER — Ambulatory Visit: Payer: Commercial Managed Care - PPO | Admitting: Obstetrics & Gynecology

## 2018-07-10 ENCOUNTER — Encounter: Payer: Self-pay | Admitting: Family Medicine

## 2018-08-12 ENCOUNTER — Encounter (HOSPITAL_COMMUNITY): Payer: Self-pay

## 2018-08-12 ENCOUNTER — Inpatient Hospital Stay (HOSPITAL_COMMUNITY): Payer: Commercial Managed Care - PPO | Admitting: Anesthesiology

## 2018-08-12 ENCOUNTER — Inpatient Hospital Stay (HOSPITAL_COMMUNITY)
Admission: AD | Admit: 2018-08-12 | Discharge: 2018-08-14 | DRG: 806 | Disposition: A | Discharge status: Home / Self Care | Payer: Commercial Managed Care - PPO | Attending: Obstetrics and Gynecology | Admitting: Obstetrics and Gynecology

## 2018-08-12 ENCOUNTER — Other Ambulatory Visit: Payer: Self-pay

## 2018-08-12 DIAGNOSIS — Z3689 Encounter for other specified antenatal screening: Secondary | ICD-10-CM

## 2018-08-12 DIAGNOSIS — K5090 Crohn's disease, unspecified, without complications: Secondary | ICD-10-CM | POA: Diagnosis present

## 2018-08-12 DIAGNOSIS — O26893 Other specified pregnancy related conditions, third trimester: Secondary | ICD-10-CM | POA: Diagnosis present

## 2018-08-12 DIAGNOSIS — O9962 Diseases of the digestive system complicating childbirth: Secondary | ICD-10-CM | POA: Diagnosis present

## 2018-08-12 DIAGNOSIS — Z3A37 37 weeks gestation of pregnancy: Secondary | ICD-10-CM | POA: Diagnosis not present

## 2018-08-12 DIAGNOSIS — O99824 Streptococcus B carrier state complicating childbirth: Secondary | ICD-10-CM | POA: Diagnosis present

## 2018-08-12 DIAGNOSIS — Z1159 Encounter for screening for other viral diseases: Secondary | ICD-10-CM | POA: Diagnosis not present

## 2018-08-12 LAB — CBC
HCT: 36.1 % (ref 36.0–46.0)
Hemoglobin: 11.6 g/dL — ABNORMAL LOW (ref 12.0–15.0)
MCH: 26.7 pg (ref 26.0–34.0)
MCHC: 32.1 g/dL (ref 30.0–36.0)
MCV: 83.2 fL (ref 80.0–100.0)
Platelets: 281 10*3/uL (ref 150–400)
RBC: 4.34 MIL/uL (ref 3.87–5.11)
RDW: 13.6 % (ref 11.5–15.5)
WBC: 9.8 10*3/uL (ref 4.0–10.5)
nRBC: 0 % (ref 0.0–0.2)

## 2018-08-12 LAB — TYPE AND SCREEN
ABO/RH(D): B POS
Antibody Screen: NEGATIVE

## 2018-08-12 LAB — SARS CORONAVIRUS 2 BY RT PCR (HOSPITAL ORDER, PERFORMED IN ~~LOC~~ HOSPITAL LAB): SARS Coronavirus 2: NEGATIVE

## 2018-08-12 LAB — ABO/RH: ABO/RH(D): B POS

## 2018-08-12 MED ORDER — OXYTOCIN BOLUS FROM INFUSION
500.0000 mL | Freq: Once | INTRAVENOUS | Status: AC
  Administered 2018-08-12: 500 mL via INTRAVENOUS

## 2018-08-12 MED ORDER — LIDOCAINE HCL (PF) 1 % IJ SOLN: 30.0000 mL | INTRAMUSCULAR | Status: DC | PRN

## 2018-08-12 MED ORDER — PENICILLIN G 3 MILLION UNITS IVPB - SIMPLE MED
3.0000 10*6.[IU] | INTRAVENOUS | Status: DC
  Administered 2018-08-12: 3 10*6.[IU] via INTRAVENOUS
  Filled 2018-08-12: qty 100

## 2018-08-12 MED ORDER — FENTANYL-BUPIVACAINE-NACL 0.5-0.125-0.9 MG/250ML-% EP SOLN
12.0000 mL/h | EPIDURAL | Status: DC | PRN
  Filled 2018-08-12: qty 250

## 2018-08-12 MED ORDER — LACTATED RINGERS IV SOLN: 500.0000 mL | INTRAVENOUS | Status: DC | PRN

## 2018-08-12 MED ORDER — SOD CITRATE-CITRIC ACID 500-334 MG/5ML PO SOLN: 30.0000 mL | ORAL | Status: DC | PRN

## 2018-08-12 MED ORDER — ONDANSETRON HCL 4 MG/2ML IJ SOLN: 4.0000 mg | Freq: Four times a day (QID) | INTRAMUSCULAR | Status: DC | PRN

## 2018-08-12 MED ORDER — DIPHENHYDRAMINE HCL 50 MG/ML IJ SOLN: 12.5000 mg | INTRAMUSCULAR | Status: DC | PRN

## 2018-08-12 MED ORDER — LACTATED RINGERS IV SOLN
INTRAVENOUS | Status: DC
  Administered 2018-08-12: 125 mL/h via INTRAVENOUS
  Administered 2018-08-12: 21:00:00 via INTRAVENOUS

## 2018-08-12 MED ORDER — EPHEDRINE 5 MG/ML INJ: 10.0000 mg | INTRAVENOUS | Status: DC | PRN

## 2018-08-12 MED ORDER — SODIUM CHLORIDE 0.9 % IV SOLN
5.0000 10*6.[IU] | Freq: Once | INTRAVENOUS | Status: AC
  Administered 2018-08-12: 5 10*6.[IU] via INTRAVENOUS
  Filled 2018-08-12: qty 5

## 2018-08-12 MED ORDER — SODIUM CHLORIDE 0.9 % IV SOLN: 2.0000 g | Freq: Four times a day (QID) | INTRAVENOUS | Status: DC

## 2018-08-12 MED ORDER — PHENYLEPHRINE 40 MCG/ML (10ML) SYRINGE FOR IV PUSH (FOR BLOOD PRESSURE SUPPORT): 80.0000 ug | PREFILLED_SYRINGE | INTRAVENOUS | Status: DC | PRN

## 2018-08-12 MED ORDER — ACETAMINOPHEN 325 MG PO TABS: 650.0000 mg | ORAL_TABLET | ORAL | Status: DC | PRN

## 2018-08-12 MED ORDER — LIDOCAINE HCL (PF) 1 % IJ SOLN
INTRAMUSCULAR | Status: DC | PRN
  Administered 2018-08-12: 5 mL via EPIDURAL

## 2018-08-12 MED ORDER — OXYTOCIN 40 UNITS IN NORMAL SALINE INFUSION - SIMPLE MED
2.5000 [IU]/h | INTRAVENOUS | Status: DC
  Filled 2018-08-12: qty 1000

## 2018-08-12 MED ORDER — OXYCODONE-ACETAMINOPHEN 5-325 MG PO TABS: 1.0000 | ORAL_TABLET | ORAL | Status: DC | PRN

## 2018-08-12 MED ORDER — LACTATED RINGERS IV SOLN
500.0000 mL | Freq: Once | INTRAVENOUS | Status: AC
  Administered 2018-08-12: 500 mL via INTRAVENOUS

## 2018-08-12 MED ORDER — SODIUM CHLORIDE (PF) 0.9 % IJ SOLN
INTRAMUSCULAR | Status: DC | PRN
  Administered 2018-08-12: 12 mL/h via EPIDURAL

## 2018-08-12 MED ORDER — FLEET ENEMA 7-19 GM/118ML RE ENEM: 1.0000 | ENEMA | RECTAL | Status: DC | PRN

## 2018-08-12 MED ORDER — BUTORPHANOL TARTRATE 1 MG/ML IJ SOLN
1.0000 mg | Freq: Once | INTRAMUSCULAR | Status: AC
  Administered 2018-08-12: 1 mg via INTRAVENOUS
  Filled 2018-08-12: qty 1

## 2018-08-12 MED ORDER — OXYCODONE-ACETAMINOPHEN 5-325 MG PO TABS: 2.0000 | ORAL_TABLET | ORAL | Status: DC | PRN

## 2018-08-12 NOTE — Anesthesia Procedure Notes (Signed)
Epidural Patient location during procedure: OB Start time: 08/12/2018 7:39 PM End time: 08/12/2018 7:52 PM  Staffing Anesthesiologist: Barnet Glasgow, MD Performed: anesthesiologist   Preanesthetic Checklist Completed: patient identified, site marked, surgical consent, pre-op evaluation, timeout performed, IV checked, risks and benefits discussed and monitors and equipment checked  Epidural Patient position: sitting Prep: site prepped and draped and DuraPrep Patient monitoring: continuous pulse ox and blood pressure Approach: midline Location: L3-L4 Injection technique: LOR air  Needle:  Needle type: Tuohy  Needle gauge: 17 G Needle length: 9 cm and 9 Needle insertion depth: 6 cm Catheter type: closed end flexible Catheter size: 19 Gauge Catheter at skin depth: 12 cm Test dose: negative  Assessment Events: blood not aspirated, injection not painful, no injection resistance, negative IV test and no paresthesia  Additional Notes Patient identified. Risks/Benefits/Options discussed with patient including but not limited to bleeding, infection, nerve damage, paralysis, failed block, incomplete pain control, headache, blood pressure changes, nausea, vomiting, reactions to medication both or allergic, itching and postpartum back pain. Confirmed with bedside nurse the patient's most recent platelet count. Confirmed with patient that they are not currently taking any anticoagulation, have any bleeding history or any family history of bleeding disorders. Patient expressed understanding and wished to proceed. All questions were answered. Sterile technique was used throughout the entire procedure. Please see nursing notes for vital signs. Test dose was given through epidural needle and negative prior to continuing to dose epidural or start infusion. Warning signs of high block given to the patient including shortness of breath, tingling/numbness in hands, complete motor block, or any  concerning symptoms with instructions to call for help. Patient was given instructions on fall risk and not to get out of bed. All questions and concerns addressed with instructions to call with any issues. 1Attempt (S) . Patient tolerated procedure well.

## 2018-08-12 NOTE — Anesthesia Preprocedure Evaluation (Signed)
Anesthesia Evaluation  Patient identified by MRN, date of birth, ID band Patient awake    Reviewed: Allergy & Precautions, NPO status , Patient's Chart, lab work & pertinent test results  Airway Mallampati: II  TM Distance: >3 FB Neck ROM: Full    Dental no notable dental hx.    Pulmonary neg pulmonary ROS,    Pulmonary exam normal breath sounds clear to auscultation       Cardiovascular Normal cardiovascular exam Rhythm:Regular Rate:Normal     Neuro/Psych negative neurological ROS  negative psych ROS   GI/Hepatic Hx of Crohns Dz   Endo/Other    Renal/GU      Musculoskeletal   Abdominal   Peds  Hematology Hgb 11.6 Plt 281   Anesthesia Other Findings   Reproductive/Obstetrics (+) Pregnancy                             Anesthesia Physical Anesthesia Plan  ASA: II  Anesthesia Plan: Epidural   Post-op Pain Management:    Induction:   PONV Risk Score and Plan:   Airway Management Planned:   Additional Equipment:   Intra-op Plan:   Post-operative Plan:   Informed Consent: I have reviewed the patients History and Physical, chart, labs and discussed the procedure including the risks, benefits and alternatives for the proposed anesthesia with the patient or authorized representative who has indicated his/her understanding and acceptance.       Plan Discussed with:   Anesthesia Plan Comments: (G1P0 for LEA)        Anesthesia Quick Evaluation

## 2018-08-12 NOTE — H&P (Signed)
Carla Clark is a 33 y.o.prime female presenting at 24 3/7wks for cramps and diarrhea. Pt noted to be in active labor in MAU - dilated 5-6cm. Pt is dated per a 6 week [place]. She had negative carrier and first trimester screen. She was followed closely in past few weeks due to 2vc. She has a history of crohns disease. She is GBS positive with no allergies.  OB History    Gravida  1   Para  0   Term  0   Preterm  0   AB  0   Living  0     SAB  0   TAB  0   Ectopic  0   Multiple  0   Live Births  0          Past Medical History:  Diagnosis Date  . Anemia 11/2011   iron deficiency. 01/2013 parenteral iron. Intranasal B12.  Dr Marin Olp follows.   . B12 deficiency 11/2012  . Breast mass in female 04/21/2012  . Cervical cancer screening 01/27/2012  . Chicken pox as a child  . Crohn's disease ([place]) 2013   ileitis 10/2011. ileitis, mesenteric abscess, ? entero-enteric fistula on 08/2012 CT enterography  . Dermatitis 08/22/2016  . GERD (gastroesophageal reflux disease)   . Hepatitis B immune 07/2012   previous vaccination.   Mindi Slicker 09/2014   per Dermatologist.  Rx Zyrtec, Benadryl.   . Small bowel obstruction ([place])   . Vitamin D deficiency 2014   Past Surgical History:  Procedure Laterality Date  . WISDOM TOOTH EXTRACTION  33 yrs old   Family History: family history includes Breast cancer in her paternal aunt; Cancer in her paternal aunt and paternal uncle; Cancer (age of onset: 35) in her paternal grandmother; Gout in her maternal grandfather; Heart attack in her maternal grandfather; Lung cancer in her maternal grandmother; Nephrolithiasis in her father; Other (age of onset: 42) in her paternal uncle; Ovarian cancer in her paternal aunt and paternal grandmother; Pancreatic cancer in her paternal grandfather and paternal uncle. Social History:  reports that she has never smoked. She has never used smokeless tobacco. She reports current alcohol use. She reports that she does not use  drugs.     Maternal Diabetes: No Genetic Screening: Normal Maternal Ultrasounds/Referrals: Normal Fetal Ultrasounds or other Referrals:  None Maternal Substance Abuse:  No Significant Maternal Medications:  None Significant Maternal Lab Results:  Group B Strep positive Other Comments:  None  Review of Systems  Constitutional: Negative for chills, fever, malaise/fatigue and weight loss.  Eyes: Negative for blurred vision and double vision.  Respiratory: Negative for shortness of breath.   Cardiovascular: Negative for chest pain.  Gastrointestinal: Positive for abdominal pain, diarrhea and nausea. Negative for heartburn.  Genitourinary: Negative for dysuria.  Musculoskeletal: Positive for myalgias.  Skin: Negative for itching and rash.  Neurological: Negative for dizziness and headaches.  Endo/Heme/Allergies: Does not bruise/bleed easily.  Psychiatric/Behavioral: Negative for depression, hallucinations, substance abuse and suicidal ideas. The patient is not nervous/anxious.    Maternal Medical History:  Reason for admission: Contractions and nausea.   Contractions: Onset was 3-5 hours ago.   Frequency: regular.   Perceived severity is strong.    Fetal activity: Perceived fetal activity is normal.   Last perceived fetal movement was within the past hour.    Prenatal complications: no prenatal complications Prenatal Complications - Diabetes: none.    Dilation: 8 Effacement (%): 100 Station: 0 Exam by:: Odilon Cass Blood pressure  123/77, pulse 68, temperature 97.9 F (36.6 C), temperature source Oral, resp. rate 18, height 5' 4"  (1.626 m), weight 68.1 kg, last menstrual period 11/10/2017, SpO2 100 %. Maternal Exam:  Uterine Assessment: Contraction strength is firm.  Contraction frequency is regular.   Abdomen: Patient reports generalized tenderness.  Estimated fetal weight is AGA.    Introitus: Normal vulva. Vulva is negative for condylomata and lesion.  Normal vagina.   Vagina is negative for condylomata.  Amniotic fluid character: clear.  Pelvis: adequate for delivery.   Cervix: Cervix evaluated by digital exam.     Fetal Exam Fetal Monitor Review: Baseline rate: 125.  Variability: moderate (6-25 bpm).   Pattern: accelerations present and no decelerations.    Fetal State Assessment: Category I - tracings are normal.     Physical Exam  Constitutional: She is oriented to person, place, and time. She appears well-developed and well-nourished.  Neck: Normal range of motion.  Cardiovascular: Normal rate.  GI: Soft. There is generalized abdominal tenderness.  Genitourinary:    Vulva, vagina and uterus normal.     No vulval condylomata or lesion noted.   Musculoskeletal: Normal range of motion.  Neurological: She is alert and oriented to person, place, and time.  Skin: Skin is warm.  Psychiatric: She has a normal mood and affect. Her behavior is normal. Judgment and thought content normal.    Prenatal labs: ABO, Rh: --/--/B POS, B POS Performed at [facility], [place] [address] [place], [place] [address]  [phone]) Antibody: NEG (07/19 1741) Rubella:   RPR:    HBsAg:    HIV:    GBS:     Assessment/Plan: 32yo Prime at 76 3/[redacted]wks gestation in active labor GBS positive - treated with PCN S/p Epidural Anticipate svd  Anila Bojarski W Tajai Suder 08/12/2018, 8:32 PM

## 2018-08-12 NOTE — MAU Provider Note (Signed)
S: Patient is here for RN labor evaluation. Strip, vital signs, & chart Reviewed   O:  Vitals:   08/12/18 1458 08/12/18 1505 08/12/18 1515  BP:  126/82 137/81  Pulse:  98 (!) 106  Resp:  16   Temp:  98.9 F (37.2 C)   TempSrc:  Oral   SpO2:  98%   Weight: 68.1 kg    Height: 5' 4"  (1.626 m)     No results found for this or any previous visit (from the past 24 hour(s)).  Dilation: 5.5 Effacement (%): 80 Cervical Position: Middle Station: -1 Presentation: Vertex Exam by:: christina robinson rn/ Asencion Gowda RN   FHR: 140 bpm, Mod Var, variable Decels x 2, 15x15 Accels UC: difficult to trace   A: Active labor -has made cervical change while in MAU  P:  RN to call Dr. Terri Piedra for admission orders  Jorje Guild FNP 4:49 PM

## 2018-08-12 NOTE — Progress Notes (Signed)
Patient ID: Carla Clark, female   DOB: February 13, 1985, 33 y.o.   MRN: [id number] Pt with no complaints but reports increased pelvic pressure VSS EFM - cat 1 TOCO - ctxs q 1-22mns SVE - c/c/0  A/P: Prime completely dilated but comfortable with epidural         Attempted pushing with no urge and no descent         Will labor down for an hour then resume pushing\          Anticipate svd

## 2018-08-12 NOTE — MAU Note (Signed)
Carla Clark is a 33 y.o. at 34w3dhere in MAU reporting: feeling lots of lower abdominal tightness since 0400. States she had diarrhea at the time, has a hx of Crohn's. Feeling some lower abdominal cramping about every 10 minutes, states the pains are still short. Unsure if the pains are early labor or related to Crohn's, called her doctor and was advised to come in. No bleeding, no LOF. +FM. Was 1cm a couple of weeks ago.  Onset of complaint: 0400  Pain score: 6/10  Vitals:   08/12/18 1505  BP: 126/82  Pulse: 98  Resp: 16  Temp: 98.9 F (37.2 C)  SpO2: 98%     FHT: +FM  Lab orders placed from triage: none

## 2018-08-13 ENCOUNTER — Encounter (HOSPITAL_COMMUNITY): Payer: Self-pay | Admitting: Obstetrics and Gynecology

## 2018-08-13 LAB — CBC
HCT: 32.1 % — ABNORMAL LOW (ref 36.0–46.0)
Hemoglobin: 9.8 g/dL — ABNORMAL LOW (ref 12.0–15.0)
MCH: 26.3 pg (ref 26.0–34.0)
MCHC: 30.5 g/dL (ref 30.0–36.0)
MCV: 86.1 fL (ref 80.0–100.0)
Platelets: 252 10*3/uL (ref 150–400)
RBC: 3.73 MIL/uL — ABNORMAL LOW (ref 3.87–5.11)
RDW: 13.8 % (ref 11.5–15.5)
WBC: 13.7 10*3/uL — ABNORMAL HIGH (ref 4.0–10.5)
nRBC: 0 % (ref 0.0–0.2)

## 2018-08-13 MED ORDER — IBUPROFEN 600 MG PO TABS
600.0000 mg | ORAL_TABLET | Freq: Four times a day (QID) | ORAL | Status: DC
  Administered 2018-08-13 – 2018-08-14 (×6): 600 mg via ORAL
  Filled 2018-08-13 (×6): qty 1

## 2018-08-13 MED ORDER — OXYCODONE HCL 5 MG PO TABS: 10.0000 mg | ORAL_TABLET | ORAL | Status: DC | PRN

## 2018-08-13 MED ORDER — TETANUS-DIPHTH-ACELL PERTUSSIS 5-2.5-18.5 LF-MCG/0.5 IM SUSP: 0.5000 mL | Freq: Once | INTRAMUSCULAR | Status: DC

## 2018-08-13 MED ORDER — WITCH HAZEL-GLYCERIN EX PADS: 1.0000 "application " | MEDICATED_PAD | CUTANEOUS | Status: DC | PRN

## 2018-08-13 MED ORDER — ONDANSETRON HCL 4 MG PO TABS: 4.0000 mg | ORAL_TABLET | ORAL | Status: DC | PRN

## 2018-08-13 MED ORDER — OXYCODONE HCL 5 MG PO TABS: 5.0000 mg | ORAL_TABLET | ORAL | Status: DC | PRN

## 2018-08-13 MED ORDER — BENZOCAINE-MENTHOL 20-0.5 % EX AERO
1.0000 "application " | INHALATION_SPRAY | CUTANEOUS | Status: DC | PRN
  Filled 2018-08-13: qty 56

## 2018-08-13 MED ORDER — ACETAMINOPHEN 325 MG PO TABS
650.0000 mg | ORAL_TABLET | ORAL | Status: DC | PRN
  Administered 2018-08-13: 650 mg via ORAL
  Filled 2018-08-13: qty 2

## 2018-08-13 MED ORDER — SIMETHICONE 80 MG PO CHEW: 80.0000 mg | CHEWABLE_TABLET | ORAL | Status: DC | PRN

## 2018-08-13 MED ORDER — DIPHENHYDRAMINE HCL 25 MG PO CAPS: 25.0000 mg | ORAL_CAPSULE | Freq: Four times a day (QID) | ORAL | Status: DC | PRN

## 2018-08-13 MED ORDER — SENNOSIDES-DOCUSATE SODIUM 8.6-50 MG PO TABS
2.0000 | ORAL_TABLET | ORAL | Status: DC
  Filled 2018-08-13: qty 2

## 2018-08-13 MED ORDER — PRENATAL MULTIVITAMIN CH
1.0000 | ORAL_TABLET | Freq: Every day | ORAL | Status: DC
  Administered 2018-08-13 – 2018-08-14 (×2): 1 via ORAL
  Filled 2018-08-13 (×2): qty 1

## 2018-08-13 MED ORDER — DIBUCAINE (PERIANAL) 1 % EX OINT: 1.0000 "application " | TOPICAL_OINTMENT | CUTANEOUS | Status: DC | PRN

## 2018-08-13 MED ORDER — ZOLPIDEM TARTRATE 5 MG PO TABS: 5.0000 mg | ORAL_TABLET | Freq: Every evening | ORAL | Status: DC | PRN

## 2018-08-13 MED ORDER — ONDANSETRON HCL 4 MG/2ML IJ SOLN: 4.0000 mg | INTRAMUSCULAR | Status: DC | PRN

## 2018-08-13 MED ORDER — COCONUT OIL OIL: 1.0000 "application " | TOPICAL_OIL | Status: DC | PRN

## 2018-08-13 NOTE — Lactation Note (Signed)
This note was copied from a baby's chart. Lactation Consultation Note  Patient Name: Carla Clark [id number] Date: 08/13/2018 Reason for consult: Initial assessment;Early term 37-38.6wks;Primapara;Infant < 6lbs Baby is 37.3 weeks and 4-14.8.  He is 65 hours old.  His first few blood sugars were low but most recent was 52.  Baby has both syringe and bottle fed 22 calorie formula.  Discussed late preterm feeding norm and plan due to SGA.  Written information given to parents.  Baby is currently skin to skin on mom's chest and showing feeding cues.  Assisted with positioning baby in cross cradle hold.  Instructed on hand expression and small drop expressed.  Baby opened wide and latched easily and well.  Observed a good feeding for 30 minutes. FOB bottle fed 8 mls of neosure.  Symphony pump set up and initiated.  Plan discussed and questions answered.  Mom will put baby to breast with feeding cues, post pump for 15 minutes and supplement with expressed milk and 5-10 mls of neosure 22 calorie.  Encouraged to call for assist/concerns.  Breastfeeding consultation services and support information given and reviewed.  Maternal Data Has patient been taught Hand Expression?: Yes Does the patient have breastfeeding experience prior to this delivery?: No  Feeding Feeding Type: Breast Fed Nipple Type: Slow - flow  LATCH Score Latch: Grasps breast easily, tongue down, lips flanged, rhythmical sucking.  Audible Swallowing: A few with stimulation  Type of Nipple: Everted at rest and after stimulation  Comfort (Breast/Nipple): Soft / non-tender  Hold (Positioning): Assistance needed to correctly position infant at breast and maintain latch.  LATCH Score: 8  Interventions Interventions: Breast feeding basics reviewed;Position options;Assisted with latch;Skin to skin;Breast massage;Hand express;DEBP;Breast compression;Support pillows  Lactation Tools Discussed/Used Pump Review: Setup, frequency, and  cleaning;Milk Storage Initiated by:: LMoulden Date initiated:: 08/13/18   Consult Status Consult Status: Follow-up Date: 08/14/18 Follow-up type: In-patient    Ave Filter 08/13/2018, 2:32 PM

## 2018-08-13 NOTE — Anesthesia Postprocedure Evaluation (Signed)
Anesthesia Post Note  Patient: Carla Clark  Procedure(s) Performed: AN AD [place]     Patient location during evaluation: Mother Baby Anesthesia Type: Epidural Level of consciousness: awake and alert and oriented Pain management: satisfactory to patient Vital Signs Assessment: post-procedure vital signs reviewed and stable Respiratory status: spontaneous breathing and nonlabored ventilation Cardiovascular status: stable Postop Assessment: no headache, no backache, no signs of nausea or vomiting, adequate PO intake, patient able to bend at knees and able to ambulate (patient up walking) Anesthetic complications: no    Last Vitals:  Vitals:   08/13/18 0258 08/13/18 0659  BP: 115/77 110/70  Pulse: 70 72  Resp: 18 18  Temp: 36.7 C 37.2 C  SpO2: 100% 99%    Last Pain:  Vitals:   08/13/18 0850  TempSrc:   PainSc: 0-No pain   Pain Goal:                   Willa Rough

## 2018-08-13 NOTE — Lactation Note (Signed)
This note was copied from a baby's chart. Lactation Consultation Note  Patient Name: Carla Clark [id number] Date: 08/13/2018 Reason for consult: Follow-up assessment;Mother's request;Infant < 6lbs;Early term 37-38.6wks;1st time breastfeeding;Primapara  Mom requested assistance with latching.  Baby STS after bath, sleeping now.  It has been 3 hrs since baby received 8 ml by bottle of 22 cal formula.    Assisted Mom to do some breast massage and hand expression, while baby moved in position.  A dot of colostrum expressed.  Baby immediately opened widely and with guidance Mom sandwiched her breast using a U hold to facilitate a deeper latch to the breast.  Mom instructed on use of breast compression while baby is sucking.  Showed FOB how to tug on chin to flange lower lip and widen the latch.  Mom knows to limit feeding to 30 mins.  Baby did 10 mins on right breast in cross cradle, and then assisted with football position on left breast.  Nipple abraded on tip.    Mom to supplement with 5-10 ml formula using paced feeding, and double pump after this feeding.  Wrote feeding plan on dry erase board.    Mom encouraged to ask for assistance as needed. RN to provide coconut oil for nipples.  Maternal Data Has patient been taught Hand Expression?: Yes Does the patient have breastfeeding experience prior to this delivery?: No  Feeding Feeding Type: Breast Fed Nipple Type: Slow - flow  LATCH Score Latch: Grasps breast easily, tongue down, lips flanged, rhythmical sucking.  Audible Swallowing: Spontaneous and intermittent  Type of Nipple: Everted at rest and after stimulation  Comfort (Breast/Nipple): Filling, red/small blisters or bruises, mild/mod discomfort  Hold (Positioning): Assistance needed to correctly position infant at breast and maintain latch.  LATCH Score: 8  Interventions Interventions: Breast feeding basics reviewed;Assisted with latch;Skin to skin;Breast massage;Hand  express;Breast compression;Adjust position;Support pillows;Position options;Expressed milk;Coconut oil;DEBP  Lactation Tools Discussed/Used Tools: Bottle;Pump;Coconut oil Breast pump type: Double-Electric Breast Pump Pump Review: Setup, frequency, and cleaning;Milk Storage Initiated by:: LMoulden Date initiated:: 08/13/18   Consult Status Consult Status: Follow-up Date: 08/14/18 Follow-up type: In-patient    Broadus John 08/13/2018, 5:26 PM

## 2018-08-13 NOTE — Progress Notes (Signed)
PPD #1 No problems Afeb, VSS Fundus firm, NT at U-2 Continue routine postpartum care, will do circumcision on boy tomorrow

## 2018-08-14 LAB — RPR: RPR Ser Ql: NONREACTIVE

## 2018-08-14 MED ORDER — ACETAMINOPHEN 325 MG PO TABS: 650.0000 mg | ORAL_TABLET | ORAL | 0 refills | Status: DC | PRN

## 2018-08-14 MED ORDER — IBUPROFEN 600 MG PO TABS: 600.0000 mg | ORAL_TABLET | Freq: Four times a day (QID) | ORAL | 0 refills | Status: DC

## 2018-08-14 NOTE — Lactation Note (Signed)
This note was copied from a baby's chart. Lactation Consultation Note  Patient Name: Carla Clark [id number] Date: 08/14/2018 Reason for consult: Follow-up assessment;Early term 37-38.6wks;Primapara;Infant < 6lbs Baby is 35 hours old/5% weight loss.  Mom states she is very tired today.  She has been trying to post pump but decided to sleep during the night.  Instructed to increase bottle feeds to 10-20 mls every 3 hours.  Baby is doing well with bottle and tolerating increased volumes.  Mom c/o nipple soreness and using coconut oil.  Parents will continue to feed with cues and at least 8-12 feedings/24 hours.  Encouraged to call for assist/concerns prn.  Maternal Data    Feeding    LATCH Score                   Interventions    Lactation Tools Discussed/Used     Consult Status Consult Status: Follow-up Date: 08/15/18 Follow-up type: In-patient    Ave Filter 08/14/2018, 11:25 AM

## 2018-08-14 NOTE — Plan of Care (Signed)
  Problem: Life Cycle: Goal: Ability to make normal progression through stages of labor will improve Outcome: Adequate for Discharge Goal: Ability to effectively push during vaginal delivery will improve Outcome: Adequate for Discharge   Problem: Role Relationship: Goal: Will demonstrate positive interactions with the child Outcome: Adequate for Discharge   Problem: Life Cycle: Goal: Ability to make normal progression through stages of labor will improve Outcome: Adequate for Discharge Goal: Ability to effectively push during vaginal delivery will improve Outcome: Adequate for Discharge   Problem: Safety: Goal: Risk of complications during labor and delivery will decrease Outcome: Adequate for Discharge

## 2018-08-14 NOTE — Progress Notes (Signed)
Post Partum Day 2 Subjective: no complaints, up ad lib and tolerating PO  No BM in 2 days, h/o Crohns  Objective: Blood pressure 118/80, pulse 81, temperature 97.8 F (36.6 C), temperature source Oral, resp. rate 18, height 5' 4"  (1.626 m), weight 68.1 kg, last menstrual period 11/10/2017, SpO2 100 %, unknown if currently breastfeeding.  Physical Exam:  General: alert and cooperative Lochia: appropriate Uterine Fundus: firm  Recent Labs    08/12/18 1741 08/13/18 0403  HGB 11.6* 9.8*  HCT 36.1 32.1*    Assessment/Plan: Discharge home may room in with baby due to low birth weight, d/w her getting scripts   LOS: 2 days   Logan Bores 08/14/2018, 9:34 AM

## 2018-08-14 NOTE — Plan of Care (Signed)
  Problem: Education: Goal: Knowledge of Childbirth will improve Outcome: Progressing Goal: Ability to make informed decisions regarding treatment and plan of care will improve Outcome: Progressing Goal: Ability to state and carry out methods to decrease the pain will improve Outcome: Progressing Goal: Individualized Educational Video(s) Outcome: Progressing   Problem: Coping: Goal: Ability to verbalize concerns and feelings about labor and delivery will improve Outcome: Progressing   Problem: Life Cycle: Goal: Ability to make normal progression through stages of labor will improve 08/14/2018 0735 by Yancey Flemings, RN Outcome: Progressing 08/14/2018 0734 by Yancey Flemings, RN Outcome: Adequate for Discharge Goal: Ability to effectively push during vaginal delivery will improve 08/14/2018 0735 by Yancey Flemings, RN Outcome: Adequate for Discharge 08/14/2018 0734 by Yancey Flemings, RN Outcome: Adequate for Discharge   Problem: Education: Goal: Knowledge of Childbirth will improve Outcome: Progressing Goal: Ability to make informed decisions regarding treatment and plan of care will improve Outcome: Progressing Goal: Ability to state and carry out methods to decrease the pain will improve Outcome: Progressing Goal: Individualized Educational Video(s) Outcome: Progressing   Problem: Coping: Goal: Ability to verbalize concerns and feelings about labor and delivery will improve Outcome: Progressing

## 2018-08-14 NOTE — Discharge Summary (Signed)
OB Discharge Summary     Patient Name: Carla Clark DOB: 1985-04-21 MRN: [id number]  Date of admission: 08/12/2018 Delivering MD: Carlynn Purl [facility]   Date of discharge: 08/14/2018  Admitting diagnosis: cramping,back pain Intrauterine pregnancy: [redacted]w[redacted]d    Secondary diagnosis:  Active Problems:   Indication for care in labor or delivery   SVD (spontaneous vaginal delivery)  Additional problems: GBS positive, 2 vessel cord, Crohns's disease                                            Discharge diagnosis: Term Pregnancy Delivered                                                                                                Post partum procedures:none  Augmentation: AROM  Complications: None  Hospital course:  Onset of Labor With Vaginal Delivery     33y.o. yo G1P1001 at 317w3das admitted in Active Labor on 08/12/2018. Patient had an uncomplicated labor course as follows:  Membrane Rupture Time/Date: 8:28 PM ,08/12/2018   Intrapartum Procedures: Episiotomy: None [1]                                         Lacerations:  2nd degree [3]  Patient had a delivery of a Viable infant. 08/12/2018  Information for the patient's newborn:  Carla, [name]0[[id number]]Delivery Method: Vag-Spont     Pateint had an uncomplicated postpartum course.  She is ambulating, tolerating a regular diet, passing flatus, and urinating well. Patient is discharged home in stable condition on 08/14/18.   Physical exam  Vitals:   08/13/18 0659 08/13/18 1100 08/13/18 1453 08/13/18 2150  BP: 110/70 127/81 125/87 118/80  Pulse: 72 61 84 81  Resp: 18 16 17 18   Temp: 99 F (37.2 C) 98.3 F (36.8 C) 98.1 F (36.7 C) 97.8 F (36.6 C)  TempSrc: Oral Oral Oral Oral  SpO2: 99%  100% 100%  Weight:      Height:       General: alert and cooperative Lochia: appropriate Uterine Fundus: firm  Labs: Lab Results  Component Value Date   WBC 13.7 (H) 08/13/2018   HGB 9.8 (L) 08/13/2018   HCT 32.1  (L) 08/13/2018   MCV 86.1 08/13/2018   PLT 252 08/13/2018   CMP Latest Ref Rng & Units 08/24/2017  Glucose 70 - 99 mg/dL 82  BUN 6 - 23 mg/dL 10  Creatinine 0.40 - 1.20 mg/dL 0.74  Sodium 135 - 145 mEq/L 138  Potassium 3.5 - 5.1 mEq/L 4.0  Chloride 96 - 112 mEq/L 105  CO2 19 - 32 mEq/L 28  Calcium 8.4 - 10.5 mg/dL 9.7  Total Protein 6.0 - 8.3 g/dL 7.1  Total Bilirubin 0.2 - 1.2 mg/dL 0.5  Alkaline Phos 39 - 117 U/L 51  AST 0 - 37 U/L 13  ALT 0 - 35 U/L 13    Discharge instruction: per After Visit Summary and "Baby and Me Booklet".  After visit meds:  Allergies as of 08/14/2018   No Known Allergies     Medication List    TAKE these medications   acetaminophen 325 MG tablet Commonly known as: Tylenol Take 2 tablets (650 mg total) by mouth every 4 (four) hours as needed (for pain scale < 4).   Adalimumab 40 MG/0.4ML Pnkt Commonly known as: Humira Pen Inject 40 mg into the skin every 14 (fourteen) days.   ibuprofen 600 MG tablet Commonly known as: ADVIL Take 1 tablet (600 mg total) by mouth every 6 (six) hours.   Misc Intestinal Flora Regulat Chew Chew by mouth every other day. Digestive Health probiotic gummies by Schiff   SYRINGE-NEEDLE (DISP) 3 ML 25G X 5/8" 3 ML Misc Use as directed for B12 injections.   triamcinolone ointment 0.1 % Commonly known as: KENALOG daily as needed.   vitamin B-12 100 MCG tablet Commonly known as: CYANOCOBALAMIN Take 100 mcg by mouth daily.       Diet: routine diet  Activity: Advance as tolerated. Pelvic rest for 6 weeks.   Outpatient follow up:6 weeks Follow up Appt: Future Appointments  Date Time Provider [place]  10/25/2018  1:20 PM Mosie Lukes, MD [facility]   Follow up Visit:No follow-ups on file.  Postpartum contraception: Undecided  Newborn Data: Live born female  Birth Weight: 4 lb 14.8 oz (2234 g) APGAR: 8, 9  Newborn Delivery   Birth date/time: 08/12/2018 23:39:00 Delivery type: Vaginal,  Spontaneous      Baby Feeding: Breast Disposition:rooming in vs home, to be decided   08/14/2018 Carla Bores, MD

## 2018-08-15 ENCOUNTER — Ambulatory Visit: Payer: Self-pay

## 2018-08-15 NOTE — Lactation Note (Signed)
This note was copied from a baby's chart. Lactation Consultation Note  Patient Name: Carla Clark [id number] Date: 08/15/2018 Reason for consult: Follow-up assessment   Baby 40 hours old and recently bf for 30 min.  Mother states she is feeling more comfortable with bf.  Discussed frequency and length of session w/ 4 lb infant. FOB supplemented with formula via slow flow nipple after bf. Reviewed volume guidelines increasing per day of life and as baby desires. Mother asked for reivew of pumping and has not pumped since yesterday. She coated breasts and flanges with lots of coconut oil.  Suggest using ebm and minimal coconut oil. Mother pumped and transitional breastmilk flowing.  Will give breastmilk at next feeding with the difference in formula.  Reviewed milk storage. Feed on demand approximately 8-12 times per day at least q 3 hours. Reviewed engorgement care and monitoring voids/stools. Mother has personal DEBP at home.      Maternal Data    Feeding Feeding Type: Breast Fed Nipple Type: Slow - flow  LATCH Score Latch: Repeated attempts needed to sustain latch, nipple held in mouth throughout feeding, stimulation needed to elicit sucking reflex.  Audible Swallowing: Spontaneous and intermittent  Type of Nipple: Everted at rest and after stimulation  Comfort (Breast/Nipple): Soft / non-tender  Hold (Positioning): Assistance needed to correctly position infant at breast and maintain latch.  LATCH Score: 8  Interventions Interventions: Breast feeding basics reviewed;DEBP;Coconut oil  Lactation Tools Discussed/Used     Consult Status Consult Status: Complete Date: 08/15/18    Carla Clark [facility] 08/15/2018, 9:25 AM

## 2018-08-16 ENCOUNTER — Ambulatory Visit: Payer: Self-pay

## 2018-08-16 NOTE — Lactation Note (Signed)
This note was copied from a baby's chart. Lactation Consultation Note  Patient Name: Carla Clark [id number] Date: 08/16/2018 Reason for consult: Follow-up assessment;Hyperbilirubinemia;Early term 37-38.6wks;Infant < 6lbs Baby is 14 hours old.  Baby has gained weight and bilirubin is down so discharge is likely.  Parents feel feeding plan is going well.  Nipples healing.  Mom wearing shells.  She also has comfort gels.  Breasts are full but not engorged.  Discussed prevention and treatment of engorgement.  Mom has a Medela DEBP at home.  Recommended an outpatient appointment for feeding assessment and plan early next week.  Maternal Data Has patient been taught Hand Expression?: Yes Does the patient have breastfeeding experience prior to this delivery?: No  Feeding Feeding Type: Breast Milk  LATCH Score          Comfort (Breast/Nipple): Filling, red/small blisters or bruises, mild/mod discomfort        Interventions Interventions: DEBP;Expressed milk;Breast massage;Reverse pressure;Breast compression;Shells;Pre-pump if needed;Hand express;Coconut oil;Comfort gels  Lactation Tools Discussed/Used Tools: Pump;Shells Shell Type: Inverted Breast pump type: Double-Electric Breast Pump Pump Review: Setup, frequency, and cleaning;Milk Storage   Consult Status Consult Status: Complete Date: 08/17/18 Follow-up type: Call as needed    Ave Filter 08/16/2018, 9:36 AM

## 2018-08-16 NOTE — Lactation Note (Signed)
This note was copied from a baby's chart. Lactation Consultation Note Baby 57 hrs old cont. On DPT. Mom states baby is BF well. Mom has scabs bilaterally to nipples in vertical lines.  Mom states her nipples are chapped. Nipples looks as if were raw and has scabbed over. Mom has coconut oil. Encouraged to apply before pumping. Encouraged to let BM air dry to nipples. Discussed positioning of baby. Mom's nipples are flattening d/t breast filling. Compressible at this time. Encouraged mom to pre-pump or finger massage and express to soften breast tissue before latching. Shells given to wear to evert nipples for deeper latch. Comfort gels to wear after feeding, not to have coconut oil on nipples.  LC assessed breast. Mom stated breast are feeling full. Some knots noted.  LC started mom pumping w/DEBP, LC massage one breast mom massaged the other.  Mom pumped 17 ml transitional milk. Discussed engorgement management.  After pumping, mom laid flat to rest. Stressed importance of wearing her bra and assessing breast not letting them get engorged. Pump when feeling heavy. Reviewed supplementing baby w/BM then giving formula separate to equal amount needed according to hours of age. Mom stated baby spits up if given more than 25 ml. Discussed w/mom burping half way, then keeping baby elevated 20 min. After feeding w/lights on.  Answered mom's questions. Reviewed milk storage.  Patient Name: Boy Chala Gul [id number] Date: 08/16/2018 Reason for consult: Follow-up assessment;Early term 37-38.6wks;1st time breastfeeding   Maternal Data Has patient been taught Hand Expression?: Yes Does the patient have breastfeeding experience prior to this delivery?: No  Feeding Feeding Type: Formula  LATCH Score          Comfort (Breast/Nipple): Filling, red/small blisters or bruises, mild/mod discomfort        Interventions Interventions: DEBP;Expressed milk;Breast massage;Reverse  pressure;Breast compression;Shells;Pre-pump if needed;Hand express;Coconut oil;Comfort gels  Lactation Tools Discussed/Used Tools: Pump;Shells Shell Type: Inverted Breast pump type: Double-Electric Breast Pump Pump Review: Setup, frequency, and cleaning;Milk Storage   Consult Status Consult Status: Follow-up Date: 08/17/18 Follow-up type: In-patient    Theodoro Kalata 08/16/2018, 5:49 AM

## 2018-08-27 ENCOUNTER — Encounter: Payer: Commercial Managed Care - PPO | Admitting: Family Medicine

## 2018-10-25 ENCOUNTER — Encounter: Payer: Commercial Managed Care - PPO | Admitting: Family Medicine

## 2018-12-17 ENCOUNTER — Other Ambulatory Visit: Payer: Self-pay | Admitting: Internal Medicine

## 2018-12-24 ENCOUNTER — Telehealth: Payer: Self-pay | Admitting: Internal Medicine

## 2018-12-24 NOTE — Telephone Encounter (Signed)
Looks like pt needs an appt for refills per med list

## 2018-12-24 NOTE — Telephone Encounter (Signed)
Left message on machine to call back  

## 2018-12-25 ENCOUNTER — Telehealth: Payer: Self-pay | Admitting: Internal Medicine

## 2018-12-25 DIAGNOSIS — K5000 Crohn's disease of small intestine without complications: Secondary | ICD-10-CM

## 2018-12-25 LAB — HM PAP SMEAR: HM Pap smear: NEGATIVE

## 2018-12-25 NOTE — Telephone Encounter (Signed)
Pt was last seen on 08-18-2016. Last TB gold done in 2017. She is scheduled for a follow up on 02-13-2019. Please advise on refills.

## 2018-12-26 ENCOUNTER — Other Ambulatory Visit (INDEPENDENT_AMBULATORY_CARE_PROVIDER_SITE_OTHER): Payer: Commercial Managed Care - PPO

## 2018-12-26 DIAGNOSIS — K5000 Crohn's disease of small intestine without complications: Secondary | ICD-10-CM

## 2018-12-26 LAB — COMPREHENSIVE METABOLIC PANEL
ALT: 13 U/L (ref 0–35)
AST: 11 U/L (ref 0–37)
Albumin: 3.3 g/dL — ABNORMAL LOW (ref 3.5–5.2)
Alkaline Phosphatase: 67 U/L (ref 39–117)
BUN: 8 mg/dL (ref 6–23)
CO2: 27 mEq/L (ref 19–32)
Calcium: 8.7 mg/dL (ref 8.4–10.5)
Chloride: 105 mEq/L (ref 96–112)
Creatinine, Ser: 0.73 mg/dL (ref 0.40–1.20)
GFR: 92.79 mL/min (ref >60.00)
Glucose, Bld: 92 mg/dL (ref 70–99)
Potassium: 3.7 mEq/L (ref 3.5–5.1)
Sodium: 138 mEq/L (ref 135–145)
Total Bilirubin: 0.3 mg/dL (ref 0.2–1.2)
Total Protein: 6.5 g/dL (ref 6.0–8.3)

## 2018-12-26 LAB — CBC WITH DIFFERENTIAL/PLATELET
Basophils Absolute: 0.1 10*3/uL (ref 0.0–0.1)
Basophils Relative: 0.8 % (ref 0.0–3.0)
Eosinophils Absolute: 0.2 10*3/uL (ref 0.0–0.7)
Eosinophils Relative: 2.7 % (ref 0.0–5.0)
HCT: 34 % — ABNORMAL LOW (ref 36.0–46.0)
Hemoglobin: 10.6 g/dL — ABNORMAL LOW (ref 12.0–15.0)
Lymphocytes Relative: 37.2 % (ref 12.0–46.0)
Lymphs Abs: 2.7 10*3/uL (ref 0.7–4.0)
MCHC: 31.3 g/dL (ref 30.0–36.0)
MCV: 77.4 fl — ABNORMAL LOW (ref 78.0–100.0)
Monocytes Absolute: 0.9 10*3/uL (ref 0.1–1.0)
Monocytes Relative: 13 % — ABNORMAL HIGH (ref 3.0–12.0)
Neutro Abs: 3.3 10*3/uL (ref 1.4–7.7)
Neutrophils Relative %: 46.3 % (ref 43.0–77.0)
Platelets: 433 10*3/uL — ABNORMAL HIGH (ref 150.0–400.0)
RBC: 4.39 Mil/uL (ref 3.87–5.11)
RDW: 14.8 % (ref 11.5–15.5)
WBC: 7.2 10*3/uL (ref 4.0–10.5)

## 2018-12-26 MED ORDER — HUMIRA (2 PEN) 40 MG/0.4ML ~~LOC~~ AJKT: 40.0000 mg | AUTO-INJECTOR | SUBCUTANEOUS | 1 refills | Status: DC

## 2018-12-26 NOTE — Telephone Encounter (Signed)
Definitely needs to be seen for follow-up I do not recommend interrupting anti-TNF therapy as she could develop antibodies Thus would recommend that she come for CBC, CMP and QuantiFERON gold Humira 40 mg subcutaneous every 14 days can be renewed until her follow-up appointment

## 2018-12-26 NOTE — Telephone Encounter (Signed)
Patient has been notified and aware to keep follow up as scheduled and have labs drawn ASAP. Follow up is scheduled for 02-14-2019. Refill of Humira has been given with 1 RF only

## 2018-12-27 ENCOUNTER — Other Ambulatory Visit (INDEPENDENT_AMBULATORY_CARE_PROVIDER_SITE_OTHER): Payer: Commercial Managed Care - PPO

## 2018-12-27 DIAGNOSIS — K5090 Crohn's disease, unspecified, without complications: Secondary | ICD-10-CM

## 2018-12-27 LAB — IBC + FERRITIN
Ferritin: 15.2 ng/mL (ref 10.0–291.0)
Iron: 14 ug/dL — ABNORMAL LOW (ref 42–145)
Saturation Ratios: 4.1 % — ABNORMAL LOW (ref 20.0–50.0)
Transferrin: 246 mg/dL (ref 212.0–360.0)

## 2018-12-27 NOTE — Telephone Encounter (Signed)
Working on prior [place] for Humira.

## 2018-12-28 ENCOUNTER — Telehealth: Payer: Self-pay | Admitting: Internal Medicine

## 2018-12-28 LAB — QUANTIFERON-TB GOLD PLUS
Mitogen-NIL: >10 IU/mL
NIL: 2.86 IU/mL
QuantiFERON-TB Gold Plus: NEGATIVE
TB1-NIL: 0.11 IU/mL
TB2-NIL: 0.38 IU/mL

## 2018-12-28 MED ORDER — HUMIRA (2 PEN) 40 MG/0.4ML ~~LOC~~ AJKT: 40.0000 mg | AUTO-INJECTOR | SUBCUTANEOUS | 11 refills | Status: DC

## 2018-12-28 NOTE — Telephone Encounter (Signed)
Prescription sent to pharmacy for pt. Prior auth obtained for med also. IQN#-[id number]

## 2018-12-31 ENCOUNTER — Other Ambulatory Visit: Payer: Self-pay

## 2018-12-31 DIAGNOSIS — K5000 Crohn's disease of small intestine without complications: Secondary | ICD-10-CM

## 2018-12-31 DIAGNOSIS — D509 Iron deficiency anemia, unspecified: Secondary | ICD-10-CM

## 2019-01-14 ENCOUNTER — Ambulatory Visit (INDEPENDENT_AMBULATORY_CARE_PROVIDER_SITE_OTHER): Payer: Commercial Managed Care - PPO | Admitting: Family Medicine

## 2019-01-14 ENCOUNTER — Other Ambulatory Visit: Payer: Self-pay

## 2019-01-14 ENCOUNTER — Other Ambulatory Visit: Payer: Self-pay | Admitting: Family

## 2019-01-14 ENCOUNTER — Encounter: Payer: Self-pay | Admitting: Family Medicine

## 2019-01-14 VITALS — Ht 63.0 in | Wt 130.0 lb

## 2019-01-14 DIAGNOSIS — L309 Dermatitis, unspecified: Secondary | ICD-10-CM

## 2019-01-14 DIAGNOSIS — Z0000 Encounter for general adult medical examination without abnormal findings: Secondary | ICD-10-CM

## 2019-01-14 DIAGNOSIS — D508 Other iron deficiency anemias: Secondary | ICD-10-CM | POA: Diagnosis not present

## 2019-01-14 DIAGNOSIS — K5000 Crohn's disease of small intestine without complications: Secondary | ICD-10-CM | POA: Diagnosis not present

## 2019-01-14 MED ORDER — MUPIROCIN 2 % EX OINT: 1.0000 "application " | TOPICAL_OINTMENT | Freq: Two times a day (BID) | CUTANEOUS | 5 refills | Status: DC

## 2019-01-14 NOTE — Assessment & Plan Note (Signed)
Doing well on Humira

## 2019-01-14 NOTE — Progress Notes (Signed)
Virtual Visit via Video Note  I connected with Carla Clark on 01/14/19 at  9:20 AM EST by a video enabled telemedicine application and verified that I am speaking with the correct person using two identifiers.  Location: Patient: home Provider: home   I discussed the limitations of evaluation and management by telemedicine and the availability of in person appointments. The patient expressed understanding and agreed to proceed. Magdalene Molly, CMA was able to get the patient set up on a visit, video   Subjective:    Patient ID: Carla Clark, female    DOB: 09/21/1985, 33 y.o.   MRN: [id number]  No chief complaint on file.   HPI Patient is in today for annual preventative exam and follow up on chronic medical concerns including eczema, crohn's disease, anemia and more. She feels well today.no recent febrile illness or hospitalizations. She had her first child this pst summer and is still nursing and slowly weaning her  89 month old son. 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 Denies CP/palp/SOB/HA/congestion/fevers/GI or GU c/o. Taking meds as prescribed. She is eating well and staying active.  Past Medical History:  Diagnosis Date  . Anemia 11/2011   iron deficiency. 01/2013 parenteral iron. Intranasal B12.  Dr Marin Olp follows.   . B12 deficiency 11/2012  . Breast mass in female 04/21/2012  . Cervical cancer screening 01/27/2012  . Chicken pox as a child  . Crohn's disease ([place]) 2013   ileitis 10/2011. ileitis, mesenteric abscess, ? entero-enteric fistula on 08/2012 CT enterography  . Dermatitis 08/22/2016  . GERD (gastroesophageal reflux disease)   . Hepatitis B immune 07/2012   previous vaccination.   Mindi Slicker 09/2014   per Dermatologist.  Rx Zyrtec, Benadryl.   . Small bowel obstruction ([place])   . SVD (spontaneous vaginal delivery) 08/13/2018  . Vitamin D deficiency 2014    Past Surgical History:  Procedure Laterality Date  .  WISDOM TOOTH EXTRACTION  33 yrs old    Family History  Problem Relation Age of Onset  . Nephrolithiasis Father   . Lung cancer Maternal Grandmother   . Gout Maternal Grandfather   . Heart attack Maternal Grandfather   . Ovarian cancer Paternal Grandmother   . Cancer Paternal Grandmother 71       ovarian  . Pancreatic cancer Paternal Grandfather   . Ovarian cancer Paternal Aunt        ovarian  . Breast cancer Paternal Aunt   . Cancer Paternal Aunt        breast (2019) and ovarian cancer (2005)  . Other Paternal Uncle 30       cyst removed from pancreas  . Pancreatic cancer Paternal Uncle        Stage 1  . Cancer Paternal Uncle        pancreatic    Social History   Socioeconomic History  . Marital status: Married    Spouse name: Not on file  . Number of children: Not on file  . Years of education: Not on file  . Highest education level: Not on file  Occupational History  . 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 Smoking status: Never Smoker  . Smokeless tobacco: Never Used  . Tobacco comment: never used tobacco  Substance and Sexual Activity  . Alcohol use: Yes    Alcohol/week: 0.0 standard drinks    Comment: occasionaly  .  Drug use: No  . Sexual activity: Yes    Partners: Male    Birth control/protection: None  Other Topics Concern  . Not on file  Social History Narrative  . Not on file   Social Determinants of Health   Financial Resource Strain:   . Difficulty of Paying Living Expenses: Not on file  Food Insecurity:   . Worried About Charity fundraiser in the Last Year: Not on file  . Ran Out of Food in the Last Year: Not on file  Transportation Needs:   . Lack of Transportation (Medical): Not on file  . Lack of Transportation (Non-Medical): Not on file  Physical Activity:   . Days of Exercise per Week: Not on file  . Minutes of Exercise per Session: Not on file  Stress:   . Feeling of Stress : Not on file  Social  Connections:   . Frequency of Communication with Friends and Family: Not on file  . Frequency of Social Gatherings with Friends and Family: Not on file  . Attends Religious Services: Not on file  . Active Member of Clubs or Organizations: Not on file  . Attends Archivist Meetings: Not on file  . Marital Status: Not on file  Intimate Partner Violence:   . Fear of Current or Ex-Partner: Not on file  . Emotionally Abused: Not on file  . Physically Abused: Not on file  . Sexually Abused: Not on file    Outpatient Medications Prior to Visit  Medication Sig Dispense Refill  . acetaminophen (TYLENOL) 325 MG tablet Take 2 tablets (650 mg total) by mouth every 4 (four) hours as needed (for pain scale < 4). 30 tablet 0  . Adalimumab (HUMIRA PEN) 40 MG/0.4ML PNKT Inject 40 mg into the skin every 14 (fourteen) days. 2 each 1  . Adalimumab (HUMIRA PEN) 40 MG/0.4ML PNKT Inject 40 mg into the skin every 14 (fourteen) days. 2 each 11  . ibuprofen (ADVIL) 600 MG tablet Take 1 tablet (600 mg total) by mouth every 6 (six) hours. 30 tablet 0  . Probiotic Product (MISC INTESTINAL FLORA REGULAT) CHEW Chew by mouth every other day. Digestive Health probiotic gummies by Schiff    . SYRINGE-NEEDLE, DISP, 3 ML 25G X 5/8" 3 ML MISC Use as directed for B12 injections. 50 each 0  . triamcinolone ointment (KENALOG) 0.1 % daily as needed.  0  . vitamin B-12 (CYANOCOBALAMIN) 100 MCG tablet Take 100 mcg by mouth daily.     No facility-administered medications prior to visit.    No Known Allergies  Review of Systems  Constitutional: Negative for chills, fever and malaise/fatigue.  HENT: Negative for congestion and hearing loss.   Eyes: Negative for discharge.  Respiratory: Negative for cough, sputum production and shortness of breath.   Cardiovascular: Negative for chest pain, palpitations and leg swelling.  Gastrointestinal: Negative for abdominal pain, blood in stool, constipation, diarrhea,  heartburn, nausea and vomiting.  Genitourinary: Negative for dysuria, frequency, hematuria and urgency.  Musculoskeletal: Negative for back pain, falls and myalgias.  Skin: Negative for rash.  Neurological: Negative for dizziness, sensory change, loss of consciousness, weakness and headaches.  Endo/Heme/Allergies: Negative for environmental allergies. Does not bruise/bleed easily.  Psychiatric/Behavioral: Negative for depression and suicidal ideas. The patient is not nervous/anxious and does not have insomnia.        Objective:    Physical Exam Constitutional:      Appearance: Normal appearance. She is not ill-appearing.  HENT:  Head: Normocephalic and atraumatic.     Nose: Nose normal.  Eyes:     General:        Right eye: No discharge.        Left eye: No discharge.  Pulmonary:     Effort: Pulmonary effort is normal.  Neurological:     Mental Status: She is alert and oriented to person, place, and time.  Psychiatric:        Mood and Affect: Mood normal.        Behavior: Behavior normal.     Ht 5' 3"  (1.6 m)   Wt 130 lb (59 kg)   BMI 23.03 kg/m  Wt Readings from Last 3 Encounters:  01/14/19 130 lb (59 kg)  08/12/18 150 lb 1.6 oz (68.1 kg)  01/04/18 131 lb (59.4 kg)    Diabetic Foot Exam - Simple   No data filed     Lab Results  Component Value Date   WBC 7.2 12/26/2018   HGB 10.6 Repeated and verified X2. (L) 12/26/2018   HCT 34.0 (L) 12/26/2018   PLT 433.0 (H) 12/26/2018   GLUCOSE 92 12/26/2018   CHOL 139 08/24/2017   TRIG 64.0 08/24/2017   HDL 61.80 08/24/2017   LDLCALC 65 08/24/2017   ALT 13 12/26/2018   AST 11 12/26/2018   NA 138 12/26/2018   K 3.7 12/26/2018   CL 105 12/26/2018   CREATININE 0.73 12/26/2018   BUN 8 12/26/2018   CO2 27 12/26/2018   TSH 1.159 05/12/2017    Lab Results  Component Value Date   TSH 1.159 05/12/2017   Lab Results  Component Value Date   WBC 7.2 12/26/2018   HGB 10.6 Repeated and verified X2. (L) 12/26/2018     HCT 34.0 (L) 12/26/2018   MCV 77.4 (L) 12/26/2018   PLT 433.0 (H) 12/26/2018   Lab Results  Component Value Date   NA 138 12/26/2018   K 3.7 12/26/2018   CO2 27 12/26/2018   GLUCOSE 92 12/26/2018   BUN 8 12/26/2018   CREATININE 0.73 12/26/2018   BILITOT 0.3 12/26/2018   ALKPHOS 67 12/26/2018   AST 11 12/26/2018   ALT 13 12/26/2018   PROT 6.5 12/26/2018   ALBUMIN 3.3 (L) 12/26/2018   CALCIUM 8.7 12/26/2018   ANIONGAP 7 04/18/2015   GFR 92.79 12/26/2018   Lab Results  Component Value Date   CHOL 139 08/24/2017   Lab Results  Component Value Date   HDL 61.80 08/24/2017   Lab Results  Component Value Date   LDLCALC 65 08/24/2017   Lab Results  Component Value Date   TRIG 64.0 08/24/2017   Lab Results  Component Value Date   CHOLHDL 2 08/24/2017   No results found for: HGBA1C     Assessment & Plan:   Problem List Items Addressed This Visit    Other iron deficiency anemia (Chronic)    Occurred after pregnacy and delivery has improved some but still present. Will discuss with hematology, due to her Crohns she does not tolerate oral iron. Increase leafy greens, consider increased lean red meat and using cast iron cookware. Continue to monitor, report any concerns.      Preventative health care - Primary    Patient encouraged to maintain heart healthy diet, regular exercise, adequate sleep. Consider daily probiotics. Take medications as prescribed. Labs ordered. Had her first kid this year in July and is doing very well.       Crohn's disease ([place])  Doing well on Humira       Eczema    Has trouble with rash behind her ears and has seen dermatology in the past, has responded to Mupirocin before. Is given refill if does not improve will let us or derm know         I am having Shereka B. Yolanda Bonine "Astin Rape" start on mupirocin ointment. I am also having her maintain her Misc Intestinal Flora Regulat, SYRINGE-NEEDLE (DISP) 3 ML, triamcinolone ointment,  vitamin B-12, acetaminophen, ibuprofen, Humira Pen, and Humira Pen.  Meds ordered this encounter  Medications  . mupirocin ointment (BACTROBAN) 2 %    Sig: Place 1 application into the nose 2 (two) times daily.    Dispense:  22 g    Refill:  5     I discussed the assessment and treatment plan with the patient. The patient was provided an opportunity to ask questions and all were answered. The patient agreed with the plan and demonstrated an understanding of the instructions.   The patient was advised to call back or seek an in-person evaluation if the symptoms worsen or if the condition fails to improve as anticipated.  I provided 30 minutes of non-face-to-face time during this encounter.   Penni Homans, MD

## 2019-01-14 NOTE — Assessment & Plan Note (Signed)
Patient encouraged to maintain heart healthy diet, regular exercise, adequate sleep. Consider daily probiotics. Take medications as prescribed. Labs ordered. Had her first kid this year in July and is doing very well.

## 2019-01-14 NOTE — Assessment & Plan Note (Signed)
Has trouble with rash behind her ears and has seen dermatology in the past, has responded to Mupirocin before. Is given refill if does not improve will let us or derm know

## 2019-01-14 NOTE — Assessment & Plan Note (Addendum)
Occurred after pregnacy and delivery has improved some but still present. Will discuss with hematology, due to her Crohns she does not tolerate oral iron. Increase leafy greens, consider increased lean red meat and using cast iron cookware. Continue to monitor, report any concerns.

## 2019-01-15 ENCOUNTER — Other Ambulatory Visit: Payer: Self-pay | Admitting: Family Medicine

## 2019-01-15 DIAGNOSIS — D509 Iron deficiency anemia, unspecified: Secondary | ICD-10-CM

## 2019-01-15 NOTE — Progress Notes (Unsigned)
amb  

## 2019-01-16 ENCOUNTER — Other Ambulatory Visit: Payer: Self-pay | Admitting: Family

## 2019-01-22 ENCOUNTER — Inpatient Hospital Stay: Payer: Commercial Managed Care - PPO | Attending: Family | Admitting: Family

## 2019-01-22 ENCOUNTER — Other Ambulatory Visit: Payer: Self-pay

## 2019-01-22 ENCOUNTER — Other Ambulatory Visit: Payer: Self-pay | Admitting: Family

## 2019-01-22 ENCOUNTER — Encounter: Payer: Self-pay | Admitting: Family

## 2019-01-22 ENCOUNTER — Inpatient Hospital Stay: Payer: Commercial Managed Care - PPO

## 2019-01-22 VITALS — BP 101/70 | HR 86 | Temp 97.1°F | Resp 18 | Ht 63.0 in | Wt 128.8 lb

## 2019-01-22 VITALS — BP 107/79 | HR 73 | Resp 17

## 2019-01-22 DIAGNOSIS — D508 Other iron deficiency anemias: Secondary | ICD-10-CM

## 2019-01-22 DIAGNOSIS — K5090 Crohn's disease, unspecified, without complications: Secondary | ICD-10-CM | POA: Insufficient documentation

## 2019-01-22 DIAGNOSIS — D500 Iron deficiency anemia secondary to blood loss (chronic): Secondary | ICD-10-CM

## 2019-01-22 DIAGNOSIS — K909 Intestinal malabsorption, unspecified: Secondary | ICD-10-CM

## 2019-01-22 MED ORDER — SODIUM CHLORIDE 0.9 % IV SOLN
Freq: Once | INTRAVENOUS | Status: AC
  Filled 2019-01-22: qty 250

## 2019-01-22 MED ORDER — SODIUM CHLORIDE 0.9 % IV SOLN
200.0000 mg | Freq: Once | INTRAVENOUS | Status: AC
  Administered 2019-01-22: 200 mg via INTRAVENOUS
  Filled 2019-01-22: qty 290.91

## 2019-01-22 NOTE — Patient Instructions (Signed)

## 2019-01-22 NOTE — Progress Notes (Addendum)
Hematology and Oncology Follow Up Visit  Carla Clark [id number] 12-Nov-1985 33 y.o. 01/22/2019   Principle Diagnosis:  Iron deficiency anemia secondary to chronic blood loss due to Crohn's disease  Current Therapy:   IV iron as indicated    Interim History:  Carla Clark is here today to re-establish care for iron deficiency anemia. She gave birth to a precious baby boy in July and they are both doing well.  She has had some issues with her Crohn's. She has noted a little diarrhea, abdominal pain and some scant blood mixed in with her stool.  She states that the blood loss has not been significant and has not noted any other blood loss. No bruising or petechiae.  She follows up with Gi Dr. Hilarie Fredrickson in January.  She has started taking a probiotic and is avoiding foods that are triggers for a flare. Iron saturation earlier this month was 4% and ferritin 15.  She has some fatigue at times.  No fever, chills, n/v, cough, rash, dizziness, SOB, chest pain, palpitations or changes in bladder habits.  No swelling, tenderness, numbness or tingling in her extremities at this time.  No falls or syncopal episodes to report.  She is eating well but admits that she could better hydrate throughout the day. Her weight is stable.  She has almost stopped breast feeding.  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   ECOG Performance Status: 1 - Symptomatic but completely ambulatory  Medications:  Allergies as of 01/22/2019   No Known Allergies     Medication List       Accurate as of January 22, 2019 10:58 AM. If you have any questions, ask your nurse or doctor.        acetaminophen 325 MG tablet Commonly known as: Tylenol Take 2 tablets (650 mg total) by mouth every 4 (four) hours as needed (for pain scale < 4).   Humira Pen 40 MG/0.4ML Pnkt Generic drug: Adalimumab Inject 40 mg into the skin every 14 (fourteen) days.   Humira Pen 40 MG/0.4ML Pnkt Generic drug:  Adalimumab Inject 40 mg into the skin every 14 (fourteen) days.   ibuprofen 600 MG tablet Commonly known as: ADVIL Take 1 tablet (600 mg total) by mouth every 6 (six) hours.   Misc Intestinal Flora Regulat Chew Chew by mouth every other day. Digestive Health probiotic gummies by Schiff   mupirocin ointment 2 % Commonly known as: Bactroban Place 1 application into the nose 2 (two) times daily.   SYRINGE-NEEDLE (DISP) 3 ML 25G X 5/8" 3 ML Misc Use as directed for B12 injections.   triamcinolone ointment 0.1 % Commonly known as: KENALOG daily as needed.   vitamin B-12 100 MCG tablet Commonly known as: CYANOCOBALAMIN Take 100 mcg by mouth daily.       Allergies: No Known Allergies  Past Medical History, Surgical history, Social history, and Family History were reviewed and updated.  Review of Systems: All other 10 point review of systems is negative.   Physical Exam:  vitals were not taken for this visit.   Wt Readings from Last 3 Encounters:  01/14/19 130 lb (59 kg)  08/12/18 150 lb 1.6 oz (68.1 kg)  01/04/18 131 lb (59.4 kg)    Ocular: Sclerae unicteric, pupils equal, round and reactive to light Ear-nose-throat: Oropharynx clear, dentition fair Lymphatic: No cervical or supraclavicular adenopathy Lungs no rales or rhonchi, good excursion bilaterally Heart regular rate and rhythm, no murmur appreciated Abd  soft, nontender, positive bowel sounds, no liver or spleen tip palpated on exam, no fluid wave  MSK no focal spinal tenderness, no joint edema Neuro: non-focal, well-oriented, appropriate affect Breasts: Deferred   Lab Results  Component Value Date   WBC 7.2 12/26/2018   HGB 10.6 Repeated and verified X2. (L) 12/26/2018   HCT 34.0 (L) 12/26/2018   MCV 77.4 (L) 12/26/2018   PLT 433.0 (H) 12/26/2018   Lab Results  Component Value Date   FERRITIN 15.2 12/27/2018   IRON 14 (L) 12/27/2018   TIBC 334 05/12/2017   UIBC 245 05/12/2017   IRONPCTSAT 4.1 (L)  12/27/2018   Lab Results  Component Value Date   RETICCTPCT 0.6 (L) 05/12/2017   RBC 4.39 12/26/2018   RETICCTABS 13.7 (L) 01/20/2015   No results found for: KPAFRELGTCHN, LAMBDASER, [facility] Lab Results  Component Value Date   IGA 187 03/11/2016   No results found for: [name], SPEI   Chemistry      Component Value Date/Time   NA 138 12/26/2018 1610   K 3.7 12/26/2018 1610   CL 105 12/26/2018 1610   CO2 27 12/26/2018 1610   BUN 8 12/26/2018 1610   CREATININE 0.73 12/26/2018 1610   CREATININE 0.62 08/20/2012 1139      Component Value Date/Time   CALCIUM 8.7 12/26/2018 1610   ALKPHOS 67 12/26/2018 1610   AST 11 12/26/2018 1610   ALT 13 12/26/2018 1610   BILITOT 0.3 12/26/2018 1610       Impression and Plan: Carla Clark is a very pleasant 33 yo caucasian female with history of iron deficiency anemia secondary to 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   We will proceed with IV iron today and 4 more doses of the next 4 weeks.  We will see her back for follow-up in 8 weeks.  She will contact our office with any questions or concerns. We can certainly see her sooner if needed.   [name], NP 12/29/202010:58 AM

## 2019-01-28 ENCOUNTER — Inpatient Hospital Stay: Payer: Commercial Managed Care - PPO | Attending: Family

## 2019-01-28 ENCOUNTER — Other Ambulatory Visit: Payer: Self-pay

## 2019-01-28 VITALS — BP 117/63 | HR 87 | Temp 97.1°F | Resp 17

## 2019-01-28 DIAGNOSIS — D500 Iron deficiency anemia secondary to blood loss (chronic): Secondary | ICD-10-CM | POA: Diagnosis not present

## 2019-01-28 DIAGNOSIS — D508 Other iron deficiency anemias: Secondary | ICD-10-CM

## 2019-01-28 DIAGNOSIS — K5090 Crohn's disease, unspecified, without complications: Secondary | ICD-10-CM | POA: Insufficient documentation

## 2019-01-28 MED ORDER — SODIUM CHLORIDE 0.9 % IV SOLN
200.0000 mg | Freq: Once | INTRAVENOUS | Status: AC
  Administered 2019-01-28: 13:00:00 200 mg via INTRAVENOUS
  Filled 2019-01-28: qty 10

## 2019-01-28 MED ORDER — SODIUM CHLORIDE 0.9 % IV SOLN
Freq: Once | INTRAVENOUS | Status: AC
  Filled 2019-01-28: qty 250

## 2019-01-28 NOTE — Patient Instructions (Signed)

## 2019-01-29 ENCOUNTER — Inpatient Hospital Stay: Payer: Commercial Managed Care - PPO

## 2019-01-31 ENCOUNTER — Telehealth: Payer: Self-pay | Admitting: Hematology & Oncology

## 2019-01-31 NOTE — Telephone Encounter (Signed)
Called and LMVM for patient regarding moving infusion appt for 1/11 to 1/14 due to CAP in infusion.  I asked that she call back and confirm this would be ok.Carla Clark

## 2019-02-04 ENCOUNTER — Inpatient Hospital Stay: Payer: Commercial Managed Care - PPO

## 2019-02-05 ENCOUNTER — Ambulatory Visit: Payer: Commercial Managed Care - PPO

## 2019-02-06 ENCOUNTER — Telehealth: Payer: Self-pay | Admitting: Family Medicine

## 2019-02-06 NOTE — Telephone Encounter (Signed)
LM to schedule

## 2019-02-08 ENCOUNTER — Inpatient Hospital Stay: Payer: Commercial Managed Care - PPO

## 2019-02-08 ENCOUNTER — Other Ambulatory Visit: Payer: Self-pay

## 2019-02-08 VITALS — BP 101/62 | HR 66 | Temp 97.5°F | Resp 18

## 2019-02-08 DIAGNOSIS — D508 Other iron deficiency anemias: Secondary | ICD-10-CM

## 2019-02-08 DIAGNOSIS — D500 Iron deficiency anemia secondary to blood loss (chronic): Secondary | ICD-10-CM | POA: Diagnosis not present

## 2019-02-08 MED ORDER — SODIUM CHLORIDE 0.9 % IV SOLN
Freq: Once | INTRAVENOUS | Status: AC
  Filled 2019-02-08: qty 250

## 2019-02-08 MED ORDER — SODIUM CHLORIDE 0.9 % IV SOLN
200.0000 mg | Freq: Once | INTRAVENOUS | Status: AC
  Administered 2019-02-08: 200 mg via INTRAVENOUS
  Filled 2019-02-08: qty 10

## 2019-02-08 NOTE — Patient Instructions (Signed)

## 2019-02-11 ENCOUNTER — Inpatient Hospital Stay: Payer: Commercial Managed Care - PPO

## 2019-02-11 ENCOUNTER — Other Ambulatory Visit: Payer: Self-pay

## 2019-02-11 VITALS — BP 112/73 | HR 73 | Temp 97.9°F | Resp 18

## 2019-02-11 DIAGNOSIS — D500 Iron deficiency anemia secondary to blood loss (chronic): Secondary | ICD-10-CM | POA: Diagnosis not present

## 2019-02-11 DIAGNOSIS — D508 Other iron deficiency anemias: Secondary | ICD-10-CM

## 2019-02-11 MED ORDER — SODIUM CHLORIDE 0.9 % IV SOLN
Freq: Once | INTRAVENOUS | Status: AC
  Filled 2019-02-11: qty 250

## 2019-02-11 MED ORDER — SODIUM CHLORIDE 0.9 % IV SOLN
200.0000 mg | Freq: Once | INTRAVENOUS | Status: AC
  Administered 2019-02-11: 200 mg via INTRAVENOUS
  Filled 2019-02-11: qty 10

## 2019-02-11 NOTE — Telephone Encounter (Signed)
LM to schedule 1. Lab visit w/in the next 2 weeks 2. F/u after 06/14/2019 with Dr. Charlett Blake

## 2019-02-11 NOTE — Patient Instructions (Signed)

## 2019-02-12 ENCOUNTER — Ambulatory Visit: Payer: Commercial Managed Care - PPO

## 2019-02-13 ENCOUNTER — Encounter: Payer: Self-pay | Admitting: Internal Medicine

## 2019-02-13 ENCOUNTER — Ambulatory Visit: Payer: Commercial Managed Care - PPO | Admitting: Internal Medicine

## 2019-02-13 VITALS — BP 100/70 | HR 64 | Temp 97.8°F | Ht 63.0 in | Wt 129.0 lb

## 2019-02-13 DIAGNOSIS — Z860100 Personal history of colon polyps, unspecified: Secondary | ICD-10-CM

## 2019-02-13 DIAGNOSIS — D500 Iron deficiency anemia secondary to blood loss (chronic): Secondary | ICD-10-CM | POA: Diagnosis not present

## 2019-02-13 DIAGNOSIS — K5000 Crohn's disease of small intestine without complications: Secondary | ICD-10-CM | POA: Diagnosis not present

## 2019-02-13 NOTE — Patient Instructions (Signed)
If you are age 32 or older, your body mass index should be between 23-30. Your Body mass index is 22.85 kg/m. If this is out of the aforementioned range listed, please consider follow up with your Primary Care Provider.  If you are age 76 or younger, your body mass index should be between 19-25. Your Body mass index is 22.85 kg/m. If this is out of the aformentioned range listed, please consider follow up with your Primary Care Provider.   You have been scheduled for an MRI enterography at [facility]  on 02-22-2019. Your appointment time is 4. Please arrive 60 minutes prior to your appointment time for registration purposes. Please make certain not to have anything to eat or drink 4 hours prior to your test. In addition, if you have any metal in your body, have a pacemaker or defibrillator, please be sure to let your ordering physician know. This test typically takes 45 minutes to 1 hour to complete. Should you need to reschedule, please call [phone] to do so.  Start oral B12 1,065mg one a day.  Continue Humira every 2 weeks.  Ask your Hematologist to order a B12 level at your next visit.  It was a pleasure to see you today!  Dr. PHilarie Fredrickson

## 2019-02-13 NOTE — Progress Notes (Signed)
Subjective:    Patient ID: Carla Clark, female    DOB: 1986/01/09, 34 y.o.   MRN: [id number]  HPI Carla Clark is a 34 yo female with PMH of ileal Crohn's, IDA related to IBD, prior SBO related to Crohn's, b12 deficiency and hx of anal fissure who is here for followup.  She was last seen on 08/20/2016 and is here alone today.  Steele reports that she has been doing well.  She continues on Humira 40 mg every 14 days.  She has not been as adherent to her B12 supplementation.  She is taking a daily probiotic.  She has been receiving IV iron infusions and has had for 5 doses recently.  She can develop right-sided abdominal pain which seems to be transient.  She feels that this is dietary in nature.  No obstructive symptoms that last any significant length of time.  Stools are mostly formed but can be loose.  She will have loose stools 1-2 times per week.  Bowel movements are once a day.  No blood in her bowel movements or melena.  Occasionally scant blood with wiping.  No issues with Humira injections.  Since her last visit she went through a successful pregnancy and delivered a baby boy weighing 4 pounds 14 ounces.  She had a second-degree tear which was repaired.  He is doing well and is now 51 months old after being born in July 2020.  She took Humira uninterrupted throughout pregnancy.   Review of Systems As per HPI, otherwise negative  Current Medications, Allergies, Past Medical History, Past Surgical History, Family History and Social History were reviewed in [employer] record.     Objective:   Physical Exam BP 100/70   Pulse 64   Temp 97.8 F (36.6 C)   Ht 5' 3"  (1.6 m)   Wt 129 lb (58.5 kg)   LMP 02/13/2019 (Exact Date)   BMI 22.85 kg/m  Gen: awake, alert, NAD HEENT: anicteric, op clear CV: RRR, no mrg Pulm: CTA b/l Abd: soft, right-sided abdominal tenderness which is mild without rebound, nondistended, +BS throughout Ext: no c/c/e Neuro:  nonfocal  CBC    Component Value Date/Time   WBC 7.2 12/26/2018 1610   RBC 4.39 12/26/2018 1610   HGB 10.6 Repeated and verified X2. (L) 12/26/2018 1610   HGB 13.6 05/12/2017 0916   HGB 12.0 01/11/2017 1514   HCT 34.0 (L) 12/26/2018 1610   HCT 36.7 01/11/2017 1514   PLT 433.0 (H) 12/26/2018 1610   PLT 320 05/12/2017 0916   PLT 313 01/11/2017 1514   MCV 77.4 (L) 12/26/2018 1610   MCV 89 01/11/2017 1514   MCH 26.3 08/13/2018 0403   MCHC 31.3 12/26/2018 1610   RDW 14.8 12/26/2018 1610   RDW 12.7 01/11/2017 1514   LYMPHSABS 2.7 12/26/2018 1610   LYMPHSABS 2.9 01/11/2017 1514   MONOABS 0.9 12/26/2018 1610   EOSABS 0.2 12/26/2018 1610   EOSABS 0.2 01/11/2017 1514   BASOSABS 0.1 12/26/2018 1610   BASOSABS 0.0 01/11/2017 1514   Iron/TIBC/Ferritin/ %Sat    Component Value Date/Time   IRON 14 (L) 12/27/2018 1037   IRON 70 01/11/2017 1514   TIBC 334 05/12/2017 0917   TIBC 299 01/11/2017 1514   FERRITIN 15.2 12/27/2018 1037   FERRITIN 25 01/11/2017 1514   IRONPCTSAT 4.1 (L) 12/27/2018 1037   IRONPCTSAT 23 01/11/2017 1514   CMP     Component Value Date/Time   NA 138 12/26/2018 1610  K 3.7 12/26/2018 1610   CL 105 12/26/2018 1610   CO2 27 12/26/2018 1610   GLUCOSE 92 12/26/2018 1610   BUN 8 12/26/2018 1610   CREATININE 0.73 12/26/2018 1610   CREATININE 0.62 08/20/2012 1139   CALCIUM 8.7 12/26/2018 1610   PROT 6.5 12/26/2018 1610   ALBUMIN 3.3 (L) 12/26/2018 1610   AST 11 12/26/2018 1610   ALT 13 12/26/2018 1610   ALKPHOS 67 12/26/2018 1610   BILITOT 0.3 12/26/2018 1610   GFRNONAA >60 04/18/2015 0433   GFRAA >60 04/18/2015 0433   Iron/TIBC/Ferritin/ %Sat    Component Value Date/Time   IRON 14 (L) 12/27/2018 1037   IRON 70 01/11/2017 1514   TIBC 334 05/12/2017 0917   TIBC 299 01/11/2017 1514   FERRITIN 15.2 12/27/2018 1037   FERRITIN 25 01/11/2017 1514   IRONPCTSAT 4.1 (L) 12/27/2018 1037   IRONPCTSAT 23 01/11/2017 1514        Assessment & Plan:  34 yo  female with PMH of ileal Crohn's, IDA related to IBD, prior SBO related to Crohn's, b12 deficiency and hx of anal fissure who is here for followup.   1.  Ileal Crohn's --I am suspicious that her ileal Crohn's may not be in remission as she is tender on exam and still has a significant iron deficiency anemia.  She has been consistent with Humira 40 mg every 14 days.  I recommended that we continue Humira therapy and perform MR enterography --Continue Humira 40 mg every 14 days --Recent lab work and QuantiFERON gold noted, TB was negative --MR enterography to assess ileal Crohn's disease activity  2.  History of sessile serrated polyp --surveillance colonoscopy is recommended in summer 2021.  She is aware of this recommendation  3.  B12 deficiency --he she has been off of B12 recently and I encouraged her to resume at least oral B12 1000 mcg daily.  She should have her B12 level checked at her next lab draw  30 minutes total spent today including patient facing time, coordination of care, reviewing medical history/procedures/pertinent radiology studies, and documentation of the encounter.

## 2019-02-18 ENCOUNTER — Other Ambulatory Visit: Payer: Self-pay

## 2019-02-18 ENCOUNTER — Inpatient Hospital Stay: Payer: Commercial Managed Care - PPO

## 2019-02-18 VITALS — BP 106/54 | HR 79 | Temp 97.1°F

## 2019-02-18 DIAGNOSIS — D508 Other iron deficiency anemias: Secondary | ICD-10-CM

## 2019-02-18 DIAGNOSIS — D500 Iron deficiency anemia secondary to blood loss (chronic): Secondary | ICD-10-CM | POA: Diagnosis not present

## 2019-02-18 MED ORDER — SODIUM CHLORIDE 0.9 % IV SOLN
200.0000 mg | Freq: Once | INTRAVENOUS | Status: AC
  Administered 2019-02-18: 13:00:00 200 mg via INTRAVENOUS
  Filled 2019-02-18: qty 10

## 2019-02-18 MED ORDER — SODIUM CHLORIDE 0.9 % IV SOLN
Freq: Once | INTRAVENOUS | Status: AC
  Filled 2019-02-18: qty 250

## 2019-02-18 NOTE — Patient Instructions (Signed)

## 2019-02-19 ENCOUNTER — Ambulatory Visit: Payer: Commercial Managed Care - PPO

## 2019-02-22 ENCOUNTER — Ambulatory Visit (HOSPITAL_COMMUNITY): Payer: Commercial Managed Care - PPO

## 2019-03-18 ENCOUNTER — Inpatient Hospital Stay: Payer: Commercial Managed Care - PPO | Attending: Family

## 2019-03-18 ENCOUNTER — Inpatient Hospital Stay (HOSPITAL_BASED_OUTPATIENT_CLINIC_OR_DEPARTMENT_OTHER): Payer: Commercial Managed Care - PPO | Admitting: Family

## 2019-03-18 ENCOUNTER — Other Ambulatory Visit: Payer: Self-pay

## 2019-03-18 ENCOUNTER — Encounter: Payer: Self-pay | Admitting: Family

## 2019-03-18 VITALS — BP 108/71 | HR 98 | Temp 97.1°F | Resp 18 | Ht 63.0 in | Wt 127.8 lb

## 2019-03-18 DIAGNOSIS — D500 Iron deficiency anemia secondary to blood loss (chronic): Secondary | ICD-10-CM

## 2019-03-18 DIAGNOSIS — K5090 Crohn's disease, unspecified, without complications: Secondary | ICD-10-CM | POA: Insufficient documentation

## 2019-03-18 DIAGNOSIS — K900 Celiac disease: Secondary | ICD-10-CM | POA: Diagnosis not present

## 2019-03-18 DIAGNOSIS — K909 Intestinal malabsorption, unspecified: Secondary | ICD-10-CM | POA: Diagnosis not present

## 2019-03-18 DIAGNOSIS — D508 Other iron deficiency anemias: Secondary | ICD-10-CM | POA: Insufficient documentation

## 2019-03-18 LAB — CBC WITH DIFFERENTIAL (CANCER CENTER ONLY)
Abs Immature Granulocytes: 0.06 10*3/uL (ref 0.00–0.07)
Basophils Absolute: 0.1 10*3/uL (ref 0.0–0.1)
Basophils Relative: 1 %
Eosinophils Absolute: 0.2 10*3/uL (ref 0.0–0.5)
Eosinophils Relative: 3 %
HCT: 38.9 % (ref 36.0–46.0)
Hemoglobin: 11.8 g/dL — ABNORMAL LOW (ref 12.0–15.0)
Immature Granulocytes: 1 %
Lymphocytes Relative: 34 %
Lymphs Abs: 2.6 10*3/uL (ref 0.7–4.0)
MCH: 24.6 pg — ABNORMAL LOW (ref 26.0–34.0)
MCHC: 30.3 g/dL (ref 30.0–36.0)
MCV: 81.2 fL (ref 80.0–100.0)
Monocytes Absolute: 0.7 10*3/uL (ref 0.1–1.0)
Monocytes Relative: 9 %
Neutro Abs: 4.2 10*3/uL (ref 1.7–7.7)
Neutrophils Relative %: 52 %
Platelet Count: 410 10*3/uL — ABNORMAL HIGH (ref 150–400)
RBC: 4.79 MIL/uL (ref 3.87–5.11)
RDW: 17 % — ABNORMAL HIGH (ref 11.5–15.5)
WBC Count: 7.8 10*3/uL (ref 4.0–10.5)
nRBC: 0 % (ref 0.0–0.2)

## 2019-03-18 LAB — RETICULOCYTES
Immature Retic Fract: 4.4 % (ref 2.3–15.9)
RBC.: 4.73 MIL/uL (ref 3.87–5.11)
Retic Count, Absolute: 35.9 10*3/uL (ref 19.0–186.0)
Retic Ct Pct: 0.8 % (ref 0.4–3.1)

## 2019-03-18 NOTE — Progress Notes (Signed)
Hematology and Oncology Follow Up Visit  Carla Clark [id number] 09-11-1985 33 y.o. 03/18/2019   Principle Diagnosis:  Iron deficiency anemia secondary to chronic blood loss due to Crohn's disease  Current Therapy:   IV iron as indicated    Interim History:  Carla Clark is here today for follow-up. She is currently on her cycle. Her cycle returned in January (post pregnancy and breast feeding). Her cycle flow is described as normal, not heavy.  No other blood loss noted. No bruising or petechiae.  She has responded nicely to the IV iron she received last month. Hgb is 11.8, MCV 81 and platelet count 410.  Her GI flares with Crohn's seem much better.  No fever, chills, n/v, cough, rash, dizziness, SOB, chest pain, palpitations, abdominal pain or changes in bladder habits.  No swelling, tenderness, numbness or tingling in her extremities.  No falls or syncope.  She has maintained a good appetite and is staying well hydrated. Her weight is stable.   ECOG Performance Status: 1 - Symptomatic but completely ambulatory  Medications:  Allergies as of 03/18/2019   No Known Allergies     Medication List       Accurate as of March 18, 2019  1:04 PM. If you have any questions, ask your nurse or doctor.        Humira Pen 40 MG/0.4ML Pnkt Generic drug: Adalimumab Inject 40 mg into the skin every 14 (fourteen) days.   Misc Intestinal Flora Regulat Chew Chew by mouth every other day. Digestive Health probiotic gummies by Schiff   mupirocin ointment 2 % Commonly known as: Bactroban Place 1 application into the nose 2 (two) times daily.   SYRINGE-NEEDLE (DISP) 3 ML 25G X 5/8" 3 ML Misc Use as directed for B12 injections.   vitamin B-12 100 MCG tablet Commonly known as: CYANOCOBALAMIN Take 100 mcg by mouth daily.       Allergies: No Known Allergies  Past Medical History, Surgical history, Social history, and Family History were reviewed and updated.  Review of  Systems: All other 10 point review of systems is negative.   Physical Exam:  vitals were not taken for this visit.   Wt Readings from Last 3 Encounters:  02/13/19 129 lb (58.5 kg)  01/22/19 128 lb 12.8 oz (58.4 kg)  01/14/19 130 lb (59 kg)    Ocular: Sclerae unicteric, pupils equal, round and reactive to light Ear-nose-throat: Oropharynx clear, dentition fair Lymphatic: No cervical or supraclavicular adenopathy Lungs no rales or rhonchi, good excursion bilaterally Heart regular rate and rhythm, no murmur appreciated Abd soft, nontender, positive bowel sounds, no liver or spleen tip palpated on exam, no fluid wave  MSK no focal spinal tenderness, no joint edema Neuro: non-focal, well-oriented, appropriate affect Breasts: Deferred   Lab Results  Component Value Date   WBC 7.2 12/26/2018   HGB 10.6 Repeated and verified X2. (L) 12/26/2018   HCT 34.0 (L) 12/26/2018   MCV 77.4 (L) 12/26/2018   PLT 433.0 (H) 12/26/2018   Lab Results  Component Value Date   FERRITIN 15.2 12/27/2018   IRON 14 (L) 12/27/2018   TIBC 334 05/12/2017   UIBC 245 05/12/2017   IRONPCTSAT 4.1 (L) 12/27/2018   Lab Results  Component Value Date   RETICCTPCT 0.6 (L) 05/12/2017   RBC 4.39 12/26/2018   RETICCTABS 13.7 (L) 01/20/2015   No results found for: KPAFRELGTCHN, LAMBDASER, [facility] Lab Results  Component Value Date   IGA 187 03/11/2016   No results  found for: [name], SPEI   Chemistry      Component Value Date/Time   NA 138 12/26/2018 1610   K 3.7 12/26/2018 1610   CL 105 12/26/2018 1610   CO2 27 12/26/2018 1610   BUN 8 12/26/2018 1610   CREATININE 0.73 12/26/2018 1610   CREATININE 0.62 08/20/2012 1139      Component Value Date/Time   CALCIUM 8.7 12/26/2018 1610   ALKPHOS 67 12/26/2018 1610   AST 11 12/26/2018 1610   ALT 13 12/26/2018 1610   BILITOT 0.3 12/26/2018 1610       Impression and Plan: Carla Clark is a  very pleasant 34 yo caucasian female with history of iron deficiency anemia secondary to 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  Iron studies are pending. We will replace if needed.  We will plan to see her back in another 3 months.  She will contact our office with any questions or concerns. We can certainly see her sooner if needed.   [name], NP 2/22/20211:04 PM

## 2019-03-19 ENCOUNTER — Ambulatory Visit: Payer: Commercial Managed Care - PPO | Admitting: Family

## 2019-03-19 ENCOUNTER — Other Ambulatory Visit: Payer: Commercial Managed Care - PPO

## 2019-03-19 LAB — FERRITIN: Ferritin: 171 ng/mL (ref 11–307)

## 2019-03-19 LAB — IRON AND TIBC
Iron: 20 ug/dL — ABNORMAL LOW (ref 41–142)
Saturation Ratios: 7 % — ABNORMAL LOW (ref 21–57)
TIBC: 268 ug/dL (ref 236–444)
UIBC: 248 ug/dL (ref 120–384)

## 2019-03-20 ENCOUNTER — Telehealth: Payer: Self-pay | Admitting: Hematology & Oncology

## 2019-03-26 ENCOUNTER — Inpatient Hospital Stay: Payer: Commercial Managed Care - PPO

## 2019-03-29 ENCOUNTER — Inpatient Hospital Stay: Payer: Commercial Managed Care - PPO

## 2019-04-03 ENCOUNTER — Inpatient Hospital Stay: Payer: Commercial Managed Care - PPO

## 2019-04-04 ENCOUNTER — Ambulatory Visit (HOSPITAL_COMMUNITY)
Admission: RE | Admit: 2019-04-04 | Discharge: 2019-04-04 | Disposition: A | Discharge status: Home / Self Care | Payer: Commercial Managed Care - PPO | Source: Ambulatory Visit | Attending: Internal Medicine | Admitting: Internal Medicine

## 2019-04-04 ENCOUNTER — Other Ambulatory Visit: Payer: Self-pay

## 2019-04-04 DIAGNOSIS — D500 Iron deficiency anemia secondary to blood loss (chronic): Secondary | ICD-10-CM

## 2019-04-04 DIAGNOSIS — K5000 Crohn's disease of small intestine without complications: Secondary | ICD-10-CM

## 2019-04-04 MED ORDER — GADOBUTROL 1 MMOL/ML IV SOLN
6.0000 mL | Freq: Once | INTRAVENOUS | Status: AC | PRN
  Administered 2019-04-04: 6 mL via INTRAVENOUS

## 2019-04-22 ENCOUNTER — Inpatient Hospital Stay: Payer: Commercial Managed Care - PPO | Attending: Family

## 2019-04-22 ENCOUNTER — Other Ambulatory Visit: Payer: Self-pay

## 2019-04-22 VITALS — BP 132/97 | HR 70 | Temp 97.5°F | Resp 17

## 2019-04-22 DIAGNOSIS — K5090 Crohn's disease, unspecified, without complications: Secondary | ICD-10-CM | POA: Insufficient documentation

## 2019-04-22 DIAGNOSIS — D508 Other iron deficiency anemias: Secondary | ICD-10-CM

## 2019-04-22 DIAGNOSIS — D500 Iron deficiency anemia secondary to blood loss (chronic): Secondary | ICD-10-CM | POA: Diagnosis not present

## 2019-04-22 MED ORDER — SODIUM CHLORIDE 0.9 % IV SOLN
200.0000 mg | Freq: Once | INTRAVENOUS | Status: AC
  Administered 2019-04-22: 200 mg via INTRAVENOUS
  Filled 2019-04-22: qty 200

## 2019-04-22 MED ORDER — SODIUM CHLORIDE 0.9 % IV SOLN
INTRAVENOUS | Status: DC
  Filled 2019-04-22: qty 250

## 2019-04-22 NOTE — Patient Instructions (Signed)

## 2019-04-29 ENCOUNTER — Inpatient Hospital Stay: Payer: Commercial Managed Care - PPO | Attending: Family

## 2019-04-29 ENCOUNTER — Other Ambulatory Visit: Payer: Self-pay

## 2019-04-29 VITALS — BP 102/64 | HR 77 | Resp 17

## 2019-04-29 DIAGNOSIS — D508 Other iron deficiency anemias: Secondary | ICD-10-CM

## 2019-04-29 DIAGNOSIS — K5090 Crohn's disease, unspecified, without complications: Secondary | ICD-10-CM | POA: Diagnosis present

## 2019-04-29 DIAGNOSIS — D500 Iron deficiency anemia secondary to blood loss (chronic): Secondary | ICD-10-CM | POA: Insufficient documentation

## 2019-04-29 MED ORDER — SODIUM CHLORIDE 0.9 % IV SOLN
INTRAVENOUS | Status: DC
  Filled 2019-04-29: qty 250

## 2019-04-29 MED ORDER — SODIUM CHLORIDE 0.9 % IV SOLN
200.0000 mg | Freq: Once | INTRAVENOUS | Status: AC
  Administered 2019-04-29: 200 mg via INTRAVENOUS
  Filled 2019-04-29: qty 200

## 2019-04-29 NOTE — Progress Notes (Signed)
Pharmacist Chemotherapy Monitoring - Follow Up Assessment    I verify that I have reviewed each item in the below checklist:  . Regimen for the patient is scheduled for the appropriate day and plan matches scheduled date. Marland Kitchen Appropriate non-routine labs are ordered dependent on drug ordered. . If applicable, additional medications reviewed and ordered per protocol based on lifetime cumulative doses and/or treatment regimen.   Plan for follow-up and/or issues identified: No . I-vent associated with next due treatment: No . MD and/or nursing notified: No  Jojo Geving, Jacqlyn Larsen 04/29/2019 11:51 AM

## 2019-04-29 NOTE — Patient Instructions (Signed)

## 2019-05-06 ENCOUNTER — Inpatient Hospital Stay: Payer: Commercial Managed Care - PPO

## 2019-05-06 ENCOUNTER — Other Ambulatory Visit: Payer: Self-pay

## 2019-05-06 VITALS — BP 112/68 | HR 72 | Temp 97.5°F | Resp 18

## 2019-05-06 DIAGNOSIS — D508 Other iron deficiency anemias: Secondary | ICD-10-CM

## 2019-05-06 DIAGNOSIS — D500 Iron deficiency anemia secondary to blood loss (chronic): Secondary | ICD-10-CM | POA: Diagnosis not present

## 2019-05-06 MED ORDER — SODIUM CHLORIDE 0.9 % IV SOLN
Freq: Once | INTRAVENOUS | Status: AC
  Filled 2019-05-06: qty 250

## 2019-05-06 MED ORDER — SODIUM CHLORIDE 0.9 % IV SOLN
200.0000 mg | Freq: Once | INTRAVENOUS | Status: AC
  Administered 2019-05-06: 200 mg via INTRAVENOUS
  Filled 2019-05-06: qty 200

## 2019-05-06 NOTE — Progress Notes (Signed)
Patient did not want to wait for 30 min observation time.  VSS

## 2019-05-06 NOTE — Patient Instructions (Signed)

## 2019-05-27 ENCOUNTER — Encounter: Payer: Self-pay | Admitting: *Deleted

## 2019-05-29 ENCOUNTER — Encounter: Payer: Self-pay | Admitting: Internal Medicine

## 2019-05-29 ENCOUNTER — Ambulatory Visit: Payer: Commercial Managed Care - PPO | Admitting: Internal Medicine

## 2019-05-29 VITALS — BP 92/60 | HR 88 | Temp 98.9°F | Ht 63.0 in | Wt 126.0 lb

## 2019-05-29 DIAGNOSIS — D500 Iron deficiency anemia secondary to blood loss (chronic): Secondary | ICD-10-CM

## 2019-05-29 DIAGNOSIS — Z86010 Personal history of colonic polyps: Secondary | ICD-10-CM | POA: Diagnosis not present

## 2019-05-29 DIAGNOSIS — K50013 Crohn's disease of small intestine with fistula: Secondary | ICD-10-CM

## 2019-05-29 DIAGNOSIS — E538 Deficiency of other specified B group vitamins: Secondary | ICD-10-CM

## 2019-05-29 MED ORDER — SUTAB 1479-225-188 MG PO TABS: 1.0000 | ORAL_TABLET | ORAL | 0 refills | Status: DC

## 2019-05-29 NOTE — Patient Instructions (Addendum)
Your provider has requested that you go to the basement level for lab work on Jun 10 2019. Press "B" on the elevator. The lab is located at the first door on the left as you exit the elevator.   You have been scheduled for a colonoscopy. Please follow written instructions given to you at your visit today.  Please pick up your prep supplies at the pharmacy within the next 1-3 days. If you use inhalers (even only as needed), please bring them with you on the day of your procedure. Your physician has requested that you go to www.startemmi.com and enter the access code given to you at your visit today. This web site gives a general overview about your procedure. However, you should still follow specific instructions given to you by our office regarding your preparation for the procedure. ______________________________________________________ If you are age 68 or older, your body mass index should be between 23-30. Your Body mass index is 22.32 kg/m. If this is out of the aforementioned range listed, please consider follow up with your Primary Care Provider.  If you are age 46 or younger, your body mass index should be between 19-25. Your Body mass index is 22.32 kg/m. If this is out of the aformentioned range listed, please consider follow up with your Primary Care Provider.  _____________________________________________________ Due to recent changes in healthcare laws, you may see the results of your imaging and laboratory studies on MyChart before your provider has had a chance to review them.  We understand that in some cases there may be results that are confusing or concerning to you. Not all laboratory results come back in the same time frame and the provider may be waiting for multiple results in order to interpret others.  Please give [place] 48 hours in order for your provider to thoroughly review all the results before contacting the office for clarification of your results.

## 2019-05-29 NOTE — Progress Notes (Signed)
Subjective:    Patient ID: Carla Clark, female    DOB: 12-Sep-1985, 34 y.o.   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  I last saw her in January 2021.  She is here alone today.  She reports that overall she has been doing fairly well, about the same.  Still has multiple days a week of mid and right-sided abdominal pain which seems to radiate towards the epigastrium.  Bowel habits have been normal for her usually 1 bowel movement a day without diarrhea.  No blood in stool or melena.  Some nausea occasionally but no vomiting.  She is taking Humira 40 mg every 14 days and last dose was 05/27/2019.  Her son is now 66 months old.  They are starting to think about having another child but not actively trying to conceive.  She has completed her COVID-19 vaccination.  She had the MR enterography, see below   Review of Systems As per HPI, otherwise negative  Current Medications, Allergies, Past Medical History, Past Surgical History, Family History and Social History were reviewed in [employer] record.     Objective:   Physical Exam BP 92/60 (BP Location: Left Arm, Patient Position: Sitting, Cuff Size: Normal)   Pulse 88   Temp 98.9 F (37.2 C)   Ht 5' 3"  (1.6 m) Comment: height measured without shoes  Wt 126 lb (57.2 kg)   LMP 05/11/2019   Breastfeeding No   BMI 22.32 kg/m  Gen: awake, alert, NAD HEENT: anicteric Abd: soft, tender right abdomen, ND, +BS throughout Ext: no c/c/e Neuro: nonfocal  MR ABDOMEN AND PELVIS WITHOUT AND WITH CONTRAST (MR ENTEROGRAPHY)   TECHNIQUE: Multiplanar, multisequence MRI of the abdomen and pelvis was performed both before and during bolus administration of intravenous contrast. Negative oral contrast VoLumen was given.   CONTRAST:  22m GADAVIST  GADOBUTROL 1 MMOL/ML IV SOLN   COMPARISON:  CT 04/16/2011   FINDINGS: COMBINED FINDINGS FOR BOTH MR ABDOMEN AND PELVIS   Lower chest: Lung bases are clear.   Hepatobiliary: No focal hepatic lesion. No biliary duct dilatation. Gallbladder is normal. Common bile duct is normal.   Pancreas: Pancreas is normal. No ductal dilatation. No pancreatic inflammation.   Spleen: Normal spleen   Adrenals/urinary tract: Adrenal glands and kidneys are normal. The ureters and bladder normal.   Stomach/Bowel: GI track is distended oral contrast.   The stomach duodenum appear normal. Normal mucosal pattern of the jejunum. Normal mucosal pattern of the mid ileum without evidence of obstruction. No inflammation.   There is a long segment small bowel thickening and inflammatory stricturing involving the distal ileum leading up to the terminal ileum. This occurs over 6-8 cm segment of distal ileum (image 16/3, T2 weighted imaging) with enhancing mucosa evident evident on postcontrast T1 weighted imaging (image 34/3).   Proximal to this segment of distal ileal stricturing is pre stenotic dilatation of the small bowel measuring up to 3.7 cm (image 11/3). There is fecalization of the enteric contents in this pre stenotic small-bowel dilatation as seen on coronal image 23/33 postcontrast T1 weighted imaging.   At the distal aspect of pre stenotic small bowel dilatation there is a small rent within the bowel wall (image 25/33 and image 11/3). Communicating with this small rent is an extraluminal  collection with a peripheral enhancement and associated small bowel tethering. This extraluminal collection measures 1.9 by 1.7 cm (image 13/3) and is also well seen on image 26/33 of the coronal postcontrast T1 weighted imaging. There is again retraction of the bowel and inflammation surrounding this collection. However, No clear communication between the small bowel and the collection.   In comparison  to CT 09/05/2012, there is a near identical extraluminal collection (image 49/2) at this location. No change in size over this long interval. There was enteric contents within this collection at that time.   Vascular/Lymphatic: Abdominal aorta is normal caliber. No periportal or retroperitoneal adenopathy. No pelvic adenopathy.   Reproductive: Uterus and ovaries normal.   Other: No free fluid.   Musculoskeletal: No aggressive osseous lesion.   IMPRESSION: 1. Long segment of small bowel stricturing leading up the terminal ileum with pre stenotic dilatation of the small bowel findings are very similar to comparison exam with mild increase in the diameter of the pre stenotic portion. No evidence of bowel obstruction. 2. Small extraluminal collection extending from the pre stenotic ileal dilatation with chronic retraction and tethering of adjacent loops of small bowel. This small collection is nearly identical to CT of 09/05/2012 at which time it contained enteric contents. This presumed to represent a small fistula from the pre stenotic dilated small bowel. No clear evidence of communication with adjacent loops of small-bowel 3. No evidence of inflammation the stomach, duodenum or proximal small bowel. 4. No colonic inflammation identified.     Electronically Signed   By: Suzy Bouchard M.D.   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  1.  Ileal Crohn's disease with stricture --MR enterography, which we reviewed together today, confirms a long segment of small bowel stricturing leading up to the terminal ileum with prestenotic dilation.  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  I have referred her to see Dr. Drue Fl[facility] at [facility] to consider ileal resection with primary  anastomosis.  This is not an emergency but she does have daily symptoms and medical therapy alone will not improve her stricturing or fistula at this point.  The goal would be if she is able to have successful surgery to resume anti-TNF therapy as soon as possible to prevent disease recurrence of the postoperative state.  We discussed this at length today including timing of surgery, recovery.  She has legitimate concerns about future fertility which hopefully the surgery would not affect fertility being that it does not involve the pelvis, however she should discuss this with Dr. Drue Flirt.  I will have her continue Humira but check a trough level and rule out antibodies. --Appointment with Dr. Drue Flirt on 06/18/2019 to discuss surgical management of ileal Crohn's with stricture and fistula --Continue Humira 40 mg every 14 days --Return for Humira level with antibody testing on 06/10/2019 just before her next dose  2.  History of sessile serrated polyp --surveillance colonoscopy is recommended.  We discussed the risk, benefits and alternatives and she is agreeable to proceed with surveillance colonoscopy on 07/01/2019.  We will prep with SuTab  3.  B12 deficiency --continue oral B12  4.  Iron deficiency anemia --she follows with hematology and has received IV iron.  She has  follow-up with them in June.  She should continue IV iron as needed to support iron counts  40 minutes total spent today including patient facing time, coordination of care, reviewing medical history/procedures/pertinent radiology studies, and documentation of the encounter.

## 2019-06-10 ENCOUNTER — Other Ambulatory Visit: Payer: Commercial Managed Care - PPO

## 2019-06-10 DIAGNOSIS — Z86010 Personal history of colonic polyps: Secondary | ICD-10-CM

## 2019-06-10 DIAGNOSIS — K50013 Crohn's disease of small intestine with fistula: Secondary | ICD-10-CM

## 2019-06-17 ENCOUNTER — Other Ambulatory Visit: Payer: Commercial Managed Care - PPO

## 2019-06-17 ENCOUNTER — Ambulatory Visit: Payer: Commercial Managed Care - PPO | Admitting: Family

## 2019-06-17 LAB — ADALIMUMAB+AB (SERIAL MONITOR)
Adalimumab Drug Level: 3.1 ug/mL
Anti-Adalimumab Antibody: 32 ng/mL

## 2019-06-17 LAB — SERIAL MONITORING

## 2019-06-26 ENCOUNTER — Encounter: Payer: Self-pay | Admitting: Family

## 2019-06-26 ENCOUNTER — Inpatient Hospital Stay (HOSPITAL_BASED_OUTPATIENT_CLINIC_OR_DEPARTMENT_OTHER): Payer: Commercial Managed Care - PPO | Admitting: Family

## 2019-06-26 ENCOUNTER — Inpatient Hospital Stay: Payer: Commercial Managed Care - PPO | Attending: Family

## 2019-06-26 ENCOUNTER — Other Ambulatory Visit: Payer: Self-pay

## 2019-06-26 ENCOUNTER — Telehealth: Payer: Self-pay | Admitting: Internal Medicine

## 2019-06-26 VITALS — BP 111/70 | HR 102 | Temp 99.0°F | Resp 20 | Wt 124.1 lb

## 2019-06-26 DIAGNOSIS — K5090 Crohn's disease, unspecified, without complications: Secondary | ICD-10-CM | POA: Insufficient documentation

## 2019-06-26 DIAGNOSIS — D500 Iron deficiency anemia secondary to blood loss (chronic): Secondary | ICD-10-CM | POA: Diagnosis not present

## 2019-06-26 DIAGNOSIS — K909 Intestinal malabsorption, unspecified: Secondary | ICD-10-CM | POA: Insufficient documentation

## 2019-06-26 DIAGNOSIS — Z79899 Other long term (current) drug therapy: Secondary | ICD-10-CM | POA: Diagnosis not present

## 2019-06-26 LAB — CBC WITH DIFFERENTIAL (CANCER CENTER ONLY)
Abs Immature Granulocytes: 0.15 10*3/uL — ABNORMAL HIGH (ref 0.00–0.07)
Basophils Absolute: 0.1 10*3/uL (ref 0.0–0.1)
Basophils Relative: 1 %
Eosinophils Absolute: 0.3 10*3/uL (ref 0.0–0.5)
Eosinophils Relative: 3 %
HCT: 39.3 % (ref 36.0–46.0)
Hemoglobin: 12.3 g/dL (ref 12.0–15.0)
Immature Granulocytes: 2 %
Lymphocytes Relative: 36 %
Lymphs Abs: 3.2 10*3/uL (ref 0.7–4.0)
MCH: 27.3 pg (ref 26.0–34.0)
MCHC: 31.3 g/dL (ref 30.0–36.0)
MCV: 87.1 fL (ref 80.0–100.0)
Monocytes Absolute: 0.6 10*3/uL (ref 0.1–1.0)
Monocytes Relative: 7 %
Neutro Abs: 4.5 10*3/uL (ref 1.7–7.7)
Neutrophils Relative %: 51 %
Platelet Count: 407 10*3/uL — ABNORMAL HIGH (ref 150–400)
RBC: 4.51 MIL/uL (ref 3.87–5.11)
RDW: 13.5 % (ref 11.5–15.5)
WBC Count: 8.7 10*3/uL (ref 4.0–10.5)
nRBC: 0 % (ref 0.0–0.2)

## 2019-06-26 LAB — RETICULOCYTES
Immature Retic Fract: 9.4 % (ref 2.3–15.9)
RBC.: 4.6 MIL/uL (ref 3.87–5.11)
Retic Count, Absolute: 51.1 10*3/uL (ref 19.0–186.0)
Retic Ct Pct: 1.1 % (ref 0.4–3.1)

## 2019-06-26 NOTE — Progress Notes (Signed)
Hematology and Oncology Follow Up Visit  Carla Clark [id number] Jun 06, 1985 33 y.o. 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 She is doing well but states that she will be having a small bowel resection due to stricture and scar tissue on 07/11/2019.  She states that she notes blood in her stool about once a week.  Her cycle is now regular and not heavy.  She denies fever, chills, n/v, cough, rash, dizziness, SOB, chest pain, palpitations, abdominal pain or changes in bowel or bladder habits.  No swelling, tenderness, numbness or tingling in her extremities.  No falls or syncopal episodes.  She is eating well and staying properly hydrated. Her weight is stable at 124 lbs.   ECOG Performance Status: 1 - Symptomatic but completely ambulatory  Medications:  Allergies as of 06/26/2019   No Known Allergies     Medication List       Accurate as of June 26, 2019  2:26 PM. If you have any questions, ask your nurse or doctor.        Humira Pen 40 MG/0.4ML Pnkt Generic drug: Adalimumab Inject 40 mg into the skin every 14 (fourteen) days.   Misc Intestinal Flora Regulat Chew Chew by mouth every other day. Digestive Health probiotic gummies by [employer]   mupirocin ointment 2 % Commonly known as: Bactroban Place 1 application into the nose 2 (two) times daily. What changed: how to take this   Sutab 1479-225-188 MG Tabs Generic drug: Sodium Sulfate-Mag Sulfate-KCl Take 1 kit by mouth as directed. BIN: [id number] PCN: CN GROUP: [id number] MEMBER ID: [id number]; DO NOT RUN AS CASH   vitamin B-12 100 MCG tablet Commonly known as: CYANOCOBALAMIN Take 100 mcg by mouth daily.       Allergies: No Known Allergies  Past Medical History, Surgical history, Social history, and Family History were reviewed and  updated.  Review of Systems: All other 10 point review of systems is negative.   Physical Exam:  weight is 124 lb 1.3 oz (56.3 kg). Her oral temperature is 99 F (37.2 C). Her blood pressure is 111/70 and her pulse is 102 (abnormal). Her respiration is 20 and oxygen saturation is 99%.   Wt Readings from Last 3 Encounters:  06/26/19 124 lb 1.3 oz (56.3 kg)  05/29/19 126 lb (57.2 kg)  03/18/19 127 lb 12.8 oz (58 kg)    Ocular: Sclerae unicteric, pupils equal, round and reactive to light Ear-nose-throat: Oropharynx clear, dentition fair Lymphatic: No cervical or supraclavicular adenopathy Lungs no rales or rhonchi, good excursion bilaterally Heart regular rate and rhythm, no murmur appreciated Abd soft, nontender, positive bowel sounds, no liver or spleen tip palpated on exam, no fluid wave  MSK no focal spinal tenderness, no joint edema Neuro: non-focal, well-oriented, appropriate affect Breasts: Deferred   Lab Results  Component Value Date   WBC 8.7 06/26/2019   HGB 12.3 06/26/2019   HCT 39.3 06/26/2019   MCV 87.1 06/26/2019   PLT 407 (H) 06/26/2019   Lab Results  Component Value Date   FERRITIN 171 03/18/2019   IRON 20 (L) 03/18/2019   TIBC 268 03/18/2019   UIBC 248 03/18/2019   IRONPCTSAT 7 (L) 03/18/2019   Lab Results  Component Value Date   RETICCTPCT 1.1 06/26/2019   RBC  4.51 06/26/2019   RETICCTABS 13.7 (L) 01/20/2015   No results found for: KPAFRELGTCHN, LAMBDASER, [facility] Lab Results  Component Value Date   IGA 187 03/11/2016   No results found for: [name], SPEI   Chemistry      Component Value Date/Time   NA 138 12/26/2018 1610   K 3.7 12/26/2018 1610   CL 105 12/26/2018 1610   CO2 27 12/26/2018 1610   BUN 8 12/26/2018 1610   CREATININE 0.73 12/26/2018 1610   CREATININE 0.62 08/20/2012 1139      Component Value Date/Time   CALCIUM 8.7 12/26/2018 1610   ALKPHOS 67 12/26/2018 1610     AST 11 12/26/2018 1610   ALT 13 12/26/2018 1610   BILITOT 0.3 12/26/2018 1610       Impression and Plan: Carla Clark is a very pleasant 34 yo caucasian female with history of iron deficiency anemia secondary to 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  Iron studies are pending and we will replace if needed.  We will plan to see her back in another 1 month to make sure she is not anemia after surgery.  She will contact our office with any questions or concerns. We can certainly see her sooner if needed.   [name], NP 6/2/20212:26 PM

## 2019-06-26 NOTE — Telephone Encounter (Signed)
Pharmacy indicates that Carla Clark is not covered through patient's insurance. I advised that they need to run the coupon code we sent them. They have now ran script and indicate script went through for 40 dollars. I have indicated this to patient. In addition, I advised that pharmacy says that they should have sutab tomorrow. She verbalizes understanding.

## 2019-06-26 NOTE — Telephone Encounter (Signed)
Patient called stated pharmacy told her to call for an alternative prep medication no reason given. Patient is scheduled for Monday

## 2019-06-27 LAB — FERRITIN: Ferritin: 186 ng/mL (ref 11–307)

## 2019-06-27 LAB — IRON AND TIBC
Iron: 22 ug/dL — ABNORMAL LOW (ref 41–142)
Saturation Ratios: 9 % — ABNORMAL LOW (ref 21–57)
TIBC: 263 ug/dL (ref 236–444)
UIBC: 240 ug/dL (ref 120–384)

## 2019-07-01 ENCOUNTER — Other Ambulatory Visit: Payer: Self-pay

## 2019-07-01 ENCOUNTER — Ambulatory Visit (AMBULATORY_SURGERY_CENTER): Payer: Commercial Managed Care - PPO | Admitting: Internal Medicine

## 2019-07-01 ENCOUNTER — Encounter: Payer: Self-pay | Admitting: Internal Medicine

## 2019-07-01 ENCOUNTER — Encounter: Payer: Commercial Managed Care - PPO | Admitting: Internal Medicine

## 2019-07-01 ENCOUNTER — Telehealth: Payer: Self-pay | Admitting: Internal Medicine

## 2019-07-01 VITALS — BP 99/69 | HR 71 | Temp 97.1°F | Resp 13 | Ht 63.0 in | Wt 126.0 lb

## 2019-07-01 DIAGNOSIS — K5010 Crohn's disease of large intestine without complications: Secondary | ICD-10-CM | POA: Diagnosis not present

## 2019-07-01 DIAGNOSIS — K602 Anal fissure, unspecified: Secondary | ICD-10-CM | POA: Diagnosis not present

## 2019-07-01 DIAGNOSIS — Z86010 Personal history of colonic polyps: Secondary | ICD-10-CM

## 2019-07-01 DIAGNOSIS — K50013 Crohn's disease of small intestine with fistula: Secondary | ICD-10-CM

## 2019-07-01 DIAGNOSIS — K5000 Crohn's disease of small intestine without complications: Secondary | ICD-10-CM

## 2019-07-01 MED ORDER — SODIUM CHLORIDE 0.9 % IV SOLN: 500.0000 mL | Freq: Once | INTRAVENOUS | Status: DC

## 2019-07-01 NOTE — Progress Notes (Signed)
Pt's states no medical or surgical changes since previsit or office visit. 

## 2019-07-01 NOTE — Progress Notes (Signed)
Called to room to assist during endoscopic procedure.  Patient ID and intended procedure confirmed with present staff. Received instructions for my participation in the procedure from the performing physician.  

## 2019-07-01 NOTE — Patient Instructions (Signed)
YOU HAD AN ENDOSCOPIC PROCEDURE TODAY AT THE [facility]:   Refer to the procedure report that was given to you for any specific questions about what was found during the examination.  If the procedure report does not answer your questions, please call your gastroenterologist to clarify.  If you requested that your care partner not be given the details of your procedure findings, then the procedure report has been included in a sealed envelope for you to review at your convenience later.  YOU SHOULD EXPECT: Some feelings of bloating in the abdomen. Passage of more gas than usual.  Walking can help get rid of the air that was put into your GI tract during the procedure and reduce the bloating. If you had a lower endoscopy (such as a colonoscopy or flexible sigmoidoscopy) you may notice spotting of blood in your stool or on the toilet paper. If you underwent a bowel prep for your procedure, you may not have a normal bowel movement for a few days.  Please Note:  You might notice some irritation and congestion in your nose or some drainage.  This is from the oxygen used during your procedure.  There is no need for concern and it should clear up in a day or so.  SYMPTOMS TO REPORT IMMEDIATELY:   Following lower endoscopy (colonoscopy or flexible sigmoidoscopy):  Excessive amounts of blood in the stool  Significant tenderness or worsening of abdominal pains  Swelling of the abdomen that is new, acute  Fever of 100F or higher  For urgent or emergent issues, a gastroenterologist can be reached at any hour by calling [phone]. Do not use MyChart messaging for urgent concerns.    DIET:  We do recommend a small meal at first, but then you may proceed to your regular diet.  Drink plenty of fluids but you should avoid alcoholic beverages for 24 hours.  ACTIVITY:  You should plan to take it easy for the rest of today and you should NOT DRIVE or use heavy machinery until tomorrow (because  of the sedation medicines used during the test).    FOLLOW UP: Our staff will call the number listed on your records 48-72 hours following your procedure to check on you and address any questions or concerns that you may have regarding the information given to you following your procedure. If we do not reach you, we will leave a message.  We will attempt to reach you two times.  During this call, we will ask if you have developed any symptoms of COVID 19. If you develop any symptoms (ie: fever, flu-like symptoms, shortness of breath, cough etc.) before then, please call [phone].  If you test positive for Covid 19 in the 2 weeks post procedure, please call and report this information to us.    If any biopsies were taken you will be contacted by phone or by letter within the next 1-3 weeks.  Please call us at [phone] if you have not heard about the biopsies in 3 weeks.    SIGNATURES/CONFIDENTIALITY: You and/or your care partner have signed paperwork which will be entered into your electronic medical record.  These signatures attest to the fact that that the information above on your After Visit Summary has been reviewed and is understood.  Full responsibility of the confidentiality of this discharge information lies with you and/or your care-partner. 

## 2019-07-01 NOTE — Op Note (Signed)
[facility] Patient Name: Carla Clark Procedure Date: 07/01/2019 3:19 PM MRN: [id number] Endoscopist: Jerene Bears , MD Age: 34 Referring MD:  Date of Birth: 20-Jun-1985 Gender: Female Account #: [id number] Procedure:                Colonoscopy Indications:              Personal history of Crohn's disease of the small                            bowel with history of stricture and fistula,                            personal history of sessile serrated polyp (< 10                            mm) at last colonoscopy June 2018, plan for                            ileocecectomy with Dr. Drue Flirt on 07/11/2019 Medicines:                Monitored Anesthesia Care Procedure:                Pre-Anesthesia Assessment:                           - Prior to the procedure, a History and Physical                            was performed, and patient medications and                            allergies were reviewed. The patient's tolerance of                            previous anesthesia was also reviewed. The risks                            and benefits of the procedure and the sedation                            options and risks were discussed with the patient.                            All questions were answered, and informed consent                            was obtained. Prior Anticoagulants: The patient has                            taken no previous anticoagulant or antiplatelet                            agents. ASA Grade Assessment: II - A patient with  mild systemic disease. After reviewing the risks                            and benefits, the patient was deemed in                            satisfactory condition to undergo the procedure.                           After obtaining informed consent, the colonoscope                            was passed under direct vision. Throughout the                            procedure, the patient's blood  pressure, pulse, and                            oxygen saturations were monitored continuously. The                            Colonoscope was introduced through the anus and                            advanced to the cecum, identified by appendiceal                            orifice and ileocecal valve. The colonoscopy was                            performed without difficulty. The patient tolerated                            the procedure well. The quality of the bowel                            preparation was good. The ileocecal valve,                            appendiceal orifice, and rectum were photographed. Scope In: 3:41:11 PM Scope Out: 3:58:50 PM Scope Withdrawal Time: 0 hours 13 minutes 22 seconds  Total Procedure Duration: 0 hours 17 minutes 39 seconds  Findings:                 The digital rectal exam was normal.                           Localized moderate inflammation characterized by                            congestion (edema), polypoid mucosa, erythema,                            friability and granularity was found at the  ileocecal valve. Multiple biopsies were taken with                            a cold forceps for histology. Ileal intubation not                            possible due to stricturing.                           Two discrete areas most consistent with fistulae                            were found in the mid transverse colon. These areas                            have surrounding inflammation and polypoid tissue.                            Biopsies were taken with a cold forceps for                            histology.                           The exam was otherwise normal throughout the                            examined colon.                           A small anal fissure was found in the anal canal.                            This may be mild perianal involvement by Crohn's                             disease. Complications:            No immediate complications. Estimated Blood Loss:     Estimated blood loss was minimal. Impression:               - Localized moderate inflammation was found at the                            ileocecal valve secondary to Crohn's disease with                            ileitis. Biopsied.                           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                            Biopsied.                           -  Anal fissure versus mild perianal Crohn's disease. Recommendation:           - Patient has a contact number available for                            emergencies. The signs and symptoms of potential                            delayed complications were discussed with the                            patient. Return to normal activities tomorrow.                            Written discharge instructions were provided to the                            patient.                           - Resume previous diet.                           - Continue present medications. Humira on hold for                            upcoming surgery.                           - Anticipate resuming biologic therapy in the                            immediate post-operative period when felt                            appropriate per Dr. Drue Flirt. Will likely change to                            infliximab or certolizumab given adalimumab                            antibodies.                           - Await pathology results.                           - Repeat colonoscopy is recommended. The                            colonoscopy date will be determined after pathology                            results from today's exam become available for                            review. Ulice Dash  Everitt Amber, MD 07/01/2019 4:12:10 PM This report has been signed electronically. CC Letter to:             Dr. Ellison Hughs, [facility]

## 2019-07-01 NOTE — Telephone Encounter (Signed)
Called patient back and she explained that she had problems with the sutab last night and had vomited about an hour after taking them. She called the MD on call and was instructed to try the miralax and Gatorade. She thinks instead of drinking half of the mixture that she may have only drank a third. I told her to finish all that she could today before her cutoff time of 12:00. She agreed and understood and had no further questions.

## 2019-07-01 NOTE — Progress Notes (Signed)
pt tolerated well. VSS. awake and to recovery. Report given to RN.  

## 2019-07-03 ENCOUNTER — Telehealth: Payer: Self-pay

## 2019-07-03 ENCOUNTER — Telehealth: Payer: Self-pay | Admitting: *Deleted

## 2019-07-03 NOTE — Telephone Encounter (Signed)
No answer, left message to call back later today, B.Jeannetta Cerutti RN. 

## 2019-07-03 NOTE — Telephone Encounter (Signed)
  Follow up Call-  Call back number 07/01/2019  Post procedure Call Back phone  # [phone]  Permission to leave phone message Yes  Some recent data might be hidden     Patient questions:  Do you have a fever, pain , or abdominal swelling? No. Pain Score  0 *  Have you tolerated food without any problems? Yes.    Have you been able to return to your normal activities? Yes.    Do you have any questions about your discharge instructions: Diet   No. Medications  No. Follow up visit  No.  Do you have questions or concerns about your Care? No.  Actions: * If pain score is 4 or above: No action needed, pain <4.  1. Have you developed a fever since your procedure? no  2.   Have you had an respiratory symptoms (SOB or cough) since your procedure? no  3.   Have you tested positive for COVID 19 since your procedure no  4.   Have you had any family members/close contacts diagnosed with the COVID 19 since your procedure?  no   If yes to any of these questions please route to Joylene John, RN and Erenest Rasher, RN

## 2019-07-11 HISTORY — PX: LAPAROSCOPIC RIGHT HEMI COLECTOMY: SHX5926

## 2019-07-25 ENCOUNTER — Other Ambulatory Visit: Payer: Self-pay

## 2019-07-25 ENCOUNTER — Encounter: Payer: Self-pay | Admitting: Family

## 2019-07-25 ENCOUNTER — Inpatient Hospital Stay: Payer: Commercial Managed Care - PPO

## 2019-07-25 ENCOUNTER — Inpatient Hospital Stay: Payer: Commercial Managed Care - PPO | Attending: Family

## 2019-07-25 ENCOUNTER — Inpatient Hospital Stay (HOSPITAL_BASED_OUTPATIENT_CLINIC_OR_DEPARTMENT_OTHER): Payer: Commercial Managed Care - PPO | Admitting: Family

## 2019-07-25 VITALS — BP 95/57 | HR 67 | Temp 98.5°F | Resp 16 | Ht 63.0 in | Wt 120.5 lb

## 2019-07-25 DIAGNOSIS — K5090 Crohn's disease, unspecified, without complications: Secondary | ICD-10-CM | POA: Insufficient documentation

## 2019-07-25 DIAGNOSIS — D508 Other iron deficiency anemias: Secondary | ICD-10-CM

## 2019-07-25 DIAGNOSIS — K909 Intestinal malabsorption, unspecified: Secondary | ICD-10-CM

## 2019-07-25 DIAGNOSIS — D500 Iron deficiency anemia secondary to blood loss (chronic): Secondary | ICD-10-CM | POA: Diagnosis not present

## 2019-07-25 LAB — CBC WITH DIFFERENTIAL (CANCER CENTER ONLY)
Abs Immature Granulocytes: 0.05 10*3/uL (ref 0.00–0.07)
Basophils Absolute: 0.1 10*3/uL (ref 0.0–0.1)
Basophils Relative: 1 %
Eosinophils Absolute: 0.3 10*3/uL (ref 0.0–0.5)
Eosinophils Relative: 3 %
HCT: 37.1 % (ref 36.0–46.0)
Hemoglobin: 11.4 g/dL — ABNORMAL LOW (ref 12.0–15.0)
Immature Granulocytes: 0 %
Lymphocytes Relative: 30 %
Lymphs Abs: 3.4 10*3/uL (ref 0.7–4.0)
MCH: 27.4 pg (ref 26.0–34.0)
MCHC: 30.7 g/dL (ref 30.0–36.0)
MCV: 89.2 fL (ref 80.0–100.0)
Monocytes Absolute: 0.8 10*3/uL (ref 0.1–1.0)
Monocytes Relative: 7 %
Neutro Abs: 6.9 10*3/uL (ref 1.7–7.7)
Neutrophils Relative %: 59 %
Platelet Count: 519 10*3/uL — ABNORMAL HIGH (ref 150–400)
RBC: 4.16 MIL/uL (ref 3.87–5.11)
RDW: 14.1 % (ref 11.5–15.5)
WBC Count: 11.5 10*3/uL — ABNORMAL HIGH (ref 4.0–10.5)
nRBC: 0 % (ref 0.0–0.2)

## 2019-07-25 LAB — RETICULOCYTES
Immature Retic Fract: 10.8 % (ref 2.3–15.9)
RBC.: 4.14 MIL/uL (ref 3.87–5.11)
Retic Count, Absolute: 82.4 10*3/uL (ref 19.0–186.0)
Retic Ct Pct: 2 % (ref 0.4–3.1)

## 2019-07-25 MED ORDER — SODIUM CHLORIDE 0.9 % IV SOLN
INTRAVENOUS | Status: DC
  Filled 2019-07-25: qty 250

## 2019-07-25 MED ORDER — SODIUM CHLORIDE 0.9 % IV SOLN
200.0000 mg | Freq: Once | INTRAVENOUS | Status: AC
  Administered 2019-07-25: 200 mg via INTRAVENOUS
  Filled 2019-07-25: qty 200

## 2019-07-25 NOTE — Progress Notes (Signed)
Hematology and Oncology Follow Up Visit  Carla Clark [id number] October 05, 1985 34 y.o. 07/25/2019   Principle Diagnosis:  Iron deficiency anemia secondary to chronic blood loss due to Crohn's disease  Current Therapy: IV iron as indicated   Interim History:  Carla Clark is here today for follow-up. She had the right colectomy with small bowel resection on 07/11/2019 at [facility] and did quite well.  She is recuperating nicely and is currently on Lovenox 40 mg SQ daily. She states that she still has 3-4 days left.  She notes that she is bruising a little easier but not in excess.  She has had minimal blood in her stool during the healing process.  Hgb is 11.4, WBC count 11.5 and platelets 519.  No fever, chills, n/v, cough, rash, dizziness, SOB, chest pain, palpitations or changes in bowel or bladder habit.  No swelling, tenderness, numbness or tingling in her extremities.  No falls or syncope.  She is eating well and staying well hydrated. Her weight is stable at 120 lbs.   ECOG Performance Status: 1 - Symptomatic but completely ambulatory  Medications:  Allergies as of 07/25/2019   No Known Allergies     Medication List       Accurate as of July 25, 2019  1:22 PM. If you have any questions, ask your nurse or doctor.        Humira Pen 40 MG/0.4ML Pnkt Generic drug: Adalimumab Inject 40 mg into the skin every 14 (fourteen) days.   Misc Intestinal Flora Regulat Chew Chew by mouth every other day. Digestive Health probiotic gummies by Schiff   mupirocin ointment 2 % Commonly known as: Bactroban Place 1 application into the nose 2 (two) times daily. What changed: how to take this   Sutab [id number] MG Tabs Generic drug: Sodium Sulfate-Mag Sulfate-KCl Take 1 kit by mouth as directed. BIN: [id number] PCN: CN GROUP: [id number] MEMBER ID: [id number]; DO NOT RUN AS CASH   vitamin B-12 100 MCG tablet Commonly known as: CYANOCOBALAMIN Take 100 mcg by mouth daily.        Allergies: No Known Allergies  Past Medical History, Surgical history, Social history, and Family History were reviewed and updated.  Review of Systems: All other 10 point review of systems is negative.   Physical Exam:  vitals were not taken for this visit.   Wt Readings from Last 3 Encounters:  07/01/19 126 lb (57.2 kg)  06/26/19 124 lb 1.3 oz (56.3 kg)  05/29/19 126 lb (57.2 kg)    Ocular: Sclerae unicteric, pupils equal, round and reactive to light Ear-nose-throat: Oropharynx clear, dentition fair Lymphatic: No cervical or supraclavicular adenopathy Lungs no rales or rhonchi, good excursion bilaterally Heart regular rate and rhythm, no murmur appreciated Abd soft, nontender, positive bowel sounds, no liver or spleen tip palpated on exam, no fluid wave  MSK no focal spinal tenderness, no joint edema Neuro: non-focal, well-oriented, appropriate affect Breasts: Deferred   Lab Results  Component Value Date   WBC 8.7 06/26/2019   HGB 12.3 06/26/2019   HCT 39.3 06/26/2019   MCV 87.1 06/26/2019   PLT 407 (H) 06/26/2019   Lab Results  Component Value Date   FERRITIN 186 06/26/2019   IRON 22 (L) 06/26/2019   TIBC 263 06/26/2019   UIBC 240 06/26/2019   IRONPCTSAT 9 (L) 06/26/2019   Lab Results  Component Value Date   RETICCTPCT 1.1 06/26/2019   RBC 4.51 06/26/2019   RETICCTABS 13.7 (L) 01/20/2015   No results  found for: Nils Pyle, [facility] Lab Results  Component Value Date   IGA 187 03/11/2016   No results found for: [name], SPEI   Chemistry      Component Value Date/Time   NA 138 12/26/2018 1610   K 3.7 12/26/2018 1610   CL 105 12/26/2018 1610   CO2 27 12/26/2018 1610   BUN 8 12/26/2018 1610   CREATININE 0.73 12/26/2018 1610   CREATININE 0.62 08/20/2012 1139      Component Value Date/Time   CALCIUM 8.7 12/26/2018 1610   ALKPHOS 67 12/26/2018 1610   AST 11 12/26/2018 1610    ALT 13 12/26/2018 1610   BILITOT 0.3 12/26/2018 1610       Impression and Plan: Carla Clark is a very pleasant 34 yo caucasian female with history of iron deficiency anemia secondary to 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 Iron studies are pending and we will replace if needed.  We will plan to see her back in 3 months.  She will contact our office with any questions or concerns. We can certainly see her sooner if needed.   [name], NP 7/1/20211:22 PM

## 2019-07-25 NOTE — Patient Instructions (Signed)

## 2019-07-26 ENCOUNTER — Telehealth: Payer: Self-pay | Admitting: Family

## 2019-07-26 LAB — IRON AND TIBC
Iron: 43 ug/dL (ref 41–142)
Saturation Ratios: 13 % — ABNORMAL LOW (ref 21–57)
TIBC: 331 ug/dL (ref 236–444)
UIBC: 289 ug/dL (ref 120–384)

## 2019-07-26 LAB — FERRITIN: Ferritin: 220 ng/mL (ref 11–307)

## 2019-07-26 NOTE — Telephone Encounter (Signed)
No LOS for 7/1

## 2019-07-30 ENCOUNTER — Inpatient Hospital Stay: Payer: Commercial Managed Care - PPO

## 2019-07-30 ENCOUNTER — Other Ambulatory Visit: Payer: Self-pay

## 2019-07-30 VITALS — BP 102/67 | HR 77 | Temp 98.2°F

## 2019-07-30 DIAGNOSIS — D508 Other iron deficiency anemias: Secondary | ICD-10-CM

## 2019-07-30 DIAGNOSIS — D500 Iron deficiency anemia secondary to blood loss (chronic): Secondary | ICD-10-CM | POA: Diagnosis not present

## 2019-07-30 MED ORDER — SODIUM CHLORIDE 0.9 % IV SOLN
Freq: Once | INTRAVENOUS | Status: AC
  Filled 2019-07-30: qty 250

## 2019-07-30 MED ORDER — SODIUM CHLORIDE 0.9 % IV SOLN
200.0000 mg | Freq: Once | INTRAVENOUS | Status: AC
  Administered 2019-07-30: 200 mg via INTRAVENOUS
  Filled 2019-07-30: qty 200

## 2019-07-30 NOTE — Patient Instructions (Signed)

## 2019-08-02 ENCOUNTER — Inpatient Hospital Stay: Payer: Commercial Managed Care - PPO

## 2019-08-02 ENCOUNTER — Other Ambulatory Visit: Payer: Self-pay

## 2019-08-02 VITALS — BP 100/60 | HR 79 | Temp 98.1°F | Resp 17

## 2019-08-02 DIAGNOSIS — D508 Other iron deficiency anemias: Secondary | ICD-10-CM

## 2019-08-02 DIAGNOSIS — D500 Iron deficiency anemia secondary to blood loss (chronic): Secondary | ICD-10-CM | POA: Diagnosis not present

## 2019-08-02 MED ORDER — SODIUM CHLORIDE 0.9 % IV SOLN
INTRAVENOUS | Status: DC
  Filled 2019-08-02: qty 250

## 2019-08-02 MED ORDER — SODIUM CHLORIDE 0.9 % IV SOLN
200.0000 mg | Freq: Once | INTRAVENOUS | Status: AC
  Administered 2019-08-02: 200 mg via INTRAVENOUS
  Filled 2019-08-02: qty 200

## 2019-08-06 ENCOUNTER — Other Ambulatory Visit: Payer: Self-pay

## 2019-08-06 ENCOUNTER — Inpatient Hospital Stay: Payer: Commercial Managed Care - PPO

## 2019-08-06 VITALS — BP 101/63 | HR 81 | Temp 98.2°F | Resp 17

## 2019-08-06 DIAGNOSIS — D500 Iron deficiency anemia secondary to blood loss (chronic): Secondary | ICD-10-CM | POA: Diagnosis not present

## 2019-08-06 DIAGNOSIS — D508 Other iron deficiency anemias: Secondary | ICD-10-CM

## 2019-08-06 MED ORDER — SODIUM CHLORIDE 0.9 % IV SOLN
200.0000 mg | Freq: Once | INTRAVENOUS | Status: AC
  Administered 2019-08-06: 200 mg via INTRAVENOUS
  Filled 2019-08-06: qty 200

## 2019-08-06 MED ORDER — SODIUM CHLORIDE 0.9 % IV SOLN
Freq: Once | INTRAVENOUS | Status: AC
  Filled 2019-08-06: qty 250

## 2019-08-06 NOTE — Patient Instructions (Signed)

## 2019-08-08 ENCOUNTER — Encounter: Payer: Self-pay | Admitting: *Deleted

## 2019-08-20 ENCOUNTER — Encounter: Payer: Self-pay | Admitting: Internal Medicine

## 2019-08-20 ENCOUNTER — Ambulatory Visit: Payer: Commercial Managed Care - PPO | Admitting: Internal Medicine

## 2019-08-20 VITALS — BP 102/62 | HR 78 | Ht 63.0 in | Wt 121.5 lb

## 2019-08-20 DIAGNOSIS — E538 Deficiency of other specified B group vitamins: Secondary | ICD-10-CM | POA: Diagnosis not present

## 2019-08-20 DIAGNOSIS — Z79899 Other long term (current) drug therapy: Secondary | ICD-10-CM

## 2019-08-20 DIAGNOSIS — K50013 Crohn's disease of small intestine with fistula: Secondary | ICD-10-CM

## 2019-08-20 DIAGNOSIS — D500 Iron deficiency anemia secondary to blood loss (chronic): Secondary | ICD-10-CM

## 2019-08-20 NOTE — Patient Instructions (Signed)
Please follow up with Dr Hilarie Fredrickson in 3 months.  If you are age 32 or older, your body mass index should be between 23-30. Your Body mass index is 21.52 kg/m. If this is out of the aforementioned range listed, please consider follow up with your Primary Care Provider.  If you are age 12 or younger, your body mass index should be between 19-25. Your Body mass index is 21.52 kg/m. If this is out of the aformentioned range listed, please consider follow up with your Primary Care Provider.   Due to recent changes in healthcare laws, you may see the results of your imaging and laboratory studies on MyChart before your provider has had a chance to review them.  We understand that in some cases there may be results that are confusing or concerning to you. Not all laboratory results come back in the same time frame and the provider may be waiting for multiple results in order to interpret others.  Please give [place] 48 hours in order for your provider to thoroughly review all the results before contacting the office for clarification of your results.

## 2019-08-20 NOTE — Progress Notes (Signed)
Subjective:    Patient ID: Carla Clark, female    DOB: 1985-04-12, 34 y.o.   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  I saw her in the office on 05/29/2019 for colonoscopy on 07/01/2019.  She recently had ileal resection and fistula repair with Dr. Drue Flirt.   Colonoscopy on 07/01/2019 showed moderate inflammation at the ileocecal valve which was biopsied.  Ileal intubation not possible due to stricturing.  There were 2 discrete areas consistent with fistulae in the mid transverse colon with surrounding inflammation.  The colon was otherwise normal.  There was a small anal fissure in the anal canal.  Pathology showed severely active chronic enteritis with ulcer at the IC valve.  In the transverse colon focally active chronic colitis.  On 07/11/2019 she under went laparoscopic assisted right hemicolectomy, small bowel resection with primary anastomosis, takedown of enterocolonic fistula and creation of omental pedicle flap with Dr. Drue Flirt.  Her surgery went well and she recovered well.  She did have an episode about 11 days ago of intense abdominal pain after eating asparagus.  She thought she may have to go to the ER but she was able to remain at home by using oxycodone.  She has had some weight loss during hospitalization but weight has been stable after discharge from surgery.  Her appetite is returning to normal.  She is not having much abdominal pain.  She is used some Tylenol for abdominal discomfort but no oxycodone in the last 11 days.  She feels that her energy level is returning to normal.  No nausea or vomiting.  Bowel habits have returned to normal.  No blood in stool or melena.   Review of Systems As per HPI, otherwise negative  Current Medications, Allergies,  Past Medical History, Past Surgical History, Family History and Social History were reviewed in [employer] record.     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   1.  Fistulizing and stricturing ileal Crohn's disease (right hemicolectomy with enterocolonic fistula repair June 2021) --she is recovering well.  We discussed her diet today and I have recommended that she continue an overall low residue diet.  We discussed how over the next 4 to 6 weeks hopefully she can liberalize her diet as her bowel continues to heal.  She will follow-up with Dr. Drue Flirt on 08/27/2019.  We spent considerable time today discussing medical management and the importance of resuming aggressive medical therapy for her fistulizing and stricturing Crohn's disease.  She very likely has surgical remission at this point but  understands that without treatment her Crohn's disease would likely continue to cause further complications.  She did well with Humira for a number of years but developed low level antibodies which was lowering her drug level 1 dose to every 14 days.  Given her stricturing and fistulizing disease I do think anti-TNF therapy is preferred.  I have recommended Remicade but also low-dose azathioprine to help prevent antibody formation to infliximab.   We discussed the risks, benefits and alternatives.  We discussed the risk of infection, malignancy specifically lymphoma, and have previously discussed the rare possibilities of skin rash, demyelinating disease and even heart failure.  With azathioprine we discussed about the specific risks of leukopenia, hepatotoxicity and pancreatitis.  She does have a normal TPMT enzyme activity.  --The plan will be to start Remicade with standard induction at 5 mg/kg and azathioprine 50 mg daily.  We will start to work on insurance approval but delay initiation of therapy until cleared by Dr. Drue Flirt. --I would like to see her back in 3 months  2.  B12 deficiency --she needs to continue B12 supplementation  3.  History of IDA --secondary to IBD.  She follows with hematology and received IV iron in the past.  45 minutes total spent today including patient facing time, coordination of care, reviewing medical history/procedures/pertinent radiology studies, and documentation of the encounter.

## 2019-08-23 ENCOUNTER — Telehealth: Payer: Self-pay

## 2019-08-23 NOTE — Telephone Encounter (Signed)
Referral faxed to [facility] to initiate new start Remicade  Pyrtle, Lajuan Lines, MD sent to Marlon Pel, RN New start Remicade, will need to be cleared by [facility] which I expect on 08/27/19  Standard induction followed by every 8 weeks, 5 mg/kg  Can we start the insurance approval process  I guess through [place] until [facility] infusion opens  Will also need aza 50 mg to start once she starts remicade  Thanks  JMP

## 2019-08-28 ENCOUNTER — Telehealth: Payer: Self-pay

## 2019-08-28 NOTE — Telephone Encounter (Signed)
Per Dr. Drue Flirt pt ok to start Remicade. Referral was sent  To [facility] on 7/30. Call pt in am and let her know ok to start and call to check on status of Referral.

## 2019-08-29 ENCOUNTER — Other Ambulatory Visit: Payer: Self-pay

## 2019-08-29 MED ORDER — AZATHIOPRINE 100 MG PO TABS: 50.0000 mg | ORAL_TABLET | Freq: Every day | ORAL | 6 refills | Status: DC

## 2019-08-29 NOTE — Telephone Encounter (Signed)
[place] states pts insurance will not approve Remicade, pt must get Inflectra. New plan of care and Hep b surface antigen faxed to [facility]. Imuran script sent to pharmacy. Pt sent mychart message.

## 2019-09-02 NOTE — Telephone Encounter (Signed)
Okay for bio similar infliximab

## 2019-09-03 ENCOUNTER — Encounter: Payer: Self-pay | Admitting: Family Medicine

## 2019-09-11 ENCOUNTER — Other Ambulatory Visit: Payer: Self-pay

## 2019-09-11 DIAGNOSIS — Z0000 Encounter for general adult medical examination without abnormal findings: Secondary | ICD-10-CM

## 2019-09-16 ENCOUNTER — Other Ambulatory Visit (INDEPENDENT_AMBULATORY_CARE_PROVIDER_SITE_OTHER): Payer: Commercial Managed Care - PPO

## 2019-09-16 DIAGNOSIS — Z0000 Encounter for general adult medical examination without abnormal findings: Secondary | ICD-10-CM | POA: Diagnosis not present

## 2019-09-16 LAB — COMPREHENSIVE METABOLIC PANEL
ALT: 12 U/L (ref 0–35)
AST: 10 U/L (ref 0–37)
Albumin: 4.3 g/dL (ref 3.5–5.2)
Alkaline Phosphatase: 56 U/L (ref 39–117)
BUN: 12 mg/dL (ref 6–23)
CO2: 26 mEq/L (ref 19–32)
Calcium: 9.7 mg/dL (ref 8.4–10.5)
Chloride: 104 mEq/L (ref 96–112)
Creatinine, Ser: 0.91 mg/dL (ref 0.40–1.20)
GFR: 71.62 mL/min (ref >60.00)
Glucose, Bld: 94 mg/dL (ref 70–99)
Potassium: 4 mEq/L (ref 3.5–5.1)
Sodium: 136 mEq/L (ref 135–145)
Total Bilirubin: 0.3 mg/dL (ref 0.2–1.2)
Total Protein: 7.6 g/dL (ref 6.0–8.3)

## 2019-09-16 LAB — LIPID PANEL
Cholesterol: 138 mg/dL (ref 0–200)
HDL: 52.7 mg/dL (ref >39.00)
LDL Cholesterol: 53 mg/dL (ref 0–99)
NonHDL: 85.57
Total CHOL/HDL Ratio: 3
Triglycerides: 162 mg/dL — ABNORMAL HIGH (ref 0.0–149.0)
VLDL: 32.4 mg/dL (ref 0.0–40.0)

## 2019-09-16 LAB — HEMOGLOBIN A1C: Hgb A1c MFr Bld: 5.2 % (ref 4.6–6.5)

## 2019-09-17 ENCOUNTER — Telehealth: Payer: Self-pay | Admitting: *Deleted

## 2019-09-17 NOTE — Telephone Encounter (Signed)
Called patient about form and looks like it just needed her signature.  She stated that she did not think they need it and to just fax the Health Screening form over.  Form faxed.

## 2019-09-26 ENCOUNTER — Ambulatory Visit: Payer: Commercial Managed Care - PPO | Admitting: Family

## 2019-09-26 ENCOUNTER — Other Ambulatory Visit: Payer: Commercial Managed Care - PPO

## 2019-10-25 ENCOUNTER — Inpatient Hospital Stay: Payer: Commercial Managed Care - PPO | Admitting: Family

## 2019-10-25 ENCOUNTER — Inpatient Hospital Stay: Payer: Commercial Managed Care - PPO

## 2019-11-07 ENCOUNTER — Other Ambulatory Visit: Payer: Self-pay

## 2019-11-07 ENCOUNTER — Encounter: Payer: Self-pay | Admitting: Family

## 2019-11-07 ENCOUNTER — Inpatient Hospital Stay: Payer: Commercial Managed Care - PPO | Attending: Family

## 2019-11-07 ENCOUNTER — Inpatient Hospital Stay (HOSPITAL_BASED_OUTPATIENT_CLINIC_OR_DEPARTMENT_OTHER): Payer: Commercial Managed Care - PPO | Admitting: Family

## 2019-11-07 VITALS — BP 97/83 | HR 65 | Temp 98.4°F | Resp 18 | Ht 63.0 in | Wt 130.1 lb

## 2019-11-07 DIAGNOSIS — Z79899 Other long term (current) drug therapy: Secondary | ICD-10-CM | POA: Diagnosis not present

## 2019-11-07 DIAGNOSIS — D508 Other iron deficiency anemias: Secondary | ICD-10-CM | POA: Diagnosis not present

## 2019-11-07 DIAGNOSIS — K5090 Crohn's disease, unspecified, without complications: Secondary | ICD-10-CM | POA: Diagnosis present

## 2019-11-07 DIAGNOSIS — K909 Intestinal malabsorption, unspecified: Secondary | ICD-10-CM | POA: Diagnosis not present

## 2019-11-07 DIAGNOSIS — D500 Iron deficiency anemia secondary to blood loss (chronic): Secondary | ICD-10-CM | POA: Diagnosis not present

## 2019-11-07 LAB — CBC WITH DIFFERENTIAL (CANCER CENTER ONLY)
Abs Immature Granulocytes: 0.02 10*3/uL (ref 0.00–0.07)
Basophils Absolute: 0 10*3/uL (ref 0.0–0.1)
Basophils Relative: 1 %
Eosinophils Absolute: 0.1 10*3/uL (ref 0.0–0.5)
Eosinophils Relative: 2 %
HCT: 36.7 % (ref 36.0–46.0)
Hemoglobin: 11.9 g/dL — ABNORMAL LOW (ref 12.0–15.0)
Immature Granulocytes: 0 %
Lymphocytes Relative: 50 %
Lymphs Abs: 2.7 10*3/uL (ref 0.7–4.0)
MCH: 29.2 pg (ref 26.0–34.0)
MCHC: 32.4 g/dL (ref 30.0–36.0)
MCV: 90.2 fL (ref 80.0–100.0)
Monocytes Absolute: 0.4 10*3/uL (ref 0.1–1.0)
Monocytes Relative: 8 %
Neutro Abs: 2.1 10*3/uL (ref 1.7–7.7)
Neutrophils Relative %: 39 %
Platelet Count: 220 10*3/uL (ref 150–400)
RBC: 4.07 MIL/uL (ref 3.87–5.11)
RDW: 13.1 % (ref 11.5–15.5)
WBC Count: 5.5 10*3/uL (ref 4.0–10.5)
nRBC: 0 % (ref 0.0–0.2)

## 2019-11-07 LAB — RETICULOCYTES
Immature Retic Fract: 2.7 % (ref 2.3–15.9)
RBC.: 4.07 MIL/uL (ref 3.87–5.11)
Retic Count, Absolute: 25.6 10*3/uL (ref 19.0–186.0)
Retic Ct Pct: 0.6 % (ref 0.4–3.1)

## 2019-11-07 NOTE — Progress Notes (Signed)
Hematology and Oncology Follow Up Visit  Carla Clark [id number] 10/12/1985 34 y.o. 11/07/2019   Principle Diagnosis:  Iron deficiency anemia secondary to chronic blood loss due to Crohn's disease  Current Therapy: IV iron as indicated   Interim History:  [name] is here today for follow-up. She is doing well and has no complaints at this time.  She has recently started Inflectra infusions for her Crohn's. She feels that this seems to be helping. She has not had any recent flares.  No abnormal blood loss to report. Her cycle is regular with normal flow.  No bruising or petechiae.  She has maintained a good appetite and is staying well hydrated. Her weight is stable.  She denies fever, chills, n/v, cough, rash, dizziness, SOB, chest pain, palpitations, abdominal pain or changes in bowel or bladder habits.  No swelling, tenderness, numbness or tingling in her extremities.  No falls or syncopal episodes.   ECOG Performance Status: 1 - Symptomatic but completely ambulatory  Medications:  Allergies as of 11/07/2019   No Known Allergies     Medication List       Accurate as of November 07, 2019  4:04 PM. If you have any questions, ask your nurse or doctor.        azathioprine 100 MG tablet Commonly known as: IMURAN Take 0.5 tablets (50 mg total) by mouth daily.   Misc Intestinal Flora Regulat Chew Chew by mouth every other day. Digestive Health probiotic gummies by Schiff   oxyCODONE 5 MG immediate release tablet Commonly known as: Oxy IR/ROXICODONE TAKE 1 TABLET BY MOUTH EVERY 6 HOURS AS NEEDED FOR MODERATE PAIN OR SEVERE PAIN   vitamin B-12 100 MCG tablet Commonly known as: CYANOCOBALAMIN Take 100 mcg by mouth daily.       Allergies: No Known Allergies  Past Medical History, Surgical history, Social history, and Family History were reviewed and updated.  Review of Systems: All other 10 point review of systems is negative.   Physical Exam:  vitals  were not taken for this visit.   Wt Readings from Last 3 Encounters:  08/20/19 121 lb 8 oz (55.1 kg)  07/25/19 120 lb 8 oz (54.7 kg)  07/01/19 126 lb (57.2 kg)    Ocular: Sclerae unicteric, pupils equal, round and reactive to light Ear-nose-throat: Oropharynx clear, dentition fair Lymphatic: No cervical or supraclavicular adenopathy Lungs no rales or rhonchi, good excursion bilaterally Heart regular rate and rhythm, no murmur appreciated Abd soft, nontender, positive bowel sounds MSK no focal spinal tenderness, no joint edema Neuro: non-focal, well-oriented, appropriate affect Breasts: Deferred   Lab Results  Component Value Date   WBC 5.5 11/07/2019   HGB 11.9 (L) 11/07/2019   HCT 36.7 11/07/2019   MCV 90.2 11/07/2019   PLT 220 11/07/2019   Lab Results  Component Value Date   FERRITIN 220 07/25/2019   IRON 43 07/25/2019   TIBC 331 07/25/2019   UIBC 289 07/25/2019   IRONPCTSAT 13 (L) 07/25/2019   Lab Results  Component Value Date   RETICCTPCT 0.6 11/07/2019   RBC 4.07 11/07/2019   RBC 4.07 11/07/2019   RETICCTABS 13.7 (L) 01/20/2015   No results found for: KPAFRELGTCHN, LAMBDASER, KAPLAMBRATIO Lab Results  Component Value Date   IGA 187 03/11/2016   No results found for: Ronnald Ramp, A1GS, A2GS, Violet Baldy, MSPIKE, SPEI   Chemistry      Component Value Date/Time   NA 136 09/16/2019 1628   K 4.0 09/16/2019 1628  CL 104 09/16/2019 1628   CO2 26 09/16/2019 1628   BUN 12 09/16/2019 1628   CREATININE 0.91 09/16/2019 1628   CREATININE 0.62 08/20/2012 1139      Component Value Date/Time   CALCIUM 9.7 09/16/2019 1628   ALKPHOS 56 09/16/2019 1628   AST 10 09/16/2019 1628   ALT 12 09/16/2019 1628   BILITOT 0.3 09/16/2019 1628       Impression and Plan: Carla Clark is a very pleasant 34 yo caucasian female with history of iron deficiency anemia secondary to 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 We  will see what her iron studies look like and replace if needed.  Follow-up in 3 months.  She can contact our office with any questions or concerns.   Carla Peace, NP 10/14/20214:04 PM

## 2019-11-08 ENCOUNTER — Other Ambulatory Visit: Payer: Commercial Managed Care - PPO

## 2019-11-08 ENCOUNTER — Telehealth: Payer: Self-pay | Admitting: Family

## 2019-11-08 ENCOUNTER — Ambulatory Visit: Payer: Commercial Managed Care - PPO | Admitting: Family

## 2019-11-08 LAB — IRON AND TIBC
Iron: 64 ug/dL (ref 41–142)
Saturation Ratios: 27 % (ref 21–57)
TIBC: 240 ug/dL (ref 236–444)
UIBC: 176 ug/dL (ref 120–384)

## 2019-11-08 LAB — FERRITIN: Ferritin: 183 ng/mL (ref 11–307)

## 2019-11-08 NOTE — Telephone Encounter (Signed)
Appointments scheduled patient has My Chart Access for appointments per 10/14 los

## 2020-01-15 LAB — OB RESULTS CONSOLE GC/CHLAMYDIA
Chlamydia: NEGATIVE
Gonorrhea: NEGATIVE

## 2020-01-16 LAB — OB RESULTS CONSOLE HEPATITIS B SURFACE ANTIGEN: Hepatitis B Surface Ag: NEGATIVE

## 2020-01-16 LAB — OB RESULTS CONSOLE VARICELLA ZOSTER ANTIBODY, IGG: Varicella: IMMUNE

## 2020-01-16 LAB — OB RESULTS CONSOLE RUBELLA ANTIBODY, IGM: Rubella: IMMUNE

## 2020-01-25 NOTE — L&D Delivery Note (Signed)
Delivery Note She progressed to complete and pushed well for about 45 minutes.  At 5:28 PM a viable female was delivered via Vaginal, Spontaneous (Presentation: Right Occiput Anterior).  APGAR: 9, 9; weight pending.   Placenta status: Spontaneous, Intact.  Cord: 3 vessels with the following complications: None.   Anesthesia: Epidural Episiotomy: Median-done due to tight band of scar tissue from first delivery Lacerations: None Suture Repair: 2.0 3.0 vicryl rapide Est. Blood Loss (mL):  400  Mom to postpartum.  Baby to Couplet care / Skin to Skin.  Carla Clark 08/15/2020, 5:58 PM

## 2020-02-07 ENCOUNTER — Other Ambulatory Visit: Payer: Self-pay

## 2020-02-07 ENCOUNTER — Inpatient Hospital Stay: Payer: Commercial Managed Care - PPO | Attending: Family

## 2020-02-07 ENCOUNTER — Inpatient Hospital Stay (HOSPITAL_BASED_OUTPATIENT_CLINIC_OR_DEPARTMENT_OTHER): Payer: Commercial Managed Care - PPO | Admitting: Family

## 2020-02-07 ENCOUNTER — Telehealth: Payer: Self-pay

## 2020-02-07 VITALS — BP 123/84 | HR 95 | Resp 18 | Wt 134.0 lb

## 2020-02-07 DIAGNOSIS — D500 Iron deficiency anemia secondary to blood loss (chronic): Secondary | ICD-10-CM | POA: Diagnosis not present

## 2020-02-07 DIAGNOSIS — K50919 Crohn's disease, unspecified, with unspecified complications: Secondary | ICD-10-CM | POA: Diagnosis present

## 2020-02-07 DIAGNOSIS — O99011 Anemia complicating pregnancy, first trimester: Secondary | ICD-10-CM | POA: Insufficient documentation

## 2020-02-07 DIAGNOSIS — K909 Intestinal malabsorption, unspecified: Secondary | ICD-10-CM

## 2020-02-07 DIAGNOSIS — Z79899 Other long term (current) drug therapy: Secondary | ICD-10-CM | POA: Diagnosis not present

## 2020-02-07 LAB — IRON AND TIBC
Iron: 77 ug/dL (ref 28–170)
Saturation Ratios: 22 % (ref 10.4–31.8)
TIBC: 350 ug/dL (ref 250–450)
UIBC: 273 ug/dL

## 2020-02-07 LAB — CBC WITH DIFFERENTIAL (CANCER CENTER ONLY)
Abs Immature Granulocytes: 0.13 10*3/uL — ABNORMAL HIGH (ref 0.00–0.07)
Basophils Absolute: 0 10*3/uL (ref 0.0–0.1)
Basophils Relative: 0 %
Eosinophils Absolute: 0.3 10*3/uL (ref 0.0–0.5)
Eosinophils Relative: 3 %
HCT: 34.8 % — ABNORMAL LOW (ref 36.0–46.0)
Hemoglobin: 11.8 g/dL — ABNORMAL LOW (ref 12.0–15.0)
Immature Granulocytes: 1 %
Lymphocytes Relative: 28 %
Lymphs Abs: 2.7 10*3/uL (ref 0.7–4.0)
MCH: 30 pg (ref 26.0–34.0)
MCHC: 33.9 g/dL (ref 30.0–36.0)
MCV: 88.5 fL (ref 80.0–100.0)
Monocytes Absolute: 0.5 10*3/uL (ref 0.1–1.0)
Monocytes Relative: 6 %
Neutro Abs: 6.1 10*3/uL (ref 1.7–7.7)
Neutrophils Relative %: 62 %
Platelet Count: 227 10*3/uL (ref 150–400)
RBC: 3.93 MIL/uL (ref 3.87–5.11)
RDW: 12.3 % (ref 11.5–15.5)
WBC Count: 9.8 10*3/uL (ref 4.0–10.5)
nRBC: 0 % (ref 0.0–0.2)

## 2020-02-07 LAB — RETICULOCYTES
Immature Retic Fract: 4.9 % (ref 2.3–15.9)
RBC.: 3.9 MIL/uL (ref 3.87–5.11)
Retic Count, Absolute: 42.5 10*3/uL (ref 19.0–186.0)
Retic Ct Pct: 1.1 % (ref 0.4–3.1)

## 2020-02-07 LAB — FERRITIN: Ferritin: 151 ng/mL (ref 11–307)

## 2020-02-07 NOTE — Telephone Encounter (Signed)
appts made per los and pt will view on my chart per her req   aom

## 2020-02-07 NOTE — Progress Notes (Signed)
Hematology and Oncology Follow Up Visit  Carla Clark [id number] 10-19-1985 35 y.o. 02/07/2020   Principle Diagnosis:  Iron deficiency anemia secondary to chronic blood loss due to Crohn's disease  Current Therapy: IV iron as indicated   Interim History:  Carla Clark is here today for follow-up. She is now [redacted] weeks pregnant! Due date is 08/14/2020. OB is Cheri Fowler, MD [phone]. She notes some fatigue and get a little winded carrying her little boy upstairs.  She feels that the her morning sickness is much better.  She has had some constipation and will note a little blood in her stool sometimes due to straining. No other blood loss noted. No abnormal bruising, no petechiae.  She states that she will continue getting Inflectra injections for her Crohn's until a week before she is due.  No fever, chills, cough, rash, dizziness, chest pain, palpitations, abdominal pain or changes in bladder habits.  No swelling, tenderness, numbness or tingling in her extremities.  No falls or syncope.  She has maintained a good appetite and is staying well hydrated. Her weight is stable.   ECOG Performance Status: 1 - Symptomatic but completely ambulatory  Medications:  Allergies as of 02/07/2020   No Known Allergies     Medication List       Accurate as of February 07, 2020  3:20 PM. If you have any questions, ask your nurse or doctor.        azathioprine 100 MG tablet Commonly known as: IMURAN Take 0.5 tablets (50 mg total) by mouth daily.   Misc Intestinal Flora Regulat Chew Chew by mouth every other day. Digestive Health probiotic gummies by Schiff   vitamin B-12 100 MCG tablet Commonly known as: CYANOCOBALAMIN Take 100 mcg by mouth daily.       Allergies: No Known Allergies  Past Medical History, Surgical history, Social history, and Family History were reviewed and updated.  Review of Systems: All other 10 point review of systems is negative.   Physical  Exam:  weight is 134 lb (60.8 kg). Her blood pressure is 123/84 and her pulse is 95. Her respiration is 18 and oxygen saturation is 100%.   Wt Readings from Last 3 Encounters:  02/07/20 134 lb (60.8 kg)  11/07/19 130 lb 1.9 oz (59 kg)  08/20/19 121 lb 8 oz (55.1 kg)    Ocular: Sclerae unicteric, pupils equal, round and reactive to light Ear-nose-throat: Oropharynx clear, dentition fair Lymphatic: No cervical or supraclavicular adenopathy Lungs no rales or rhonchi, good excursion bilaterally Heart regular rate and rhythm, no murmur appreciated Abd soft, nontender, positive bowel sounds MSK no focal spinal tenderness, no joint edema Neuro: non-focal, well-oriented, appropriate affect Breasts: Deferred   Lab Results  Component Value Date   WBC 9.8 02/07/2020   HGB 11.8 (L) 02/07/2020   HCT 34.8 (L) 02/07/2020   MCV 88.5 02/07/2020   PLT 227 02/07/2020   Lab Results  Component Value Date   FERRITIN 183 11/07/2019   IRON 64 11/07/2019   TIBC 240 11/07/2019   UIBC 176 11/07/2019   IRONPCTSAT 27 11/07/2019   Lab Results  Component Value Date   RETICCTPCT 1.1 02/07/2020   RBC 3.90 02/07/2020   RETICCTABS 13.7 (L) 01/20/2015   No results found for: KPAFRELGTCHN, LAMBDASER, KAPLAMBRATIO Lab Results  Component Value Date   IGA 187 03/11/2016   No results found for: TOTALPROTELP, ALBUMINELP, A1GS, A2GS, BETS, BETA2SER, GAMS, MSPIKE, SPEI   Chemistry      Component Value  Date/Time   NA 136 09/16/2019 1628   K 4.0 09/16/2019 1628   CL 104 09/16/2019 1628   CO2 26 09/16/2019 1628   BUN 12 09/16/2019 1628   CREATININE 0.91 09/16/2019 1628   CREATININE 0.62 08/20/2012 1139      Component Value Date/Time   CALCIUM 9.7 09/16/2019 1628   ALKPHOS 56 09/16/2019 1628   AST 10 09/16/2019 1628   ALT 12 09/16/2019 1628   BILITOT 0.3 09/16/2019 1628       Impression and Plan: Carla Clark is a very pleasant 35 yo caucasian female with history of iron deficiency anemia secondary  to 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 Iron studies are pending.  Follow-up in 3 months.  She can contact our office with any questions or concerns.   [name], NP 1/14/20223:20 PM

## 2020-02-25 ENCOUNTER — Telehealth: Payer: Self-pay | Admitting: Internal Medicine

## 2020-02-25 NOTE — Telephone Encounter (Signed)
OV note faxed via epic.

## 2020-02-26 ENCOUNTER — Other Ambulatory Visit: Payer: Commercial Managed Care - PPO

## 2020-02-26 ENCOUNTER — Encounter: Payer: Self-pay | Admitting: Internal Medicine

## 2020-02-26 ENCOUNTER — Other Ambulatory Visit: Payer: Self-pay

## 2020-02-26 ENCOUNTER — Ambulatory Visit (INDEPENDENT_AMBULATORY_CARE_PROVIDER_SITE_OTHER): Payer: Commercial Managed Care - PPO | Admitting: Internal Medicine

## 2020-02-26 VITALS — BP 112/58 | HR 92 | Ht 63.0 in | Wt 139.0 lb

## 2020-02-26 DIAGNOSIS — K50013 Crohn's disease of small intestine with fistula: Secondary | ICD-10-CM

## 2020-02-26 DIAGNOSIS — E538 Deficiency of other specified B group vitamins: Secondary | ICD-10-CM

## 2020-02-26 DIAGNOSIS — Z3A15 15 weeks gestation of pregnancy: Secondary | ICD-10-CM

## 2020-02-26 DIAGNOSIS — Z79899 Other long term (current) drug therapy: Secondary | ICD-10-CM | POA: Diagnosis not present

## 2020-02-26 DIAGNOSIS — D500 Iron deficiency anemia secondary to blood loss (chronic): Secondary | ICD-10-CM

## 2020-02-26 NOTE — Patient Instructions (Addendum)
CONGRATULATIONS on your pregnancy!  Your provider has requested that you go to the basement level for lab work before leaving today. Press "B" on the elevator. The lab is located at the first door on the left as you exit the elevator.  Please follow up with Dr Hilarie Fredrickson in August 2022.  Please get your Inflectra infused between week 32-34 of pregnancy. Let's also infuse Inflectra 1-2 weeks after giving birth.  If you are age 33 or older, your body mass index should be between 23-30. Your Body mass index is 24.62 kg/m. If this is out of the aforementioned range listed, please consider follow up with your Primary Care Provider.  If you are age 64 or younger, your body mass index should be between 19-25. Your Body mass index is 24.62 kg/m. If this is out of the aformentioned range listed, please consider follow up with your Primary Care Provider.   Due to recent changes in healthcare laws, you may see the results of your imaging and laboratory studies on MyChart before your provider has had a chance to review them.  We understand that in some cases there may be results that are confusing or concerning to you. Not all laboratory results come back in the same time frame and the provider may be waiting for multiple results in order to interpret others.  Please give [place] 48 hours in order for your provider to thoroughly review all the results before contacting the office for clarification of your results.

## 2020-02-26 NOTE — Progress Notes (Signed)
Subjective:    Patient ID: Carla Clark, female    DOB: 07-24-1985, 35 y.o.   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  She was last seen in the office on 08/20/2019.  After clearance from Dr. Drue Flirt she started bio similar infliximab and low-dose azathioprine (previous Ab to Humira).  She began infliximab in August 2021.  She reports that she is now [redacted] weeks pregnant with her second child.  This pregnancy thus far has been much easier and she has had less issues with abdominal pain and discomfort.  During her first trimester of her first pregnancy she lost 17 pounds.  She has not had issues with weight loss.  She is feeling slightly constipated since becoming pregnant.  She has started a prenatal vitamin but without iron.  She feels occasional abdominal pressure but no abdominal pain like she was feeling prior to her surgery.  She has continued infliximab but her obstetrician asked her to stop her azathioprine.  This was stopped when she became pregnant.  She noticed with azathioprine that perhaps her stools were slightly loose but she was not having significant nausea.  No blood in her stool or melena.  She last received infliximab on 01/09/2020 with next dose planned for 03/07/2020.  Her due date is 08/14/2020.  She is planning for vaginal delivery which was successful with her first child.  Review of Systems As per HPI, otherwise negative  Current Medications, Allergies, Past Medical History, Past Surgical History, Family History and Social History were reviewed in [facility] record.     Objective:   Physical Exam BP (!) 112/58   Pulse 92   Ht 5' 3"  (1.6 m)   Wt 139 lb (63 kg)   LMP 05/11/2019   SpO2 99%   BMI 24.62 kg/m  Gen: awake, alert, NAD HEENT: anicteric, op clear CV: RRR, no mrg Pulm: CTA b/l Abd: soft, gravid uterus palpable above the pubic symphysis, NT/ND, +BS throughout Ext: no c/c/e Neuro: nonfocal  CBC    Component Value Date/Time   WBC 9.8 02/07/2020 1446   WBC 7.2 12/26/2018 1610   RBC 3.90 02/07/2020 1447   RBC 3.93 02/07/2020 1446   HGB 11.8 (L) 02/07/2020 1446   HGB 12.0 01/11/2017 1514   HCT 34.8 (L) 02/07/2020 1446   HCT 36.7 01/11/2017 1514   PLT 227 02/07/2020 1446   PLT 313 01/11/2017 1514   MCV 88.5 02/07/2020 1446   MCV 89 01/11/2017 1514   MCH 30.0 02/07/2020 1446   MCHC 33.9 02/07/2020 1446   RDW 12.3 02/07/2020 1446   RDW 12.7 01/11/2017 1514   LYMPHSABS 2.7 02/07/2020 1446   LYMPHSABS 2.9 01/11/2017 1514   MONOABS 0.5 02/07/2020 1446   EOSABS 0.3 02/07/2020 1446   EOSABS 0.2 01/11/2017 1514   BASOSABS 0.0 02/07/2020 1446   BASOSABS 0.0 01/11/2017 1514    Iron/TIBC/Ferritin/ %Sat    Component Value Date/Time   IRON 77 02/07/2020 1447   IRON 70 01/11/2017 1514   TIBC 350 02/07/2020 1447   TIBC 299 01/11/2017 1514   FERRITIN 151 02/07/2020 1447   FERRITIN  25 01/11/2017 1514   IRONPCTSAT 22 02/07/2020 1447   IRONPCTSAT 23 01/11/2017 1514        Assessment & Plan:  35 year old female with a history of fistulizing and stricturing ileal Crohn's disease 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   1.  Fistulizing and stricturing ileal Crohn's disease (right hemicolectomy with enterocolonic fistula  repair June 2021) --she began bio similar infliximab therapy in August 2021.  She was taking low-dose azathioprine but was recommended to have this stopped by OB.  She is doing very well with the infusions and there is been no evidence for infusion related reaction or complication.  She has not had abdominal pain, diarrhea or obstructive symptoms.  It is very likely that her Crohn's disease remains well controlled though we have not imaged since her surgery.  We discussed her pregnancy today as well as treatment of IBD during pregnancy.  Her Crohn's disease has been aggressive with prior fistula and stricturing.  The risk of stopping Crohn's therapy is higher than the risk to her unborn child.  The data supports this recommendation.  While azathioprine has not been associated with birth defects or pregnancy complications, her obstetrician is recommended that she be without this.  I will defer to his recommendation though there is some risk for antibody formation to infliximab as this happened previously with adalimumab.  I have recommended the following: --Continue infliximab uninterrupted every 8 weeks; I would like her to slightly adjust the coming infusions so that she receives an infusion around week 32 of her pregnancy.  This will allow for repeat dosing just after delivery.  Studies do show that the postpartum period is high risk for flare and so I would like her to have a dose of infliximab just after she delivers.   --Infliximab will be present in neonatal blood, live vaccine should be delayed for 9 months.  Nonlive vaccines for her newborn child should proceed as recommended and scheduled by pediatrics --Resume azathioprine at low-dose, 50 mg daily, post partum --Follow-up with me around August 2022, consider infliximab trough level at that time  2.  B12 deficiency --continue supplemental B12  3.  IDA --secondary to IBD.  Iron levels have stabilized without the need for frequent supplementation  since her surgery.  This is expected in good news for her.  She will continue to follow with hematology.  IV iron as needed  40 minutes total spent today including patient facing time, coordination of care, reviewing medical history/procedures/pertinent radiology studies, and documentation of the encounter.

## 2020-02-28 LAB — QUANTIFERON-TB GOLD PLUS
Mitogen-NIL: >10 IU/mL
NIL: 0.29 IU/mL
QuantiFERON-TB Gold Plus: NEGATIVE
TB1-NIL: <0 IU/mL
TB2-NIL: 0.02 IU/mL

## 2020-05-11 ENCOUNTER — Telehealth: Payer: Self-pay

## 2020-05-11 ENCOUNTER — Encounter: Payer: Self-pay | Admitting: Family

## 2020-05-11 ENCOUNTER — Other Ambulatory Visit: Payer: Self-pay

## 2020-05-11 ENCOUNTER — Inpatient Hospital Stay (HOSPITAL_BASED_OUTPATIENT_CLINIC_OR_DEPARTMENT_OTHER): Payer: Commercial Managed Care - PPO | Admitting: Family

## 2020-05-11 ENCOUNTER — Inpatient Hospital Stay: Payer: Commercial Managed Care - PPO | Attending: Family

## 2020-05-11 VITALS — BP 106/58 | HR 80 | Temp 98.5°F | Resp 18 | Ht 63.0 in | Wt 160.1 lb

## 2020-05-11 DIAGNOSIS — D500 Iron deficiency anemia secondary to blood loss (chronic): Secondary | ICD-10-CM | POA: Diagnosis not present

## 2020-05-11 DIAGNOSIS — K909 Intestinal malabsorption, unspecified: Secondary | ICD-10-CM | POA: Insufficient documentation

## 2020-05-11 DIAGNOSIS — O99012 Anemia complicating pregnancy, second trimester: Secondary | ICD-10-CM | POA: Insufficient documentation

## 2020-05-11 DIAGNOSIS — K50919 Crohn's disease, unspecified, with unspecified complications: Secondary | ICD-10-CM | POA: Diagnosis present

## 2020-05-11 DIAGNOSIS — Z79899 Other long term (current) drug therapy: Secondary | ICD-10-CM | POA: Insufficient documentation

## 2020-05-11 LAB — RETICULOCYTES
Immature Retic Fract: 18.6 % — ABNORMAL HIGH (ref 2.3–15.9)
RBC.: 3.92 MIL/uL (ref 3.87–5.11)
Retic Count, Absolute: 70.2 10*3/uL (ref 19.0–186.0)
Retic Ct Pct: 1.8 % (ref 0.4–3.1)

## 2020-05-11 LAB — CBC WITH DIFFERENTIAL (CANCER CENTER ONLY)
Abs Immature Granulocytes: 0.14 10*3/uL — ABNORMAL HIGH (ref 0.00–0.07)
Basophils Absolute: 0.1 10*3/uL (ref 0.0–0.1)
Basophils Relative: 1 %
Eosinophils Absolute: 0.1 10*3/uL (ref 0.0–0.5)
Eosinophils Relative: 1 %
HCT: 35.5 % — ABNORMAL LOW (ref 36.0–46.0)
Hemoglobin: 11.7 g/dL — ABNORMAL LOW (ref 12.0–15.0)
Immature Granulocytes: 2 %
Lymphocytes Relative: 42 %
Lymphs Abs: 3.3 10*3/uL (ref 0.7–4.0)
MCH: 30 pg (ref 26.0–34.0)
MCHC: 33 g/dL (ref 30.0–36.0)
MCV: 91 fL (ref 80.0–100.0)
Monocytes Absolute: 0.7 10*3/uL (ref 0.1–1.0)
Monocytes Relative: 9 %
Neutro Abs: 3.6 10*3/uL (ref 1.7–7.7)
Neutrophils Relative %: 45 %
Platelet Count: 188 10*3/uL (ref 150–400)
RBC: 3.9 MIL/uL (ref 3.87–5.11)
RDW: 12.9 % (ref 11.5–15.5)
WBC Count: 7.9 10*3/uL (ref 4.0–10.5)
nRBC: 0 % (ref 0.0–0.2)

## 2020-05-11 LAB — IRON AND TIBC
Iron: 85 ug/dL (ref 41–142)
Saturation Ratios: 20 % — ABNORMAL LOW (ref 21–57)
TIBC: 423 ug/dL (ref 236–444)
UIBC: 337 ug/dL (ref 120–384)

## 2020-05-11 LAB — FERRITIN: Ferritin: 98 ng/mL (ref 11–307)

## 2020-05-11 NOTE — Progress Notes (Signed)
Hematology and Oncology Follow Up Visit  Carla Clark [id number] 1985-10-12 34 y.o. 05/11/2020   Principle Diagnosis:  Iron deficiency anemia secondary to chronic blood loss due to Crohn's disease Pregnant - due 08/14/2020  Current Therapy: IV iron as indicated   Interim History:  Carla Clark is here today for follow-up. She states that her pregnancy is going well. She is due to have a little girl in July.  She has some mild fatigue at times.  She was having nose bleeds and was able to see ENT who said this was due to superficial blood vessels in the nose and dry air.  No other blood loss noted. No bruising or petechiae.  No fever, chills, n/v, cough, dizziness, SOB, chest pain, palpitations, abdominal pain or changes in bowel or bladder habits.  She states that she will continue getting Inflectra injections for her Crohn's until a week before she is due.  No swelling, tenderness, numbness or tingling in her extremities.  No falls or syncope.  She has maintained a good appetite and is staying well hydrated.   ECOG Performance Status: 1 - Symptomatic but completely ambulatory  Medications:  Allergies as of 05/11/2020   No Known Allergies     Medication List       Accurate as of May 11, 2020 11:03 AM. If you have any questions, ask your nurse or doctor.        acetic acid-hydrocortisone OTIC solution Commonly known as: VOSOL-HC Place 2 drops into the right ear in the morning and at bedtime. X 14 days   azathioprine 100 MG tablet Commonly known as: IMURAN Take 0.5 tablets (50 mg total) by mouth daily.   inFLIXimab 100 MG injection Commonly known as: REMICADE Inject into the vein every 8 (eight) weeks.   Misc Intestinal Flora Regulat Chew Chew by mouth every other day. Digestive Health probiotic gummies by [employer]   PRENATAL VITAMIN PO Take 4 tablets by mouth daily.   vitamin B-12 100 MCG tablet Commonly known as: CYANOCOBALAMIN Take 100 mcg by mouth  daily.       Allergies: No Known Allergies  Past Medical History, Surgical history, Social history, and Family History were reviewed and updated.  Review of Systems: All other 10 point review of systems is negative.   Physical Exam:  height is 5' 3"  (1.6 m) and weight is 160 lb 1.9 oz (72.6 kg). Her oral temperature is 98.5 F (36.9 C). Her blood pressure is 106/58 (abnormal) and her pulse is 80. Her respiration is 18 and oxygen saturation is 100%.   Wt Readings from Last 3 Encounters:  05/11/20 160 lb 1.9 oz (72.6 kg)  02/26/20 139 lb (63 kg)  02/07/20 134 lb (60.8 kg)    Ocular: Sclerae unicteric, pupils equal, round and reactive to light Ear-nose-throat: Oropharynx clear, dentition fair Lymphatic: No cervical or supraclavicular adenopathy Lungs no rales or rhonchi, good excursion bilaterally Heart regular rate and rhythm, no murmur appreciated Abd soft, nontender, positive bowel sounds MSK no focal spinal tenderness, no joint edema Neuro: non-focal, well-oriented, appropriate affect Breasts: Deferred   Lab Results  Component Value Date   WBC 7.9 05/11/2020   HGB 11.7 (L) 05/11/2020   HCT 35.5 (L) 05/11/2020   MCV 91.0 05/11/2020   PLT 188 05/11/2020   Lab Results  Component Value Date   FERRITIN 151 02/07/2020   IRON 77 02/07/2020   TIBC 350 02/07/2020   UIBC 273 02/07/2020   IRONPCTSAT 22 02/07/2020   Lab  Results  Component Value Date   RETICCTPCT 1.8 05/11/2020   RBC 3.90 05/11/2020   RBC 3.92 05/11/2020   RETICCTABS 13.7 (L) 01/20/2015   No results found for: KPAFRELGTCHN, LAMBDASER, [facility] Lab Results  Component Value Date   IGA 187 03/11/2016   No results found for: Ronnald Ramp, A1GS, Nelida Meuse, SPEI   Chemistry      Component Value Date/Time   NA 136 09/16/2019 1628   K 4.0 09/16/2019 1628   CL 104 09/16/2019 1628   CO2 26 09/16/2019 1628   BUN 12 09/16/2019 1628   CREATININE 0.91 09/16/2019 1628    CREATININE 0.62 08/20/2012 1139      Component Value Date/Time   CALCIUM 9.7 09/16/2019 1628   ALKPHOS 56 09/16/2019 1628   AST 10 09/16/2019 1628   ALT 12 09/16/2019 1628   BILITOT 0.3 09/16/2019 1628       Impression and Plan: Carla Clark is a very pleasant 35yo caucasian female with history of iron deficiency anemia secondary to 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 Iron studies are pending.  Follow-up in 3 months. We will see her sooner if needed. She will be due to deliver around that time so may also need to be moved out. She will let us know if any changes need to be made.   [name], NP 4/18/202211:03 AM

## 2020-05-11 NOTE — Telephone Encounter (Signed)
appts made per verbal los, pt req to view in my chart and aware that if order differs we will giver her a call  Cuong Moorman

## 2020-05-20 LAB — OB RESULTS CONSOLE HIV ANTIBODY (ROUTINE TESTING): HIV: NONREACTIVE

## 2020-06-08 ENCOUNTER — Telehealth: Payer: Self-pay | Admitting: Internal Medicine

## 2020-06-08 NOTE — Telephone Encounter (Signed)
Patient called is requesting we call [facility] to request she get scheduled sooner for her infusions.

## 2020-06-09 NOTE — Telephone Encounter (Signed)
Left message for patient to call back  

## 2020-06-15 NOTE — Telephone Encounter (Signed)
Yes, ok to infuse early and then place next infusion as soon after delivery as possible (assuming also ok with patient and her OB provider) Thanks JMP

## 2020-06-15 NOTE — Telephone Encounter (Signed)
Pt is [redacted] weeks pregnant and her OB wants her to have the Inflectra infusion a little early. She would like to have it done next week which would be 6 weeks from her last infusion. If ok need to call [facility] with order change. Please advise.

## 2020-06-15 NOTE — Telephone Encounter (Signed)
[employer] and gave verbal order for Inflectra to be given next week. They will check with insurance to make sure this will be covered and then notify pt to set up the appt. Pt aware.

## 2020-07-21 ENCOUNTER — Other Ambulatory Visit: Payer: Self-pay

## 2020-07-21 ENCOUNTER — Emergency Department (INDEPENDENT_AMBULATORY_CARE_PROVIDER_SITE_OTHER)
Admission: EM | Admit: 2020-07-21 | Discharge: 2020-07-21 | Disposition: A | Discharge status: Home / Self Care | Payer: Commercial Managed Care - PPO | Source: Home / Self Care | Attending: Family Medicine | Admitting: Family Medicine

## 2020-07-21 ENCOUNTER — Telehealth: Payer: Self-pay | Admitting: *Deleted

## 2020-07-21 DIAGNOSIS — H608X3 Other otitis externa, bilateral: Secondary | ICD-10-CM

## 2020-07-21 MED ORDER — NEOMYCIN-POLYMYXIN-HC 3.5-10000-1 OT SUSP: 4.0000 [drp] | Freq: Three times a day (TID) | OTIC | 0 refills | Status: DC

## 2020-07-21 NOTE — Telephone Encounter (Signed)
Who Is Calling Patient / Member / Family / Caregiver Caller Name Female Iafrate Caller Phone Number [phone] Patient Name Carla Clark Patient DOB 1985-03-17 Call Type Message Only Information Provided Reason for Call Request to Schedule Office Appointment Initial Comment Caller states that she would like to schedule an appointment with any of the MDs today. She has hearing loss out of her left ear and believes she needs to be seen today. Patient request to speak to RN No Additional Comment Caller declined triage and would just like a call back this morning to make an appointment for today to have her ear checked. She did also want to mention that she is [redacted] weeks pregnant in case the office needs to know. She is okay with seeing any of the providers as long as she can be seen today. Disp. Time Disposition Final User 07/21/2020 8:00:21 AM General Information Provided Yes Wynema Birch

## 2020-07-21 NOTE — ED Provider Notes (Signed)
[facility]    CSN: [id number] Arrival date & time: 07/21/20  5361      History   Chief Complaint Chief Complaint  Patient presents with   Otalgia    HPI Carla Clark is a 35 y.o. female.   HPI  Fairly healthy 35 year old.  She is [redacted] weeks pregnant.  She is here for left ear pain.  Also decreased hearing.  It has been coming on for a few days.  She has known eczema in both exterior ear canals.  She has VoSoL to use as needed.  She states right now when she uses it it burns.  She also uses plain cortisone cream on the exterior ear.  No cold symptoms runny nose sore throat or fever. Patient's pregnancy is going well.  She is due at the end of July Patient has Crohn's disease.  This is currently in remission.  Past Medical History:  Diagnosis Date   Anal fissure    Anemia 11/2011   iron deficiency. 01/2013 parenteral iron. Intranasal B12.  Dr Marin Olp follows.    B12 deficiency 11/2012   Breast mass in female 04/21/2012   Cervical cancer screening 01/27/2012   Chicken pox as a child   Crohn's disease ([place]) 2013   ileitis 10/2011. ileitis, mesenteric abscess, ? entero-enteric fistula on 08/2012 CT enterography   Dermatitis 08/22/2016   GERD (gastroesophageal reflux disease)    Hepatitis B immune 07/2012   previous vaccination.    Hives 09/2014   per Dermatologist.  Rx Zyrtec, Benadryl.    Ileitis    Serrated polyp of colon    Small bowel obstruction (HCC)    SVD (spontaneous vaginal delivery) 08/13/2018   Vitamin D deficiency 2014    Patient Active Problem List   Diagnosis Date Noted   SVD (spontaneous vaginal delivery) 08/13/2018   Indication for care in labor or delivery 08/12/2018   Amenorrhea 08/24/2016   Dermatitis 08/22/2016   B12 deficiency 08/18/2016   History of colon polyps 08/18/2016   History of small bowel obstruction 11/20/2015   Eczema 11/05/2013   Gastroesophageal reflux disease without esophagitis 09/10/2013   Other iron deficiency anemia  01/21/2013   Crohn's disease ([place]) 08/07/2012   Breast mass in female 04/21/2012   Cervical cancer screening 01/27/2012   Preventative health care 11/06/2011    Past Surgical History:  Procedure Laterality Date   LAPAROSCOPIC RIGHT HEMI COLECTOMY  07/11/2019   WISDOM TOOTH EXTRACTION  35 yrs old    OB History     Gravida  2   Para  1   Term  1   Preterm  0   AB  0   Living  1      SAB  0   IAB  0   Ectopic  0   Multiple  0   Live Births  1            Home Medications    Prior to Admission medications   Medication Sig Start Date End Date Taking? Authorizing Provider  neomycin-polymyxin-hydrocortisone (CORTISPORIN) 3.5-10000-1 OTIC suspension Place 4 drops into the left ear 3 (three) times daily. 07/21/20  Yes Raylene Everts, MD  inFLIXimab (REMICADE) 100 MG injection Inject into the vein every 8 (eight) weeks.    [provider]  Prenatal Vit-Fe Fumarate-FA (PRENATAL VITAMIN PO) Take 4 tablets by mouth daily.    [provider]  vitamin B-12 (CYANOCOBALAMIN) 100 MCG tablet Take 100 mcg by mouth daily.  [provider]    Family History Family History  Problem Relation Age of Onset   Nephrolithiasis Father    Lung cancer Maternal Grandmother    Gout Maternal Grandfather    Heart attack Maternal Grandfather    Ovarian cancer Paternal Grandmother    Cancer Paternal Grandmother 68       ovarian   Pancreatic cancer Paternal Grandfather    Ovarian cancer Paternal Aunt        ovarian   Breast cancer Paternal Aunt    Cancer Paternal Aunt        breast (2019) and ovarian cancer (2005)   Other Paternal Uncle 31       cyst removed from pancreas   Pancreatic cancer Paternal Uncle        Stage 1   Cancer Paternal Uncle        pancreatic   Colon cancer Neg Hx    Stomach cancer Neg Hx    Esophageal cancer Neg Hx     Social History Social History   Tobacco Use   Smoking status: Never   Smokeless tobacco: Never    Tobacco comments:    never used tobacco  Vaping Use   Vaping Use: Never used  Substance Use Topics   Alcohol use: Not Currently    Alcohol/week: 0.0 standard drinks    Comment: occasionaly   Drug use: No     Allergies   Patient has no known allergies.   Review of Systems Review of Systems See HPI  Physical Exam Triage Vital Signs ED Triage Vitals  Enc Vitals Group     BP 07/21/20 1009 118/82     Pulse Rate 07/21/20 1009 (!) 110     Resp 07/21/20 1009 20     Temp 07/21/20 1009 98.2 F (36.8 C)     Temp Source 07/21/20 1009 Oral     SpO2 07/21/20 1009 99 %     Weight 07/21/20 1010 170 lb (77.1 kg)     Height 07/21/20 1010 5' 3"  (1.6 m)     Head Circumference --      Peak Flow --      Pain Score 07/21/20 1010 6     Pain Loc --      Pain Edu? --      Excl. in [place]? --    No data found.  Updated Vital Signs BP 118/82 (BP Location: Right Arm)   Pulse (!) 110   Temp 98.2 F (36.8 C) (Oral)   Resp 20   Ht 5' 3"  (1.6 m)   Wt 77.1 kg   SpO2 99%   BMI 30.11 kg/m      Physical Exam Constitutional:      General: She is not in acute distress.    Appearance: She is well-developed and normal weight.     Comments: Gravid abdomen  HENT:     Head: Normocephalic and atraumatic.     Right Ear: Tympanic membrane normal.     Left Ear: Tympanic membrane normal.     Ears:     Comments: The right external auditory canal has fissuring and flaking at the opening.  A deeper in the ear canal the EAC appears normal.  TM is normal.  The left ear also has fissuring and flaking at the opening.  The entire canal, however, is swollen almost shut with moisture visible.  There is pain with traction of pinna.  Partial visualization of TM is normal Eyes:  Conjunctiva/sclera: Conjunctivae normal.     Pupils: Pupils are equal, round, and reactive to light.  Cardiovascular:     Rate and Rhythm: Normal rate.  Pulmonary:     Effort: Pulmonary effort is normal. No respiratory distress.   Abdominal:     General: There is no distension.     Palpations: Abdomen is soft.  Musculoskeletal:        General: Normal range of motion.     Cervical back: Normal range of motion.  Lymphadenopathy:     Cervical: No cervical adenopathy.  Skin:    General: Skin is warm and dry.  Neurological:     Mental Status: She is alert.  Psychiatric:        Mood and Affect: Mood normal.     UC Treatments / Results  Labs (all labs ordered are listed, but only abnormal results are displayed) Labs Reviewed - No data to display  EKG   Radiology No results found.  Procedures Procedures (including critical care time)  Medications Ordered in UC Medications - No data to display  Initial Impression / Assessment and Plan / UC Course  I have reviewed the triage vital signs and the nursing notes.  Pertinent labs & imaging results that were available during my care of the patient were reviewed by me and considered in my medical decision making (see chart for details).     Chronic eczematous disease of external ear canals with infection on the left causing swelling and pain.  Continue cortisone for usual management.  Use Cortisporin drops for current ear infection.  Elza Rafter is placed.  Wick management discussed.  Follow-up here as needed Final Clinical Impressions(s) / UC Diagnoses   Final diagnoses:  Chronic eczematous otitis externa of both ears     Discharge Instructions      Continue using regular cortisone cream on the outside of your ears to prevent skin cracking and flaking Use the Cortisporin otic for 5 to 7 days until your ear pain and swelling improve Remove wick when the swelling goes down Call or return if you fail to improve in a week   ED Prescriptions     Medication Sig Dispense Auth. Provider   neomycin-polymyxin-hydrocortisone (CORTISPORIN) 3.5-10000-1 OTIC suspension Place 4 drops into the left ear 3 (three) times daily. 10 mL Raylene Everts, MD      PDMP  not reviewed this encounter.   Raylene Everts, MD 07/21/20 [phone]

## 2020-07-21 NOTE — Telephone Encounter (Signed)
Patient went to Urgent Care.

## 2020-07-21 NOTE — Discharge Instructions (Addendum)
Continue using regular cortisone cream on the outside of your ears to prevent skin cracking and flaking Use the Cortisporin otic for 5 to 7 days until your ear pain and swelling improve Remove wick when the swelling goes down Call or return if you fail to improve in a week

## 2020-07-21 NOTE — ED Triage Notes (Signed)
Left ear pain x 1.5 days, getting worse. Vaccinated

## 2020-08-07 ENCOUNTER — Telehealth (HOSPITAL_COMMUNITY): Payer: Self-pay | Admitting: *Deleted

## 2020-08-07 ENCOUNTER — Encounter (HOSPITAL_COMMUNITY): Payer: Self-pay | Admitting: *Deleted

## 2020-08-07 NOTE — Telephone Encounter (Signed)
Preadmission screen  

## 2020-08-12 ENCOUNTER — Other Ambulatory Visit (HOSPITAL_COMMUNITY)
Admission: RE | Admit: 2020-08-12 | Discharge: 2020-08-12 | Disposition: A | Discharge status: Home / Self Care | Payer: Commercial Managed Care - PPO | Source: Ambulatory Visit | Attending: Obstetrics and Gynecology | Admitting: Obstetrics and Gynecology

## 2020-08-12 DIAGNOSIS — Z20822 Contact with and (suspected) exposure to covid-19: Secondary | ICD-10-CM | POA: Insufficient documentation

## 2020-08-12 DIAGNOSIS — Z01812 Encounter for preprocedural laboratory examination: Secondary | ICD-10-CM | POA: Insufficient documentation

## 2020-08-12 LAB — SARS CORONAVIRUS 2 (TAT 6-24 HRS): SARS Coronavirus 2: NEGATIVE

## 2020-08-13 ENCOUNTER — Other Ambulatory Visit: Payer: Self-pay | Admitting: Obstetrics and Gynecology

## 2020-08-14 ENCOUNTER — Inpatient Hospital Stay (HOSPITAL_COMMUNITY): Admit: 2020-08-14 | Payer: Self-pay

## 2020-08-14 ENCOUNTER — Inpatient Hospital Stay (HOSPITAL_COMMUNITY): Payer: Commercial Managed Care - PPO

## 2020-08-15 ENCOUNTER — Encounter (HOSPITAL_COMMUNITY): Payer: Self-pay | Admitting: Obstetrics and Gynecology

## 2020-08-15 ENCOUNTER — Inpatient Hospital Stay (HOSPITAL_COMMUNITY)
Admission: AD | Admit: 2020-08-15 | Discharge: 2020-08-17 | DRG: 806 | Disposition: A | Discharge status: Home / Self Care | Payer: Commercial Managed Care - PPO | Attending: Obstetrics and Gynecology | Admitting: Obstetrics and Gynecology

## 2020-08-15 ENCOUNTER — Encounter: Payer: Self-pay | Admitting: Family

## 2020-08-15 ENCOUNTER — Inpatient Hospital Stay (HOSPITAL_COMMUNITY): Payer: Commercial Managed Care - PPO | Admitting: Anesthesiology

## 2020-08-15 ENCOUNTER — Other Ambulatory Visit: Payer: Self-pay

## 2020-08-15 DIAGNOSIS — Z3A40 40 weeks gestation of pregnancy: Secondary | ICD-10-CM | POA: Diagnosis not present

## 2020-08-15 DIAGNOSIS — O26893 Other specified pregnancy related conditions, third trimester: Secondary | ICD-10-CM | POA: Diagnosis present

## 2020-08-15 DIAGNOSIS — O9962 Diseases of the digestive system complicating childbirth: Principal | ICD-10-CM | POA: Diagnosis present

## 2020-08-15 DIAGNOSIS — O99824 Streptococcus B carrier state complicating childbirth: Secondary | ICD-10-CM | POA: Diagnosis present

## 2020-08-15 DIAGNOSIS — K5090 Crohn's disease, unspecified, without complications: Secondary | ICD-10-CM | POA: Diagnosis present

## 2020-08-15 DIAGNOSIS — Z20822 Contact with and (suspected) exposure to covid-19: Secondary | ICD-10-CM | POA: Diagnosis present

## 2020-08-15 LAB — CBC
HCT: 38.6 % (ref 36.0–46.0)
Hemoglobin: 12.4 g/dL (ref 12.0–15.0)
MCH: 28.2 pg (ref 26.0–34.0)
MCHC: 32.1 g/dL (ref 30.0–36.0)
MCV: 87.9 fL (ref 80.0–100.0)
Platelets: 231 10*3/uL (ref 150–400)
RBC: 4.39 MIL/uL (ref 3.87–5.11)
RDW: 13.6 % (ref 11.5–15.5)
WBC: 12 10*3/uL — ABNORMAL HIGH (ref 4.0–10.5)
nRBC: 0 % (ref 0.0–0.2)

## 2020-08-15 LAB — TYPE AND SCREEN
ABO/RH(D): B POS
Antibody Screen: NEGATIVE

## 2020-08-15 LAB — OB RESULTS CONSOLE GBS: GBS: POSITIVE

## 2020-08-15 MED ORDER — ONDANSETRON HCL 4 MG PO TABS: 4.0000 mg | ORAL_TABLET | ORAL | Status: DC | PRN

## 2020-08-15 MED ORDER — METHYLERGONOVINE MALEATE 0.2 MG/ML IJ SOLN: 0.2000 mg | INTRAMUSCULAR | Status: DC | PRN

## 2020-08-15 MED ORDER — OXYCODONE HCL 5 MG PO TABS: 5.0000 mg | ORAL_TABLET | ORAL | Status: DC | PRN

## 2020-08-15 MED ORDER — BUTORPHANOL TARTRATE 1 MG/ML IJ SOLN: 1.0000 mg | INTRAMUSCULAR | Status: DC | PRN

## 2020-08-15 MED ORDER — MAGNESIUM HYDROXIDE 400 MG/5ML PO SUSP: 30.0000 mL | ORAL | Status: DC | PRN

## 2020-08-15 MED ORDER — ACETAMINOPHEN 325 MG PO TABS: 650.0000 mg | ORAL_TABLET | ORAL | Status: DC | PRN

## 2020-08-15 MED ORDER — DIBUCAINE (PERIANAL) 1 % EX OINT: 1.0000 "application " | TOPICAL_OINTMENT | CUTANEOUS | Status: DC | PRN

## 2020-08-15 MED ORDER — BENZOCAINE-MENTHOL 20-0.5 % EX AERO
1.0000 "application " | INHALATION_SPRAY | CUTANEOUS | Status: DC | PRN
  Administered 2020-08-16: 1 via TOPICAL
  Filled 2020-08-15: qty 56

## 2020-08-15 MED ORDER — SENNOSIDES-DOCUSATE SODIUM 8.6-50 MG PO TABS
2.0000 | ORAL_TABLET | Freq: Every day | ORAL | Status: DC
  Administered 2020-08-16 – 2020-08-17 (×2): 2 via ORAL
  Filled 2020-08-15 (×2): qty 2

## 2020-08-15 MED ORDER — ONDANSETRON HCL 4 MG/2ML IJ SOLN: 4.0000 mg | INTRAMUSCULAR | Status: DC | PRN

## 2020-08-15 MED ORDER — PHENYLEPHRINE 40 MCG/ML (10ML) SYRINGE FOR IV PUSH (FOR BLOOD PRESSURE SUPPORT): 80.0000 ug | PREFILLED_SYRINGE | INTRAVENOUS | Status: DC | PRN

## 2020-08-15 MED ORDER — PRENATAL MULTIVITAMIN CH
1.0000 | ORAL_TABLET | Freq: Every day | ORAL | Status: DC
  Administered 2020-08-16 – 2020-08-17 (×2): 1 via ORAL
  Filled 2020-08-15 (×2): qty 1

## 2020-08-15 MED ORDER — OXYTOCIN-SODIUM CHLORIDE 30-0.9 UT/500ML-% IV SOLN
1.0000 m[IU]/min | INTRAVENOUS | Status: DC
  Administered 2020-08-15: 2 m[IU]/min via INTRAVENOUS
  Filled 2020-08-15: qty 500

## 2020-08-15 MED ORDER — OXYTOCIN-SODIUM CHLORIDE 30-0.9 UT/500ML-% IV SOLN
2.5000 [IU]/h | INTRAVENOUS | Status: DC
  Administered 2020-08-15: 2.5 [IU]/h via INTRAVENOUS

## 2020-08-15 MED ORDER — FENTANYL-BUPIVACAINE-NACL 0.5-0.125-0.9 MG/250ML-% EP SOLN
EPIDURAL | Status: AC
  Filled 2020-08-15: qty 250

## 2020-08-15 MED ORDER — LACTATED RINGERS IV SOLN
500.0000 mL | Freq: Once | INTRAVENOUS | Status: AC
  Administered 2020-08-15: 500 mL via INTRAVENOUS

## 2020-08-15 MED ORDER — EPHEDRINE 5 MG/ML INJ: 10.0000 mg | INTRAVENOUS | Status: DC | PRN

## 2020-08-15 MED ORDER — OXYCODONE-ACETAMINOPHEN 5-325 MG PO TABS: 2.0000 | ORAL_TABLET | ORAL | Status: DC | PRN

## 2020-08-15 MED ORDER — SODIUM CHLORIDE 0.9 % IV SOLN
5.0000 10*6.[IU] | Freq: Once | INTRAVENOUS | Status: AC
Start: 2020-08-15 — End: 2020-08-15
  Administered 2020-08-15: 5 10*6.[IU] via INTRAVENOUS
  Filled 2020-08-15: qty 5

## 2020-08-15 MED ORDER — METHYLERGONOVINE MALEATE 0.2 MG PO TABS: 0.2000 mg | ORAL_TABLET | ORAL | Status: DC | PRN

## 2020-08-15 MED ORDER — MEASLES, MUMPS & RUBELLA VAC IJ SOLR: 0.5000 mL | Freq: Once | INTRAMUSCULAR | Status: DC

## 2020-08-15 MED ORDER — FENTANYL-BUPIVACAINE-NACL 0.5-0.125-0.9 MG/250ML-% EP SOLN
12.0000 mL/h | EPIDURAL | Status: DC | PRN
  Administered 2020-08-15: 12 mL/h via EPIDURAL

## 2020-08-15 MED ORDER — ONDANSETRON HCL 4 MG/2ML IJ SOLN: 4.0000 mg | Freq: Four times a day (QID) | INTRAMUSCULAR | Status: DC | PRN

## 2020-08-15 MED ORDER — DIPHENHYDRAMINE HCL 50 MG/ML IJ SOLN
12.5000 mg | INTRAMUSCULAR | Status: DC | PRN
Start: 2020-08-15 — End: 2020-08-15

## 2020-08-15 MED ORDER — COCONUT OIL OIL
1.0000 "application " | TOPICAL_OIL | Status: DC | PRN
  Administered 2020-08-16: 1 via TOPICAL

## 2020-08-15 MED ORDER — SOD CITRATE-CITRIC ACID 500-334 MG/5ML PO SOLN: 30.0000 mL | ORAL | Status: DC | PRN

## 2020-08-15 MED ORDER — LIDOCAINE HCL (PF) 1 % IJ SOLN: 30.0000 mL | INTRAMUSCULAR | Status: DC | PRN

## 2020-08-15 MED ORDER — OXYTOCIN BOLUS FROM INFUSION
333.0000 mL | Freq: Once | INTRAVENOUS | Status: AC
  Administered 2020-08-15: 333 mL via INTRAVENOUS

## 2020-08-15 MED ORDER — LACTATED RINGERS IV SOLN: 500.0000 mL | INTRAVENOUS | Status: DC | PRN

## 2020-08-15 MED ORDER — IBUPROFEN 600 MG PO TABS
600.0000 mg | ORAL_TABLET | Freq: Four times a day (QID) | ORAL | Status: DC
  Administered 2020-08-16 – 2020-08-17 (×7): 600 mg via ORAL
  Filled 2020-08-15 (×7): qty 1

## 2020-08-15 MED ORDER — LIDOCAINE HCL (PF) 1 % IJ SOLN
INTRAMUSCULAR | Status: DC | PRN
  Administered 2020-08-15: 8 mL via EPIDURAL

## 2020-08-15 MED ORDER — PENICILLIN G POT IN DEXTROSE 60000 UNIT/ML IV SOLN
3.0000 10*6.[IU] | INTRAVENOUS | Status: DC
  Administered 2020-08-15: 3 10*6.[IU] via INTRAVENOUS
  Filled 2020-08-15: qty 50

## 2020-08-15 MED ORDER — DIPHENHYDRAMINE HCL 25 MG PO CAPS: 25.0000 mg | ORAL_CAPSULE | Freq: Four times a day (QID) | ORAL | Status: DC | PRN

## 2020-08-15 MED ORDER — OXYCODONE HCL 5 MG PO TABS: 10.0000 mg | ORAL_TABLET | ORAL | Status: DC | PRN

## 2020-08-15 MED ORDER — ZOLPIDEM TARTRATE 5 MG PO TABS: 5.0000 mg | ORAL_TABLET | Freq: Every evening | ORAL | Status: DC | PRN

## 2020-08-15 MED ORDER — LACTATED RINGERS IV SOLN: INTRAVENOUS | Status: DC

## 2020-08-15 MED ORDER — SIMETHICONE 80 MG PO CHEW: 80.0000 mg | CHEWABLE_TABLET | ORAL | Status: DC | PRN

## 2020-08-15 MED ORDER — TERBUTALINE SULFATE 1 MG/ML IJ SOLN: 0.2500 mg | Freq: Once | INTRAMUSCULAR | Status: DC | PRN

## 2020-08-15 MED ORDER — OXYCODONE-ACETAMINOPHEN 5-325 MG PO TABS
1.0000 | ORAL_TABLET | ORAL | Status: DC | PRN
Start: 2020-08-15 — End: 2020-08-15

## 2020-08-15 MED ORDER — WITCH HAZEL-GLYCERIN EX PADS: 1.0000 "application " | MEDICATED_PAD | CUTANEOUS | Status: DC | PRN

## 2020-08-15 MED ORDER — TETANUS-DIPHTH-ACELL PERTUSSIS 5-2.5-18.5 LF-MCG/0.5 IM SUSY: 0.5000 mL | PREFILLED_SYRINGE | Freq: Once | INTRAMUSCULAR | Status: DC

## 2020-08-15 NOTE — H&P (Signed)
Carla Clark is a 35 y.o. female, G2 P1001, EGA [redacted] weeks with EDC 7-22 presenting for elective induction.  [place] complicated by Crohns disease treated with Inflecta-last dose around 32 weeks.  OB History     Gravida  2   Para  1   Term  1   Preterm  0   AB  0   Living  1      SAB  0   IAB  0   Ectopic  0   Multiple  0   Live Births  1          Past Medical History:  Diagnosis Date   Anal fissure    Anemia 11/2011   iron deficiency. 01/2013 parenteral iron. Intranasal B12.  Dr Marin Olp follows.    B12 deficiency 11/2012   Breast mass in female 04/21/2012   Cervical cancer screening 01/27/2012   Chicken pox as a child   Crohn's disease ([place]) 2013   ileitis 10/2011. ileitis, mesenteric abscess, ? entero-enteric fistula on 08/2012 CT enterography   Dermatitis 08/22/2016   GERD (gastroesophageal reflux disease)    Hepatitis B immune 07/2012   previous vaccination.    Hives 09/2014   per Dermatologist.  Rx Zyrtec, Benadryl.    Ileitis    Serrated polyp of colon    Small bowel obstruction (HCC)    SVD (spontaneous vaginal delivery) 08/13/2018   Vitamin D deficiency 2014   Past Surgical History:  Procedure Laterality Date   LAPAROSCOPIC RIGHT HEMI COLECTOMY  07/11/2019   WISDOM TOOTH EXTRACTION  35 yrs old   Family History: family history includes Brain cancer in her father; Breast cancer in her paternal aunt; Cancer in her paternal aunt and paternal uncle; Cancer (age of onset: 28) in her paternal grandmother; Gout in her maternal grandfather; Heart attack in her maternal grandfather; Lung cancer in her maternal grandmother; Nephrolithiasis in her father; Other (age of onset: 77) in her paternal uncle; Ovarian cancer in her paternal aunt and paternal grandmother; Pancreatic cancer in her paternal grandfather and paternal uncle. Social History:  reports that she has never smoked. She has never used smokeless tobacco. She reports previous alcohol use. She reports that  she does not use drugs.     Maternal Diabetes: No Genetic Screening: Declined Maternal Ultrasounds/Referrals: Normal Fetal Ultrasounds or other Referrals:  None Maternal Substance Abuse:  No Significant Maternal Medications:  Meds include: Other:  Significant Maternal Lab Results:  Group B Strep positive Other Comments:   Inflecta  Review of Systems  Respiratory: Negative.    Cardiovascular: Negative.   Maternal Medical History:  Contractions: Frequency: irregular.   Perceived severity is mild.   Fetal activity: Perceived fetal activity is normal.   Prenatal complications: no prenatal complications Prenatal Complications - Diabetes: none.    Blood pressure 122/86, pulse 100, height 5' 3"  (1.6 m), weight 82.6 kg, not currently breastfeeding. Maternal Exam:  Uterine Assessment: Contraction strength is mild.  Contraction frequency is irregular.  Abdomen: Patient reports no abdominal tenderness. Estimated fetal weight is 7.5 lbs.   Fetal presentation: vertex Introitus: Normal vulva. Normal vagina.  Amniotic fluid character: not assessed. Pelvis: adequate for delivery.     Fetal Exam Fetal Monitor Review: Mode: ultrasound.   Baseline rate: 120.  Variability: moderate (6-25 bpm).   Pattern: accelerations present and no decelerations.   Fetal State Assessment: Category I - tracings are normal.  Physical Exam Vitals reviewed.  Constitutional:      Appearance: Normal appearance.  Cardiovascular:     Rate and Rhythm: Normal rate and regular rhythm.  Pulmonary:     Effort: Pulmonary effort is normal. No respiratory distress.  Abdominal:     Palpations: Abdomen is soft.  Genitourinary:    General: Normal vulva.  Neurological:     Mental Status: She is alert.    Prenatal labs: ABO, Rh:  B pos Antibody:  neg Rubella:  immune RPR:   NR HBsAg:   neg HIV:   NR GBS:   pos  Assessment/Plan: IUP at 40 weeks, Crohns disease, +GBS, for elective induction.  Will start  pitocin, PCN for GBS, monitor progress, anticipate SVD   Blane Ohara Demetruis Depaul 08/15/2020, 10:27 AM

## 2020-08-15 NOTE — Anesthesia Procedure Notes (Signed)
Epidural Patient location during procedure: OB Start time: 08/15/2020 3:05 PM End time: 08/15/2020 3:14 PM  Staffing Anesthesiologist: Merlinda Frederick, MD Performed: anesthesiologist   Preanesthetic Checklist Completed: patient identified, IV checked, site marked, risks and benefits discussed, monitors and equipment checked, pre-op evaluation and timeout performed  Epidural Patient position: sitting Prep: DuraPrep Patient monitoring: heart rate, cardiac monitor, continuous pulse ox and blood pressure Approach: midline Location: L3-L4 Injection technique: LOR saline  Needle:  Needle type: Tuohy  Needle gauge: 17 G Needle length: 9 cm Needle insertion depth: 5 cm Catheter type: closed end flexible Catheter size: 20 Guage Catheter at skin depth: 10 cm Test dose: negative and Other  Assessment Events: blood not aspirated, injection not painful, no injection resistance and negative IV test  Additional Notes Informed consent obtained prior to proceeding including risk of failure, 1% risk of PDPH, risk of minor discomfort and bruising.  Discussed rare but serious complications including epidural abscess, permanent nerve injury, epidural hematoma.  Discussed alternatives to epidural analgesia and patient desires to proceed.  Timeout performed pre-procedure verifying patient name, procedure, and platelet count.  Patient tolerated procedure well.

## 2020-08-15 NOTE — Lactation Note (Signed)
This note was copied from a baby's chart. Lactation Consultation Note  Patient Name: Carla Clark [id number] Date: 08/15/2020 Reason for consult: L&D Initial assessment;Term Age:33 hours  "Carla Clark" P2 Mom L&D assist. Baby latched with guidance in cross cradle hold.  Pillows placed to support baby and Mom's arms.  Baby latched deeply and sucked/swallowed consistently.  Encouraged STS and feeding baby often with cues.   Maternal Data Has patient been taught Hand Expression?: Yes Does the patient have breastfeeding experience prior to this delivery?: Yes  Feeding Mother's Current Feeding Choice: Breast Milk  LATCH Score Latch: Grasps breast easily, tongue down, lips flanged, rhythmical sucking.  Audible Swallowing: Spontaneous and intermittent  Type of Nipple: Everted at rest and after stimulation  Comfort (Breast/Nipple): Soft / non-tender  Hold (Positioning): Assistance needed to correctly position infant at breast and maintain latch.  LATCH Score: 9   Interventions Interventions: Breast feeding basics reviewed;Assisted with latch;Skin to skin;Breast massage;Hand express;Adjust position;Support pillows;Position options;Expressed milk   Consult Status Consult Status: Follow-up Date: 08/16/20 Follow-up type: [place] 08/15/2020, 6:16 PM

## 2020-08-15 NOTE — Progress Notes (Signed)
Feeling ctx Afeb, VSS FHT-110-120, Cat I VE-4/80/-1, vtx, AROM clear Continue pitocin, anticipate SVD

## 2020-08-15 NOTE — Anesthesia Preprocedure Evaluation (Signed)
Anesthesia Evaluation  Patient identified by MRN, date of birth, ID band Patient awake    Reviewed: Allergy & Precautions, Patient's Chart, lab work & pertinent test results  Airway Mallampati: II  TM Distance: >3 FB Neck ROM: Full    Dental no notable dental hx.    Pulmonary neg pulmonary ROS,    Pulmonary exam normal breath sounds clear to auscultation       Cardiovascular negative cardio ROS Normal cardiovascular exam Rhythm:Regular Rate:Normal     Neuro/Psych negative neurological ROS  negative psych ROS   GI/Hepatic Neg liver ROS, GERD  ,Crohn's   Endo/Other  negative endocrine ROS  Renal/GU negative Renal ROS  negative genitourinary   Musculoskeletal negative musculoskeletal ROS (+)   Abdominal   Peds negative pediatric ROS (+)  Hematology negative hematology ROS (+)   Anesthesia Other Findings   Reproductive/Obstetrics negative OB ROS                             Anesthesia Physical Anesthesia Plan  ASA: 2  Anesthesia Plan: Epidural   Post-op Pain Management:    Induction:   PONV Risk Score and Plan: 2 and Treatment may vary due to age or medical condition  Airway Management Planned: Natural Airway  Additional Equipment:   Intra-op Plan:   Post-operative Plan:   Informed Consent: I have reviewed the patients History and Physical, chart, labs and discussed the procedure including the risks, benefits and alternatives for the proposed anesthesia with the patient or authorized representative who has indicated his/her understanding and acceptance.       Plan Discussed with: Anesthesiologist  Anesthesia Plan Comments:         Anesthesia Quick Evaluation

## 2020-08-16 LAB — RPR: RPR Ser Ql: NONREACTIVE

## 2020-08-16 NOTE — Anesthesia Postprocedure Evaluation (Signed)
Anesthesia Post Note  Patient: Carla Clark  Procedure(s) Performed: AN AD [place]     Patient location during evaluation: Mother Baby Anesthesia Type: Epidural Level of consciousness: awake Pain management: satisfactory to patient Vital Signs Assessment: post-procedure vital signs reviewed and stable Respiratory status: spontaneous breathing Cardiovascular status: stable Anesthetic complications: no   No notable events documented.  Last Vitals:  Vitals:   08/16/20 0410 08/16/20 0845  BP: 120/71 (!) 108/58  Pulse: 78 86  Resp: 16 18  Temp: 36.8 C 36.8 C  SpO2: 99% 99%    Last Pain:  Vitals:   08/16/20 0845  TempSrc: Oral  PainSc:    Pain Goal:                   [employer]

## 2020-08-16 NOTE — Progress Notes (Signed)
PPD #1 No problems, not sure if wants to try to go home this pm Afeb, VSS Fundus firm, NT at U-1 Continue routine postpartum care

## 2020-08-16 NOTE — Lactation Note (Signed)
This note was copied from a baby's chart. Lactation Consultation Note  Patient Name: Carla Clark Carla Clark Date: 08/16/2020 Reason for consult: Follow-up assessment;Term Age:33 hours  Mom sitting in bed holding sleeping baby, dad sitting in chair.  Maternal concerns: Mom unsure if baby has deep enough latch, c/o chaffing to nipples. States baby grazes for 2hrs during feeds. Plans to return to work in September but concerned has not yet started pumping.  On assessment nipples everted bilat, no signs of damage. Baby up to breast skin to skin, assisted with sandwiching and latching baby to breast vs allowing baby to inch from nipple to breast. Right nipple round on release, mom latched to left breast cross cradle with minimal assistance.  Plan - Feed on cue 8-12 times a day - Continuous skin to skin for feeds and while awake - Sandwich breast for deep latch - Cross cradle vs cradle for deep latch - Apply EBM after feeds, air dry, use coconut oil as needed - Lactation to assess for tongue restriction prior to discharge - Pump for storage ~1week prior to return to work - Avoid bottles and pacifiers unless indicated - Call for lactation if needed prior to next visit  Mom voiced understanding and with no further concerns. Left the room with baby still latched ~28mn mark. BGilliam, RN, IBCLC      Maternal Data How long did the patient breastfeed?: 52moFeeding Mother's Current Feeding Choice: Breast Milk  LATCH Score Latch: Grasps breast easily, tongue down, lips flanged, rhythmical sucking.  Audible Swallowing: A few with stimulation  Type of Nipple: Everted at rest and after stimulation  Comfort (Breast/Nipple): Soft / non-tender  Hold (Positioning): Assistance needed to correctly position infant at breast and maintain latch.  LATCH Score: 8   Interventions Interventions: Breast feeding basics reviewed;Assisted with latch;Skin to skin;Breast compression;Adjust  position;Position options;Support pillows  Discharge WIC Program: No  Consult Status Consult Status: Follow-up Date: 08/17/20 Follow-up type: In-patient    Carla Clark/24/2022, 3:51 PM

## 2020-08-17 MED ORDER — IBUPROFEN 800 MG PO TABS: 800.0000 mg | ORAL_TABLET | Freq: Three times a day (TID) | ORAL | 1 refills | Status: DC | PRN

## 2020-08-17 NOTE — Progress Notes (Signed)
POSTPARTUM PROGRESS NOTE  Post Partum Day #2  Subjective:  No acute events overnight.  Pt denies problems with ambulating, voiding or po intake.  She denies nausea or vomiting.  Pain is well controlled.   Lochia Minimal. Working on latch on right side otherwise doing well  Objective: Blood pressure 123/65, pulse 79, temperature 97.7 F (36.5 C), temperature source Oral, resp. rate 16, height 5' 3"  (1.6 m), weight 82.6 kg, SpO2 99 %, unknown if currently breastfeeding.  Physical Exam:  General: alert, cooperative and no distress Lochia:normal flow Chest: CTAB Heart: RRR no m/r/g Abdomen: +BS, soft, nontender Uterine Fundus: firm, 1cm below umbilicus GU: suture intact, healing well, no purulent drainage Extremities: neg edema, neg calf TTP BL, neg Homans BL  Recent Labs    08/15/20 1025  HGB 12.4  HCT 38.6    Assessment/Plan:  ASSESSMENT: ETHAN KASPERSKI is a 35 y.o. [id number] s/p SVD @ [redacted]w[redacted]d PNC c/b Crohn's on Inflecta.   Discharge home, Breastfeeding, and Lactation consult   LOS: 2 days

## 2020-08-17 NOTE — Lactation Note (Signed)
This note was copied from a baby's chart. Lactation Consultation Note  Patient Name: Carla Clark [id number] Date: 08/17/2020 Reason for consult: Follow-up assessment;Nipple pain/trauma;Term;Infant weight loss;Other (Comment) (7 % weight loss, per mom milk is coming in) Age:33 hours Per mom having soreness on the right nipple / LC offered to assess and hand mom hand express / and compress the areola - ( edema noted - indication for Breast shells / comfort gels )  Latch scores had been 8's   LC Plan:  For  sore nipple and engorgement prevention and tx  Comfort gels after feedings x 6 days/ alternating with breast shells and coconut oil.  Steps for latching -  Breast massage , hand express, prepump with the hand pump , reverse pressure ( as shown ), Latch with firm support.  For today for several feedings feed on the left breast and pump the other breast.  Full breast are normal ( good sign )  If breast are greater than full, try moist warm heat, if no let down, switch to cold ( frozen vegetables )  Then the above steps.   Mom has the [facility] brochure and recommended if soreness not clear by 4 days to call for [facility] O/P/.   Maternal Data Has patient been taught Hand Expression?: Yes  Feeding Mother's Current Feeding Choice: Breast Milk and Formula  LATCH Score                    Lactation Tools Discussed/Used Tools: Pump;Flanges;Coconut oil;Comfort gels;Other (comment) (per mom has Shells at home) Flange Size: 24 Breast pump type: Manual Pump Education: Milk Storage (per mom has been using the hand pump)  Interventions Interventions: Breast feeding basics reviewed;Coconut oil;Shells;Comfort gels;Hand pump;Education  Discharge Discharge Education: Engorgement and breast care;Warning signs for feeding baby Pump: Personal;DEBP;Manual  Consult Status Consult Status: Complete Date: 08/17/20    Myer Haff 08/17/2020, 1:25 PM

## 2020-08-17 NOTE — Discharge Summary (Signed)
Postpartum Discharge Summary  Date of Service updated     Patient Name: Carla Clark DOB: [birth year] MRN: [id number]  Date of admission: 08/15/2020 Delivery date:08/15/2020  Delivering provider: Willis Modena, TODD  Date of discharge: 08/17/2020  Admitting diagnosis: Indication for care in labor or delivery [O75.9] Intrauterine pregnancy: [redacted]w[redacted]d    Secondary diagnosis:  Active Problems:   Indication for care in labor or delivery  Additional problems: Chronic Crohns    Discharge diagnosis: Term Pregnancy Delivered                                              Post partum procedures: one Augmentation: AROM and Pitocin Complications: None  Hospital course: Induction of Labor With Vaginal Delivery   35y.o. yo G2P2002 at 436w1das admitted to the hospital 08/15/2020 for induction of labor.  Indication for induction: Favorable cervix at term.  Patient had an uncomplicated labor course as follows: Membrane Rupture Time/Date: 2:16 PM ,08/15/2020   Delivery Method:Vaginal, Spontaneous  Episiotomy: Median  Lacerations:  None  Details of delivery can be found in separate delivery note.  Patient had a routine postpartum course. Patient is discharged home 08/17/20.  Newborn Data: Birth date:08/15/2020  Birth time:5:28 PM  Gender:Female  Living status:Living  Apgars:9 ,9  Weight:3909 g    Physical exam  Vitals:   08/16/20 0410 08/16/20 0845 08/16/20 2134 08/17/20 0529  BP: 120/71 (!) 108/58 112/68 123/65  Pulse: 78 86 76 79  Resp: 16 18 16 16   Temp: 9829.4 (3676.5) 98.2 F (36.8 C) 97.6 F (36.4 C) 97.7 F (36.5 C)  TempSrc: Oral Oral Oral Oral  SpO2: 99% 99% 99% 99%  Weight:      Height:       General: alert, cooperative, and no distress Lochia: appropriate Uterine Fundus: firm Incision: N/A DVT Evaluation: No evidence of DVT seen on physical exam. Negative Homan's sign. No cords or calf tenderness. Labs: Lab Results  Component Value Date   WBC 12.0 (H) 08/15/2020    HGB 12.4 08/15/2020   HCT 38.6 08/15/2020   MCV 87.9 08/15/2020   PLT 231 08/15/2020   CMP Latest Ref Rng & Units 09/16/2019  Glucose 70 - 99 mg/dL 94  BUN 6 - 23 mg/dL 12  Creatinine 0.40 - 1.20 mg/dL 0.91  Sodium 135 - 145 mEq/L 136  Potassium 3.5 - 5.1 mEq/L 4.0  Chloride 96 - 112 mEq/L 104  CO2 19 - 32 mEq/L 26  Calcium 8.4 - 10.5 mg/dL 9.7  Total Protein 6.0 - 8.3 g/dL 7.6  Total Bilirubin 0.2 - 1.2 mg/dL 0.3  Alkaline Phos 39 - 117 U/L 56  AST 0 - 37 U/L 10  ALT 0 - 35 U/L 12   Edinburgh Score: Edinburgh Postnatal Depression Scale Screening Tool 08/16/2020  I have been able to laugh and see the funny side of things. 0  I have looked forward with enjoyment to things. 0  I have blamed myself unnecessarily when things went wrong. 1  I have been anxious or worried for no good reason. 1  I have felt scared or panicky for no good reason. 1  Things have been getting on top of me. 1  I have been so unhappy that I have had difficulty sleeping. 0  I have felt sad or miserable. 0  I have been  so unhappy that I have been crying. 0  The thought of harming myself has occurred to me. 0  Edinburgh Postnatal Depression Scale Total 4      After visit meds:  Allergies as of 08/17/2020   No Known Allergies      Medication List     TAKE these medications    ibuprofen 800 MG tablet Commonly known as: ADVIL Take 1 tablet (800 mg total) by mouth every 8 (eight) hours as needed.   inFLIXimab 100 MG injection Commonly known as: REMICADE Inject into the vein every 8 (eight) weeks.   neomycin-polymyxin-hydrocortisone 3.5-10000-1 OTIC suspension Commonly known as: CORTISPORIN Place 4 drops into the left ear 3 (three) times daily.   PRENATAL VITAMIN PO Take 4 tablets by mouth daily.   vitamin B-12 100 MCG tablet Commonly known as: CYANOCOBALAMIN Take 100 mcg by mouth daily.         Discharge home in stable condition Infant Feeding: Breast Infant Disposition:home with  mother Discharge instruction: per After Visit Summary and Postpartum booklet. Activity: Advance as tolerated. Pelvic rest for 6 weeks.  Diet: routine diet Anticipated Birth Control: Unsure Postpartum Appointment:6 weeks Future Appointments: Future Appointments  Date Time Provider [place]  09/01/2020 10:15 AM [facility] None  09/01/2020 10:45 AM [place], Holli Humbles, NP [facility] None  09/08/2020  1:30 PM [place], Lajuan Lines, MD [facility]   Follow up Visit:      08/17/2020 Deliah Boston, MD

## 2020-08-26 ENCOUNTER — Telehealth (HOSPITAL_COMMUNITY): Payer: Self-pay | Admitting: *Deleted

## 2020-08-26 NOTE — Telephone Encounter (Signed)
Hospital discharge follow-up call attempted. Left message for patient to return RN call. Erline Levine, RN, 08/26/20, 1933

## 2020-09-01 ENCOUNTER — Inpatient Hospital Stay: Payer: Commercial Managed Care - PPO | Admitting: Family

## 2020-09-01 ENCOUNTER — Other Ambulatory Visit: Payer: Self-pay

## 2020-09-01 ENCOUNTER — Inpatient Hospital Stay: Payer: Commercial Managed Care - PPO | Attending: Hematology & Oncology

## 2020-09-01 ENCOUNTER — Encounter: Payer: Self-pay | Admitting: Family

## 2020-09-01 VITALS — BP 112/79 | HR 87 | Temp 98.9°F | Resp 18 | Ht 63.0 in | Wt 157.8 lb

## 2020-09-01 DIAGNOSIS — K909 Intestinal malabsorption, unspecified: Secondary | ICD-10-CM | POA: Diagnosis not present

## 2020-09-01 DIAGNOSIS — K5090 Crohn's disease, unspecified, without complications: Secondary | ICD-10-CM | POA: Diagnosis present

## 2020-09-01 DIAGNOSIS — D500 Iron deficiency anemia secondary to blood loss (chronic): Secondary | ICD-10-CM

## 2020-09-01 LAB — CBC WITH DIFFERENTIAL (CANCER CENTER ONLY)
Abs Immature Granulocytes: 0.02 10*3/uL (ref 0.00–0.07)
Basophils Absolute: 0 10*3/uL (ref 0.0–0.1)
Basophils Relative: 1 %
Eosinophils Absolute: 0.2 10*3/uL (ref 0.0–0.5)
Eosinophils Relative: 3 %
HCT: 42.3 % (ref 36.0–46.0)
Hemoglobin: 13.2 g/dL (ref 12.0–15.0)
Immature Granulocytes: 0 %
Lymphocytes Relative: 48 %
Lymphs Abs: 3.7 10*3/uL (ref 0.7–4.0)
MCH: 27.3 pg (ref 26.0–34.0)
MCHC: 31.2 g/dL (ref 30.0–36.0)
MCV: 87.4 fL (ref 80.0–100.0)
Monocytes Absolute: 0.6 10*3/uL (ref 0.1–1.0)
Monocytes Relative: 7 %
Neutro Abs: 3.1 10*3/uL (ref 1.7–7.7)
Neutrophils Relative %: 41 %
Platelet Count: 324 10*3/uL (ref 150–400)
RBC: 4.84 MIL/uL (ref 3.87–5.11)
RDW: 13.2 % (ref 11.5–15.5)
WBC Count: 7.7 10*3/uL (ref 4.0–10.5)
nRBC: 0 % (ref 0.0–0.2)

## 2020-09-01 LAB — IRON AND TIBC
Iron: 72 ug/dL (ref 41–142)
Saturation Ratios: 17 % — ABNORMAL LOW (ref 21–57)
TIBC: 425 ug/dL (ref 236–444)
UIBC: 353 ug/dL (ref 120–384)

## 2020-09-01 LAB — RETICULOCYTES
Immature Retic Fract: 3.1 % (ref 2.3–15.9)
RBC.: 4.81 MIL/uL (ref 3.87–5.11)
Retic Count, Absolute: 29.8 10*3/uL (ref 19.0–186.0)
Retic Ct Pct: 0.6 % (ref 0.4–3.1)

## 2020-09-01 LAB — FERRITIN: Ferritin: 54 ng/mL (ref 11–307)

## 2020-09-01 NOTE — Progress Notes (Signed)
Hematology and Oncology Follow Up Visit  Carla Clark [id number] 26-Jul-1985 35 y.o. 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 She just had her sweet baby girl Carla Clark 2 weeks ago and they are both doing well.  She is having very light spotting. No other blood loss noted.  No bruising or petechiae.  She notes that she has been craving ice.  No fever, chills, n/v, cough, rash, dizziness, SOB, chest pain, palpitations, abdominal pain or changes in bowel or bladder habits.  No swelling, tenderness, numbness or tingling in her extremities.  No falls or syncope.  She has a good appetite but states that she needs to hydrate better throughout the day. Her weight is stable at 157 lbs.   ECOG Performance Status: 1 - Symptomatic but completely ambulatory  Medications:  Allergies as of 09/01/2020   No Known Allergies      Medication List        Accurate as of September 01, 2020 10:42 AM. If you have any questions, ask your nurse or doctor.          STOP taking these medications    ibuprofen 800 MG tablet Commonly known as: ADVIL Stopped by: [name], NP       TAKE these medications    inFLIXimab 100 MG injection Commonly known as: REMICADE Inject into the vein every 8 (eight) weeks.   neomycin-polymyxin-hydrocortisone 3.5-10000-1 OTIC suspension Commonly known as: CORTISPORIN Place 4 drops into the left ear 3 (three) times daily.   PRENATAL VITAMIN PO Take 4 tablets by mouth daily.   vitamin B-12 100 MCG tablet Commonly known as: CYANOCOBALAMIN Take 100 mcg by mouth daily.        Allergies: No Known Allergies  Past Medical History, Surgical history, Social history, and Family History were reviewed and updated.  Review of Systems: All other 10 point review of systems  is negative.   Physical Exam:  height is 5' 3"  (1.6 m) and weight is 157 lb 12.8 oz (71.6 kg). Her oral temperature is 98.9 F (37.2 C). Her blood pressure is 112/79 and her pulse is 87. Her respiration is 18 and oxygen saturation is 100%.   Wt Readings from Last 3 Encounters:  09/01/20 157 lb 12.8 oz (71.6 kg)  08/15/20 182 lb (82.6 kg)  07/21/20 170 lb (77.1 kg)    Ocular: Sclerae unicteric, pupils equal, round and reactive to light Ear-nose-throat: Oropharynx clear, dentition fair Lymphatic: No cervical or supraclavicular adenopathy Lungs no rales or rhonchi, good excursion bilaterally Heart regular rate and rhythm, no murmur appreciated Abd soft, nontender, positive bowel sounds MSK no focal spinal tenderness, no joint edema Neuro: non-focal, well-oriented, appropriate affect Breasts: Deferred   Lab Results  Component Value Date   WBC 7.7 09/01/2020   HGB 13.2 09/01/2020   HCT 42.3 09/01/2020   MCV 87.4 09/01/2020   PLT 324 09/01/2020   Lab Results  Component Value Date   FERRITIN 98 05/11/2020   IRON 85 05/11/2020   TIBC 423 05/11/2020   UIBC 337 05/11/2020   IRONPCTSAT 20 (L) 05/11/2020   Lab Results  Component Value Date   RETICCTPCT 0.6 09/01/2020   RBC 4.84 09/01/2020   RBC  4.81 09/01/2020   RETICCTABS 13.7 (L) 01/20/2015   No results found for: Nils Pyle, [facility] Lab Results  Component Value Date   IGA 187 03/11/2016   No results found for: Ronnald Ramp, A1GS, Nelida Meuse, SPEI   Chemistry      Component Value Date/Time   NA 136 09/16/2019 1628   K 4.0 09/16/2019 1628   CL 104 09/16/2019 1628   CO2 26 09/16/2019 1628   BUN 12 09/16/2019 1628   CREATININE 0.91 09/16/2019 1628   CREATININE 0.62 08/20/2012 1139      Component Value Date/Time   CALCIUM 9.7 09/16/2019 1628   ALKPHOS 56 09/16/2019 1628   AST 10 09/16/2019 1628   ALT 12 09/16/2019 1628   BILITOT 0.3 09/16/2019 1628        Impression and Plan: Carla Clark is a very pleasant 35 yo caucasian female with history of iron deficiency anemia secondary to 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  Her birth went nicely and she and baby are doing well.  Iron studies are pending. We will replace if needed.  Follow-up in 4 months.  She can contact our office with any questions or concerns.   [name], NP 8/9/202210:42 AM

## 2020-09-04 ENCOUNTER — Telehealth: Payer: Self-pay | Admitting: *Deleted

## 2020-09-04 NOTE — Telephone Encounter (Signed)
Per 09/01/20 los called and lvm of upcoming appointments - mailed calendar

## 2020-09-08 ENCOUNTER — Ambulatory Visit: Payer: Commercial Managed Care - PPO | Admitting: Internal Medicine

## 2020-09-08 ENCOUNTER — Encounter: Payer: Self-pay | Admitting: Internal Medicine

## 2020-09-08 VITALS — BP 124/68 | HR 68 | Ht 63.0 in | Wt 157.5 lb

## 2020-09-08 DIAGNOSIS — D500 Iron deficiency anemia secondary to blood loss (chronic): Secondary | ICD-10-CM

## 2020-09-08 DIAGNOSIS — K50013 Crohn's disease of small intestine with fistula: Secondary | ICD-10-CM

## 2020-09-08 DIAGNOSIS — E538 Deficiency of other specified B group vitamins: Secondary | ICD-10-CM

## 2020-09-08 MED ORDER — AZATHIOPRINE 50 MG PO TABS
50.0000 mg | ORAL_TABLET | Freq: Every day | ORAL | 5 refills | Status: DC
Start: 2020-09-08 — End: 2021-05-04

## 2020-09-08 NOTE — Progress Notes (Signed)
Subjective:    Patient ID: Carla Clark, female    DOB: Nov 07, 1985, 35 y.o.   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  She was last seen in February 2022 at which time she was [redacted] weeks pregnant with her second child.  She was induced on 08/15/2020 at 40 weeks 1 day and delivered a healthy child without complication.  She had routine postpartum course.  She delivered a girl (Apgar 9 and 9).  She is breast-feeding but mostly by pumping.  She reports she is doing and feeling well.  Her last infliximab infusion was on 09/02/2020.  She did not interrupt infliximab therapy for any length of time.  She has remained off of azathioprine 50 mg daily.  She has not had any abdominal pain.  She has had some mild loose stool since restarting probiotic but only once per day.  No blood in stool or melena.  Good appetite.  No heartburn.  She did see hematology recently and will begin iron infusions tomorrow planning for doses of Venofer.    She will remain out of work until after Labor Day for her maternity leave.  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   Review of Systems As per HPI, otherwise negative  Current Medications, Allergies, Past Medical History, Past Surgical History, Family History and Social History were reviewed in [employer] record.    Objective:   Physical Exam Ht 5' 3"  (1.6 m)   Wt 157 lb 8 oz (71.4 kg)   BMI 27.90 kg/m  Gen: awake, alert, NAD HEENT: anicteric, op clear CV: RRR, no mrg Pulm: CTA b/l Abd: soft, NT/ND, +BS throughout Ext: no c/c/e Neuro:  nonfocal  CBC    Component Value Date/Time   WBC 7.7 09/01/2020 1022   WBC 12.0 (H) 08/15/2020 1025   RBC 4.84 09/01/2020 1022   RBC 4.81 09/01/2020 1022   HGB 13.2 09/01/2020 1022   HGB 12.0 01/11/2017 1514   HCT 42.3 09/01/2020 1022   HCT 36.7 01/11/2017 1514   PLT 324 09/01/2020 1022   PLT 313 01/11/2017 1514   MCV 87.4 09/01/2020 1022   MCV 89 01/11/2017 1514   MCH 27.3 09/01/2020 1022   MCHC 31.2 09/01/2020 1022   RDW 13.2 09/01/2020 1022   RDW 12.7 01/11/2017 1514   LYMPHSABS 3.7 09/01/2020 1022   LYMPHSABS 2.9 01/11/2017 1514   MONOABS 0.6 09/01/2020 1022   EOSABS 0.2 09/01/2020 1022   EOSABS 0.2 01/11/2017 1514   BASOSABS 0.0 09/01/2020 1022   BASOSABS 0.0 01/11/2017 1514   Iron/TIBC/Ferritin/ %Sat    Component Value Date/Time   IRON 72 09/01/2020 1022   IRON 70 01/11/2017 1514   TIBC 425 09/01/2020 1022   TIBC 299 01/11/2017 1514   FERRITIN 54 09/01/2020 1022   FERRITIN 25 01/11/2017 1514   IRONPCTSAT 17 (L) 09/01/2020 1022   IRONPCTSAT 23 01/11/2017 1514        Assessment & Plan:  35 year old female with a history of fistulizing and stricturing ileal Crohn's disease 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   Fistulizing and stricturing ileal Crohn's disease (right hemicolectomy with enterocolonic fistula repair June 2021)/infliximab therapy since August 2021 --She is doing well with no flare of her disease in the peri or postpartum period. -- Continue infliximab every 8 weeks; she states that she now has a $170 charge for each infusion at [facility]; unclear because it did not cause this previously.  We will investigate and she should call her insurance --Resume low-dose azathioprine 50 mg daily  when she stops breast-feeding --69-monthfollow-up with me  2.  B12 deficiency --resume oral B12, she has been not taking this every day recently.  3.  IDA --following with hematology with plans for IV iron tomorrow.  She has needed less iron since her surgery last year.  30 minutes total spent today including patient facing time, coordination of care, reviewing medical history/procedures/pertinent radiology studies, and documentation of the encounter.

## 2020-09-08 NOTE — Patient Instructions (Signed)
Continue Remicade and Vitamin B12.  Resume Azathioprine 50 mg one tablet by mouth daily after you have finished breast feeding. A new prescription has been sent to your pharmacy.   Follow up with Dr. Hilarie Fredrickson in 6 months.   Thank you for entrusting me with your care and for choosing [facility], Dr. Zenovia Jarred

## 2020-09-09 ENCOUNTER — Other Ambulatory Visit: Payer: Self-pay

## 2020-09-09 ENCOUNTER — Inpatient Hospital Stay: Payer: Commercial Managed Care - PPO

## 2020-09-09 VITALS — BP 102/68 | HR 66 | Temp 98.7°F | Resp 18

## 2020-09-09 DIAGNOSIS — D500 Iron deficiency anemia secondary to blood loss (chronic): Secondary | ICD-10-CM | POA: Diagnosis not present

## 2020-09-09 DIAGNOSIS — D508 Other iron deficiency anemias: Secondary | ICD-10-CM

## 2020-09-09 MED ORDER — SODIUM CHLORIDE 0.9 % IV SOLN: INTRAVENOUS | Status: DC

## 2020-09-09 MED ORDER — SODIUM CHLORIDE 0.9 % IV SOLN
200.0000 mg | Freq: Once | INTRAVENOUS | Status: AC
  Administered 2020-09-09: 200 mg via INTRAVENOUS
  Filled 2020-09-09: qty 200

## 2020-09-09 NOTE — Patient Instructions (Signed)

## 2020-09-15 ENCOUNTER — Telehealth: Payer: Self-pay

## 2020-09-16 ENCOUNTER — Ambulatory Visit: Payer: Commercial Managed Care - PPO

## 2020-09-21 ENCOUNTER — Encounter: Payer: Self-pay | Admitting: Family Medicine

## 2020-09-21 ENCOUNTER — Other Ambulatory Visit: Payer: Self-pay

## 2020-09-21 ENCOUNTER — Inpatient Hospital Stay: Payer: Commercial Managed Care - PPO

## 2020-09-21 VITALS — BP 118/73 | HR 62 | Temp 98.0°F | Resp 18

## 2020-09-21 DIAGNOSIS — D500 Iron deficiency anemia secondary to blood loss (chronic): Secondary | ICD-10-CM | POA: Diagnosis not present

## 2020-09-21 DIAGNOSIS — D508 Other iron deficiency anemias: Secondary | ICD-10-CM

## 2020-09-21 MED ORDER — SODIUM CHLORIDE 0.9 % IV SOLN
200.0000 mg | Freq: Once | INTRAVENOUS | Status: AC
  Administered 2020-09-21: 200 mg via INTRAVENOUS
  Filled 2020-09-21: qty 200

## 2020-09-21 MED ORDER — SODIUM CHLORIDE 0.9 % IV SOLN: Freq: Once | INTRAVENOUS | Status: AC

## 2020-09-21 NOTE — Patient Instructions (Signed)

## 2020-09-23 ENCOUNTER — Ambulatory Visit: Payer: Commercial Managed Care - PPO

## 2020-09-24 ENCOUNTER — Other Ambulatory Visit: Payer: Self-pay

## 2020-09-24 ENCOUNTER — Inpatient Hospital Stay: Payer: Commercial Managed Care - PPO | Attending: Hematology & Oncology

## 2020-09-24 VITALS — BP 115/78 | HR 71 | Temp 98.0°F | Resp 17

## 2020-09-24 DIAGNOSIS — K5090 Crohn's disease, unspecified, without complications: Secondary | ICD-10-CM | POA: Insufficient documentation

## 2020-09-24 DIAGNOSIS — D500 Iron deficiency anemia secondary to blood loss (chronic): Secondary | ICD-10-CM | POA: Diagnosis present

## 2020-09-24 DIAGNOSIS — D508 Other iron deficiency anemias: Secondary | ICD-10-CM

## 2020-09-24 MED ORDER — SODIUM CHLORIDE 0.9 % IV SOLN
200.0000 mg | Freq: Once | INTRAVENOUS | Status: AC
  Administered 2020-09-24: 200 mg via INTRAVENOUS
  Filled 2020-09-24: qty 200

## 2020-09-24 MED ORDER — SODIUM CHLORIDE 0.9 % IV SOLN: Freq: Once | INTRAVENOUS | Status: AC

## 2020-09-24 NOTE — Patient Instructions (Signed)

## 2020-09-24 NOTE — Progress Notes (Signed)
Pt declined to stay for post infusion observation period. Pt stated she has tolerated medication multiple times prior without difficulty. Pt aware to call clinic with any questions or concerns. Pt verbalized understanding and had no further questions.  ? ?

## 2020-09-30 ENCOUNTER — Other Ambulatory Visit: Payer: Self-pay

## 2020-09-30 ENCOUNTER — Inpatient Hospital Stay: Payer: Commercial Managed Care - PPO

## 2020-09-30 VITALS — BP 111/73 | HR 70 | Temp 98.1°F | Resp 17

## 2020-09-30 DIAGNOSIS — D508 Other iron deficiency anemias: Secondary | ICD-10-CM

## 2020-09-30 DIAGNOSIS — D500 Iron deficiency anemia secondary to blood loss (chronic): Secondary | ICD-10-CM | POA: Diagnosis not present

## 2020-09-30 MED ORDER — SODIUM CHLORIDE 0.9 % IV SOLN
200.0000 mg | Freq: Once | INTRAVENOUS | Status: AC
  Administered 2020-09-30: 200 mg via INTRAVENOUS
  Filled 2020-09-30: qty 200

## 2020-09-30 NOTE — Patient Instructions (Signed)

## 2020-09-30 NOTE — Progress Notes (Signed)
Pt declined to stay for post infusion observation period. Pt stated she has tolerated medication multiple times prior without difficulty. Pt aware to call clinic with any questions or concerns. Pt verbalized understanding and had no further questions.  ? ?

## 2020-10-21 ENCOUNTER — Other Ambulatory Visit: Payer: Self-pay

## 2020-10-21 ENCOUNTER — Ambulatory Visit (INDEPENDENT_AMBULATORY_CARE_PROVIDER_SITE_OTHER): Payer: Commercial Managed Care - PPO | Admitting: Medical

## 2020-10-21 VITALS — BP 123/80 | HR 82 | Temp 98.4°F | Resp 18 | Ht 63.0 in | Wt 154.9 lb

## 2020-10-21 DIAGNOSIS — H6690 Otitis media, unspecified, unspecified ear: Secondary | ICD-10-CM | POA: Diagnosis not present

## 2020-10-21 DIAGNOSIS — H60391 Other infective otitis externa, right ear: Secondary | ICD-10-CM | POA: Diagnosis not present

## 2020-10-21 MED ORDER — AMOXICILLIN-POT CLAVULANATE 875-125 MG PO TABS: 1.0000 | ORAL_TABLET | Freq: Two times a day (BID) | ORAL | 0 refills | Status: DC

## 2020-10-21 MED ORDER — NEOMYCIN-POLYMYXIN-HC 3.5-10000-1 OT SUSP: 3.0000 [drp] | Freq: Three times a day (TID) | OTIC | 0 refills | Status: DC

## 2020-10-21 NOTE — Progress Notes (Signed)
Subjective:    Patient ID: Carla Clark, female    DOB: [birth year], 35 y.o.   MRN: [id number]  HPI  Pt in for some rt ear pain since Sunday. Mild ear pain intially. Pain has been increasing. Pt state today moderate to severe. Needing to alternate tylenol and ibuprofen for the pain. Pt has hx of eczema in her ears. Pt indcates occasional ear infections but no ear tube placement.  Pt states had hoarse voice before ear pain started. No obvious severe congestion.  Pt is nursing 2 month baby girl.    Review of Systems  Constitutional:  Negative for chills, fatigue and fever.  HENT:  Positive for ear pain. Negative for congestion, hearing loss and mouth sores.   Respiratory:  Negative for cough, chest tightness, shortness of breath and wheezing.   Cardiovascular:  Negative for chest pain and palpitations.  Gastrointestinal:  Negative for abdominal pain, constipation, nausea and rectal pain.  Genitourinary:  Negative for dyspareunia, enuresis and frequency.  Musculoskeletal:  Negative for back pain, myalgias and neck pain.  Skin:  Negative for rash.    Past Medical History:  Diagnosis Date   Anal fissure    Anemia 11/2011   iron deficiency. 01/2013 parenteral iron. Intranasal B12.  Dr Marin Olp follows.    B12 deficiency 11/2012   Breast mass in female 04/21/2012   Cervical cancer screening 01/27/2012   Chicken pox as a child   Crohn's disease ([place]) 2013   ileitis 10/2011. ileitis, mesenteric abscess, ? entero-enteric fistula on 08/2012 CT enterography   Dermatitis 08/22/2016   GERD (gastroesophageal reflux disease)    Hepatitis B immune 07/2012   previous vaccination.    Hives 09/2014   per Dermatologist.  Rx Zyrtec, Benadryl.    Ileitis    Serrated polyp of colon    Small bowel obstruction (HCC)    SVD (spontaneous vaginal delivery) 08/13/2018   Vitamin D deficiency 2014     Social History   Socioeconomic History   Marital status: Married    Spouse name: Not on file    Number of children: 1   Years of education: Not on file   Highest education level: Not on file  Occupational History   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  Tobacco Use   Smoking status: Never   Smokeless tobacco: Never   Tobacco comments:    never used tobacco  Vaping Use   Vaping Use: Never used  Substance and Sexual Activity   Alcohol use: Not Currently    Alcohol/week: 0.0 standard drinks    Comment: occasionaly   Drug use: No   Sexual activity: Yes    Partners: Male    Birth control/protection: None  Other Topics Concern   Not on file  Social History Narrative   Not on file   Social Determinants of Health   Financial Resource Strain: Not on file  Food Insecurity: Not on file  Transportation Needs: Not on file  Physical Activity: Not on file  Stress: Not on file  Social Connections: Not on file  Intimate Partner Violence: Not on file    Past Surgical History:  Procedure Laterality Date   LAPAROSCOPIC RIGHT HEMI COLECTOMY  07/11/2019   WISDOM TOOTH EXTRACTION  35 yrs old    Family History  Problem Relation Age of Onset   Nephrolithiasis Father    Brain cancer Father    Lung cancer Maternal Grandmother    Gout Maternal Grandfather  Heart attack Maternal Grandfather    Ovarian cancer Paternal Grandmother    Cancer Paternal Grandmother 68       ovarian   Pancreatic cancer Paternal Grandfather    Ovarian cancer Paternal Aunt        ovarian   Breast cancer Paternal Aunt    Cancer Paternal Aunt        breast (2019) and ovarian cancer (2005)   Other Paternal Uncle 33       cyst removed from pancreas   Pancreatic cancer Paternal Uncle        Stage 1   Cancer Paternal Uncle        pancreatic   Colon cancer Neg Hx    Stomach cancer Neg Hx    Esophageal cancer Neg Hx     No Known Allergies  Current Outpatient Medications on File Prior to Visit  Medication Sig Dispense Refill   azaTHIOprine (IMURAN) 50 MG tablet Take 1 tablet (50 mg  total) by mouth daily. 30 tablet 5   inFLIXimab (REMICADE) 100 MG injection Inject into the vein every 8 (eight) weeks.     Prenatal Vit-Fe Fumarate-FA (PRENATAL VITAMIN PO) Take 4 tablets by mouth daily.     vitamin B-12 (CYANOCOBALAMIN) 100 MCG tablet Take 100 mcg by mouth daily.     No current facility-administered medications on file prior to visit.    BP 123/80   Pulse 82   Temp 98.4 F (36.9 C)   Resp 18   Ht 5' 3"  (1.6 m)   Wt 154 lb 14.4 oz (70.3 kg)   SpO2 100%   BMI 27.44 kg/m        Objective:   Physical Exam  General- No acute distress. Pleasant patient. Neck- Full range of motion, no jvd Lungs- Clear, even and unlabored. Heart- regular rate and rhythm. Neurologic- CNII- XII grossly intact.   Heent- no sinus pressure. Rt ear canal swollen. Small portion of tm seen normal. No tragal tenderness. Left canal normal-tm moderate bright red upper 2/3.        Assessment & Plan:   Patient Instructions  Otitis externa rt side. Rx cortisporin otic drops.  For ear infection left side and possible rt side as well. Rx augmentin. Rx advisement give regarding breast feeding.  If signs/symptoms worsen or change notify us.  Follow up 10-14 days or sooner if needed.   Mackie Pai, PA-C

## 2020-10-21 NOTE — Patient Instructions (Signed)
Otitis externa rt side. Rx cortisporin otic drops.  For ear infection left side and possible rt side as well. Rx augmentin. Rx advisement give regarding breast feeding.  If signs/symptoms worsen or change notify us.  Follow up 10-14 days or sooner if needed.

## 2020-11-03 ENCOUNTER — Other Ambulatory Visit: Payer: Self-pay

## 2020-11-03 ENCOUNTER — Ambulatory Visit (INDEPENDENT_AMBULATORY_CARE_PROVIDER_SITE_OTHER): Payer: Commercial Managed Care - PPO | Admitting: Medical

## 2020-11-03 VITALS — BP 112/80 | HR 72 | Resp 16 | Ht 63.0 in | Wt 155.0 lb

## 2020-11-03 DIAGNOSIS — H6690 Otitis media, unspecified, unspecified ear: Secondary | ICD-10-CM

## 2020-11-03 MED ORDER — AZITHROMYCIN 250 MG PO TABS: ORAL_TABLET | ORAL | 0 refills | Status: AC

## 2020-11-03 NOTE — Patient Instructions (Signed)
Resolved right side otitis media but on exam left side upper one third of eardrum/tympanic membrane still red.  Much improved looks like residual infection present left side.  Discussed possibly using Rocephin 1 g injection versus 5 days additional azithromycin.  After discussion patient chose azithromycin.  Rx advisement given.  Category B medication.   At this point recommend follow-up only as needed.  Notify us of any questions or concerns.

## 2020-11-03 NOTE — Progress Notes (Signed)
Subjective:    Patient ID: Carla Clark, female    DOB: [birth year], 35 y.o.   MRN: [id number]  HPI  Here for follow up.  Both ears feel fine with no pain and normal hearing.  Rt om on last exam and left lt.  Pt used cortisportin otic and finished augmentin on Sunday.   Remote hx of ear infection before recent events. Maybe 20 years prior.    Review of Systems  Constitutional:  Negative for chills, fatigue and fever.  HENT:  Negative for congestion and ear discharge.   Respiratory:  Negative for cough, chest tightness, shortness of breath and wheezing.   Cardiovascular:  Negative for chest pain and palpitations.  Gastrointestinal:  Negative for abdominal pain, constipation, nausea and vomiting.  Genitourinary:  Negative for dysuria, enuresis and flank pain.  Musculoskeletal:  Negative for back pain and gait problem.    Past Medical History:  Diagnosis Date   Anal fissure    Anemia 11/2011   iron deficiency. 01/2013 parenteral iron. Intranasal B12.  Dr Marin Olp follows.    B12 deficiency 11/2012   Breast mass in female 04/21/2012   Cervical cancer screening 01/27/2012   Chicken pox as a child   Crohn's disease ([place]) 2013   ileitis 10/2011. ileitis, mesenteric abscess, ? entero-enteric fistula on 08/2012 CT enterography   Dermatitis 08/22/2016   GERD (gastroesophageal reflux disease)    Hepatitis B immune 07/2012   previous vaccination.    Hives 09/2014   per Dermatologist.  Rx Zyrtec, Benadryl.    Ileitis    Serrated polyp of colon    Small bowel obstruction (HCC)    SVD (spontaneous vaginal delivery) 08/13/2018   Vitamin D deficiency 2014     Social History   Socioeconomic History   Marital status: Married    Spouse name: Not on file   Number of children: 1   Years of education: Not on file   Highest education level: Not on file  Occupational History   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  Tobacco Use   Smoking status: Never    Smokeless tobacco: Never   Tobacco comments:    never used tobacco  Vaping Use   Vaping Use: Never used  Substance and Sexual Activity   Alcohol use: Not Currently    Alcohol/week: 0.0 standard drinks    Comment: occasionaly   Drug use: No   Sexual activity: Yes    Partners: Male    Birth control/protection: None  Other Topics Concern   Not on file  Social History Narrative   Not on file   Social Determinants of Health   Financial Resource Strain: Not on file  Food Insecurity: Not on file  Transportation Needs: Not on file  Physical Activity: Not on file  Stress: Not on file  Social Connections: Not on file  Intimate Partner Violence: Not on file    Past Surgical History:  Procedure Laterality Date   LAPAROSCOPIC RIGHT HEMI COLECTOMY  07/11/2019   WISDOM TOOTH EXTRACTION  35 yrs old    Family History  Problem Relation Age of Onset   Nephrolithiasis Father    Brain cancer Father    Lung cancer Maternal Grandmother    Gout Maternal Grandfather    Heart attack Maternal Grandfather    Ovarian cancer Paternal Grandmother    Cancer Paternal Grandmother 83       ovarian   Pancreatic cancer Paternal Grandfather    Ovarian cancer Paternal  Aunt        ovarian   Breast cancer Paternal Aunt    Cancer Paternal Aunt        breast (2019) and ovarian cancer (2005)   Other Paternal Uncle 74       cyst removed from pancreas   Pancreatic cancer Paternal Uncle        Stage 1   Cancer Paternal Uncle        pancreatic   Colon cancer Neg Hx    Stomach cancer Neg Hx    Esophageal cancer Neg Hx     No Known Allergies  Current Outpatient Medications on File Prior to Visit  Medication Sig Dispense Refill   azaTHIOprine (IMURAN) 50 MG tablet Take 1 tablet (50 mg total) by mouth daily. 30 tablet 5   inFLIXimab (REMICADE) 100 MG injection Inject into the vein every 8 (eight) weeks.     neomycin-polymyxin-hydrocortisone (CORTISPORIN) 3.5-10000-1 OTIC suspension Place 3 drops into  the right ear 3 (three) times daily. 10 mL 0   Prenatal Vit-Fe Fumarate-FA (PRENATAL VITAMIN PO) Take 4 tablets by mouth daily.     vitamin B-12 (CYANOCOBALAMIN) 100 MCG tablet Take 100 mcg by mouth daily.     No current facility-administered medications on file prior to visit.    BP 112/80   Pulse 72   Resp 16   Ht 5' 3"  (1.6 m)   Wt 155 lb (70.3 kg)   SpO2 100%   BMI 27.46 kg/m       Objective:   Physical Exam   General- No acute distress. Pleasant patient. Lungs- Clear, even and unlabored. Heart- regular rate and rhythm. Neurologic- CNII- XII grossly intact.  Heent- rt canal normal and norma tm. Left canal clear-upper approximate 1/3 portion right red. Much improved cmpared to prior   Assessment & Plan:   Patient Instructions  Resolved right side otitis media but on exam left side upper one third of eardrum/tympanic membrane still red.  Much improved looks like residual infection present left side.  Discussed possibly using Rocephin 1 g injection versus 5 days additional azithromycin.  After discussion patient chose azithromycin.  Rx advisement given.  Category B medication.   At this point recommend follow-up only as needed.  Notify us of any questions or concerns.   Mackie Pai, PA-C

## 2020-11-05 ENCOUNTER — Telehealth: Payer: Self-pay | Admitting: Internal Medicine

## 2020-11-05 NOTE — Telephone Encounter (Signed)
Inbound call from Carla Clark from [place] requesting a call back wanting to know about her Inslectra. Best contact information is [phone]. Please advise. Thank you.

## 2020-11-05 NOTE — Telephone Encounter (Signed)
Spoke with the nurse. Verbal authorization to begin the infusion of Inflectra given per Dr Bryan Lemma. Written order faxed.

## 2020-11-05 NOTE — Telephone Encounter (Signed)
I spoke with the patient. She has been given antibiotics for "a double ear infection today." Denies fever.  Can she still have her infusion for Crohn's using bio-similar to Remicade.

## 2021-01-01 ENCOUNTER — Ambulatory Visit: Payer: Commercial Managed Care - PPO | Admitting: Family

## 2021-01-01 ENCOUNTER — Other Ambulatory Visit: Payer: Commercial Managed Care - PPO

## 2021-01-12 ENCOUNTER — Inpatient Hospital Stay: Payer: Commercial Managed Care - PPO | Attending: Hematology & Oncology

## 2021-01-12 ENCOUNTER — Inpatient Hospital Stay (HOSPITAL_BASED_OUTPATIENT_CLINIC_OR_DEPARTMENT_OTHER): Payer: Commercial Managed Care - PPO | Admitting: Family

## 2021-01-12 ENCOUNTER — Other Ambulatory Visit: Payer: Self-pay

## 2021-01-12 ENCOUNTER — Encounter: Payer: Self-pay | Admitting: Family

## 2021-01-12 VITALS — BP 112/70 | HR 77 | Temp 98.5°F | Wt 146.8 lb

## 2021-01-12 DIAGNOSIS — D500 Iron deficiency anemia secondary to blood loss (chronic): Secondary | ICD-10-CM

## 2021-01-12 DIAGNOSIS — Z79899 Other long term (current) drug therapy: Secondary | ICD-10-CM | POA: Insufficient documentation

## 2021-01-12 DIAGNOSIS — K5090 Crohn's disease, unspecified, without complications: Secondary | ICD-10-CM | POA: Insufficient documentation

## 2021-01-12 DIAGNOSIS — K909 Intestinal malabsorption, unspecified: Secondary | ICD-10-CM | POA: Insufficient documentation

## 2021-01-12 LAB — CBC WITH DIFFERENTIAL (CANCER CENTER ONLY)
Abs Immature Granulocytes: 0.02 10*3/uL (ref 0.00–0.07)
Basophils Absolute: 0 10*3/uL (ref 0.0–0.1)
Basophils Relative: 0 %
Eosinophils Absolute: 0.1 10*3/uL (ref 0.0–0.5)
Eosinophils Relative: 2 %
HCT: 42.5 % (ref 36.0–46.0)
Hemoglobin: 13.8 g/dL (ref 12.0–15.0)
Immature Granulocytes: 0 %
Lymphocytes Relative: 56 %
Lymphs Abs: 3.9 10*3/uL (ref 0.7–4.0)
MCH: 28.5 pg (ref 26.0–34.0)
MCHC: 32.5 g/dL (ref 30.0–36.0)
MCV: 87.8 fL (ref 80.0–100.0)
Monocytes Absolute: 0.5 10*3/uL (ref 0.1–1.0)
Monocytes Relative: 7 %
Neutro Abs: 2.5 10*3/uL (ref 1.7–7.7)
Neutrophils Relative %: 35 %
Platelet Count: 267 10*3/uL (ref 150–400)
RBC: 4.84 MIL/uL (ref 3.87–5.11)
RDW: 13.2 % (ref 11.5–15.5)
WBC Count: 7 10*3/uL (ref 4.0–10.5)
nRBC: 0 % (ref 0.0–0.2)

## 2021-01-12 LAB — IRON AND IRON BINDING CAPACITY (CC-WL,HP ONLY)
Iron: 108 ug/dL (ref 28–170)
Saturation Ratios: 36 % — ABNORMAL HIGH (ref 10.4–31.8)
TIBC: 304 ug/dL (ref 250–450)
UIBC: 196 ug/dL (ref 148–442)

## 2021-01-12 LAB — FERRITIN: Ferritin: 176 ng/mL (ref 11–307)

## 2021-01-12 LAB — RETICULOCYTES
Immature Retic Fract: 3.8 % (ref 2.3–15.9)
RBC.: 4.84 MIL/uL (ref 3.87–5.11)
Retic Count, Absolute: 36.3 10*3/uL (ref 19.0–186.0)
Retic Ct Pct: 0.8 % (ref 0.4–3.1)

## 2021-01-12 NOTE — Progress Notes (Signed)
Hematology and Oncology Follow Up Visit  Carla Clark [id number] 1985/03/23 35 y.o. 01/12/2021   Principle Diagnosis:  Iron deficiency anemia secondary to chronic blood loss due to Crohn's disease   Current Therapy:        IV iron as indicated    Interim History:  [name] is here today for follow-up. She is doing fairly well. She has occasional pressure in the top of her abdomen and is not sure if this is related to GERD or food. No recent flares with her Crohn's.  She has started a new birth control and her cycles has been a little irregular but flow is normal.  No other blood loss noted. No bruising or petechiae.  No fever, chills, n/v, cough, dizziness, SOB, chest pain, palpitations, abdominal pain or changes in bowel or bladder habits.  She has eczema on her hands. No swelling, tenderness, numbness or tingling in her extremities.  No falls or syncope. She has a good appetite and is staying well hydrated. Her weight is stable at 146 lbs.   ECOG Performance Status: 1 - Symptomatic but completely ambulatory  Medications:  Allergies as of 01/12/2021   No Known Allergies      Medication List        Accurate as of January 12, 2021 10:07 AM. If you have any questions, ask your nurse or doctor.          azaTHIOprine 50 MG tablet Commonly known as: IMURAN Take 1 tablet (50 mg total) by mouth daily.   inFLIXimab 100 MG injection Commonly known as: REMICADE Inject into the vein every 8 (eight) weeks.   Mirena (52 MG) 20 MCG/DAY Iud Generic drug: levonorgestrel Mirena 20 mcg/24 hours (8 yrs) 52 mg intrauterine device  Take by intrauterine route.   neomycin-polymyxin-hydrocortisone 3.5-10000-1 OTIC suspension Commonly known as: CORTISPORIN Place 3 drops into the right ear 3 (three) times daily.   PRENATAL VITAMIN PO Take 4 tablets by mouth daily.   vitamin B-12 100 MCG tablet Commonly known as: CYANOCOBALAMIN Take 100 mcg by mouth daily.         Allergies: No Known Allergies  Past Medical History, Surgical history, Social history, and Family History were reviewed and updated.  Review of Systems: All other 10 point review of systems is negative.   Physical Exam:  weight is 146 lb 12.8 oz (66.6 kg). Her oral temperature is 98.5 F (36.9 C). Her blood pressure is 112/70 and her pulse is 77. Her oxygen saturation is 100%.   Wt Readings from Last 3 Encounters:  01/12/21 146 lb 12.8 oz (66.6 kg)  11/03/20 155 lb (70.3 kg)  10/21/20 154 lb 14.4 oz (70.3 kg)    Ocular: Sclerae unicteric, pupils equal, round and reactive to light Ear-nose-throat: Oropharynx clear, dentition fair Lymphatic: No cervical or supraclavicular adenopathy Lungs no rales or rhonchi, good excursion bilaterally Heart regular rate and rhythm, no murmur appreciated Abd soft, nontender, positive bowel sounds MSK no focal spinal tenderness, no joint edema Neuro: non-focal, well-oriented, appropriate affect Breasts: Deferred   Lab Results  Component Value Date   WBC 7.0 01/12/2021   HGB 13.8 01/12/2021   HCT 42.5 01/12/2021   MCV 87.8 01/12/2021   PLT 267 01/12/2021   Lab Results  Component Value Date   FERRITIN 54 09/01/2020   IRON 72 09/01/2020   TIBC 425 09/01/2020   UIBC 353 09/01/2020   IRONPCTSAT 17 (L) 09/01/2020   Lab Results  Component Value Date   RETICCTPCT  0.8 01/12/2021   RBC 4.84 01/12/2021   RBC 4.84 01/12/2021   RETICCTABS 13.7 (L) 01/20/2015   No results found for: KPAFRELGTCHN, LAMBDASER, [facility] Lab Results  Component Value Date   IGA 187 03/11/2016   No results found for: Ronnald Ramp, A1GS, Nelida Meuse, SPEI   Chemistry      Component Value Date/Time   NA 136 09/16/2019 1628   K 4.0 09/16/2019 1628   CL 104 09/16/2019 1628   CO2 26 09/16/2019 1628   BUN 12 09/16/2019 1628   CREATININE 0.91 09/16/2019 1628   CREATININE 0.62 08/20/2012 1139      Component Value  Date/Time   CALCIUM 9.7 09/16/2019 1628   ALKPHOS 56 09/16/2019 1628   AST 10 09/16/2019 1628   ALT 12 09/16/2019 1628   BILITOT 0.3 09/16/2019 1628       Impression and Plan: Carla Clark is a very pleasant 35 yo caucasian female with history of iron deficiency anemia secondary to 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  Iron studies are pending.  Follow-up in 4 months.   Lottie Dawson, NP 12/20/202210:07 AM

## 2021-01-13 ENCOUNTER — Telehealth: Payer: Self-pay | Admitting: *Deleted

## 2021-01-13 NOTE — Telephone Encounter (Signed)
Per 01/12/21 los - called and gave upcoming appointments - confirmed

## 2021-03-05 ENCOUNTER — Telehealth: Payer: Self-pay | Admitting: Internal Medicine

## 2021-03-05 NOTE — Telephone Encounter (Signed)
I have spoken to [facility] regarding [name]. Apparently, patient has had chronic rash on the inside and edge of both ears. Over the last several days, she has experienced a similar rash across the forehead, nose and sides of the mouth. Patient had NOT received the infusion at the time of their phone call to our office. I have spoken to Dr Hilarie Fredrickson who feels it is okay for patient to move forward with inflectra today since this rash is not new. He also indicates we may need to send [name] for a dermatology consult. [facility] nurse has been given the okay to infuse inflectra today.  Dr Hilarie Fredrickson,  Any particular dermatology office you want me to refer to or whomever I can get her in with soonest?

## 2021-03-05 NOTE — Telephone Encounter (Signed)
Bree from [facility] called for this patient.  She is doing an infusion of Inflectra today and has developed a rash across her face.  She said this was one of the signs they were supposed to report urgently.  Please call her back at the following number:  [phone], extension [phone]  Thank you.

## 2021-03-05 NOTE — Telephone Encounter (Signed)
Typically [facility], but if long wait, then 1st available ok

## 2021-03-09 NOTE — Telephone Encounter (Addendum)
Patient has been scheduled for appointment at [facility] on Thursday, 06/17/21 at 1020 am (first available appointment) for further evaluation of rash in IBD patient.  Records faxed to [facility] at [phone].  I have left a message for patient to call back so we can advise of appointment.

## 2021-03-12 NOTE — Telephone Encounter (Signed)
Left message for patient to call back  

## 2021-03-12 NOTE — Telephone Encounter (Signed)
Patient has been advised of appointment with [facility]. She verbalizes understanding and states she has actually already received an email from them with her appointment information.

## 2021-03-30 ENCOUNTER — Encounter: Payer: Self-pay | Admitting: Internal Medicine

## 2021-03-30 ENCOUNTER — Ambulatory Visit (INDEPENDENT_AMBULATORY_CARE_PROVIDER_SITE_OTHER): Payer: Commercial Managed Care - PPO | Admitting: Internal Medicine

## 2021-03-30 VITALS — BP 110/62 | HR 76 | Ht 63.0 in | Wt 141.0 lb

## 2021-03-30 DIAGNOSIS — R21 Rash and other nonspecific skin eruption: Secondary | ICD-10-CM

## 2021-03-30 DIAGNOSIS — D500 Iron deficiency anemia secondary to blood loss (chronic): Secondary | ICD-10-CM | POA: Diagnosis not present

## 2021-03-30 DIAGNOSIS — K50013 Crohn's disease of small intestine with fistula: Secondary | ICD-10-CM | POA: Diagnosis not present

## 2021-03-30 NOTE — Patient Instructions (Addendum)
Your provider has requested that you go to the basement level for lab work before leaving today. Press "B" on the elevator. The lab is located at the first door on the left as you exit the elevator. ? ?Please follow up with Dr Hilarie Fredrickson on 06/07/21 at 10:10 am. ? ?If you are age 34 or older, your body mass index should be between 23-30. Your Body mass index is 24.98 kg/m?Marland Kitchen If this is out of the aforementioned range listed, please consider follow up with your Primary Care Provider. ? ?If you are age 72 or younger, your body mass index should be between 19-25. Your Body mass index is 24.98 kg/m?Marland Kitchen If this is out of the aformentioned range listed, please consider follow up with your Primary Care Provider.  ? ?________________________________________________________ ? ?The [facility] providers would like to encourage you to use [facility] to communicate with providers for non-urgent requests or questions.  Due to long hold times on the telephone, sending your provider a message by [facility] may be a faster and more efficient way to get a response.  Please allow 48 business hours for a response.  Please remember that this is for non-urgent requests.  ?_______________________________________________________ ?Due to recent changes in healthcare laws, you may see the results of your imaging and laboratory studies on MyChart before your provider has had a chance to review them.  We understand that in some cases there may be results that are confusing or concerning to you. Not all laboratory results come back in the same time frame and the provider may be waiting for multiple results in order to interpret others.  Please give [place] 48 hours in order for your provider to thoroughly review all the results before contacting the office for clarification of your results.  ? ?

## 2021-03-31 ENCOUNTER — Encounter: Payer: Self-pay | Admitting: Internal Medicine

## 2021-03-31 NOTE — Progress Notes (Signed)
? ?Subjective:  ? ? Patient ID: Carla Clark, female    DOB: February 16, 1985, 36 y.o.   MRN: [id number] ? 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  She is here alone today and was last seen on 09/08/2020. ? ?She reports that she is doing okay but has developed worsening rash which involves initially her ear canal in October but has extended to involve her forehead down to the corners of her eyes and nose, bilateral hands, perineum including perianal skin.  She describes this rash as itchy and annoying.  She has been using cortisone cream which she finds a little relief from.  She is waiting to get into dermatology. ? ?From a GI perspective she is not having abdominal pain.  Her appetite has been normal without significant nausea or vomiting.  Bowel movements are regular though at times more loose if she eats salads.  She has no blood in stool or melena.  When she uses azathioprine her stools are definitively loose and she has diarrhea so she has not resumed this.  She has been getting infliximab infusions the last was around March 02, 2021.  The rash was present prior to her most recent infusion. ? ?She did have a Mirena put in in September. ? ? ?Review of Systems ?As per HPI, otherwise negative ? ?Current Medications, Allergies, Past Medical History, Past Surgical History, Family History and Social History were reviewed in [employer] record. ?   ?Objective:  ? Physical Exam ?BP 110/62   Pulse 76   Ht 5' 3"  (1.6 m)   Wt 141 lb (64 kg)   BMI 24.98 kg/m?  ?Gen: awake, alert, NAD ?HEENT: anicteric ?CV: RRR, no mrg ?Pulm: CTA  b/l ?Abd: soft, NT/ND, +BS throughout ?Skin: Erythematous and raised rash with scattered excoriation over her central forehead and extending to the medial folds of the eye and nose, bilateral hand in a glove type pattern ?Ext: no c/c/e ?Neuro: nonfocal ? ? ?   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  ? ?Rash --I am concerned that this may be a rash related to infliximab therapy but also cannot rule out another inflammatory autoimmune conditions such as psoriasis.  Does not seem consistent with dermatitis herpetiformis (she does not have known celiac disease) ?-- Dermatology evaluation; I messaged Dr. Wilhemina Bonito to see if he could see her sooner than her currently scheduled appointment in May ?--I would like to get her seen by dermatology before her next infliximab infusion which is scheduled for 04/30/2021 ?--She can continue hydrocortisone cream but I am going to avoid oral steroids until she is seen by dermatology ?--TTG and IgA ? ?2.  Fistulizing and stricturing ileal Crohn's disease status post right hemicolectomy with enterocolonic fistula repair in June 2021 (infliximab therapy since August 2021) --GI disease currently well controlled though she is  intolerant of azathioprine.  Possible drug rash ?--For now we will likely continue infliximab every 8 weeks but see #1 ?--Intolerant of azathioprine due to significant diarrhea; she will remain off azathioprine for now ?--CBC, CMP, CRP and infliximab level ? ?3.  B12 deficiency --resume oral B12 ? ?4.  IDA --likely previously secondary to #2.  She is following with hematology and has received IV  iron ? ?Follow-up with me in 6 to 10 weeks ?Await dermatology opinion ? ?30 minutes total spent today including patient facing time, coordination of care, reviewing medical history/procedures/pertinent radiology studies, and documentation of the encounter. ? ? ?

## 2021-04-02 ENCOUNTER — Other Ambulatory Visit (INDEPENDENT_AMBULATORY_CARE_PROVIDER_SITE_OTHER): Payer: Commercial Managed Care - PPO

## 2021-04-02 DIAGNOSIS — K50013 Crohn's disease of small intestine with fistula: Secondary | ICD-10-CM

## 2021-04-02 DIAGNOSIS — D500 Iron deficiency anemia secondary to blood loss (chronic): Secondary | ICD-10-CM

## 2021-04-02 LAB — CBC WITH DIFFERENTIAL/PLATELET
Basophils Absolute: 0 10*3/uL (ref 0.0–0.1)
Basophils Relative: 0.4 % (ref 0.0–3.0)
Eosinophils Absolute: 0.2 10*3/uL (ref 0.0–0.7)
Eosinophils Relative: 2.6 % (ref 0.0–5.0)
HCT: 39.4 % (ref 36.0–46.0)
Hemoglobin: 12.9 g/dL (ref 12.0–15.0)
Lymphocytes Relative: 49.4 % — ABNORMAL HIGH (ref 12.0–46.0)
Lymphs Abs: 2.8 10*3/uL (ref 0.7–4.0)
MCHC: 32.6 g/dL (ref 30.0–36.0)
MCV: 87.2 fl (ref 78.0–100.0)
Monocytes Absolute: 0.3 10*3/uL (ref 0.1–1.0)
Monocytes Relative: 5.4 % (ref 3.0–12.0)
Neutro Abs: 2.4 10*3/uL (ref 1.4–7.7)
Neutrophils Relative %: 42.2 % — ABNORMAL LOW (ref 43.0–77.0)
Platelets: 253 10*3/uL (ref 150.0–400.0)
RBC: 4.52 Mil/uL (ref 3.87–5.11)
RDW: 14.1 % (ref 11.5–15.5)
WBC: 5.7 10*3/uL (ref 4.0–10.5)

## 2021-04-02 LAB — HIGH SENSITIVITY CRP: CRP, High Sensitivity: 2.95 mg/L (ref 0.000–5.000)

## 2021-04-02 LAB — COMPREHENSIVE METABOLIC PANEL
ALT: 36 U/L — ABNORMAL HIGH (ref 0–35)
AST: 23 U/L (ref 0–37)
Albumin: 4.5 g/dL (ref 3.5–5.2)
Alkaline Phosphatase: 57 U/L (ref 39–117)
BUN: 9 mg/dL (ref 6–23)
CO2: 28 mEq/L (ref 19–32)
Calcium: 9.7 mg/dL (ref 8.4–10.5)
Chloride: 105 mEq/L (ref 96–112)
Creatinine, Ser: 0.74 mg/dL (ref 0.40–1.20)
GFR: 106.18 mL/min (ref >60.00)
Glucose, Bld: 87 mg/dL (ref 70–99)
Potassium: 3.9 mEq/L (ref 3.5–5.1)
Sodium: 139 mEq/L (ref 135–145)
Total Bilirubin: 0.6 mg/dL (ref 0.2–1.2)
Total Protein: 7.8 g/dL (ref 6.0–8.3)

## 2021-04-05 LAB — TISSUE TRANSGLUTAMINASE, IGA: (tTG) Ab, IgA: <1 U/mL

## 2021-04-05 LAB — IGA: Immunoglobulin A: 263 mg/dL (ref 47–310)

## 2021-04-13 LAB — INFLIXIMAB+AB (SERIAL MONITOR)
Anti-Infliximab Antibody: 187 ng/mL
Infliximab Drug Level: 14 ug/mL

## 2021-04-13 LAB — SERIAL MONITORING

## 2021-04-29 ENCOUNTER — Telehealth: Payer: Self-pay

## 2021-04-29 NOTE — Telephone Encounter (Signed)
Pyrtle Pt is scheduled for her infusion of infliximab tomorrow at [facility] infusion. She wanted to let us know she had been on antibiotics March 20th-30th for ezema. She thinks it was keflex. Pt was on azithromycin back in October and Dr. Bryan Lemma has stated it was ok for her to receive the infliximab. Pt wants to make sure it is ok for her to receive the infusion tomorrow. As DOD please advise. ?

## 2021-04-29 NOTE — Telephone Encounter (Signed)
Order stating ok for tx faxed to [facility] infusion. ?

## 2021-05-03 NOTE — Progress Notes (Signed)
? ?Subjective:  ? ? Patient ID: Carla Clark, female    DOB: 13-Nov-1985, 36 y.o.   MRN: [id number] ? ?Chief Complaint  ?Patient presents with  ? Annual Exam  ? ? ?HPI ?Patient is in today for her annual physical exam. She is doing well overall. She is following closely with gastroenterology and her Crohn's Disease is well managed. No recent febrile illness or hospitalizations. Denies CP/palp/SOB/HA/congestion/fevers/GI or GU c/o. Taking meds as prescribed she stays active and maintains a heart healthy diet.  ? ?Past Medical History:  ?Diagnosis Date  ? Anal fissure   ? Anemia 11/2011  ? iron deficiency. 01/2013 parenteral iron. Intranasal B12.  Dr Marin Olp follows.   ? B12 deficiency 11/2012  ? Breast mass in female 04/21/2012  ? Cervical cancer screening 01/27/2012  ? Chicken pox as a child  ? Crohn's disease ([place]) 2013  ? ileitis 10/2011. ileitis, mesenteric abscess, ? entero-enteric fistula on 08/2012 CT enterography  ? Dermatitis 08/22/2016  ? GERD (gastroesophageal reflux disease)   ? Hepatitis B immune 07/2012  ? previous vaccination.   ? Hives 09/2014  ? per Dermatologist.  Rx Zyrtec, Benadryl.   ? Ileitis   ? Serrated polyp of colon   ? Small bowel obstruction ([place])   ? SVD (spontaneous vaginal delivery) 08/13/2018  ? Vitamin D deficiency 2014  ? ? ?Past Surgical History:  ?Procedure Laterality Date  ? LAPAROSCOPIC RIGHT HEMI COLECTOMY  07/11/2019  ? WISDOM TOOTH EXTRACTION  36 yrs old  ? ? ?Family History  ?Problem Relation Age of Onset  ? Nephrolithiasis Father   ? Brain cancer Father   ? Other Father   ?     anaplastic astrocytoma  ? Ovarian cancer Paternal Aunt   ?     ovarian  ? Breast cancer Paternal Aunt   ? Cancer Paternal Aunt   ?     breast (2019) and ovarian cancer (2005)  ? Other Paternal Uncle 57  ?     cyst removed from pancreas  ? Pancreatic cancer Paternal Uncle   ?     Stage 1  ? Cancer Paternal Uncle   ?     pancreatic  ? Lung cancer Maternal Grandmother   ? Gout Maternal Grandfather   ? Heart  attack Maternal Grandfather   ? Ovarian cancer Paternal Grandmother   ? Cancer Paternal Grandmother 10  ?     ovarian  ? Pancreatic cancer Paternal Grandfather   ? Colon cancer Neg Hx   ? Stomach cancer Neg Hx   ? Esophageal cancer Neg Hx   ? ? ?Social History  ? ?Socioeconomic History  ? Marital status: Married  ?  Spouse name: Not on file  ? Number of children: 2  ? Years of education: Not on file  ? Highest education level: Not on file  ?Occupational History  ? Occupation: Pharmacist, hospital  ?  Employer: [employer]  ?Tobacco Use  ? Smoking status: Never  ? Smokeless tobacco: Never  ? Tobacco comments:  ?  never used tobacco  ?Vaping Use  ? Vaping Use: Never used  ?Substance and Sexual Activity  ? Alcohol use: Not Currently  ?  Alcohol/week: 0.0 standard drinks  ?  Comment: occasionaly  ? Drug use: No  ? Sexual activity: Yes  ?  Partners: Male  ?  Birth control/protection: None  ?Other Topics Concern  ? Not on file  ?Social History Narrative  ? Not on file  ? ?Social Determinants  of Health  ? ?Financial Resource Strain: Not on file  ?Food Insecurity: Not on file  ?Transportation Needs: Not on file  ?Physical Activity: Not on file  ?Stress: Not on file  ?Social Connections: Not on file  ?Intimate Partner Violence: Not on file  ? ? ?Outpatient Medications Prior to Visit  ?Medication Sig Dispense Refill  ? inFLIXimab (REMICADE) 100 MG injection Inject into the vein every 8 (eight) weeks.    ? levonorgestrel (MIRENA, 52 MG,) 20 MCG/DAY IUD Mirena 20 mcg/24 hours (8 yrs) 52 mg intrauterine device ? Take by intrauterine route.    ? vitamin B-12 (CYANOCOBALAMIN) 100 MCG tablet Take 100 mcg by mouth daily.    ? azaTHIOprine (IMURAN) 50 MG tablet Take 1 tablet (50 mg total) by mouth daily. (Patient not taking: Reported on 03/30/2021) 30 tablet 5  ? neomycin-polymyxin-hydrocortisone (CORTISPORIN) 3.5-10000-1 OTIC suspension Place 3 drops into the right ear 3 (three) times daily. 10 mL 0  ? ?No facility-administered  medications prior to visit.  ? ? ?No Known Allergies ? ?Review of Systems  ?Constitutional:  Negative for chills, fever and malaise/fatigue.  ?HENT:  Negative for congestion and hearing loss.   ?Eyes:  Negative for blurred vision and discharge.  ?Respiratory:  Negative for cough, sputum production and shortness of breath.   ?Cardiovascular:  Negative for chest pain, palpitations and leg swelling.  ?Gastrointestinal:  Negative for abdominal pain, blood in stool, constipation, diarrhea, heartburn, nausea and vomiting.  ?Genitourinary:  Negative for dysuria, frequency, hematuria and urgency.  ?Musculoskeletal:  Negative for back pain, falls and myalgias.  ?Skin:  Negative for rash.  ?Neurological:  Negative for dizziness, sensory change, loss of consciousness, weakness and headaches.  ?Endo/Heme/Allergies:  Negative for environmental allergies. Does not bruise/bleed easily.  ?Psychiatric/Behavioral:  Positive for depression. Negative for suicidal ideas. The patient is not nervous/anxious and does not have insomnia.   ? ?   ?Objective:  ?  ?Physical Exam ?Constitutional:   ?   General: She is not in acute distress. ?   Appearance: She is well-developed.  ?HENT:  ?   Head: Normocephalic and atraumatic.  ?Eyes:  ?   Conjunctiva/sclera: Conjunctivae normal.  ?Neck:  ?   Thyroid: No thyromegaly.  ?Cardiovascular:  ?   Rate and Rhythm: Normal rate and regular rhythm.  ?   Heart sounds: Normal heart sounds. No murmur heard. ?Pulmonary:  ?   Effort: Pulmonary effort is normal. No respiratory distress.  ?   Breath sounds: Normal breath sounds.  ?Abdominal:  ?   General: Bowel sounds are normal. There is no distension.  ?   Palpations: Abdomen is soft. There is no mass.  ?   Tenderness: There is no abdominal tenderness.  ?Musculoskeletal:  ?   Cervical back: Neck supple.  ?Lymphadenopathy:  ?   Cervical: No cervical adenopathy.  ?Skin: ?   General: Skin is warm and dry.  ?Neurological:  ?   Mental Status: She is alert and  oriented to person, place, and time.  ?Psychiatric:     ?   Behavior: Behavior normal.  ? ? ?BP 130/70 (BP Location: Right Arm, Patient Position: Sitting, Cuff Size: Normal)   Pulse 79   Resp 20   Ht 5' 3"  (1.6 m)   Wt 141 lb (64 kg)   SpO2 99%   BMI 24.98 kg/m?  ?Wt Readings from Last 3 Encounters:  ?05/04/21 141 lb (64 kg)  ?03/30/21 141 lb (64 kg)  ?01/12/21 146 lb 12.8 oz (  66.6 kg)  ? ? ?Diabetic Foot Exam - Simple   ?No data filed ?  ? ?Lab Results  ?Component Value Date  ? WBC 5.7 04/02/2021  ? HGB 12.9 04/02/2021  ? HCT 39.4 04/02/2021  ? PLT 253.0 04/02/2021  ? GLUCOSE 87 04/02/2021  ? CHOL 133 05/04/2021  ? TRIG 139.0 05/04/2021  ? HDL 53.70 05/04/2021  ? [place] 52 05/04/2021  ? ALT 36 (H) 04/02/2021  ? AST 23 04/02/2021  ? NA 139 04/02/2021  ? K 3.9 04/02/2021  ? CL 105 04/02/2021  ? CREATININE 0.74 04/02/2021  ? BUN 9 04/02/2021  ? CO2 28 04/02/2021  ? TSH 1.40 05/04/2021  ? HGBA1C 5.2 09/16/2019  ? ? ?Lab Results  ?Component Value Date  ? TSH 1.40 05/04/2021  ? ?Lab Results  ?Component Value Date  ? WBC 5.7 04/02/2021  ? HGB 12.9 04/02/2021  ? HCT 39.4 04/02/2021  ? MCV 87.2 04/02/2021  ? PLT 253.0 04/02/2021  ? ?Lab Results  ?Component Value Date  ? NA 139 04/02/2021  ? K 3.9 04/02/2021  ? CO2 28 04/02/2021  ? GLUCOSE 87 04/02/2021  ? BUN 9 04/02/2021  ? CREATININE 0.74 04/02/2021  ? BILITOT 0.6 04/02/2021  ? ALKPHOS 57 04/02/2021  ? AST 23 04/02/2021  ? ALT 36 (H) 04/02/2021  ? PROT 7.8 04/02/2021  ? ALBUMIN 4.5 04/02/2021  ? CALCIUM 9.7 04/02/2021  ? ANIONGAP 7 04/18/2015  ? GFR 106.18 04/02/2021  ? ?Lab Results  ?Component Value Date  ? CHOL 133 05/04/2021  ? ?Lab Results  ?Component Value Date  ? HDL 53.70 05/04/2021  ? ?Lab Results  ?Component Value Date  ? [place] 52 05/04/2021  ? ?Lab Results  ?Component Value Date  ? TRIG 139.0 05/04/2021  ? ?Lab Results  ?Component Value Date  ? CHOLHDL 2 05/04/2021  ? ?Lab Results  ?Component Value Date  ? HGBA1C 5.2 09/16/2019  ? ? ?   ?Assessment &  Plan:  ? ?Problem List Items Addressed This Visit   ? ? Preventative health care - Primary  ?  Patient encouraged to maintain heart healthy diet, regular exercise, adequate sleep. Consider daily probiotics. Take medication

## 2021-05-04 ENCOUNTER — Encounter: Payer: Self-pay | Admitting: Family Medicine

## 2021-05-04 ENCOUNTER — Ambulatory Visit (INDEPENDENT_AMBULATORY_CARE_PROVIDER_SITE_OTHER): Payer: Commercial Managed Care - PPO | Admitting: Family Medicine

## 2021-05-04 VITALS — BP 130/70 | HR 79 | Resp 20 | Ht 63.0 in | Wt 141.0 lb

## 2021-05-04 DIAGNOSIS — E538 Deficiency of other specified B group vitamins: Secondary | ICD-10-CM

## 2021-05-04 DIAGNOSIS — Z0000 Encounter for general adult medical examination without abnormal findings: Secondary | ICD-10-CM

## 2021-05-04 DIAGNOSIS — R4584 Anhedonia: Secondary | ICD-10-CM | POA: Diagnosis not present

## 2021-05-04 NOTE — Assessment & Plan Note (Signed)
Supplement and monitor 

## 2021-05-04 NOTE — Patient Instructions (Signed)
Preventive Care 61-34 Years Old, Female ?Preventive care refers to lifestyle choices and visits with your health care provider that can promote health and wellness. Preventive care visits are also called wellness exams. ?What can I expect for my preventive care visit? ?Counseling ?During your preventive care visit, your health care provider may ask about your: ?Medical history, including: ?Past medical problems. ?Family medical history. ?Pregnancy history. ?Current health, including: ?Menstrual cycle. ?Method of birth control. ?Emotional well-being. ?Home life and relationship well-being. ?Sexual activity and sexual health. ?Lifestyle, including: ?Alcohol, nicotine or tobacco, and drug use. ?Access to firearms. ?Diet, exercise, and sleep habits. ?Work and work Statistician. ?Sunscreen use. ?Safety issues such as seatbelt and bike helmet use. ?Physical exam ?Your health care provider may check your: ?Height and weight. These may be used to calculate your BMI (body mass index). BMI is a measurement that tells if you are at a healthy weight. ?Waist circumference. This measures the distance around your waistline. This measurement also tells if you are at a healthy weight and may help predict your risk of certain diseases, such as type 2 diabetes and high blood pressure. ?Heart rate and blood pressure. ?Body temperature. ?Skin for abnormal spots. ?What immunizations do I need? ?Vaccines are usually given at various ages, according to a schedule. Your health care provider will recommend vaccines for you based on your age, medical history, and lifestyle or other factors, such as travel or where you work. ?What tests do I need? ?Screening ?Your health care provider may recommend screening tests for certain conditions. This may include: ?Pelvic exam and Pap test. ?Lipid and cholesterol levels. ?Diabetes screening. This is done by checking your blood sugar (glucose) after you have not eaten for a while (fasting). ?Hepatitis B  test. ?Hepatitis C test. ?HIV (human immunodeficiency virus) test. ?STI (sexually transmitted infection) testing, if you are at risk. ?BRCA-related cancer screening. This may be done if you have a family history of breast, ovarian, tubal, or peritoneal cancers. ?Talk with your health care provider about your test results, treatment options, and if necessary, the need for more tests. ?Follow these instructions at home: ?Eating and drinking ? ?Eat a healthy diet that includes fresh fruits and vegetables, whole grains, lean protein, and low-fat dairy products. ?Take vitamin and mineral supplements as recommended by your health care provider. ?Do not drink alcohol if: ?Your health care provider tells you not to drink. ?You are pregnant, may be pregnant, or are planning to become pregnant. ?If you drink alcohol: ?Limit how much you have to 0-1 drink a day. ?Know how much alcohol is in your drink. In the [place] one drink equals one 12 oz bottle of beer (355 mL), one 5 oz glass of wine (148 mL), or one 1? oz glass of hard liquor (44 mL). ?Lifestyle ?Brush your teeth every morning and night with fluoride toothpaste. Floss one time each day. ?Exercise for at least 30 minutes 5 or more days each week. ?Do not use any products that contain nicotine or tobacco. These products include cigarettes, chewing tobacco, and vaping devices, such as e-cigarettes. If you need help quitting, ask your health care provider. ?Do not use drugs. ?If you are sexually active, practice safe sex. Use a condom or other form of protection to prevent STIs. ?If you do not wish to become pregnant, use a form of birth control. If you plan to become pregnant, see your health care provider for a prepregnancy visit. ?Find healthy ways to manage stress, such as: ?Meditation, yoga,  or listening to music. ?Journaling. ?Talking to a trusted person. ?Spending time with friends and family. ?Minimize exposure to UV radiation to reduce your risk of skin  cancer. ?Safety ?Always wear your seat belt while driving or riding in a vehicle. ?Do not drive: ?If you have been drinking alcohol. Do not ride with someone who has been drinking. ?If you have been using any mind-altering substances or drugs. ?While texting. ?When you are tired or distracted. ?Wear a helmet and other protective equipment during sports activities. ?If you have firearms in your house, make sure you follow all gun safety procedures. ?Seek help if you have been physically or sexually abused. ?What's next? ?Go to your health care provider once a year for an annual wellness visit. ?Ask your health care provider how often you should have your eyes and teeth checked. ?Stay up to date on all vaccines. ?This information is not intended to replace advice given to you by your health care provider. Make sure you discuss any questions you have with your health care provider. ?Document Revised: 07/08/2020 Document Reviewed: 07/08/2020 ?Elsevier Patient Education ? [place]. ? ?

## 2021-05-04 NOTE — Assessment & Plan Note (Addendum)
Patient encouraged to maintain heart healthy diet, regular exercise, adequate sleep. Consider daily probiotics. Take medications as prescribed. Labs reviewed and ordered. Is following with OB/GYN for paps ?

## 2021-05-05 DIAGNOSIS — R4584 Anhedonia: Secondary | ICD-10-CM | POA: Insufficient documentation

## 2021-05-05 LAB — LIPID PANEL
Cholesterol: 133 mg/dL (ref 0–200)
HDL: 53.7 mg/dL (ref >39.00)
LDL Cholesterol: 52 mg/dL (ref 0–99)
NonHDL: 79.46
Total CHOL/HDL Ratio: 2
Triglycerides: 139 mg/dL (ref 0.0–149.0)
VLDL: 27.8 mg/dL (ref 0.0–40.0)

## 2021-05-05 LAB — VITAMIN B12: Vitamin B-12: 279 pg/mL (ref 211–911)

## 2021-05-05 LAB — TSH: TSH: 1.4 u[IU]/mL (ref 0.35–5.50)

## 2021-05-05 NOTE — Assessment & Plan Note (Signed)
She is referred to behavioral health for counseling. She declines medications at this time but will notify us if she changes her mind. Her dad died of a brain tumor at age 34 in December, she works and has 2 small children so is overwhelmed at times.  ?

## 2021-05-14 ENCOUNTER — Inpatient Hospital Stay: Payer: Commercial Managed Care - PPO | Attending: Hematology & Oncology

## 2021-05-14 ENCOUNTER — Inpatient Hospital Stay (HOSPITAL_BASED_OUTPATIENT_CLINIC_OR_DEPARTMENT_OTHER): Payer: Commercial Managed Care - PPO | Admitting: Family

## 2021-05-14 ENCOUNTER — Encounter: Payer: Self-pay | Admitting: Family

## 2021-05-14 VITALS — BP 99/71 | HR 80 | Temp 98.3°F | Resp 17 | Wt 143.0 lb

## 2021-05-14 DIAGNOSIS — K5090 Crohn's disease, unspecified, without complications: Secondary | ICD-10-CM | POA: Diagnosis not present

## 2021-05-14 DIAGNOSIS — D500 Iron deficiency anemia secondary to blood loss (chronic): Secondary | ICD-10-CM

## 2021-05-14 DIAGNOSIS — K909 Intestinal malabsorption, unspecified: Secondary | ICD-10-CM | POA: Diagnosis not present

## 2021-05-14 DIAGNOSIS — K922 Gastrointestinal hemorrhage, unspecified: Secondary | ICD-10-CM | POA: Diagnosis present

## 2021-05-14 LAB — CBC WITH DIFFERENTIAL (CANCER CENTER ONLY)
Abs Immature Granulocytes: 0.07 10*3/uL (ref 0.00–0.07)
Basophils Absolute: 0 10*3/uL (ref 0.0–0.1)
Basophils Relative: 0 %
Eosinophils Absolute: 0.2 10*3/uL (ref 0.0–0.5)
Eosinophils Relative: 3 %
HCT: 36.4 % (ref 36.0–46.0)
Hemoglobin: 11.7 g/dL — ABNORMAL LOW (ref 12.0–15.0)
Immature Granulocytes: 1 %
Lymphocytes Relative: 43 %
Lymphs Abs: 2.9 10*3/uL (ref 0.7–4.0)
MCH: 28.6 pg (ref 26.0–34.0)
MCHC: 32.1 g/dL (ref 30.0–36.0)
MCV: 89 fL (ref 80.0–100.0)
Monocytes Absolute: 0.5 10*3/uL (ref 0.1–1.0)
Monocytes Relative: 7 %
Neutro Abs: 3.2 10*3/uL (ref 1.7–7.7)
Neutrophils Relative %: 46 %
Platelet Count: 230 10*3/uL (ref 150–400)
RBC: 4.09 MIL/uL (ref 3.87–5.11)
RDW: 13.1 % (ref 11.5–15.5)
WBC Count: 6.8 10*3/uL (ref 4.0–10.5)
nRBC: 0 % (ref 0.0–0.2)

## 2021-05-14 LAB — RETICULOCYTES
Immature Retic Fract: 4.8 % (ref 2.3–15.9)
RBC.: 4.1 MIL/uL (ref 3.87–5.11)
Retic Count, Absolute: 28.7 10*3/uL (ref 19.0–186.0)
Retic Ct Pct: 0.7 % (ref 0.4–3.1)

## 2021-05-14 LAB — FERRITIN: Ferritin: 118 ng/mL (ref 11–307)

## 2021-05-14 NOTE — Progress Notes (Signed)
?Hematology and Oncology Follow Up Visit ? ?Carla Clark ?[id number] ?06/30/1985 36 y.o. ?05/14/2021 ? ? ?Principle Diagnosis:  ?Iron deficiency anemia secondary to chronic blood loss due to Crohn's disease ?  ?Current Therapy:        ?IV iron as indicated  ?  ?Interim History:  [name] is here today for follow-up. She is here today for follow-up. She is doing well.  ?She has had some eczema on her ears and face which she states is a side effect from her Remicade for UC. She states that she is picking up an antibiotic for this later today.  ?Cycle has been regular with light flow. No other blood loss noted.  ?No bruising or petechiae.  ?No fever, chills, n/v, cough, dizziness, SOB, chest pain, palpitations, abdominal pain or changes in bowel or bladder habits.  ?No swelling, tenderness, numbness or tingling in her extremities.  ?No falls or syncope.  ?She is eating well and staying hydrated throughout the day.  ? ?ECOG Performance Status: 1 - Symptomatic but completely ambulatory ? ?Medications:  ?Allergies as of 05/14/2021   ?No Known Allergies ?  ? ?  ?Medication List  ?  ? ?  ? Accurate as of May 14, 2021  2:47 PM. If you have any questions, ask your nurse or doctor.  ?  ?  ? ?  ? ?inFLIXimab 100 MG injection ?Commonly known as: REMICADE ?Inject into the vein every 8 (eight) weeks. ?  ?Mirena (52 MG) 20 MCG/DAY Iud ?Generic drug: levonorgestrel ?Mirena 20 mcg/24 hours (8 yrs) 52 mg intrauterine device ? Take by intrauterine route. ?  ?vitamin B-12 100 MCG tablet ?Commonly known as: CYANOCOBALAMIN ?Take 100 mcg by mouth daily. ?  ? ?  ? ? ?Allergies: No Known Allergies ? ?Past Medical History, Surgical history, Social history, and Family History were reviewed and updated. ? ?Review of Systems: ?All other 10 point review of systems is negative.  ? ?Physical Exam: ? weight is 143 lb (64.9 kg). Her oral temperature is 98.3 ?F (36.8 ?C). Her blood pressure is 99/71 and her pulse is 80. Her respiration is 17 and  oxygen saturation is 99%.  ? ?Wt Readings from Last 3 Encounters:  ?05/14/21 143 lb (64.9 kg)  ?05/04/21 141 lb (64 kg)  ?03/30/21 141 lb (64 kg)  ? ? ?Ocular: Sclerae unicteric, pupils equal, round and reactive to light ?Ear-nose-throat: Oropharynx clear, dentition fair ?Lymphatic: No cervical or supraclavicular adenopathy ?Lungs no rales or rhonchi, good excursion bilaterally ?Heart regular rate and rhythm, no murmur appreciated ?Abd soft, nontender, positive bowel sounds ?MSK no focal spinal tenderness, no joint edema ?Neuro: non-focal, well-oriented, appropriate affect ?Breasts: Deferred  ? ?Lab Results  ?Component Value Date  ? WBC 6.8 05/14/2021  ? HGB 11.7 (L) 05/14/2021  ? HCT 36.4 05/14/2021  ? MCV 89.0 05/14/2021  ? PLT 230 05/14/2021  ? ?Lab Results  ?Component Value Date  ? FERRITIN 176 01/12/2021  ? IRON 108 01/12/2021  ? TIBC 304 01/12/2021  ? UIBC 196 01/12/2021  ? IRONPCTSAT 36 (H) 01/12/2021  ? ?Lab Results  ?Component Value Date  ? RETICCTPCT 0.7 05/14/2021  ? RBC 4.10 05/14/2021  ? RETICCTABS 13.7 (L) 01/20/2015  ? ?No results found for: KPAFRELGTCHN, LAMBDASER, KAPLAMBRATIO ?Lab Results  ?Component Value Date  ? IGA 187 03/11/2016  ? ?No results found for: TOTALPROTELP, ALBUMINELP, A1GS, A2GS, BETS, BETA2SER, GAMS, MSPIKE, SPEI ?  Chemistry   ?   ?Component Value Date/Time  ? NA  139 04/02/2021 1345  ? K 3.9 04/02/2021 1345  ? CL 105 04/02/2021 1345  ? CO2 28 04/02/2021 1345  ? BUN 9 04/02/2021 1345  ? CREATININE 0.74 04/02/2021 1345  ? CREATININE 0.62 08/20/2012 1139  ?    ?Component Value Date/Time  ? CALCIUM 9.7 04/02/2021 1345  ? ALKPHOS 57 04/02/2021 1345  ? AST 23 04/02/2021 1345  ? ALT 36 (H) 04/02/2021 1345  ? BILITOT 0.6 04/02/2021 1345  ?  ? ? ? ?Impression and Plan: [name] is a very pleasant 36 yo caucasian female with history of iron deficiency anemia secondary to 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  ?Iron studies are pending.  ?Follow-up in 4  months.  ? ?Lottie Dawson, NP ?4/21/20232:47 PM  ?

## 2021-05-17 LAB — IRON AND IRON BINDING CAPACITY (CC-WL,HP ONLY)
Iron: 51 ug/dL (ref 28–170)
Saturation Ratios: 18 % (ref 10.4–31.8)
TIBC: 277 ug/dL (ref 250–450)
UIBC: 226 ug/dL (ref 148–442)

## 2021-06-07 ENCOUNTER — Ambulatory Visit: Payer: Commercial Managed Care - PPO | Admitting: Internal Medicine

## 2021-06-30 ENCOUNTER — Ambulatory Visit (INDEPENDENT_AMBULATORY_CARE_PROVIDER_SITE_OTHER): Payer: Commercial Managed Care - PPO | Admitting: Psychologist

## 2021-06-30 DIAGNOSIS — F320 Major depressive disorder, single episode, mild: Secondary | ICD-10-CM | POA: Diagnosis not present

## 2021-06-30 DIAGNOSIS — Z634 Disappearance and death of family member: Secondary | ICD-10-CM

## 2021-06-30 NOTE — Progress Notes (Addendum)
[facility] Counselor Initial Adult Exam  Name: Carla Clark Date: 06/30/2021 MRN: [id number] DOB: 10/27/1985 PCP: Mosie Lukes, MD  Time spent: 1:04 pm to 1:33 pm; total time: 29 minutes  This session was held via video webex teletherapy due to the coronavirus risk at this time. The patient consented to video teletherapy and was located at her home during this session. She is aware it is the responsibility of the patient to secure confidentiality on her end of the session. The provider was in a private home office for the duration of this session. Limits of confidentiality were discussed with the patient.   Guardian/Payee:  NA    Paperwork requested: No   Reason for Visit /Presenting Problem: Depression and grief  Mental Status Exam: Appearance:   Well Groomed     Behavior:  Appropriate  Motor:  Normal  Speech/Language:   Clear and Coherent  Affect:  Appropriate  Mood:  normal  Thought process:  normal  Thought content:    WNL  Sensory/Perceptual disturbances:    WNL  Orientation:  oriented to person, place, and time/date  Attention:  Good  Concentration:  Good  Memory:  WNL  Fund of knowledge:   Good  Insight:    Fair  Judgment:   Good  Impulse Control:  Good    Reported Symptoms:  The patient endorsed experiencing the following: feeling down, sad, rumination of negative thoughts, irritability, lack of motivation, procrastination, social isolation, and avoiding pleasurable activities. She denied suicidal and homicidal ideation.   Risk Assessment: Danger to Self:  No Self-injurious Behavior: No Danger to Others: No Duty to Warn:no Physical Aggression / Violence:No  Access to Firearms a concern: No  Gang Involvement:No  Patient / guardian was educated about steps to take if suicide or homicide risk level increases between visits: n/a While future psychiatric events cannot be accurately predicted, the patient does not currently require acute inpatient  psychiatric care and does not currently meet [facility] involuntary commitment criteria.  Substance Abuse History: Current substance abuse: No     Past Psychiatric History:   No previous psychological problems have been observed Outpatient Providers:NA History of Psych Hospitalization: No  Psychological Testing:  NA    Abuse History:  Victim of: No.,  NA    Report needed: No. Victim of Neglect:No. Perpetrator of  NA   Witness / Exposure to Domestic Violence: No   Protective Services Involvement: No  Witness to [employer] Violence:  No   Family History:  Family History  Problem Relation Age of Onset   Nephrolithiasis Father    Brain cancer Father    Other Father        anaplastic astrocytoma   Ovarian cancer Paternal Aunt        ovarian   Breast cancer Paternal Aunt    Cancer Paternal Aunt        breast (2019) and ovarian cancer (2005)   Other Paternal Uncle 58       cyst removed from pancreas   Pancreatic cancer Paternal Uncle        Stage 1   Cancer Paternal Uncle        pancreatic   Lung cancer Maternal Grandmother    Gout Maternal Grandfather    Heart attack Maternal Grandfather    Ovarian cancer Paternal Grandmother    Cancer Paternal Grandmother 83       ovarian   Pancreatic cancer Paternal Grandfather    Colon cancer Neg Hx  Stomach cancer Neg Hx    Esophageal cancer Neg Hx     Living situation: the patient lives with their family  Sexual Orientation: Straight  Relationship Status: married  Name of spouse / other:David If a parent, number of children / ages:Patient has two children  Support Systems: Patient stated that her husband, younger sister, friends and coworkers make up her support system.   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   Military Service: No   Educational History: Education: college graduate  Religion/Sprituality/World View: Christian  Any cultural differences  that may affect / interfere with treatment:  not applicable   Recreation/Hobbies: Singing  Stressors: Other: Being a parent and adjusting to parenting    Strengths: Supportive Relationships  Barriers:  NA   Legal History: Pending legal issue / charges: The patient has no significant history of legal issues. History of legal issue / charges:  NA  Medical History/Surgical History: reviewed Past Medical History:  Diagnosis Date   Anal fissure    Anemia 11/2011   iron deficiency. 01/2013 parenteral iron. Intranasal B12.  Dr Marin Olp follows.    B12 deficiency 11/2012   Breast mass in female 04/21/2012   Cervical cancer screening 01/27/2012   Chicken pox as a child   Crohn's disease ([place]) 2013   ileitis 10/2011. ileitis, mesenteric abscess, ? entero-enteric fistula on 08/2012 CT enterography   Dermatitis 08/22/2016   GERD (gastroesophageal reflux disease)    Hepatitis B immune 07/2012   previous vaccination.    Hives 09/2014   per Dermatologist.  Rx Zyrtec, Benadryl.    Ileitis    Serrated polyp of colon    Small bowel obstruction ([facility])    SVD (spontaneous vaginal delivery) 08/13/2018   Vitamin D deficiency 2014    Past Surgical History:  Procedure Laterality Date   LAPAROSCOPIC RIGHT HEMI COLECTOMY  07/11/2019   WISDOM TOOTH EXTRACTION  36 yrs old    Medications: Current Outpatient Medications  Medication Sig Dispense Refill   inFLIXimab (REMICADE) 100 MG injection Inject into the vein every 8 (eight) weeks.     levonorgestrel (MIRENA, 52 MG,) 20 MCG/DAY IUD Mirena 20 mcg/24 hours (8 yrs) 52 mg intrauterine device  Take by intrauterine route.     vitamin B-12 (CYANOCOBALAMIN) 100 MCG tablet Take 100 mcg by mouth daily.     No current facility-administered medications for this visit.    No Known Allergies  Diagnoses:  F32.0 major depressive affective disorder, single episode, mild and H74.1 uncomplicated bereavement  Plan of Care: The patient is a 36 year old  Caucasian female who was referred due to experiencing depression. The patient lives at home with her husband, two children, and a dog. The patient meets criteria for a diagnosis of F32.0 major depressive affective disorder, single episode, mild based off of the following: feeling down, sad, rumination of negative thoughts, irritability, lack of motivation, procrastination, social isolation, and avoiding pleasurable activities. She denied suicidal and homicidal ideation. The patient meets criteria for a diagnosis of S23.9 uncomplicated bereavement.   The patient stated that she wanted to better understand her emotions so that she could express her emotions. She also wants to develop coping skills as a parent. She wants to process grief she is experiencing.   This psychologist makes the recommendation that the patient participate on at least monthly basis to assist her in meeting her goals.   Conception Chancy, PsyD

## 2021-06-30 NOTE — Progress Notes (Signed)
                Theressa Piedra, PsyD 

## 2021-06-30 NOTE — Plan of Care (Signed)

## 2021-07-23 ENCOUNTER — Ambulatory Visit (INDEPENDENT_AMBULATORY_CARE_PROVIDER_SITE_OTHER): Payer: Commercial Managed Care - PPO | Admitting: Psychologist

## 2021-07-23 DIAGNOSIS — Z634 Disappearance and death of family member: Secondary | ICD-10-CM

## 2021-07-23 DIAGNOSIS — F320 Major depressive disorder, single episode, mild: Secondary | ICD-10-CM | POA: Diagnosis not present

## 2021-07-23 NOTE — Progress Notes (Signed)
                Statia Burdick, PsyD 

## 2021-07-23 NOTE — Progress Notes (Signed)
[facility] Counselor/Therapist Progress Note  Patient ID: Carla Clark, MRN: [id number],    Date: 07/23/2021  Time Spent: 2:04 pm to 2:47 pm; total time: 43 minutes   This session was held via video webex teletherapy due to the coronavirus risk at this time. The patient consented to video teletherapy and was located at her home during this session. She is aware it is the responsibility of the patient to secure confidentiality on her end of the session. The provider was in a private home office for the duration of this session. Limits of confidentiality were discussed with the patient.   Treatment Type: Individual Therapy  Reported Symptoms: Stressors  Mental Status Exam: Appearance:  Well Groomed     Behavior: Appropriate  Motor: Normal  Speech/Language:  Clear and Coherent  Affect: Appropriate  Mood: normal  Thought process: normal  Thought content:   WNL  Sensory/Perceptual disturbances:   WNL  Orientation: oriented to person, place, and time/date  Attention: Good  Concentration: Good  Memory: WNL  Fund of knowledge:  Good  Insight:   Good  Judgment:  Good  Impulse Control: Good   Risk Assessment: Danger to Self:  No Self-injurious Behavior: No Danger to Others: No Duty to Warn:no Physical Aggression / Violence:No  Access to Firearms a concern: No  Gang Involvement:No   Subjective: Beginning the session, patient described herself as doing okay while talking about upcoming events. After reviewing the treatment plan, patient indicated that she wanted to work on finding a balance between being present as a parent, and working as well. She participate in mindfulness describing it as helpful. From there, she explored and processed ways to implement mindfulness into her schedule. She also explored how to put her phone away. From there, she talked about supporting her husband who is experiencing panic attacks. She processed different ways to process how the  different parts of her life. She processed thoughts and emotions. She was agreeable to homework and following up. She denied suicidal and homicidal ideation.    Interventions:  Worked on developing a therapeutic relationship with the patient using active listening and reflective statements. Provided emotional support using empathy and validation. Reviewed the treatment plan with the patient. Reflected on upcoming events in the patient's life. Normalized and validated expressed thoughts and emotions. Identified goals for the session. Provided psychoeducation about mindfulness. Practiced and processed mindfulness. Praised patient for experiencing less distress. Explored ways that patient can implement mindfulness into her day to day life. Assisted in problem solving. Explored who could hold patient accountable for not looking at her phone. Validated expressed thoughts and emotions. Used socratic questions to assist the patient gain insight into self. Normalized patient's experience with different roles in life. Provided psychoeducation about a podcast episode, which may be helpful for the patient. Processed thoughts and emotions. Discussed how patient could practice mindfulness with spouse. Assigned homework. Assessed for suicidal and homicidal ideation.   Homework: Implement mindfulness and listen to podcast episode  Next Session: Emotional support and review homework.   Diagnosis: F32.0 major depressive affective disorder, single episode, mild and M01.0 uncomplicated bereavement.   Plan:  Goals Alleviate depressive symptoms Recognize, accept, and cope with depressive feelings Develop healthy thinking patterns Develop healthy interpersonal relationships Begin a healthy grieving process  Objectives target date for all objectives is 07/01/2022 Cooperate with a medication evaluation by a physician Verbalize an accurate understanding of depression Verbalize an understanding of the treatment Identify  and replace thoughts that support depression  Learn and implement behavioral strategies Verbalize an understanding and resolution of current interpersonal problems Learn and implement problem solving and decision making skills Learn and implement conflict resolution skills to resolve interpersonal problems Verbalize an understanding of healthy and unhealthy emotions verbalize insight into how past relationships may be influence current experiences with depression Use mindfulness and acceptance strategies and increase value based behavior  Increase hopeful statements about the future.  Tell in detail the story of the current loss that is triggering symptoms Read books on the topic of grief Watch videos on the theme of grief Begin verbalizing feelings associated with the loss Attend a grief support group express thoughts and feelings about the deceased Identify and voice positives about the deceased implement acts of spiritual faith  Interventions Consistent with treatment model, discuss how change in cognitive, behavioral, and interpersonal can help client alleviate depression CBT Behavioral activation help the client explore the relationship, nature of the dispute,  Help the client develop new interpersonal skills and relationships Conduct Problem so living therapy Teach conflict resolution skills Use a process-experiential approach Conduct TLDP Conduct ACT Evaluate need for psychotropic medication Monitor adherence to medication  create a safe environment and actively build trust use empathy, compassion, and support ask the patient to write a letter to the lost person conduct empty chair ask the patient to discuss and list the positives and negative aspects of the person encourage patient to rely upon his/her spiritual faith  ask client to read books on grief ask patient to watch videos about grief assist patient in identifying emotions  ask patient to attend support group    The patient and clinician reviewed the treatment plan on 07/23/2021. The patient approved of the treatment plan.     Conception Chancy, PsyD

## 2021-08-18 ENCOUNTER — Ambulatory Visit (INDEPENDENT_AMBULATORY_CARE_PROVIDER_SITE_OTHER): Payer: Commercial Managed Care - PPO | Admitting: Psychologist

## 2021-08-18 DIAGNOSIS — F320 Major depressive disorder, single episode, mild: Secondary | ICD-10-CM

## 2021-08-18 DIAGNOSIS — Z634 Disappearance and death of family member: Secondary | ICD-10-CM | POA: Diagnosis not present

## 2021-08-18 NOTE — Progress Notes (Signed)
[facility] Counselor/Therapist Progress Note  Patient ID: Carla Clark, MRN: [id number],    Date: 08/18/2021  Time Spent: 2:00 pm to 2:43 pm; total time: 43 minutes   This session was held via video webex teletherapy due to the coronavirus risk at this time. The patient consented to video teletherapy and was located at her home during this session. She is aware it is the responsibility of the patient to secure confidentiality on her end of the session. The provider was in a private home office for the duration of this session. Limits of confidentiality were discussed with the patient.   Treatment Type: Individual Therapy  Reported Symptoms: Experiencing less distress from stressors  Mental Status Exam: Appearance:  Well Groomed     Behavior: Appropriate  Motor: Normal  Speech/Language:  Clear and Coherent  Affect: Appropriate  Mood: normal  Thought process: normal  Thought content:   WNL  Sensory/Perceptual disturbances:   WNL  Orientation: oriented to person, place, and time/date  Attention: Good  Concentration: Good  Memory: WNL  Fund of knowledge:  Good  Insight:   Good  Judgment:  Good  Impulse Control: Good   Risk Assessment: Danger to Self:  No Self-injurious Behavior: No Danger to Others: No Duty to Warn:no Physical Aggression / Violence:No  Access to Firearms a concern: No  Gang Involvement:No   Subjective: Beginning the session, patient described herself as doing well indicating that the book and the podcast that were recommended to her have really helped. She reflected on how they have assisted her. From there, she talked about how she is in a season of reflecting on establishing boundaries with work and home life. Several different themes were discussed including the use of social media and establishing boundaries around work. She reflected on reasons that led to difficulties in establishing boundaries at work. She processed thoughts and emotions.  She voiced interest in exploring values. She was agreeable to homework and following up. She denied suicidal and homicidal ideation.    Interventions:  Worked on developing a therapeutic relationship with the patient using active listening and reflective statements. Provided emotional support using empathy and validation. Reviewed the treatment plan with the patient. Used summary statements. Praised the patient for doing better. Explored what has assisted the patient. Processed the growth that patient has experienced in a short time period. Reflected on what has assisted the patient. Identified goals for the session. Normalized and validated emotions. Used socratic questions to assist the patient gain insight into self. Identified the theme of social media and normalized the use of it. Processed how patient could establish boundaries at work. Explored the etiology that was present for difficult in having boundaries. Validated emotions. Used reflective statements. Used metaphors. Processed the idea of using values as next steps in counseling. Provided empathic statements. Assigned homework. Assessed for suicidal and homicidal ideation.   Homework: Review handout on values  Next Session: Emotional support and review homework.   Diagnosis: F32.0 major depressive affective disorder, single episode, mild and D14.9 uncomplicated bereavement.   Plan:  Goals Alleviate depressive symptoms Recognize, accept, and cope with depressive feelings Develop healthy thinking patterns Develop healthy interpersonal relationships Begin a healthy grieving process  Objectives target date for all objectives is 07/01/2022 Cooperate with a medication evaluation by a physician Verbalize an accurate understanding of depression Verbalize an understanding of the treatment Identify and replace thoughts that support depression Learn and implement behavioral strategies Verbalize an understanding and resolution of current  interpersonal  problems Learn and implement problem solving and decision making skills Learn and implement conflict resolution skills to resolve interpersonal problems Verbalize an understanding of healthy and unhealthy emotions verbalize insight into how past relationships may be influence current experiences with depression Use mindfulness and acceptance strategies and increase value based behavior  Increase hopeful statements about the future.  Tell in detail the story of the current loss that is triggering symptoms Read books on the topic of grief Watch videos on the theme of grief Begin verbalizing feelings associated with the loss Attend a grief support group express thoughts and feelings about the deceased Identify and voice positives about the deceased implement acts of spiritual faith  Interventions Consistent with treatment model, discuss how change in cognitive, behavioral, and interpersonal can help client alleviate depression CBT Behavioral activation help the client explore the relationship, nature of the dispute,  Help the client develop new interpersonal skills and relationships Conduct Problem so living therapy Teach conflict resolution skills Use a process-experiential approach Conduct TLDP Conduct ACT Evaluate need for psychotropic medication Monitor adherence to medication  create a safe environment and actively build trust use empathy, compassion, and support ask the patient to write a letter to the lost person conduct empty chair ask the patient to discuss and list the positives and negative aspects of the person encourage patient to rely upon his/her spiritual faith  ask client to read books on grief ask patient to watch videos about grief assist patient in identifying emotions  ask patient to attend support group   The patient and clinician reviewed the treatment plan on 07/23/2021. The patient approved of the treatment plan.     Conception Chancy,  PsyD

## 2021-08-24 ENCOUNTER — Inpatient Hospital Stay (HOSPITAL_BASED_OUTPATIENT_CLINIC_OR_DEPARTMENT_OTHER): Payer: Commercial Managed Care - PPO | Admitting: Family

## 2021-08-24 ENCOUNTER — Inpatient Hospital Stay: Payer: Commercial Managed Care - PPO | Attending: Family

## 2021-08-24 ENCOUNTER — Encounter: Payer: Self-pay | Admitting: Family

## 2021-08-24 VITALS — BP 114/75 | HR 67 | Temp 98.3°F | Resp 17 | Wt 140.8 lb

## 2021-08-24 DIAGNOSIS — D500 Iron deficiency anemia secondary to blood loss (chronic): Secondary | ICD-10-CM

## 2021-08-24 DIAGNOSIS — K922 Gastrointestinal hemorrhage, unspecified: Secondary | ICD-10-CM | POA: Diagnosis present

## 2021-08-24 DIAGNOSIS — K909 Intestinal malabsorption, unspecified: Secondary | ICD-10-CM | POA: Insufficient documentation

## 2021-08-24 DIAGNOSIS — K5090 Crohn's disease, unspecified, without complications: Secondary | ICD-10-CM | POA: Diagnosis not present

## 2021-08-24 LAB — CBC WITH DIFFERENTIAL (CANCER CENTER ONLY)
Abs Immature Granulocytes: 0.04 10*3/uL (ref 0.00–0.07)
Basophils Absolute: 0 10*3/uL (ref 0.0–0.1)
Basophils Relative: 0 %
Eosinophils Absolute: 0.2 10*3/uL (ref 0.0–0.5)
Eosinophils Relative: 3 %
HCT: 38.2 % (ref 36.0–46.0)
Hemoglobin: 12.2 g/dL (ref 12.0–15.0)
Immature Granulocytes: 1 %
Lymphocytes Relative: 46 %
Lymphs Abs: 2.6 10*3/uL (ref 0.7–4.0)
MCH: 28.6 pg (ref 26.0–34.0)
MCHC: 31.9 g/dL (ref 30.0–36.0)
MCV: 89.7 fL (ref 80.0–100.0)
Monocytes Absolute: 0.4 10*3/uL (ref 0.1–1.0)
Monocytes Relative: 8 %
Neutro Abs: 2.4 10*3/uL (ref 1.7–7.7)
Neutrophils Relative %: 42 %
Platelet Count: 223 10*3/uL (ref 150–400)
RBC: 4.26 MIL/uL (ref 3.87–5.11)
RDW: 13 % (ref 11.5–15.5)
WBC Count: 5.7 10*3/uL (ref 4.0–10.5)
nRBC: 0 % (ref 0.0–0.2)

## 2021-08-24 LAB — RETICULOCYTES
Immature Retic Fract: 2.1 % — ABNORMAL LOW (ref 2.3–15.9)
RBC.: 4.27 MIL/uL (ref 3.87–5.11)
Retic Count, Absolute: 30.7 10*3/uL (ref 19.0–186.0)
Retic Ct Pct: 0.7 % (ref 0.4–3.1)

## 2021-08-24 LAB — FERRITIN: Ferritin: 126 ng/mL (ref 11–307)

## 2021-08-24 NOTE — Progress Notes (Signed)
Hematology and Oncology Follow Up Visit  Carla Clark [id number] 08/10/1985 35 y.o. 08/24/2021   Principle Diagnosis:  Iron deficiency anemia secondary to chronic blood loss due to Crohn's disease   Current Therapy:        IV iron as indicated    Interim History:  Carla Clark is here today for follow-up. She is doing well and getting ready to start a new school year.  No recently Crohn's flare. No blood loss noted. She is tolerating Remicade nicely.  No bruising or petechiae.  She has had issues with eczema and is using neosporin.  No fever, chills, n/v, cough, rash, dizziness, SOB, chest pain, palpitations, abdominal pain or changes in bowel or bladder habits.  No swelling, numbness or tingling in her extremities.  She has intermittent generalized aches and pains.  No syncope reported.  Appetite and hydration have been good. Her weight is stable at 140 lbs.   ECOG Performance Status: 1 - Symptomatic but completely ambulatory  Medications:  Allergies as of 08/24/2021   No Known Allergies      Medication List        Accurate as of August 24, 2021  1:32 PM. If you have any questions, ask your nurse or doctor.          cyanocobalamin 100 MCG tablet Commonly known as: VITAMIN B12 Take 100 mcg by mouth daily.   inFLIXimab 100 MG injection Commonly known as: REMICADE Inject into the vein every 8 (eight) weeks.   Mirena (52 MG) 20 MCG/DAY Iud Generic drug: levonorgestrel Mirena 20 mcg/24 hours (8 yrs) 52 mg intrauterine device  Take by intrauterine route.        Allergies: No Known Allergies  Past Medical History, Surgical history, Social history, and Family History were reviewed and updated.  Review of Systems: All other 10 point review of systems is negative.   Physical Exam:  vitals were not taken for this visit.   Wt Readings from Last 3 Encounters:  05/14/21 143 lb (64.9 kg)  05/04/21 141 lb (64 kg)  03/30/21 141 lb (64 kg)    Ocular: Sclerae  unicteric, pupils equal, round and reactive to light Ear-nose-throat: Oropharynx clear, dentition fair Lymphatic: No cervical or supraclavicular adenopathy Lungs no rales or rhonchi, good excursion bilaterally Heart regular rate and rhythm, no murmur appreciated Abd soft, nontender, positive bowel sounds MSK no focal spinal tenderness, no joint edema Neuro: non-focal, well-oriented, appropriate affect Breasts: Deferred   Lab Results  Component Value Date   WBC 5.7 08/24/2021   HGB 12.2 08/24/2021   HCT 38.2 08/24/2021   MCV 89.7 08/24/2021   PLT 223 08/24/2021   Lab Results  Component Value Date   FERRITIN 118 05/14/2021   IRON 51 05/14/2021   TIBC 277 05/14/2021   UIBC 226 05/14/2021   IRONPCTSAT 18 05/14/2021   Lab Results  Component Value Date   RETICCTPCT 0.7 08/24/2021   RBC 4.27 08/24/2021   RETICCTABS 13.7 (L) 01/20/2015   No results found for: "KPAFRELGTCHN", "LAMBDASER", "KAPLAMBRATIO" Lab Results  Component Value Date   IGA 187 03/11/2016   No results found for: "TOTALPROTELP", "ALBUMINELP", "A1GS", "A2GS", "BETS", "BETA2SER", "GAMS", "MSPIKE", "SPEI"   Chemistry      Component Value Date/Time   NA 139 04/02/2021 1345   K 3.9 04/02/2021 1345   CL 105 04/02/2021 1345   CO2 28 04/02/2021 1345   BUN 9 04/02/2021 1345   CREATININE 0.74 04/02/2021 1345   CREATININE 0.62 08/20/2012 1139  Component Value Date/Time   CALCIUM 9.7 04/02/2021 1345   ALKPHOS 57 04/02/2021 1345   AST 23 04/02/2021 1345   ALT 36 (H) 04/02/2021 1345   BILITOT 0.6 04/02/2021 1345       Impression and Plan: Carla Clark is a very pleasant 36 yo caucasian female with history of iron deficiency anemia secondary to 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  Iron studies are pending.  Follow-up in 4 months.   Lottie Dawson, NP 8/1/20231:32 PM

## 2021-08-25 LAB — IRON AND IRON BINDING CAPACITY (CC-WL,HP ONLY)
Iron: 89 ug/dL (ref 28–170)
Saturation Ratios: 33 % — ABNORMAL HIGH (ref 10.4–31.8)
TIBC: 267 ug/dL (ref 250–450)
UIBC: 178 ug/dL (ref 148–442)

## 2021-09-30 ENCOUNTER — Ambulatory Visit (INDEPENDENT_AMBULATORY_CARE_PROVIDER_SITE_OTHER): Payer: Commercial Managed Care - PPO | Admitting: Psychologist

## 2021-09-30 DIAGNOSIS — Z634 Disappearance and death of family member: Secondary | ICD-10-CM | POA: Diagnosis not present

## 2021-09-30 DIAGNOSIS — F320 Major depressive disorder, single episode, mild: Secondary | ICD-10-CM

## 2021-09-30 NOTE — Progress Notes (Signed)
[facility] Counselor/Therapist Progress Note  Patient ID: Carla Clark, MRN: [id number],    Date: 09/30/2021  Time Spent: 03:00 pm to 03:38 pm; total time: 38 minutes   This session was held via video webex teletherapy due to the coronavirus risk at this time. The patient consented to video teletherapy and was located at her home during this session. She is aware it is the responsibility of the patient to secure confidentiality on her end of the session. The provider was in a private home office for the duration of this session. Limits of confidentiality were discussed with the patient.   Treatment Type: Individual Therapy  Reported Symptoms: Experiencing less distress  Mental Status Exam: Appearance:  Well Groomed     Behavior: Appropriate  Motor: Normal  Speech/Language:  Clear and Coherent  Affect: Appropriate  Mood: normal  Thought process: normal  Thought content:   WNL  Sensory/Perceptual disturbances:   WNL  Orientation: oriented to person, place, and time/date  Attention: Good  Concentration: Good  Memory: WNL  Fund of knowledge:  Good  Insight:   Good  Judgment:  Good  Impulse Control: Good   Risk Assessment: Danger to Self:  No Self-injurious Behavior: No Danger to Others: No Duty to Warn:no Physical Aggression / Violence:No  Access to Firearms a concern: No  Gang Involvement:No   Subjective: Beginning the session, patient described herself as doing well while reflecting on events since the last session. Continuing to talk, she voiced the beginning of the school year has gone well. From there, she stated that she wanted to process her grief related to the death of her father Mikki Santee) who died from a brain tumor. Patient spent the session processing grief reflected to her father. She was agreeable to homework and following up. She denied suicidal and homicidal ideation.    Interventions:  Worked on developing a therapeutic relationship with the  patient using active listening and reflective statements. Provided emotional support using empathy and validation. Reviewed the treatment plan with the patient. Used summary statements. Reviewed events since the last session. Praised the patient for doing well and explored what has assisted the patient. Normalized and validated thoughts. Identified goals for the session. Assisted the patient in doing a life review of her father. Processed thoughts and emotions. Explored ways that she could honor her father including creating a picture book and doing self-less acts for others. Reflected on the idea of writing a letter to her father expressing what she has learned from him through his death. Processed thoughts and emotions. Provided psychoeducation about [employer] and [employer]. Identified the theme of moving forward and explored what this meant to the patient. Provided empathic statements. Assigned homework. Assessed for suicidal and homicidal ideation.   Homework: Scientist, physiological and write letter to father regarding what she has learned from him through his death and how she has grown as a result of his death  Next Session: Emotional support and review homework.   Diagnosis: F32.0 major depressive affective disorder, single episode, mild and W09.8 uncomplicated bereavement.   Plan:  Goals Alleviate depressive symptoms Recognize, accept, and cope with depressive feelings Develop healthy thinking patterns Develop healthy interpersonal relationships Begin a healthy grieving process  Objectives target date for all objectives is 07/01/2022 Cooperate with a medication evaluation by a physician Verbalize an accurate understanding of depression Verbalize an understanding of the treatment Identify and replace thoughts that support depression Learn and implement behavioral strategies Verbalize  an understanding and resolution of current interpersonal problems Learn and implement problem solving and decision  making skills Learn and implement conflict resolution skills to resolve interpersonal problems Verbalize an understanding of healthy and unhealthy emotions verbalize insight into how past relationships may be influence current experiences with depression Use mindfulness and acceptance strategies and increase value based behavior  Increase hopeful statements about the future.  Tell in detail the story of the current loss that is triggering symptoms Read books on the topic of grief Watch videos on the theme of grief Begin verbalizing feelings associated with the loss Attend a grief support group express thoughts and feelings about the deceased Identify and voice positives about the deceased implement acts of spiritual faith  Interventions Consistent with treatment model, discuss how change in cognitive, behavioral, and interpersonal can help client alleviate depression CBT Behavioral activation help the client explore the relationship, nature of the dispute,  Help the client develop new interpersonal skills and relationships Conduct Problem so living therapy Teach conflict resolution skills Use a process-experiential approach Conduct TLDP Conduct ACT Evaluate need for psychotropic medication Monitor adherence to medication  create a safe environment and actively build trust use empathy, compassion, and support ask the patient to write a letter to the lost person conduct empty chair ask the patient to discuss and list the positives and negative aspects of the person encourage patient to rely upon his/her spiritual faith  ask client to read books on grief ask patient to watch videos about grief assist patient in identifying emotions  ask patient to attend support group   The patient and clinician reviewed the treatment plan on 07/23/2021. The patient approved of the treatment plan.     Conception Chancy, PsyD

## 2021-11-03 ENCOUNTER — Ambulatory Visit: Payer: Commercial Managed Care - PPO | Admitting: Psychologist

## 2021-11-10 ENCOUNTER — Telehealth: Payer: Self-pay | Admitting: Pharmacy Technician

## 2021-11-10 ENCOUNTER — Encounter: Payer: Self-pay | Admitting: Internal Medicine

## 2021-11-10 ENCOUNTER — Telehealth: Payer: Commercial Managed Care - PPO | Admitting: Internal Medicine

## 2021-11-10 VITALS — BP 110/68 | HR 70 | Ht 63.0 in | Wt 136.8 lb

## 2021-11-10 DIAGNOSIS — K50019 Crohn's disease of small intestine with unspecified complications: Secondary | ICD-10-CM | POA: Diagnosis not present

## 2021-11-10 DIAGNOSIS — L290 Pruritus ani: Secondary | ICD-10-CM

## 2021-11-10 DIAGNOSIS — E538 Deficiency of other specified B group vitamins: Secondary | ICD-10-CM

## 2021-11-10 DIAGNOSIS — L309 Dermatitis, unspecified: Secondary | ICD-10-CM

## 2021-11-10 DIAGNOSIS — D500 Iron deficiency anemia secondary to blood loss (chronic): Secondary | ICD-10-CM

## 2021-11-10 MED ORDER — HYDROCORTISONE (PERIANAL) 2.5 % EX CREA: 1.0000 | TOPICAL_CREAM | Freq: Two times a day (BID) | CUTANEOUS | 1 refills | Status: DC

## 2021-11-10 NOTE — Progress Notes (Signed)
Subjective:    Patient ID: Carla Clark, female    DOB: 19-Jun-1985, 36 y.o.   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  She is here alone today and I last saw her in March 2023.  She was seen by Dr. Wilhemina Bonito with dermatology after seeing me and he feels like her skin issues are predominantly eczema.  She did use some topical steroids for some time but now is doing moisturizing cream and Neosporin.  She still has ongoing issues with eczema on her forehead, bilateral ear canals, bilateral nares.  Her hands have always been involved even before starting infliximab though the other areas started afterward.  Dr. Ronnald Ramp reportedly did not feel this was related to infliximab.  Most recently she has had some perineal and perianal discomfort predominantly rough skin and itching.  She has seen some mild/minor bleeding with wiping after bowel movement.  Mostly no abdominal pain though she does report episode of mild discomfort after eating broccoli and cauliflower on back-to-back days.  No diarrhea.  No upper GI or hepatobiliary complaint.  Her last infliximab dose was in September  She has remained off azathioprine   Review of Systems As per HPI, otherwise negative  Current Medications, Allergies, Past Medical History, Past Surgical History, Family History and Social History were reviewed in [employer] record.    Objective:   Physical Exam BP 110/68   Pulse 70   Ht 5' 3"  (1.6 m)   Wt 136 lb 12.8 oz (62.1 kg)   SpO2 98%   BMI 24.23 kg/m  Gen: awake, alert, NAD HEENT:  anicteric,  CV: RRR, no mrg Pulm: CTA b/l Abd: soft, NT/ND, +BS throughout Rectal: External exam only, no hemorrhoids, perianal rash or erythema, no fistulae seen, mild anal excoriation, fissuring of the anal skin. Ext: no c/c/e Neuro: nonfocal     Latest Ref Rng & Units 08/24/2021    1:15 PM 05/14/2021    2:06 PM 04/02/2021    1:45 PM  CBC  WBC 4.0 - 10.5 K/uL 5.7  6.8  5.7   Hemoglobin 12.0 - 15.0 g/dL 12.2  11.7  12.9   Hematocrit 36.0 - 46.0 % 38.2  36.4  39.4   Platelets 150 - 400 K/uL 223  230  253.0    Iron/TIBC/Ferritin/ %Sat    Component Value Date/Time   IRON 89 08/24/2021 1315   IRON 70 01/11/2017 1514   TIBC 267 08/24/2021 1315   TIBC 299 01/11/2017 1514   FERRITIN 126 08/24/2021 1316   FERRITIN 25 01/11/2017 1514   IRONPCTSAT 33 (H) 08/24/2021 1315   IRONPCTSAT 23 01/11/2017 1514   CMP     Component Value Date/Time   NA 139 04/02/2021 1345   K 3.9 04/02/2021 1345   CL 105 04/02/2021 1345   CO2 28 04/02/2021 1345   GLUCOSE 87 04/02/2021 1345   BUN 9 04/02/2021 1345   CREATININE 0.74 04/02/2021 1345   CREATININE 0.62 08/20/2012 1139   CALCIUM 9.7 04/02/2021 1345   PROT 7.8 04/02/2021 1345   ALBUMIN 4.5 04/02/2021 1345   AST 23 04/02/2021 1345   ALT  36 (H) 04/02/2021 1345   ALKPHOS 57 04/02/2021 1345   BILITOT 0.6 04/02/2021 1345   GFRNONAA >60 04/18/2015 0433   GFRAA >60 04/18/2015 0433        Assessment & Plan:  36 year old female with a history of fistulizing and stricturing ileal Crohn's disease 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   Fistulizing and stricturing ileal Crohn's disease (s/p right hemicolectomy and enterocolonic fistula repair June 2021) on infliximab since  August 2021 --from a enteric standpoint she has done well with infliximab with no subjective ileal or colonic disease.  I am suspicious that there may be mild involvement of the perianal skin at this point based on exam today though fortunately I do not see fistulae.  I am concerned that infliximab is leading to worsening of her eczema and I have recommended that we change her to [name].  We discussed this medication today including the risks, benefits and alternatives and she is agreeable to this change -- Discontinue infliximab -- Begin Skyrizi 600 mg by IV infusion x3 every 4 weeks; then begin self injector pack 360 mg every 8 weeks -- CMP, QuantiFERON gold, hepatitis B surface antibody and core total today -- Fecal calprotectin to assess disease activity as we are stopping infliximab and beginning Skyrizi  2.  Perianal itch and excoriation --cannot exclude mild Crohn's activity in this location. -- Skyrizi as above -- Hydrocortisone ointment 2.5% twice daily for 7 to 10 days  3.  Eczema --significant and involving more than just her hands which was what she has dealt with for ever, now involving forehead, ear canal, nares.  While eczema can certainly be an independent condition, I am concerned that infliximab is driving this process -- Continue dermatology follow-up -- Discontinuing infliximab and beginning Dean  4.  B12 deficiency --she has resumed and should continue oral B12  5.  IDA following with hematology --previous IV iron, iron studies checked recently and normal.  She is no longer anemic.  40 minutes total spent today including patient facing time, coordination of care, reviewing medical history/procedures/pertinent radiology studies, and documentation of the encounter.  See me in 2-3 months

## 2021-11-10 NOTE — Patient Instructions (Signed)
_______________________________________________________  If you are age 34 or older, your body mass index should be between 23-30. Your Body mass index is 24.23 kg/m. If this is out of the aforementioned range listed, please consider follow up with your Primary Care Provider.  If you are age 16 or younger, your body mass index should be between 19-25. Your Body mass index is 24.23 kg/m. If this is out of the aformentioned range listed, please consider follow up with your Primary Care Provider.   ________________________________________________________  The [facility] providers would like to encourage you to use [facility] to communicate with providers for non-urgent requests or questions.  Due to long hold times on the telephone, sending your provider a message by [facility] may be a faster and more efficient way to get a response.  Please allow 48 business hours for a response.  Please remember that this is for non-urgent requests.  _______________________________________________________  Your provider has requested that you go to the basement level for lab work before leaving today. Press "B" on the elevator. The lab is located at the first door on the left as you exit the elevator.  Dr. Vena Rua nurse will reach out to you to start the [facility]

## 2021-11-10 NOTE — Telephone Encounter (Addendum)
Dr. Hilarie Fredrickson, Juluis Rainier note:  Auth Submission: APPROVED Payer: [employer] Medication & CPT/J Code(s) submitted: Orson Ape [employer]) [phone] Route of submission (phone, fax, portal): PORTAL Phone # Fax # Auth type: Buy/Bill Units/visits requested: X3 DOSES Reference number: [id number] Approval from:  11/10/21 - 02/10/22  Patient will be scheduled as soon as possible.  Skyrizi co-pay card: APPROVED ID: [id number] PCNGertie Exon: [id number] BIN: [id number] CARD ID: [id number] Exp: 10/26 Cvv 315  @[email or web address]  Please start auth for maintenance dosing.  Thanks Maudie Mercury

## 2021-11-15 NOTE — Progress Notes (Signed)
Remicade infusions cancelled at [facility] infusion center. Skyrizi should be scheduled at [facility] infusion center.

## 2021-11-18 ENCOUNTER — Ambulatory Visit (INDEPENDENT_AMBULATORY_CARE_PROVIDER_SITE_OTHER): Payer: Commercial Managed Care - PPO | Admitting: Psychologist

## 2021-11-18 DIAGNOSIS — F320 Major depressive disorder, single episode, mild: Secondary | ICD-10-CM | POA: Diagnosis not present

## 2021-11-18 DIAGNOSIS — Z634 Disappearance and death of family member: Secondary | ICD-10-CM

## 2021-11-18 NOTE — Progress Notes (Signed)
[facility] Counselor/Therapist Progress Note  Patient ID: Carla Clark, MRN: [id number],    Date: 11/18/2021  Time Spent: 12:02 pm to 12:42 pm; total time: 40 minutes   This session was held via video webex teletherapy due to the coronavirus risk at this time. The patient consented to video teletherapy and was located at her home during this session. She is aware it is the responsibility of the patient to secure confidentiality on her end of the session. The provider was in a private home office for the duration of this session. Limits of confidentiality were discussed with the patient.   Treatment Type: Individual Therapy  Reported Symptoms: Experiencing some distress with managing different responsibilities and self-care  Mental Status Exam: Appearance:  Well Groomed     Behavior: Appropriate  Motor: Normal  Speech/Language:  Clear and Coherent  Affect: Appropriate  Mood: normal  Thought process: normal  Thought content:   WNL  Sensory/Perceptual disturbances:   WNL  Orientation: oriented to person, place, and time/date  Attention: Good  Concentration: Good  Memory: WNL  Fund of knowledge:  Good  Insight:   Good  Judgment:  Good  Impulse Control: Good   Risk Assessment: Danger to Self:  No Self-injurious Behavior: No Danger to Others: No Duty to Warn:no Physical Aggression / Violence:No  Access to Firearms a concern: No  Gang Involvement:No   Subjective: Beginning the session, patient described herself as doing well while reviewing events since the last session. Patient has not completed her homework due to her current season of life. Patient spent the session working on finding a balance of being present for her family, work, and self. She processed different ways to do this. She processed thoughts regarding finding balances in life. She was agreeable to homework and following up. She denied suicidal and homicidal ideation.    Interventions:   Worked on developing a therapeutic relationship with the patient using active listening and reflective statements. Provided emotional support using empathy and validation. Reviewed the treatment plan with the patient. Used summary statements. Praised the patient for doing well and explored what has assisted the patient. Reflected on patient's current season in life. Identified goals for the session. Used socratic questions to assist the patient. Processed how patient can implement steps to be present for self, spouse, children, and work. Used metaphors to assist the patient gain insight into self. Explored different ways to implement being intentional with self and others. Discussed ways to implement self-care. Assisted in problem solving. Provided empathic statements. Assigned homework. Assessed for suicidal and homicidal ideation.   Homework: Implement self-care into work blocks and implement times of being intentional within current season of life with others around her  Next Session: Emotional support and review homework.   Diagnosis: F32.0 major depressive affective disorder, single episode, mild and M22.6 uncomplicated bereavement.   Plan:  Goals Alleviate depressive symptoms Recognize, accept, and cope with depressive feelings Develop healthy thinking patterns Develop healthy interpersonal relationships Begin a healthy grieving process  Objectives target date for all objectives is 07/01/2022 Cooperate with a medication evaluation by a physician Verbalize an accurate understanding of depression Verbalize an understanding of the treatment Identify and replace thoughts that support depression Learn and implement behavioral strategies Verbalize an understanding and resolution of current interpersonal problems Learn and implement problem solving and decision making skills Learn and implement conflict resolution skills to resolve interpersonal problems Verbalize an understanding of healthy  and unhealthy emotions verbalize insight into  how past relationships may be influence current experiences with depression Use mindfulness and acceptance strategies and increase value based behavior  Increase hopeful statements about the future.  Tell in detail the story of the current loss that is triggering symptoms Read books on the topic of grief Watch videos on the theme of grief Begin verbalizing feelings associated with the loss Attend a grief support group express thoughts and feelings about the deceased Identify and voice positives about the deceased implement acts of spiritual faith  Interventions Consistent with treatment model, discuss how change in cognitive, behavioral, and interpersonal can help client alleviate depression CBT Behavioral activation help the client explore the relationship, nature of the dispute,  Help the client develop new interpersonal skills and relationships Conduct Problem so living therapy Teach conflict resolution skills Use a process-experiential approach Conduct TLDP Conduct ACT Evaluate need for psychotropic medication Monitor adherence to medication  create a safe environment and actively build trust use empathy, compassion, and support ask the patient to write a letter to the lost person conduct empty chair ask the patient to discuss and list the positives and negative aspects of the person encourage patient to rely upon his/her spiritual faith  ask client to read books on grief ask patient to watch videos about grief assist patient in identifying emotions  ask patient to attend support group   The patient and clinician reviewed the treatment plan on 07/23/2021. The patient approved of the treatment plan.     Conception Chancy, PsyD

## 2021-11-19 ENCOUNTER — Encounter: Payer: Self-pay | Admitting: Family

## 2021-11-19 ENCOUNTER — Encounter: Payer: Self-pay | Admitting: Internal Medicine

## 2021-11-19 NOTE — Telephone Encounter (Signed)
Great!  Thanks [employer]

## 2021-12-06 ENCOUNTER — Ambulatory Visit (INDEPENDENT_AMBULATORY_CARE_PROVIDER_SITE_OTHER): Payer: Commercial Managed Care - PPO

## 2021-12-06 VITALS — BP 110/71 | HR 70 | Temp 98.9°F | Resp 16 | Ht 63.5 in | Wt 137.0 lb

## 2021-12-06 DIAGNOSIS — K50019 Crohn's disease of small intestine with unspecified complications: Secondary | ICD-10-CM | POA: Diagnosis not present

## 2021-12-06 MED ORDER — RISANKIZUMAB-RZAA 600 MG/10ML IV SOLN
600.0000 mg | Freq: Once | INTRAVENOUS | Status: AC
  Administered 2021-12-06: 600 mg via INTRAVENOUS
  Filled 2021-12-06: qty 10

## 2021-12-06 NOTE — Progress Notes (Signed)
Diagnosis: Crohn's Disease  Provider:  Marshell Garfinkel MD  Procedure: Infusion  IV Type: Peripheral, IV Location: L Antecubital  Skyrizi (risankizumab-rzaa), Dose: 600 mg  Infusion Start Time: 1350  Infusion Stop Time: 1500  Post Infusion IV Care: Observation period completed and Peripheral IV Discontinued  Discharge: Condition: Good, Destination: Home . AVS provided to patient.   Performed by:  Koren Shiver, RN

## 2021-12-08 NOTE — Telephone Encounter (Signed)
What is the maintenance dose for the [facility]?

## 2021-12-09 NOTE — Telephone Encounter (Signed)
See below and advise

## 2021-12-09 NOTE — Telephone Encounter (Signed)
360 mg is the maintenance [place] dosing

## 2021-12-22 ENCOUNTER — Encounter: Payer: Self-pay | Admitting: Internal Medicine

## 2021-12-22 ENCOUNTER — Ambulatory Visit (INDEPENDENT_AMBULATORY_CARE_PROVIDER_SITE_OTHER): Payer: Commercial Managed Care - PPO | Admitting: Psychologist

## 2021-12-22 ENCOUNTER — Encounter: Payer: Self-pay | Admitting: Family

## 2021-12-22 ENCOUNTER — Telehealth: Payer: Self-pay | Admitting: Pharmacy Technician

## 2021-12-22 ENCOUNTER — Other Ambulatory Visit (HOSPITAL_COMMUNITY): Payer: Self-pay

## 2021-12-22 DIAGNOSIS — Z634 Disappearance and death of family member: Secondary | ICD-10-CM

## 2021-12-22 DIAGNOSIS — F320 Major depressive disorder, single episode, mild: Secondary | ICD-10-CM

## 2021-12-22 NOTE — Telephone Encounter (Signed)
Patient Advocate Encounter  Received notification from [employer] that prior authorization for SKYRIZI 360MG is required.   PA submitted on 11.29.23 Key [id number] Status is pending

## 2021-12-22 NOTE — Addendum Note (Signed)
Addended by: Conception Chancy E on: 12/22/2021 02:56 PM   Modules accepted: Level of Service

## 2021-12-22 NOTE — Progress Notes (Signed)
Carla Clark  Patient ID: Carla Clark, MRN: [id number],    Date: 12/22/2021  Time Spent: 12:02 pm to 12:45 pm; total time: 43 minutes   This session was held via video webex teletherapy due to the coronavirus risk at this time. The patient consented to video teletherapy and was located at her home during this session. She is aware it is the responsibility of the patient to secure confidentiality on her end of the session. The provider was in a private home office for the duration of this session. Limits of confidentiality were discussed with the patient.   Treatment Type: Individual Therapy  Reported Symptoms: Experiencing some distress with managing self-care  Mental Status Exam: Appearance:  Well Groomed     Behavior: Appropriate  Motor: Normal  Speech/Language:  Clear and Coherent  Affect: Appropriate  Mood: normal  Thought process: normal  Thought content:   WNL  Sensory/Perceptual disturbances:   WNL  Orientation: oriented to person, place, and time/date  Attention: Good  Concentration: Good  Memory: WNL  Fund of knowledge:  Good  Insight:   Good  Judgment:  Good  Impulse Control: Good   Risk Assessment: Danger to Self:  No Self-injurious Behavior: No Danger to Others: No Duty to Warn:no Physical Aggression / Violence:No  Access to Firearms a concern: No  Gang Involvement:No   Subjective: Beginning the session, patient described herself as doing okay while reflecting on events since the last session. Patient indicated that she had a difficult time implementing self-care. She spent the session reflecting on different themes making it difficult to implement self-care. She reflected on not knowing what self-care looks like to her. She was agreeable to homework and following up. She denied suicidal and homicidal ideation.    Interventions:  Worked on developing a therapeutic relationship with the patient using active  listening and reflective statements. Provided emotional support using empathy and validation. Reviewed the treatment plan with the patient. Reviewed events since the last session. Normalized and validated expressed thoughts and emotions. Explored barriers to completing homework. Identified goals for the session. Identified different themes that make self-care challenging. Normalized thoughts. Used a Product/process development scientist to assist the patient gain insight into self. Processed what type of example patient wants to set for children.  Used socratic questions to assist the patient. Used self-disclosure to assist the patient. Explored and processed values and how values align with making choices. Validated and normalized that values will be in conflict with each other. Provided empathic statements. Assigned homework. Assessed for suicidal and homicidal ideation.   Homework: Review handout on self-care  Next Session: Emotional support and review homework.   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   Plan:  Goals Alleviate depressive symptoms Recognize, accept, and cope with depressive feelings Develop healthy thinking patterns Develop healthy interpersonal relationships Begin a healthy grieving process  Objectives target date for all objectives is 07/01/2022 Cooperate with a medication evaluation by a physician Verbalize an accurate understanding of depression Verbalize an understanding of the treatment Identify and replace thoughts that support depression Learn and implement behavioral strategies Verbalize an understanding and resolution of current interpersonal problems Learn and implement problem solving and decision making skills Learn and implement conflict resolution skills to resolve interpersonal problems Verbalize an understanding of healthy and unhealthy emotions verbalize insight into how past relationships may be influence current  experiences with depression Use mindfulness and acceptance strategies and increase value  based behavior  Increase hopeful statements about the future.  Tell in detail the story of the current loss that is triggering symptoms Read books on the topic of grief Watch videos on the theme of grief Begin verbalizing feelings associated with the loss Attend a grief support group express thoughts and feelings about the deceased Identify and voice positives about the deceased implement acts of spiritual faith  Interventions Consistent with treatment model, discuss how change in cognitive, behavioral, and interpersonal can help client alleviate depression CBT Behavioral activation help the client explore the relationship, nature of the dispute,  Help the client develop new interpersonal skills and relationships Conduct Problem so living therapy Teach conflict resolution skills Use a process-experiential approach Conduct TLDP Conduct ACT Evaluate need for psychotropic medication Monitor adherence to medication  create a safe environment and actively build trust use empathy, compassion, and support ask the patient to write a letter to the lost person conduct empty chair ask the patient to discuss and list the positives and negative aspects of the person encourage patient to rely upon his/her spiritual faith  ask client to read books on grief ask patient to watch videos about grief assist patient in identifying emotions  ask patient to attend support group   The patient and clinician reviewed the treatment plan on 07/23/2021. The patient approved of the treatment plan.     Conception Chancy, PsyD

## 2021-12-23 NOTE — Telephone Encounter (Signed)
Received a fax regarding Prior Authorization from [facility] for [place]. Authorization has been DENIED because ADDITIONAL DOCUMENTATION WAS NEEDED.  eAPPEAL HAS BEEN SUBMITTED. Please be advised appeals may take up to 5 business days to be submitted as pharmacist prepares necessary documentation. Thank you!

## 2021-12-27 ENCOUNTER — Encounter: Payer: Self-pay | Admitting: Internal Medicine

## 2021-12-27 ENCOUNTER — Encounter: Payer: Self-pay | Admitting: Family

## 2021-12-27 ENCOUNTER — Other Ambulatory Visit (HOSPITAL_COMMUNITY): Payer: Self-pay

## 2021-12-27 NOTE — Telephone Encounter (Addendum)
Patient Advocate Encounter  Prior Authorization for SKYRIZI 360MG has been approved.    PA# [id number] Effective dates: 12.2.23 through 6.2.24

## 2022-01-03 ENCOUNTER — Other Ambulatory Visit (HOSPITAL_COMMUNITY): Payer: Self-pay

## 2022-01-04 ENCOUNTER — Ambulatory Visit (INDEPENDENT_AMBULATORY_CARE_PROVIDER_SITE_OTHER): Payer: Commercial Managed Care - PPO

## 2022-01-04 VITALS — BP 115/72 | HR 65 | Temp 98.6°F | Resp 18 | Ht 63.0 in | Wt 136.2 lb

## 2022-01-04 DIAGNOSIS — K50019 Crohn's disease of small intestine with unspecified complications: Secondary | ICD-10-CM | POA: Diagnosis not present

## 2022-01-04 MED ORDER — RISANKIZUMAB-RZAA 600 MG/10ML IV SOLN
600.0000 mg | Freq: Once | INTRAVENOUS | Status: AC
  Administered 2022-01-04: 600 mg via INTRAVENOUS
  Filled 2022-01-04: qty 10

## 2022-01-04 NOTE — Progress Notes (Signed)
Diagnosis: Crohn's Disease  Provider:  Marshell Garfinkel MD  Procedure: Infusion  IV Type: Peripheral, IV Location: L Antecubital  Skyrizi (risankizumab-rzaa), Dose: 600 mg  Infusion Start Time: 6967  Infusion Stop Time: 8938  Post Infusion IV Care: Peripheral IV Discontinued  Discharge: Condition: Good, Destination: Home . AVS provided to patient.   Performed by:  Cleophus Molt, RN

## 2022-01-05 ENCOUNTER — Inpatient Hospital Stay: Payer: Commercial Managed Care - PPO

## 2022-01-05 ENCOUNTER — Inpatient Hospital Stay: Payer: Commercial Managed Care - PPO | Admitting: Family

## 2022-01-10 ENCOUNTER — Encounter: Payer: Self-pay | Admitting: *Deleted

## 2022-01-10 NOTE — Progress Notes (Signed)
Reached out to Carla Clark to introduce myself as the office RN Navigator and explain our new patient process. Reviewed the reason for their referral and scheduled their new patient appointment along with labs. Provided address and directions to the office including call back phone number. Reviewed with patient any concerns they may have or any possible barriers to attending their appointment.   Informed patient about my role as a navigator and that I will meet with them prior to their New Patient appointment and more fully discuss what services I can provide. At this time patient has no further questions or needs.    Oncology Nurse Navigator Documentation     01/10/2022    1:00 PM  Oncology Nurse Navigator Flowsheets  Abnormal Finding Date 12/31/2021  Confirmed Diagnosis Date 01/05/2022  Navigator Follow Up Date: 01/11/2022  Navigator Follow Up Reason: New Patient Appointment  Navigator Location [facility]  Navigator Encounter Type Introductory Phone Call  Patient Visit Type MedOnc  Treatment Phase Pre-Tx/Tx Discussion  Barriers/Navigation Needs Coordination of Care;Education  Education Other  Interventions Coordination of Care;Education  Acuity Level 2-Minimal Needs (1-2 Barriers Identified)  Coordination of Care Appts  Education Method Verbal  Support Groups/Services Friends and Family  Time Spent with Patient 30

## 2022-01-11 ENCOUNTER — Inpatient Hospital Stay: Payer: Commercial Managed Care - PPO | Attending: Family

## 2022-01-11 ENCOUNTER — Encounter: Payer: Self-pay | Admitting: *Deleted

## 2022-01-11 ENCOUNTER — Inpatient Hospital Stay: Payer: Commercial Managed Care - PPO | Admitting: Hematology & Oncology

## 2022-01-11 ENCOUNTER — Ambulatory Visit: Payer: Commercial Managed Care - PPO | Admitting: Psychologist

## 2022-01-11 ENCOUNTER — Other Ambulatory Visit: Payer: Self-pay

## 2022-01-11 VITALS — BP 122/75 | HR 94 | Temp 98.7°F | Resp 16 | Ht 63.0 in | Wt 132.0 lb

## 2022-01-11 DIAGNOSIS — C50912 Malignant neoplasm of unspecified site of left female breast: Secondary | ICD-10-CM | POA: Diagnosis not present

## 2022-01-11 DIAGNOSIS — N63 Unspecified lump in unspecified breast: Secondary | ICD-10-CM

## 2022-01-11 LAB — CBC WITH DIFFERENTIAL (CANCER CENTER ONLY)
Abs Immature Granulocytes: 0.07 10*3/uL (ref 0.00–0.07)
Basophils Absolute: 0 10*3/uL (ref 0.0–0.1)
Basophils Relative: 0 %
Eosinophils Absolute: 0.1 10*3/uL (ref 0.0–0.5)
Eosinophils Relative: 2 %
HCT: 43.1 % (ref 36.0–46.0)
Hemoglobin: 13.9 g/dL (ref 12.0–15.0)
Immature Granulocytes: 1 %
Lymphocytes Relative: 33 %
Lymphs Abs: 2.4 10*3/uL (ref 0.7–4.0)
MCH: 28.4 pg (ref 26.0–34.0)
MCHC: 32.3 g/dL (ref 30.0–36.0)
MCV: 88.1 fL (ref 80.0–100.0)
Monocytes Absolute: 0.4 10*3/uL (ref 0.1–1.0)
Monocytes Relative: 6 %
Neutro Abs: 4.3 10*3/uL (ref 1.7–7.7)
Neutrophils Relative %: 58 %
Platelet Count: 234 10*3/uL (ref 150–400)
RBC: 4.89 MIL/uL (ref 3.87–5.11)
RDW: 13 % (ref 11.5–15.5)
WBC Count: 7.3 10*3/uL (ref 4.0–10.5)
nRBC: 0 % (ref 0.0–0.2)

## 2022-01-11 LAB — CMP (CANCER CENTER ONLY)
ALT: 45 U/L — ABNORMAL HIGH (ref 0–44)
AST: 34 U/L (ref 15–41)
Albumin: 4.8 g/dL (ref 3.5–5.0)
Alkaline Phosphatase: 59 U/L (ref 38–126)
Anion gap: 8 (ref 5–15)
BUN: 11 mg/dL (ref 6–20)
CO2: 25 mmol/L (ref 22–32)
Calcium: 10.5 mg/dL — ABNORMAL HIGH (ref 8.9–10.3)
Chloride: 107 mmol/L (ref 98–111)
Creatinine: 0.8 mg/dL (ref 0.44–1.00)
GFR, Estimated: >60 mL/min (ref >60)
Glucose, Bld: 101 mg/dL — ABNORMAL HIGH (ref 70–99)
Potassium: 4 mmol/L (ref 3.5–5.1)
Sodium: 140 mmol/L (ref 135–145)
Total Bilirubin: 0.5 mg/dL (ref 0.3–1.2)
Total Protein: 8.7 g/dL — ABNORMAL HIGH (ref 6.5–8.1)

## 2022-01-11 LAB — IRON AND IRON BINDING CAPACITY (CC-WL,HP ONLY)
Iron: 53 ug/dL (ref 28–170)
Saturation Ratios: 17 % (ref 10.4–31.8)
TIBC: 312 ug/dL (ref 250–450)
UIBC: 259 ug/dL (ref 148–442)

## 2022-01-11 LAB — FERRITIN: Ferritin: 222 ng/mL (ref 11–307)

## 2022-01-11 LAB — LACTATE DEHYDROGENASE: LDH: 224 U/L — ABNORMAL HIGH (ref 98–192)

## 2022-01-11 NOTE — Progress Notes (Signed)
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  HPI: Carla Clark is well-known to [place].  She is incredibly charming 36 year old premenopausal white female.  She has been seen this for years because of iron deficiency anemia secondary to Crohn's disease.  She has been seen very diligently by my NP [place].  She has had Crohn's disease surgery I think back in 2021.  There is a history of breast cancer or ovarian cancer in the family.  There is a history of all BRCA1 in the family.  In November, she noticed a fullness in the left breast.  This is in the upper outer quadrant of the left breast.  She subsequently underwent a mammogram.  Mammogram was done on 12/31/2021.  Surprisingly, this showed a irregular solid mass measuring up to 8 cm in the upper outer quadrant of the left breast.  Also noted was a abnormal lymph node in the left axilla.  She then underwent a ultrasound.  This was also done on 12/31/2021.  This confirmed an 8 cm solid mass in the breast.  She then underwent a biopsy.  This was done on 01/05/2022.  The pathology report ([id number]) showed an invasive ductal carcinoma.  It was TRIPLE NEGATIVE.  A biopsy of a left axillary lymph node also was positive for malignancy.  She has seen an oncologist at [facility].  Lab work was done.  She show me the lab work.  She had a markedly elevated CA 27.29 of I think 1600.  Because she had seen this for many years, she came back to see [place] again.  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  He is to be a Pharmacist, hospital.  She has no bony pain.  There is no nausea or vomiting.  She has had no change in bowel or bladder habits.  She has an IUD and so she does not have regular cycles.  There is been no cough or shortness of breath.  She has had no headache.  She is set up for a MRI of the  breasts and CT scan of the body on Friday at [facility].  She has had no rashes.  There is been no bleeding.  She has had no leg swelling.  She has 2 children.  Her first child was born when she was 69 years old.  Again, there is a history of breast, ovarian, and pancreatic cancer in the family.  She does not smoke.  I really do not think she has much alcohol use.  She does have Crohn's.  She is on Skyrizi for the Crohn's.  She does see Dr. Hilarie Fredrickson of Gastroenterology.  Overall, I would say performance status is probably ECOG 0.    Past Medical History:  Diagnosis Date   Anal fissure    Anemia 11/2011   iron deficiency. 01/2013 parenteral iron. Intranasal B12.  Dr Marin Olp follows.    B12 deficiency 11/2012   Breast mass in female 04/21/2012   Cervical cancer screening 01/27/2012   Chicken pox as a child   Crohn's disease ([place]) 2013   ileitis 10/2011. ileitis, mesenteric abscess, ? entero-enteric fistula on 08/2012 CT enterography   Dermatitis 08/22/2016   GERD (gastroesophageal reflux disease)    Hepatitis B immune 07/2012   previous vaccination.    Hives 09/2014   per Dermatologist.  Rx Zyrtec, Benadryl.    Ileitis    Serrated polyp of colon    Small bowel obstruction (HCC)    SVD (spontaneous vaginal delivery) 08/13/2018   Vitamin D deficiency 2014  :   Past Surgical History:  Procedure Laterality Date   LAPAROSCOPIC RIGHT HEMI COLECTOMY  07/11/2019   WISDOM TOOTH EXTRACTION  36 yrs old  :   Current Outpatient Medications:    mometasone (ELOCON) 0.1 % lotion, 1 application to affected area Externally Twice a day for 14 days, Disp: , Rfl:    Risankizumab-rzaa (SKYRIZI [place]), Inject into the skin., Disp: , Rfl:    Shampoos (CLN MOISTURE RICH GENTLE) SHAM, as directed Externally, Disp: , Rfl:    Sulfacetamide Sodium-Sulfur 8-4 % SUSP, 1 application to affected area Externally Once a day, Disp: , Rfl:    hydrocortisone (ANUSOL-HC) 2.5 % rectal cream, Place 1 Application  rectally 2 (two) times daily., Disp: 28 g, Rfl: 1   levonorgestrel (MIRENA, 52 MG,) 20 MCG/DAY IUD, Mirena 20 mcg/24 hours (8 yrs) 52 mg intrauterine device  Take by intrauterine route., Disp: , Rfl:    vitamin B-12 (CYANOCOBALAMIN) 100 MCG tablet, Take 100 mcg by mouth daily., Disp: , Rfl: :  :  No Known Allergies:   Family History  Problem Relation Age of Onset   Nephrolithiasis Father    Brain cancer Father    Other Father        anaplastic astrocytoma   Ovarian cancer Paternal Aunt        ovarian   Breast cancer Paternal Aunt    Cancer Paternal Aunt        breast (2019) and ovarian cancer (2005)   Other Paternal Uncle 72       cyst removed from pancreas   Pancreatic cancer Paternal Uncle        Stage 1   Cancer Paternal Uncle        pancreatic   Lung cancer Maternal Grandmother    Gout Maternal Grandfather    Heart attack Maternal Grandfather    Ovarian cancer Paternal Grandmother    Cancer Paternal Grandmother 83       ovarian   Pancreatic cancer Paternal Grandfather    Colon cancer Neg Hx    Stomach cancer Neg Hx    Esophageal cancer Neg Hx   :   Social History   Socioeconomic History   Marital status: Married    Spouse name: Not on file   Number of children: 2   Years of education: Not on file   Highest education level: Not on file  Occupational History   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 [employer]  Tobacco Use   Smoking status: Never   Smokeless tobacco: Never   Tobacco comments:    never used tobacco  Vaping Use   Vaping Use: Never used  Substance and Sexual Activity   Alcohol use: Not Currently    Alcohol/week: 0.0 standard drinks of alcohol    Comment: occasionaly   Drug use: No   Sexual activity: Yes    Partners: Male    Birth control/protection: None  Other Topics Concern   Not on file  Social History Narrative   Not on file   Social Determinants of Health   Financial Resource Strain: Not on file  Food Insecurity: Not on  file  Transportation Needs: Not on file  Physical Activity: Not on file  Stress: Not on file  Social Connections: Not on file  Intimate Partner Violence: Not on file  :  Review of Systems  Constitutional: Negative.   HENT: Negative.    Eyes: Negative.   Respiratory: Negative.    Cardiovascular: Negative.   Gastrointestinal: Negative.   Genitourinary: Negative.   Musculoskeletal: Negative.   Skin: Negative.   Neurological: Negative.   Endo/Heme/Allergies: Negative.   Psychiatric/Behavioral: Negative.       Exam: Vital signs are temperature of 98.7.  Pulse 94.  Blood pressure 122/75.  Weight is 132 pounds.  _0 @ Physical Exam Vitals reviewed.  Constitutional:      Comments: Her breast exam shows right breast with no masses, edema or erythema.  There is no right axillary adenopathy.  Left leg does show a firmness in the upper outer quadrant of the left breast.  This is probably about 7 cm.  It is nontender.  It is not mobile.  She has a little bit of fullness in the left axilla.  There is no nipple discharge.  There is no erythema or actual swelling of the left breast.  HENT:     Head: Normocephalic and atraumatic.  Eyes:     Pupils: Pupils are equal, round, and reactive to light.  Cardiovascular:     Rate and Rhythm: Normal rate and regular rhythm.     Heart sounds: Normal heart sounds.  Pulmonary:     Effort: Pulmonary effort is normal.     Breath sounds: Normal breath sounds.  Abdominal:     General: Bowel sounds are normal.     Palpations: Abdomen is soft.  Musculoskeletal:        General: No tenderness or deformity. Normal range of motion.     Cervical back: Normal range of motion.  Lymphadenopathy:     Cervical: No cervical adenopathy.  Skin:    General: Skin is warm and dry.     Findings: No erythema or rash.  Neurological:     Mental Status: She is alert and oriented to person, place, and time.  Psychiatric:        Behavior: Behavior normal.         Thought Content: Thought content normal.        Judgment: Judgment normal.     Recent Labs    01/11/22 1055  WBC 7.3  HGB 13.9  HCT 43.1  PLT 234    Recent Labs    01/11/22 1055  NA 140  K 4.0  CL 107  CO2 25  GLUCOSE 101*  BUN 11  CREATININE 0.80  CALCIUM 10.5*    Blood smear review: None  Pathology: See above    Assessment and Plan: Carla Clark is a very charming 36 year old premenopausal white female.  She has history of Crohn's disease.  This seems to be under fairly decent control.  However, her problem now appears to be this triple negative breast cancer that she has.  Again, I have to believe that she is going to be BRCA positive.  I really need to see what her staging studies show.  This will dictate how she is treated.  I really have to worry that the CA 27.29 been over 1600 is an indicator that this might be advanced already.  I finalized on her physical exam that would suggest advanced disease.  However, with triple negative breast cancer, this could certainly be aggressive and go to other parts of the body.  She has Crohn's disease.  This could certainly limit how we can treat her.  I know  that with Crohn's disease, we might not be able to utilize immunotherapy which has been shown to be incredibly effective for triple negative breast cancer.  I think that if she is BRCA positive, we could easily use a PARP inhibitor on her.  Again, the key is going to have to be the staging studies.  I do think a PET scan would be helpful.  I realize she is going to have a CT scan.  However, a PET scan concern to give [place] maybe some more information.  She comes in with her husband.  We had a very long talk.  She does have a strong faith.  She has young children that we want to make sure that she is able to watch grow up.  We certainly may need to get a second opinion as far as treatment goes with respect to systemic therapy.  If she does not have metastatic disease, she  will definitely need to have mastectomies and her ovaries taken out at some point.  She is not interested having any additional children so we do not have to spare ovarian function.  We will await the staging studies.  I do not think we have to do any additional labs on her biopsy for right now.  I did give her a prayer blanket.  She is very thankful for this.

## 2022-01-11 NOTE — Progress Notes (Signed)
This navigator out of the office for patient's new patient appointment. Desk RN gave patient my business card with contact information.  Patient is an establish patient for [facility] with new diagnosis of triple negative breast cancer. Genetics have already been drawn with [facility].   Patient is seeking second opinion with [place] after seeing [facility] earlier this week. She has staging scans scheduled for Friday, but Dr Marin Olp will also like a PET. Will schedule once authorization obtained.   Oncology Nurse Navigator Documentation     01/11/2022    1:00 PM  Oncology Nurse Navigator Flowsheets  Navigator Follow Up Date: 01/12/2022  Navigator Follow Up Reason: Radiology  Navigator Location [facility]  Navigator Encounter Type Appt/Treatment Plan Review  Patient Visit Type MedOnc  Treatment Phase Pre-Tx/Tx Discussion  Barriers/Navigation Needs Coordination of Care;Education  Interventions None Required  Acuity Level 2-Minimal Needs (1-2 Barriers Identified)  Support Groups/Services Friends and Family  Time Spent with Patient 15

## 2022-01-12 ENCOUNTER — Encounter: Payer: Self-pay | Admitting: *Deleted

## 2022-01-12 ENCOUNTER — Telehealth: Payer: Self-pay

## 2022-01-12 ENCOUNTER — Encounter: Payer: Self-pay | Admitting: Internal Medicine

## 2022-01-12 ENCOUNTER — Ambulatory Visit (INDEPENDENT_AMBULATORY_CARE_PROVIDER_SITE_OTHER): Payer: Commercial Managed Care - PPO | Admitting: Internal Medicine

## 2022-01-12 VITALS — BP 110/84 | HR 75 | Ht 63.0 in | Wt 131.2 lb

## 2022-01-12 DIAGNOSIS — K50019 Crohn's disease of small intestine with unspecified complications: Secondary | ICD-10-CM

## 2022-01-12 DIAGNOSIS — N63 Unspecified lump in unspecified breast: Secondary | ICD-10-CM

## 2022-01-12 DIAGNOSIS — L309 Dermatitis, unspecified: Secondary | ICD-10-CM

## 2022-01-12 DIAGNOSIS — C50912 Malignant neoplasm of unspecified site of left female breast: Secondary | ICD-10-CM

## 2022-01-12 DIAGNOSIS — Z171 Estrogen receptor negative status [ER-]: Secondary | ICD-10-CM

## 2022-01-12 LAB — CANCER ANTIGEN 27.29: CA 27.29: 1696 U/mL — ABNORMAL HIGH (ref 0.0–38.6)

## 2022-01-12 NOTE — Patient Instructions (Signed)
Please follow up with Dr Hilarie Fredrickson for a virtual appointment in March 2024.  _______________________________________________________  If you are age 34 or older, your body mass index should be between 23-30. Your Body mass index is 23.25 kg/m. If this is out of the aforementioned range listed, please consider follow up with your Primary Care Provider.  If you are age 3 or younger, your body mass index should be between 19-25. Your Body mass index is 23.25 kg/m. If this is out of the aformentioned range listed, please consider follow up with your Primary Care Provider.   ________________________________________________________  The [facility] providers would like to encourage you to use [facility] to communicate with providers for non-urgent requests or questions.  Due to long hold times on the telephone, sending your provider a message by [facility] may be a faster and more efficient way to get a response.  Please allow 48 business hours for a response.  Please remember that this is for non-urgent requests.  _______________________________________________________ Due to recent changes in healthcare laws, you may see the results of your imaging and laboratory studies on MyChart before your provider has had a chance to review them.  We understand that in some cases there may be results that are confusing or concerning to you. Not all laboratory results come back in the same time frame and the provider may be waiting for multiple results in order to interpret others.  Please give [place] 48 hours in order for your provider to thoroughly review all the results before contacting the office for clarification of your results.

## 2022-01-12 NOTE — Progress Notes (Signed)
Subjective:    Patient ID: Carla Clark, female    DOB: 25-Apr-1985, 36 y.o.   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  She is here alone today and I last saw her on 11/10/2021.  Unfortunately Carla Clark discovered a left breast mass on home self-exam in November.  She found out just 3 days ago that this is breast cancer.  This appears to be at least stage III and oncology is concerned that it may be stage IV.  She will have a port placed soon to undergo chemotherapy and is also aware that she will need surgery.  It has been discovered that her sister has BRCA1 gene mutation.  She will find out the results of her BRCA1 gene test tomorrow.  She is trying to deal with this information the best that she can.  She reports that it is certainly been emotional and that yesterday was a day where she felt weepy and very sad, but today she feels ready to fight.  She is giving this to God.  She has been seen by [facility] oncology as well as surgery but is also getting additional opinions within the [facility] system with Dr. Marin Olp and perhaps with [facility].  She was previously treated with Humira and azathioprine, and then infliximab.  Infliximab was started after enterocolonic fistula repair and right hemicolectomy in August 2021.  Her last dose of infliximab was in September 2023 and she has had 2 of 3 induction infusions with [name].  We changed to [facility] because of ongoing eczema and the apophysis that infliximab was worsening her skin.  She has tolerated the Skyrizi  infusions well.  No change in abdominal symptoms.  No abdominal pain, frequent diarrhea, blood in stool, mucus in stool.  She has noticed tightness and mild soreness in her wrists, ankles and movements.  She feels that her rash is about the same to perhaps slightly better. She does see Dr. Wilhemina Bonito at [facility] dermatology.  Dr. Ronnald Ramp felt that she may also have an allergy to some of the ingredients in hand soap.  Review of Systems As per HPI, otherwise negative  Current Medications, Allergies, Past Medical History, Past Surgical History, Family History and Social History were reviewed in [employer] record.    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   Fistulizing and stricturing ileal Crohn's disease (status post right hemicolectomy and enterocolonic fistula repair June 2021) --she was treated with infliximab from August 2021  to September 2023 changed to [facility] recently due to ongoing rash/eczema.  She is tolerated the 2 infusions well and has no recurrent GI symptoms consistent with Crohn's disease.  The joint symptoms need to be followed to see if they become more prevalent having switched from infliximab.  I think it is too early to tell.  She did not submit fecal calprotectin prior to starting [name]. -- Third Skyrizi infusion scheduled for 02/01/2022 -- After infusion she will  go to subcutaneous therapy 360 mg every 8 weeks -- Consider fecal calprotectin at follow-up -- Monitor rash now that infliximab has been stopped as well as arthropathy symptoms to see if this worsens after anti-TNF therapy removed.  2.  Eczema --significant for her involving hands, forehead, ear canal and nares.  Rash today looks better.  Monitor having a change from infliximab to [name].  Follow-up with dermatology, Dr. Ronnald Ramp  3.  Stage III breast cancer --very unfortunate diagnosis and we have discussed this today at length.  She seems to be handling it remarkably well.  She is an extremely strong person and has strong faith in God.  We have discussed how breast cancer management will take precedent but I am happy to work closely with her oncology if any chemotherapeutic agents cause a flare of her IBD.  She is now on Skyrizi which should have less wound healing concerns compared to infliximab, however I made her aware that she would want to discuss this with her breast surgeon particularly if a dose of Skyrizi needs to be held. -- She will have a port placed soon and follow-up closely with medical and surgical oncology -- We discussed diet and I recommended that she avoid processed sugars, processed meats with preservatives and focus on high-protein diet.  Certainly okay to eat high-fiber foods like kale but would likely be better to do so in a smoothie as opposed to a role given her history of Crohn's disease with strictures.  No recent partial obstructive symptoms but she should let me know if this occurs.  4.  IDA --secondary to #1.  Following with hematology, she is receiving periodic IV iron but has not needed IV iron recently.  5.  B12 deficiency --continue oral B12 daily  Virtual visit with me in March; sooner if needed  40 minutes total spent today including patient facing time, coordination of care, reviewing medical history/procedures/pertinent radiology studies, and documentation of  the encounter.

## 2022-01-12 NOTE — Telephone Encounter (Signed)
CSW attempted to contact patient per the request of Dr. Marin Olp.  Left voicemail.

## 2022-01-12 NOTE — Progress Notes (Signed)
Called and spoke to patient about her plan. She will keep her appointments for staging workup at [facility] since they are scheduled this week. She has also already spoken to a surgeon about a port. Encouraged her to keep these appointments as scheduled so that we don't have to transfer orders and authorization to this health system thereby delaying her care. She does want to be treated with Dr Marin Olp.   Patient needs a PET scan which is scheduled. Patient is aware of PET appointment including date, time, and location. The following prep is reviewed with patient and confirmed with teachback: - arrive 30 minutes before appointment time - NPO except water for 6h before scan. No candy, no gum - hold any diabetic medication the morning of the scan - have a low carb dinner the night prior  MyChart message sent to patient with the above information.  Oncology Nurse Navigator Documentation     01/12/2022    1:00 PM  Oncology Nurse Navigator Flowsheets  Navigator Follow Up Date: 01/28/2022  Navigator Follow Up Reason: Scan Review  Navigator Location [facility]  Navigator Encounter Type Appt/Treatment Plan Review;MyChart;Telephone  Telephone Outgoing Call  Patient Visit Type MedOnc  Treatment Phase Pre-Tx/Tx Discussion  Barriers/Navigation Needs Coordination of Care;Education  Education Other  Interventions Coordination of Care;Education  Acuity Level 2-Minimal Needs (1-2 Barriers Identified)  Coordination of Care Radiology  Education Method Written;Verbal  Support Groups/Services Friends and Family  Time Spent with Patient 30

## 2022-01-13 ENCOUNTER — Inpatient Hospital Stay: Payer: Commercial Managed Care - PPO

## 2022-01-13 NOTE — Progress Notes (Signed)
[place] Clinical Social Work  Initial Assessment   Carla Clark is a 36 y.o. year old female contacted by phone. Clinical Social Work was referred by medical provider for assessment of psychosocial needs.   SDOH (Social Determinants of Health) assessments performed: Yes SDOH Interventions    Flowsheet Row Clinical Support from 01/13/2022 in [facility]  SDOH Interventions   Food Insecurity Interventions Intervention Not Indicated  Housing Interventions Intervention Not Indicated  Transportation Interventions Intervention Not Indicated  Utilities Interventions Intervention Not Indicated  Financial Strain Interventions Financial Counselor       SDOH Screenings   Food Insecurity: No Food Insecurity (01/13/2022)  Housing: Low Risk  (01/13/2022)  Transportation Needs: No Transportation Needs (01/13/2022)  Utilities: Not At Risk (01/13/2022)  Depression (PHQ2-9): Low Risk  (05/04/2021)  Financial Resource Strain: Medium Risk (01/13/2022)  Tobacco Use: Low Risk  (01/12/2022)     Distress Screen completed: No     No data to display            Family/Social Information:  Housing Arrangement: patient lives with her husband and two young children. Family members/support persons in your life? Family, Friends, and Education officer, community concerns: no  Employment: Working full time Patient is a Pharmacist, hospital..  Income source: Employment Financial concerns: Yes, due to illness and/or loss of work during treatment Type of concern: Medical bills Food access concerns: no Religious or spiritual practice: Not known Services Currently in place:  [place]  Coping/ Adjustment to diagnosis: Patient understands treatment plan and what happens next? yes Concerns about diagnosis and/or treatment: How I will pay for the services I need Patient reported stressors: Finances and Adjusting to my illness Hopes and/or priorities: Patient hopes to remain  organized. Patient enjoys time with family/ friends Current coping skills/ strengths: Average or above average intelligence , Capable of independent living , Communication skills , General fund of knowledge , Motivation for treatment/growth , Supportive family/friends , and Work skills     SUMMARY: Current SDOH Barriers:  Financial constraints related to medical expenses.  Clinical Social Work Clinical Goal(s):  Explore community resource options for unmet needs related to:  Financial Strain   Interventions: Discussed common feeling and emotions when being diagnosed with cancer, and the importance of support during treatment Informed patient of the support team roles and support services at [facility] Provided [place] contact information and encouraged patient to call with any questions or concerns Provided education regarding various grants available once treatment begins.  Mailed [employer] and [employer] and Wellness literature with patient permission.   Follow Up Plan: CSW will follow-up with patient by phone  Patient verbalizes understanding of plan: Yes    Rodman Pickle Reiley Keisler, LCSW

## 2022-01-14 ENCOUNTER — Inpatient Hospital Stay: Payer: Commercial Managed Care - PPO

## 2022-01-14 ENCOUNTER — Ambulatory Visit: Payer: Commercial Managed Care - PPO | Admitting: Family

## 2022-01-14 ENCOUNTER — Encounter: Payer: Self-pay | Admitting: *Deleted

## 2022-01-14 NOTE — Progress Notes (Signed)
Reviewed CT scans completed in the [place].   Able to reschedule patient's PET to 01/21/22.   Patient scheduled to have port placed on 01/20/22.  Oncology Nurse Navigator Documentation     01/14/2022    1:00 PM  Oncology Nurse Navigator Flowsheets  Navigator Follow Up Date: 01/21/2022  Navigator Follow Up Reason: Scan Review  Navigator Location [facility]  Navigator Encounter Type Scan Review  Patient Visit Type MedOnc  Treatment Phase Pre-Tx/Tx Discussion  Barriers/Navigation Needs Coordination of Care;Education  Interventions None Required  Acuity Level 2-Minimal Needs (1-2 Barriers Identified)  Support Groups/Services Friends and Family  Time Spent with Patient 15

## 2022-01-20 ENCOUNTER — Other Ambulatory Visit: Payer: Self-pay | Admitting: *Deleted

## 2022-01-20 DIAGNOSIS — D500 Iron deficiency anemia secondary to blood loss (chronic): Secondary | ICD-10-CM

## 2022-01-20 DIAGNOSIS — N63 Unspecified lump in unspecified breast: Secondary | ICD-10-CM

## 2022-01-21 ENCOUNTER — Ambulatory Visit
Admission: RE | Admit: 2022-01-21 | Discharge: 2022-01-21 | Disposition: A | Discharge status: Home / Self Care | Payer: Commercial Managed Care - PPO | Source: Ambulatory Visit | Attending: Hematology & Oncology | Admitting: Hematology & Oncology

## 2022-01-21 DIAGNOSIS — N63 Unspecified lump in unspecified breast: Secondary | ICD-10-CM | POA: Diagnosis present

## 2022-01-21 DIAGNOSIS — N6320 Unspecified lump in the left breast, unspecified quadrant: Secondary | ICD-10-CM | POA: Insufficient documentation

## 2022-01-21 DIAGNOSIS — C7951 Secondary malignant neoplasm of bone: Secondary | ICD-10-CM | POA: Insufficient documentation

## 2022-01-21 LAB — GLUCOSE, CAPILLARY: Glucose-Capillary: 61 mg/dL — ABNORMAL LOW (ref 70–99)

## 2022-01-21 MED ORDER — FLUDEOXYGLUCOSE F - 18 (FDG) INJECTION
7.1100 | Freq: Once | INTRAVENOUS | Status: AC | PRN
  Administered 2022-01-21: 7.11 via INTRAVENOUS

## 2022-01-25 ENCOUNTER — Encounter: Payer: Self-pay | Admitting: *Deleted

## 2022-01-25 NOTE — Progress Notes (Signed)
Per Dr Marin Olp, request for [facility] One testing sent on outside specimen [phone] DOS 01/05/2022.  PET scan reviewed.  Patient will be seen by Dr Rip Harbour this week at [facility] for treatment recommendations.   Oncology Nurse Navigator Documentation     01/25/2022    2:00 PM  Oncology Nurse Navigator Flowsheets  Navigator Follow Up Date: 01/28/2022  Navigator Follow Up Reason: Appointment Review  Navigator Location [facility]  Navigator Encounter Type Scan Review;Appt/Treatment Plan Review  Patient Visit Type MedOnc  Treatment Phase Pre-Tx/Tx Discussion  Barriers/Navigation Needs Coordination of Care;Education  Interventions Coordination of Care  Acuity Level 2-Minimal Needs (1-2 Barriers Identified)  Coordination of Care Pathology  Support Groups/Services Friends and Family  Time Spent with Patient 30

## 2022-01-27 ENCOUNTER — Ambulatory Visit (INDEPENDENT_AMBULATORY_CARE_PROVIDER_SITE_OTHER): Payer: Commercial Managed Care - PPO | Admitting: Psychologist

## 2022-01-27 DIAGNOSIS — F320 Major depressive disorder, single episode, mild: Secondary | ICD-10-CM | POA: Diagnosis not present

## 2022-01-27 DIAGNOSIS — Z634 Disappearance and death of family member: Secondary | ICD-10-CM | POA: Diagnosis not present

## 2022-01-27 NOTE — Progress Notes (Signed)
[facility] Counselor/Therapist Progress Note  Patient ID: Carla Clark, MRN: [id number],    Date: 01/27/2022  Time Spent: 12:01 pm to 12:41 pm; total time: 40 minutes   This session was held via video webex teletherapy due to the coronavirus risk at this time. The patient consented to video teletherapy and was located at her home during this session. She is aware it is the responsibility of the patient to secure confidentiality on her end of the session. The provider was in a private home office for the duration of this session. Limits of confidentiality were discussed with the patient.   Treatment Type: Individual Therapy  Reported Symptoms: Experiencing some distress with new cancer diagnosis  Mental Status Exam: Appearance:  Well Groomed     Behavior: Appropriate  Motor: Normal  Speech/Language:  Clear and Coherent  Affect: Appropriate  Mood: normal  Thought process: normal  Thought content:   WNL  Sensory/Perceptual disturbances:   WNL  Orientation: oriented to person, place, and time/date  Attention: Good  Concentration: Good  Memory: WNL  Fund of knowledge:  Good  Insight:   Good  Judgment:  Good  Impulse Control: Good   Risk Assessment: Danger to Self:  No Self-injurious Behavior: No Danger to Others: No Duty to Warn:no Physical Aggression / Violence:No  Access to Firearms a concern: No  Gang Involvement:No   Subjective: Beginning the session, patient described herself as okay while talking about the holidays. Per the patient, she is still completing tests to determine what stage and what type of treatment she will undergo for cancer. She spent the session processing the different thoughts and emotions she has been experiencing since she was diagnosed. Per the patient, she is concerned about not wanting to suffer and making sure her pain is well controlled. She was agreeable to following up. She denied suicidal and homicidal ideation.     Interventions:  Worked on developing a therapeutic relationship with the patient using active listening and reflective statements. Provided emotional support using empathy and validation. Reviewed the treatment plan with the patient. Praised the patient for doing as well as can be expected considering the new diagnosis. Normalized and validated expressed thoughts and emotions. Praised the patient for vulnerability and transparency that the patient has expressed towards others. Validated patient's current experience. Identified patient's biggest concerns and validated them. Provided psychoeducation about palliative care based medicine. Discussed next steps for counseling. Provided empathic statements. Assessed for suicidal and homicidal ideation.   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   Plan:  Goals Alleviate depressive symptoms Recognize, accept, and cope with depressive feelings Develop healthy thinking patterns Develop healthy interpersonal relationships Begin a healthy grieving process  Objectives target date for all objectives is 07/01/2022 Cooperate with a medication evaluation by a physician Verbalize an accurate understanding of depression Verbalize an understanding of the treatment Identify and replace thoughts that support depression Learn and implement behavioral strategies Verbalize an understanding and resolution of current interpersonal problems Learn and implement problem solving and decision making skills Learn and implement conflict resolution skills to resolve interpersonal problems Verbalize an understanding of healthy and unhealthy emotions verbalize insight into how past relationships may be influence current experiences with depression Use mindfulness and acceptance strategies and increase value based behavior  Increase hopeful  statements about the future.  Tell in detail the story of the current loss that is  triggering symptoms Read books on the topic of grief Watch videos on the theme of grief Begin verbalizing feelings associated with the loss Attend a grief support group express thoughts and feelings about the deceased Identify and voice positives about the deceased implement acts of spiritual faith  Interventions Consistent with treatment model, discuss how change in cognitive, behavioral, and interpersonal can help client alleviate depression CBT Behavioral activation help the client explore the relationship, nature of the dispute,  Help the client develop new interpersonal skills and relationships Conduct Problem so living therapy Teach conflict resolution skills Use a process-experiential approach Conduct TLDP Conduct ACT Evaluate need for psychotropic medication Monitor adherence to medication  create a safe environment and actively build trust use empathy, compassion, and support ask the patient to write a letter to the lost person conduct empty chair ask the patient to discuss and list the positives and negative aspects of the person encourage patient to rely upon his/her spiritual faith  ask client to read books on grief ask patient to watch videos about grief assist patient in identifying emotions  ask patient to attend support group   The patient and clinician reviewed the treatment plan on 07/23/2021. The patient approved of the treatment plan.     Conception Chancy, PsyD

## 2022-01-28 ENCOUNTER — Telehealth: Payer: Self-pay | Admitting: Internal Medicine

## 2022-01-28 ENCOUNTER — Encounter: Payer: Self-pay | Admitting: *Deleted

## 2022-01-28 ENCOUNTER — Ambulatory Visit (HOSPITAL_COMMUNITY): Payer: Commercial Managed Care - PPO

## 2022-01-28 NOTE — Progress Notes (Signed)
Received a call from the patient. She is scheduled to receive treatment for her Chron's on 02/01/2022. She wants to make sure it's okay for her to take [place].   Spoke to Dr Marin Olp. He is okay with patient proceeding with her Skyrizi treatment next week.   Patient was seen by Dr Rip Harbour on Wednesday and recommendation for bone biopsy was made. This was reviewed with Dr Marin Olp. He plans on reviewing the note more thoroughly and calling Dr Rip Harbour. Patient updated to this plan.   Oncology Nurse Navigator Documentation     01/28/2022    1:00 PM  Oncology Nurse Navigator Flowsheets  Navigator Follow Up Date: 02/01/2022  Navigator Follow Up Reason: Appointment Review  Navigator Location [facility]  Navigator Encounter Type Appt/Treatment Plan Review;Telephone  Telephone Medication Assistance;Incoming Call  Patient Visit Type MedOnc  Treatment Phase Pre-Tx/Tx Discussion  Barriers/Navigation Needs Coordination of Care;Education  Education Other  Interventions Education;Medication Assistance;Psycho-Social Support  Acuity Level 2-Minimal Needs (1-2 Barriers Identified)  Coordination of Care Other  Education Method Verbal  Support Groups/Services Friends and Family  Time Spent with Patient 25

## 2022-01-28 NOTE — Telephone Encounter (Signed)
Patient called, stated she has been recently diagnosed with cancer and will soon start chemotherapy. Patient stated for her infusion of Skyrizi on Monday and she is wondering if it is safe to proceed with infusion. Please advise.

## 2022-01-28 NOTE — Telephone Encounter (Signed)
Per Dr. Hilarie Fredrickson he is ok with pt getting the skyrizi infusion but would defer this decision to her oncologist. Left message for pt to call back.  Spoke with pt and she is aware and will check with her oncologist.

## 2022-02-01 ENCOUNTER — Ambulatory Visit (INDEPENDENT_AMBULATORY_CARE_PROVIDER_SITE_OTHER): Payer: Commercial Managed Care - PPO

## 2022-02-01 ENCOUNTER — Encounter: Payer: Self-pay | Admitting: *Deleted

## 2022-02-01 VITALS — BP 112/76 | HR 78 | Temp 98.3°F | Resp 16 | Ht 63.0 in | Wt 130.0 lb

## 2022-02-01 DIAGNOSIS — K50019 Crohn's disease of small intestine with unspecified complications: Secondary | ICD-10-CM

## 2022-02-01 MED ORDER — RISANKIZUMAB-RZAA 600 MG/10ML IV SOLN
600.0000 mg | Freq: Once | INTRAVENOUS | Status: AC
  Administered 2022-02-01: 600 mg via INTRAVENOUS
  Filled 2022-02-01: qty 10

## 2022-02-01 NOTE — Progress Notes (Signed)
Diagnosis: Crohn's Disease  Provider:  Marshell Garfinkel MD  Procedure: Infusion  IV Type: Peripheral, IV Location: R Antecubital  Skyrizi (risankizumab-rzaa), Dose: 600 mg  Infusion Start Time: 3202  Infusion Stop Time: 1205  Post Infusion IV Care: Peripheral IV Discontinued  Discharge: Condition: Good, Destination: Home . AVS provided to patient.   Performed by:  Adelina Mings, LPN

## 2022-02-01 NOTE — Progress Notes (Signed)
Bone biopsy ordered by Dr Rip Harbour scheduled for 02/09/22.  Oncology Nurse Navigator Documentation     02/01/2022    2:45 PM  Oncology Nurse Navigator Flowsheets  Navigator Follow Up Date: 02/09/2022  Navigator Follow Up Reason: Surgery  Navigator Location [facility]  Navigator Encounter Type Appt/Treatment Plan Review;Telephone  Patient Visit Type MedOnc  Treatment Phase Pre-Tx/Tx Discussion  Barriers/Navigation Needs Coordination of Care;Education  Interventions None Required  Acuity Level 2-Minimal Needs (1-2 Barriers Identified)  Support Groups/Services Friends and Family  Time Spent with Patient 15

## 2022-02-02 ENCOUNTER — Inpatient Hospital Stay: Payer: Commercial Managed Care - PPO | Attending: Family | Admitting: Hematology & Oncology

## 2022-02-02 ENCOUNTER — Other Ambulatory Visit: Payer: Self-pay | Admitting: Pharmacy Technician

## 2022-02-02 ENCOUNTER — Inpatient Hospital Stay: Payer: Commercial Managed Care - PPO | Admitting: Licensed Clinical Social Worker

## 2022-02-02 ENCOUNTER — Encounter: Payer: Self-pay | Admitting: Hematology & Oncology

## 2022-02-02 VITALS — BP 125/83 | HR 95 | Temp 98.5°F | Resp 17 | Wt 125.1 lb

## 2022-02-02 DIAGNOSIS — Z171 Estrogen receptor negative status [ER-]: Secondary | ICD-10-CM | POA: Diagnosis not present

## 2022-02-02 DIAGNOSIS — C7951 Secondary malignant neoplasm of bone: Secondary | ICD-10-CM | POA: Insufficient documentation

## 2022-02-02 DIAGNOSIS — Z1501 Genetic susceptibility to malignant neoplasm of breast: Secondary | ICD-10-CM | POA: Insufficient documentation

## 2022-02-02 DIAGNOSIS — C50912 Malignant neoplasm of unspecified site of left female breast: Secondary | ICD-10-CM

## 2022-02-02 HISTORY — DX: Malignant neoplasm of unspecified site of left female breast: C50.912

## 2022-02-02 NOTE — Progress Notes (Signed)
[facility] CSW Progress Note  Holiday representative met with patient to assess needs.  Patient was with her husband, Shanon Brow, sister and mother for the visit with Dr. Marin Olp.  Patient had CSW contact information and expressed understanding of social work role.  She will contact CSW as her needs increase.    Rodman Pickle [place], LCSW

## 2022-02-02 NOTE — Progress Notes (Signed)
Hematology and Oncology Follow Up Visit  Carla Clark [id number] [birth year] 36 y.o. 02/02/2022   Principle Diagnosis:  Stage IV triple negative breast cancer-bone metastasis/lymph no metastasis -- BRCA (+)  Current Therapy:   Observation     Interim History:  Carla Clark is comes in with her family.  We talking about the results of her studies.  She did have the PET scan done.  She clearly has a cancer that is quite prevalent for her bones.  She also has some adenopathy.  She has a primary mass in the left breast.  There might be a lesion in the liver.  Her initial CA 27.29 was over 1700.  She does have the Port-A-Cath in already.  We did send her out to [facility]  She saw the wonderful Dr. Rip Harbour.  Dr. Rip Harbour wants to have a bone biopsy done to confirm metastatic disease.  We do not have back the NGS studies as of yet.  Dr. Rip Harbour does state that there is a clinical trial that Carla Clark would be a candidate for.  I think this would be incredibly helpful.  This utilizes immunotherapy along with a small molecule agent and chemotherapy.  I had a long talk with Carla Clark and her family about clinical trials.  I try to explain how clinical trials work.  I explained some of the criteria and restrictions that clinical trials require.  She feels well.  She is eating well.  She is trying to limit her sugar intake.  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  She has had no pain issues.  There is no change in bowel or bladder habits.  She has had no cough or shortness of breath.  She has no nausea or vomiting.  There is been no rashes.  She has had no bleeding.  She has had no leg swelling.  There has been no headaches.  Currently, I would say performance status is probably ECOG 0.  Medications:  Current Outpatient Medications:    hydrocortisone (ANUSOL-HC) 2.5 % rectal cream, Place 1 Application rectally 2 (two) times daily., Disp: 28 g, Rfl: 1   levonorgestrel (MIRENA, 52 MG,) 20  MCG/DAY IUD, Mirena 20 mcg/24 hours (8 yrs) 52 mg intrauterine device  Take by intrauterine route., Disp: , Rfl:    Risankizumab-rzaa (SKYRIZI [place]), Inject into the skin., Disp: , Rfl:    vitamin B-12 (CYANOCOBALAMIN) 100 MCG tablet, Take 100 mcg by mouth daily., Disp: , Rfl:    mometasone (ELOCON) 0.1 % lotion, 1 application to affected area Externally Twice a day for 14 days (Patient not taking: Reported on 02/02/2022), Disp: , Rfl:    Shampoos (CLN MOISTURE RICH GENTLE) SHAM, as directed Externally (Patient not taking: Reported on 02/02/2022), Disp: , Rfl:    Sulfacetamide Sodium-Sulfur 8-4 % SUSP, 1 application to affected area Externally Once a day (Patient not taking: Reported on 02/02/2022), Disp: , Rfl:   Allergies: No Known Allergies  Past Medical History, Surgical history, Social history, and Family History were reviewed and updated.  Review of Systems: Review of Systems  Constitutional: Negative.   HENT:  Negative.    Eyes: Negative.   Respiratory: Negative.    Cardiovascular: Negative.   Gastrointestinal: Negative.   Endocrine: Negative.   Genitourinary: Negative.    Musculoskeletal: Negative.   Skin: Negative.   Neurological: Negative.   Hematological: Negative.   Psychiatric/Behavioral: Negative.      Physical Exam:  weight is 125 lb 1.3 oz (56.7 kg).  Her oral temperature is 98.5 F (36.9 C). Her blood pressure is 125/83 and her pulse is 95. Her respiration is 17 and oxygen saturation is 100%.   Wt Readings from Last 3 Encounters:  02/02/22 125 lb 1.3 oz (56.7 kg)  02/01/22 130 lb (59 kg)  01/12/22 131 lb 4 oz (59.5 kg)    Physical Exam Vitals reviewed.  Constitutional:      Comments: On her breast exam, right breast is unremarkable.  Left breast shows the mass in the upper outer quadrant.  This is about 7 cm in size.  It is nontender not mobile.  There is some slight fullness in the left axilla.  HENT:     Head: Normocephalic and atraumatic.  Eyes:     Pupils:  Pupils are equal, round, and reactive to light.  Cardiovascular:     Rate and Rhythm: Normal rate and regular rhythm.     Heart sounds: Normal heart sounds.  Pulmonary:     Effort: Pulmonary effort is normal.     Breath sounds: Normal breath sounds.  Abdominal:     General: Bowel sounds are normal.     Palpations: Abdomen is soft.  Musculoskeletal:        General: No tenderness or deformity. Normal range of motion.     Cervical back: Normal range of motion.  Lymphadenopathy:     Cervical: No cervical adenopathy.  Skin:    General: Skin is warm and dry.     Findings: No erythema or rash.  Neurological:     Mental Status: She is alert and oriented to person, place, and time.  Psychiatric:        Behavior: Behavior normal.        Thought Content: Thought content normal.        Judgment: Judgment normal.      Lab Results  Component Value Date   WBC 7.3 01/11/2022   HGB 13.9 01/11/2022   HCT 43.1 01/11/2022   MCV 88.1 01/11/2022   PLT 234 01/11/2022     Chemistry      Component Value Date/Time   NA 140 01/11/2022 1055   K 4.0 01/11/2022 1055   CL 107 01/11/2022 1055   CO2 25 01/11/2022 1055   BUN 11 01/11/2022 1055   CREATININE 0.80 01/11/2022 1055   CREATININE 0.62 08/20/2012 1139      Component Value Date/Time   CALCIUM 10.5 (H) 01/11/2022 1055   ALKPHOS 59 01/11/2022 1055   AST 34 01/11/2022 1055   ALT 45 (H) 01/11/2022 1055   BILITOT 0.5 01/11/2022 1055      Impression and Plan: Carla Clark is a very charming 37 year old premenopausal white female.  She has triple negative metastatic breast cancer.  She is BRCA positive.  We will get her out to do for a clinical trial.  I am happy that she qualifies.  Given her young age and great performance status, I really think a clinical trial is the best option for her with respect to getting a good response.  She and her family are very well aware that she has metastatic disease.  We can treat this.  We are not going  to cure this.  Hopefully, given the fact that her disease is majority in the bones, hopefully this is a decent prognostic factor for her.  Given the fact that she is BRCA positive, a PARP inhibitor can also be used.  She clearly will need a bisphosphonate/Xgeva for her bone disease.  I explained to her that clinical trials are quite strict.  She will have to be treated out at [facility].  I told that I was not sure how often she would have to go out there.  I would think that typically the chemotherapy that is used is every 3 weeks or so.  I spent a good hour with him today.  I answered his many questions I could.  She has great support.  She is mentally very well ready for treatment.  She just wants to get treatment started.  Hopefully, we can get the treatment going for her on protocol in a couple weeks at the latest.  I will plan to have her come back to see [place] depending on her treatments on protocol at [facility].   Volanda Napoleon, MD 1/10/20245:45 PM

## 2022-02-03 ENCOUNTER — Encounter: Payer: Self-pay | Admitting: *Deleted

## 2022-02-03 NOTE — Progress Notes (Signed)
Patient is going to proceed with treatment on protocol at [facility]. She may still follow up with [place] as needed.   Called patient and explained that since she is receiving her care at [facility], I will not longer actively navigate her case, however she is welcome to reach out to me/this office anytime for assistance, or transfer care if her situation changes.   Oncology Nurse Navigator Documentation     02/03/2022    9:15 AM  Oncology Nurse Navigator Flowsheets  Navigation Complete Date: 02/03/2022  Post Navigation: Continue to Follow Patient? No  Reason Not Navigating Patient: Seeking Care elsewhere  Navigator Location [facility]  Navigator Encounter Type Appt/Treatment Plan Review;Telephone  Telephone Outgoing Call  Patient Visit Type MedOnc  Treatment Phase Pre-Tx/Tx Discussion  Barriers/Navigation Needs Coordination of Care;Education  Education Other  Interventions Education;Psycho-Social Support  Acuity Level 2-Minimal Needs (1-2 Barriers Identified)  Education Method Verbal  Support Groups/Services Friends and Family  Time Spent with Patient 15

## 2022-02-15 ENCOUNTER — Ambulatory Visit (INDEPENDENT_AMBULATORY_CARE_PROVIDER_SITE_OTHER): Payer: Commercial Managed Care - PPO | Admitting: Psychologist

## 2022-02-15 DIAGNOSIS — F320 Major depressive disorder, single episode, mild: Secondary | ICD-10-CM | POA: Diagnosis not present

## 2022-02-15 DIAGNOSIS — Z634 Disappearance and death of family member: Secondary | ICD-10-CM | POA: Diagnosis not present

## 2022-02-15 NOTE — Progress Notes (Signed)
[place] Counselor/Therapist Progress Note  Patient ID: Carla Clark, MRN: [id number],    Date: 02/15/2022  Time Spent: 12:02 pm to 12:56 pm; total time: 54 minutes   This session was held via video webex teletherapy due to the coronavirus risk at this time. The patient consented to video teletherapy and was located at her home during this session. She is aware it is the responsibility of the patient to secure confidentiality on her end of the session. The provider was in a private home office for the duration of this session. Limits of confidentiality were discussed with the patient.   Treatment Type: Individual Therapy  Reported Symptoms: Experiencing distress due to cancer  Mental Status Exam: Appearance:  Well Groomed     Behavior: Appropriate  Motor: Normal  Speech/Language:  Clear and Coherent  Affect: Appropriate  Mood: normal  Thought process: normal  Thought content:   WNL  Sensory/Perceptual disturbances:   WNL  Orientation: oriented to person, place, and time/date  Attention: Good  Concentration: Good  Memory: WNL  Fund of knowledge:  Good  Insight:   Good  Judgment:  Good  Impulse Control: Good   Risk Assessment: Danger to Self:  No Self-injurious Behavior: No Danger to Others: No Duty to Warn:no Physical Aggression / Violence:No  Access to Firearms a concern: No  Gang Involvement:No   Subjective: Beginning the session, patient described herself as having a more difficult time with her cancer diagnosis and what it meant for her. She processed and reflected on thoughts, emotions, and themes associated with experiencing cancer. She was agreeable to following up. She denied suicidal and homicidal ideation.    Interventions:  Worked on developing a therapeutic relationship with the patient using active listening and reflective statements. Provided emotional support using empathy and validation. Reviewed the treatment plan with the patient.  Normalized and validated expressed thoughts and emotions. Processed emotions. Validated patient's experience. Identified several themes and processed them. Assisted in problem solving. Used self-disclosure. Challenged some of the thoughts to assist the patient. Provided empathic statements. Assessed for suicidal and homicidal ideation.   Homework: Write letters to anger, and her body for betraying her  Next Session: Emotional support  Diagnosis: F32.0 major depressive affective disorder, single episode, mild and W29.5 uncomplicated bereavement.   Plan:  Goals Alleviate depressive symptoms Recognize, accept, and cope with depressive feelings Develop healthy thinking patterns Develop healthy interpersonal relationships Begin a healthy grieving process  Objectives target date for all objectives is 07/01/2022 Cooperate with a medication evaluation by a physician Verbalize an accurate understanding of depression Verbalize an understanding of the treatment Identify and replace thoughts that support depression Learn and implement behavioral strategies Verbalize an understanding and resolution of current interpersonal problems Learn and implement problem solving and decision making skills Learn and implement conflict resolution skills to resolve interpersonal problems Verbalize an understanding of healthy and unhealthy emotions verbalize insight into how past relationships may be influence current experiences with depression Use mindfulness and acceptance strategies and increase value based behavior  Increase hopeful statements about the future.  Tell in detail the story of the current loss that is triggering symptoms Read books on the topic of grief Watch videos on the theme of grief Begin verbalizing feelings associated with the loss Attend a grief support group express thoughts and feelings about the deceased Identify and voice positives about the deceased implement acts of spiritual  faith  Interventions Consistent with treatment model, discuss how change in  cognitive, behavioral, and interpersonal can help client alleviate depression CBT Behavioral activation help the client explore the relationship, nature of the dispute,  Help the client develop new interpersonal skills and relationships Conduct Problem so living therapy Teach conflict resolution skills Use a process-experiential approach Conduct TLDP Conduct ACT Evaluate need for psychotropic medication Monitor adherence to medication  create a safe environment and actively build trust use empathy, compassion, and support ask the patient to write a letter to the lost person conduct empty chair ask the patient to discuss and list the positives and negative aspects of the person encourage patient to rely upon his/her spiritual faith  ask client to read books on grief ask patient to watch videos about grief assist patient in identifying emotions  ask patient to attend support group   The patient and clinician reviewed the treatment plan on 07/23/2021. The patient approved of the treatment plan.     Conception Chancy, PsyD

## 2022-02-16 ENCOUNTER — Telehealth: Payer: Self-pay

## 2022-02-16 NOTE — Telephone Encounter (Signed)
Per Dr. Antonieta Pert request this RN called patient and left a VM. Dr. Marin Olp wanted to find out how patient was feeling and if she had started her treatment at [facility]. VM left for patient to call office back and update the office at her convenience.

## 2022-02-21 ENCOUNTER — Encounter: Payer: Self-pay | Admitting: *Deleted

## 2022-02-21 ENCOUNTER — Encounter: Payer: Self-pay | Admitting: Internal Medicine

## 2022-02-21 ENCOUNTER — Telehealth: Payer: Self-pay | Admitting: Internal Medicine

## 2022-02-21 NOTE — Telephone Encounter (Signed)
See note below and advise.

## 2022-02-21 NOTE — Telephone Encounter (Signed)
Inbound call from patient, stated her Oncologist from [facility] Dr. Jake Michaelis stated before she could start her treatment with them, they needed clearance from Dr. Hilarie Fredrickson stating that the patient could proceed with treatment. Patient stated office gave her a fax, [phone]. Attn: Vicenta Aly. Please advise

## 2022-02-21 NOTE — Telephone Encounter (Signed)
Letter faxed to [facility] as requested.

## 2022-02-21 NOTE — Telephone Encounter (Signed)
I spoke to patient directly by phone. I will be sending you a letter to be faxed to the number in this message  We will plan to continue Skyrizi as she under goes breast cancer trial for triple negative metastatic breast cancer  Please make ambassador for [place] know that we are continuing therapy

## 2022-02-21 NOTE — Progress Notes (Signed)
[facility] report received. Results faxed to [facility] as they are the treating location [phone]  Called the navigator, Namon Cirri [phone]) and told her to expect results via fax. She has my number for any needed follow up.   Oncology Nurse Navigator Documentation     02/21/2022    8:15 AM  Oncology Nurse Navigator Flowsheets  Navigator Location [facility]  Navigator Encounter Type Pathology Review  Patient Visit Type MedOnc  Treatment Phase Other  Barriers/Navigation Needs Coordination of Care  Interventions Coordination of Care  Acuity Level 2-Minimal Needs (1-2 Barriers Identified)  Coordination of Care Pathology;Other  Support Groups/Services Friends and Family  Time Spent with Patient 15

## 2022-02-22 NOTE — Telephone Encounter (Signed)
Message left for pts [employer] that pt will be continuing on skyrizi.  See contact info below from pt:  [place],    Thank you so much for your help. I've been working with Carla Clark and her phone number is [phone]. Please let me know if you need anything else.   Thank you,  Carla Clark

## 2022-02-24 ENCOUNTER — Other Ambulatory Visit: Payer: Self-pay

## 2022-02-24 ENCOUNTER — Inpatient Hospital Stay (HOSPITAL_BASED_OUTPATIENT_CLINIC_OR_DEPARTMENT_OTHER): Payer: Commercial Managed Care - PPO | Admitting: Hematology & Oncology

## 2022-02-24 ENCOUNTER — Inpatient Hospital Stay: Payer: Commercial Managed Care - PPO | Attending: Family

## 2022-02-24 ENCOUNTER — Encounter: Payer: Self-pay | Admitting: *Deleted

## 2022-02-24 ENCOUNTER — Encounter: Payer: Self-pay | Admitting: Hematology & Oncology

## 2022-02-24 VITALS — BP 124/87 | HR 105 | Temp 98.4°F | Resp 18 | Ht 63.0 in | Wt 123.1 lb

## 2022-02-24 DIAGNOSIS — Z79899 Other long term (current) drug therapy: Secondary | ICD-10-CM | POA: Diagnosis not present

## 2022-02-24 DIAGNOSIS — C50912 Malignant neoplasm of unspecified site of left female breast: Secondary | ICD-10-CM | POA: Diagnosis not present

## 2022-02-24 DIAGNOSIS — C50412 Malignant neoplasm of upper-outer quadrant of left female breast: Secondary | ICD-10-CM | POA: Diagnosis present

## 2022-02-24 DIAGNOSIS — Z171 Estrogen receptor negative status [ER-]: Secondary | ICD-10-CM | POA: Diagnosis not present

## 2022-02-24 DIAGNOSIS — Z5111 Encounter for antineoplastic chemotherapy: Secondary | ICD-10-CM | POA: Insufficient documentation

## 2022-02-24 DIAGNOSIS — K5090 Crohn's disease, unspecified, without complications: Secondary | ICD-10-CM | POA: Diagnosis not present

## 2022-02-24 DIAGNOSIS — D696 Thrombocytopenia, unspecified: Secondary | ICD-10-CM | POA: Insufficient documentation

## 2022-02-24 DIAGNOSIS — Z1501 Genetic susceptibility to malignant neoplasm of breast: Secondary | ICD-10-CM | POA: Insufficient documentation

## 2022-02-24 DIAGNOSIS — C7951 Secondary malignant neoplasm of bone: Secondary | ICD-10-CM | POA: Insufficient documentation

## 2022-02-24 LAB — CBC WITH DIFFERENTIAL (CANCER CENTER ONLY)
Abs Immature Granulocytes: 0.88 10*3/uL — ABNORMAL HIGH (ref 0.00–0.07)
Basophils Absolute: 0.2 10*3/uL — ABNORMAL HIGH (ref 0.0–0.1)
Basophils Relative: 1 %
Eosinophils Absolute: 0.2 10*3/uL (ref 0.0–0.5)
Eosinophils Relative: 1 %
HCT: 34.1 % — ABNORMAL LOW (ref 36.0–46.0)
Hemoglobin: 11 g/dL — ABNORMAL LOW (ref 12.0–15.0)
Immature Granulocytes: 5 %
Lymphocytes Relative: 67 %
Lymphs Abs: 10.9 10*3/uL — ABNORMAL HIGH (ref 0.7–4.0)
MCH: 27.8 pg (ref 26.0–34.0)
MCHC: 32.3 g/dL (ref 30.0–36.0)
MCV: 86.1 fL (ref 80.0–100.0)
Monocytes Absolute: 0.9 10*3/uL (ref 0.1–1.0)
Monocytes Relative: 6 %
Neutro Abs: 3.3 10*3/uL (ref 1.7–7.7)
Neutrophils Relative %: 20 %
Platelet Count: 55 10*3/uL — ABNORMAL LOW (ref 150–400)
RBC: 3.96 MIL/uL (ref 3.87–5.11)
RDW: 14.7 % (ref 11.5–15.5)
WBC Count: 16.5 10*3/uL — ABNORMAL HIGH (ref 4.0–10.5)
nRBC: 4.7 % — ABNORMAL HIGH (ref 0.0–0.2)

## 2022-02-24 LAB — CMP (CANCER CENTER ONLY)
ALT: 100 U/L — ABNORMAL HIGH (ref 0–44)
AST: 106 U/L — ABNORMAL HIGH (ref 15–41)
Albumin: 4.2 g/dL (ref 3.5–5.0)
Alkaline Phosphatase: 86 U/L (ref 38–126)
Anion gap: 8 (ref 5–15)
BUN: 15 mg/dL (ref 6–20)
CO2: 26 mmol/L (ref 22–32)
Calcium: 10.4 mg/dL — ABNORMAL HIGH (ref 8.9–10.3)
Chloride: 103 mmol/L (ref 98–111)
Creatinine: 0.89 mg/dL (ref 0.44–1.00)
GFR, Estimated: >60 mL/min (ref >60)
Glucose, Bld: 99 mg/dL (ref 70–99)
Potassium: 4.2 mmol/L (ref 3.5–5.1)
Sodium: 137 mmol/L (ref 135–145)
Total Bilirubin: 0.9 mg/dL (ref 0.3–1.2)
Total Protein: 8.1 g/dL (ref 6.5–8.1)

## 2022-02-24 LAB — PREALBUMIN: Prealbumin: 14 mg/dL — ABNORMAL LOW (ref 18–38)

## 2022-02-24 LAB — LACTATE DEHYDROGENASE: LDH: 995 U/L — ABNORMAL HIGH (ref 98–192)

## 2022-02-24 LAB — SAVE SMEAR(SSMR), FOR PROVIDER SLIDE REVIEW

## 2022-02-24 MED ORDER — PYRIDOXINE HCL 250 MG PO TABS: 500.0000 mg | ORAL_TABLET | Freq: Every day | ORAL | 6 refills | Status: DC

## 2022-02-24 NOTE — Progress Notes (Signed)
After full screening, patient is not a candidate for any protocol at [facility]. As such she will return to this office for treatment and management.   Visited with patient prior to her MD appointment. Spoke to her about timeline. She already has a port and is ready to get treatment. Scheduled her for chemo education on Tuesday. Discussed with her social work and nutrition consults and orders placed.   Will follow up with her next week at initiation of treatment.  Oncology Nurse Navigator Documentation     02/24/2022   11:00 AM  Oncology Nurse Navigator Flowsheets  Navigator Follow Up Date: 03/03/2022  Navigator Follow Up Reason: Chemotherapy  Navigator Location [facility]  Navigator Encounter Type Follow-up Appt  Patient Visit Type MedOnc  Treatment Phase Pre-Tx/Tx Discussion  Barriers/Navigation Needs Coordination of Care;Education  Education Newly Diagnosed Cancer Education;Pain/ Symptom Management;Other  Interventions Coordination of Care;Education;Psycho-Social Support;Referrals  Acuity Level 2-Minimal Needs (1-2 Barriers Identified)  Referrals Nutrition/dietician;Social Work  Coordination of Care Appts  Education Method Verbal  Support Groups/Services Friends and Family  Time Spent with Patient 18

## 2022-02-24 NOTE — Progress Notes (Signed)
Hematology and Oncology Follow Up Visit  Carla Clark [id number] [birth year] 36 y.o. 02/24/2022   Principle Diagnosis:  Stage IV triple negative breast cancer-bone metastasis/lymph no metastasis -- BRCA (+)  Current Therapy:   Taxol/Pembrolizumab - start cycle #1 on 03/02/2022 Xgeva 120 mg sq q 3 months -- next dose 03/02/2022     Interim History:  Carla Clark is comes in with her husband.  Unfortunately, she does not qualify for a clinical trial at [facility]  She has developed marked thrombocytopenia.  I suspect that this probably cannot be reflective of bone marrow involvement.  I did look at her peripheral blood smear.  She does have several nucleated red blood cells.  She does have some immature myeloid cells.  As such, we are going to have to treat her locally.  I really had hoped that we get her on a novel protocol given that she is so young.  I think given her thrombocytopenia, our best option would be weekly Taxol.  I still would like to use immunotherapy since she is triple negative.  She is doing okay.  She does have some discomfort.  The discomfort is up into her left shoulder.  I suspect this might be referred pain from where she has a tumor in the left breast.  She has had no problems with bowels or bladder.  She has had no flareups of her Crohn's disease.  I realize that she does have Crohn's disease.  However, I really think that given the issue with her metastatic breast cancer, that we really have to try to treat her as aggressively as possible.  I think this means that we have to consider immunotherapy along with chemotherapy.  She has had no obvious bleeding.  She has had no headache..  She had MRI of the brain yesterday at [facility].  This did not show any intraparenchymal lesions.  She does have the calvarial lesions.  She is still trying to teach.  Currently, I would say that her performance status is probably ECOG 1.  Medications:  Current Outpatient Medications:     hydrocortisone (ANUSOL-HC) 2.5 % rectal cream, Place 1 Application rectally 2 (two) times daily., Disp: 28 g, Rfl: 1   lidocaine-prilocaine (EMLA) cream, Apply 1 Application topically. Apply a dime size of cream to port-a-cath 1-2 hours prior to access. Cover with [place] Mule., Disp: , Rfl:    Risankizumab-rzaa (SKYRIZI [place]), Inject into the skin., Disp: , Rfl:    traMADol (ULTRAM) 50 MG tablet, Take 50 mg by mouth every 6 (six) hours as needed for moderate pain., Disp: , Rfl:    vitamin B-12 (CYANOCOBALAMIN) 100 MCG tablet, Take 100 mcg by mouth daily., Disp: , Rfl:   Allergies: No Known Allergies  Past Medical History, Surgical history, Social history, and Family History were reviewed and updated.  Review of Systems: Review of Systems  Constitutional: Negative.   HENT:  Negative.    Eyes: Negative.   Respiratory: Negative.    Cardiovascular: Negative.   Gastrointestinal: Negative.   Endocrine: Negative.   Genitourinary: Negative.    Musculoskeletal: Negative.   Skin: Negative.   Neurological: Negative.   Hematological: Negative.   Psychiatric/Behavioral: Negative.      Physical Exam:  height is '5\' 3"'$  (1.6 m) and weight is 123 lb 1.3 oz (55.8 kg). Her oral temperature is 98.4 F (36.9 C). Her blood pressure is 124/87 and her pulse is 105 (abnormal). Her respiration is 18 and oxygen saturation is 98%.  Wt Readings from Last 3 Encounters:  02/24/22 123 lb 1.3 oz (55.8 kg)  02/02/22 125 lb 1.3 oz (56.7 kg)  02/01/22 130 lb (59 kg)    Physical Exam Vitals reviewed.  Constitutional:      Comments: On her breast exam, right breast is unremarkable.  Left breast shows the mass in the upper outer quadrant.  This is about 7 cm in size.  It is nontender not mobile.  There is some slight fullness in the left axilla.  HENT:     Head: Normocephalic and atraumatic.  Eyes:     Pupils: Pupils are equal, round, and reactive to light.  Cardiovascular:     Rate and Rhythm: Normal rate and  regular rhythm.     Heart sounds: Normal heart sounds.  Pulmonary:     Effort: Pulmonary effort is normal.     Breath sounds: Normal breath sounds.  Abdominal:     General: Bowel sounds are normal.     Palpations: Abdomen is soft.  Musculoskeletal:        General: No tenderness or deformity. Normal range of motion.     Cervical back: Normal range of motion.  Lymphadenopathy:     Cervical: No cervical adenopathy.  Skin:    General: Skin is warm and dry.     Findings: No erythema or rash.  Neurological:     Mental Status: She is alert and oriented to person, place, and time.  Psychiatric:        Behavior: Behavior normal.        Thought Content: Thought content normal.        Judgment: Judgment normal.      Lab Results  Component Value Date   WBC 16.5 (H) 02/24/2022   HGB 11.0 (L) 02/24/2022   HCT 34.1 (L) 02/24/2022   MCV 86.1 02/24/2022   PLT 55 (L) 02/24/2022     Chemistry      Component Value Date/Time   NA 137 02/24/2022 1039   K 4.2 02/24/2022 1039   CL 103 02/24/2022 1039   CO2 26 02/24/2022 1039   BUN 15 02/24/2022 1039   CREATININE 0.89 02/24/2022 1039   CREATININE 0.62 08/20/2012 1139      Component Value Date/Time   CALCIUM 10.4 (H) 02/24/2022 1039   ALKPHOS 86 02/24/2022 1039   AST 106 (H) 02/24/2022 1039   ALT 100 (H) 02/24/2022 1039   BILITOT 0.9 02/24/2022 1039      Impression and Plan: Carla Clark is a very charming 37 year old premenopausal white female.  She has triple negative metastatic breast cancer.  She is BRCA positive.  I really hate the fact that we cannot do a clinical trial for her.  I know that she was willing to do 1.  We were very close to get her on a clinical trial and then we find out that her platelet count is quite low.  Again had just suspect that she has marrow involvement by her cancer just by virtue of the blood smear.  Again we will try her on treatment with weekly Taxol.  The 1 side effect of Taxol that might bother  her is neuropathy.  I will try to be proactive with this and try her on vitamin B6.  Obviously, we can follow her CA 27.29.  We also have the lesion in the left breast that we can follow for improvement.  She does have the BRCA gene so I will at some point, we can use  a PARP inhibitor.  We will get started next week.  We will do probably 3 weeks on and 1 week off for the Taxol.  We will do the pembrolizumab every 3 weeks.  I know that she has great support from her family.  I know that she is trying incredibly hard.  I will plan to see her back in about a month.   Volanda Napoleon, MD 2/1/20241:41 PM

## 2022-02-24 NOTE — Progress Notes (Signed)
START ON PATHWAY REGIMEN - Breast     Pembrolizumab: A cycle is every 21 days:     Pembrolizumab    Chemotherapy: A cycle is every 28 days:     Paclitaxel   **Always confirm dose/schedule in your pharmacy ordering system**  Patient Characteristics: Distant Metastases or Locoregional Recurrent Disease - Unresected or Locally Advanced Unresectable Disease Progressing after Neoadjuvant and Local Therapies, HER2 Low/Negative, ER Negative, Chemotherapy, HER2 Negative, First Line, PD-L1 Expression  Positive by CPS ?10 Therapeutic Status: Distant Metastases HER2 Status: Negative (-) ER Status: Negative (-) PR Status: Negative (-) Therapy Approach Indicated: Standard Chemotherapy/Endocrine Therapy Line of Therapy: First Line PD-L1 Expression Status: PD-L1 Expression Positive by CPS ?10 Intent of Therapy: Non-Curative / Palliative Intent, Discussed with Patient

## 2022-02-25 ENCOUNTER — Encounter: Payer: Self-pay | Admitting: Family

## 2022-02-25 ENCOUNTER — Other Ambulatory Visit: Payer: Self-pay

## 2022-02-25 ENCOUNTER — Telehealth: Payer: Self-pay

## 2022-02-25 ENCOUNTER — Encounter: Payer: Self-pay | Admitting: Hematology & Oncology

## 2022-02-25 LAB — CANCER ANTIGEN 27.29: CA 27.29: 7873.9 U/mL — ABNORMAL HIGH (ref 0.0–38.6)

## 2022-02-25 NOTE — Telephone Encounter (Signed)
CSW attempted to contact patient per the request of nurse navigator to assess needs.  Left a vm.

## 2022-02-25 NOTE — Progress Notes (Signed)
Pharmacist Chemotherapy Monitoring - Initial Assessment    Anticipated start date: 03/03/22   The following has been reviewed per standard work regarding the patient's treatment regimen: The patient's diagnosis, treatment plan and drug doses, and organ/hematologic function Lab orders and baseline tests specific to treatment regimen  The treatment plan start date, drug sequencing, and pre-medications Prior authorization status  Patient's documented medication list, including drug-drug interaction screen and prescriptions for anti-emetics and supportive care specific to the treatment regimen The drug concentrations, fluid compatibility, administration routes, and timing of the medications to be used The patient's access for treatment and lifetime cumulative dose history, if applicable  The patient's medication allergies and previous infusion related reactions, if applicable   Changes made to treatment plan:  N/A  Follow up needed:  Pending authorization for treatment    Claybon Jabs, [facility], 02/25/2022  1:47 PM

## 2022-03-01 ENCOUNTER — Encounter: Payer: Self-pay | Admitting: *Deleted

## 2022-03-01 ENCOUNTER — Inpatient Hospital Stay: Payer: Commercial Managed Care - PPO

## 2022-03-01 ENCOUNTER — Other Ambulatory Visit: Payer: Self-pay | Admitting: *Deleted

## 2022-03-01 DIAGNOSIS — C50912 Malignant neoplasm of unspecified site of left female breast: Secondary | ICD-10-CM

## 2022-03-01 MED ORDER — PROCHLORPERAZINE MALEATE 10 MG PO TABS: 10.0000 mg | ORAL_TABLET | Freq: Four times a day (QID) | ORAL | 1 refills | Status: DC | PRN

## 2022-03-01 MED ORDER — ONDANSETRON HCL 8 MG PO TABS: 8.0000 mg | ORAL_TABLET | Freq: Three times a day (TID) | ORAL | 1 refills | Status: DC | PRN

## 2022-03-01 NOTE — Progress Notes (Signed)
Patient in chemotherapy education class with sister Carla Clark.  Discussed side effects of Taxol and Keytruda  which include but are not limited to myelosuppression, decreased appetite, fatigue, fever, allergic or infusional reaction, mucositis, cardiac toxicity, cough, SOB, altered taste, nausea and vomiting, diarrhea, constipation, elevated LFTs myalgia and arthralgias, hair loss or thinning, rash, skin dryness, nail changes, peripheral neuropathy, discolored urine, delayed wound healing, mental changes (Chemo brain), increased risk of infections, weight loss.  Reviewed infusion room and office policy and procedure and phone numbers 24 hours x 7 days a week.   Reviewed when to call the office with any concerns or problems.  Scientist, clinical (histocompatibility and immunogenetics) given.  Discussed portacath insertion and EMLA cream administration.  Antiemetic protocol and chemotherapy schedule reviewed. Patient verbalized understanding of chemotherapy indications and possible side effects.  Teachback done

## 2022-03-01 NOTE — Progress Notes (Unsigned)
Patient is high risk for emesis due to Crohn's, hyperemesis of pregnancy, etc. Emend and Aloxi added to patient's careplan per Dr. Antonieta Pert instructions.

## 2022-03-03 ENCOUNTER — Encounter: Payer: Self-pay | Admitting: *Deleted

## 2022-03-03 ENCOUNTER — Inpatient Hospital Stay: Payer: Commercial Managed Care - PPO

## 2022-03-03 ENCOUNTER — Other Ambulatory Visit: Payer: Self-pay

## 2022-03-03 ENCOUNTER — Encounter: Payer: Self-pay | Admitting: Hematology & Oncology

## 2022-03-03 ENCOUNTER — Telehealth: Payer: Self-pay | Admitting: *Deleted

## 2022-03-03 ENCOUNTER — Inpatient Hospital Stay: Payer: Commercial Managed Care - PPO | Admitting: Hematology & Oncology

## 2022-03-03 VITALS — BP 127/81 | HR 108 | Temp 98.0°F | Wt 119.8 lb

## 2022-03-03 VITALS — BP 113/82 | HR 107 | Temp 98.3°F | Resp 18

## 2022-03-03 DIAGNOSIS — C50912 Malignant neoplasm of unspecified site of left female breast: Secondary | ICD-10-CM

## 2022-03-03 DIAGNOSIS — Z5111 Encounter for antineoplastic chemotherapy: Secondary | ICD-10-CM | POA: Diagnosis not present

## 2022-03-03 DIAGNOSIS — D508 Other iron deficiency anemias: Secondary | ICD-10-CM

## 2022-03-03 LAB — CBC WITH DIFFERENTIAL (CANCER CENTER ONLY)
Abs Immature Granulocytes: 1.08 10*3/uL — ABNORMAL HIGH (ref 0.00–0.07)
Basophils Absolute: 0.2 10*3/uL — ABNORMAL HIGH (ref 0.0–0.1)
Basophils Relative: 1 %
Eosinophils Absolute: 0.2 10*3/uL (ref 0.0–0.5)
Eosinophils Relative: 1 %
HCT: 31.1 % — ABNORMAL LOW (ref 36.0–46.0)
Hemoglobin: 10 g/dL — ABNORMAL LOW (ref 12.0–15.0)
Immature Granulocytes: 5 %
Lymphocytes Relative: 72 %
Lymphs Abs: 14.3 10*3/uL — ABNORMAL HIGH (ref 0.7–4.0)
MCH: 27.5 pg (ref 26.0–34.0)
MCHC: 32.2 g/dL (ref 30.0–36.0)
MCV: 85.4 fL (ref 80.0–100.0)
Monocytes Absolute: 0.8 10*3/uL (ref 0.1–1.0)
Monocytes Relative: 4 %
Neutro Abs: 3.3 10*3/uL (ref 1.7–7.7)
Neutrophils Relative %: 17 %
Platelet Count: 28 10*3/uL — ABNORMAL LOW (ref 150–400)
RBC: 3.64 MIL/uL — ABNORMAL LOW (ref 3.87–5.11)
RDW: 14.8 % (ref 11.5–15.5)
WBC Count: 19.9 10*3/uL — ABNORMAL HIGH (ref 4.0–10.5)
nRBC: 3 % — ABNORMAL HIGH (ref 0.0–0.2)

## 2022-03-03 LAB — CMP (CANCER CENTER ONLY)
ALT: 194 U/L — ABNORMAL HIGH (ref 0–44)
AST: 200 U/L (ref 15–41)
Albumin: 4 g/dL (ref 3.5–5.0)
Alkaline Phosphatase: 109 U/L (ref 38–126)
Anion gap: 10 (ref 5–15)
BUN: 21 mg/dL — ABNORMAL HIGH (ref 6–20)
CO2: 23 mmol/L (ref 22–32)
Calcium: 10.2 mg/dL (ref 8.9–10.3)
Chloride: 102 mmol/L (ref 98–111)
Creatinine: 0.82 mg/dL (ref 0.44–1.00)
GFR, Estimated: >60 mL/min (ref >60)
Glucose, Bld: 121 mg/dL — ABNORMAL HIGH (ref 70–99)
Potassium: 4 mmol/L (ref 3.5–5.1)
Sodium: 135 mmol/L (ref 135–145)
Total Bilirubin: 1.1 mg/dL (ref 0.3–1.2)
Total Protein: 7.7 g/dL (ref 6.5–8.1)

## 2022-03-03 LAB — TSH: TSH: 4.077 u[IU]/mL (ref 0.350–4.500)

## 2022-03-03 MED ORDER — SODIUM CHLORIDE 0.9% FLUSH
10.0000 mL | Freq: Once | INTRAVENOUS | Status: AC | PRN
  Administered 2022-03-03: 10 mL

## 2022-03-03 MED ORDER — PALONOSETRON HCL INJECTION 0.25 MG/5ML
0.2500 mg | Freq: Once | INTRAVENOUS | Status: AC
  Administered 2022-03-03: 0.25 mg via INTRAVENOUS
  Filled 2022-03-03: qty 5

## 2022-03-03 MED ORDER — DIPHENHYDRAMINE HCL 50 MG/ML IJ SOLN
50.0000 mg | Freq: Once | INTRAMUSCULAR | Status: AC
  Administered 2022-03-03: 50 mg via INTRAVENOUS
  Filled 2022-03-03: qty 1

## 2022-03-03 MED ORDER — SODIUM CHLORIDE 0.9% FLUSH
10.0000 mL | INTRAVENOUS | Status: DC | PRN
  Administered 2022-03-03: 10 mL

## 2022-03-03 MED ORDER — HEPARIN SOD (PORK) LOCK FLUSH 100 UNIT/ML IV SOLN
500.0000 [IU] | Freq: Once | INTRAVENOUS | Status: AC | PRN
  Administered 2022-03-03: 500 [IU]

## 2022-03-03 MED ORDER — SODIUM CHLORIDE 0.9 % IV SOLN
10.0000 mg | Freq: Once | INTRAVENOUS | Status: AC
  Administered 2022-03-03: 10 mg via INTRAVENOUS
  Filled 2022-03-03: qty 10

## 2022-03-03 MED ORDER — SODIUM CHLORIDE 0.9 % IV SOLN
90.0000 mg/m2 | Freq: Once | INTRAVENOUS | Status: AC
  Administered 2022-03-03: 144 mg via INTRAVENOUS
  Filled 2022-03-03: qty 24

## 2022-03-03 MED ORDER — SODIUM CHLORIDE 0.9 % IV SOLN: Freq: Once | INTRAVENOUS | Status: AC

## 2022-03-03 MED ORDER — SODIUM CHLORIDE 0.9 % IV SOLN
200.0000 mg | Freq: Once | INTRAVENOUS | Status: AC
  Administered 2022-03-03: 200 mg via INTRAVENOUS
  Filled 2022-03-03: qty 8

## 2022-03-03 MED ORDER — SODIUM CHLORIDE 0.9 % IV SOLN
150.0000 mg | Freq: Once | INTRAVENOUS | Status: AC
  Administered 2022-03-03: 150 mg via INTRAVENOUS
  Filled 2022-03-03: qty 150

## 2022-03-03 MED ORDER — FAMOTIDINE IN NACL 20-0.9 MG/50ML-% IV SOLN
20.0000 mg | Freq: Once | INTRAVENOUS | Status: AC
  Administered 2022-03-03: 20 mg via INTRAVENOUS
  Filled 2022-03-03: qty 50

## 2022-03-03 NOTE — Progress Notes (Signed)
Reviewed labwork with Dr Marin Olp.  Platelets 28,000.  ALT 194, AST 200.  Dr Marin Olp is Ok to proceed with treatment today.  Ok with Pulse 108.  1136  Ice placed under hands and feet per patient preference.  Patient tolerated first 15 minutes well.

## 2022-03-03 NOTE — Progress Notes (Signed)
Patient is here with her husband to start treatment. They both feel like it has taken quite a bit of time to start treatment and are excited to get started today. They attended chemo education earlier this week and feel comfortable with go forward. They have no questions at this time.   They know I will call them tomorrow to check and see how she is feeling. Reminded her of the on-call service incase she has any difficulty this evening/night.   Her blood counts and chemistries have worsened in the last week. Dr Marin Olp has been notified and order to proceed with treatment is received.   Oncology Nurse Navigator Documentation     03/03/2022    8:30 AM  Oncology Nurse Navigator Flowsheets  Planned Course of Treatment Chemotherapy  Phase of Treatment Chemo  Chemotherapy Actual Start Date: 03/03/2022  Navigator Follow Up Date: 03/04/2022  Navigator Follow Up Reason: Patient Call  Navigator Location [facility]  Navigator Encounter Type Treatment  Treatment Initiated Date 03/03/2022  Patient Visit Type MedOnc  Treatment Phase First Chemo Tx  Barriers/Navigation Needs Coordination of Care;Education  Education Pain/ Symptom Management;Other  Interventions Education;Psycho-Social Support  Acuity Level 2-Minimal Needs (1-2 Barriers Identified)  Education Method Verbal  Support Groups/Services Friends and Family  Time Spent with Patient 30  Genetic Counseling Type Urgent  Genetic Counseling Date 01/10/2022

## 2022-03-03 NOTE — Telephone Encounter (Signed)
Dr. Marin Olp notified of AST-200.  OK to treat today per order of Dr. Marin Olp.

## 2022-03-03 NOTE — Progress Notes (Signed)
Hematology and Oncology Follow Up Visit  Carla Clark [id number] 1985/06/02 36 y.o. 03/03/2022   Principle Diagnosis:  Stage IV triple negative breast cancer-bone metastasis/lymph no metastasis -- BRCA (+)  Current Therapy:   Taxol/Pembrolizumab - start cycle #1 on 03/02/2022 Xgeva 120 mg sq q 3 months -- next dose 03/02/2022     Interim History:  Carla Clark is comes in with her husband.  She will start treatment today.  We really need to get treatment started on her.  It is clear that this tumor is progressing rapidly.  Her CA 27.29 oh is now up to about 7900.  6 weeks ago it was 1700.  I think more importantly is the fact that her platelet count is going down.  I suspect that she probably has marrow involvement by the appearance of her peripheral blood smear.  She is losing weight.  I will try her on some Marinol to see if this may help with the weight gain.  Her appetite is down a little bit.  She has had no problems with pain.  She has had no cough or shortness of breath.  She has had no obvious change in bowel or bladder habits.  There is been no rashes.  She has had no obvious bleeding.  She has noted that the mass in the left breast is getting larger.  There is little bit of uncomfort in the left axilla.  Clinically, I would have said that her performance status is about ECOG 1.   Medications:  Current Outpatient Medications:    hydrocortisone (ANUSOL-HC) 2.5 % rectal cream, Place 1 Application rectally 2 (two) times daily., Disp: 28 g, Rfl: 1   lidocaine-prilocaine (EMLA) cream, Apply 1 Application topically. Apply a dime size of cream to port-a-cath 1-2 hours prior to access. Cover with [employer]., Disp: , Rfl:    ondansetron (ZOFRAN) 8 MG tablet, Take 1 tablet (8 mg total) by mouth every 8 (eight) hours as needed for nausea or vomiting., Disp: 30 tablet, Rfl: 1   prochlorperazine (COMPAZINE) 10 MG tablet, Take 1 tablet (10 mg total) by mouth every 6 (six) hours as needed  for nausea or vomiting., Disp: 30 tablet, Rfl: 1   pyridoxine (NEURO-K-250 VITAMIN B6) 250 MG tablet, Take 2 tablets (500 mg total) by mouth daily., Disp: 60 tablet, Rfl: 6   Risankizumab-rzaa (SKYRIZI W[place]), Inject into the skin., Disp: , Rfl:    vitamin B-12 (CYANOCOBALAMIN) 100 MCG tablet, Take 100 mcg by mouth daily., Disp: , Rfl:   Allergies: No Known Allergies  Past Medical History, Surgical history, Social history, and Family History were reviewed and updated.  Review of Systems: Review of Systems  Constitutional: Negative.   HENT:  Negative.    Eyes: Negative.   Respiratory: Negative.    Cardiovascular: Negative.   Gastrointestinal: Negative.   Endocrine: Negative.   Genitourinary: Negative.    Musculoskeletal: Negative.   Skin: Negative.   Neurological: Negative.   Hematological: Negative.   Psychiatric/Behavioral: Negative.      Physical Exam:  weight is 119 lb 12.8 oz (54.3 kg). Her oral temperature is 98 F (36.7 C). Her blood pressure is 127/81 and her pulse is 108 (abnormal). Her oxygen saturation is 99%.   Wt Readings from Last 3 Encounters:  03/03/22 119 lb 12.8 oz (54.3 kg)  02/24/22 123 lb 1.3 oz (55.8 kg)  02/02/22 125 lb 1.3 oz (56.7 kg)    Physical Exam Vitals reviewed.  Constitutional:  Comments: On her breast exam, right breast is unremarkable.  Left breast shows the mass in the upper outer quadrant.  This is about 7 cm in size.  It is nontender not mobile.  There is some slight fullness in the left axilla.  She does have an area of erythema where the tumor is at the surface of the skin.  This probably measures about 5-6 mm.  HENT:     Head: Normocephalic and atraumatic.  Eyes:     Pupils: Pupils are equal, round, and reactive to light.  Cardiovascular:     Rate and Rhythm: Normal rate and regular rhythm.     Heart sounds: Normal heart sounds.  Pulmonary:     Effort: Pulmonary effort is normal.     Breath sounds: Normal breath sounds.   Abdominal:     General: Bowel sounds are normal.     Palpations: Abdomen is soft.  Musculoskeletal:        General: No tenderness or deformity. Normal range of motion.     Cervical back: Normal range of motion.  Lymphadenopathy:     Cervical: No cervical adenopathy.  Skin:    General: Skin is warm and dry.     Findings: No erythema or rash.  Neurological:     Mental Status: She is alert and oriented to person, place, and time.  Psychiatric:        Behavior: Behavior normal.        Thought Content: Thought content normal.        Judgment: Judgment normal.      Lab Results  Component Value Date   WBC 19.9 (H) 03/03/2022   HGB 10.0 (L) 03/03/2022   HCT 31.1 (L) 03/03/2022   MCV 85.4 03/03/2022   PLT 28 (L) 03/03/2022     Chemistry      Component Value Date/Time   NA 137 02/24/2022 1039   K 4.2 02/24/2022 1039   CL 103 02/24/2022 1039   CO2 26 02/24/2022 1039   BUN 15 02/24/2022 1039   CREATININE 0.89 02/24/2022 1039   CREATININE 0.62 08/20/2012 1139      Component Value Date/Time   CALCIUM 10.4 (H) 02/24/2022 1039   ALKPHOS 86 02/24/2022 1039   AST 106 (H) 02/24/2022 1039   ALT 100 (H) 02/24/2022 1039   BILITOT 0.9 02/24/2022 1039      Impression and Plan: Carla Clark is a very charming 37 year old premenopausal white female.  She has triple negative metastatic breast cancer.  She is BRCA positive.  Again, we really need to get going with treatment.  I realize that this is going to be quite tenuous given her low platelets.  We will have to treat her through the thrombocytopenia.  She clearly needs treatment.  I think we can monitor her progress by the mass in the left breast.  We will monitor her progress by the CA 27-29.  We can monitor her progress by the CA 27.29.  I just want to be as aggressive as possible.  I know that she has Crohn's disease.  I really think that immunotherapy is going to be helpful in this situation.  We will be checking her labs  weekly.  I will plan to see her back in probably 1 month or so, if not sooner if we have a problem.   Volanda Napoleon, MD 2/8/20248:26 AM

## 2022-03-03 NOTE — Patient Instructions (Addendum)
[place] [place]  Discharge Instructions: Thank you for choosing [facility] to provide your oncology and hematology care.   If you have a lab appointment with the [facility], please go directly to the [facility] and check in at the registration area.  Wear comfortable clothing and clothing appropriate for easy access to any Portacath or PICC line.   We strive to give you quality time with your provider. You may need to reschedule your appointment if you arrive late (15 or more minutes).  Arriving late affects you and other patients whose appointments are after yours.  Also, if you miss three or more appointments without notifying the office, you may be dismissed from the clinic at the provider's discretion.      For prescription refill requests, have your pharmacy contact our office and allow 72 hours for refills to be completed.    Today you received the following chemotherapy and/or immunotherapy agents Taxol, Keytruda      To help prevent nausea and vomiting after your treatment, we encourage you to take your nausea medication as directed.  1) At any time you can take Prochlorperazine (Compazine) every 6 hours AS NEEDED for Nausea.  2)  Beginning Saturday, 03/05/22, you can start taking Ondansetron (Zofran) every 8 hours AS NEEDED for nausea.    3) You can take these both at the same time.  Wait an hour after taking one and then take the other if needed.    BELOW ARE SYMPTOMS THAT SHOULD BE REPORTED IMMEDIATELY: *FEVER GREATER THAN 100.4 F (38 C) OR HIGHER *CHILLS OR SWEATING *NAUSEA AND VOMITING THAT IS NOT CONTROLLED WITH YOUR NAUSEA MEDICATION *UNUSUAL SHORTNESS OF BREATH *UNUSUAL BRUISING OR BLEEDING *URINARY PROBLEMS (pain or burning when urinating, or frequent urination) *BOWEL PROBLEMS (unusual diarrhea, constipation, pain near the anus) TENDERNESS IN MOUTH AND THROAT WITH OR WITHOUT PRESENCE OF ULCERS (sore throat, sores in  mouth, or a toothache) UNUSUAL RASH, SWELLING OR PAIN  UNUSUAL VAGINAL DISCHARGE OR ITCHING   Items with * indicate a potential emergency and should be followed up as soon as possible or go to the Emergency Department if any problems should occur.  Please show the CHEMOTHERAPY ALERT CARD or IMMUNOTHERAPY ALERT CARD at check-in to the Emergency Department and triage nurse. Should you have questions after your visit or need to cancel or reschedule your appointment, please contact [facility]  [phone] and follow the prompts.  Office hours are 8:00 a.m. to 4:30 p.m. Monday - Friday. Please note that voicemails left after 4:00 p.m. may not be returned until the following business day.  We are closed weekends and major holidays. You have access to a nurse at all times for urgent questions. Please call the main number to the clinic [phone] and follow the prompts.  For any non-urgent questions, you may also contact your provider using MyChart. We now offer e-Visits for anyone 5 and older to request care online for non-urgent symptoms. For details visit [email or web address].   Also download the MyChart app! Go to the app store, search "MyChart", open the app, select [facility], and log in with your MyChart username and password.

## 2022-03-03 NOTE — Progress Notes (Signed)
Keynote 355 ran paclitaxel over 60 minutes with the protocol. Please change to 60 minute infusion time on verification.

## 2022-03-04 ENCOUNTER — Encounter: Payer: Self-pay | Admitting: *Deleted

## 2022-03-04 NOTE — Progress Notes (Signed)
Called and spoke to patient after her first treatment yesterday. She is doing well this morning. She is at baseline and is experiencing no side effects.   We reviewed her prn medication and when to take. Also reviewed the chance of constipation with antiemetics and to start a bowel program early if she notices constipation. She is aware of the oncall service. Confirmed her next appointment.   She is encouraged to call with any questions or concerns.   Oncology Nurse Navigator Documentation     03/04/2022   10:00 AM  Oncology Nurse Navigator Flowsheets  Navigator Follow Up Date: 03/10/2022  Navigator Follow Up Reason: Follow-up Appointment;Chemotherapy  Production assistant, radio Encounter Type Telephone  Telephone Patient Update;Education;Outgoing Call  Patient Visit Type MedOnc  Treatment Phase Active Tx  Barriers/Navigation Needs Coordination of Care;Education  Education Pain/ Symptom Management;Other  Interventions Education;Psycho-Social Support  Acuity Level 2-Minimal Needs (1-2 Barriers Identified)  Education Method Verbal  Support Groups/Services Friends and Family  Time Spent with Patient 69

## 2022-03-05 LAB — T4: T4, Total: 7.2 ug/dL (ref 4.5–12.0)

## 2022-03-07 ENCOUNTER — Emergency Department (HOSPITAL_COMMUNITY): Payer: Commercial Managed Care - PPO

## 2022-03-07 ENCOUNTER — Other Ambulatory Visit: Payer: Self-pay | Admitting: *Deleted

## 2022-03-07 ENCOUNTER — Other Ambulatory Visit: Payer: Self-pay

## 2022-03-07 ENCOUNTER — Telehealth: Payer: Self-pay | Admitting: *Deleted

## 2022-03-07 ENCOUNTER — Inpatient Hospital Stay: Payer: Commercial Managed Care - PPO

## 2022-03-07 ENCOUNTER — Encounter (HOSPITAL_COMMUNITY): Payer: Self-pay | Admitting: *Deleted

## 2022-03-07 ENCOUNTER — Inpatient Hospital Stay (HOSPITAL_COMMUNITY)
Admission: EM | Admit: 2022-03-07 | Discharge: 2022-03-11 | DRG: 070 | Disposition: A | Discharge status: Home / Self Care | Payer: Commercial Managed Care - PPO | Attending: Internal Medicine | Admitting: Internal Medicine

## 2022-03-07 ENCOUNTER — Telehealth: Payer: Self-pay

## 2022-03-07 DIAGNOSIS — Z8041 Family history of malignant neoplasm of ovary: Secondary | ICD-10-CM

## 2022-03-07 DIAGNOSIS — D696 Thrombocytopenia, unspecified: Secondary | ICD-10-CM | POA: Diagnosis not present

## 2022-03-07 DIAGNOSIS — R040 Epistaxis: Secondary | ICD-10-CM | POA: Diagnosis present

## 2022-03-07 DIAGNOSIS — K5090 Crohn's disease, unspecified, without complications: Secondary | ICD-10-CM | POA: Diagnosis present

## 2022-03-07 DIAGNOSIS — Z1501 Genetic susceptibility to malignant neoplasm of breast: Secondary | ICD-10-CM

## 2022-03-07 DIAGNOSIS — C50919 Malignant neoplasm of unspecified site of unspecified female breast: Secondary | ICD-10-CM | POA: Diagnosis not present

## 2022-03-07 DIAGNOSIS — Z1152 Encounter for screening for COVID-19: Secondary | ICD-10-CM | POA: Diagnosis not present

## 2022-03-07 DIAGNOSIS — Z8249 Family history of ischemic heart disease and other diseases of the circulatory system: Secondary | ICD-10-CM

## 2022-03-07 DIAGNOSIS — C50912 Malignant neoplasm of unspecified site of left female breast: Secondary | ICD-10-CM | POA: Diagnosis present

## 2022-03-07 DIAGNOSIS — Z781 Physical restraint status: Secondary | ICD-10-CM

## 2022-03-07 DIAGNOSIS — R4701 Aphasia: Secondary | ICD-10-CM | POA: Diagnosis present

## 2022-03-07 DIAGNOSIS — Z79899 Other long term (current) drug therapy: Secondary | ICD-10-CM | POA: Diagnosis not present

## 2022-03-07 DIAGNOSIS — Z801 Family history of malignant neoplasm of trachea, bronchus and lung: Secondary | ICD-10-CM | POA: Diagnosis not present

## 2022-03-07 DIAGNOSIS — G9341 Metabolic encephalopathy: Secondary | ICD-10-CM | POA: Diagnosis present

## 2022-03-07 DIAGNOSIS — D849 Immunodeficiency, unspecified: Secondary | ICD-10-CM | POA: Diagnosis present

## 2022-03-07 DIAGNOSIS — C7951 Secondary malignant neoplasm of bone: Secondary | ICD-10-CM | POA: Diagnosis present

## 2022-03-07 DIAGNOSIS — T451X5A Adverse effect of antineoplastic and immunosuppressive drugs, initial encounter: Secondary | ICD-10-CM | POA: Diagnosis present

## 2022-03-07 DIAGNOSIS — I7774 Dissection of vertebral artery: Secondary | ICD-10-CM | POA: Diagnosis present

## 2022-03-07 DIAGNOSIS — Z808 Family history of malignant neoplasm of other organs or systems: Secondary | ICD-10-CM | POA: Diagnosis not present

## 2022-03-07 DIAGNOSIS — R29818 Other symptoms and signs involving the nervous system: Secondary | ICD-10-CM | POA: Diagnosis not present

## 2022-03-07 DIAGNOSIS — R4182 Altered mental status, unspecified: Secondary | ICD-10-CM | POA: Diagnosis not present

## 2022-03-07 DIAGNOSIS — Z91018 Allergy to other foods: Secondary | ICD-10-CM | POA: Diagnosis not present

## 2022-03-07 DIAGNOSIS — Z599 Problem related to housing and economic circumstances, unspecified: Secondary | ICD-10-CM

## 2022-03-07 DIAGNOSIS — K219 Gastro-esophageal reflux disease without esophagitis: Secondary | ICD-10-CM | POA: Diagnosis present

## 2022-03-07 DIAGNOSIS — N63 Unspecified lump in unspecified breast: Secondary | ICD-10-CM

## 2022-03-07 DIAGNOSIS — R945 Abnormal results of liver function studies: Secondary | ICD-10-CM | POA: Diagnosis not present

## 2022-03-07 DIAGNOSIS — Z171 Estrogen receptor negative status [ER-]: Secondary | ICD-10-CM | POA: Diagnosis not present

## 2022-03-07 DIAGNOSIS — R748 Abnormal levels of other serum enzymes: Secondary | ICD-10-CM | POA: Diagnosis present

## 2022-03-07 DIAGNOSIS — D61818 Other pancytopenia: Secondary | ICD-10-CM | POA: Diagnosis present

## 2022-03-07 DIAGNOSIS — Z9049 Acquired absence of other specified parts of digestive tract: Secondary | ICD-10-CM | POA: Diagnosis not present

## 2022-03-07 DIAGNOSIS — Z803 Family history of malignant neoplasm of breast: Secondary | ICD-10-CM

## 2022-03-07 DIAGNOSIS — Z800 Family history of malignant neoplasm of digestive organs: Secondary | ICD-10-CM | POA: Diagnosis not present

## 2022-03-07 DIAGNOSIS — C787 Secondary malignant neoplasm of liver and intrahepatic bile duct: Secondary | ICD-10-CM | POA: Diagnosis not present

## 2022-03-07 LAB — HEPATIC FUNCTION PANEL
ALT: 149 U/L — ABNORMAL HIGH (ref 0–44)
AST: 112 U/L — ABNORMAL HIGH (ref 15–41)
Albumin: 3.6 g/dL (ref 3.5–5.0)
Alkaline Phosphatase: 88 U/L (ref 38–126)
Bilirubin, Direct: 0.3 mg/dL — ABNORMAL HIGH (ref 0.0–0.2)
Indirect Bilirubin: 0.9 mg/dL (ref 0.3–0.9)
Total Bilirubin: 1.2 mg/dL (ref 0.3–1.2)
Total Protein: 6.9 g/dL (ref 6.5–8.1)

## 2022-03-07 LAB — CBC WITH DIFFERENTIAL/PLATELET
Abs Immature Granulocytes: 0.04 10*3/uL (ref 0.00–0.07)
Basophils Absolute: 0 10*3/uL (ref 0.0–0.1)
Basophils Relative: 0 %
Eosinophils Absolute: 0.1 10*3/uL (ref 0.0–0.5)
Eosinophils Relative: 1 %
HCT: 25.4 % — ABNORMAL LOW (ref 36.0–46.0)
Hemoglobin: 8.2 g/dL — ABNORMAL LOW (ref 12.0–15.0)
Immature Granulocytes: 1 %
Lymphocytes Relative: 84 %
Lymphs Abs: 5.1 10*3/uL — ABNORMAL HIGH (ref 0.7–4.0)
MCH: 27.4 pg (ref 26.0–34.0)
MCHC: 32.3 g/dL (ref 30.0–36.0)
MCV: 84.9 fL (ref 80.0–100.0)
Monocytes Absolute: 0.1 10*3/uL (ref 0.1–1.0)
Monocytes Relative: 1 %
Neutro Abs: 0.8 10*3/uL — ABNORMAL LOW (ref 1.7–7.7)
Neutrophils Relative %: 13 %
Platelets: 104 10*3/uL — ABNORMAL LOW (ref 150–400)
RBC: 2.99 MIL/uL — ABNORMAL LOW (ref 3.87–5.11)
RDW: 14.8 % (ref 11.5–15.5)
WBC: 6.1 10*3/uL (ref 4.0–10.5)
nRBC: 0.7 % — ABNORMAL HIGH (ref 0.0–0.2)

## 2022-03-07 LAB — SALICYLATE LEVEL: Salicylate Lvl: <7 mg/dL — ABNORMAL LOW (ref 7.0–30.0)

## 2022-03-07 LAB — LACTIC ACID, PLASMA
Lactic Acid, Venous: 1.2 mmol/L (ref 0.5–1.9)
Lactic Acid, Venous: 1.2 mmol/L (ref 0.5–1.9)

## 2022-03-07 LAB — HIV ANTIBODY (ROUTINE TESTING W REFLEX): HIV Screen 4th Generation wRfx: NONREACTIVE

## 2022-03-07 LAB — BASIC METABOLIC PANEL WITH GFR
Anion gap: 7 (ref 5–15)
BUN: 15 mg/dL (ref 6–20)
CO2: 23 mmol/L (ref 22–32)
Calcium: 8.9 mg/dL (ref 8.9–10.3)
Chloride: 107 mmol/L (ref 98–111)
Creatinine, Ser: 0.66 mg/dL (ref 0.44–1.00)
GFR, Estimated: >60 mL/min
Glucose, Bld: 96 mg/dL (ref 70–99)
Potassium: 4.5 mmol/L (ref 3.5–5.1)
Sodium: 137 mmol/L (ref 135–145)

## 2022-03-07 LAB — CBC
HCT: 18.5 % — ABNORMAL LOW (ref 36.0–46.0)
Hemoglobin: 5.9 g/dL — CL (ref 12.0–15.0)
MCH: 27.8 pg (ref 26.0–34.0)
MCHC: 31.9 g/dL (ref 30.0–36.0)
MCV: 87.3 fL (ref 80.0–100.0)
Platelets: 62 10*3/uL — ABNORMAL LOW (ref 150–400)
RBC: 2.12 MIL/uL — ABNORMAL LOW (ref 3.87–5.11)
RDW: 14.7 % (ref 11.5–15.5)
WBC: 4.2 10*3/uL (ref 4.0–10.5)
nRBC: 1 % — ABNORMAL HIGH (ref 0.0–0.2)

## 2022-03-07 LAB — RAPID URINE DRUG SCREEN, HOSP PERFORMED
Amphetamines: NOT DETECTED
Barbiturates: NOT DETECTED
Benzodiazepines: NOT DETECTED
Cocaine: NOT DETECTED
Opiates: NOT DETECTED
Tetrahydrocannabinol: NOT DETECTED

## 2022-03-07 LAB — URINALYSIS, ROUTINE W REFLEX MICROSCOPIC
Bilirubin Urine: NEGATIVE
Glucose, UA: NEGATIVE mg/dL
Hgb urine dipstick: NEGATIVE
Ketones, ur: NEGATIVE mg/dL
Leukocytes,Ua: NEGATIVE
Nitrite: NEGATIVE
Protein, ur: NEGATIVE mg/dL
Specific Gravity, Urine: 1.031 — ABNORMAL HIGH (ref 1.005–1.030)
pH: 7 (ref 5.0–8.0)

## 2022-03-07 LAB — LIPASE, BLOOD: Lipase: 69 U/L — ABNORMAL HIGH (ref 11–51)

## 2022-03-07 LAB — HCG, SERUM, QUALITATIVE: Preg, Serum: NEGATIVE

## 2022-03-07 LAB — GROUP A STREP BY PCR: Group A Strep by PCR: NOT DETECTED

## 2022-03-07 LAB — BLOOD GAS, VENOUS
Acid-Base Excess: 2.7 mmol/L — ABNORMAL HIGH (ref 0.0–2.0)
Bicarbonate: 26.2 mmol/L (ref 20.0–28.0)
O2 Saturation: 47.1 %
Patient temperature: 37
pCO2, Ven: 36 mmHg — ABNORMAL LOW (ref 44–60)
pH, Ven: 7.47 — ABNORMAL HIGH (ref 7.25–7.43)
pO2, Ven: <31 mmHg — CL (ref 32–45)

## 2022-03-07 LAB — ACETAMINOPHEN LEVEL: Acetaminophen (Tylenol), Serum: <10 ug/mL — ABNORMAL LOW (ref 10–30)

## 2022-03-07 LAB — HEMOGLOBIN AND HEMATOCRIT, BLOOD
HCT: 19.5 % — ABNORMAL LOW (ref 36.0–46.0)
Hemoglobin: 6.6 g/dL — CL (ref 12.0–15.0)

## 2022-03-07 LAB — RESP PANEL BY RT-PCR (RSV, FLU A&B, COVID)  RVPGX2
Influenza A by PCR: NEGATIVE
Influenza B by PCR: NEGATIVE
Resp Syncytial Virus by PCR: NEGATIVE
SARS Coronavirus 2 by RT PCR: NEGATIVE

## 2022-03-07 LAB — APTT: aPTT: 32 seconds (ref 24–36)

## 2022-03-07 LAB — OSMOLALITY: Osmolality: 289 mOsm/kg (ref 275–295)

## 2022-03-07 LAB — AMMONIA: Ammonia: 11 umol/L (ref 9–35)

## 2022-03-07 LAB — CBG MONITORING, ED: Glucose-Capillary: 93 mg/dL (ref 70–99)

## 2022-03-07 LAB — PROTIME-INR
INR: 1.3 — ABNORMAL HIGH (ref 0.8–1.2)
Prothrombin Time: 15.6 seconds — ABNORMAL HIGH (ref 11.4–15.2)

## 2022-03-07 LAB — ETHANOL: Alcohol, Ethyl (B): <10 mg/dL (ref <10)

## 2022-03-07 LAB — PREPARE RBC (CROSSMATCH)

## 2022-03-07 MED ORDER — POLYETHYLENE GLYCOL 3350 17 G PO PACK: 17.0000 g | PACK | Freq: Every day | ORAL | Status: DC | PRN

## 2022-03-07 MED ORDER — SODIUM CHLORIDE 0.9 % IV SOLN
2.0000 g | Freq: Once | INTRAVENOUS | Status: AC
  Administered 2022-03-07: 2 g via INTRAVENOUS
  Filled 2022-03-07: qty 2000

## 2022-03-07 MED ORDER — SODIUM CHLORIDE 0.9 % IV SOLN
2.0000 g | Freq: Three times a day (TID) | INTRAVENOUS | Status: DC
  Administered 2022-03-08: 2 g via INTRAVENOUS
  Filled 2022-03-07 (×4): qty 40

## 2022-03-07 MED ORDER — DEXTROSE 5 % IV SOLN
500.0000 mg | Freq: Three times a day (TID) | INTRAVENOUS | Status: DC
  Administered 2022-03-08: 500 mg via INTRAVENOUS
  Filled 2022-03-07 (×3): qty 10

## 2022-03-07 MED ORDER — DEXTROSE 5 % IV SOLN
500.0000 mg | Freq: Once | INTRAVENOUS | Status: AC
  Administered 2022-03-07: 500 mg via INTRAVENOUS
  Filled 2022-03-07: qty 10

## 2022-03-07 MED ORDER — ONDANSETRON HCL 4 MG/2ML IJ SOLN: 4.0000 mg | Freq: Four times a day (QID) | INTRAMUSCULAR | Status: DC | PRN

## 2022-03-07 MED ORDER — LORAZEPAM 2 MG/ML IJ SOLN
2.0000 mg | Freq: Once | INTRAMUSCULAR | Status: AC
  Administered 2022-03-07: 2 mg via INTRAVENOUS

## 2022-03-07 MED ORDER — ONDANSETRON HCL 4 MG PO TABS: 4.0000 mg | ORAL_TABLET | Freq: Four times a day (QID) | ORAL | Status: DC | PRN

## 2022-03-07 MED ORDER — GADOBUTROL 1 MMOL/ML IV SOLN
5.5000 mL | Freq: Once | INTRAVENOUS | Status: AC | PRN
  Administered 2022-03-07: 5.5 mL via INTRAVENOUS

## 2022-03-07 MED ORDER — DROPERIDOL 2.5 MG/ML IJ SOLN
1.2500 mg | Freq: Once | INTRAMUSCULAR | Status: AC
  Administered 2022-03-07: 1.25 mg via INTRAVENOUS
  Filled 2022-03-07: qty 2

## 2022-03-07 MED ORDER — MORPHINE SULFATE (PF) 2 MG/ML IV SOLN: 2.0000 mg | INTRAVENOUS | Status: DC | PRN

## 2022-03-07 MED ORDER — SODIUM CHLORIDE 0.9% FLUSH
3.0000 mL | Freq: Two times a day (BID) | INTRAVENOUS | Status: DC
  Administered 2022-03-07 – 2022-03-09 (×5): 3 mL via INTRAVENOUS

## 2022-03-07 MED ORDER — ACETAMINOPHEN 650 MG RE SUPP: 650.0000 mg | Freq: Four times a day (QID) | RECTAL | Status: DC | PRN

## 2022-03-07 MED ORDER — SODIUM CHLORIDE 0.9 % IV SOLN: INTRAVENOUS | Status: DC

## 2022-03-07 MED ORDER — SODIUM CHLORIDE 0.9 % IV SOLN
2.0000 g | Freq: Once | INTRAVENOUS | Status: AC
  Administered 2022-03-07: 2 g via INTRAVENOUS
  Filled 2022-03-07: qty 20

## 2022-03-07 MED ORDER — IOHEXOL 350 MG/ML SOLN
100.0000 mL | Freq: Once | INTRAVENOUS | Status: AC | PRN
  Administered 2022-03-07: 100 mL via INTRAVENOUS

## 2022-03-07 MED ORDER — LORAZEPAM 2 MG/ML IJ SOLN
INTRAMUSCULAR | Status: AC
  Filled 2022-03-07: qty 1

## 2022-03-07 MED ORDER — DOCUSATE SODIUM 100 MG PO CAPS
100.0000 mg | ORAL_CAPSULE | Freq: Two times a day (BID) | ORAL | Status: DC
  Administered 2022-03-08 – 2022-03-09 (×3): 100 mg via ORAL
  Filled 2022-03-07 (×4): qty 1

## 2022-03-07 MED ORDER — ACETAMINOPHEN 325 MG PO TABS
650.0000 mg | ORAL_TABLET | Freq: Four times a day (QID) | ORAL | Status: DC | PRN
  Administered 2022-03-09 – 2022-03-10 (×4): 650 mg via ORAL
  Filled 2022-03-07 (×5): qty 2

## 2022-03-07 MED ORDER — SODIUM CHLORIDE 0.9% IV SOLUTION: Freq: Once | INTRAVENOUS | Status: AC

## 2022-03-07 MED ORDER — HALOPERIDOL LACTATE 5 MG/ML IJ SOLN
5.0000 mg | Freq: Once | INTRAMUSCULAR | Status: AC
  Administered 2022-03-07: 5 mg via INTRAVENOUS
  Filled 2022-03-07: qty 1

## 2022-03-07 MED ORDER — VANCOMYCIN HCL IN DEXTROSE 1-5 GM/200ML-% IV SOLN
1000.0000 mg | INTRAVENOUS | Status: AC
  Administered 2022-03-07: 1000 mg via INTRAVENOUS
  Filled 2022-03-07: qty 200

## 2022-03-07 MED ORDER — LACTATED RINGERS IV BOLUS
1000.0000 mL | Freq: Once | INTRAVENOUS | Status: AC
  Administered 2022-03-07: 1000 mL via INTRAVENOUS

## 2022-03-07 MED ORDER — SODIUM CHLORIDE 0.9 % IV BOLUS
1000.0000 mL | Freq: Once | INTRAVENOUS | Status: AC
  Administered 2022-03-07: 1000 mL via INTRAVENOUS

## 2022-03-07 MED ORDER — ACETAMINOPHEN 650 MG RE SUPP: 650.0000 mg | Freq: Once | RECTAL | Status: AC

## 2022-03-07 MED ORDER — VANCOMYCIN HCL 500 MG/100ML IV SOLN
500.0000 mg | Freq: Three times a day (TID) | INTRAVENOUS | Status: DC
  Administered 2022-03-08: 500 mg via INTRAVENOUS
  Filled 2022-03-07 (×3): qty 100

## 2022-03-07 MED ORDER — BISACODYL 5 MG PO TBEC: 5.0000 mg | DELAYED_RELEASE_TABLET | Freq: Every day | ORAL | Status: DC | PRN

## 2022-03-07 MED ORDER — LORAZEPAM 2 MG/ML IJ SOLN
2.0000 mg | Freq: Once | INTRAMUSCULAR | Status: AC
  Administered 2022-03-07: 2 mg via INTRAVENOUS
  Filled 2022-03-07: qty 1

## 2022-03-07 MED ORDER — HYDRALAZINE HCL 20 MG/ML IJ SOLN: 5.0000 mg | INTRAMUSCULAR | Status: DC | PRN

## 2022-03-07 MED ORDER — FENTANYL CITRATE PF 50 MCG/ML IJ SOSY
100.0000 ug | PREFILLED_SYRINGE | Freq: Once | INTRAMUSCULAR | Status: AC
  Administered 2022-03-07: 100 ug via INTRAVENOUS
  Filled 2022-03-07: qty 2

## 2022-03-07 MED ORDER — OXYCODONE HCL 5 MG PO TABS: 5.0000 mg | ORAL_TABLET | ORAL | Status: DC | PRN

## 2022-03-07 MED ORDER — ACETAMINOPHEN 650 MG RE SUPP
RECTAL | Status: AC
  Administered 2022-03-07: 650 mg via RECTAL
  Filled 2022-03-07: qty 1

## 2022-03-07 MED ORDER — MIDAZOLAM HCL 2 MG/2ML IJ SOLN
2.0000 mg | Freq: Once | INTRAMUSCULAR | Status: AC
  Administered 2022-03-07: 2 mg via INTRAVENOUS
  Filled 2022-03-07: qty 2

## 2022-03-07 NOTE — ED Notes (Addendum)
[facility] called for transport to [facility]

## 2022-03-07 NOTE — ED Notes (Signed)
Code stroke called

## 2022-03-07 NOTE — Progress Notes (Signed)
Pharmacy Antibiotic Note  Carla Clark is a 37 y.o. female admitted on 03/07/2022 with meningitis.  Pharmacy has been consulted for Vanco, Acyclovir dosing.  Active Problem(s): More AMS. Aphasia. Code Stroke [id number] (03/07/22) - went to [facility] last night and received Plts and CT. 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   ID: Empiric abx for meningitis. Likely unable to do LP due to thrombocytopenia. - Temp 100.6. WBC 6.1, Scr <1  Amp 2/12 Rocephin 2/12 Vanco 2/12>> Acyclovir 2/12>>  Plan: Rocephin 2g IV x 1 so far in ED Amp. 2 mg IV x 1 so far in ED  Acyclovir 88m/kg IV q8hr IVF NS 126mhr ok with Dr. ZaRoderic Palauor Acyclovir protocol. Scr daily  Vanco 1g IV x 1 in ED then 50021mV q8hr.  Target Vancomycin trough 15-20 at steady state.   Weight: 54.3 kg (119 lb 12.8 oz)  Temp (24hrs), Avg:99.7 F (37.6 C), Min:98.7 F (37.1 C), Max:100.6 F (38.1 C)  Recent Labs  Lab 03/03/22 0810 03/07/22 1133 03/07/22 1135 03/07/22 1630  WBC 19.9* 6.1  --   --   CREATININE 0.82 0.66  --   --   LATICACIDVEN  --   --  1.2 1.2    Estimated Creatinine Clearance: 82 mL/min (by C-G formula based on SCr of 0.66 mg/dL).    Allergies  Allergen Reactions   Other Other (See Comments)    History of Crohn's disease- NO seeds, tomatoes, anything that doesn't digest well, etc.....   Gregg Holster S. RobAlford HighlandharmD, BCPS Clinical Staff Pharmacist [email or web address]  RobWayland Salinas12/2024 5:24 PM

## 2022-03-07 NOTE — ED Notes (Signed)
To CT via stretcher with RN, on monitor. Husband remains in ED exam room.

## 2022-03-07 NOTE — ED Notes (Signed)
Movement in MRI during sequence/scan, raising hands.

## 2022-03-07 NOTE — ED Triage Notes (Signed)
Pt brought in by family due to AMS, pt has Stage 4 Breast Cancer. She went to [facility] last night and received Plts and CT. Today more altered.

## 2022-03-07 NOTE — ED Notes (Signed)
Pt has left via [facility]

## 2022-03-07 NOTE — Telephone Encounter (Signed)
Received a call from [name] at the [facility]. [name] wanted to clarify maintenance RX for [name]. I informed her that based on last office note patient is supposed to take Skyrizi 360 mg every 8 weeks for her maintenance dose. [name] verbalized understanding and had no concerns at the end of the call.

## 2022-03-07 NOTE — ED Notes (Signed)
CT beginning, alert, NAD, calm, cooperative

## 2022-03-07 NOTE — ED Notes (Signed)
EDP Dr. Pearline Cables into room, at [facility].

## 2022-03-07 NOTE — Consult Note (Addendum)
NEUROLOGY TELECONSULTATION NOTE   Date of service: March 07, 2022 Patient Name: Carla Clark MRN:  [id number] DOB:  [birth year] Reason for consult: telestroke  Requesting Provider: Dr. Wynona Dove Consult Participants: myself, patient, bedside RN, telestroke RN Location of the provider: [facility] Location of the patient: [facility]  This consult was provided via telemedicine with 2-way video and audio communication. The patient/family was informed that care would be provided in this way and agreed to receive care in this manner.   _ _ _   _ __   _ __ _ _  __ __   _ __   __ _  History of Present Illness   This is a 37 year old woman with a past medical history significant for breast cancer metastasized to multiple sites including bone but not brain on last scan, anemia, Crohn's disease, thrombocytopenia complicated by recurrent severe epistaxis most recently seen last night at [facility] and required platelet transfusion, who presents with aphasia and altered mental status.  LKW was 9:00 AM and she went to take a nap and she woke up with symptoms at 10:45AM.  She was very confused and tried to urinate in the kitchen.  At that time she was said to have left-sided weakness although this was not evident on my examination.    CT head NAICP ASPECTS 10. TNK not administered 2/2 chronic thrombocytopenia c/b refractory epistaxis requiring platelet transfusion in the past 24 hrs. 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 No reported hx trauma. CNS imaging was personally reviewed and discussed with radiology by phone. Given that her sx were primarily altered mental status without localizing symptoms, and given her hx metastatic cancer, 21m IV ativan was administered given c/f possible seizure but her mental status did not improve after administration.   She has a history of stage IV metastatic breast cancer which  was initially diagnosed in November and was found to have metastasized to her bones in December.  Her last scans were 3 weeks ago and she had no brain mets identified at that time.  She has chronic thrombocytopenia with recurrent epistaxis.  [facility] which was unable to be stopped and she was given a platelet transfusion for platelets of 16 at that time.   ROS   UTA 2/2 AMS  Past History   The following was personally reviewed:  Past Medical History:  Diagnosis Date   Anal fissure    Anemia 11/2011   iron deficiency. 01/2013 parenteral iron. Intranasal B12.  Dr EMarin Olpfollows.    B12 deficiency 11/2012   Breast cancer ([facility]    Breast cancer metastas[place] to multiple sites, [place]   Breast mass in female 04/21/2012   Cervical cancer screening 01/27/2012   Chicken pox as a child   Crohn's disease ([place] 2013   ileitis 10/2011. ileitis, mesenteric abscess, ? entero-enteric fistula on 08/2012 CT enterography   Dermatitis 08/22/2016   GERD (gastroesophageal reflux disease)    Hepatitis B immune 07/2012   previous vaccination.    Hives 09/2014   per Dermatologist.  Rx Zyrtec, Benadryl.    Ileitis    Serrated polyp of colon    Small bowel obstruction (HCC)    SVD (spontaneous vaginal delivery) 08/13/2018   Vitamin D deficiency 2014   Past Surgical History:  Procedure Laterality Date  LAPAROSCOPIC RIGHT HEMI COLECTOMY  07/11/2019   WISDOM TOOTH EXTRACTION  37 yrs old   Family History  Problem Relation Age of Onset   Nephrolithiasis Father    Brain cancer Father    Other Father        anaplastic astrocytoma   Ovarian cancer Paternal Aunt        ovarian   Breast cancer Paternal Aunt    Cancer Paternal Aunt        breast (2019) and ovarian cancer (2005)   Other Paternal Uncle 82       cyst removed from pancreas   Pancreatic cancer Paternal Uncle        Stage 1   Cancer Paternal Uncle        pancreatic    Lung cancer Maternal Grandmother    Gout Maternal Grandfather    Heart attack Maternal Grandfather    Ovarian cancer Paternal Grandmother    Cancer Paternal Grandmother 83       ovarian   Pancreatic cancer Paternal Grandfather    Colon cancer Neg Hx    Stomach cancer Neg Hx    Esophageal cancer Neg Hx    Social History   Socioeconomic History   Marital status: Married    Spouse name: Not on file   Number of children: 2   Years of education: Not on file   Highest education level: Not on file  Occupational History   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  Tobacco Use   Smoking status: Never   Smokeless tobacco: Never   Tobacco comments:    never used tobacco  Vaping Use   Vaping Use: Never used  Substance and Sexual Activity   Alcohol use: Not Currently    Alcohol/week: 0.0 standard drinks of alcohol    Comment: occasionaly   Drug use: No   Sexual activity: Yes    Partners: Male    Birth control/protection: None  Other Topics Concern   Not on file  Social History Narrative   Not on file   Social Determinants of Health   Financial Resource Strain: Medium Risk (01/13/2022)   Overall Financial Resource Strain (CARDIA)    Difficulty of Paying Living Expenses: Somewhat hard  Food Insecurity: No Food Insecurity (01/13/2022)   Hunger Vital Sign    Worried About Running Out of Food in the Last Year: Never true    Ran Out of Food in the Last Year: Never true  Transportation Needs: No Transportation Needs (01/13/2022)   PRAPARE - Hydrologist (Medical): No    Lack of Transportation (Non-Medical): No  Physical Activity: Not on file  Stress: Not on file  Social Connections: Not on file   No Known Allergies  Medications   (Not in a hospital admission)    No current facility-administered medications for this encounter.  Current Outpatient Medications:    hydrocortisone (ANUSOL-HC) 2.5 % rectal cream, Place 1 Application  rectally 2 (two) times daily., Disp: 28 g, Rfl: 1   lidocaine-prilocaine (EMLA) cream, Apply 1 Application topically. Apply a dime size of cream to port-a-cath 1-2 hours prior to access. Cover with [employer]., Disp: , Rfl:    ondansetron (ZOFRAN) 8 MG tablet, Take 1 tablet (8 mg total) by mouth every 8 (eight) hours as needed for nausea or vomiting., Disp: 30 tablet, Rfl: 1   prochlorperazine (COMPAZINE) 10 MG tablet, Take 1 tablet (10 mg total) by mouth every 6 (six) hours  as needed for nausea or vomiting., Disp: 30 tablet, Rfl: 1   pyridoxine (NEURO-K-250 VITAMIN B6) 250 MG tablet, Take 2 tablets (500 mg total) by mouth daily., Disp: 60 tablet, Rfl: 6   Risankizumab-rzaa (SKYRIZI [place]), Inject into the skin., Disp: , Rfl:    vitamin B-12 (CYANOCOBALAMIN) 100 MCG tablet, Take 100 mcg by mouth daily., Disp: , Rfl:   Vitals   Vitals:   03/07/22 1146 03/07/22 1150 03/07/22 1200 03/07/22 1210  BP:    126/80  Pulse:  (!) 109 (!) 121 (!) 111  Resp:  20 (!) 21 15  Temp:      TempSrc:      SpO2:  99% 100% 98%  Weight: 54.3 kg        Body mass index is 21.22 kg/m.  Physical Exam   Exam performed over telemedicine with 2-way video and audio communication and with assistance of bedside RN  Physical Exam Gen: oriented to self only, unable to answer other orientation questions, perseverating on numbers 6,7,8  Resp: normal WOB CV: extremities appear well-perfused  Neuro *MS: oriented to self and month only, unable to answer other orientation questions, perseverating on numbers 6,7,8, unable to follow commands *Speech: no dysarthria, severe global aphasia *CN: PERRL, EOMI, blinks to threat bilat, optic discs unable to be visualized 2/2 pupillary constriction, sensation intact, face symmetric at rest, hearing intact to voice *Motor:   Normal bulk.  No tremor, rigidity or bradykinesia. Drift but not to bed LUE, drift to bed in all other extremities, difficult to assess 2/2 inability to follow  commands *Sensory: SILT *Coordination:  UTA *Reflexes:  UTA 2/2 tele-exam *Gait: deferred  NIHSS  1a Level of Conscious.: 0 1b LOC Questions: 1 1c LOC Commands: 2 2 Best Gaze: 0 3 Visual: 0 4 Facial Palsy: 0 5a Motor Arm - left: 1 5b Motor Arm - Right: 2 6a Motor Leg - Left: 2 6b Motor Leg - Right: 2 7 Limb Ataxia: 0 8 Sensory: 0 9 Best Language: 2 10 Dysarthria: 0 11 Extinct. and Inatten.: 0  TOTAL: 12   Premorbid mRS = 0   Labs   CBC:  Recent Labs  Lab 03/03/22 0810 03/07/22 1133  WBC 19.9* 6.1  NEUTROABS 3.3 0.8*  HGB 10.0* 8.2*  HCT 31.1* 25.4*  MCV 85.4 84.9  PLT 28* 104*    Basic Metabolic Panel:  Lab Results  Component Value Date   NA 137 03/07/2022   K 4.5 03/07/2022   CO2 23 03/07/2022   GLUCOSE 96 03/07/2022   BUN 15 03/07/2022   CREATININE 0.66 03/07/2022   CALCIUM 8.9 03/07/2022   GFRNONAA >60 03/07/2022   GFRAA >60 04/18/2015   Lipid Panel:  Lab Results  Component Value Date   LDLCALC 52 05/04/2021   HgbA1c:  Lab Results  Component Value Date   HGBA1C 5.2 09/16/2019   Urine Drug Screen: No results found for: "LABOPIA", "COCAINSCRNUR", "LABBENZ", "AMPHETMU", "THCU", "LABBARB"  Alcohol Level     Component Value Date/Time   ETH <10 03/07/2022 1134     Impression   This is a 37 yo patient with hx stage IV breast cancer with mets to bone and liver, thrombocytopenia c/b recurrent refractory epistaxis who presents with aphasia and AMS. LKW 0900. CT head NAICP ASPECTS 10. TNK not administered 2/2 chronic thrombocytopenia c/b refractory epistaxis requiring platelet transfusion in the past 24 hrs. 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 No reported hx trauma. Presenting symptoms are not explained by vertebral dissection. 53m IV ativan administered 2/2 c/f possible seizure but mental status did not improve. Ddx includes brain  met, ischemic stroke, seizure, infection, or toxic/metabolic encephalopathy.  Recommendations   - MRI brain wwo - MRA neck wwo w/ T1 fat sats to clarify dissection findings - Metabolic/infectious workup per ED. If MRI and ED workup unrevealing patient may need empiric CNS coverage and LP if platelet count allows (immunosuppressed on chemo) - Will provide further guidance to ED when MRI results are back ______________________________________________________________________   Thank you for the opportunity to take part in the care of this patient. If you have any further questions, please contact the neurology consultation attending.  Signed,  CSu Monks MD [facility] [phone] If 7pm- 7am, please page neurology on call as listed in [place]  **Any copied and pasted documentation in this note was written by me in another application not billed for and pasted by me into this document.  Addendum [id number]:   MRI brain personally reviewed and discussed with radiology. Images are motion-degraded but do not show any clear parenchymal metastases or areas of ischemia. There is dural thickening on the right with susceptibility imaging consistent with resolving small subdural. Dural mets are felt to be less likely. UA unremarkable. Given her immunosuppression on chemo and severely altered mental status I recommended LP in ED for infectious labs as well as cytology (leptomeningeal carcinomatosis is on the differential). LP was attempted but not successful. She has been started on empiric CNS coverage with vanc/cefepime/acyclovir. Given her AMS cefepime would not typically be my first choice however she is immunosuppressed and if infected it would be considered potentially healthcare-associated given her ongoing chemo therefore cefepime is the most appropriate option. [facility] tomorrow for infectious workup and cytology. I am also  going to recommend cEEG to r/o nonconvulsive seizure activity.  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 It is unclear if this is acute or chronic, and regardless does not explain her symptoms. There is no stroke on MRI. Typically she would be started on antiplatelets for vertebral dissection but she has severe chronic thrombocytopenia with recurrent severe epistaxis requiring ED visits[facility] platelet count of 16 and required platelet transfusion. The risk of hemorrhage with antiplatelets at this time is felt to significantly outweigh any potential benefit with respect to this dissection of unclear chronicity in the absence of ischemic stroke. Of note, the dissection is an incidental finding.  Recommendations: - Continue empiric CNS coverage with vanc, cefepime, acyclovir, ampicillin - [facility] glucose, protein, meningitis/encephalitis PCR panel, gram stain, culture, cytology - cEEG - Hold DVT prophylaxis in anticipation of LP, consider restarting afterwards if platelets allow - Check coags and CBC in AM prior to LP. She has severe chronic thrombocytopenia but her platelets were 104 today [facility] - Repeat MRI brain with and without contrast when patient is able to tolerate without moving - No antiplatelets recommended for dissection at this time; see above discussion - [facility]  CSu Monks MD [facility] [phone] If [place] please page neurology on call  as listed in [place].

## 2022-03-07 NOTE — ED Notes (Signed)
MRI beginning

## 2022-03-07 NOTE — ED Notes (Signed)
MRI stopped early d/t motion, restlessness.

## 2022-03-07 NOTE — Progress Notes (Signed)
Dr. Pearline Cables approved topogram of abdomen/pelvis without pregnancy test

## 2022-03-07 NOTE — Consult Note (Signed)
NAME:  Carla Clark, MRN:  [id number], DOB:  [birth year], LOS: 0 ADMISSION DATE:  03/07/2022 CONSULTATION DATE:  03/07/2022 REFERRING MD:  Lorin Mercy TRH CHIEF COMPLAINT:  AMS   History of Present Illness:  37 year old woman who presented to [facility] 03/07/2022 for AMS. PMHx significant for Crohn's disease c/b ileitis (s/p R hemicolectomy 2021), iron deficiency and pernicious anemia, stage IV breast CA with metastasis to bone (triple negative BRCA+, on chemotherapy, followed by Dr. Marin Olp), GERD.  Patient initially presented to [facility] 2/11PM for intractable epistaxis and received Plt (Plt 14 at OSH). CT Head was completed and reportedly unremarkable. She arrived home ~0900 2/12 and was at her baseline, per family. Around 1045, patient woke confused and unable to speak at her baseline with pronounced word-finding difficulty and left-sided weakness; she was laying on the floor at home and there was question of a possible ground-level fall. Code Stroke was activated.  On ED arrival, patient was febrile to 100.28F, tachycardic to 123, RR 21, BP 104/94, SpO2 100% on RA. GCS 15. Labs were notable for WBC 6.1, Hgb 8.2, Plt 104 (s/p transfusion at OSH), BMP WNL, HFP with elevated transaminases (AST 112, ALT 149), INR 1.3, ammonia WNL. Lipase slightly elevated 69. LA 1.2. EtOH, UDS, salicylates, acetaminophen levels negative. COVID/Flu/RSV negative. GAS negative (recent exposure to son with strep). UA unremarkable. Blood Cx were collected. CT Head NAICA; no TNK given due to thrombocytopenia. CTA Head/Neck with c/f left V2 vertebral artery dissection, extensive osseous metastatic disease. MRI Brain negative for stroke, ?meningeal enhancement. Broad-spectrum empiric antibiotics were initiated.  Neuro was consulted with c/f meningitis encephalitis versus leptomeningeal carcinomatosis; recommended transfer to [facility] for further evaluation and management including LP and EEG.  PCCM consulted.  Pertinent Medical History:    Past Medical History:  Diagnosis Date   Anal fissure    Anemia 11/2011   iron deficiency. 01/2013 parenteral iron. Intranasal B12.  Dr Marin Olp follows.    B12 deficiency 11/2012   Breast cancer [facility])    Breast cancer metastasized to multiple sites, left ([place]) 02/02/2022   Breast mass in female 04/21/2012   Cervical cancer screening 01/27/2012   Chicken pox as a child   Crohn's disease ([place]) 2013   ileitis 10/2011. ileitis, mesenteric abscess, ? entero-enteric fistula on 08/2012 CT enterography   Dermatitis 08/22/2016   GERD (gastroesophageal reflux disease)    Hepatitis B immune 07/2012   previous vaccination.    Hives 09/2014   per Dermatologist.  Rx Zyrtec, Benadryl.    Ileitis    Serrated polyp of colon    Small bowel obstruction (HCC)    SVD (spontaneous vaginal delivery) 08/13/2018   Vitamin D deficiency 2014   Significant Hospital Events: Including procedures, antibiotic start and stop dates in addition to other pertinent events   2/11PM - Presented to [facility] for intractable epistaxis. Plt count 14. Plt x 1 given, CT Head completed (negative). 2/12 - Presented to [facility] after waking from a nap altered around 1045. LKN [id number]. Aphasic with word finding difficulty, relative L-sided weakness, confusion/AMS. Code Stroke called. CT negative. MRI with ?meningeal enhancement. Ceftriaxone/Ampicillin/Acyclovir initiated. Neuro consulted with rec for EEG/LP and transfer to [facility]. PCCM consulted.  Interim History / Subjective:  PCCM consulted for assistance with management. Plan for LP attempt at bedside.  Objective:  Blood pressure 101/64, pulse (!) 109, temperature 99.8 F (37.7 C), temperature source Oral, resp. rate (!) 22, weight 54.3 kg, SpO2 100 %, currently breastfeeding.  FiO2 (%):  [98 %-100 %] 100 %   Intake/Output Summary (Last 24 hours) at 03/07/2022 2117 Last data filed at 03/07/2022 1839 Gross per 24 hour  Intake 2400 ml  Output --  Net 2400 ml    Filed Weights   03/07/22 1146  Weight: 54.3 kg   Physical Examination: General: Acutely ill-appearing young woman in NAD. Drowsy. HEENT: [place]/AT, anicteric sclera, PERRL 79m, dry mucous membranes. Dried blood to inner nares, lips. Neuro:  Drowsy, lethargic post-anxiolytic administration.  Responds to verbal stimuli. Following commands intermittently. Moves all 4 extremities spontaneously. Generalized weakness. CV: RRR, no m/g/r. PULM: Breathing even and unlabored on 2LNC. Lung fields CTAB. GI: Soft, nontender, nondistended. Slightly hypoactive bowel sounds. Extremities: No LE edema noted. L axilla fullness, adjacent to L breast mass. Skin: Warm/dry, no rashes.  Resolved Hospital Problem List:    Assessment & Plan:  Altered mental status with probable acute septic encephalopathy - Admit to 4NP for further neurologic workup - Neurology consulted, following - LTM EEG - Bedside LP completed by CCM (Dr. CTacy Learn - F/u CSF studies - Continue empiric broad-spectrum antibiotics (ceftriaxone/ampicillin/acyclovir) - F/u Bcx/CSF Cx, narrow abx as able - Repeat brain imaging per Neuro (MRI Brain with/without)  Anemia, multifactorial - current anemic state likely 2/2 chemotherapy History of IDA, pernicious anemia Severe thrombocytopenia - Trend H&H - Monitor for signs of active bleeding - Transfuse for Hgb < 7.0, Plt  < 20K or hemodynamically significant bleeding - Hematology/Oncology consult 2/13AM prior to giving any marrow-stimulating agents (i.e. Nplate)  Stage IV breast CA with metastasis to bone L-sided breast mass. Triple negative, BRCA+. Followed by Dr. EMarin Olpas an outpatient, new chemo start 2/8. Has port in place. - Oncology consult 2/13AM  Crohn's disease Recent Skyrizi start ~Thanksigiving. Has had some joint pain, otherwise tolerating well. - Resume home medications when clinically appropriate - Antiemetics PRN  Best Practice: (right click and "Reselect all SmartList  Selections" daily)   Diet/type: NPO DVT prophylaxis: SCDs, hold SQH until Plt  > 50 GI prophylaxis: N/A Lines: N/A Foley:  N/A Code Status:  full code Last date of multidisciplinary goals of care discussion [Per Primary Team]  Labs:  CBC: Recent Labs  Lab 03/03/22 0810 03/07/22 1133 03/07/22 2000  WBC 19.9* 6.1 4.2  NEUTROABS 3.3 0.8*  --   HGB 10.0* 8.2* 5.9*  HCT 31.1* 25.4* 18.5*  MCV 85.4 84.9 87.3  PLT 28* 104* 62*   Basic Metabolic Panel: Recent Labs  Lab 03/03/22 0810 03/07/22 1133  NA 135 137  K 4.0 4.5  CL 102 107  CO2 23 23  GLUCOSE 121* 96  BUN 21* 15  CREATININE 0.82 0.66  CALCIUM 10.2 8.9   GFR: Estimated Creatinine Clearance: 82 mL/min (by C-G formula based on SCr of 0.66 mg/dL). Recent Labs  Lab 03/03/22 0810 03/07/22 1133 03/07/22 1135 03/07/22 1630 03/07/22 2000  WBC 19.9* 6.1  --   --  4.2  LATICACIDVEN  --   --  1.2 1.2  --    Liver Function Tests: Recent Labs  Lab 03/03/22 0810 03/07/22 1133  AST 200* 112*  ALT 194* 149*  ALKPHOS 109 88  BILITOT 1.1 1.2  PROT 7.7 6.9  ALBUMIN 4.0 3.6   Recent Labs  Lab 03/07/22 1133  LIPASE 69*   Recent Labs  Lab 03/07/22 1134  AMMONIA 11   ABG:    Component Value Date/Time   HCO3 26.2 03/07/2022 1134   O2SAT 47.1 03/07/2022 1134  Coagulation Profile: Recent Labs  Lab 03/07/22 1133  INR 1.3*   Cardiac Enzymes: No results for input(s): "CKTOTAL", "CKMB", "CKMBINDEX", "TROPONINI" in the last 168 hours.  HbA1C: Hgb A1c MFr Bld  Date/Time Value Ref Range Status  09/16/2019 04:28 PM 5.2 4.6 - 6.5 % Final    Comment:    Glycemic Control Guidelines for People with Diabetes:Non Diabetic:  <6%Goal of Therapy: <7%Additional Action Suggested:  >8%    CBG: Recent Labs  Lab 03/07/22 1151  GLUCAP 93   Review of Systems:   Patient is encephalopathic and/or intubated. Therefore history has been obtained from chart review.   Past Medical History:  She,  has a past medical  history of Anal fissure, Anemia (11/2011), B12 deficiency (11/2012), Breast cancer ([place]), Breast cancer metastasized to multiple sites, left ([place]) (02/02/2022), Breast mass in female (04/21/2012), Cervical cancer screening (01/27/2012), Chicken pox (as a child), Crohn's disease ([place]) (2013), Dermatitis (08/22/2016), GERD (gastroesophageal reflux disease), Hepatitis B immune (07/2012), Hives (09/2014), Ileitis, Serrated polyp of colon, Small bowel obstruction ([place]), SVD (spontaneous vaginal delivery) (08/13/2018), and Vitamin D deficiency (2014).   Surgical History:   Past Surgical History:  Procedure Laterality Date   LAPAROSCOPIC RIGHT HEMI COLECTOMY  07/11/2019   WISDOM TOOTH EXTRACTION  37 yrs old     Social History:   reports that she has never smoked. She has never used smokeless tobacco. She reports that she does not currently use alcohol. She reports that she does not use drugs.   Family History:  Her family history includes Brain cancer in her father; Breast cancer in her paternal aunt; Cancer in her paternal aunt and paternal uncle; Cancer (age of onset: 70) in her paternal grandmother; Gout in her maternal grandfather; Heart attack in her maternal grandfather; Lung cancer in her maternal grandmother; Nephrolithiasis in her father; Other in her father; Other (age of onset: 2) in her paternal uncle; Ovarian cancer in her paternal aunt and paternal grandmother; Pancreatic cancer in her paternal grandfather and paternal uncle. There is no history of Colon cancer, Stomach cancer, or Esophageal cancer.   Allergies: Allergies  Allergen Reactions   Other Other (See Comments)    History of Crohn's disease- NO seeds, tomatoes, anything that doesn't digest well, etc.....   Home Medications: Prior to Admission medications   Medication Sig Start Date End Date Taking? Authorizing Provider  hydrocortisone (ANUSOL-HC) 2.5 % rectal cream Place 1 Application rectally 2 (two) times daily. Patient  taking differently: Place 1 Application rectally 2 (two) times daily as needed for hemorrhoids. 11/10/21  Yes Pyrtle, Lajuan Lines, MD  lidocaine-prilocaine (EMLA) cream Apply 1 Application topically See admin instructions. Apply a dime-size of cream to port-a-cath 1-2 hours prior to access. Cover with [employer]. 02/14/22  Yes [provider]  ondansetron (ZOFRAN) 8 MG tablet Take 1 tablet (8 mg total) by mouth every 8 (eight) hours as needed for nausea or vomiting. 03/01/22  Yes Volanda Napoleon, MD  prochlorperazine (COMPAZINE) 10 MG tablet Take 1 tablet (10 mg total) by mouth every 6 (six) hours as needed for nausea or vomiting. 03/01/22  Yes Volanda Napoleon, MD  SKYRIZI 600 MG/10ML injection Inject 600 mg into the vein once.   Yes [provider]  traMADol (ULTRAM) 50 MG tablet Take 50 mg by mouth every 6 (six) hours as needed (for pain).   Yes [provider]  pyridoxine (NEURO-K-250 VITAMIN B6) 250 MG tablet Take 2 tablets (500 mg total) by mouth daily. 02/24/22  Volanda Napoleon, MD  vitamin B-12 (CYANOCOBALAMIN) 100 MCG tablet Take 100 mcg by mouth daily.    [provider]   Critical care time: N/A   Rhae Lerner [facility] 03/07/22 9:17 PM  Please see [email or web address] for pager details.  From 7A-7P if no response, please call [phone] After hours, please call ELink [phone]

## 2022-03-07 NOTE — Progress Notes (Incomplete)
Brief Neuro Update note:  Briefly, [name] is a 37 y.o. female with aggressive stage 4 breast cancer with no known brain mets recently started chemo with [place] and paclitaxel who presented with confusion and aphasia. Took a nap at 0900 in AM and woke up with confusion, going to kitchen instead of bathroom to urinate with pronounced word finding difficulty and ?left sided weakness. She was on the floor. She was brought in to the ED where a code stroke was activated. Patient noted to perseverate and kept repeating a string of numbers. Tnkase not offered 2/2 low platelet count. Not a candidate for thrombectomy 2/2 no LVO. Ativan 37m trial with no change in aphasia.  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 MRI Brain w + w/o C motion degraded and R convexity subdural colletion most consistent with late subacute/chronic SDH but unable to rule out dural tumor 2/2 motion.  LP in the ED at the bedside after platelet trausfusion was unsuccessful. She was started on empiric meningitis/encephalitis coverage [facility].  Husband reports that since evaluation by Dr. SLivia Snellen patient aphasia worsened and she did not speak for few hours. [facility] she was able to coherently say few words and despite not speaking much, any noted speech was more appropriate to situation.  Exam: I briefly evaluated her at bedside. She is sleepy after getting ativan for LP. She partially opens eyes, makes brief eye contact and nods. She requires a lot of stimulation to keep her eyes open. She does not follow commands. She spontaneously moves all extremities pretty equally and does localize to pinch in all extremities.  No seizure like activity noted.  Plan: - appreciate PCCM teams help with LP. - ordered CSF cytology and lab staff notified over  phone to add to collected CSF specimen. - LTM EEG overnight with MRI safe leads. - consider repeat MRI with contrast again in AM. - empiric meningitis coverage while waiting for CSF studies.  Plan discussed with patient's husband at bedside and with overnight hospitalist team over secure chat.  Update: Ordered soft wrist restraints along with abdominal binder to help her tolerate cEEG overnight. Will give a 1 time dose of ativan 119m  SaDel[place]ager Number [id number]

## 2022-03-07 NOTE — ED Notes (Signed)
Husband signed consent for lumbar puncture. Family stepping out. Pt positioned for LP in fetal position on L side. Dr. Roderic Palau at [facility]. VSS. Mildly restless.

## 2022-03-07 NOTE — H&P (Addendum)
History and Physical    Patient: Carla Clark [id number] DOB: 02/01/1985 DOA: 03/07/2022 DOS: the patient was seen and examined on 03/07/2022 PCP: Mosie Lukes, MD  Patient coming from: Home - lives with husband and 2 children (35yo, 78mo; NOK: Husband, [phone]  Chief Complaint: AMS  HPI: Carla CORDNERis a 37y.o. female with medical history significant of metastatic breast cancer and Crohn's disease presenting with AMS.  She is currently asleep and her mother provided history.  She was having epistaxis that would not stop.  [place].  [place] and was coherent - recognized her husband and sister.  She laid down and then got up and was trying to go to the bathroom in the kitchen, very disoriented, not making sense.  They called her oncologist and were told to bring her here.  +recent headaches, ?different than usual.  She had her first chemo treatment last Thursday at 1/3 dose.  Was diagnosed with breast cancer in December, tried to get into clinical trial but her platelets dropped and she didn't qualify.  No radiation or surgery.  She has been very tired.  Mets are in bones, she was complaining of joint pain almost a month ago.  T 100.6.  Son has strep, diagnosed Friday.  Daughter has an ear infection.  She was last seen by Dr. EMarin Olpon 2/8 and he noted that her tumor was progressing rapidly and he was concerned about bone marrow involvement.  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  He noted that treatment will be tenuous given her low platelets.    She was treated that day with her first dose of paclitaxel and pembrolizumab.  [facility], noted to have have severe thrombocytopenia (16) and was transfused prior to dc.      ER Course:  Metastatic breast cancer.  AMS, fever.  CT/MRI, failed LP.  Neurology has seen.  [facility]  [facility].     Review of Systems: unable to review all systems due to the inability of the patient to answer questions. Past Medical History:  Diagnosis Date   Anal fissure    Anemia 11/2011   iron deficiency. 01/2013 parenteral iron. Intranasal B12.  Dr EMarin Olpfollows.    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 entero-enteric fistula on 08/2012 CT enterography   Dermatitis 08/22/2016   GERD (gastroesophageal reflux disease)    Hepatitis B immune 07/2012   previous vaccination.    Hives 09/2014   per Dermatologist.  Rx Zyrtec, Benadryl.    Ileitis    Serrated polyp of colon    Small bowel obstruction (HCC)    SVD (spontaneous vaginal delivery) 08/13/2018   Vitamin D deficiency 2014   Past Surgical History:  Procedure Laterality Date   LAPAROSCOPIC RIGHT HEMI COLECTOMY  07/11/2019   WISDOM TOOTH EXTRACTION  37yrs old   Social History:  reports that she has never smoked. She has never used smokeless tobacco. She reports that she does not currently use  alcohol. She reports that she does not use drugs.  Allergies  Allergen Reactions   Other Other (See Comments)    History of Crohn's disease- NO seeds, tomatoes, anything that doesn't digest well, etc.....    Family History  Problem Relation Age of Onset   Nephrolithiasis Father    Brain cancer Father    Other Father        anaplastic astrocytoma   Ovarian cancer Paternal Aunt        ovarian   Breast cancer Paternal Aunt    Cancer Paternal Aunt        breast (2019) and ovarian cancer (2005)   Other Paternal Uncle 21       cyst removed from pancreas   Pancreatic cancer Paternal Uncle        Stage 1   Cancer Paternal Uncle         pancreatic   Lung cancer Maternal Grandmother    Gout Maternal Grandfather    Heart attack Maternal Grandfather    Ovarian cancer Paternal Grandmother    Cancer Paternal Grandmother 83       ovarian   Pancreatic cancer Paternal Grandfather    Colon cancer Neg Hx    Stomach cancer Neg Hx    Esophageal cancer Neg Hx     Prior to Admission medications   Medication Sig Start Date End Date Taking? Authorizing Provider  hydrocortisone (ANUSOL-HC) 2.5 % rectal cream Place 1 Application rectally 2 (two) times daily. Patient taking differently: Place 1 Application rectally 2 (two) times daily as needed for hemorrhoids. 11/10/21  Yes Pyrtle, Lajuan Lines, MD  lidocaine-prilocaine (EMLA) cream Apply 1 Application topically See admin instructions. Apply a dime-size of cream to port-a-cath 1-2 hours prior to access. Cover with [employer]. 02/14/22  Yes [provider]  ondansetron (ZOFRAN) 8 MG tablet Take 1 tablet (8 mg total) by mouth every 8 (eight) hours as needed for nausea or vomiting. 03/01/22  Yes Volanda Napoleon, MD  prochlorperazine (COMPAZINE) 10 MG tablet Take 1 tablet (10 mg total) by mouth every 6 (six) hours as needed for nausea or vomiting. 03/01/22  Yes Volanda Napoleon, MD  SKYRIZI 600 MG/10ML injection Inject 600 mg into the vein once.   Yes [provider]  traMADol (ULTRAM) 50 MG tablet Take 50 mg by mouth every 6 (six) hours as needed (for pain).   Yes [provider]  pyridoxine (NEURO-K-250 VITAMIN B6) 250 MG tablet Take 2 tablets (500 mg total) by mouth daily. 02/24/22   Volanda Napoleon, MD  vitamin B-12 (CYANOCOBALAMIN) 100 MCG tablet Take 100 mcg by mouth daily.    [provider]    Physical Exam: Vitals:   03/07/22 1845 03/07/22 1900 03/07/22 1910 03/07/22 1915  BP:  101/64    Pulse: (!) 118 (!) 115  (!) 109  Resp:      Temp:   99.8 F (37.7 C)   TempSrc:   Oral   SpO2: 100% 100%  100%  Weight:       General:  Appears sedated, did not  arouse during encounter Eyes:  normal lids, eyes closed throughout ENT:  grossly normal lips & tongue Neck:  no LAD, masses or thyromegaly Cardiovascular:  RR with mild tachycardia, no m/r/g. No LE edema.  Respiratory:   CTA bilaterally with no wheezes/rales/rhonchi.  Normal respiratory effort on [place] O2. Abdomen:  soft, NT, ND Skin:  no rash or induration seen on limited exam Musculoskeletal:  no bony abnormality Psychiatric:  currently sedated after being very agitated earlier in the ER, has mittens on Neurologic:  unable to effectively perform   Radiological Exams on Admission: Independently reviewed - see discussion in A/P where applicable  MR BRAIN W WO CONTRAST  Result Date: 03/07/2022 CLINICAL DATA:  Transient ischemic attack. Stroke. Altered mental status. Abnormal left vertebral artery. EXAM: MRI HEAD WITHOUT AND WITH CONTRAST TECHNIQUE: Multiplanar, multiecho pulse sequences of the brain and surrounding structures were obtained without and with intravenous contrast. CONTRAST:  5.49m GADAVIST GADOBUTROL 1 MMOL/ML IV SOLN COMPARISON:  CT studies earlier same day. FINDINGS: Brain: Diffusion imaging does not show any acute or subacute infarction. No focal abnormality is seen affecting the brainstem or cerebellum. The cerebral hemispheres appear normal without evidence of cortical or large vessel stroke or metastatic lesion. Patient has a right-sided holo convexity subdural collection measuring 2.5 mm in thickness, not visible on the CT scan. This is most consistent with a late subacute or chronic subdural collection. The postcontrast imaging is degraded by motion but I do not see any sign of dural enhancement. Whereas it is not possible to exclude dural tumor in this case, I do not have any positive evidence for that. If we were dealing with dural metastatic disease and might expect some finding on the left. No hydrocephalus.  No midline shift Vascular: Major vessels at the base of the brain  show flow. Skull and upper cervical spine: No osseous disease is discernible in the region of the calvarium, skull base or upper cervical spine. Sinuses/Orbits: Clear/normal Other: None IMPRESSION: 1. No evidence of acute stroke or parenchymal metastatic disease. 2. Right-sided holo convexity subdural collection measuring 2.5 mm in thickness, not visible on the CT scan. 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 I do not see any sign of dural enhancement, though the postcontrast imaging is markedly degraded by motion. Whereas it is not possible to exclude dural tumor in this case given the imaging we have, I do not have any positive evidence for that. If we were dealing with dural metastatic disease I might also expect some finding on the left. One could consider repeating the postcontrast imaging when the patient is in a condition that would allow motion free scanning. 3. Case discussed with Dr. SQuinn Axeat about 1545 hours. Electronically Signed   By: MNelson ChimesM.D.   On: 03/07/2022 15:55   MR ANGIO NECK W WO CONTRAST  Result Date: 03/07/2022 CLINICAL DATA:  Neuro deficit, acute, stroke suspected. Possible vertebral occlusion on CT angiography of the neck. History of metastatic breast cancer. EXAM: MRA NECK WITHOUT AND WITH CONTRAST TECHNIQUE: Multiplanar and multiecho pulse sequences of the neck were obtained without and with intravenous contrast. Angiographic images of the neck were obtained using MRA technique without and with intravenous contrast. CONTRAST:  5.51mGADAVIST GADOBUTROL 1 MMOL/ML IV SOLN COMPARISON:  CT angiography same day FINDINGS: Contrast enhanced portion of the examination is markedly degraded by motion. Precontrast time-of-flight imaging is better quality. Unfortunately, the technologist did not perform the requested T1 axial fat-sat imaging. None the less, in evaluating the time-of-flight imaging, I think there is convincing evidence that there is a left  vertebral artery dissection. Maximal stenosis of the true lumen in that region is about 40%, to estimation. Otherwise, the findings in the region are normal. The carotid bifurcation regions are normal. The right vertebral artery is normal. IMPRESSION: 1. Motion degraded contrast enhanced portion of the study.  2. Precontrast T1 fat saturated axial imaging was not done as requested. However, the left vertebral artery does show a fairly convincing dissection with maximal stenosis of about 40%., based on the axial time-of-flight imaging. This extends from the level of about C7 to the level of about C4. 3. Normal right vertebral artery. 4. Normal carotid bifurcation regions. Electronically Signed   By: Nelson Chimes M.D.   On: 03/07/2022 15:30   CT ANGIO HEAD NECK W WO CM W PERF (CODE STROKE)  Addendum Date: 03/07/2022   ADDENDUM REPORT: 03/07/2022 15:26 ADDENDUM: CT perfusion was also performed. This demonstrates reported small (12 mL) of penumbra which may be artifactual given located in multiple vascular territories and also in the inferior temporal lobes which is often artifactual. An MRI could better assess for acute infarct. No evidence of core infarct by CT perfusion. Electronically Signed   By: Margaretha Sheffield M.D.   On: 03/07/2022 15:26   Result Date: 03/07/2022 CLINICAL DATA:  Neuro deficit, acute, stroke suspected EXAM: CT ANGIOGRAPHY HEAD AND NECK TECHNIQUE: Multidetector CT imaging of the head and neck was performed using the standard protocol during bolus administration of intravenous contrast. Multiplanar CT image reconstructions and MIPs were obtained to evaluate the vascular anatomy. Carotid stenosis measurements (when applicable) are obtained utilizing NASCET criteria, using the distal internal carotid diameter as the denominator. RADIATION DOSE REDUCTION: This exam was performed according to the departmental dose-optimization program which includes automated exposure control, adjustment of the  mA and/or kV according to patient size and/or use of iterative reconstruction technique. CONTRAST:  167m OMNIPAQUE IOHEXOL 350 MG/ML SOLN COMPARISON:  None Available. FINDINGS: CTA NECK FINDINGS Aortic arch: Great vessel origins are patent without significant stenosis. Right carotid system: No evidence of dissection, stenosis (50% or greater), or occlusion. Left carotid system: No evidence of dissection, stenosis (50% or greater), or occlusion. Vertebral arteries: Luminal irregularity and eccentric soft tissue attenuation in the left V2 vertebral artery from C4-C5 to C6-C7, which is concerning for dissection and possibly intramural hematoma (for example see series 9, image 118; series 7, images 242 through 192). Right vertebral artery is patent without significant stenosis or evidence of dissection. Skeleton: Extensive osseous metastatic disease. Other neck: No acute findings on limited assessment. Upper chest: Visualized lung apices are clear. Please see prior PET-CT for evaluation of adenopathy. Review of the MIP images confirms the above findings CTA HEAD FINDINGS Anterior circulation: Bilateral intracranial ICAs, MCAs, and ACAs are patent without proximal hemodynamically significant stenosis Posterior circulation: Bilateral intradural vertebral arteries, basilar artery and bilateral posterior cerebral arteries are patent without proximal hemodynamically significant stenosis. Venous sinuses: As permitted by contrast timing, patent. Review of the MIP images confirms the above findings IMPRESSION: 1. 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 Resulting moderate to severe stenosis of the vertebral artery. An MRA of the neck with axial T1 fat saturation may be helpful to further evaluate if clinically warranted. 2. Extensive osseous metastatic disease. Findings discussed with Dr. SQuinn Axevia telephone at 12:55 p.m. Electronically Signed: By: FMargaretha SheffieldM.D. On:  03/07/2022 13:03   DG Chest Portable 1 View  Result Date: 03/07/2022 CLINICAL DATA:  Breast cancer EXAM: PORTABLE CHEST 1 VIEW COMPARISON:  PET-CT 01/21/2022 FINDINGS: Right-sided Port-A-Cath with the tip projecting over the SVC. No focal consolidation. No pleural effusion or pneumothorax. Heart and mediastinal contours are unremarkable. No acute osseous abnormality. IMPRESSION: No active disease. Electronically Signed   By: HBoston ServiceD.  On: 03/07/2022 13:44   CT HEAD CODE STROKE WO CONTRAST  Result Date: 03/07/2022 CLINICAL DATA:  Code stroke.  Neuro deficit, acute, stroke suspected EXAM: CT HEAD WITHOUT CONTRAST TECHNIQUE: Contiguous axial images were obtained from the base of the skull through the vertex without intravenous contrast. RADIATION DOSE REDUCTION: This exam was performed according to the departmental dose-optimization program which includes automated exposure control, adjustment of the mA and/or kV according to patient size and/or use of iterative reconstruction technique. COMPARISON:  None Available. FINDINGS: Brain: No evidence of acute large vascular territory infarction, hemorrhage, hydrocephalus, extra-axial collection or mass lesion/mass effect. Vascular: No hyperdense vessel identified. Skull: No acute fracture. Sinuses/Orbits: Clear sinuses.  No acute orbital findings. Other: No mastoid effusions. ASPECTS [facility] Stroke Program Early CT Score) total score (0-10 with 10 being normal): 10. IMPRESSION: 1. No evidence of acute intracranial abnormality. 2. ASPECTS is 10. Code stroke imaging results were communicated on 03/07/2022 at 12:32 pm to provider Pearline Cables via telephone, who verbally acknowledged these results. Electronically Signed   By: Margaretha Sheffield M.D.   On: 03/07/2022 12:33    EKG: Independently reviewed.  Sinus tachycardia with rate 114; no evidence of acute ischemia   Labs on Admission: I have personally reviewed the available labs and imaging studies at the time  of the admission.  Pertinent labs:    VBG: 7.47/36/26.2 AST 112/ALT 159; improved Lactate 1.2, 1.2 WBC 6.1 Hgb 8.2 Platelets 104 ASA <7 APAP <10 ETOH <10 COVID/flu/RSV negative   Assessment and Plan: Principal Problem:   Acute metabolic encephalopathy Active Problems:   Crohn's disease ([place])   Breast cancer metastasized to multiple sites, left ([place])   Thrombocytopenia ([place])    Acute metabolic encephalopathy -Patient presenting with encephalopathy as evidenced by her perseveration/agitation -She was recently diagnosed with metastatic breast cancer and had her first chemo treatment on 2/8 -Evaluation limited by patient's significant sedation -There is no evidence of infection, but meningitis in an immunocompromised patient is a consideration; LP attempted by EDP and failed and so repeat attempt by occur upon arrival at [facility] -Her breast cancer is a clear concern, with possible leptomeningeal spread -Imaging also shows a small resolving SDH and vertebral artery dissection which may be related to a minor recent trauma but are probably incidental findings at this time -Will admit to [facility] progressive care unit -Neurology consulted at [facility] and will see her again upon arrival at [facility] -Will treat empirically for meningitis with acyclovir, Vanc, Meropenem and other abx as per pharmacy (also received Rocephin and Ampicillin in the ER) -She has also been recommended for continuous EEG; of note, tramadol was listed as one of her medications although last dose is uncertain  Metastatic breast cancer -Aggressive variant -Initial PET was on 12/29 and showed "diffuse osseous metastatic disease" and this was appreciated again on imaging today -Certainly this raises concern for leptomeningeal involvement although there is no frank evidence of brain disease at this time -Dr. Marin Olp has been added to the care team  Thrombocytopenia -Thought to be from marrow involvement from her malignancy,  possibly exacerbated by chemo -Epistaxis from this AM does not appear to have recurred  -She should be started on antiplatelet therapy for her vertebral dissection but is not possible given her platelet derangement -Will follow daily CBC  Crohn's -Hold Skyrizi for now   Note:  Hgb came back at 5.9.  Repeating, consulting PCCM to see as well.   Advance Care Planning:   Code Status: Full Code -  not fully discussed as her husband was not present at the time of admission  Consults: Neurology  DVT Prophylaxis: SCDs  Family Communication: Mother was present throughout evaluation  Severity of Illness: The appropriate patient status for this patient is INPATIENT. Inpatient status is judged to be reasonable and necessary in order to provide the required intensity of service to ensure the patient's safety. The patient's presenting symptoms, physical exam findings, and initial radiographic and laboratory data in the context of their chronic comorbidities is felt to place them at high risk for further clinical deterioration. Furthermore, it is not anticipated that the patient will be medically stable for discharge from the hospital within 2 midnights of admission.   * I certify that at the point of admission it is my clinical judgment that the patient will require inpatient hospital care spanning beyond 2 midnights from the point of admission due to high intensity of service, high risk for further deterioration and high frequency of surveillance required.*  Author: Karmen Bongo, MD 03/07/2022 8:45 PM  For on call review [email or web address].

## 2022-03-07 NOTE — ED Notes (Signed)
Continues to move. NAD, relatively calm, but not still, not following re-direction.

## 2022-03-07 NOTE — Progress Notes (Signed)
Pharmacy Antibiotic Note  Carla Clark is a 37 y.o. female admitted on 03/07/2022 with concern for meningitis.  Pharmacy previously consulted for Vanco, Acyclovir dosing for same; now asked by Neurology service to dose "appropriate empiric coverage for CNS infection in patient immunosuppressed on chemo."  Plan: Continue vancomycin 500 mg IV q8 hr as ordered Continue Acyclovir 10 mg/kg IV q8 hr as ordered Start meropenem 2g IV q8 hr While meropenem does cover listeria, ampicillin is generally preferred for confirmed listeria infection Since LP unlikely to be obtained d/t severe thrombocytopenia, recommend ID consult to guide empiric antibiotic selection  Weight: 54.3 kg (119 lb 12.8 oz)  Temp (24hrs), Avg:99.7 F (37.6 C), Min:98.7 F (37.1 C), Max:100.6 F (38.1 C)  Recent Labs  Lab 03/03/22 0810 03/07/22 1133 03/07/22 1135 03/07/22 1630  WBC 19.9* 6.1  --   --   CREATININE 0.82 0.66  --   --   LATICACIDVEN  --   --  1.2 1.2     Estimated Creatinine Clearance: 82 mL/min (by C-G formula based on SCr of 0.66 mg/dL).    Allergies  Allergen Reactions   Other Other (See Comments)    History of Crohn's disease- NO seeds, tomatoes, anything that doesn't digest well, etc.....   Antimicrobials this admission: Amp, CTX x 1 Vanco 2/12 >> Acyclovir 2/12 >> Meropenem 2/12 >>  Dose adjustments this admission: N/a  Microbiology results: 2/12 BCx: sent 2/12 Strep throat PCR: neg   Reuel Boom, PharmD, BCPS [phone] 03/07/2022, 9:13 PM

## 2022-03-07 NOTE — ED Notes (Signed)
Back to exam room. Mother present at [facility]. Pt intermittently restless and fidgety and calm. VSS. No significant changes. Remains sedated after droperidol and ativan. Returned to ED monitor, O2 and purewick

## 2022-03-07 NOTE — ED Notes (Signed)
Restless, agitated, resisting family, trying to crawl out of bed, preparing for LP.

## 2022-03-07 NOTE — ED Notes (Signed)
EDP at [facility]. Pt alert, NAD, calm, cooperative, confused, following some commands, answering some questions, no respiratory distress. Intermittently unable to follow commands, or follows incorrectly. Repetitive, perseverating. Husband at [facility].

## 2022-03-07 NOTE — Procedures (Signed)
Lumbar Puncture Procedure Note  KERSTON HARTKOPF  NQ:[id number]  1985-03-29  Date:03/07/22  Time:11:03 PM   Provider Performing:Dameisha Tschida   Procedure: Lumbar Puncture [id number])  Indication(s) Rule out meningitis  Consent Risks of the procedure as well as the alternatives and risks of each were explained to the patient and/or caregiver.  Consent for the procedure was obtained and is signed in the bedside chart  Anesthesia Topical only with 1% lidocaine    Time Out Verified patient identification, verified procedure, site/side was marked, verified correct patient position, special equipment/implants available, medications/allergies/relevant history reviewed, required imaging and test results available.   Sterile Technique Maximal sterile technique including sterile barrier drape, hand hygiene, sterile gown, sterile gloves, mask, hair covering.    Procedure Description Using palpation, approximate location of L3-L4 space identified.   Lidocaine used to anesthetize skin and subcutaneous tissue overlying this area.  A 20g spinal needle was then used to access the subarachnoid space. Opening pressure: 24 cm H2O. Closing pressure:Not obtained. 14 CSF obtained.  Complications/Tolerance None; patient tolerated the procedure well.   EBL Minimal   Specimen(s) CSF

## 2022-03-07 NOTE — Progress Notes (Deleted)
A consult was received from an ED physician for [place] per pharmacy dosing.  The patient's profile has been reviewed for ht/wt/allergies/indication/available labs.   A one time order has been placed for Vanco 1g IV x 1 (1m/kg)  Further antibiotics/pharmacy consults should be ordered by admitting physician if indicated.                        Hajime Asfaw S. RAlford Highland PharmD, BCPS Clinical Staff Pharmacist [email or web address] Thank you, RWayland Salinas2/12/2022  5:09 PM

## 2022-03-07 NOTE — ED Notes (Addendum)
EDP Dr. Roderic Palau into room, at [facility]. Sz Pads applied to bed rails for protection.

## 2022-03-07 NOTE — ED Provider Notes (Signed)
Patient's urine and strep were negative.  We attempted an LP but were unsuccessful.  I spoke with neurology Dr. Quinn Axe and she wanted the patient treated for meningitis and admitted to stepdown by the hospitalist with neurology consulting.  She will get another LP done at [facility], MD 03/07/22 1921

## 2022-03-07 NOTE — Progress Notes (Signed)
[place] 1153  Dr. Quinn Axe paged at [place] Dr. Quinn Axe on cart at 1204 Pt to CT at 24 Pt back from [place]   Pts husband states that the pt spent the night in the ER at [facility] due to a bloody nose and she received a platelet transfusion. Pt has stage 4 metastatic breast cancer and just had her first chemo treatment on last Thursday. At 0900 this morning the pts husband states that she was completely her normal self. She took a nap and woke up around 1045 altered, not recognizing her husband, and attempting to use the restroom in the kitchen. Pt will follow some commands but can be heard counting and saying her name repeatedly.

## 2022-03-07 NOTE — ED Notes (Signed)
Neur exam in progress via tele neuro.

## 2022-03-07 NOTE — ED Notes (Signed)
CTs complete.

## 2022-03-07 NOTE — ED Notes (Signed)
To MRI on stretcher, on monitor, resting/ sleeping, NAD, relatively calm, no changes. Intermittent movement.

## 2022-03-07 NOTE — ED Notes (Signed)
LP tray at [facility]

## 2022-03-07 NOTE — ED Notes (Signed)
MRI tech at [facility]. Preparing to go to MRI. Mother and husband at [facility].

## 2022-03-07 NOTE — Telephone Encounter (Signed)
Received phone note from Access Nurse that patient called over the weekend about a nose bleed that was difficult to stop.  Notified Dr Marin Olp.  Wants patient to come in for labwork.  Scheduling message sent.

## 2022-03-07 NOTE — ED Notes (Signed)
Back to exam room, no changes, husband at [facility].

## 2022-03-07 NOTE — ED Provider Notes (Signed)
[facility] Provider Note  CSN: [id number] Arrival date & time: 03/07/22 1120  Chief Complaint(s) Altered Mental Status  HPI Carla Clark is a 37 y.o. female with past medical history as below, significant for metastatic breast cancer to the bone, Crohn's disease, GERD, who presents to the ED with complaint of ams.  Patient was seen last night at [place] secondary to nosebleed, she given platelet transfusion and nosebleed resolved, she received CT head at that time which was unremarkable per family.  She arrived home around 9 to 9:30 AM this morning and was at her baseline.  She went to take a nap woke up around 1045 to 11 AM and was confused, did not recognize family members, unable to speak her typical level, became lost in her home and laid on the floor because she could not find the bathroom.  Difficulty naming common objects, difficulty recognizing her spouse.   No thinners, patient thinks she may have had a ground level fall last night w/o head injury but she is unsure.  Reports she is having some weakness on her left side, thinks her vision to be blurry but difficult for her to communicate secondary to aphasia.  Word finding difficulties.  Level 5 caveat, aphasia  Code stroke was activated on arrival   Past Medical History Past Medical History:  Diagnosis Date   Anal fissure    Anemia 11/2011   iron deficiency. 01/2013 parenteral iron. Intranasal B12.  Dr Marin Olp follows.    B12 deficiency 11/2012   Breast cancer [facility])    Breast cancer metastasized to multiple sites, left ([place]) 02/02/2022   Breast mass in female 04/21/2012   Cervical cancer screening 01/27/2012   Chicken pox as a child   Crohn's disease ([place]) 2013   ileitis 10/2011. ileitis, mesenteric abscess, ? entero-enteric fistula on 08/2012 CT enterography   Dermatitis 08/22/2016   GERD (gastroesophageal reflux disease)    Hepatitis B immune 07/2012   previous vaccination.     Hives 09/2014   per Dermatologist.  Rx Zyrtec, Benadryl.    Ileitis    Serrated polyp of colon    Small bowel obstruction (HCC)    SVD (spontaneous vaginal delivery) 08/13/2018   Vitamin D deficiency 2014   Patient Active Problem List   Diagnosis Date Noted   Breast cancer metastasized to multiple sites, left ([place]) 02/02/2022   Anhedonia 05/05/2021   Indication for care in labor or delivery 08/12/2018   Amenorrhea 08/24/2016   Dermatitis 08/22/2016   B12 deficiency 08/18/2016   History of colon polyps 08/18/2016   History of small bowel obstruction 11/20/2015   Eczema 11/05/2013   Gastroesophageal reflux disease without esophagitis 09/10/2013   Other iron deficiency anemia 01/21/2013   Crohn's disease ([place]) 08/07/2012   Breast mass in female 04/21/2012   Cervical cancer screening 01/27/2012   Preventative health care 11/06/2011   Home Medication(s) Prior to Admission medications   Medication Sig Start Date End Date Taking? Authorizing Provider  hydrocortisone (ANUSOL-HC) 2.5 % rectal cream Place 1 Application rectally 2 (two) times daily. Patient taking differently: Place 1 Application rectally 2 (two) times daily as needed for hemorrhoids. 11/10/21  Yes Pyrtle, Lajuan Lines, MD  lidocaine-prilocaine (EMLA) cream Apply 1 Application topically See admin instructions. Apply a dime-size of cream to port-a-cath 1-2 hours prior to access. Cover with [employer]. 02/14/22  Yes [provider]  ondansetron (ZOFRAN) 8 MG tablet Take 1 tablet (8 mg total) by mouth  every 8 (eight) hours as needed for nausea or vomiting. 03/01/22  Yes Volanda Napoleon, MD  prochlorperazine (COMPAZINE) 10 MG tablet Take 1 tablet (10 mg total) by mouth every 6 (six) hours as needed for nausea or vomiting. 03/01/22  Yes Volanda Napoleon, MD  SKYRIZI 600 MG/10ML injection Inject 600 mg into the vein once.   Yes [provider]  traMADol (ULTRAM) 50 MG tablet Take 50 mg by mouth every 6 (six) hours as  needed (for pain).   Yes [provider]  pyridoxine (NEURO-K-250 VITAMIN B6) 250 MG tablet Take 2 tablets (500 mg total) by mouth daily. 02/24/22   Volanda Napoleon, MD  vitamin B-12 (CYANOCOBALAMIN) 100 MCG tablet Take 100 mcg by mouth daily.    [provider]                                                                                                                                    Past Surgical History Past Surgical History:  Procedure Laterality Date   LAPAROSCOPIC RIGHT HEMI COLECTOMY  07/11/2019   WISDOM TOOTH EXTRACTION  37 yrs old   Family History Family History  Problem Relation Age of Onset   Nephrolithiasis Father    Brain cancer Father    Other Father        anaplastic astrocytoma   Ovarian cancer Paternal Aunt        ovarian   Breast cancer Paternal Aunt    Cancer Paternal Aunt        breast (2019) and ovarian cancer (2005)   Other Paternal Uncle 22       cyst removed from pancreas   Pancreatic cancer Paternal Uncle        Stage 1   Cancer Paternal Uncle        pancreatic   Lung cancer Maternal Grandmother    Gout Maternal Grandfather    Heart attack Maternal Grandfather    Ovarian cancer Paternal Grandmother    Cancer Paternal Grandmother 83       ovarian   Pancreatic cancer Paternal Grandfather    Colon cancer Neg Hx    Stomach cancer Neg Hx    Esophageal cancer Neg Hx     Social History Social History   Tobacco Use   Smoking status: Never   Smokeless tobacco: Never   Tobacco comments:    never used tobacco  Vaping Use   Vaping Use: Never used  Substance Use Topics   Alcohol use: Not Currently    Alcohol/week: 0.0 standard drinks of alcohol    Comment: occasionaly   Drug use: No   Allergies Other  Review of Systems Review of Systems  Unable to perform ROS: Mental status change    Physical Exam Vital Signs  I have reviewed the triage vital signs BP (!) 104/94   Pulse (!) 123   Temp (!) 100.6 F (38.1 C)  (  Axillary)   Resp (!) 21   Wt 54.3 kg   SpO2 100%   BMI 21.22 kg/m  Physical Exam Constitutional:      General: She is not in acute distress.    Appearance: Normal appearance. She is not ill-appearing.  HENT:     Head: Normocephalic and atraumatic.     Nose: Nose normal.     Mouth/Throat:     Mouth: Mucous membranes are dry.  Eyes:     General: No scleral icterus.       Right eye: No discharge.        Left eye: No discharge.     Extraocular Movements: Extraocular movements intact.     Pupils: Pupils are equal, round, and reactive to light.  Cardiovascular:     Rate and Rhythm: Regular rhythm. Tachycardia present.     Pulses: Normal pulses.     Heart sounds: Normal heart sounds. No murmur heard. Pulmonary:     Effort: Pulmonary effort is normal. No respiratory distress.     Breath sounds: Normal breath sounds. No stridor.  Abdominal:     General: Abdomen is flat. There is no distension.     Palpations: Abdomen is soft.     Tenderness: There is no abdominal tenderness.  Musculoskeletal:     Cervical back: No rigidity.     Right lower leg: No edema.     Left lower leg: No edema.  Skin:    Capillary Refill: Capillary refill takes less than 2 seconds.     Coloration: Skin is pale.  Neurological:     Mental Status: She is alert. She is disoriented and confused.     GCS: GCS eye subscore is 4. GCS verbal subscore is 5. GCS motor subscore is 6.     Comments: Aphasia Intermittently following commands Unable to complete strength testing to b/l UE Withdraws to painful stimulus in all 4 extremities      ED Results and Treatments Labs (all labs ordered are listed, but only abnormal results are displayed) Labs Reviewed  CBC WITH DIFFERENTIAL/PLATELET - Abnormal; Notable for the following components:      Result Value   RBC 2.99 (*)    Hemoglobin 8.2 (*)    HCT 25.4 (*)    Platelets 104 (*)    nRBC 0.7 (*)    Neutro Abs 0.8 (*)    Lymphs Abs 5.1 (*)    All other  components within normal limits  LIPASE, BLOOD - Abnormal; Notable for the following components:   Lipase 69 (*)    All other components within normal limits  HEPATIC FUNCTION PANEL - Abnormal; Notable for the following components:   AST 112 (*)    ALT 149 (*)    Bilirubin, Direct 0.3 (*)    All other components within normal limits  PROTIME-INR - Abnormal; Notable for the following components:   Prothrombin Time 15.6 (*)    INR 1.3 (*)    All other components within normal limits  BLOOD GAS, VENOUS - Abnormal; Notable for the following components:   pH, Ven 7.47 (*)    pCO2, Ven 36 (*)    pO2, Ven <31 (*)    Acid-Base Excess 2.7 (*)    All other components within normal limits  ACETAMINOPHEN LEVEL - Abnormal; Notable for the following components:   Acetaminophen (Tylenol), Serum <10 (*)    All other components within normal limits  SALICYLATE LEVEL - Abnormal; Notable for the following components:  Salicylate Lvl Q000111Q (*)    All other components within normal limits  RESP PANEL BY RT-PCR (RSV, FLU A&B, COVID)  RVPGX2  GROUP A STREP BY PCR  CULTURE, BLOOD (ROUTINE X 2)  CULTURE, BLOOD (ROUTINE X 2)  BASIC METABOLIC PANEL  AMMONIA  OSMOLALITY  HCG, SERUM, QUALITATIVE  LACTIC ACID, PLASMA  ETHANOL  APTT  URINALYSIS, ROUTINE W REFLEX MICROSCOPIC  LACTIC ACID, PLASMA  RAPID URINE DRUG SCREEN, HOSP PERFORMED  CBC  CBG MONITORING, ED  I-STAT CHEM 8, ED  I-STAT BETA HCG BLOOD, ED ([facility], [facility], [facility] ONLY)                                                                                                                          Radiology MR ANGIO NECK W WO CONTRAST  Result Date: 03/07/2022 CLINICAL DATA:  Neuro deficit, acute, stroke suspected. Possible vertebral occlusion on CT angiography of the neck. History of metastatic breast cancer. EXAM: MRA NECK WITHOUT AND WITH CONTRAST TECHNIQUE: Multiplanar and multiecho pulse sequences of the neck were obtained without and with intravenous  contrast. Angiographic images of the neck were obtained using MRA technique without and with intravenous contrast. CONTRAST:  5.30m GADAVIST GADOBUTROL 1 MMOL/ML IV SOLN COMPARISON:  CT angiography same day FINDINGS: Contrast enhanced portion of the examination is markedly degraded by motion. Precontrast time-of-flight imaging is better quality. Unfortunately, the technologist did not perform the requested T1 axial fat-sat imaging. 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 Maximal stenosis of the true lumen in that region is about 40%, to estimation. Otherwise, the findings in the region are normal. The carotid bifurcation regions are normal. The right vertebral artery is normal. IMPRESSION: 1. Motion degraded contrast enhanced portion of the study. 2. Precontrast T1 fat saturated axial imaging was not done as requested. However, the left vertebral artery does show a fairly convincing dissection with maximal stenosis of about 40%., based on the axial time-of-flight imaging. This extends from the level of about C7 to the level of about C4. 3. Normal right vertebral artery. 4. Normal carotid bifurcation regions. Electronically Signed   By: MNelson ChimesM.D.   On: 03/07/2022 15:30   CT ANGIO HEAD NECK W WO CM W PERF (CODE STROKE)  Addendum Date: 03/07/2022   ADDENDUM REPORT: 03/07/2022 15:26 ADDENDUM: CT perfusion was also performed. This demonstrates reported small (12 mL) of penumbra which may be artifactual given located in multiple vascular territories and also in the inferior temporal lobes which is often artifactual. An MRI could better assess for acute infarct. No evidence of core infarct by CT perfusion. Electronically Signed   By: FMargaretha SheffieldM.D.   On: 03/07/2022 15:26   Result Date: 03/07/2022 CLINICAL DATA:  Neuro deficit, acute, stroke suspected EXAM: CT ANGIOGRAPHY HEAD AND NECK TECHNIQUE:  Multidetector CT imaging of the head and neck was performed using the standard protocol  during bolus administration of intravenous contrast. Multiplanar CT image reconstructions and MIPs were obtained to evaluate the vascular anatomy. Carotid stenosis measurements (when applicable) are obtained utilizing NASCET criteria, using the distal internal carotid diameter as the denominator. RADIATION DOSE REDUCTION: This exam was performed according to the departmental dose-optimization program which includes automated exposure control, adjustment of the mA and/or kV according to patient size and/or use of iterative reconstruction technique. CONTRAST:  165m OMNIPAQUE IOHEXOL 350 MG/ML SOLN COMPARISON:  None Available. FINDINGS: CTA NECK FINDINGS Aortic arch: Great vessel origins are patent without significant stenosis. Right carotid system: No evidence of dissection, stenosis (50% or greater), or occlusion. Left carotid system: No evidence of dissection, stenosis (50% or greater), or occlusion. Vertebral arteries: Luminal irregularity and eccentric soft tissue attenuation in the left V2 vertebral artery from C4-C5 to C6-C7, which is concerning for dissection and possibly intramural hematoma (for example see series 9, image 118; series 7, images 242 through 192). Right vertebral artery is patent without significant stenosis or evidence of dissection. Skeleton: Extensive osseous metastatic disease. Other neck: No acute findings on limited assessment. Upper chest: Visualized lung apices are clear. Please see prior PET-CT for evaluation of adenopathy. Review of the MIP images confirms the above findings CTA HEAD FINDINGS Anterior circulation: Bilateral intracranial ICAs, MCAs, and ACAs are patent without proximal hemodynamically significant stenosis Posterior circulation: Bilateral intradural vertebral arteries, basilar artery and bilateral posterior cerebral arteries are patent without proximal hemodynamically significant  stenosis. Venous sinuses: As permitted by contrast timing, patent. Review of the MIP images confirms the above findings IMPRESSION: 1. 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 Resulting moderate to severe stenosis of the vertebral artery. An MRA of the neck with axial T1 fat saturation may be helpful to further evaluate if clinically warranted. 2. Extensive osseous metastatic disease. Findings discussed with Dr. SQuinn Axevia telephone at 12:55 p.m. Electronically Signed: By: FMargaretha SheffieldM.D. On: 03/07/2022 13:03   DG Chest Portable 1 View  Result Date: 03/07/2022 CLINICAL DATA:  Breast cancer EXAM: PORTABLE CHEST 1 VIEW COMPARISON:  PET-CT 01/21/2022 FINDINGS: Right-sided Port-A-Cath with the tip projecting over the SVC. No focal consolidation. No pleural effusion or pneumothorax. Heart and mediastinal contours are unremarkable. No acute osseous abnormality. IMPRESSION: No active disease. Electronically Signed   By: HKathreen DevoidM.D.   On: 03/07/2022 13:44   CT HEAD CODE STROKE WO CONTRAST  Result Date: 03/07/2022 CLINICAL DATA:  Code stroke.  Neuro deficit, acute, stroke suspected EXAM: CT HEAD WITHOUT CONTRAST TECHNIQUE: Contiguous axial images were obtained from the base of the skull through the vertex without intravenous contrast. RADIATION DOSE REDUCTION: This exam was performed according to the departmental dose-optimization program which includes automated exposure control, adjustment of the mA and/or kV according to patient size and/or use of iterative reconstruction technique. COMPARISON:  None Available. FINDINGS: Brain: No evidence of acute large vascular territory infarction, hemorrhage, hydrocephalus, extra-axial collection or mass lesion/mass effect. Vascular: No hyperdense vessel identified. Skull: No acute fracture. Sinuses/Orbits: Clear sinuses.  No acute orbital findings. Other: No mastoid effusions. ASPECTS ([facility] Early CT  Score) total score (0-10 with 10 being normal): 10. IMPRESSION: 1. No evidence of acute intracranial abnormality. 2. ASPECTS is 10. Code stroke imaging results were communicated on 03/07/2022 at 12:32 pm to provider GPearline Cablesvia telephone, who verbally acknowledged these results. Electronically Signed   By: FMargaretha SheffieldM.D.   On: 03/07/2022 12:33    Pertinent labs &  imaging results that were available during my care of the patient were reviewed by me and considered in my medical decision making (see MDM for details).  Medications Ordered in ED Medications  acetaminophen (TYLENOL) suppository 650 mg (has no administration in time range)  lactated ringers bolus 1,000 mL (has no administration in time range)  cefTRIAXone (ROCEPHIN) 2 g in sodium chloride 0.9 % 100 mL IVPB (has no administration in time range)  iohexol (OMNIPAQUE) 350 MG/ML injection 100 mL (100 mLs Intravenous Contrast Given 03/07/22 1237)  LORazepam (ATIVAN) injection 2 mg (2 mg Intravenous Given 03/07/22 1247)  sodium chloride 0.9 % bolus 1,000 mL (0 mLs Intravenous Stopped 03/07/22 1334)  LORazepam (ATIVAN) injection 2 mg (2 mg Intravenous Given 03/07/22 1400)  droperidol (INAPSINE) 2.5 MG/ML injection 1.25 mg (1.25 mg Intravenous Given 03/07/22 1424)  gadobutrol (GADAVIST) 1 MMOL/ML injection 5.5 mL (5.5 mLs Intravenous Contrast Given 03/07/22 1512)  LORazepam (ATIVAN) injection 2 mg (2 mg Intravenous Given 03/07/22 1618)                                                                                                                                     Procedures .Critical Care  Performed by: Jeanell Sparrow, DO Authorized by: Jeanell Sparrow, DO   Critical care provider statement:    Critical care time (minutes):  80   Critical care time was exclusive of:  Separately billable procedures and treating other patients   Critical care was necessary to treat or prevent imminent or life-threatening deterioration of the following  conditions:  CNS failure or compromise   Critical care was time spent personally by me on the following activities:  Development of treatment plan with patient or surrogate, discussions with consultants, evaluation of patient's response to treatment, examination of patient, ordering and review of laboratory studies, ordering and review of radiographic studies, ordering and performing treatments and interventions, pulse oximetry, re-evaluation of patient's condition and review of old charts   (including critical care time)  Medical Decision Making / ED Course    Medical Decision Making:    SHADAI REDDISH is a 37 y.o. female with past medical history as below, significant for metastatic breast cancer to the bone, Crohn's disease, GERD, who presents to the ED with complaint of ams. . The complaint involves an extensive differential diagnosis and also carries with it a high risk of complications and morbidity.  Serious etiology was considered. Ddx includes but is not limited to: Differential diagnoses for altered mental status includes but is not exclusive to alcohol, illicit or prescription medications, intracranial pathology such as stroke, intracerebral hemorrhage, fever or infectious causes including sepsis, hypoxemia, uremia, trauma, endocrine related disorders such as diabetes, hypoglycemia, thyroid-related diseases, etc.   Complete initial physical exam performed, notably the patient  was aphasia present, POC glucose was stable.    Reviewed and confirmed nursing documentation for past medical history, family history, social  history.  Vital signs reviewed.   Discussed with patient's sister and spouse at bedside.  Last known normal was around 9 to 9:30 AM this morning.  Woke up around 11 AM with confusion, word finding difficulty, speech changes.    Clinical Course as of 03/07/22 1625  Mon Mar 07, 2022  1546 D/w neuro Dr Quinn Axe, unclear explanation for her symptoms today. 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  Rec pending urinalysis, if this is negative then recommend LP.  LP and UA are negative recommend continuous EEG.  Either way will require admission to [facility] for further workup [SG]  66 Spoke w/ pt's family; pt exposed to son who has strep throat yesterday. Also concern for possible UTI. Sent strep testing, UA pending. Will order blood cultures and give another bolus of fluid. [SG]  [id number] Pt with significant agitation, this was not present on initial assessment. Will give rpt dose of ativan. Likely unable to lie still for bedside procedure. D/w IR re: LP with sedation. Would be unable to perform tonight but could add her in the AM.  [SG]    Clinical Course User Index [SG] Jeanell Sparrow, DO   Unclear source of patient's symptoms today She also has elevated liver enzymes of unclear significance, no abd pain, her bili is not acutely elevated and ammonia is wnl, INR is 1.3, etoh neg Imaging is not revealing of possible source of aphasia/mental status changes Pt is now febrile and tachycardiac w/o known source Given empiric rocephin.  UA and Strep testing pending Neuro recommending LP if platelets allow, will send rpt CBC as she was transfused last night Pt signed out to incoming EDP pending further evaluation and eventual admission to [facility]  Pt agitated, would likely require at least moderate sedation to facilitate procedure.  Family/pt updated at bedside; agreeable to plan.     Additional history obtained: -Additional history obtained from family and spouse -External records from outside source obtained and reviewed including: Chart review including previous notes, labs, imaging, consultation notes including OSH records, primary care documentation, home medications, prior labs and imaging   Lab Tests: -I ordered, reviewed, and interpreted labs.   The pertinent results include:   Labs Reviewed  CBC WITH DIFFERENTIAL/PLATELET -  Abnormal; Notable for the following components:      Result Value   RBC 2.99 (*)    Hemoglobin 8.2 (*)    HCT 25.4 (*)    Platelets 104 (*)    nRBC 0.7 (*)    Neutro Abs 0.8 (*)    Lymphs Abs 5.1 (*)    All other components within normal limits  LIPASE, BLOOD - Abnormal; Notable for the following components:   Lipase 69 (*)    All other components within normal limits  HEPATIC FUNCTION PANEL - Abnormal; Notable for the following components:   AST 112 (*)    ALT 149 (*)    Bilirubin, Direct 0.3 (*)    All other components within normal limits  PROTIME-INR - Abnormal; Notable for the following components:   Prothrombin Time 15.6 (*)    INR 1.3 (*)    All other components within normal limits  BLOOD GAS, VENOUS - Abnormal; Notable for the following components:   pH, Ven 7.47 (*)    pCO2, Ven 36 (*)    pO2, Ven <31 (*)    Acid-Base Excess 2.7 (*)    All other components within normal limits  ACETAMINOPHEN LEVEL - Abnormal; Notable for the  following components:   Acetaminophen (Tylenol), Serum <10 (*)    All other components within normal limits  SALICYLATE LEVEL - Abnormal; Notable for the following components:   Salicylate Lvl Q000111Q (*)    All other components within normal limits  RESP PANEL BY RT-PCR (RSV, FLU A&B, COVID)  RVPGX2  GROUP A STREP BY PCR  CULTURE, BLOOD (ROUTINE X 2)  CULTURE, BLOOD (ROUTINE X 2)  BASIC METABOLIC PANEL  AMMONIA  OSMOLALITY  HCG, SERUM, QUALITATIVE  LACTIC ACID, PLASMA  ETHANOL  APTT  URINALYSIS, ROUTINE W REFLEX MICROSCOPIC  LACTIC ACID, PLASMA  RAPID URINE DRUG SCREEN, HOSP PERFORMED  CBC  CBG MONITORING, ED  I-STAT CHEM 8, ED  I-STAT BETA HCG BLOOD, ED ([facility], [facility], [facility] ONLY)    Notable for as above  EKG   EKG Interpretation  Date/Time:    Ventricular Rate:    PR Interval:    QRS Duration:   QT Interval:    QTC Calculation:   R Axis:     Text Interpretation:           Imaging Studies ordered: I ordered imaging studies  including as above I independently visualized the following imaging with scope of interpretation limited to determining acute life threatening conditions related to emergency care: as above, vert dissection I independently visualized and interpreted imaging. I agree with the radiologist interpretation   Medicines ordered and prescription drug management: Meds ordered this encounter  Medications   iohexol (OMNIPAQUE) 350 MG/ML injection 100 mL   LORazepam (ATIVAN) injection 2 mg   DISCONTD: LORazepam (ATIVAN) 2 MG/ML injection    [name] A: cabinet override   sodium chloride 0.9 % bolus 1,000 mL   LORazepam (ATIVAN) injection 2 mg   LORazepam (ATIVAN) 2 MG/ML injection    [name] A: cabinet override   droperidol (INAPSINE) 2.5 MG/ML injection 1.25 mg   gadobutrol (GADAVIST) 1 MMOL/ML injection 5.5 mL   acetaminophen (TYLENOL) suppository 650 mg   acetaminophen (TYLENOL) 650 MG suppository    [place] November, [place] P: cabinet override   lactated ringers bolus 1,000 mL   LORazepam (ATIVAN) injection 2 mg   cefTRIAXone (ROCEPHIN) 2 g in sodium chloride 0.9 % 100 mL IVPB    Order Specific Question:   Antibiotic Indication:    Answer:   Meningitis    -I have reviewed the patients home medicines and have made adjustments as needed   Consultations Obtained: I requested consultation with the Dr Quinn Axe neuro,  and discussed lab and imaging findings as well as pertinent plan - they recommend: as above, admit   Cardiac Monitoring: The patient was maintained on a cardiac monitor.  I personally viewed and interpreted the cardiac monitored which showed an underlying rhythm of: sinus tachy  Social Determinants of Health:  Diagnosis or treatment significantly limited by social determinants of health: na   Reevaluation: After the interventions noted above, I reevaluated the patient and found that they have stayed the same  Co morbidities that complicate the patient  evaluation  Past Medical History:  Diagnosis Date   Anal fissure    Anemia 11/2011   iron deficiency. 01/2013 parenteral iron. Intranasal B12.  Dr Marin Olp follows.    B12 deficiency 11/2012   Breast cancer [facility])    Breast cancer metastasized to multiple sites, left ([place]) 02/02/2022   Breast mass in female 04/21/2012   Cervical cancer screening 01/27/2012   Chicken pox as a child   Crohn's disease ([place]) 2013  ileitis 10/2011. ileitis, mesenteric abscess, ? entero-enteric fistula on 08/2012 CT enterography   Dermatitis 08/22/2016   GERD (gastroesophageal reflux disease)    Hepatitis B immune 07/2012   previous vaccination.    Hives 09/2014   per Dermatologist.  Rx Zyrtec, Benadryl.    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  Despite best efforts to proofread,  errors can occur which can change the documentation meaning.    Wynona Dove A, DO 03/07/22 1625

## 2022-03-07 NOTE — ED Notes (Signed)
LP attempted. Unable to commence. Pt restless agitated, moving, despite 3 staff helping at [facility]. Did not tolerate palpation or positioning despite recent sedation.

## 2022-03-07 NOTE — ED Notes (Addendum)
Improved sedation after droperidol during/ for MRI, VSS. MRI continues.

## 2022-03-08 ENCOUNTER — Inpatient Hospital Stay (HOSPITAL_COMMUNITY): Payer: Commercial Managed Care - PPO

## 2022-03-08 ENCOUNTER — Encounter: Payer: Self-pay | Admitting: *Deleted

## 2022-03-08 DIAGNOSIS — C50919 Malignant neoplasm of unspecified site of unspecified female breast: Secondary | ICD-10-CM

## 2022-03-08 DIAGNOSIS — R945 Abnormal results of liver function studies: Secondary | ICD-10-CM

## 2022-03-08 DIAGNOSIS — Z1501 Genetic susceptibility to malignant neoplasm of breast: Secondary | ICD-10-CM

## 2022-03-08 DIAGNOSIS — Z171 Estrogen receptor negative status [ER-]: Secondary | ICD-10-CM

## 2022-03-08 DIAGNOSIS — R29818 Other symptoms and signs involving the nervous system: Secondary | ICD-10-CM

## 2022-03-08 DIAGNOSIS — I7774 Dissection of vertebral artery: Secondary | ICD-10-CM | POA: Diagnosis not present

## 2022-03-08 DIAGNOSIS — G9341 Metabolic encephalopathy: Secondary | ICD-10-CM | POA: Diagnosis not present

## 2022-03-08 DIAGNOSIS — C787 Secondary malignant neoplasm of liver and intrahepatic bile duct: Secondary | ICD-10-CM

## 2022-03-08 DIAGNOSIS — R4182 Altered mental status, unspecified: Secondary | ICD-10-CM | POA: Diagnosis not present

## 2022-03-08 DIAGNOSIS — R4701 Aphasia: Secondary | ICD-10-CM

## 2022-03-08 DIAGNOSIS — D696 Thrombocytopenia, unspecified: Secondary | ICD-10-CM | POA: Diagnosis not present

## 2022-03-08 DIAGNOSIS — C7951 Secondary malignant neoplasm of bone: Secondary | ICD-10-CM

## 2022-03-08 LAB — COMPREHENSIVE METABOLIC PANEL
ALT: 130 U/L — ABNORMAL HIGH (ref 0–44)
AST: 94 U/L — ABNORMAL HIGH (ref 15–41)
Albumin: 2.8 g/dL — ABNORMAL LOW (ref 3.5–5.0)
Alkaline Phosphatase: 73 U/L (ref 38–126)
Anion gap: 8 (ref 5–15)
BUN: 10 mg/dL (ref 6–20)
CO2: 22 mmol/L (ref 22–32)
Calcium: 8.9 mg/dL (ref 8.9–10.3)
Chloride: 104 mmol/L (ref 98–111)
Creatinine, Ser: 0.71 mg/dL (ref 0.44–1.00)
GFR, Estimated: >60 mL/min (ref >60)
Glucose, Bld: 88 mg/dL (ref 70–99)
Potassium: 4.4 mmol/L (ref 3.5–5.1)
Sodium: 134 mmol/L — ABNORMAL LOW (ref 135–145)
Total Bilirubin: 2 mg/dL — ABNORMAL HIGH (ref 0.3–1.2)
Total Protein: 5.9 g/dL — ABNORMAL LOW (ref 6.5–8.1)

## 2022-03-08 LAB — CBC WITH DIFFERENTIAL/PLATELET
Abs Immature Granulocytes: 0.03 10*3/uL (ref 0.00–0.07)
Basophils Absolute: 0 10*3/uL (ref 0.0–0.1)
Basophils Relative: 0 %
Eosinophils Absolute: 0.1 10*3/uL (ref 0.0–0.5)
Eosinophils Relative: 2 %
HCT: 26.9 % — ABNORMAL LOW (ref 36.0–46.0)
Hemoglobin: 9.2 g/dL — ABNORMAL LOW (ref 12.0–15.0)
Immature Granulocytes: 1 %
Lymphocytes Relative: 80 %
Lymphs Abs: 3.3 10*3/uL (ref 0.7–4.0)
MCH: 28 pg (ref 26.0–34.0)
MCHC: 34.2 g/dL (ref 30.0–36.0)
MCV: 82 fL (ref 80.0–100.0)
Monocytes Absolute: 0.1 10*3/uL (ref 0.1–1.0)
Monocytes Relative: 2 %
Neutro Abs: 0.6 10*3/uL — ABNORMAL LOW (ref 1.7–7.7)
Neutrophils Relative %: 15 %
Platelets: 42 10*3/uL — ABNORMAL LOW (ref 150–400)
RBC: 3.28 MIL/uL — ABNORMAL LOW (ref 3.87–5.11)
RDW: 15.7 % — ABNORMAL HIGH (ref 11.5–15.5)
WBC: 4.1 10*3/uL (ref 4.0–10.5)
nRBC: 1.7 % — ABNORMAL HIGH (ref 0.0–0.2)

## 2022-03-08 LAB — CSF CELL COUNT WITH DIFFERENTIAL
RBC Count, CSF: 4 /mm3 — ABNORMAL HIGH
RBC Count, CSF: 1 /mm3 — ABNORMAL HIGH
Tube #: 1
Tube #: 4
WBC, CSF: 1 /mm3 (ref 0–5)
WBC, CSF: 1 /mm3 (ref 0–5)

## 2022-03-08 LAB — MENINGITIS/ENCEPHALITIS PANEL (CSF)

## 2022-03-08 LAB — CYTOLOGY - NON PAP

## 2022-03-08 LAB — MAGNESIUM: Magnesium: 2.1 mg/dL (ref 1.7–2.4)

## 2022-03-08 LAB — PROTIME-INR
INR: 1.2 (ref 0.8–1.2)
Prothrombin Time: 15 seconds (ref 11.4–15.2)

## 2022-03-08 LAB — LACTATE DEHYDROGENASE: LDH: 437 U/L — ABNORMAL HIGH (ref 98–192)

## 2022-03-08 LAB — PROTEIN AND GLUCOSE, CSF
Glucose, CSF: 46 mg/dL (ref 40–70)
Total  Protein, CSF: 19 mg/dL (ref 15–45)

## 2022-03-08 MED ORDER — SODIUM CHLORIDE 0.9% IV SOLUTION: Freq: Once | INTRAVENOUS | Status: AC

## 2022-03-08 MED ORDER — LORAZEPAM 2 MG/ML IJ SOLN
1.0000 mg | Freq: Once | INTRAMUSCULAR | Status: AC
  Administered 2022-03-08: 1 mg via INTRAVENOUS
  Filled 2022-03-08: qty 1

## 2022-03-08 MED ORDER — CHLORHEXIDINE GLUCONATE CLOTH 2 % EX PADS
6.0000 | MEDICATED_PAD | Freq: Every day | CUTANEOUS | Status: DC
  Administered 2022-03-08 – 2022-03-10 (×3): 6 via TOPICAL

## 2022-03-08 NOTE — Evaluation (Signed)
Clinical/Bedside Swallow Evaluation Patient Details  Name: Carla Clark MRN: [id number] Date of Birth: 04-13-1985  Today's Date: 03/08/2022 Time: SLP Start Time (ACUTE ONLY): 0909 SLP Stop Time (ACUTE ONLY): [id number] SLP Time Calculation (min) (ACUTE ONLY): 38 min  Past Medical History:  Past Medical History:  Diagnosis Date   Anal fissure    Anemia 11/2011   iron deficiency. 01/2013 parenteral iron. Intranasal B12.  Dr Marin Olp follows.    B12 deficiency 11/2012   Breast cancer [facility])    Breast cancer metastasized to multiple sites, left ([place]) 02/02/2022   Breast mass in female 04/21/2012   Cervical cancer screening 01/27/2012   Chicken pox as a child   Crohn's disease ([place]) 2013   ileitis 10/2011. ileitis, mesenteric abscess, ? entero-enteric fistula on 08/2012 CT enterography   Dermatitis 08/22/2016   GERD (gastroesophageal reflux disease)    Hepatitis B immune 07/2012   previous vaccination.    Hives 09/2014   per Dermatologist.  Rx Zyrtec, Benadryl.    Ileitis    Serrated polyp of colon    Small bowel obstruction (HCC)    SVD (spontaneous vaginal delivery) 08/13/2018   Vitamin D deficiency 2014   Past Surgical History:  Past Surgical History:  Procedure Laterality Date   LAPAROSCOPIC RIGHT HEMI COLECTOMY  07/11/2019   WISDOM TOOTH EXTRACTION  37 yrs old   HPI:  Pt is a 37 yo female presenting to ED with AMS and L sided weakness. 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 PMH includes metastatic breast cancer (started chemo 2/8), GERD, and Crohn's disease.    Assessment / Plan / Recommendation  Clinical Impression  Pt drowsy but able to be aroused with multiple cues. Oral care provided to remove dried secretions from lips and tongue. Oral motor exam functional, with noted generalized weakness likely due to overall mentation. During trials of ice chips, thin liquids, purees, and  solids no overt s/s of dysphagia or aspiration were observed, although pt needed cueing to remain alert and attend to mastication. Expect that mentation will improve throughout the day. SLP provided education to pt's husband about safely assisting pt with feeding only while pt is alert. At bedside, pt is able to safely swallow so recommend regular diet with thin liquids and meds given with liquids or crushed in purees as mentation allows. SLP Visit Diagnosis: Dysphagia, unspecified (R13.10)    Aspiration Risk  Mild aspiration risk    Diet Recommendation Regular;Thin liquid   Liquid Administration via: Cup;Straw Medication Administration: Whole meds with liquid Supervision: Full supervision/cueing for compensatory strategies;Patient able to self feed Compensations: Minimize environmental distractions;Slow rate;Small sips/bites Postural Changes: Seated upright at 90 degrees;Remain upright for at least 30 minutes after po intake    Other  Recommendations Oral Care Recommendations: Oral care BID    Recommendations for follow up therapy are one component of a multi-disciplinary discharge planning process, led by the attending physician.  Recommendations may be updated based on patient status, additional functional criteria and insurance authorization.  Follow up Recommendations No SLP follow up      Assistance Recommended at Discharge    Functional Status Assessment Patient has had a recent decline in their functional status and demonstrates the ability to make significant improvements in function in a reasonable and predictable amount of time.  Frequency and Duration min 2x/week  1 week       Prognosis Prognosis for improved oropharyngeal function: Good  Barriers to Reach Goals: Cognitive deficits      Swallow Study   General HPI: Pt is a 37 yo female presenting to ED with AMS and L sided weakness. MRI Brain revealed findings consistent with a late subacute or chronic subdural hematoma,  concerning for meningeal involvement of tumor. PMH includes metastatic breast cancer (started chemo 2/8), GERD, and Crohn's disease. Type of Study: Bedside Swallow Evaluation Previous Swallow Assessment: none in chart Diet Prior to this Study: NPO Temperature Spikes Noted: No Respiratory Status: Room air History of Recent Intubation: No Behavior/Cognition: Lethargic/Drowsy;Requires cueing;Cooperative Oral Cavity Assessment: Dried secretions;Dry Oral Care Completed by SLP: Yes Oral Cavity - Dentition: Adequate natural dentition Vision: Functional for self-feeding Self-Feeding Abilities: Needs assist Patient Positioning: Upright in bed Baseline Vocal Quality: Normal Volitional Cough: Cognitively unable to elicit Volitional Swallow: Able to elicit    Oral/Motor/Sensory Function Overall Oral Motor/Sensory Function: Generalized oral weakness Facial ROM: Within Functional Limits Facial Symmetry: Within Functional Limits Facial Strength: Within Functional Limits Lingual ROM: Reduced right;Reduced left Lingual Symmetry: Within Functional Limits Lingual Strength: Reduced   Ice Chips Ice chips: Within functional limits Presentation: Spoon   Thin Liquid Thin Liquid: Within functional limits Presentation: Cup;Straw;Spoon    Nectar Thick Nectar Thick Liquid: Not tested   Honey Thick Honey Thick Liquid: Not tested   Puree Puree: Within functional limits Presentation: Spoon;Self Fed   Solid     Solid: Within functional limits Presentation: Elroy M., Student SLP  03/08/2022,10:58 AM

## 2022-03-08 NOTE — Progress Notes (Addendum)
LTM EEG hooked up and running - no initial skin breakdown - push button tested - [facility] monitoring.  MR compatible leads used.

## 2022-03-08 NOTE — Consult Note (Signed)
Critical care attending attestation note: I agree with Advanced Practitioner's note, impression, and recommendations as outlined. I have taken an independent interval history, reviewed the chart and examined the patient. The following reflects my medical decision making and independent critical care time   Synopsis of assessment and plan: 37 year old female with metastatic breast cancer and Crohn's disease who was brought into the emergency department with altered mental status  MRI brain is concerning for meningitis/meningeal involvement of tumor PCCM was consulted for help evaluation and possible ICU admission   Physical exam: General: Critically ill-appearing female, lying on the bed HEENT: [place]/AT, eyes anicteric.  Severely dry mucous membranes Neuro: Eyes closed, opens with vocal stimuli, not following commands, moving all 4 extremities Chest: Coarse breath sounds, no wheezes or rhonchi Heart: Regular rate and rhythm, no murmurs or gallops Abdomen: Soft, nontender, nondistended, bowel sounds present Skin: No rash  Labs and images were reviewed  Assessment and plan: Acute encephalopathy, likely septic Concern for acute meningitis Severe pancytopenia likely in the setting of recent chemotherapy Metastatic stage IV breast cancer to bones Crohn's disease  Avoid deep sedation Continue empiric meningitis coverage Will proceed with lumbar puncture Patient received platelet transfusion, will monitor platelet count Will transfuse 2 unit of PRBC for hemoglobin of 5.9 Call oncology in the morning  At this time patient does not need ICU level of care, please call with questions    Jacky Kindle, MD [facility] See Amion for pager If no response to pager, please call [phone] until 7pm After 7pm, Please call E-link [phone]   03/08/2022, 2:37 AM

## 2022-03-08 NOTE — Progress Notes (Addendum)
PROGRESS NOTE    Carla Clark  [id number] DOB: [birth year] DOA: 03/07/2022 PCP: Mosie Lukes, MD  Outpatient Specialists: oncology, GI    Brief Narrative:   Carla Clark is a 37 y.o. female with medical history significant of metastatic breast cancer and Crohn's disease presenting with AMS.  She is currently asleep and her mother provided history.  She was having epistaxis that would not stop.  Her husband took her to the ER in [place] and she was given platelets and IVF.  She was released at [place] and got home and was coherent - recognized her husband and sister.  She laid down and then got up and was trying to go to the bathroom in the kitchen, very disoriented, not making sense.  They called her oncologist and were told to bring her here.  +recent headaches, ?different than usual.  She had her first chemo treatment last Thursday at 1/3 dose.  Was diagnosed with breast cancer in December, tried to get into clinical trial but her platelets dropped and she didn't qualify.  No radiation or surgery.  She has been very tired.  Mets are in bones, she was complaining of joint pain almost a month ago.  T 100.6.  Son has strep, diagnosed Friday.  Daughter has an ear infection.   She was last seen by Dr. Marin Olp on 2/8 and he noted that her tumor was progressing rapidly and he was concerned about bone marrow involvement.  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  He noted that treatment will be tenuous given her low platelets.    She was treated that day with her first dose of paclitaxel and pembrolizumab.   She went to [facility] this AM with c/o epistaxis, noted to have have severe thrombocytopenia (16) and was transfused prior to dc.       Assessment & Plan:   Principal Problem:   Acute metabolic encephalopathy Active Problems:   Crohn's disease ([place])   Breast cancer metastasized to multiple sites, left ([place])   Thrombocytopenia ([place])  # Acute encephalopathy #  Aphasia See below. Improving  # Concern for acute encephalitis Presented febrile with altered mental statis. Meningitis/encephalitis panel negative, csf fluid culture ngtd, fluid analysis not suggestive of bacterial pneumonia - continuing vanc/mero/acyclovir, will ask ID to consult - f/u cultures  # Subdural hematoma? # Vertebral artery dissection? MR showing subdural collection most consistent w/ subacute hematoma. CTA with v2 possible dissection. Neurology following - cEEG ongoing - neuro considering repeat MRI  # Thrombocytopenia # Anemia In setting of recent start of chemotherapy. H 8.2 on arrival, 5.9 on repeat, transfused 2 units. No outward manifestation of bleeding. Discussed w/ dr. Marin Olp of oncology, he thinks the abrupt drops are 2/2 bone marrow involvement - repeat CBC pending this morning, have messaged nursing staff as it is overdue - Dr. Marin Olp advises attempting to keep plts above 75,000 given the possible subdural, thus will order 2 units of platelets given plts of 42 today. Will order irradiated given immune suppression.  # Metastatic stage 4 breast cancer Recently started taxol/pembrolizumab, xgeva. Oncology Marin Olp) following - f/u csf cytology  # Crohn's disease No signs acute disease. On skyrizi as outpt   DVT prophylaxis: SCDs Code Status: full Family Communication: husband and other family updated @ bedside  Level of care: Progressive Status is: Inpatient Remains inpatient appropriate because: severity of illness    Consultants:  Oncology, neurology, pccm, ID  Procedures: cEEG  Antimicrobials:  See above  Subjective: Groggy, no pain  Objective: Vitals:   03/08/22 0338 03/08/22 0353 03/08/22 0418 03/08/22 0747  BP: 121/72 116/62  128/69  Pulse: 92   (!) 105  Resp: 17   18  Temp: 97.9 F (36.6 C) 97.9 F (36.6 C) 98.5 F (36.9 C) 98.8 F (37.1 C)  TempSrc: Oral Oral Oral Oral  SpO2:    97%  Weight:        Intake/Output  Summary (Last 24 hours) at 03/08/2022 1043 Last data filed at 03/08/2022 0700 Gross per 24 hour  Intake 4092.24 ml  Output 300 ml  Net 3792.24 ml   Filed Weights   03/07/22 1146  Weight: 54.3 kg    Examination:  General exam: Appears calm and comfortable. Ill appearing Respiratory system: Clear to auscultation. Respiratory effort normal. Cardiovascular system: S1 & S2 heard, RRR. No JVD, murmurs, rubs, gallops or clicks. No pedal edema. Gastrointestinal system: Abdomen is nondistended, soft and nontender. No organomegaly or masses felt. Normal bowel sounds heard. Central nervous system: groggy but awake, alert, recognizes family members Extremities: Symmetric 5 x 5 power. Skin: No rashes, lesions or ulcers Psychiatry: calm    Data Reviewed: I have personally reviewed following labs and imaging studies  CBC: Recent Labs  Lab 03/03/22 0810 03/07/22 1133 03/07/22 2000 03/07/22 2156  WBC 19.9* 6.1 4.2  --   NEUTROABS 3.3 0.8*  --   --   HGB 10.0* 8.2* 5.9* 6.6*  HCT 31.1* 25.4* 18.5* 19.5*  MCV 85.4 84.9 87.3  --   PLT 28* 104* 62*  --    Basic Metabolic Panel: Recent Labs  Lab 03/03/22 0810 03/07/22 1133  NA 135 137  K 4.0 4.5  CL 102 107  CO2 23 23  GLUCOSE 121* 96  BUN 21* 15  CREATININE 0.82 0.66  CALCIUM 10.2 8.9   GFR: Estimated Creatinine Clearance: 82 mL/min (by C-G formula based on SCr of 0.66 mg/dL). Liver Function Tests: Recent Labs  Lab 03/03/22 0810 03/07/22 1133  AST 200* 112*  ALT 194* 149*  ALKPHOS 109 88  BILITOT 1.1 1.2  PROT 7.7 6.9  ALBUMIN 4.0 3.6   Recent Labs  Lab 03/07/22 1133  LIPASE 69*   Recent Labs  Lab 03/07/22 1134  AMMONIA 11   Coagulation Profile: Recent Labs  Lab 03/07/22 1133  INR 1.3*   Cardiac Enzymes: No results for input(s): "CKTOTAL", "CKMB", "CKMBINDEX", "TROPONINI" in the last 168 hours. BNP (last 3 results) No results for input(s): "PROBNP" in the last 8760 hours. HbA1C: No results for  input(s): "HGBA1C" in the last 72 hours. CBG: Recent Labs  Lab 03/07/22 1151  GLUCAP 93   Lipid Profile: No results for input(s): "CHOL", "HDL", "LDLCALC", "TRIG", "CHOLHDL", "LDLDIRECT" in the last 72 hours. Thyroid Function Tests: No results for input(s): "TSH", "T4TOTAL", "FREET4", "T3FREE", "THYROIDAB" in the last 72 hours. Anemia Panel: No results for input(s): "VITAMINB12", "FOLATE", "FERRITIN", "TIBC", "IRON", "RETICCTPCT" in the last 72 hours. Urine analysis:    Component Value Date/Time   COLORURINE STRAW (A) 03/07/2022 1630   APPEARANCEUR CLEAR 03/07/2022 1630   LABSPEC 1.031 (H) 03/07/2022 1630   PHURINE 7.0 03/07/2022 1630   GLUCOSEU NEGATIVE 03/07/2022 1630   GLUCOSEU NEGATIVE 08/24/2017 1211   HGBUR NEGATIVE 03/07/2022 1630   BILIRUBINUR NEGATIVE 03/07/2022 1630   BILIRUBINUR N 08/18/2015 1554   KETONESUR NEGATIVE 03/07/2022 1630   PROTEINUR NEGATIVE 03/07/2022 1630   UROBILINOGEN 0.2 08/24/2017 1211   NITRITE NEGATIVE 03/07/2022 1630   LEUKOCYTESUR  NEGATIVE 03/07/2022 1630   Sepsis Labs: @LABRCNTIP$ (procalcitonin:4,lacticidven:4)  ) Recent Results (from the past 240 hour(s))  Resp panel by RT-PCR (RSV, Flu A&B, Covid) Anterior Nasal Swab     Status: None   Collection Time: 03/07/22 11:53 AM   Specimen: Anterior Nasal Swab  Result Value Ref Range Status   SARS Coronavirus 2 by RT PCR NEGATIVE NEGATIVE Final    Comment: (NOTE) SARS-CoV-2 target nucleic acids are NOT DETECTED.  The SARS-CoV-2 RNA is generally detectable in upper respiratory specimens during the acute phase of infection. The lowest concentration of SARS-CoV-2 viral copies this assay can detect is 138 copies/mL. A negative result does not preclude SARS-Cov-2 infection and should not be used as the sole basis for treatment or other patient management decisions. A negative result may occur with  improper specimen collection/handling, submission of specimen other than nasopharyngeal swab,  presence of viral mutation(s) within the areas targeted by this assay, and inadequate number of viral copies(<138 copies/mL). A negative result must be combined with clinical observations, patient history, and epidemiological information. The expected result is Negative.  Fact Sheet for Patients:  EntrepreneurPulse.com.au  Fact Sheet for Healthcare Providers:  IncredibleEmployment.be  This test is no t yet approved or cleared by the [place] FDA and  has been authorized for detection and/or diagnosis of SARS-CoV-2 by FDA under an Emergency Use Authorization (EUA). This EUA will remain  in effect (meaning this test can be used) for the duration of the COVID-19 declaration under Section 564(b)(1) of the Act, 21 U.S.C.section 360bbb-3(b)(1), unless the authorization is terminated  or revoked sooner.       Influenza A by PCR NEGATIVE NEGATIVE Final   Influenza B by PCR NEGATIVE NEGATIVE Final    Comment: (NOTE) The Xpert Xpress SARS-CoV-2/FLU/RSV plus assay is intended as an aid in the diagnosis of influenza from Nasopharyngeal swab specimens and should not be used as a sole basis for treatment. Nasal washings and aspirates are unacceptable for Xpert Xpress SARS-CoV-2/FLU/RSV testing.  Fact Sheet for Patients: EntrepreneurPulse.com.au  Fact Sheet for Healthcare Providers: IncredibleEmployment.be  This test is not yet approved or cleared by the [place] FDA and has been authorized for detection and/or diagnosis of SARS-CoV-2 by FDA under an Emergency Use Authorization (EUA). This EUA will remain in effect (meaning this test can be used) for the duration of the COVID-19 declaration under Section 564(b)(1) of the Act, 21 U.S.C. section 360bbb-3(b)(1), unless the authorization is terminated or revoked.     Resp Syncytial Virus by PCR NEGATIVE NEGATIVE Final    Comment: (NOTE) Fact Sheet for  Patients: EntrepreneurPulse.com.au  Fact Sheet for Healthcare Providers: IncredibleEmployment.be  This test is not yet approved or cleared by the [place] FDA and has been authorized for detection and/or diagnosis of SARS-CoV-2 by FDA under an Emergency Use Authorization (EUA). This EUA will remain in effect (meaning this test can be used) for the duration of the COVID-19 declaration under Section 564(b)(1) of the Act, 21 U.S.C. section 360bbb-3(b)(1), unless the authorization is terminated or revoked.  Performed at [facility], [place] [address], [place], [place] [address]   Group A Strep by PCR     Status: None   Collection Time: 03/07/22  4:30 PM   Specimen: Throat; Sterile Swab  Result Value Ref Range Status   Group A Strep by PCR NOT DETECTED NOT DETECTED Final    Comment: Performed at [facility], [place] [address], [place], [place] [address]  Blood  culture (routine x 2)     Status: None (Preliminary result)   Collection Time: 03/07/22  4:50 PM   Specimen: BLOOD  Result Value Ref Range Status   Specimen Description   Final    BLOOD RIGHT ANTECUBITAL Performed at [facility] [address], [place], [place] [address]    Special Requests   Final    BOTTLES DRAWN AEROBIC AND ANAEROBIC Blood Culture adequate volume Performed at [facility] [address] [place], [place] [address]    Culture   Final    NO GROWTH < 24 HOURS Performed at [facility] [address] [place], [place] [address]    Report Status PENDING  Incomplete  Blood culture (routine x 2)     Status: None (Preliminary result)   Collection Time: 03/07/22  9:59 PM   Specimen: BLOOD  Result Value Ref Range Status   Specimen Description BLOOD RIGHT ANTECUBITAL  Final   Special Requests   Final    BOTTLES DRAWN AEROBIC AND ANAEROBIC Blood Culture results may not be optimal due to an  inadequate volume of blood received in culture bottles   Culture   Final    NO GROWTH < 12 HOURS Performed at [facility], [place] [address], [place], [place] [address]    Report Status PENDING  Incomplete  CSF culture w Gram Stain     Status: None (Preliminary result)   Collection Time: 03/07/22 11:03 PM   Specimen: CSF; Cerebrospinal Fluid  Result Value Ref Range Status   Specimen Description CSF  Final   Special Requests NONE  Final   Gram Stain   Final    NO WBC SEEN NO ORGANISMS SEEN CYTOSPIN SMEAR Performed at [facility], [place] [address] [place],  [address]    Culture PENDING  Incomplete   Report Status PENDING  Incomplete         Radiology Studies: Overnight EEG with video  Result Date: 03/08/2022 Lora Havens, MD     03/08/2022  9:09 AM Patient Name: Carla Clark MRN: [id number] Epilepsy Attending: Lora Havens Referring Physician/Provider: Donnetta Simpers, MD Duration: 03/08/2022 0105 to 0900 Patient history:  37 y.o. female with aggressive stage 4 breast cancer with no known brain mets recently started chemo with [place] and paclitaxel who presented with confusion and aphasia. EEG to evaluate for seizure. Level of alertness: Awake, asleep AEDs during EEG study: None Technical aspects: This EEG study was done with scalp electrodes positioned according to the 10-20 International system of electrode placement. Electrical activity was reviewed with band pass filter of 1-70Hz$ , sensitivity of 7 uV/mm, display speed of 60m/sec with a 60Hz$  notched filter applied as appropriate. EEG data were recorded continuously and digitally stored.  Video monitoring was available and reviewed as appropriate. Description: The posterior dominant rhythm consists of 8 Hz activity of moderate voltage (25-35 uV) seen predominantly in posterior head regions, symmetric and reactive to eye opening and eye closing. Sleep was characterized by vertex waves, sleep spindles (12 to  14 Hz), maximal frontocentral region. EEG showed continuous generalized 3 to 6 Hz theta-delta slowing in right temporal region. Intermittent generalized 3-5hz$  theta-delta slowing was also noted. Hyperventilation and photic stimulation were not performed.   ABNORMALITY - Continuous slow, right temporal region - Intermittent slow, generalized IMPRESSION: This study is suggestive of cortical dysfunction arising from right temporal region likely secondary to underlying structural abnormality. Additionally there is mild diffuse encephalopathy,  nonspecific etiology. No seizures or epileptiform discharges were seen throughout the recording. Lora Havens   MR BRAIN W WO CONTRAST  Result Date: 03/07/2022 CLINICAL DATA:  Transient ischemic attack. Stroke. Altered mental status. Abnormal left vertebral artery. EXAM: MRI HEAD WITHOUT AND WITH CONTRAST TECHNIQUE: Multiplanar, multiecho pulse sequences of the brain and surrounding structures were obtained without and with intravenous contrast. CONTRAST:  5.38m GADAVIST GADOBUTROL 1 MMOL/ML IV SOLN COMPARISON:  CT studies earlier same day. FINDINGS: Brain: Diffusion imaging does not show any acute or subacute infarction. No focal abnormality is seen affecting the brainstem or cerebellum. The cerebral hemispheres appear normal without evidence of cortical or large vessel stroke or metastatic lesion. Patient has a right-sided holo convexity subdural collection measuring 2.5 mm in thickness, not visible on the CT scan. This is most consistent with a late subacute or chronic subdural collection. The postcontrast imaging is degraded by motion but I do not see any sign of dural enhancement. Whereas it is not possible to exclude dural tumor in this case, I do not have any positive evidence for that. If we were dealing with dural metastatic disease and might expect some finding on the left. No hydrocephalus.  No midline shift Vascular: Major vessels at the base of the brain show  flow. Skull and upper cervical spine: No osseous disease is discernible in the region of the calvarium, skull base or upper cervical spine. Sinuses/Orbits: Clear/normal Other: None IMPRESSION: 1. No evidence of acute stroke or parenchymal metastatic disease. 2. Right-sided holo convexity subdural collection measuring 2.5 mm in thickness, not visible on the CT scan. 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 I do not see any sign of dural enhancement, though the postcontrast imaging is markedly degraded by motion. Whereas it is not possible to exclude dural tumor in this case given the imaging we have, I do not have any positive evidence for that. If we were dealing with dural metastatic disease I might also expect some finding on the left. One could consider repeating the postcontrast imaging when the patient is in a condition that would allow motion free scanning. 3. Case discussed with Dr. SQuinn Axeat about 1545 hours. Electronically Signed   By: MNelson ChimesM.D.   On: 03/07/2022 15:55   MR ANGIO NECK W WO CONTRAST  Result Date: 03/07/2022 CLINICAL DATA:  Neuro deficit, acute, stroke suspected. Possible vertebral occlusion on CT angiography of the neck. History of metastatic breast cancer. EXAM: MRA NECK WITHOUT AND WITH CONTRAST TECHNIQUE: Multiplanar and multiecho pulse sequences of the neck were obtained without and with intravenous contrast. Angiographic images of the neck were obtained using MRA technique without and with intravenous contrast. CONTRAST:  5.565mGADAVIST GADOBUTROL 1 MMOL/ML IV SOLN COMPARISON:  CT angiography same day FINDINGS: Contrast enhanced portion of the examination is markedly degraded by motion. Precontrast time-of-flight imaging is better quality. Unfortunately, the technologist did not perform the requested T1 axial fat-sat imaging. None the less, in evaluating the time-of-flight imaging, I think there is convincing evidence that there is a left  vertebral artery dissection. Maximal stenosis of the true lumen in that region is about 40%, to estimation. Otherwise, the findings in the region are normal. The carotid bifurcation regions are normal. The right vertebral artery is normal. IMPRESSION: 1. Motion degraded contrast enhanced portion of the study. 2. Precontrast T1 fat saturated axial imaging was not done as requested. However, the left vertebral artery does show a fairly convincing dissection  with maximal stenosis of about 40%., based on the axial time-of-flight imaging. This extends from the level of about C7 to the level of about C4. 3. Normal right vertebral artery. 4. Normal carotid bifurcation regions. Electronically Signed   By: Nelson Chimes M.D.   On: 03/07/2022 15:30   CT ANGIO HEAD NECK W WO CM W PERF (CODE STROKE)  Addendum Date: 03/07/2022   ADDENDUM REPORT: 03/07/2022 15:26 ADDENDUM: CT perfusion was also performed. This demonstrates reported small (12 mL) of penumbra which may be artifactual given located in multiple vascular territories and also in the inferior temporal lobes which is often artifactual. An MRI could better assess for acute infarct. No evidence of core infarct by CT perfusion. Electronically Signed   By: Margaretha Sheffield M.D.   On: 03/07/2022 15:26   Result Date: 03/07/2022 CLINICAL DATA:  Neuro deficit, acute, stroke suspected EXAM: CT ANGIOGRAPHY HEAD AND NECK TECHNIQUE: Multidetector CT imaging of the head and neck was performed using the standard protocol during bolus administration of intravenous contrast. Multiplanar CT image reconstructions and MIPs were obtained to evaluate the vascular anatomy. Carotid stenosis measurements (when applicable) are obtained utilizing NASCET criteria, using the distal internal carotid diameter as the denominator. RADIATION DOSE REDUCTION: This exam was performed according to the departmental dose-optimization program which includes automated exposure control, adjustment of the  mA and/or kV according to patient size and/or use of iterative reconstruction technique. CONTRAST:  140m OMNIPAQUE IOHEXOL 350 MG/ML SOLN COMPARISON:  None Available. FINDINGS: CTA NECK FINDINGS Aortic arch: Great vessel origins are patent without significant stenosis. Right carotid system: No evidence of dissection, stenosis (50% or greater), or occlusion. Left carotid system: No evidence of dissection, stenosis (50% or greater), or occlusion. Vertebral arteries: Luminal irregularity and eccentric soft tissue attenuation in the left V2 vertebral artery from C4-C5 to C6-C7, which is concerning for dissection and possibly intramural hematoma (for example see series 9, image 118; series 7, images 242 through 192). Right vertebral artery is patent without significant stenosis or evidence of dissection. Skeleton: Extensive osseous metastatic disease. Other neck: No acute findings on limited assessment. Upper chest: Visualized lung apices are clear. Please see prior PET-CT for evaluation of adenopathy. Review of the MIP images confirms the above findings CTA HEAD FINDINGS Anterior circulation: Bilateral intracranial ICAs, MCAs, and ACAs are patent without proximal hemodynamically significant stenosis Posterior circulation: Bilateral intradural vertebral arteries, basilar artery and bilateral posterior cerebral arteries are patent without proximal hemodynamically significant stenosis. Venous sinuses: As permitted by contrast timing, patent. Review of the MIP images confirms the above findings IMPRESSION: 1. 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 Resulting moderate to severe stenosis of the vertebral artery. An MRA of the neck with axial T1 fat saturation may be helpful to further evaluate if clinically warranted. 2. Extensive osseous metastatic disease. Findings discussed with Dr. SQuinn Axevia telephone at 12:55 p.m. Electronically Signed: By: FMargaretha SheffieldM.D. On:  03/07/2022 13:03   DG Chest Portable 1 View  Result Date: 03/07/2022 CLINICAL DATA:  Breast cancer EXAM: PORTABLE CHEST 1 VIEW COMPARISON:  PET-CT 01/21/2022 FINDINGS: Right-sided Port-A-Cath with the tip projecting over the SVC. No focal consolidation. No pleural effusion or pneumothorax. Heart and mediastinal contours are unremarkable. No acute osseous abnormality. IMPRESSION: No active disease. Electronically Signed   By: HKathreen DevoidM.D.   On: 03/07/2022 13:44   CT HEAD CODE STROKE WO CONTRAST  Result Date: 03/07/2022 CLINICAL DATA:  Code stroke.  Neuro deficit, acute, stroke suspected EXAM: CT HEAD WITHOUT CONTRAST TECHNIQUE: Contiguous axial images were obtained from the base of the skull through the vertex without intravenous contrast. RADIATION DOSE REDUCTION: This exam was performed according to the departmental dose-optimization program which includes automated exposure control, adjustment of the mA and/or kV according to patient size and/or use of iterative reconstruction technique. COMPARISON:  None Available. FINDINGS: Brain: No evidence of acute large vascular territory infarction, hemorrhage, hydrocephalus, extra-axial collection or mass lesion/mass effect. Vascular: No hyperdense vessel identified. Skull: No acute fracture. Sinuses/Orbits: Clear sinuses.  No acute orbital findings. Other: No mastoid effusions. ASPECTS [facility] Stroke Program Early CT Score) total score (0-10 with 10 being normal): 10. IMPRESSION: 1. No evidence of acute intracranial abnormality. 2. ASPECTS is 10. Code stroke imaging results were communicated on 03/07/2022 at 12:32 pm to provider Pearline Cables via telephone, who verbally acknowledged these results. Electronically Signed   By: Margaretha Sheffield M.D.   On: 03/07/2022 12:33        Scheduled Meds:  docusate sodium  100 mg Oral BID   sodium chloride flush  3 mL Intravenous Q12H   Continuous Infusions:  sodium chloride 125 mL/hr at 03/08/22 0732   acyclovir  (ZOVIRAX) 500 mg in dextrose 5 % 100 mL IVPB 500 mg (03/08/22 0657)   meropenem (MERREM) IV 2 g (03/08/22 [id number])   vancomycin 500 mg (03/08/22 0948)     LOS: 1 day     Desma Maxim, MD [facility]   If 7PM-7AM, please contact night-coverage [email or web address] Password [facility] 03/08/2022, 10:43 AM

## 2022-03-08 NOTE — Consult Note (Signed)
Carla Clark is well-known to me.  She is a very charming 37 year old white female.  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  She has extensive bone disease.  I suspect that she also has hepatic disease given the rapidly increasing LFTs and her high LDH.  We did start her on chemotherapy last week.  She received Taxol/Keytruda.  I think she received this on 03/03/2022.  She did have a nosebleed I think over the weekend.  She had a low platelet count.  I think she was given some platelets.  Yesterday, her husband called the office.  He told her that she was very confused.  She tried to go to the bathroom in the kitchen.  She was sleeping on the stairs.  She then became somewhat aphasic.  He got her to the [facility].  She had an MRI of the brain that was done, ultimately,.  This showed a vertebral artery dissection.  There is no obvious CNS metastasis.  There is no obvious stroke.  I was not aware that so like this existed.  She had lab work that was done.  Her liver function studies actually look a little better.  Her ammonia was only 11.  Her calcium was 8.9 with an albumin of 3.6.  Her sodium was 137.  She had a white cell count of 4.2.  Hemoglobin 5.9.  Platelet count was 62,000.  She was transferred over to [facility].  She did have a lumbar puncture done.  The fluid was clear.  She only had 1 white blood cell.  The glucose was 46.  The protein was 19.  She currently is on EEG.  She was not responsive.  She does not talk to me.  She is somewhat lethargic.  This may be from some sedation that she had.  She had blood cultures taken.  She had a urinalysis which was unremarkable.  I think she is on empiric antibiotics.  I would not think that there would be any meningeal carcinomatosis given the results of the LP.  I am not aware of any studies or literature that would suggest that treatment would be involved with a vertebral artery dissection.  As far  as her husband knows, she has never fallen.  She has had no problems with blood pressure.  She has had no issues with nausea or vomiting.  I think she tolerated treatment quite nicely.  On physical exam, temperature is 98.5.  Pulse 92.  Blood pressure 116/62.  Oxygen saturation 100%.  Her head neck exam shows no obvious ocular or oral lesions.  Her lungs sound clear bilaterally.  She has good air movement bilaterally.  Cardiac exam regular rate and rhythm.  She has no murmurs.  Abdomen is soft.  Bowel sounds are present.  There is no obvious fluid wave.  There may be a liver edge at the right costal margin.  Extremities shows no clubbing, cyanosis or edema.  She does move her extremities okay.  Neurological exam shows global lethargy.  Again she would is not talk.  She opens her eyes a little bit.   Carla Clark has a very aggressive breast cancer.  This is triple negative.  Of note, she is BRCA positive.  I am still not sure as to what the actual etiology of this neurological decline is.  I suppose that a vertebral artery dissection is always possibility.  Again I am not aware of any association between this and malignancy.  We will still be interesting to see what the spinal fluid has to show with respect to meningeal carcinomatosis.  I know that Neurology is going to follow her very closely.  I think that they will take the "lead" in this situation.  We will have to monitor her blood counts.  Will have to monitor her LFTs.  She is getting blood right now.  I am just absolutely stun by the turn of events.  She really looked pretty decent when we treated her last week.  I know that she will get incredible care from everybody upon 4NP.  [name], MD  2 Cor 3:12

## 2022-03-08 NOTE — Progress Notes (Signed)
Subjective: She is waking up some, but still not back to baseline  Exam: Vitals:   03/08/22 1149 03/08/22 1425  BP: 119/77   Pulse: 90 98  Resp: 17 17  Temp: 98.5 F (36.9 C) 99.6 F (37.6 C)  SpO2: 98% 98%   Gen: In bed, NAD Resp: non-labored breathing, no acute distress Abd: soft, nt  Neuro: MS: She is awake, able to follow some commands, though she does make some errors.  For example when speech therapy asked her to open her mouth she protrudes her tongue.  She is able to tell me her name and count fingers with repeated prompting. CN: Visual fields full, EOMI, face symmetric Motor: Moves all extremities relatively symmetrically, gives poor effort but no clear focal weakness Sensory: Reports sensation bilaterally  Pertinent Labs: Results for orders placed or performed during the hospital encounter of 03/07/22 (from the past 24 hour(s))  Urinalysis, Routine w reflex microscopic -Urine, Clean Catch     Status: Abnormal   Collection Time: 03/07/22  4:30 PM  Result Value Ref Range   Color, Urine STRAW (A) YELLOW   APPearance CLEAR CLEAR   Specific Gravity, Urine 1.031 (H) 1.005 - 1.030   pH 7.0 5.0 - 8.0   Glucose, UA NEGATIVE NEGATIVE mg/dL   Hgb urine dipstick NEGATIVE NEGATIVE   Bilirubin Urine NEGATIVE NEGATIVE   Ketones, ur NEGATIVE NEGATIVE mg/dL   Protein, ur NEGATIVE NEGATIVE mg/dL   Nitrite NEGATIVE NEGATIVE   Leukocytes,Ua NEGATIVE NEGATIVE  Lactic acid, plasma     Status: None   Collection Time: 03/07/22  4:30 PM  Result Value Ref Range   Lactic Acid, Venous 1.2 0.5 - 1.9 mmol/L  Urine rapid drug screen (hosp performed)     Status: None   Collection Time: 03/07/22  4:30 PM  Result Value Ref Range   Opiates NONE DETECTED NONE DETECTED   Cocaine NONE DETECTED NONE DETECTED   Benzodiazepines NONE DETECTED NONE DETECTED   Amphetamines NONE DETECTED NONE DETECTED   Tetrahydrocannabinol NONE DETECTED NONE DETECTED   Barbiturates NONE DETECTED NONE DETECTED   Group A Strep by PCR     Status: None   Collection Time: 03/07/22  4:30 PM   Specimen: Throat; Sterile Swab  Result Value Ref Range   Group A Strep by PCR NOT DETECTED NOT DETECTED  Blood culture (routine x 2)     Status: None (Preliminary result)   Collection Time: 03/07/22  4:50 PM   Specimen: BLOOD  Result Value Ref Range   Specimen Description      BLOOD RIGHT ANTECUBITAL Performed at [facility], [place] [address], [place], [place] [address]    Special Requests      BOTTLES DRAWN AEROBIC AND ANAEROBIC Blood Culture adequate volume Performed at [facility] [address] [place], [place] [address]    Culture      NO GROWTH < 24 HOURS Performed at [facility] [address], [place], [place] [address]    Report Status PENDING   CBC     Status: Abnormal   Collection Time: 03/07/22  8:00 PM  Result Value Ref Range   WBC 4.2 4.0 - 10.5 K/uL   RBC 2.12 (L) 3.87 - 5.11 MIL/uL   Hemoglobin 5.9 (LL) 12.0 - 15.0 g/dL   HCT 18.5 (L) 36.0 - 46.0 %   MCV 87.3 80.0 - 100.0 fL   MCH 27.8 26.0 - 34.0 pg   MCHC 31.9 30.0 -  36.0 g/dL   RDW 14.7 11.5 - 15.5 %   Platelets 62 (L) 150 - 400 K/uL   nRBC 1.0 (H) 0.0 - 0.2 %  HIV Antibody (routine testing w rflx)     Status: None   Collection Time: 03/07/22  9:56 PM  Result Value Ref Range   HIV Screen 4th Generation wRfx Non Reactive Non Reactive  Hemoglobin and hematocrit, blood     Status: Abnormal   Collection Time: 03/07/22  9:56 PM  Result Value Ref Range   Hemoglobin 6.6 (LL) 12.0 - 15.0 g/dL   HCT 19.5 (L) 36.0 - 46.0 %  Type and screen [place]     Status: None (Preliminary result)   Collection Time: 03/07/22  9:57 PM  Result Value Ref Range   ABO/RH(D) B POS    Antibody Screen NEG    Sample Expiration 03/10/2022,2359    Unit Number [id number]    Blood Component Type RED CELLS,LR    Unit division 00    Status of Unit ISSUED    Transfusion Status OK TO  TRANSFUSE    Crossmatch Result      Compatible Performed at [facility], [address], [place], [place] [address]    Unit Number [id number]    Blood Component Type RED CELLS,LR    Unit division 00    Status of Unit ISSUED    Transfusion Status OK TO TRANSFUSE    Crossmatch Result Compatible   Blood culture (routine x 2)     Status: None (Preliminary result)   Collection Time: 03/07/22  9:59 PM   Specimen: BLOOD  Result Value Ref Range   Specimen Description BLOOD RIGHT ANTECUBITAL    Special Requests      BOTTLES DRAWN AEROBIC AND ANAEROBIC Blood Culture results may not be optimal due to an inadequate volume of blood received in culture bottles   Culture      NO GROWTH < 12 HOURS Performed at [facility] [address], [place], [place] [address]    Report Status PENDING   Prepare RBC (crossmatch)     Status: None   Collection Time: 03/07/22 10:46 PM  Result Value Ref Range   Order Confirmation      ORDER PROCESSED BY BLOOD BANK Performed at [facility], [place] [address], [place], [place] [address]   CSF culture w Gram Stain     Status: None (Preliminary result)   Collection Time: 03/07/22 11:03 PM   Specimen: CSF; Cerebrospinal Fluid  Result Value Ref Range   Specimen Description CSF    Special Requests NONE    Gram Stain      NO WBC SEEN NO ORGANISMS SEEN CYTOSPIN SMEAR Performed at [facility], [place] [address], [place], [place] [address]    Culture PENDING    Report Status PENDING   CSF cell count with differential collection tube #: 1     Status: Abnormal   Collection Time: 03/07/22 11:10 PM  Result Value Ref Range   Tube # 1    Color, CSF COLORLESS COLORLESS   Appearance, CSF CLEAR (A) CLEAR   Supernatant NOT INDICATED    RBC Count, CSF 4 (H) 0 /cu mm   WBC, CSF 1 0 - 5 /cu mm   Other Cells, CSF TOO FEW TO COUNT, SMEAR AVAILABLE FOR REVIEW   CSF cell count with differential collection tube #: 4     Status: Abnormal  Collection Time: 03/07/22 11:10 PM  Result Value Ref Range   Tube # 4    Color, CSF COLORLESS COLORLESS   Appearance, CSF CLEAR (A) CLEAR   Supernatant NOT INDICATED    RBC Count, CSF 1 (H) 0 /cu mm   WBC, CSF 1 0 - 5 /cu mm   Other Cells, CSF TOO FEW TO COUNT, SMEAR AVAILABLE FOR REVIEW   Protein and glucose, CSF     Status: None   Collection Time: 03/07/22 11:10 PM  Result Value Ref Range   Glucose, CSF 46 40 - 70 mg/dL   Total  Protein, CSF 19 15 - 45 mg/dL  Meningitis/Encephalitis Panel (CSF)     Status: None   Collection Time: 03/07/22 11:10 PM  Result Value Ref Range   Cryptococcus neoformans/gattii (CSF) NOT DETECTED NOT DETECTED   Cytomegalovirus (CSF) NOT DETECTED NOT DETECTED   Enterovirus (CSF) NOT DETECTED NOT DETECTED   Escherichia coli K1 (CSF) NOT DETECTED NOT DETECTED   Haemophilus influenzae (CSF) NOT DETECTED NOT DETECTED   Herpes simplex virus 1 (CSF) NOT DETECTED NOT DETECTED   Herpes simplex virus 2 (CSF) NOT DETECTED NOT DETECTED   Human herpesvirus 6 (CSF) NOT DETECTED NOT DETECTED   Human parechovirus (CSF) NOT DETECTED NOT DETECTED   Listeria monocytogenes (CSF) NOT DETECTED NOT DETECTED   Neisseria meningitis (CSF) NOT DETECTED NOT DETECTED   Streptococcus agalactiae (CSF) NOT DETECTED NOT DETECTED   Streptococcus pneumoniae (CSF) NOT DETECTED NOT DETECTED   Varicella zoster virus (CSF) NOT DETECTED NOT DETECTED  Cytology - Non PAP; Cerebrospinal fluid     Status: None   Collection Time: 03/07/22 11:30 PM  Result Value Ref Range   CYTOLOGY - NON GYN      CYTOLOGY - NON PAP CASE: [id number] PATIENT: Carla Clark Non-Gynecological Cytology Report     Clinical History: None provided Specimen Submitted:  A. CEREBRAL SPINAL FLUID, SPINAL TAP:   FINAL MICROSCOPIC DIAGNOSIS: - No malignant cells identified - Scattered rare lymphocytes and monocytes within a proteinaceous background  SPECIMEN ADEQUACY: Satisfactory for  evaluation  GROSS: Received is/are: 2cc's of clear, colorless fluid. (MW:mw) Smears: 0 Concentration Method (Thin Prep):1 Cell Block: 0 Additional Studies:N/A     Final Diagnosis performed by Tobin Chad, MD.   Electronically signed 03/08/2022 Technical and / or Professional components performed at [facility], [place] [address], [place], [place] [address].  Immunohistochemistry Technical component (if applicable) was performed at [facility]. [address] [place], [place], [place] [address].   IMMUNOHISTOCHEMISTRY DISCLAIMER (if applicable): Some o f these immunohistochemical stains may have been developed and the performance characteristics determine by [facility]. Some may not have been cleared or approved by the U.S. Food and Drug Administration. The FDA has determined that such clearance or approval is not necessary. This test is used for clinical purposes. It should not be regarded as investigational or for research. This laboratory is certified under the [place] (CLIA-88) as qualified to perform high complexity clinical laboratory testing.  The controls stained appropriately.   CBC with Differential/Platelet     Status: Abnormal   Collection Time: 03/08/22  5:00 AM  Result Value Ref Range   WBC 4.1 4.0 - 10.5 K/uL   RBC 3.28 (L) 3.87 - 5.11 MIL/uL   Hemoglobin 9.2 (L) 12.0 - 15.0 g/dL   HCT 26.9 (L) 36.0 - 46.0 %   MCV 82.0 80.0 - 100.0 fL  MCH 28.0 26.0 - 34.0 pg   MCHC 34.2 30.0 - 36.0 g/dL   RDW 15.7 (H) 11.5 - 15.5 %   Platelets 42 (L) 150 - 400 K/uL   nRBC 1.7 (H) 0.0 - 0.2 %   Neutrophils Relative % 15 %   Neutro Abs 0.6 (L) 1.7 - 7.7 K/uL   Lymphocytes Relative 80 %   Lymphs Abs 3.3 0.7 - 4.0 K/uL   Monocytes Relative 2 %   Monocytes Absolute 0.1 0.1 - 1.0 K/uL   Eosinophils Relative 2 %   Eosinophils Absolute 0.1 0.0 - 0.5 K/uL   Basophils Relative 0 %    Basophils Absolute 0.0 0.0 - 0.1 K/uL   RBC Morphology MORPHOLOGY UNREMARKABLE    Smear Review MORPHOLOGY UNREMARKABLE    Immature Granulocytes 1 %   Abs Immature Granulocytes 0.03 0.00 - 0.07 K/uL   Reactive, Benign Lymphocytes PRESENT   Comprehensive metabolic panel     Status: Abnormal   Collection Time: 03/08/22  5:00 AM  Result Value Ref Range   Sodium 134 (L) 135 - 145 mmol/L   Potassium 4.4 3.5 - 5.1 mmol/L   Chloride 104 98 - 111 mmol/L   CO2 22 22 - 32 mmol/L   Glucose, Bld 88 70 - 99 mg/dL   BUN 10 6 - 20 mg/dL   Creatinine, Ser 0.71 0.44 - 1.00 mg/dL   Calcium 8.9 8.9 - 10.3 mg/dL   Total Protein 5.9 (L) 6.5 - 8.1 g/dL   Albumin 2.8 (L) 3.5 - 5.0 g/dL   AST 94 (H) 15 - 41 U/L   ALT 130 (H) 0 - 44 U/L   Alkaline Phosphatase 73 38 - 126 U/L   Total Bilirubin 2.0 (H) 0.3 - 1.2 mg/dL   GFR, Estimated >60 >60 mL/min   Anion gap 8 5 - 15  Magnesium     Status: None   Collection Time: 03/08/22  5:00 AM  Result Value Ref Range   Magnesium 2.1 1.7 - 2.4 mg/dL  Lactate dehydrogenase     Status: Abnormal   Collection Time: 03/08/22  8:00 AM  Result Value Ref Range   LDH 437 (H) 98 - 192 U/L  Protime-INR     Status: None   Collection Time: 03/08/22 11:49 AM  Result Value Ref Range   Prothrombin Time 15.0 11.4 - 15.2 seconds   INR 1.2 0.8 - 1.2  Prepare platelet pheresis     Status: None (Preliminary result)   Collection Time: 03/08/22 12:56 PM  Result Value Ref Range   Unit Number [id number]    Blood Component Type PLTP1 PSORALEN TREATED    Unit division 00    Status of Unit ISSUED    Transfusion Status      OK TO TRANSFUSE Performed at [facility], [address] [place], [place] [address]    Unit Number [id number]    Blood Component Type PLTP1 PSORALEN TREATED    Unit division 00    Status of Unit ALLOCATED    Transfusion Status OK TO TRANSFUSE      Impression: 37 year old female with acute onset of confusion in the setting of significant blood  loss.  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  With no pleocytosis or other evidence of infection on LP, I think that an infectious etiology is extremely low.  With no signs of enhancement, I think that metastatic disease is less likely, though not  entirely excluded.  One possibility, no matter what the etiology of her MRI finding, would be seizure as etiology of her acute presentation, but no findings suggestive of seizure predisposition on EEG.  Her current postictal state would be very prolonged, but she is making progress.  Another possibility would be cerebral dysfunction due to acute blood loss, which would explain her improvement following transfusion.  I would have expected more hypotension in that scenario, but still feel that it is possible.  Paraneoplastic etiology could also be a consideration, but with her improvement think that this is also less likely.  Recommendations: 1) follow-up CSF cytology 2) continue LTM EEG for now. 3) neurology will continue to follow  Roland Rack, MD [facility] [phone]  If 7pm- 7am, please page neurology on call as listed in [place].

## 2022-03-08 NOTE — Progress Notes (Signed)
vLTM maintenance   All impedances below 10kohms.  No skin breakdown noted at all skin sites 

## 2022-03-08 NOTE — Procedures (Signed)
Patient Name: Carla Clark  MRN: [id number]  Epilepsy Attending: Lora Havens  Referring Physician/Provider: Donnetta Simpers, MD  Duration: 03/08/2022 0105 to 03/09/2022 0105  Patient history:  37 y.o. female with aggressive stage 4 breast cancer with no known brain mets recently started chemo with [place] and paclitaxel who presented with confusion and aphasia. EEG to evaluate for seizure.  Level of alertness: Awake, asleep  AEDs during EEG study: None  Technical aspects: This EEG study was done with scalp electrodes positioned according to the 10-20 International system of electrode placement. Electrical activity was reviewed with band pass filter of 1-70Hz$ , sensitivity of 7 uV/mm, display speed of 51m/sec with a 60Hz$  notched filter applied as appropriate. EEG data were recorded continuously and digitally stored.  Video monitoring was available and reviewed as appropriate.  Description: The posterior dominant rhythm consists of 8 Hz activity of moderate voltage (25-35 uV) seen predominantly in posterior head regions, symmetric and reactive to eye opening and eye closing. Sleep was characterized by vertex waves, sleep spindles (12 to 14 Hz), maximal frontocentral region. EEG showed continuous generalized 3 to 6 Hz theta-delta slowing in right temporal region. Intermittent generalized 3-5hz$  theta-delta slowing was also noted. Hyperventilation and photic stimulation were not performed.     ABNORMALITY - Continuous slow, right temporal region - Intermittent slow, generalized  IMPRESSION: This study is suggestive of cortical dysfunction arising from right temporal region likely secondary to underlying structural abnormality. Additionally there is  mild diffuse encephalopathy, nonspecific etiology. No seizures or epileptiform discharges were seen throughout the recording.  Tycen Dockter OBarbra Sarks

## 2022-03-08 NOTE — Consult Note (Signed)
[facility]       Reason for Consult:encephalopathy    Referring Physician: Dr. Si Raider  Principal Problem:   Acute metabolic encephalopathy Active Problems:   Crohn's disease ([place])   Breast cancer metastasized to multiple sites, left ([place])   Thrombocytopenia ([place])    sodium chloride   Intravenous Once   docusate sodium  100 mg Oral BID   sodium chloride flush  3 mL Intravenous Q12H    Recommendations: Stop antibiotics  Assessment: She has an unknown type of encephalopathy, not of infectious origin with negative findings for infection in CSF.     HPI: Carla Clark is a 37 y.o. female with metastatic triple negative breast cancer and recently started on chemotherapy.  She presented with sudden onset of confusion.  No report of fever, no chills.  Some new headache.  Decreased po intake.  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  She is currently unable to give any history.  Her mother and sister are at the bedside.  Confusion was acute.     Review of Systems:  Unable to be assessed due to mental status  Past Medical History:  Diagnosis Date   Anal fissure    Anemia 11/2011   iron deficiency. 01/2013 parenteral iron. Intranasal B12.  Dr Marin Olp follows.    B12 deficiency 11/2012   Breast cancer [facility])    Breast cancer metastasized to multiple sites, left ([place]) 02/02/2022   Breast mass in female 04/21/2012   Cervical cancer screening 01/27/2012   Chicken pox as a child   Crohn's disease ([place]) 2013   ileitis 10/2011. ileitis, mesenteric abscess, ? entero-enteric fistula on 08/2012 CT enterography   Dermatitis 08/22/2016   GERD (gastroesophageal reflux disease)    Hepatitis B immune 07/2012   previous vaccination.    Hives 09/2014   per Dermatologist.  Rx Zyrtec, Benadryl.    Ileitis    Serrated polyp of colon    Small bowel obstruction ([facility])    SVD (spontaneous vaginal delivery)  08/13/2018   Vitamin D deficiency 2014    Social History   Tobacco Use   Smoking status: Never   Smokeless tobacco: Never   Tobacco comments:    never used tobacco  Vaping Use   Vaping Use: Never used  Substance Use Topics   Alcohol use: Not Currently    Alcohol/week: 0.0 standard drinks of alcohol    Comment: occasionaly   Drug use: No    Family History  Problem Relation Age of Onset   Nephrolithiasis Father    Brain cancer Father    Other Father        anaplastic astrocytoma   Ovarian cancer Paternal Aunt        ovarian   Breast cancer Paternal Aunt    Cancer Paternal Aunt        breast (2019) and ovarian cancer (2005)   Other Paternal Uncle 27       cyst removed from pancreas   Pancreatic cancer Paternal Uncle        Stage 1   Cancer Paternal Uncle        pancreatic   Lung cancer Maternal Grandmother    Gout Maternal Grandfather    Heart attack Maternal Grandfather    Ovarian cancer Paternal Grandmother    Cancer Paternal Grandmother 83       ovarian   Pancreatic cancer Paternal Grandfather    Colon  cancer Neg Hx    Stomach cancer Neg Hx    Esophageal cancer Neg Hx     Allergies  Allergen Reactions   Other Other (See Comments)    History of Crohn's disease- NO seeds, tomatoes, anything that doesn't digest well, etc.....    Physical Exam: Constitutional: in no apparent distress  Vitals:   03/08/22 0747 03/08/22 1149  BP: 128/69 119/77  Pulse: (!) 105 90  Resp: 18 17  Temp: 98.8 F (37.1 C) 98.5 F (36.9 C)  SpO2: 97% 98%   EYES: anicteric Respiratory: normal respiratory effort Musculoskeletal: no edema  Lab Results  Component Value Date   WBC 4.1 03/08/2022   HGB 9.2 (L) 03/08/2022   HCT 26.9 (L) 03/08/2022   MCV 82.0 03/08/2022   PLT 42 (L) 03/08/2022    Lab Results  Component Value Date   CREATININE 0.71 03/08/2022   BUN 10 03/08/2022   NA 134 (L) 03/08/2022   K 4.4 03/08/2022   CL 104 03/08/2022   CO2 22 03/08/2022    Lab  Results  Component Value Date   ALT 130 (H) 03/08/2022   AST 94 (H) 03/08/2022   ALKPHOS 73 03/08/2022     Microbiology: Recent Results (from the past 240 hour(s))  Resp panel by RT-PCR (RSV, Flu A&B, Covid) Anterior Nasal Swab     Status: None   Collection Time: 03/07/22 11:53 AM   Specimen: Anterior Nasal Swab  Result Value Ref Range Status   SARS Coronavirus 2 by RT PCR NEGATIVE NEGATIVE Final    Comment: (NOTE) SARS-CoV-2 target nucleic acids are NOT DETECTED.  The SARS-CoV-2 RNA is generally detectable in upper respiratory specimens during the acute phase of infection. The lowest concentration of SARS-CoV-2 viral copies this assay can detect is 138 copies/mL. A negative result does not preclude SARS-Cov-2 infection and should not be used as the sole basis for treatment or other patient management decisions. A negative result may occur with  improper specimen collection/handling, submission of specimen other than nasopharyngeal swab, presence of viral mutation(s) within the areas targeted by this assay, and inadequate number of viral copies(<138 copies/mL). A negative result must be combined with clinical observations, patient history, and epidemiological information. The expected result is Negative.  Fact Sheet for Patients:  EntrepreneurPulse.com.au  Fact Sheet for Healthcare Providers:  IncredibleEmployment.be  This test is no t yet approved or cleared by the [place] FDA and  has been authorized for detection and/or diagnosis of SARS-CoV-2 by FDA under an Emergency Use Authorization (EUA). This EUA will remain  in effect (meaning this test can be used) for the duration of the COVID-19 declaration under Section 564(b)(1) of the Act, 21 U.S.C.section 360bbb-3(b)(1), unless the authorization is terminated  or revoked sooner.       Influenza A by PCR NEGATIVE NEGATIVE Final   Influenza B by PCR NEGATIVE NEGATIVE Final     Comment: (NOTE) The Xpert Xpress SARS-CoV-2/FLU/RSV plus assay is intended as an aid in the diagnosis of influenza from Nasopharyngeal swab specimens and should not be used as a sole basis for treatment. Nasal washings and aspirates are unacceptable for Xpert Xpress SARS-CoV-2/FLU/RSV testing.  Fact Sheet for Patients: EntrepreneurPulse.com.au  Fact Sheet for Healthcare Providers: IncredibleEmployment.be  This test is not yet approved or cleared by the [place] FDA and has been authorized for detection and/or diagnosis of SARS-CoV-2 by FDA under an Emergency Use Authorization (EUA). This EUA will remain in effect (meaning this test can be used)  for the duration of the COVID-19 declaration under Section 564(b)(1) of the Act, 21 U.S.C. section 360bbb-3(b)(1), unless the authorization is terminated or revoked.     Resp Syncytial Virus by PCR NEGATIVE NEGATIVE Final    Comment: (NOTE) Fact Sheet for Patients: EntrepreneurPulse.com.au  Fact Sheet for Healthcare Providers: IncredibleEmployment.be  This test is not yet approved or cleared by the [place] FDA and has been authorized for detection and/or diagnosis of SARS-CoV-2 by FDA under an Emergency Use Authorization (EUA). This EUA will remain in effect (meaning this test can be used) for the duration of the COVID-19 declaration under Section 564(b)(1) of the Act, 21 U.S.C. section 360bbb-3(b)(1), unless the authorization is terminated or revoked.  Performed at [facility], [place] [address], [place], [place] [address]   Group A Strep by PCR     Status: None   Collection Time: 03/07/22  4:30 PM   Specimen: Throat; Sterile Swab  Result Value Ref Range Status   Group A Strep by PCR NOT DETECTED NOT DETECTED Final    Comment: Performed at [facility], [place] [address], [place], [place] [address]  Blood culture  (routine x 2)     Status: None (Preliminary result)   Collection Time: 03/07/22  4:50 PM   Specimen: BLOOD  Result Value Ref Range Status   Specimen Description   Final    BLOOD RIGHT ANTECUBITAL Performed at [facility] [address] [place], [place] [address]    Special Requests   Final    BOTTLES DRAWN AEROBIC AND ANAEROBIC Blood Culture adequate volume Performed at [facility] [address], [place], [place] [address]    Culture   Final    NO GROWTH < 24 HOURS Performed at [facility] [address] [place], [place] [address]    Report Status PENDING  Incomplete  Blood culture (routine x 2)     Status: None (Preliminary result)   Collection Time: 03/07/22  9:59 PM   Specimen: BLOOD  Result Value Ref Range Status   Specimen Description BLOOD RIGHT ANTECUBITAL  Final   Special Requests   Final    BOTTLES DRAWN AEROBIC AND ANAEROBIC Blood Culture results may not be optimal due to an inadequate volume of blood received in culture bottles   Culture   Final    NO GROWTH < 12 HOURS Performed at [facility], [place] [address], [place], [place] [address]    Report Status PENDING  Incomplete  CSF culture w Gram Stain     Status: None (Preliminary result)   Collection Time: 03/07/22 11:03 PM   Specimen: CSF; Cerebrospinal Fluid  Result Value Ref Range Status   Specimen Description CSF  Final   Special Requests NONE  Final   Gram Stain   Final    NO WBC SEEN NO ORGANISMS SEEN CYTOSPIN SMEAR Performed at [facility], [place] [address] [place], [place] [address]    Culture PENDING  Incomplete   Report Status PENDING  Incomplete    Thayer Headings, [facility] for Infectious Disease [facility] [email or web address] 03/08/2022, 1:42 PM

## 2022-03-08 NOTE — Progress Notes (Signed)
Patient admitted to the hospital with AMS. Currently unresponsive in the ICU. Appointments for this week cancelled including nutrition. Notified nutrition of the cancellation.   Will continue to follow for post discharge needs and office follow up.   Oncology Nurse Navigator Documentation     03/08/2022    8:15 AM  Oncology Nurse Navigator Flowsheets  Navigator Follow Up Date: 03/11/2022  Navigator Follow Up Reason: Appointment Review  Navigator Location [facility]  Navigator Encounter Type Appt/Treatment Plan Review  Patient Visit Type MedOnc  Treatment Phase Active Tx  Barriers/Navigation Needs Coordination of Care;Education  Interventions Coordination of Care  Acuity Level 2-Minimal Needs (1-2 Barriers Identified)  Coordination of Care Appts  Support Groups/Services Friends and Family  Time Spent with Patient 15

## 2022-03-09 ENCOUNTER — Encounter: Payer: Commercial Managed Care - PPO | Admitting: Dietician

## 2022-03-09 DIAGNOSIS — R4182 Altered mental status, unspecified: Secondary | ICD-10-CM | POA: Diagnosis not present

## 2022-03-09 DIAGNOSIS — C50912 Malignant neoplasm of unspecified site of left female breast: Secondary | ICD-10-CM | POA: Diagnosis not present

## 2022-03-09 DIAGNOSIS — I7774 Dissection of vertebral artery: Secondary | ICD-10-CM | POA: Diagnosis not present

## 2022-03-09 DIAGNOSIS — G9341 Metabolic encephalopathy: Secondary | ICD-10-CM | POA: Diagnosis not present

## 2022-03-09 LAB — COMPREHENSIVE METABOLIC PANEL
ALT: 123 U/L — ABNORMAL HIGH (ref 0–44)
AST: 85 U/L — ABNORMAL HIGH (ref 15–41)
Albumin: 2.7 g/dL — ABNORMAL LOW (ref 3.5–5.0)
Alkaline Phosphatase: 80 U/L (ref 38–126)
Anion gap: 7 (ref 5–15)
BUN: 9 mg/dL (ref 6–20)
CO2: 22 mmol/L (ref 22–32)
Calcium: 9 mg/dL (ref 8.9–10.3)
Chloride: 107 mmol/L (ref 98–111)
Creatinine, Ser: 0.75 mg/dL (ref 0.44–1.00)
GFR, Estimated: >60 mL/min (ref >60)
Glucose, Bld: 103 mg/dL — ABNORMAL HIGH (ref 70–99)
Potassium: 4.2 mmol/L (ref 3.5–5.1)
Sodium: 136 mmol/L (ref 135–145)
Total Bilirubin: 1 mg/dL (ref 0.3–1.2)
Total Protein: 5.9 g/dL — ABNORMAL LOW (ref 6.5–8.1)

## 2022-03-09 LAB — TYPE AND SCREEN
ABO/RH(D): B POS
Antibody Screen: NEGATIVE
Unit division: 0
Unit division: 0

## 2022-03-09 LAB — BPAM RBC
Blood Product Expiration Date: 202402172359
Blood Product Expiration Date: 202402172359
ISSUE DATE / TIME: 202402130012
ISSUE DATE / TIME: 202402130323
Unit Type and Rh: 7300
Unit Type and Rh: 7300

## 2022-03-09 LAB — CBC WITH DIFFERENTIAL/PLATELET
Abs Immature Granulocytes: 0 10*3/uL (ref 0.00–0.07)
Basophils Absolute: 0 10*3/uL (ref 0.0–0.1)
Basophils Relative: 0 %
Eosinophils Absolute: 0.1 10*3/uL (ref 0.0–0.5)
Eosinophils Relative: 2 %
HCT: 25.2 % — ABNORMAL LOW (ref 36.0–46.0)
Hemoglobin: 8.7 g/dL — ABNORMAL LOW (ref 12.0–15.0)
Lymphocytes Relative: 82 %
Lymphs Abs: 4.2 10*3/uL — ABNORMAL HIGH (ref 0.7–4.0)
MCH: 28.2 pg (ref 26.0–34.0)
MCHC: 34.5 g/dL (ref 30.0–36.0)
MCV: 81.8 fL (ref 80.0–100.0)
Monocytes Absolute: 0.1 10*3/uL (ref 0.1–1.0)
Monocytes Relative: 1 %
Neutro Abs: 0.8 10*3/uL — ABNORMAL LOW (ref 1.7–7.7)
Neutrophils Relative %: 15 %
Platelets: 83 10*3/uL — ABNORMAL LOW (ref 150–400)
RBC: 3.08 MIL/uL — ABNORMAL LOW (ref 3.87–5.11)
RDW: 15.9 % — ABNORMAL HIGH (ref 11.5–15.5)
WBC: 5.1 10*3/uL (ref 4.0–10.5)
nRBC: 2.5 % — ABNORMAL HIGH (ref 0.0–0.2)

## 2022-03-09 LAB — HEPATITIS PANEL, ACUTE
HCV Ab: NONREACTIVE
Hep A IgM: NONREACTIVE
Hep B C IgM: NONREACTIVE
Hepatitis B Surface Ag: NONREACTIVE

## 2022-03-09 LAB — PREPARE PLATELET PHERESIS
Unit division: 0
Unit division: 0

## 2022-03-09 LAB — BPAM PLATELET PHERESIS
Blood Product Expiration Date: 202402152359
Blood Product Expiration Date: 202402152359
ISSUE DATE / TIME: 202402131417
ISSUE DATE / TIME: 202402131731
Unit Type and Rh: 5100
Unit Type and Rh: 6200

## 2022-03-09 LAB — LACTATE DEHYDROGENASE: LDH: 450 U/L — ABNORMAL HIGH (ref 98–192)

## 2022-03-09 LAB — MAGNESIUM: Magnesium: 2 mg/dL (ref 1.7–2.4)

## 2022-03-09 LAB — CK: Total CK: 137 U/L (ref 38–234)

## 2022-03-09 NOTE — Progress Notes (Signed)
Subjective: She is markedly improved, essentially back to baseline  Exam: Vitals:   03/09/22 1140 03/09/22 1510  BP: 99/62 110/71  Pulse: 82 91  Resp: 19 17  Temp: 98.7 F (37.1 C) (!) 97.4 F (36.3 C)  SpO2: 98% 94%   Gen: In bed, NAD Resp: non-labored breathing, no acute distress Abd: soft, nt  Neuro: MS: Awake, alert, oriented.  She is able to spell world backwards without difficulty. CN: Visual fields full, EOMI, face symmetric Motor: 5/5 throughout Sensory: Intact to light touch  Pertinent Labs: Results for orders placed or performed during the hospital encounter of 03/07/22 (from the past 24 hour(s))  CBC with Differential/Platelet     Status: Abnormal   Collection Time: 03/09/22  2:49 AM  Result Value Ref Range   WBC 5.1 4.0 - 10.5 K/uL   RBC 3.08 (L) 3.87 - 5.11 MIL/uL   Hemoglobin 8.7 (L) 12.0 - 15.0 g/dL   HCT 25.2 (L) 36.0 - 46.0 %   MCV 81.8 80.0 - 100.0 fL   MCH 28.2 26.0 - 34.0 pg   MCHC 34.5 30.0 - 36.0 g/dL   RDW 15.9 (H) 11.5 - 15.5 %   Platelets 83 (L) 150 - 400 K/uL   nRBC 2.5 (H) 0.0 - 0.2 %   Neutrophils Relative % 15 %   Neutro Abs 0.8 (L) 1.7 - 7.7 K/uL   Lymphocytes Relative 82 %   Lymphs Abs 4.2 (H) 0.7 - 4.0 K/uL   Monocytes Relative 1 %   Monocytes Absolute 0.1 0.1 - 1.0 K/uL   Eosinophils Relative 2 %   Eosinophils Absolute 0.1 0.0 - 0.5 K/uL   Basophils Relative 0 %   Basophils Absolute 0.0 0.0 - 0.1 K/uL   WBC Morphology ABSOLUTE LYMPHOCYTOSIS    Abs Immature Granulocytes 0.00 0.00 - 0.07 K/uL   Bite Cells PRESENT   Comprehensive metabolic panel     Status: Abnormal   Collection Time: 03/09/22  3:28 AM  Result Value Ref Range   Sodium 136 135 - 145 mmol/L   Potassium 4.2 3.5 - 5.1 mmol/L   Chloride 107 98 - 111 mmol/L   CO2 22 22 - 32 mmol/L   Glucose, Bld 103 (H) 70 - 99 mg/dL   BUN 9 6 - 20 mg/dL   Creatinine, Ser 0.75 0.44 - 1.00 mg/dL   Calcium 9.0 8.9 - 10.3 mg/dL   Total Protein 5.9 (L) 6.5 - 8.1 g/dL   Albumin 2.7  (L) 3.5 - 5.0 g/dL   AST 85 (H) 15 - 41 U/L   ALT 123 (H) 0 - 44 U/L   Alkaline Phosphatase 80 38 - 126 U/L   Total Bilirubin 1.0 0.3 - 1.2 mg/dL   GFR, Estimated >60 >60 mL/min   Anion gap 7 5 - 15  Magnesium     Status: None   Collection Time: 03/09/22  3:28 AM  Result Value Ref Range   Magnesium 2.0 1.7 - 2.4 mg/dL  Lactate dehydrogenase     Status: Abnormal   Collection Time: 03/09/22  3:29 AM  Result Value Ref Range   LDH 450 (H) 98 - 192 U/L  Hepatitis panel, acute     Status: None   Collection Time: 03/09/22 10:52 AM  Result Value Ref Range   Hepatitis B Surface Ag NON REACTIVE NON REACTIVE   HCV Ab NON REACTIVE NON REACTIVE   Hep A IgM NON REACTIVE NON REACTIVE   Hep B C IgM NON REACTIVE NON  REACTIVE  CK     Status: None   Collection Time: 03/09/22 10:52 AM  Result Value Ref Range   Total CK 137 38 - 234 U/L     Impression: 37 year old female with acute onset of confusion in the setting of significant blood loss.  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  With no pleocytosis or other evidence of infection on LP, I think that an infectious etiology is extremely low.  With no signs of enhancement, I think that metastatic disease is less likely.   One possibility, no matter what the etiology of her MRI finding, would be seizure as etiology of her acute presentation, but no findings suggestive of seizure or seizure predisposition on EEG despite a prolonged symptomatic period recorded on EEG.  Immune related adverse events associated with checkpoint inhibitors have described encephalopathy is a delayed side effect of Keytruda, but this is improving without any treatment with steroids.  I am not sure I can definitively say this is related, but without any other clear explanation I think it would need to be considered.  If she were to receive Keytruda again and develop encephalopathy, would favor treatment with steroids.  With her return to baseline, I do not  think I have any other acute recommendations at this time that are all likely to be helpful.  If he were to have episodic symptoms in the future, I would consider repeating EEG and potentially starting empiric antiepileptic therapy.  With her severe thrombocytopenia, I would not start antiplatelet therapy for her vertebral dissection, but if her platelets return to greater than 50 consistently, then I would start a baby aspirin for stroke prevention.   Recommendations: 1) can discontinue continuous EEG 2) once her platelets are consistently above 50, would start baby aspirin for stroke prevention from asymptomatic vertebral dissection 3) neurology will be available on an as-needed basis, please call with further questions or concerns.  Roland Rack, MD [facility] [phone]  If 7pm- 7am, please page neurology on call as listed in [place].

## 2022-03-09 NOTE — Progress Notes (Signed)
I am incredibly amazed as to how well Carla Clark looks this morning.  She really has made a nice turnaround.  She knew who I was.  We talked.  I am just incredibly pleased and not surprised as to her resilience.  I think that her spinal fluid all was negative.  This cytology came back negative.  Her labs look pretty good.  I think her liver test continue to improve.  Her white cell count is 5.1.  Hemoglobin 8.7.  Platelet count 83,000.  Because of this small subdural hematoma, we have tried to keep her platelet count above 75,000.  I think that we can be a little bit more conservative at this point.  She has had no fever.  I think she is off antibiotics.  She did eat.  She had no problems swallowing.  Hopefully, she will be able to come off the EEG machine.  I do appreciate Neurology's help.  I think the fact that she has nucleated red blood cells in the peripheral blood is indicative of the fact that there is tumor in her bone marrow.  All of her vital signs look good.  She is afebrile.  Blood pressure 106/56.  Her lungs sound clear bilaterally.  Cardiac exam regular rate and rhythm.  She has no murmurs.  Abdomen is soft.  She has no fluid wave.  There is no guarding or rebound tenderness.  She has no palpable liver or spleen tip.  Extremity shows no clubbing, cyanosis or edema.  She moves her extremities well.  Neurological exam shows no obvious neurological deficits.  Again, still puzzled as to what exactly was the cause of this neurological deficiency.  She seems to be recovering.  I know that she got quite anemic.  I still think we just had to follow her blood counts.  Hopefully, she will to do a little bit more activity.  I do not know if we need to get Physical Therapy involved.  Obviously, she is getting incredible care from all the staff up on [facility].   [name], MD  1 Cor 13:4

## 2022-03-09 NOTE — Progress Notes (Signed)
EEG D/C'd. No skin breakdown noted. [facility] was notified.

## 2022-03-09 NOTE — Progress Notes (Signed)
LTM maint complete - no skin breakdown [facility] monitored, Event button test confirmed by [facility]. ? ?

## 2022-03-09 NOTE — Progress Notes (Addendum)
[facility] PROGRESS NOTE    Progress Note  Carla Clark  [id number] DOB: 1985/12/26 DOA: 03/07/2022 PCP: Mosie Lukes, MD     Brief Narrative:   Carla Clark is an 37 y.o. 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  Woke up went to the bathroom disoriented called her oncologist and related to bring her to [facility], son had a strep throat 4 days prior to admission.  Significant studies: 03/07/2022 CT of the head showed no acute abnormalities. 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 03/07/2022 MRI of the brain showed no acute CVA or metastatic disease there is a right-sided convexity measuring 2.5 cm in thickness. 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   03/07/2022 MRI of the brain study degraded by motion vertebral artery showed convincing dissection with maximal stenosis about 40% extending from C7 to to C4  Antibiotics: None  Microbiology data: Blood culture: Negative till date  Procedures: None  Assessment/Plan:   Acute metabolic encephalopathy/questionable encephalitis Presented febrile with altered mental status CSF fluid negative for bacterial infection. ID was consulted who thinks is not infectious in etiology with CSF fluid findings. CSF fluid was unremarkable antibiotics were discontinued. Neurology recommended to follow-up cytology. EEG suggest cortical dysfunction arising from the right temporal region likely and all diffuse encephalopathy of nonspecific etiology no seizures identified.  Subdural  hematoma/vertebral artery dissection: Seen on MRI.  MRI shows probable chronic or subacute hematoma Further management per neurology. Further management per neurology.  Elevated LFTs: Check an acute hepatitis panel, denies any abdominal pain nausea or vomiting Murphy sign is negative.  She relates she has an appetite.  Does not remember when was her last bowel movement. Check CK  Thrombocytopenia: In the setting of chemotherapy, oncology recommended try to keep platelet count above 70,000 if possible due to subdural hematoma. Post 2 units of platelets, counts this morning is 83.  Anemia induced chemotherapy: On arrival hemoglobin was 5.9 she was transfused 2 units packed red blood cells. Oncology was consulted they think the blood drop in hemoglobin was likely due to chemotherapy. Hemoglobin this morning is 8.7 we will continue to monitor.  Stage IV metastatic breast cancer: With the first chemo treatment about 1 week prior to admission. Oncology on board recommended follow cytology.  Crohn's disease: Continue current medication follow-up with GI as an outpatient.  DVT prophylaxis: SCD Family Communication:husband Status is: Inpatient Remains inpatient appropriate because: Acute hematoma/subdural    Code Status:     Code Status Orders  (From admission, onward)           Start     Ordered   03/07/22 2035  Full code  Continuous       Question:  By:  Answer:  Consent: discussion documented in EHR   03/07/22 2035           Code Status History     Date Active Date Inactive Code Status Order ID Comments User Context   08/15/2020 2139 08/17/2020 1931 Full Code [id number]  Cheri Fowler, MD Inpatient  08/15/2020 1021 08/15/2020 2015 Full Code [id number]  Cheri Fowler, MD Inpatient   08/13/2018 0158 08/14/2018 1621 Full Code [id number]  Sherlyn Hay, DO Inpatient   08/12/2018 1711 08/13/2018 0148 Full Code [id number]  Danise Edge, RN Inpatient    04/17/2015 0559 04/20/2015 1521 Full Code [id number]  Rise Patience, MD Inpatient         IV Access:   Peripheral IV   Procedures and diagnostic studies:   Overnight EEG with video  Result Date: 03/08/2022 Lora Havens, MD     03/08/2022  9:09 AM Patient Name: Carla Clark MRN: [id number] Epilepsy Attending: Lora Havens Referring Physician/Provider: Donnetta Simpers, MD Duration: 03/08/2022 0105 to 0900 Patient history:  37 y.o. female with aggressive stage 4 breast cancer with no known brain mets recently started chemo with Carla Clark and paclitaxel who presented with confusion and aphasia. EEG to evaluate for seizure. Level of alertness: Awake, asleep AEDs during EEG study: None Technical aspects: This EEG study was done with scalp electrodes positioned according to the 10-20 International system of electrode placement. Electrical activity was reviewed with band pass filter of 1-70Hz$ , sensitivity of 7 uV/mm, display speed of 52m/sec with a 60Hz$  notched filter applied as appropriate. EEG data were recorded continuously and digitally stored.  Video monitoring was available and reviewed as appropriate. Description: The posterior dominant rhythm consists of 8 Hz activity of moderate voltage (25-35 uV) seen predominantly in posterior head regions, symmetric and reactive to eye opening and eye closing. Sleep was characterized by vertex waves, sleep spindles (12 to 14 Hz), maximal frontocentral region. EEG showed continuous generalized 3 to 6 Hz theta-delta slowing in right temporal region. Intermittent generalized 3-5hz$  theta-delta slowing was also noted. Hyperventilation and photic stimulation were not performed.   ABNORMALITY - Continuous slow, right temporal region - Intermittent slow, generalized IMPRESSION: This study is suggestive of cortical dysfunction arising from right temporal region likely secondary to underlying structural abnormality. Additionally there is mild diffuse  encephalopathy, nonspecific etiology. No seizures or epileptiform discharges were seen throughout the recording. PLora Havens  MR BRAIN W WO CONTRAST  Result Date: 03/07/2022 CLINICAL DATA:  Transient ischemic attack. Stroke. Altered mental status. Abnormal left vertebral artery. EXAM: MRI HEAD WITHOUT AND WITH CONTRAST TECHNIQUE: Multiplanar, multiecho pulse sequences of the brain and surrounding structures were obtained without and with intravenous contrast. CONTRAST:  5.553mGADAVIST GADOBUTROL 1 MMOL/ML IV SOLN COMPARISON:  CT studies earlier same day. FINDINGS: Brain: Diffusion imaging does not show any acute or subacute infarction. No focal abnormality is seen affecting the brainstem or cerebellum. The cerebral hemispheres appear normal without evidence of cortical or large vessel stroke or metastatic lesion. Patient has a right-sided holo convexity subdural collection measuring 2.5 mm in thickness, not visible on the CT scan. This is most consistent with a late subacute or chronic subdural collection. The postcontrast imaging is degraded by motion but I do not see any sign of dural enhancement. Whereas it is not possible to exclude dural tumor in this case, I do not have any positive evidence for that. If we were dealing with dural metastatic disease and might expect some finding on the left. No hydrocephalus.  No midline shift Vascular: Major vessels at the base of the brain show flow. Skull and upper cervical spine: No osseous disease is discernible in the region of the calvarium, skull base or upper cervical spine. Sinuses/Orbits: Clear/normal Other: None IMPRESSION: 1. No evidence of acute stroke or parenchymal  metastatic disease. 2. Right-sided holo convexity subdural collection measuring 2.5 mm in thickness, not visible on the CT scan. 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 I do not see any sign of dural enhancement, though the postcontrast imaging is markedly  degraded by motion. Whereas it is not possible to exclude dural tumor in this case given the imaging we have, I do not have any positive evidence for that. If we were dealing with dural metastatic disease I might also expect some finding on the left. One could consider repeating the postcontrast imaging when the patient is in a condition that would allow motion free scanning. 3. Case discussed with Dr. Quinn Axe at about 1545 hours. Electronically Signed   By: Nelson Chimes M.D.   On: 03/07/2022 15:55   MR ANGIO NECK W WO CONTRAST  Result Date: 03/07/2022 CLINICAL DATA:  Neuro deficit, acute, stroke suspected. Possible vertebral occlusion on CT angiography of the neck. History of metastatic breast cancer. EXAM: MRA NECK WITHOUT AND WITH CONTRAST TECHNIQUE: Multiplanar and multiecho pulse sequences of the neck were obtained without and with intravenous contrast. Angiographic images of the neck were obtained using MRA technique without and with intravenous contrast. CONTRAST:  5.72m GADAVIST GADOBUTROL 1 MMOL/ML IV SOLN COMPARISON:  CT angiography same day FINDINGS: Contrast enhanced portion of the examination is markedly degraded by motion. Precontrast time-of-flight imaging is better quality. Unfortunately, the technologist did not perform the requested T1 axial fat-sat imaging. 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 Maximal stenosis of the true lumen in that region is about 40%, to estimation. Otherwise, the findings in the region are normal. The carotid bifurcation regions are normal. The right vertebral artery is normal. IMPRESSION: 1. Motion degraded contrast enhanced portion of the study. 2. Precontrast T1 fat saturated axial imaging was not done as requested. However, the left vertebral artery does show a fairly convincing dissection with maximal stenosis of about 40%., based on the axial time-of-flight  imaging. This extends from the level of about C7 to the level of about C4. 3. Normal right vertebral artery. 4. Normal carotid bifurcation regions. Electronically Signed   By: MNelson ChimesM.D.   On: 03/07/2022 15:30   CT ANGIO HEAD NECK W WO CM W PERF (CODE STROKE)  Addendum Date: 03/07/2022   ADDENDUM REPORT: 03/07/2022 15:26 ADDENDUM: CT perfusion was also performed. This demonstrates reported small (12 mL) of penumbra which may be artifactual given located in multiple vascular territories and also in the inferior temporal lobes which is often artifactual. An MRI could better assess for acute infarct. No evidence of core infarct by CT perfusion. Electronically Signed   By: FMargaretha SheffieldM.D.   On: 03/07/2022 15:26   Result Date: 03/07/2022 CLINICAL DATA:  Neuro deficit, acute, stroke suspected EXAM: CT ANGIOGRAPHY HEAD AND NECK TECHNIQUE: Multidetector CT imaging of the head and neck was performed using the standard protocol during bolus administration of intravenous contrast. Multiplanar CT image reconstructions and MIPs were obtained to evaluate the vascular anatomy. Carotid stenosis measurements (when applicable) are obtained utilizing NASCET criteria, using the distal internal carotid diameter as the denominator. RADIATION DOSE REDUCTION: This exam was performed according to the departmental dose-optimization program which includes automated exposure control, adjustment of the mA and/or kV according to patient size and/or use of iterative reconstruction technique. CONTRAST:  1041mOMNIPAQUE IOHEXOL 350 MG/ML SOLN COMPARISON:  None Available.  FINDINGS: CTA NECK FINDINGS Aortic arch: Great vessel origins are patent without significant stenosis. Right carotid system: No evidence of dissection, stenosis (50% or greater), or occlusion. Left carotid system: No evidence of dissection, stenosis (50% or greater), or occlusion. Vertebral arteries: Luminal irregularity and eccentric soft tissue attenuation in  the left V2 vertebral artery from C4-C5 to C6-C7, which is concerning for dissection and possibly intramural hematoma (for example see series 9, image 118; series 7, images 242 through 192). Right vertebral artery is patent without significant stenosis or evidence of dissection. Skeleton: Extensive osseous metastatic disease. Other neck: No acute findings on limited assessment. Upper chest: Visualized lung apices are clear. Please see prior PET-CT for evaluation of adenopathy. Review of the MIP images confirms the above findings CTA HEAD FINDINGS Anterior circulation: Bilateral intracranial ICAs, MCAs, and ACAs are patent without proximal hemodynamically significant stenosis Posterior circulation: Bilateral intradural vertebral arteries, basilar artery and bilateral posterior cerebral arteries are patent without proximal hemodynamically significant stenosis. Venous sinuses: As permitted by contrast timing, patent. Review of the MIP images confirms the above findings IMPRESSION: 1. 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 Resulting moderate to severe stenosis of the vertebral artery. An MRA of the neck with axial T1 fat saturation may be helpful to further evaluate if clinically warranted. 2. Extensive osseous metastatic disease. Findings discussed with Dr. Quinn Axe via telephone at 12:55 p.m. Electronically Signed: By: Margaretha Sheffield M.D. On: 03/07/2022 13:03   DG Chest Portable 1 View  Result Date: 03/07/2022 CLINICAL DATA:  Breast cancer EXAM: PORTABLE CHEST 1 VIEW COMPARISON:  PET-CT 01/21/2022 FINDINGS: Right-sided Port-A-Cath with the tip projecting over the SVC. No focal consolidation. No pleural effusion or pneumothorax. Heart and mediastinal contours are unremarkable. No acute osseous abnormality. IMPRESSION: No active disease. Electronically Signed   By: Kathreen Devoid M.D.   On: 03/07/2022 13:44   CT HEAD CODE STROKE WO CONTRAST  Result Date:  03/07/2022 CLINICAL DATA:  Code stroke.  Neuro deficit, acute, stroke suspected EXAM: CT HEAD WITHOUT CONTRAST TECHNIQUE: Contiguous axial images were obtained from the base of the skull through the vertex without intravenous contrast. RADIATION DOSE REDUCTION: This exam was performed according to the departmental dose-optimization program which includes automated exposure control, adjustment of the mA and/or kV according to patient size and/or use of iterative reconstruction technique. COMPARISON:  None Available. FINDINGS: Brain: No evidence of acute large vascular territory infarction, hemorrhage, hydrocephalus, extra-axial collection or mass lesion/mass effect. Vascular: No hyperdense vessel identified. Skull: No acute fracture. Sinuses/Orbits: Clear sinuses.  No acute orbital findings. Other: No mastoid effusions. ASPECTS [facility] Stroke Program Early CT Score) total score (0-10 with 10 being normal): 10. IMPRESSION: 1. No evidence of acute intracranial abnormality. 2. ASPECTS is 10. Code stroke imaging results were communicated on 03/07/2022 at 12:32 pm to provider Pearline Cables via telephone, who verbally acknowledged these results. Electronically Signed   By: Margaretha Sheffield M.D.   On: 03/07/2022 12:33     Medical Consultants:   None.   Subjective:    Carla Clark feels better this morning is hungry.  Objective:    Vitals:   03/08/22 1800 03/08/22 1929 03/08/22 2317 03/09/22 0307  BP: 120/81 117/80 108/77 122/76  Pulse: 96 98 95 90  Resp: 20 20 20 20  $ Temp: 99.1 F (37.3 C) 99.2 F (37.3 C) 98.6 F (37 C) 99.7 F (37.6 C)  TempSrc: Oral Oral Oral Oral  SpO2: 97% 97% 93% 94%  Weight:       SpO2: 94 % O2 Flow Rate (L/min): 2 L/min FiO2 (%): 100 %   Intake/Output Summary (Last 24 hours) at 03/09/2022 0752 Last data filed at 03/08/2022 1900 Gross per 24 hour  Intake 1247.85 ml  Output 800 ml  Net 447.85 ml   Filed Weights   03/07/22 1146  Weight: 54.3 kg    Exam: General  exam: In no acute distress. Respiratory system: Good air movement and clear to auscultation. Cardiovascular system: S1 & S2 heard, RRR. No JVD. Gastrointestinal system: Abdomen is nondistended, soft and nontender.  Central nervous system: Alert and oriented. No focal neurological deficits. Extremities: No pedal edema. Skin: No rashes, lesions or ulcers Psychiatry: Judgement and insight appear normal. Mood & affect appropriate.    Data Reviewed:    Labs: Basic Metabolic Panel: Recent Labs  Lab 03/03/22 0810 03/07/22 1133 03/08/22 0500 03/09/22 0328  NA 135 137 134* 136  K 4.0 4.5 4.4 4.2  CL 102 107 104 107  CO2 23 23 22 22  $ GLUCOSE 121* 96 88 103*  BUN 21* 15 10 9  $ CREATININE 0.82 0.66 0.71 0.75  CALCIUM 10.2 8.9 8.9 9.0  MG  --   --  2.1 2.0   GFR Estimated Creatinine Clearance: 82 mL/min (by C-G formula based on SCr of 0.75 mg/dL). Liver Function Tests: Recent Labs  Lab 03/03/22 0810 03/07/22 1133 03/08/22 0500 03/09/22 0328  AST 200* 112* 94* 85*  ALT 194* 149* 130* 123*  ALKPHOS 109 88 73 80  BILITOT 1.1 1.2 2.0* 1.0  PROT 7.7 6.9 5.9* 5.9*  ALBUMIN 4.0 3.6 2.8* 2.7*   Recent Labs  Lab 03/07/22 1133  LIPASE 69*   Recent Labs  Lab 03/07/22 1134  AMMONIA 11   Coagulation profile Recent Labs  Lab 03/07/22 1133 03/08/22 1149  INR 1.3* 1.2   COVID-19 Labs  Recent Labs    03/08/22 0800 03/09/22 0329  LDH 437* 450*    Lab Results  Component Value Date   SARSCOV2NAA NEGATIVE 03/07/2022   SARSCOV2NAA NEGATIVE 08/12/2020   [place]aty NEGATIVE 08/12/2018    CBC: Recent Labs  Lab 03/03/22 0810 03/07/22 1133 03/07/22 2000 03/07/22 2156 03/08/22 0500 03/09/22 0249  WBC 19.9* 6.1 4.2  --  4.1 5.1  NEUTROABS 3.3 0.8*  --   --  0.6* 0.8*  HGB 10.0* 8.2* 5.9* 6.6* 9.2* 8.7*  HCT 31.1* 25.4* 18.5* 19.5* 26.9* 25.2*  MCV 85.4 84.9 87.3  --  82.0 81.8  PLT 28* 104* 62*  --  42* 83*   Cardiac Enzymes: No results for input(s): "CKTOTAL",  "CKMB", "CKMBINDEX", "TROPONINI" in the last 168 hours. BNP (last 3 results) No results for input(s): "PROBNP" in the last 8760 hours. CBG: Recent Labs  Lab 03/07/22 1151  GLUCAP 93   D-Dimer: No results for input(s): "DDIMER" in the last 72 hours. Hgb A1c: No results for input(s): "HGBA1C" in the last 72 hours. Lipid Profile: No results for input(s): "CHOL", "HDL", "LDLCALC", "TRIG", "CHOLHDL", "LDLDIRECT" in the last 72 hours. Thyroid function studies: No results for input(s): "TSH", "T4TOTAL", "T3FREE", "THYROIDAB" in the last 72 hours.  Invalid input(s): "FREET3" Anemia work up: No results for input(s): "VITAMINB12", "FOLATE", "FERRITIN", "TIBC", "IRON", "RETICCTPCT" in the last 72 hours. Sepsis Labs: Recent Labs  Lab 03/07/22 1133 03/07/22 1135 03/07/22 1630 03/07/22 2000 03/08/22 0500 03/09/22 0249  WBC 6.1  --   --  4.2 4.1 5.1  LATICACIDVEN  --  1.2 1.2  --   --   --  Microbiology Recent Results (from the past 240 hour(s))  Resp panel by RT-PCR (RSV, Flu A&B, Covid) Anterior Nasal Swab     Status: None   Collection Time: 03/07/22 11:53 AM   Specimen: Anterior Nasal Swab  Result Value Ref Range Status   SARS Coronavirus 2 by RT PCR NEGATIVE NEGATIVE Final    Comment: (NOTE) SARS-CoV-2 target nucleic acids are NOT DETECTED.  The SARS-CoV-2 RNA is generally detectable in upper respiratory specimens during the acute phase of infection. The lowest concentration of SARS-CoV-2 viral copies this assay can detect is 138 copies/mL. A negative result does not preclude SARS-Cov-2 infection and should not be used as the sole basis for treatment or other patient management decisions. A negative result may occur with  improper specimen collection/handling, submission of specimen other than nasopharyngeal swab, presence of viral mutation(s) within the areas targeted by this assay, and inadequate number of viral copies(<138 copies/mL). A negative result must be combined  with clinical observations, patient history, and epidemiological information. The expected result is Negative.  Fact Sheet for Patients:  EntrepreneurPulse.com.au  Fact Sheet for Healthcare Providers:  IncredibleEmployment.be  This test is no t yet approved or cleared by the [place] FDA and  has been authorized for detection and/or diagnosis of SARS-CoV-2 by FDA under an Emergency Use Authorization (EUA). This EUA will remain  in effect (meaning this test can be used) for the duration of the COVID-19 declaration under Section 564(b)(1) of the Act, 21 U.S.C.section 360bbb-3(b)(1), unless the authorization is terminated  or revoked sooner.       Influenza A by PCR NEGATIVE NEGATIVE Final   Influenza B by PCR NEGATIVE NEGATIVE Final    Comment: (NOTE) The Xpert Xpress SARS-CoV-2/FLU/RSV plus assay is intended as an aid in the diagnosis of influenza from Nasopharyngeal swab specimens and should not be used as a sole basis for treatment. Nasal washings and aspirates are unacceptable for Xpert Xpress SARS-CoV-2/FLU/RSV testing.  Fact Sheet for Patients: EntrepreneurPulse.com.au  Fact Sheet for Healthcare Providers: IncredibleEmployment.be  This test is not yet approved or cleared by the [place] FDA and has been authorized for detection and/or diagnosis of SARS-CoV-2 by FDA under an Emergency Use Authorization (EUA). This EUA will remain in effect (meaning this test can be used) for the duration of the COVID-19 declaration under Section 564(b)(1) of the Act, 21 U.S.C. section 360bbb-3(b)(1), unless the authorization is terminated or revoked.     Resp Syncytial Virus by PCR NEGATIVE NEGATIVE Final    Comment: (NOTE) Fact Sheet for Patients: EntrepreneurPulse.com.au  Fact Sheet for Healthcare Providers: IncredibleEmployment.be  This test is not yet approved  or cleared by the [place] FDA and has been authorized for detection and/or diagnosis of SARS-CoV-2 by FDA under an Emergency Use Authorization (EUA). This EUA will remain in effect (meaning this test can be used) for the duration of the COVID-19 declaration under Section 564(b)(1) of the Act, 21 U.S.C. section 360bbb-3(b)(1), unless the authorization is terminated or revoked.  Performed at [facility], [place] [address], [place], [place] [address]   Group A Strep by PCR     Status: None   Collection Time: 03/07/22  4:30 PM   Specimen: Throat; Sterile Swab  Result Value Ref Range Status   Group A Strep by PCR NOT DETECTED NOT DETECTED Final    Comment: Performed at [facility], [place] [address], [place], [place] [address]  Blood culture (routine x 2)     Status:  None (Preliminary result)   Collection Time: 03/07/22  4:50 PM   Specimen: BLOOD  Result Value Ref Range Status   Specimen Description   Final    BLOOD RIGHT ANTECUBITAL Performed at [place] [address] [place], [place] [address]    Special Requests   Final    BOTTLES DRAWN AEROBIC AND ANAEROBIC Blood Culture adequate volume Performed at [facility] [address] [place], [place] [address]    Culture   Final    NO GROWTH < 24 HOURS Performed at [place] [address], [place], [place] [address]    Report Status PENDING  Incomplete  Blood culture (routine x 2)     Status: None (Preliminary result)   Collection Time: 03/07/22  9:59 PM   Specimen: BLOOD  Result Value Ref Range Status   Specimen Description BLOOD RIGHT ANTECUBITAL  Final   Special Requests   Final    BOTTLES DRAWN AEROBIC AND ANAEROBIC Blood Culture results may not be optimal due to an inadequate volume of blood received in culture bottles   Culture   Final    NO GROWTH < 12 HOURS Performed at [facility], [place] [address] [place], [place]  [address]    Report Status PENDING  Incomplete  CSF culture w Gram Stain     Status: None (Preliminary result)   Collection Time: 03/07/22 11:03 PM   Specimen: CSF; Cerebrospinal Fluid  Result Value Ref Range Status   Specimen Description CSF  Final   Special Requests NONE  Final   Gram Stain   Final    NO WBC SEEN NO ORGANISMS SEEN CYTOSPIN SMEAR Performed at [facility], [place] [address], [place], [place] [address]    Culture PENDING  Incomplete   Report Status PENDING  Incomplete     Medications:    Chlorhexidine Gluconate Cloth  6 each Topical Q2000   docusate sodium  100 mg Oral BID   sodium chloride flush  3 mL Intravenous Q12H   Continuous Infusions:  sodium chloride 100 mL/hr at 03/09/22 0621      LOS: 2 days   Charlynne Cousins  [facility]  03/09/2022, 7:52 AM

## 2022-03-09 NOTE — Procedures (Signed)
Patient Name: Carla Clark  MRN: [id number]  Epilepsy Attending: Lora Havens  Referring Physician/Provider: Donnetta Simpers, MD  Duration: 03/09/2022 0105 to 03/09/2022 1446   Patient history:  37 y.o. female with aggressive stage 4 breast cancer with no known brain mets recently started chemo with [place] and paclitaxel who presented with confusion and aphasia. EEG to evaluate for seizure.   Level of alertness: Awake, asleep   AEDs during EEG study: None   Technical aspects: This EEG study was done with scalp electrodes positioned according to the 10-20 International system of electrode placement. Electrical activity was reviewed with band pass filter of 1-70Hz$ , sensitivity of 7 uV/mm, display speed of 7m/sec with a 60Hz$  notched filter applied as appropriate. EEG data were recorded continuously and digitally stored.  Video monitoring was available and reviewed as appropriate.   Description: The posterior dominant rhythm consists of 8 Hz activity of moderate voltage (25-35 uV) seen predominantly in posterior head regions, symmetric and reactive to eye opening and eye closing. Sleep was characterized by vertex waves, sleep spindles (12 to 14 Hz), maximal frontocentral region. Intermittent generalized 3-5hz$  theta-delta slowing was also noted. Hyperventilation and photic stimulation were not performed.      ABNORMALITY - Intermittent slow, generalized   IMPRESSION: This study is suggestive of mild diffuse encephalopathy, nonspecific etiology. No seizures or epileptiform discharges were seen throughout the recording.  EEG appears to be improving compared to previous day.   Carla Clark

## 2022-03-10 ENCOUNTER — Other Ambulatory Visit: Payer: Commercial Managed Care - PPO

## 2022-03-10 ENCOUNTER — Inpatient Hospital Stay: Payer: Commercial Managed Care - PPO

## 2022-03-10 ENCOUNTER — Ambulatory Visit: Payer: Commercial Managed Care - PPO

## 2022-03-10 DIAGNOSIS — G9341 Metabolic encephalopathy: Secondary | ICD-10-CM | POA: Diagnosis not present

## 2022-03-10 LAB — CBC WITH DIFFERENTIAL/PLATELET
Abs Immature Granulocytes: 0.1 10*3/uL — ABNORMAL HIGH (ref 0.00–0.07)
Band Neutrophils: 1 %
Basophils Absolute: 0 10*3/uL (ref 0.0–0.1)
Basophils Relative: 0 %
Eosinophils Absolute: 0.1 10*3/uL (ref 0.0–0.5)
Eosinophils Relative: 2 %
HCT: 24.1 % — ABNORMAL LOW (ref 36.0–46.0)
Hemoglobin: 8.2 g/dL — ABNORMAL LOW (ref 12.0–15.0)
Lymphocytes Relative: 72 %
Lymphs Abs: 3.5 10*3/uL (ref 0.7–4.0)
MCH: 28.4 pg (ref 26.0–34.0)
MCHC: 34 g/dL (ref 30.0–36.0)
MCV: 83.4 fL (ref 80.0–100.0)
Metamyelocytes Relative: 1 %
Monocytes Absolute: 0.2 10*3/uL (ref 0.1–1.0)
Monocytes Relative: 4 %
Neutro Abs: 1 10*3/uL — ABNORMAL LOW (ref 1.7–7.7)
Neutrophils Relative %: 19 %
Platelets: 53 10*3/uL — ABNORMAL LOW (ref 150–400)
Promyelocytes Relative: 1 %
RBC: 2.89 MIL/uL — ABNORMAL LOW (ref 3.87–5.11)
RDW: 15.9 % — ABNORMAL HIGH (ref 11.5–15.5)
WBC: 4.9 10*3/uL (ref 4.0–10.5)
nRBC: 9.1 % — ABNORMAL HIGH (ref 0.0–0.2)

## 2022-03-10 LAB — COMPREHENSIVE METABOLIC PANEL
ALT: 112 U/L — ABNORMAL HIGH (ref 0–44)
AST: 78 U/L — ABNORMAL HIGH (ref 15–41)
Albumin: 2.6 g/dL — ABNORMAL LOW (ref 3.5–5.0)
Alkaline Phosphatase: 79 U/L (ref 38–126)
Anion gap: 8 (ref 5–15)
BUN: 7 mg/dL (ref 6–20)
CO2: 19 mmol/L — ABNORMAL LOW (ref 22–32)
Calcium: 9.1 mg/dL (ref 8.9–10.3)
Chloride: 111 mmol/L (ref 98–111)
Creatinine, Ser: 0.64 mg/dL (ref 0.44–1.00)
GFR, Estimated: >60 mL/min (ref >60)
Glucose, Bld: 90 mg/dL (ref 70–99)
Potassium: 4.2 mmol/L (ref 3.5–5.1)
Sodium: 138 mmol/L (ref 135–145)
Total Bilirubin: 0.5 mg/dL (ref 0.3–1.2)
Total Protein: 5.5 g/dL — ABNORMAL LOW (ref 6.5–8.1)

## 2022-03-10 LAB — LACTATE DEHYDROGENASE: LDH: 368 U/L — ABNORMAL HIGH (ref 98–192)

## 2022-03-10 LAB — MAGNESIUM: Magnesium: 1.9 mg/dL (ref 1.7–2.4)

## 2022-03-10 MED ORDER — ALUM & MAG HYDROXIDE-SIMETH 200-200-20 MG/5ML PO SUSP: 30.0000 mL | Freq: Four times a day (QID) | ORAL | Status: DC | PRN

## 2022-03-10 NOTE — Progress Notes (Signed)
Carla Clark looks fantastic.  She is eating better.  She is out a bed.  She does not complain of any headache.  There is no problems with bowels or bladder.  She is alert and oriented.  She has totally recovered from this neurological issue that she developed.  Her labs do look quite good.  Her white cell count is 4.9.  Hemoglobin 8.2.  Platelet count 53,000.  I think we will have to monitor the platelet count.  I would not transfuse her right now.  Her hemoglobin is also dropped a little bit.  I would not transfuse her right now.  Her LFTs continue to improve.  This tells me that the chemotherapy has worked.  Again, we really need to be aggressive with respect to her treatment.  I know that is a "fine-line" that we walk with respect to how to treat her and how to be aggressive.  It is still not clear as to what triggered this event.  She has had no fever.  She has had no cough or shortness of breath.  She has had no nausea or vomiting.  There has been no obvious bleeding.  All of her vital signs look stable.  Her vital signs are temperature 98.7.  Pulse 87.  Blood pressure 109/78.  Oxygen saturation is 98%.  Her head and neck exam shows no oral lesions.  There is no adenopathy in the neck.  Lungs are clear bilaterally.  She has good air movement.  Cardiac exam regular rate and rhythm.  Abdomen is soft.  Bowel sounds are present.  She has no fluid wave.  There is no palpable liver or spleen tip.  Extremity shows no clubbing, cyanosis or edema.  Neurological exam shows no focal neurological deficits.  Again, it is not clear as to what happened to her.  Again as she had this drop in hemoglobin.  I suppose this might have been a factor.  I do appreciate neurology following closely.  Again, I would hold off on platelet transfusion.  I think we really need to see where she stands tomorrow.  I am sure that there is still involvement of cancer in her bone marrow.  I just do not want to transfuse her too  often as this will lead to rapid alloimmunization.  I am just happy that her quality of life is getting back to where it needs to be.  Obviously, she has had great care from everybody up on 4NP.   [name], MD  Romans 12:12

## 2022-03-10 NOTE — Progress Notes (Signed)
Mobility Specialist Progress Note    03/10/22 0944  Mobility  Activity Ambulated independently in hallway  Level of Assistance Standby assist, set-up cues, supervision of patient - no hands on  Assistive Device None  Distance Ambulated (ft) 400 ft  Activity Response Tolerated well  $Mobility charge 1 Mobility   Pre-Mobility: 75 HR, 99% SpO2  Pt received in bed and agreeable. No complaints on walk. Returned to room with husband.  Hildred Alamin Mobility Specialist  Please Psychologist, sport and exercise or Rehab Office at [phone]

## 2022-03-10 NOTE — Assessment & Plan Note (Signed)
Quiescent. No acute symptoms. The patient is on [facility] as outpatient. It has not been continued as inpatient.

## 2022-03-10 NOTE — Hospital Course (Addendum)
Carla Clark is an 37 y.o. female past medical history significant metastatic to the bone breast cancer with a first chemo treatment about 1 week prior to admission, Crohn's disease comes in with altered mental status, mother provided most of the history she started having epistaxis that would not stop her husband took her to the ER at [facility] she was given IV fluids and platelets she was released at 8 am in the morning as she was coherent.  Woke up went to the bathroom disoriented called her oncologist and related to bring her to [facility], son had a strep throat 4 days prior to admission.   Significant studies: 03/07/2022 CT of the head showed no acute abnormalities. 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 03/07/2022 MRI of the brain showed no acute CVA or metastatic disease there is a right-sided convexity measuring 2.5 cm in thickness. 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   03/07/2022 MRI of the brain study degraded by motion vertebral artery showed convincing dissection with maximal stenosis about 40% extending from C7 to to C4 03/09/2022 Transfusion of 2 units of platelets. 03/10/2022 Oncology has recommended continued observation of the patient's CBC. She states that she is feeling well. 03/11/2022: Discharge to home in fair condition.

## 2022-03-10 NOTE — Assessment & Plan Note (Signed)
Resolved. The patient is awake, alert, and oriented x 3.

## 2022-03-10 NOTE — Progress Notes (Signed)
Cycle 1 day 8 of paclitaxel/Keytruda deleted per Dr. Antonieta Pert instructions. Patient was hospitalized at the time this would have been administered.

## 2022-03-10 NOTE — Assessment & Plan Note (Signed)
S/P transfusion of 2 pheresis packs of platelets.Platelets are 53 today. Oncology recommends continued monitoring of CBC.

## 2022-03-10 NOTE — Progress Notes (Signed)
PROGRESS NOTE  Carla Clark [id number] DOB: 12/10/1985 DOA: 03/07/2022 PCP: Mosie Lukes, MD  Brief History   Carla Clark is an 37 y.o. female past medical history significant metastatic to the bone breast cancer with a first chemo treatment about 1 week prior to admission, Crohn's disease comes in with altered mental status, mother provided most of the history she started having epistaxis that would not stop her husband took her to the ER at [facility] she was given IV fluids and platelets she was released at 8 am in the morning as she was coherent.  Woke up went to the bathroom disoriented called her oncologist and related to bring her to [facility], son had a strep throat 4 days prior to admission.   Significant studies: 03/07/2022 CT of the head showed no acute abnormalities. 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 03/07/2022 MRI of the brain showed no acute CVA or metastatic disease there is a right-sided convexity measuring 2.5 cm in thickness. 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   03/07/2022 MRI of the brain study degraded by motion vertebral artery showed convincing dissection with maximal stenosis about 40% extending from C7 to to C4 03/09/2022 Transfusion of 2 units of platelets. 03/10/2022 Oncology has recommended continued observation of the patient's CBC. She states that she is feeling well.  Consultants  Oncology Infectious Disease PCCM Neurology  Procedures  Transfusion of 2 pheresis packs of platelets.  Antibiotics   Anti-infectives (From admission, onward)    Start     Dose/Rate Route Frequency Ordered Stop   03/08/22 0600  acyclovir (ZOVIRAX) 500 mg in dextrose 5 % 100 mL IVPB  Status:  Discontinued        500 mg 110 mL/hr over 60 Minutes Intravenous Every 8 hours 03/07/22 1722 03/08/22 1318   03/08/22 0200  vancomycin (VANCOREADY) IVPB 500 mg/100 mL  Status:  Discontinued         500 mg 100 mL/hr over 60 Minutes Intravenous Every 8 hours 03/07/22 1744 03/08/22 1318   03/07/22 1923  meropenem (MERREM) 2 g in sodium chloride 0.9 % 100 mL IVPB  Status:  Discontinued        2 g 280 mL/hr over 30 Minutes Intravenous Every 8 hours 03/07/22 1923 03/08/22 1318   03/07/22 1800  acyclovir (ZOVIRAX) 500 mg in dextrose 5 % 100 mL IVPB        500 mg 110 mL/hr over 60 Minutes Intravenous  Once 03/07/22 1713 03/07/22 1947   03/07/22 1730  ampicillin (OMNIPEN) 2 g in sodium chloride 0.9 % 100 mL IVPB        2 g 300 mL/hr over 20 Minutes Intravenous  Once 03/07/22 1652 03/07/22 1839   03/07/22 1715  vancomycin (VANCOCIN) IVPB 1000 mg/200 mL premix        1,000 mg 200 mL/hr over 60 Minutes Intravenous STAT 03/07/22 1709 03/07/22 1830   03/07/22 1630  cefTRIAXone (ROCEPHIN) 2 g in sodium chloride 0.9 % 100 mL IVPB        2 g 200 mL/hr over 30 Minutes Intravenous  Once 03/07/22 1616 03/07/22 1813      Subjective  The patient is resting comfortably. No new complaints.   Objective   Vitals:  Vitals:   03/10/22 1143 03/10/22 1544  BP: 114/73 (!) 111/91  Pulse: 89   Resp: 18 18  Temp: 98.7 F (37.1 C) 99.6 F (37.6 C)  SpO2:  99% 99%    Exam:  Constitutional:  Appears calm and comfortable Respiratory:  CTA bilaterally, no w/r/r.  Respiratory effort normal. No retractions or accessory muscle use Cardiovascular:  RRR, no m/r/g No LE extremity edema   Normal pedal pulses Abdomen:  Abdomen appears normal; no tenderness or masses No hernias No HSM Musculoskeletal:  Digits/nails BUE: no clubbing, cyanosis, petechiae, infection exam of joints, bones, muscles of at least one of following: head/neck, RUE, LUE, RLE, LLE   strength and tone normal, no atrophy, no abnormal movements No tenderness, masses Normal ROM, no contractures  gait and station Skin:  No rashes, lesions, ulcers palpation of skin: no induration or nodules Neurologic:  CN 2-12  intact Sensation all 4 extremities intact Psychiatric:  Mental status Mood, affect appropriate Orientation to person, place, time  judgment and insight appear intact     I have personally reviewed the following:   Today's Data   Vitals:   03/10/22 1143 03/10/22 1544  BP: 114/73 (!) 111/91  Pulse: 89   Resp: 18 18  Temp: 98.7 F (37.1 C) 99.6 F (37.6 C)  SpO2: 99% 99%     Lab Data  CBC    Component Value Date/Time   WBC 4.9 03/10/2022 0532   RBC 2.89 (L) 03/10/2022 0532   HGB 8.2 (L) 03/10/2022 0532   HGB 10.0 (L) 03/03/2022 0810   HGB 12.0 01/11/2017 1514   HCT 24.1 (L) 03/10/2022 0532   HCT 36.7 01/11/2017 1514   PLT 53 (L) 03/10/2022 0532   PLT 28 (L) 03/03/2022 0810   PLT 313 01/11/2017 1514   MCV 83.4 03/10/2022 0532   MCV 89 01/11/2017 1514   MCH 28.4 03/10/2022 0532   MCHC 34.0 03/10/2022 0532   RDW 15.9 (H) 03/10/2022 0532   RDW 12.7 01/11/2017 1514   LYMPHSABS 3.5 03/10/2022 0532   LYMPHSABS 2.9 01/11/2017 1514   MONOABS 0.2 03/10/2022 0532   EOSABS 0.1 03/10/2022 0532   EOSABS 0.2 01/11/2017 1514   BASOSABS 0.0 03/10/2022 0532   BASOSABS 0.0 01/11/2017 1514      Latest Ref Rng & Units 03/10/2022    5:32 AM 03/09/2022    3:28 AM 03/08/2022    5:00 AM  BMP  Glucose 70 - 99 mg/dL 90  103  88   BUN 6 - 20 mg/dL 7  9  10   $ Creatinine 0.44 - 1.00 mg/dL 0.64  0.75  0.71   Sodium 135 - 145 mmol/L 138  136  134   Potassium 3.5 - 5.1 mmol/L 4.2  4.2  4.4   Chloride 98 - 111 mmol/L 111  107  104   CO2 22 - 32 mmol/L 19  22  22   $ Calcium 8.9 - 10.3 mg/dL 9.1  9.0  8.9      Micro Data   Results for orders placed or performed during the hospital encounter of 03/07/22  Resp panel by RT-PCR (RSV, Flu A&B, Covid) Anterior Nasal Swab     Status: None   Collection Time: 03/07/22 11:53 AM   Specimen: Anterior Nasal Swab  Result Value Ref Range Status   SARS Coronavirus 2 by RT PCR NEGATIVE NEGATIVE Final    Comment: (NOTE) SARS-CoV-2 target nucleic  acids are NOT DETECTED.  The SARS-CoV-2 RNA is generally detectable in upper respiratory specimens during the acute phase of infection. The lowest concentration of SARS-CoV-2 viral copies this assay can detect is 138 copies/mL. A negative result does not preclude SARS-Cov-2 infection  and should not be used as the sole basis for treatment or other patient management decisions. A negative result may occur with  improper specimen collection/handling, submission of specimen other than nasopharyngeal swab, presence of viral mutation(s) within the areas targeted by this assay, and inadequate number of viral copies(<138 copies/mL). A negative result must be combined with clinical observations, patient history, and epidemiological information. The expected result is Negative.  Fact Sheet for Patients:  EntrepreneurPulse.com.au  Fact Sheet for Healthcare Providers:  IncredibleEmployment.be  This test is no t yet approved or cleared by the [place] FDA and  has been authorized for detection and/or diagnosis of SARS-CoV-2 by FDA under an Emergency Use Authorization (EUA). This EUA will remain  in effect (meaning this test can be used) for the duration of the COVID-19 declaration under Section 564(b)(1) of the Act, 21 U.S.C.section 360bbb-3(b)(1), unless the authorization is terminated  or revoked sooner.       Influenza A by PCR NEGATIVE NEGATIVE Final   Influenza B by PCR NEGATIVE NEGATIVE Final    Comment: (NOTE) The Xpert Xpress SARS-CoV-2/FLU/RSV plus assay is intended as an aid in the diagnosis of influenza from Nasopharyngeal swab specimens and should not be used as a sole basis for treatment. Nasal washings and aspirates are unacceptable for Xpert Xpress SARS-CoV-2/FLU/RSV testing.  Fact Sheet for Patients: EntrepreneurPulse.com.au  Fact Sheet for Healthcare Providers: IncredibleEmployment.be  This  test is not yet approved or cleared by the [place] FDA and has been authorized for detection and/or diagnosis of SARS-CoV-2 by FDA under an Emergency Use Authorization (EUA). This EUA will remain in effect (meaning this test can be used) for the duration of the COVID-19 declaration under Section 564(b)(1) of the Act, 21 U.S.C. section 360bbb-3(b)(1), unless the authorization is terminated or revoked.     Resp Syncytial Virus by PCR NEGATIVE NEGATIVE Final    Comment: (NOTE) Fact Sheet for Patients: EntrepreneurPulse.com.au  Fact Sheet for Healthcare Providers: IncredibleEmployment.be  This test is not yet approved or cleared by the [place] FDA and has been authorized for detection and/or diagnosis of SARS-CoV-2 by FDA under an Emergency Use Authorization (EUA). This EUA will remain in effect (meaning this test can be used) for the duration of the COVID-19 declaration under Section 564(b)(1) of the Act, 21 U.S.C. section 360bbb-3(b)(1), unless the authorization is terminated or revoked.  Performed at [facility], [place] [address] [place], [place] [address]   Group A Strep by PCR     Status: None   Collection Time: 03/07/22  4:30 PM   Specimen: Throat; Sterile Swab  Result Value Ref Range Status   Group A Strep by PCR NOT DETECTED NOT DETECTED Final    Comment: Performed at [facility], [place] [address] [place], [place] [address]  Blood culture (routine x 2)     Status: None (Preliminary result)   Collection Time: 03/07/22  4:50 PM   Specimen: BLOOD  Result Value Ref Range Status   Specimen Description   Final    BLOOD RIGHT ANTECUBITAL Performed at [place] [address] [place], [place] [address]    Special Requests   Final    BOTTLES DRAWN AEROBIC AND ANAEROBIC Blood Culture adequate volume Performed at [place] [address]  [place], [place] [address]    Culture   Final    NO GROWTH 3 DAYS Performed at [facility], [place] [address], [place], [place] [address]  Report Status PENDING  Incomplete  Blood culture (routine x 2)     Status: None (Preliminary result)   Collection Time: 03/07/22  9:59 PM   Specimen: BLOOD  Result Value Ref Range Status   Specimen Description BLOOD RIGHT ANTECUBITAL  Final   Special Requests   Final    BOTTLES DRAWN AEROBIC AND ANAEROBIC Blood Culture results may not be optimal due to an inadequate volume of blood received in culture bottles   Culture   Final    NO GROWTH 3 DAYS Performed at [facility], [place] [address], [place], [place] [address]    Report Status PENDING  Incomplete  CSF culture w Gram Stain     Status: None (Preliminary result)   Collection Time: 03/07/22 11:03 PM   Specimen: CSF; Cerebrospinal Fluid  Result Value Ref Range Status   Specimen Description CSF  Final   Special Requests NONE  Final   Gram Stain NO WBC SEEN NO ORGANISMS SEEN CYTOSPIN SMEAR   Final   Culture   Final    NO GROWTH 2 DAYS Performed at [facility], [place] [address], [place], [place] [address]    Report Status PENDING  Incomplete    Scheduled Meds:  Chlorhexidine Gluconate Cloth  6 each Topical Q2000   docusate sodium  100 mg Oral BID   sodium chloride flush  3 mL Intravenous Q12H   Continuous Infusions:  sodium chloride 100 mL/hr at 03/10/22 1244    Principal Problem:   Acute metabolic encephalopathy Active Problems:   Crohn's disease ([place])   Breast cancer metastasized to multiple sites, left ([place])   Thrombocytopenia ([place])   LOS: 3 days   A & P  Acute metabolic encephalopathy Resolved. The patient is awake, alert, and oriented x 3.  Breast cancer metastasized to multiple sites, left ([place]) Noted. Oncology assistance is appreciated.  Crohn's disease ([place]) Quiescent. No acute symptoms. The patient is on [facility] as outpatient. It has not been continued  as inpatient.  Thrombocytopenia ([facility]) S/P transfusion of 2 pheresis packs of platelets.Platelets are 53 today. Oncology recommends continued monitoring of CBC.   DVT prophylaxis: SCD's Code Status: Full Code Family Communication: Friend at bedside Disposition Plan: home    Arelis Neumeier, DO [facility] Direct contact: see [email or web address]  7PM-7AM contact night coverage as above 03/10/2022, 5:10 PM  LOS: 3 days

## 2022-03-10 NOTE — Progress Notes (Signed)
Speech Language Pathology Treatment: Dysphagia  Patient Details Name: Carla Clark MRN: [id number] DOB: 13-Mar-1985 Today's Date: 03/10/2022 Time: [id number] SLP Time Calculation (min) (ACUTE ONLY): 16 min  Assessment / Plan / Recommendation Clinical Impression  Pt seen today much more alert. She reports eating and drinking with no difficulty over the last two days. Pt observed with trials of thin liquids, purees, and solids with no s/s of dysphagia or aspiration. Mentation is no longer a concern at this time. Recommend continue with regular diet and thin liquids and SLP will s/o.    HPI HPI: Pt is a 37 yo female presenting to ED with AMS and L sided weakness. 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 PMH includes metastatic breast cancer (started chemo 2/8), GERD, and Crohn's disease.      SLP Plan  All goals met      Recommendations for follow up therapy are one component of a multi-disciplinary discharge planning process, led by the attending physician.  Recommendations may be updated based on patient status, additional functional criteria and insurance authorization.    Recommendations  Diet recommendations: Regular;Thin liquid Liquids provided via: Cup;Straw Medication Administration: Whole meds with liquid Supervision: Patient able to self feed Compensations: Minimize environmental distractions;Slow rate;Small sips/bites Postural Changes and/or Swallow Maneuvers: Seated upright 90 degrees;Upright 30-60 min after meal                Oral Care Recommendations: Oral care BID Follow Up Recommendations: No SLP follow up SLP Visit Diagnosis: Dysphagia, unspecified (R13.10) Plan: All goals met           Fabio Asa., Student SLP  03/10/2022, 11:51 AM

## 2022-03-10 NOTE — Assessment & Plan Note (Signed)
Noted. Oncology assistance is appreciated.

## 2022-03-11 ENCOUNTER — Encounter: Payer: Self-pay | Admitting: *Deleted

## 2022-03-11 DIAGNOSIS — G9341 Metabolic encephalopathy: Secondary | ICD-10-CM | POA: Diagnosis not present

## 2022-03-11 LAB — BPAM PLATELET PHERESIS
Blood Product Expiration Date: 202402172359
Blood Product Expiration Date: 202402182359
ISSUE DATE / TIME: 202402161136
Unit Type and Rh: 6200
Unit Type and Rh: 6200

## 2022-03-11 LAB — COMPREHENSIVE METABOLIC PANEL
ALT: 109 U/L — ABNORMAL HIGH (ref 0–44)
AST: 75 U/L — ABNORMAL HIGH (ref 15–41)
Albumin: 2.7 g/dL — ABNORMAL LOW (ref 3.5–5.0)
Alkaline Phosphatase: 91 U/L (ref 38–126)
Anion gap: 8 (ref 5–15)
BUN: 7 mg/dL (ref 6–20)
CO2: 20 mmol/L — ABNORMAL LOW (ref 22–32)
Calcium: 8.9 mg/dL (ref 8.9–10.3)
Chloride: 111 mmol/L (ref 98–111)
Creatinine, Ser: 0.66 mg/dL (ref 0.44–1.00)
GFR, Estimated: >60 mL/min (ref >60)
Glucose, Bld: 88 mg/dL (ref 70–99)
Potassium: 4 mmol/L (ref 3.5–5.1)
Sodium: 139 mmol/L (ref 135–145)
Total Bilirubin: 0.5 mg/dL (ref 0.3–1.2)
Total Protein: 5.7 g/dL — ABNORMAL LOW (ref 6.5–8.1)

## 2022-03-11 LAB — CBC WITH DIFFERENTIAL/PLATELET
Abs Immature Granulocytes: 0.11 10*3/uL — ABNORMAL HIGH (ref 0.00–0.07)
Basophils Absolute: 0 10*3/uL (ref 0.0–0.1)
Basophils Relative: 1 %
Eosinophils Absolute: 0.1 10*3/uL (ref 0.0–0.5)
Eosinophils Relative: 1 %
HCT: 24.4 % — ABNORMAL LOW (ref 36.0–46.0)
Hemoglobin: 8.1 g/dL — ABNORMAL LOW (ref 12.0–15.0)
Immature Granulocytes: 2 %
Lymphocytes Relative: 82 %
Lymphs Abs: 4.8 10*3/uL — ABNORMAL HIGH (ref 0.7–4.0)
MCH: 28 pg (ref 26.0–34.0)
MCHC: 33.2 g/dL (ref 30.0–36.0)
MCV: 84.4 fL (ref 80.0–100.0)
Monocytes Absolute: 0.3 10*3/uL (ref 0.1–1.0)
Monocytes Relative: 5 %
Neutro Abs: 0.5 10*3/uL — ABNORMAL LOW (ref 1.7–7.7)
Neutrophils Relative %: 9 %
Platelets: 42 10*3/uL — ABNORMAL LOW (ref 150–400)
RBC: 2.89 MIL/uL — ABNORMAL LOW (ref 3.87–5.11)
RDW: 15.9 % — ABNORMAL HIGH (ref 11.5–15.5)
WBC: 5.8 10*3/uL (ref 4.0–10.5)
nRBC: 16 % — ABNORMAL HIGH (ref 0.0–0.2)

## 2022-03-11 LAB — CSF CULTURE W GRAM STAIN
Culture: NO GROWTH
Gram Stain: NONE SEEN

## 2022-03-11 LAB — MAGNESIUM: Magnesium: 1.9 mg/dL (ref 1.7–2.4)

## 2022-03-11 LAB — LACTATE DEHYDROGENASE: LDH: 364 U/L — ABNORMAL HIGH (ref 98–192)

## 2022-03-11 MED ORDER — DOCUSATE SODIUM 100 MG PO CAPS: 100.0000 mg | ORAL_CAPSULE | Freq: Two times a day (BID) | ORAL | 0 refills | Status: DC

## 2022-03-11 MED ORDER — FUROSEMIDE 10 MG/ML IJ SOLN: 20.0000 mg | Freq: Once | INTRAMUSCULAR | Status: DC

## 2022-03-11 MED ORDER — POLYETHYLENE GLYCOL 3350 17 G PO PACK: 17.0000 g | PACK | Freq: Every day | ORAL | 0 refills | Status: DC | PRN

## 2022-03-11 MED ORDER — SODIUM CHLORIDE 0.9% IV SOLUTION: Freq: Once | INTRAVENOUS | Status: DC

## 2022-03-11 MED ORDER — HEPARIN SOD (PORK) LOCK FLUSH 100 UNIT/ML IV SOLN
500.0000 [IU] | INTRAVENOUS | Status: AC | PRN
  Administered 2022-03-11: 500 [IU]

## 2022-03-11 NOTE — Progress Notes (Signed)
Patient alert and oriented, verbalized understanding of dc instructions. All belongings and dc instructions given to patient.

## 2022-03-11 NOTE — Progress Notes (Addendum)
[name] looks fantastic.  She really has come around nicely.  She may have little bit of a headache.  She feels as how she is laying down in bed.  She is out of bed.  She is walking.  She has no problems going to the bathroom.  I do not have back her CBC yet today.  I think this will be important.  She is eating nicely.  There is no nausea or vomiting.  Her LFTs have improved incredibly well.  Today, her SGPT is 109 and SGOT 75.  Her albumin is 2.7.  Hopefully this will improve as she continues to eat better.  Her LDH is 364.  I really need to see back her CBC to see if we have to transfuse her.  Overall, I would have to think that she should be able to go home.  We can certainly get treatment going early next week for her.  I cannot imagine that what happened to her this past week will happen to her again.  She is in somewhat better shape now.  Her liver studies are improved.  Her LDH has come down well.  Hopefully, this will also translate to improved CA 27.29.  Her vital signs show temperature 98.1.  Pulse 89.  Blood pressure 112/73.  Her head and neck exam shows no ocular or oral lesions.  There are no palpable cervical or supraclavicular lymph nodes.  Lungs are clear bilaterally.  Cardiac exam regular rate and rhythm.  Abdomen is soft.  Bowel sounds are present.  There is no palpable liver or spleen tip.  Extremity shows no clubbing, cyanosis or edema.  She has good range of motion of her joints.  She has good strength.  Neurological exam shows no focal neurological deficits.  [name] has some unclear and neurological event.  I am still not sure how this vertebral artery dissection played into everything.  CSF is negative.  There is no evidence of metastatic disease to the brain.  Her LFTs have improved very nicely just after 1 cycle of treatment.  Hopefully, she will be able to go home today.  I know she is looking forward to being able to go home and spend time with her  children.  We can always get her back to the office next week for treatment.  We certainly want to be aggressive with respect to her therapy.  [name], MD  [place] 7: 7-8  ADDENDUM: I see that her CBC probably came back.  Her hemoglobin is 8.1.  Platelet count is 42,000.  I do think that we probably do need to give her 1 unit of packed red blood cells and 1 unit of platelets before she does go home.  I think this would be helpful given the neurologic picture that she went through.  I just think that we need to maintain her blood count had a little bit of a higher level.   [name], MD

## 2022-03-11 NOTE — Progress Notes (Signed)
Patient was discharged from the hospital today. Will be seen in the office on 03/14/2022 to initiate her next treatment cycle   Oncology Nurse Navigator Documentation     03/11/2022   12:45 PM  Oncology Nurse Navigator Flowsheets  Navigator Follow Up Date: 03/14/2022  Navigator Follow Up Reason: Follow-up After Biopsy;Chemotherapy  Navigator Location [facility]  Navigator Encounter Type Appt/Treatment Plan Review  Patient Visit Type MedOnc  Treatment Phase Active Tx  Barriers/Navigation Needs Coordination of Care;Education  Interventions None Required  Acuity Level 2-Minimal Needs (1-2 Barriers Identified)  Support Groups/Services Friends and Family  Time Spent with Patient 15

## 2022-03-11 NOTE — Discharge Summary (Signed)
Physician Discharge Summary   Patient: Carla Clark MRN: [id number] DOB: 09-03-1985  Admit date:     03/07/2022  Discharge date: 03/11/22  Discharge Physician: Karie Kirks   PCP: Mosie Lukes, MD   Recommendations at discharge:    Discharge to home Follow up with Dr. Marin Olp in one week. CBC and CMP to be drawn at that visit. Follow up with PCP in 7-10 days. Restart Skyrizi as per Dr. Antonieta Pert instructions.  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 *  Hospital Course: Carla Clark is an 37 y.o. female past medical history significant metastatic to the bone breast cancer with a first chemo treatment about 1 week prior to admission, Crohn's disease comes in with altered mental status, mother provided most of the history she started having epistaxis that would not stop her husband took her to the ER at [facility] she was given IV fluids and platelets she was released at 8 am in the morning as she was coherent.  Woke up went to the bathroom disoriented called her oncologist and related to bring her to [facility], son had a strep throat 4 days prior to admission.   Significant studies: 03/07/2022 CT of the head showed no acute abnormalities. 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 03/07/2022 MRI of the brain showed no acute CVA or metastatic disease there is a right-sided convexity measuring 2.5 cm in thickness. 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   03/07/2022 MRI of the brain study degraded by motion vertebral artery showed convincing dissection with maximal stenosis about 40% extending from C7 to to C4 03/09/2022 Transfusion of 2 units of platelets. 03/10/2022 Oncology has recommended  continued observation of the patient's CBC. She states that she is feeling well. 03/11/2022: Discharge to home in fair condition.  Assessment and Plan: * Acute metabolic encephalopathy Resolved. The patient is awake, alert, and oriented x 3.  Thrombocytopenia (HCC) S/P transfusion of 2 pheresis packs of platelets.Platelets are 53 today. Oncology recommends continued monitoring of CBC.   Breast cancer metastasized to multiple sites, left ([place]) Noted. Oncology assistance is appreciated.  Crohn's disease ([place]) Quiescent. No acute symptoms. The patient is on [facility] as outpatient. It has not been continued as inpatient.   I have seen and examined this patient myself. I have spent 38 minutes in her evaluation and discharge.   Consultants: Oncology, Infectious Disease Procedures performed: None  Disposition: Home Diet recommendation:  Discharge Diet Orders (From admission, onward)     Start     Ordered   03/11/22 0000  Diet - low sodium heart healthy        03/11/22 0851           Regular diet DISCHARGE MEDICATION: Allergies as of 03/11/2022       Reactions   Other Other (See Comments)   History of Crohn's disease- NO seeds, tomatoes, anything that doesn't digest well, etc.....        Medication List     STOP taking these medications    Skyrizi 600 MG/10ML injection Generic drug: risankizumab-rzaa       TAKE these medications    cyanocobalamin 100 MCG tablet Commonly known as: VITAMIN B12 Take 100 mcg by mouth daily.   docusate sodium  100 MG capsule Commonly known as: COLACE Take 1 capsule (100 mg total) by mouth 2 (two) times daily.   hydrocortisone 2.5 % rectal cream Commonly known as: ANUSOL-HC Place 1 Application rectally 2 (two) times daily. What changed:  when to take this reasons to take this   lidocaine-prilocaine cream Commonly known as: EMLA Apply 1 Application topically See admin instructions. Apply a dime-size of cream to port-a-cath 1-2  hours prior to access. Cover with [employer].   ondansetron 8 MG tablet Commonly known as: Zofran Take 1 tablet (8 mg total) by mouth every 8 (eight) hours as needed for nausea or vomiting.   polyethylene glycol 17 g packet Commonly known as: MIRALAX / GLYCOLAX Take 17 g by mouth daily as needed for mild constipation.   prochlorperazine 10 MG tablet Commonly known as: COMPAZINE Take 1 tablet (10 mg total) by mouth every 6 (six) hours as needed for nausea or vomiting.   pyridoxine 250 MG tablet Commonly known as: Neuro-K-250 Vitamin B6 Take 2 tablets (500 mg total) by mouth daily.   traMADol 50 MG tablet Commonly known as: ULTRAM Take 50 mg by mouth every 6 (six) hours as needed (for pain).        Discharge Exam: Filed Weights   03/07/22 1146  Weight: 54.3 kg   Exam:  Constitutional:  The patient is awake, alert, and oriented x 3. No acute distress. Respiratory:  No increased work of breathing. No wheezes, rales, or rhonchi No tactile fremitus Cardiovascular:  Regular rate and rhythm No murmurs, ectopy, or gallups. No lateral PMI. No thrills. Abdomen:  Abdomen is soft, non-tender, non-distended No hernias, masses, or organomegaly Normoactive bowel sounds.  Musculoskeletal:  No cyanosis, clubbing, or edema Skin:  No rashes, lesions, ulcers palpation of skin: no induration or nodules Neurologic:  CN 2-12 intact Sensation all 4 extremities intact Psychiatric:  Mental status Mood, affect appropriate Orientation to person, place, time  judgment and insight appear intact   Condition at discharge: fair  The results of significant diagnostics from this hospitalization (including imaging, microbiology, ancillary and laboratory) are listed below for reference.   Imaging Studies: Overnight EEG with video  Result Date: 03/08/2022 Lora Havens, MD     03/09/2022  9:09 AM Patient Name: Carla Clark MRN: [id number] Epilepsy Attending: Lora Havens  Referring Physician/Provider: Donnetta Simpers, MD Duration: 03/08/2022 0105 to 03/09/2022 0105 Patient history:  37 y.o. female with aggressive stage 4 breast cancer with no known brain mets recently started chemo with [place] and paclitaxel who presented with confusion and aphasia. EEG to evaluate for seizure. Level of alertness: Awake, asleep AEDs during EEG study: None Technical aspects: This EEG study was done with scalp electrodes positioned according to the 10-20 International system of electrode placement. Electrical activity was reviewed with band pass filter of 1-70Hz$ , sensitivity of 7 uV/mm, display speed of 32m/sec with a 60Hz$  notched filter applied as appropriate. EEG data were recorded continuously and digitally stored.  Video monitoring was available and reviewed as appropriate. Description: The posterior dominant rhythm consists of 8 Hz activity of moderate voltage (25-35 uV) seen predominantly in posterior head regions, symmetric and reactive to eye opening and eye closing. Sleep was characterized by vertex waves, sleep spindles (12 to 14 Hz), maximal frontocentral region. EEG showed continuous generalized 3 to 6 Hz theta-delta slowing in right temporal region. Intermittent generalized 3-5hz$  theta-delta slowing was also noted. Hyperventilation and photic stimulation were not performed.   ABNORMALITY - Continuous slow, right  temporal region - Intermittent slow, generalized IMPRESSION: This study is suggestive of cortical dysfunction arising from right temporal region likely secondary to underlying structural abnormality. Additionally there is mild diffuse encephalopathy, nonspecific etiology. No seizures or epileptiform discharges were seen throughout the recording. Lora Havens   MR BRAIN W WO CONTRAST  Result Date: 03/07/2022 CLINICAL DATA:  Transient ischemic attack. Stroke. Altered mental status. Abnormal left vertebral artery. EXAM: MRI HEAD WITHOUT AND WITH CONTRAST TECHNIQUE:  Multiplanar, multiecho pulse sequences of the brain and surrounding structures were obtained without and with intravenous contrast. CONTRAST:  5.62m GADAVIST GADOBUTROL 1 MMOL/ML IV SOLN COMPARISON:  CT studies earlier same day. FINDINGS: Brain: Diffusion imaging does not show any acute or subacute infarction. No focal abnormality is seen affecting the brainstem or cerebellum. The cerebral hemispheres appear normal without evidence of cortical or large vessel stroke or metastatic lesion. Patient has a right-sided holo convexity subdural collection measuring 2.5 mm in thickness, not visible on the CT scan. This is most consistent with a late subacute or chronic subdural collection. The postcontrast imaging is degraded by motion but I do not see any sign of dural enhancement. Whereas it is not possible to exclude dural tumor in this case, I do not have any positive evidence for that. If we were dealing with dural metastatic disease and might expect some finding on the left. No hydrocephalus.  No midline shift Vascular: Major vessels at the base of the brain show flow. Skull and upper cervical spine: No osseous disease is discernible in the region of the calvarium, skull base or upper cervical spine. Sinuses/Orbits: Clear/normal Other: None IMPRESSION: 1. No evidence of acute stroke or parenchymal metastatic disease. 2. Right-sided holo convexity subdural collection measuring 2.5 mm in thickness, not visible on the CT scan. 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 I do not see any sign of dural enhancement, though the postcontrast imaging is markedly degraded by motion. Whereas it is not possible to exclude dural tumor in this case given the imaging we have, I do not have any positive evidence for that. If we were dealing with dural metastatic disease I might also expect some finding on the left. One could consider repeating the postcontrast imaging when the patient is in a condition  that would allow motion free scanning. 3. Case discussed with Dr. SQuinn Axeat about 1545 hours. Electronically Signed   By: MNelson ChimesM.D.   On: 03/07/2022 15:55   MR ANGIO NECK W WO CONTRAST  Result Date: 03/07/2022 CLINICAL DATA:  Neuro deficit, acute, stroke suspected. Possible vertebral occlusion on CT angiography of the neck. History of metastatic breast cancer. EXAM: MRA NECK WITHOUT AND WITH CONTRAST TECHNIQUE: Multiplanar and multiecho pulse sequences of the neck were obtained without and with intravenous contrast. Angiographic images of the neck were obtained using MRA technique without and with intravenous contrast. CONTRAST:  5.533mGADAVIST GADOBUTROL 1 MMOL/ML IV SOLN COMPARISON:  CT angiography same day FINDINGS: Contrast enhanced portion of the examination is markedly degraded by motion. Precontrast time-of-flight imaging is better quality. Unfortunately, the technologist did not perform the requested T1 axial fat-sat imaging. 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 Maximal stenosis of the true lumen in that region is about 40%, to estimation. Otherwise, the findings in the region are normal. The carotid bifurcation regions are normal. The right vertebral artery is normal. IMPRESSION: 1.  Motion degraded contrast enhanced portion of the study. 2. Precontrast T1 fat saturated axial imaging was not done as requested. However, the left vertebral artery does show a fairly convincing dissection with maximal stenosis of about 40%., based on the axial time-of-flight imaging. This extends from the level of about C7 to the level of about C4. 3. Normal right vertebral artery. 4. Normal carotid bifurcation regions. Electronically Signed   By: Nelson Chimes M.D.   On: 03/07/2022 15:30   CT ANGIO HEAD NECK W WO CM W PERF (CODE STROKE)  Addendum Date: 03/07/2022   ADDENDUM REPORT: 03/07/2022 15:26 ADDENDUM:  CT perfusion was also performed. This demonstrates reported small (12 mL) of penumbra which may be artifactual given located in multiple vascular territories and also in the inferior temporal lobes which is often artifactual. An MRI could better assess for acute infarct. No evidence of core infarct by CT perfusion. Electronically Signed   By: Margaretha Sheffield M.D.   On: 03/07/2022 15:26   Result Date: 03/07/2022 CLINICAL DATA:  Neuro deficit, acute, stroke suspected EXAM: CT ANGIOGRAPHY HEAD AND NECK TECHNIQUE: Multidetector CT imaging of the head and neck was performed using the standard protocol during bolus administration of intravenous contrast. Multiplanar CT image reconstructions and MIPs were obtained to evaluate the vascular anatomy. Carotid stenosis measurements (when applicable) are obtained utilizing NASCET criteria, using the distal internal carotid diameter as the denominator. RADIATION DOSE REDUCTION: This exam was performed according to the departmental dose-optimization program which includes automated exposure control, adjustment of the mA and/or kV according to patient size and/or use of iterative reconstruction technique. CONTRAST:  170m OMNIPAQUE IOHEXOL 350 MG/ML SOLN COMPARISON:  None Available. FINDINGS: CTA NECK FINDINGS Aortic arch: Great vessel origins are patent without significant stenosis. Right carotid system: No evidence of dissection, stenosis (50% or greater), or occlusion. Left carotid system: No evidence of dissection, stenosis (50% or greater), or occlusion. Vertebral arteries: Luminal irregularity and eccentric soft tissue attenuation in the left V2 vertebral artery from C4-C5 to C6-C7, which is concerning for dissection and possibly intramural hematoma (for example see series 9, image 118; series 7, images 242 through 192). Right vertebral artery is patent without significant stenosis or evidence of dissection. Skeleton: Extensive osseous metastatic disease. Other neck: No  acute findings on limited assessment. Upper chest: Visualized lung apices are clear. Please see prior PET-CT for evaluation of adenopathy. Review of the MIP images confirms the above findings CTA HEAD FINDINGS Anterior circulation: Bilateral intracranial ICAs, MCAs, and ACAs are patent without proximal hemodynamically significant stenosis Posterior circulation: Bilateral intradural vertebral arteries, basilar artery and bilateral posterior cerebral arteries are patent without proximal hemodynamically significant stenosis. Venous sinuses: As permitted by contrast timing, patent. Review of the MIP images confirms the above findings IMPRESSION: 1. 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 Resulting moderate to severe stenosis of the vertebral artery. An MRA of the neck with axial T1 fat saturation may be helpful to further evaluate if clinically warranted. 2. Extensive osseous metastatic disease. Findings discussed with Dr. SQuinn Axevia telephone at 12:55 p.m. Electronically Signed: By: FMargaretha SheffieldM.D. On: 03/07/2022 13:03   DG Chest Portable 1 View  Result Date: 03/07/2022 CLINICAL DATA:  Breast cancer EXAM: PORTABLE CHEST 1 VIEW COMPARISON:  PET-CT 01/21/2022 FINDINGS: Right-sided Port-A-Cath with the tip projecting over the SVC. No focal consolidation. No pleural effusion or pneumothorax. Heart and mediastinal contours are unremarkable. No acute osseous abnormality. IMPRESSION: No active disease. Electronically  Signed   By: Kathreen Devoid M.D.   On: 03/07/2022 13:44   CT HEAD CODE STROKE WO CONTRAST  Result Date: 03/07/2022 CLINICAL DATA:  Code stroke.  Neuro deficit, acute, stroke suspected EXAM: CT HEAD WITHOUT CONTRAST TECHNIQUE: Contiguous axial images were obtained from the base of the skull through the vertex without intravenous contrast. RADIATION DOSE REDUCTION: This exam was performed according to the departmental dose-optimization program which  includes automated exposure control, adjustment of the mA and/or kV according to patient size and/or use of iterative reconstruction technique. COMPARISON:  None Available. FINDINGS: Brain: No evidence of acute large vascular territory infarction, hemorrhage, hydrocephalus, extra-axial collection or mass lesion/mass effect. Vascular: No hyperdense vessel identified. Skull: No acute fracture. Sinuses/Orbits: Clear sinuses.  No acute orbital findings. Other: No mastoid effusions. ASPECTS [facility] Stroke Program Early CT Score) total score (0-10 with 10 being normal): 10. IMPRESSION: 1. No evidence of acute intracranial abnormality. 2. ASPECTS is 10. Code stroke imaging results were communicated on 03/07/2022 at 12:32 pm to provider Pearline Cables via telephone, who verbally acknowledged these results. Electronically Signed   By: Margaretha Sheffield M.D.   On: 03/07/2022 12:33    Microbiology: Results for orders placed or performed during the hospital encounter of 03/07/22  Resp panel by RT-PCR (RSV, Flu A&B, Covid) Anterior Nasal Swab     Status: None   Collection Time: 03/07/22 11:53 AM   Specimen: Anterior Nasal Swab  Result Value Ref Range Status   SARS Coronavirus 2 by RT PCR NEGATIVE NEGATIVE Final    Comment: (NOTE) SARS-CoV-2 target nucleic acids are NOT DETECTED.  The SARS-CoV-2 RNA is generally detectable in upper respiratory specimens during the acute phase of infection. The lowest concentration of SARS-CoV-2 viral copies this assay can detect is 138 copies/mL. A negative result does not preclude SARS-Cov-2 infection and should not be used as the sole basis for treatment or other patient management decisions. A negative result may occur with  improper specimen collection/handling, submission of specimen other than nasopharyngeal swab, presence of viral mutation(s) within the areas targeted by this assay, and inadequate number of viral copies(<138 copies/mL). A negative result must be combined  with clinical observations, patient history, and epidemiological information. The expected result is Negative.  Fact Sheet for Patients:  EntrepreneurPulse.com.au  Fact Sheet for Healthcare Providers:  IncredibleEmployment.be  This test is no t yet approved or cleared by the [place] FDA and  has been authorized for detection and/or diagnosis of SARS-CoV-2 by FDA under an Emergency Use Authorization (EUA). This EUA will remain  in effect (meaning this test can be used) for the duration of the COVID-19 declaration under Section 564(b)(1) of the Act, 21 U.S.C.section 360bbb-3(b)(1), unless the authorization is terminated  or revoked sooner.       Influenza A by PCR NEGATIVE NEGATIVE Final   Influenza B by PCR NEGATIVE NEGATIVE Final    Comment: (NOTE) The Xpert Xpress SARS-CoV-2/FLU/RSV plus assay is intended as an aid in the diagnosis of influenza from Nasopharyngeal swab specimens and should not be used as a sole basis for treatment. Nasal washings and aspirates are unacceptable for Xpert Xpress SARS-CoV-2/FLU/RSV testing.  Fact Sheet for Patients: EntrepreneurPulse.com.au  Fact Sheet for Healthcare Providers: IncredibleEmployment.be  This test is not yet approved or cleared by the [place] FDA and has been authorized for detection and/or diagnosis of SARS-CoV-2 by FDA under an Emergency Use Authorization (EUA). This EUA will remain in effect (meaning this test can be used) for the  duration of the COVID-19 declaration under Section 564(b)(1) of the Act, 21 U.S.C. section 360bbb-3(b)(1), unless the authorization is terminated or revoked.     Resp Syncytial Virus by PCR NEGATIVE NEGATIVE Final    Comment: (NOTE) Fact Sheet for Patients: EntrepreneurPulse.com.au  Fact Sheet for Healthcare Providers: IncredibleEmployment.be  This test is not yet approved  or cleared by the [place] FDA and has been authorized for detection and/or diagnosis of SARS-CoV-2 by FDA under an Emergency Use Authorization (EUA). This EUA will remain in effect (meaning this test can be used) for the duration of the COVID-19 declaration under Section 564(b)(1) of the Act, 21 U.S.C. section 360bbb-3(b)(1), unless the authorization is terminated or revoked.  Performed at [facility], [place] [address] [place], [place] [address]   Group A Strep by PCR     Status: None   Collection Time: 03/07/22  4:30 PM   Specimen: Throat; Sterile Swab  Result Value Ref Range Status   Group A Strep by PCR NOT DETECTED NOT DETECTED Final    Comment: Performed at [facility], [place] [address] [place], [place] [address]  Blood culture (routine x 2)     Status: None (Preliminary result)   Collection Time: 03/07/22  4:50 PM   Specimen: BLOOD  Result Value Ref Range Status   Specimen Description   Final    BLOOD RIGHT ANTECUBITAL Performed at [place] [address], [place], [place] [address]    Special Requests   Final    BOTTLES DRAWN AEROBIC AND ANAEROBIC Blood Culture adequate volume Performed at [place] [address], [place], [place] [address]    Culture   Final    NO GROWTH 4 DAYS Performed at [facility], [place] [address] [place], [place] [address]    Report Status PENDING  Incomplete  Blood culture (routine x 2)     Status: None (Preliminary result)   Collection Time: 03/07/22  9:59 PM   Specimen: BLOOD  Result Value Ref Range Status   Specimen Description BLOOD RIGHT ANTECUBITAL  Final   Special Requests   Final    BOTTLES DRAWN AEROBIC AND ANAEROBIC Blood Culture results may not be optimal due to an inadequate volume of blood received in culture bottles   Culture   Final    NO GROWTH 4 DAYS Performed at [facility], [place] [address], [place], [place] [address]     Report Status PENDING  Incomplete  CSF culture w Gram Stain     Status: None   Collection Time: 03/07/22 11:03 PM   Specimen: CSF; Cerebrospinal Fluid  Result Value Ref Range Status   Specimen Description CSF  Final   Special Requests NONE  Final   Gram Stain NO WBC SEEN NO ORGANISMS SEEN CYTOSPIN SMEAR   Final   Culture   Final    NO GROWTH 3 DAYS Performed at [facility], [place] [address] [place], [place] [address]    Report Status 03/11/2022 FINAL  Final    Labs: CBC: Recent Labs  Lab 03/07/22 1133 03/07/22 2000 03/07/22 2156 03/08/22 0500 03/09/22 0249 03/10/22 0532 03/11/22 0515  WBC 6.1 4.2  --  4.1 5.1 4.9 5.8  NEUTROABS 0.8*  --   --  0.6* 0.8* 1.0* 0.5*  HGB 8.2* 5.9* 6.6* 9.2* 8.7* 8.2* 8.1*  HCT 25.4* 18.5* 19.5* 26.9* 25.2* 24.1* 24.4*  MCV 84.9 87.3  --  82.0 81.8  83.4 84.4  PLT 104* 62*  --  42* 83* 53* 42*   Basic Metabolic Panel: Recent Labs  Lab 03/07/22 1133 03/08/22 0500 03/09/22 0328 03/10/22 0532 03/11/22 0515  NA 137 134* 136 138 139  K 4.5 4.4 4.2 4.2 4.0  CL 107 104 107 111 111  CO2 23 22 22 $ 19* 20*  GLUCOSE 96 88 103* 90 88  BUN 15 10 9 7 7  $ CREATININE 0.66 0.71 0.75 0.64 0.66  CALCIUM 8.9 8.9 9.0 9.1 8.9  MG  --  2.1 2.0 1.9 1.9   Liver Function Tests: Recent Labs  Lab 03/07/22 1133 03/08/22 0500 03/09/22 0328 03/10/22 0532 03/11/22 0515  AST 112* 94* 85* 78* 75*  ALT 149* 130* 123* 112* 109*  ALKPHOS 88 73 80 79 91  BILITOT 1.2 2.0* 1.0 0.5 0.5  PROT 6.9 5.9* 5.9* 5.5* 5.7*  ALBUMIN 3.6 2.8* 2.7* 2.6* 2.7*   CBG: Recent Labs  Lab 03/07/22 1151  GLUCAP 93    Discharge time spent: greater than 30 minutes.  Signed: Idris Edmundson, DO [facility] 03/11/2022

## 2022-03-11 NOTE — Progress Notes (Signed)
  Transition of Care [facility]) Screening Note   Patient Details  Name: Carla Clark Date of Birth: 11-Mar-1985   Transition of Care [facility]) CM/SW Contact:    Dawayne Patricia, RN Phone Number: 03/11/2022, 10:06 AM    Transition of Care Department [facility]) has reviewed patient and no TOC needs have been identified at this time. Pt to return home w/ family, stable for transition home today. To follow up per AVS instructions.

## 2022-03-12 LAB — CULTURE, BLOOD (ROUTINE X 2)
Culture: NO GROWTH
Culture: NO GROWTH
Special Requests: ADEQUATE

## 2022-03-14 ENCOUNTER — Telehealth: Payer: Self-pay

## 2022-03-14 ENCOUNTER — Inpatient Hospital Stay: Payer: Commercial Managed Care - PPO

## 2022-03-14 VITALS — BP 114/75 | HR 85 | Resp 18

## 2022-03-14 DIAGNOSIS — Z1501 Genetic susceptibility to malignant neoplasm of breast: Secondary | ICD-10-CM | POA: Diagnosis not present

## 2022-03-14 DIAGNOSIS — C50912 Malignant neoplasm of unspecified site of left female breast: Secondary | ICD-10-CM

## 2022-03-14 DIAGNOSIS — K5090 Crohn's disease, unspecified, without complications: Secondary | ICD-10-CM | POA: Diagnosis not present

## 2022-03-14 DIAGNOSIS — C7951 Secondary malignant neoplasm of bone: Secondary | ICD-10-CM | POA: Diagnosis not present

## 2022-03-14 DIAGNOSIS — Z171 Estrogen receptor negative status [ER-]: Secondary | ICD-10-CM | POA: Diagnosis not present

## 2022-03-14 DIAGNOSIS — Z5111 Encounter for antineoplastic chemotherapy: Secondary | ICD-10-CM | POA: Diagnosis present

## 2022-03-14 DIAGNOSIS — D696 Thrombocytopenia, unspecified: Secondary | ICD-10-CM | POA: Diagnosis not present

## 2022-03-14 DIAGNOSIS — C50412 Malignant neoplasm of upper-outer quadrant of left female breast: Secondary | ICD-10-CM | POA: Diagnosis present

## 2022-03-14 DIAGNOSIS — Z79899 Other long term (current) drug therapy: Secondary | ICD-10-CM | POA: Diagnosis not present

## 2022-03-14 LAB — CMP (CANCER CENTER ONLY)
ALT: 152 U/L — ABNORMAL HIGH (ref 0–44)
AST: 111 U/L — ABNORMAL HIGH (ref 15–41)
Albumin: 3.8 g/dL (ref 3.5–5.0)
Alkaline Phosphatase: 121 U/L (ref 38–126)
Anion gap: 10 (ref 5–15)
BUN: 12 mg/dL (ref 6–20)
CO2: 21 mmol/L — ABNORMAL LOW (ref 22–32)
Calcium: 9.9 mg/dL (ref 8.9–10.3)
Chloride: 103 mmol/L (ref 98–111)
Creatinine: 0.69 mg/dL (ref 0.44–1.00)
GFR, Estimated: >60 mL/min (ref >60)
Glucose, Bld: 111 mg/dL — ABNORMAL HIGH (ref 70–99)
Potassium: 4 mmol/L (ref 3.5–5.1)
Sodium: 134 mmol/L — ABNORMAL LOW (ref 135–145)
Total Bilirubin: 0.7 mg/dL (ref 0.3–1.2)
Total Protein: 7.3 g/dL (ref 6.5–8.1)

## 2022-03-14 LAB — CBC WITH DIFFERENTIAL (CANCER CENTER ONLY)
Abs Immature Granulocytes: 0.3 10*3/uL — ABNORMAL HIGH (ref 0.00–0.07)
Basophils Absolute: 0.1 10*3/uL (ref 0.0–0.1)
Basophils Relative: 1 %
Eosinophils Absolute: 0.1 10*3/uL (ref 0.0–0.5)
Eosinophils Relative: 1 %
HCT: 28.4 % — ABNORMAL LOW (ref 36.0–46.0)
Hemoglobin: 9.1 g/dL — ABNORMAL LOW (ref 12.0–15.0)
Immature Granulocytes: 4 %
Lymphocytes Relative: 75 %
Lymphs Abs: 5.8 10*3/uL — ABNORMAL HIGH (ref 0.7–4.0)
MCH: 28 pg (ref 26.0–34.0)
MCHC: 32 g/dL (ref 30.0–36.0)
MCV: 87.4 fL (ref 80.0–100.0)
Monocytes Absolute: 0.5 10*3/uL (ref 0.1–1.0)
Monocytes Relative: 6 %
Neutro Abs: 1 10*3/uL — ABNORMAL LOW (ref 1.7–7.7)
Neutrophils Relative %: 13 %
Platelet Count: 47 10*3/uL — ABNORMAL LOW (ref 150–400)
RBC: 3.25 MIL/uL — ABNORMAL LOW (ref 3.87–5.11)
RDW: 16.2 % — ABNORMAL HIGH (ref 11.5–15.5)
Smear Review: NORMAL
WBC Count: 7.7 10*3/uL (ref 4.0–10.5)
nRBC: 12.3 % — ABNORMAL HIGH (ref 0.0–0.2)

## 2022-03-14 MED ORDER — SODIUM CHLORIDE 0.9 % IV SOLN
90.0000 mg/m2 | Freq: Once | INTRAVENOUS | Status: AC
  Administered 2022-03-14: 144 mg via INTRAVENOUS
  Filled 2022-03-14: qty 24

## 2022-03-14 MED ORDER — SODIUM CHLORIDE 0.9% FLUSH
10.0000 mL | Freq: Once | INTRAVENOUS | Status: AC
  Administered 2022-03-14: 10 mL via INTRAVENOUS

## 2022-03-14 MED ORDER — DIPHENHYDRAMINE HCL 50 MG/ML IJ SOLN
50.0000 mg | Freq: Once | INTRAMUSCULAR | Status: AC
  Administered 2022-03-14: 50 mg via INTRAVENOUS
  Filled 2022-03-14: qty 1

## 2022-03-14 MED ORDER — PALONOSETRON HCL INJECTION 0.25 MG/5ML
0.2500 mg | Freq: Once | INTRAVENOUS | Status: AC
  Administered 2022-03-14: 0.25 mg via INTRAVENOUS
  Filled 2022-03-14: qty 5

## 2022-03-14 MED ORDER — SODIUM CHLORIDE 0.9 % IV SOLN
150.0000 mg | Freq: Once | INTRAVENOUS | Status: AC
  Administered 2022-03-14: 150 mg via INTRAVENOUS
  Filled 2022-03-14: qty 150

## 2022-03-14 MED ORDER — SODIUM CHLORIDE 0.9 % IV SOLN: Freq: Once | INTRAVENOUS | Status: AC

## 2022-03-14 MED ORDER — HEPARIN SOD (PORK) LOCK FLUSH 100 UNIT/ML IV SOLN
500.0000 [IU] | Freq: Once | INTRAVENOUS | Status: AC
  Administered 2022-03-14: 500 [IU] via INTRAVENOUS

## 2022-03-14 MED ORDER — SODIUM CHLORIDE 0.9 % IV SOLN
10.0000 mg | Freq: Once | INTRAVENOUS | Status: AC
  Administered 2022-03-14: 10 mg via INTRAVENOUS
  Filled 2022-03-14: qty 10

## 2022-03-14 MED ORDER — FAMOTIDINE IN NACL 20-0.9 MG/50ML-% IV SOLN
20.0000 mg | Freq: Once | INTRAVENOUS | Status: AC
  Administered 2022-03-14: 20 mg via INTRAVENOUS
  Filled 2022-03-14: qty 50

## 2022-03-14 NOTE — Patient Instructions (Signed)
[facility]  Discharge Instructions: Thank you for choosing [facility] to provide your oncology and hematology care.   If you have a lab appointment with the [facility], please go directly to the [facility] and check in at the registration area.  Wear comfortable clothing and clothing appropriate for easy access to any Portacath or PICC line.   We strive to give you quality time with your provider. You may need to reschedule your appointment if you arrive late (15 or more minutes).  Arriving late affects you and other patients whose appointments are after yours.  Also, if you miss three or more appointments without notifying the office, you may be dismissed from the clinic at the provider's discretion.      For prescription refill requests, have your pharmacy contact our office and allow 72 hours for refills to be completed.    Today you received the following chemotherapy and/or immunotherapy agents Taxol      To help prevent nausea and vomiting after your treatment, we encourage you to take your nausea medication as directed.  BELOW ARE SYMPTOMS THAT SHOULD BE REPORTED IMMEDIATELY: *FEVER GREATER THAN 100.4 F (38 C) OR HIGHER *CHILLS OR SWEATING *NAUSEA AND VOMITING THAT IS NOT CONTROLLED WITH YOUR NAUSEA MEDICATION *UNUSUAL SHORTNESS OF BREATH *UNUSUAL BRUISING OR BLEEDING *URINARY PROBLEMS (pain or burning when urinating, or frequent urination) *BOWEL PROBLEMS (unusual diarrhea, constipation, pain near the anus) TENDERNESS IN MOUTH AND THROAT WITH OR WITHOUT PRESENCE OF ULCERS (sore throat, sores in mouth, or a toothache) UNUSUAL RASH, SWELLING OR PAIN  UNUSUAL VAGINAL DISCHARGE OR ITCHING   Items with * indicate a potential emergency and should be followed up as soon as possible or go to the Emergency Department if any problems should occur.  Please show the CHEMOTHERAPY ALERT CARD or IMMUNOTHERAPY ALERT CARD at check-in to  the Emergency Department and triage nurse. Should you have questions after your visit or need to cancel or reschedule your appointment, please contact [facility]  [phone] and follow the prompts.  Office hours are 8:00 a.m. to 4:30 p.m. Monday - Friday. Please note that voicemails left after 4:00 p.m. may not be returned until the following business day.  We are closed weekends and major holidays. You have access to a nurse at all times for urgent questions. Please call the main number to the clinic [phone] and follow the prompts.  For any non-urgent questions, you may also contact your provider using MyChart. We now offer e-Visits for anyone 53 and older to request care online for non-urgent symptoms. For details visit [email or web address].   Also download the MyChart app! Go to the app store, search "MyChart", open the app, select [facility], and log in with your MyChart username and password.

## 2022-03-14 NOTE — Transitions of Care (Post Inpatient/ED Visit) (Signed)
   03/14/2022  Name: Carla Clark MRN: [id number] DOB: 1985/03/09  Today's TOC FU Call Status: Today's TOC FU Call Status:: Successful TOC FU Call Competed TOC FU Call Complete Date: 03/14/22  Transition Care Management Follow-up Telephone Call Date of Discharge: 03/11/22 Discharge Facility: [facility]) Type of Discharge: Inpatient Admission Primary Inpatient Discharge Diagnosis:: altered mental status How have you been since you were released from the hospital?: Better Any questions or concerns?: No  Items Reviewed: Did you receive and understand the discharge instructions provided?: Yes Medications obtained and verified?: Yes (Medications Reviewed) Any new allergies since your discharge?: No Dietary orders reviewed?: NA Do you have support at home?: Yes People in Home: parent(s)  Home Care and Equipment/Supplies: [place] Ordered?: NA Any new equipment or medical supplies ordered?: NA  Functional Questionnaire: Do you need assistance with bathing/showering or dressing?: No Do you need assistance with meal preparation?: No Do you need assistance with eating?: No Do you have difficulty maintaining continence: No Do you need assistance with getting out of bed/getting out of a chair/moving?: No Do you have difficulty managing or taking your medications?: No  Folllow up appointments reviewed: PCP Follow-up appointment confirmed?: Yes Date of PCP follow-up appointment?: 03/22/22 Follow-up Provider: [facility] Follow-up appointment confirmed?: NA Do you need transportation to your follow-up appointment?: No Do you understand care options if your condition(s) worsen?: Yes-patient verbalized understanding    [place], [place] Direct Dial [phone]

## 2022-03-14 NOTE — Patient Instructions (Signed)

## 2022-03-14 NOTE — Progress Notes (Signed)
Reviewed patient labs with Dr. Marin Olp and pt ok to treat with platelets 47 and ALT 152 and AST 111. Per Dr. Marin Olp ok to proceed without final CBC results. Per Dr. Marin Olp ok to treat with ANC 1.0

## 2022-03-15 ENCOUNTER — Ambulatory Visit (INDEPENDENT_AMBULATORY_CARE_PROVIDER_SITE_OTHER): Payer: Commercial Managed Care - PPO | Admitting: Psychologist

## 2022-03-15 ENCOUNTER — Encounter: Payer: Self-pay | Admitting: *Deleted

## 2022-03-15 DIAGNOSIS — Z634 Disappearance and death of family member: Secondary | ICD-10-CM | POA: Diagnosis not present

## 2022-03-15 DIAGNOSIS — F320 Major depressive disorder, single episode, mild: Secondary | ICD-10-CM | POA: Diagnosis not present

## 2022-03-15 LAB — PATHOLOGIST SMEAR REVIEW

## 2022-03-15 NOTE — Progress Notes (Signed)
[facility] Counselor/Therapist Progress Note  Patient ID: Carla Clark, MRN: [id number],    Date: 03/15/2022  Time Spent: 12:02 pm to 12:42 pm; total time: 40 minutes   This session was held via video webex teletherapy due to the coronavirus risk at this time. The patient consented to video teletherapy and was located at her home during this session. She is aware it is the responsibility of the patient to secure confidentiality on her end of the session. The provider was in a private home office for the duration of this session. Limits of confidentiality were discussed with the patient.   Treatment Type: Individual Therapy  Reported Symptoms: Experiencing distress due to cancer  Mental Status Exam: Appearance:  Well Groomed     Behavior: Appropriate  Motor: Normal  Speech/Language:  Clear and Coherent  Affect: Appropriate  Mood: normal  Thought process: normal  Thought content:   WNL  Sensory/Perceptual disturbances:   WNL  Orientation: oriented to person, place, and time/date  Attention: Good  Concentration: Good  Memory: WNL  Fund of knowledge:  Good  Insight:   Good  Judgment:  Good  Impulse Control: Good   Risk Assessment: Danger to Self:  No Self-injurious Behavior: No Danger to Others: No Duty to Warn:no Physical Aggression / Violence:No  Access to Firearms a concern: No  Gang Involvement:No   Subjective: Beginning the session, patient described herself as having a better day. Continuing to talk, patient disclosed that since the last appointment she found out that she does not qualify for the experimental study at [facility] and started treatment at [facility]. During that time she has been admitted to the hospital at least once. She processed her thoughts and feelings. Continuing to talk, patient reflected on concerns about being responsible for others' emotional well being, being treated fragile, people not sharing information with her, and different pillars  of her life. She processed thoughts and emotions. She was agreeable to following up. She denied suicidal and homicidal ideation.    Interventions:  Worked on developing a therapeutic relationship with the patient using active listening and reflective statements. Provided emotional support using empathy and validation. Reviewed the treatment plan with the patient. Reviewed events since the last session. Processed the different thoughts and emotions that patient was experiencing. Validated some of the frustrations in the delay of starting treatment. Identified goals for the session. Normalized the thoughts expressed. Identified several different themes that the patient was experiencing. Challenged some of the thoughts expressed by the patient. Assisted the patient in problem solving. Assisted the patient in identifying who can be responsible for responding to people. Explored whether or not the patient wanted to be involved in a prayer list at work. Identified with the theme of pillars or different parts of identity. Explored what this looked like to the patient. Processed patient's experience. Provided empathic statements. Assessed for suicidal and homicidal ideation.   Homework: Identify someone who can be responsible for responding to others, reflect on different pillars of self, Explored ways to communicate information  Next Session: Emotional support   Diagnosis: F32.0 major depressive affective disorder, single episode, mild and [id number] uncomplicated bereavement.   Plan:  Goals Alleviate depressive symptoms Recognize, accept, and cope with depressive feelings Develop healthy thinking patterns Develop healthy interpersonal relationships Begin a healthy grieving process  Objectives target date for all objectives is 07/01/2022 Cooperate with a medication evaluation by a physician Verbalize an accurate understanding of depression Verbalize an understanding of the  treatment Identify and replace  thoughts that support depression Learn and implement behavioral strategies Verbalize an understanding and resolution of current interpersonal problems Learn and implement problem solving and decision making skills Learn and implement conflict resolution skills to resolve interpersonal problems Verbalize an understanding of healthy and unhealthy emotions verbalize insight into how past relationships may be influence current experiences with depression Use mindfulness and acceptance strategies and increase value based behavior  Increase hopeful statements about the future.  Tell in detail the story of the current loss that is triggering symptoms Read books on the topic of grief Watch videos on the theme of grief Begin verbalizing feelings associated with the loss Attend a grief support group express thoughts and feelings about the deceased Identify and voice positives about the deceased implement acts of spiritual faith  Interventions Consistent with treatment model, discuss how change in cognitive, behavioral, and interpersonal can help client alleviate depression CBT Behavioral activation help the client explore the relationship, nature of the dispute,  Help the client develop new interpersonal skills and relationships Conduct Problem so living therapy Teach conflict resolution skills Use a process-experiential approach Conduct TLDP Conduct ACT Evaluate need for psychotropic medication Monitor adherence to medication  create a safe environment and actively build trust use empathy, compassion, and support ask the patient to write a letter to the lost person conduct empty chair ask the patient to discuss and list the positives and negative aspects of the person encourage patient to rely upon his/her spiritual faith  ask client to read books on grief ask patient to watch videos about grief assist patient in identifying emotions  ask patient to attend support group   The patient  and clinician reviewed the treatment plan on 07/23/2021. The patient approved of the treatment plan.     Conception Chancy, PsyD

## 2022-03-15 NOTE — Progress Notes (Signed)
Patient treated in the office. She was at baseline mental status and physical performance. She will return to the office routinely for lab and toxicity check.   Oncology Nurse Navigator Documentation     03/15/2022    8:00 AM  Oncology Nurse Navigator Flowsheets  Navigator Follow Up Date: 03/21/2022  Navigator Follow Up Reason: Follow-up Appointment;Chemotherapy  Navigator Location [facility]  Navigator Encounter Type Appt/Treatment Plan Review  Patient Visit Type MedOnc  Treatment Phase Active Tx  Barriers/Navigation Needs Coordination of Care;Education  Interventions None Required  Acuity Level 2-Minimal Needs (1-2 Barriers Identified)  Support Groups/Services Friends and Family  Time Spent with Patient 15

## 2022-03-16 ENCOUNTER — Inpatient Hospital Stay: Payer: Commercial Managed Care - PPO

## 2022-03-16 ENCOUNTER — Other Ambulatory Visit: Payer: Self-pay | Admitting: *Deleted

## 2022-03-16 ENCOUNTER — Other Ambulatory Visit: Payer: Self-pay

## 2022-03-16 VITALS — BP 114/78 | HR 77 | Temp 98.4°F | Resp 17

## 2022-03-16 DIAGNOSIS — C50912 Malignant neoplasm of unspecified site of left female breast: Secondary | ICD-10-CM

## 2022-03-16 DIAGNOSIS — Z5111 Encounter for antineoplastic chemotherapy: Secondary | ICD-10-CM | POA: Diagnosis not present

## 2022-03-16 DIAGNOSIS — D508 Other iron deficiency anemias: Secondary | ICD-10-CM

## 2022-03-16 DIAGNOSIS — N63 Unspecified lump in unspecified breast: Secondary | ICD-10-CM

## 2022-03-16 LAB — COMPREHENSIVE METABOLIC PANEL
ALT: 135 U/L — ABNORMAL HIGH (ref 0–44)
AST: 74 U/L — ABNORMAL HIGH (ref 15–41)
Albumin: 3.8 g/dL (ref 3.5–5.0)
Alkaline Phosphatase: 132 U/L — ABNORMAL HIGH (ref 38–126)
Anion gap: 9 (ref 5–15)
BUN: 15 mg/dL (ref 6–20)
CO2: 21 mmol/L — ABNORMAL LOW (ref 22–32)
Calcium: 9.2 mg/dL (ref 8.9–10.3)
Chloride: 107 mmol/L (ref 98–111)
Creatinine, Ser: 0.62 mg/dL (ref 0.44–1.00)
GFR, Estimated: >60 mL/min (ref >60)
Glucose, Bld: 91 mg/dL (ref 70–99)
Potassium: 3.8 mmol/L (ref 3.5–5.1)
Sodium: 137 mmol/L (ref 135–145)
Total Bilirubin: 0.6 mg/dL (ref 0.3–1.2)
Total Protein: 6.9 g/dL (ref 6.5–8.1)

## 2022-03-16 LAB — CBC WITH DIFFERENTIAL (CANCER CENTER ONLY)
Abs Immature Granulocytes: 0.07 10*3/uL (ref 0.00–0.07)
Basophils Absolute: 0 10*3/uL (ref 0.0–0.1)
Basophils Relative: 0 %
Eosinophils Absolute: 0 10*3/uL (ref 0.0–0.5)
Eosinophils Relative: 0 %
HCT: 26.5 % — ABNORMAL LOW (ref 36.0–46.0)
Hemoglobin: 8.4 g/dL — ABNORMAL LOW (ref 12.0–15.0)
Immature Granulocytes: 1 %
Lymphocytes Relative: 59 %
Lymphs Abs: 3.5 10*3/uL (ref 0.7–4.0)
MCH: 27.6 pg (ref 26.0–34.0)
MCHC: 31.7 g/dL (ref 30.0–36.0)
MCV: 87.2 fL (ref 80.0–100.0)
Monocytes Absolute: 0.2 10*3/uL (ref 0.1–1.0)
Monocytes Relative: 3 %
Neutro Abs: 2.2 10*3/uL (ref 1.7–7.7)
Neutrophils Relative %: 37 %
Platelet Count: 45 10*3/uL — ABNORMAL LOW (ref 150–400)
RBC: 3.04 MIL/uL — ABNORMAL LOW (ref 3.87–5.11)
RDW: 16.6 % — ABNORMAL HIGH (ref 11.5–15.5)
Smear Review: NORMAL
WBC Count: 6 10*3/uL (ref 4.0–10.5)
nRBC: 4.4 % — ABNORMAL HIGH (ref 0.0–0.2)

## 2022-03-16 LAB — SAMPLE TO BLOOD BANK

## 2022-03-16 LAB — MAGNESIUM: Magnesium: 2.1 mg/dL (ref 1.7–2.4)

## 2022-03-16 LAB — FERRITIN: Ferritin: 670 ng/mL — ABNORMAL HIGH (ref 11–307)

## 2022-03-16 LAB — PREPARE RBC (CROSSMATCH)

## 2022-03-16 MED ORDER — HEPARIN SOD (PORK) LOCK FLUSH 100 UNIT/ML IV SOLN
500.0000 [IU] | Freq: Once | INTRAVENOUS | Status: AC
  Administered 2022-03-16: 500 [IU] via INTRAVENOUS

## 2022-03-16 MED ORDER — SODIUM CHLORIDE 0.9% FLUSH
10.0000 mL | Freq: Once | INTRAVENOUS | Status: AC
  Administered 2022-03-16: 10 mL via INTRAVENOUS

## 2022-03-16 NOTE — Patient Instructions (Signed)

## 2022-03-17 ENCOUNTER — Inpatient Hospital Stay: Payer: Commercial Managed Care - PPO

## 2022-03-17 ENCOUNTER — Ambulatory Visit: Payer: Commercial Managed Care - PPO | Admitting: Dietician

## 2022-03-17 ENCOUNTER — Other Ambulatory Visit: Payer: Commercial Managed Care - PPO

## 2022-03-17 ENCOUNTER — Ambulatory Visit: Payer: Commercial Managed Care - PPO

## 2022-03-17 VITALS — BP 110/75 | HR 90 | Temp 97.9°F | Resp 15

## 2022-03-17 DIAGNOSIS — C50912 Malignant neoplasm of unspecified site of left female breast: Secondary | ICD-10-CM

## 2022-03-17 DIAGNOSIS — D508 Other iron deficiency anemias: Secondary | ICD-10-CM

## 2022-03-17 DIAGNOSIS — Z5111 Encounter for antineoplastic chemotherapy: Secondary | ICD-10-CM | POA: Diagnosis not present

## 2022-03-17 DIAGNOSIS — N63 Unspecified lump in unspecified breast: Secondary | ICD-10-CM

## 2022-03-17 LAB — IRON AND IRON BINDING CAPACITY (CC-WL,HP ONLY)
Iron: 279 ug/dL — ABNORMAL HIGH (ref 28–170)
Saturation Ratios: 93 % — ABNORMAL HIGH (ref 10.4–31.8)
TIBC: 301 ug/dL (ref 250–450)
UIBC: 22 ug/dL — ABNORMAL LOW (ref 148–442)

## 2022-03-17 MED ORDER — ACETAMINOPHEN 325 MG PO TABS
650.0000 mg | ORAL_TABLET | Freq: Once | ORAL | Status: AC
  Administered 2022-03-17: 650 mg via ORAL
  Filled 2022-03-17: qty 2

## 2022-03-17 MED ORDER — DIPHENHYDRAMINE HCL 25 MG PO CAPS
25.0000 mg | ORAL_CAPSULE | Freq: Once | ORAL | Status: AC
  Administered 2022-03-17: 25 mg via ORAL
  Filled 2022-03-17: qty 1

## 2022-03-17 MED ORDER — HEPARIN SOD (PORK) LOCK FLUSH 100 UNIT/ML IV SOLN
500.0000 [IU] | Freq: Every day | INTRAVENOUS | Status: AC | PRN
  Administered 2022-03-17: 500 [IU]

## 2022-03-17 MED ORDER — DENOSUMAB 120 MG/1.7ML ~~LOC~~ SOLN
120.0000 mg | Freq: Once | SUBCUTANEOUS | Status: AC
  Administered 2022-03-17: 120 mg via SUBCUTANEOUS
  Filled 2022-03-17: qty 1.7

## 2022-03-17 MED ORDER — SODIUM CHLORIDE 0.9% FLUSH
10.0000 mL | INTRAVENOUS | Status: AC | PRN
  Administered 2022-03-17: 10 mL

## 2022-03-17 NOTE — Patient Instructions (Signed)
Blood Transfusion, Adult, Care After The following information offers guidance on how to care for yourself after your procedure. Your health care provider may also give you more specific instructions. If you have problems or questions, contact your health care provider. What can I expect after the procedure? After the procedure, it is common to have: Bruising and soreness where the IV was inserted. A headache. Follow these instructions at home: IV insertion site care     Follow instructions from your health care provider about how to take care of your IV insertion site. Make sure you: Wash your hands with soap and water for at least 20 seconds before and after you change your bandage (dressing). If soap and water are not available, use hand sanitizer. Change your dressing as told by your health care provider. Check your IV insertion site every day for signs of infection. Check for: Redness, swelling, or pain. Bleeding from the site. Warmth. Pus or a bad smell. General instructions Take over-the-counter and prescription medicines only as told by your health care provider. Rest as told by your health care provider. Return to your normal activities as told by your health care provider. Keep all follow-up visits. Lab tests may need to be done at certain periods to recheck your blood counts. Contact a health care provider if: You have itching or red, swollen areas of skin (hives). You have a fever or chills. You have pain in the head, back, or chest. You feel anxious or you feel weak after doing your normal activities. You have redness, swelling, warmth, or pain around the IV insertion site. You have blood coming from the IV insertion site that does not stop with pressure. You have pus or a bad smell coming from your IV insertion site. If you received your blood transfusion in an outpatient setting, you will be told whom to contact to report any reactions. Get help right away if: You  have symptoms of a serious allergic or immune system reaction, including: Trouble breathing or shortness of breath. Swelling of the face, feeling flushed, or widespread rash. Dark urine or blood in the urine. Fast heartbeat. These symptoms may be an emergency. Get help right away. Call 911. Do not wait to see if the symptoms will go away. Do not drive yourself to the hospital. Summary Bruising and soreness around the IV insertion site are common. Check your IV insertion site every day for signs of infection. Rest as told by your health care provider. Return to your normal activities as told by your health care provider. Get help right away for symptoms of a serious allergic or immune system reaction to the blood transfusion. This information is not intended to replace advice given to you by your health care provider. Make sure you discuss any questions you have with your health care provider. Document Revised: 04/09/2021 Document Reviewed: 04/09/2021 Elsevier Patient Education  2023 Elsevier Inc.  

## 2022-03-17 NOTE — Progress Notes (Signed)
FMLA papers completed and patient aware.  She will pick up the papers in the office on Monday with her appointment.

## 2022-03-17 NOTE — Progress Notes (Signed)
Nutrition Assessment: Reached out to patient at home/cell telephone number during her infusion.  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  She is known to Dr. Antonieta Pert practice for her PMHx that includes Iron Deficiency Anemia, Vitamin B12 Deficiency, also has Crohn's, GERD, and was recently hospitalized with AMS.  Patient will be receiving Paclitaxel + Pembrolizumab.    Patient reports her recent weight loss due to anxiety of diagnosis.  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  She is lucky to have a spouse who cooks most meals, but they are a busy family with two young girls.  She reports no food allergies, but a recent intolerance to eggs (who but not as an ingredient in cooked foods or mayo) which makes her feel " just not right. "  In managing her Crohn's she avoids nuts, beans, dark leafy greens, spicy foods and tomato products (also for GERD).  Her pattern of eating has long been a grazer.  Her NIS include recent intense GERD resolving, some nausea (eggs), some anorexia r/t anxiety. Bowels regula and Crohn's well controlled on Skyrizi.   Usual pattern and food choices:  Grabs something quickly lately English muffin peanut butter, banana or applesauce  10am Goldfish  Noon: leftovers  2pm snack [place] yogurt  Dinner: (spouses cooks) Therapist, occupational, Radiation protection practitioner and veg (peppers, green beans, cucumbers)  Snack: Smoothies (protein powder strawberry/banana/ Greek yogurt), Fluids also include decaf coffee, water (doesn't include milk)  Nutrition Focused Physical Exam: unable to perform NFPE   Medications: Has antiemetics, not currently taking and vitamins or supplements   Labs: 03/08/22  Hgb 8.4   Anthropometrics:  large weight fluctuation in past r/t pregnancies (78 & 4 year olds),  recent weight loss r/t anxiety with BCA Dx  Height: 63" Weight:  03/14/22  120.7# UBW: 130# BMI: 21.27      INTERVENTION:   Relayed that nutrition services are wrap around service provided at no charge and encouraged continued communication if experiencing any nutritional impact symptoms (NIS).  Educated on importance of adequate nourishment with calorie and protein energy intake  with nutrient dense foods when possible to maintain weight/strength and QOL.    Suggested MVI supplement, reviewed nutrients of concerns lacking in usual diet.    Encouraged looking for Vit D fortified yogurt and increasing dairy or high calcium fortified foods/beverages.    Encouraged increasing beef to 3 times a week but she reports limited due to spouses high cholesterol.  Emailed Nutrition Tip sheet  for  Nutrition During Cancer Treatment and Anemia with contact information provided.  MONITORING, EVALUATION, GOAL: weight trends, nutrition impact symptoms, PO intake, labs   Next Visit: PRN at patient or provider request.  April Manson, RDN, LDN Registered Dietitian, [facility] (Usual office hours: Tuesday-Thursday) Mobile: [phone]

## 2022-03-18 LAB — TYPE AND SCREEN
ABO/RH(D): B POS
Antibody Screen: NEGATIVE
Unit division: 0

## 2022-03-18 LAB — BPAM RBC
Blood Product Expiration Date: 202403092359
ISSUE DATE / TIME: 202402220815
Unit Type and Rh: 7300

## 2022-03-21 ENCOUNTER — Inpatient Hospital Stay: Payer: Commercial Managed Care - PPO

## 2022-03-21 ENCOUNTER — Encounter: Payer: Self-pay | Admitting: Hematology & Oncology

## 2022-03-21 ENCOUNTER — Inpatient Hospital Stay (HOSPITAL_BASED_OUTPATIENT_CLINIC_OR_DEPARTMENT_OTHER): Payer: Commercial Managed Care - PPO | Admitting: Hematology & Oncology

## 2022-03-21 ENCOUNTER — Encounter: Payer: Self-pay | Admitting: *Deleted

## 2022-03-21 VITALS — BP 135/86 | HR 110 | Temp 98.6°F | Resp 16 | Ht 63.0 in | Wt 119.0 lb

## 2022-03-21 DIAGNOSIS — C50912 Malignant neoplasm of unspecified site of left female breast: Secondary | ICD-10-CM

## 2022-03-21 DIAGNOSIS — N63 Unspecified lump in unspecified breast: Secondary | ICD-10-CM

## 2022-03-21 DIAGNOSIS — Z5111 Encounter for antineoplastic chemotherapy: Secondary | ICD-10-CM | POA: Diagnosis not present

## 2022-03-21 DIAGNOSIS — D508 Other iron deficiency anemias: Secondary | ICD-10-CM

## 2022-03-21 LAB — CMP (CANCER CENTER ONLY)
ALT: 231 U/L — ABNORMAL HIGH (ref 0–44)
AST: 149 U/L — ABNORMAL HIGH (ref 15–41)
Albumin: 3.9 g/dL (ref 3.5–5.0)
Alkaline Phosphatase: 159 U/L — ABNORMAL HIGH (ref 38–126)
Anion gap: 8 (ref 5–15)
BUN: 9 mg/dL (ref 6–20)
CO2: 21 mmol/L — ABNORMAL LOW (ref 22–32)
Calcium: 8.2 mg/dL — ABNORMAL LOW (ref 8.9–10.3)
Chloride: 105 mmol/L (ref 98–111)
Creatinine: 0.59 mg/dL (ref 0.44–1.00)
GFR, Estimated: >60 mL/min (ref >60)
Glucose, Bld: 103 mg/dL — ABNORMAL HIGH (ref 70–99)
Potassium: 3.9 mmol/L (ref 3.5–5.1)
Sodium: 134 mmol/L — ABNORMAL LOW (ref 135–145)
Total Bilirubin: 0.6 mg/dL (ref 0.3–1.2)
Total Protein: 7 g/dL (ref 6.5–8.1)

## 2022-03-21 LAB — SAMPLE TO BLOOD BANK

## 2022-03-21 LAB — CBC WITH DIFFERENTIAL (CANCER CENTER ONLY)
Abs Immature Granulocytes: 0.27 10*3/uL — ABNORMAL HIGH (ref 0.00–0.07)
Basophils Absolute: 0.1 10*3/uL (ref 0.0–0.1)
Basophils Relative: 1 %
Eosinophils Absolute: 0.1 10*3/uL (ref 0.0–0.5)
Eosinophils Relative: 2 %
HCT: 28.8 % — ABNORMAL LOW (ref 36.0–46.0)
Hemoglobin: 9.2 g/dL — ABNORMAL LOW (ref 12.0–15.0)
Immature Granulocytes: 4 %
Lymphocytes Relative: 67 %
Lymphs Abs: 4.5 10*3/uL — ABNORMAL HIGH (ref 0.7–4.0)
MCH: 27.2 pg (ref 26.0–34.0)
MCHC: 31.9 g/dL (ref 30.0–36.0)
MCV: 85.2 fL (ref 80.0–100.0)
Monocytes Absolute: 0.6 10*3/uL (ref 0.1–1.0)
Monocytes Relative: 10 %
Neutro Abs: 1.1 10*3/uL — ABNORMAL LOW (ref 1.7–7.7)
Neutrophils Relative %: 16 %
Platelet Count: 81 10*3/uL — ABNORMAL LOW (ref 150–400)
RBC: 3.38 MIL/uL — ABNORMAL LOW (ref 3.87–5.11)
RDW: 17.3 % — ABNORMAL HIGH (ref 11.5–15.5)
WBC Count: 6.7 10*3/uL (ref 4.0–10.5)
nRBC: 12.6 % — ABNORMAL HIGH (ref 0.0–0.2)

## 2022-03-21 MED ORDER — HEPARIN SOD (PORK) LOCK FLUSH 100 UNIT/ML IV SOLN
500.0000 [IU] | Freq: Once | INTRAVENOUS | Status: AC
  Administered 2022-03-21: 500 [IU] via INTRAVENOUS

## 2022-03-21 MED ORDER — SODIUM CHLORIDE 0.9% FLUSH
10.0000 mL | Freq: Once | INTRAVENOUS | Status: AC
  Administered 2022-03-21: 10 mL via INTRAVENOUS

## 2022-03-21 NOTE — Addendum Note (Signed)
Addended by: Shelda Altes on: 03/21/2022 02:58 PM   Modules accepted: Orders

## 2022-03-21 NOTE — Progress Notes (Unsigned)
Hematology and Oncology Follow Up Visit  Carla Clark [id number] August 12, 1985 36 y.o. 03/21/2022   Principle Diagnosis:  Stage IV triple negative breast cancer-bone metastasis/lymph no metastasis -- BRCA (+)  Current Therapy:   Taxol/Pembrolizumab - start cycle #1 on 03/02/2022 Xgeva 120 mg sq q 3 months -- next dose 03/02/2022     Interim History:  Carla Clark is comes in with her husband.  She will start treatment today.  We really need to get treatment started on her.  It is clear that this tumor is progressing rapidly.  Her CA 27.29 oh is now up to about 7900.  6 weeks ago it was 1700.  I think more importantly is the fact that her platelet count is going down.  I suspect that she probably has marrow involvement by the appearance of her peripheral blood smear.  She is losing weight.  I will try her on some Marinol to see if this may help with the weight gain.  Her appetite is down a little bit.  She has had no problems with pain.  She has had no cough or shortness of breath.  She has had no obvious change in bowel or bladder habits.  There is been no rashes.  She has had no obvious bleeding.  She has noted that the mass in the left breast is getting larger.  There is little bit of uncomfort in the left axilla.  Clinically, I would have said that her performance status is about ECOG 1.   Medications:  Current Outpatient Medications:    lidocaine-prilocaine (EMLA) cream, Apply 1 Application topically See admin instructions. Apply a dime-size of cream to port-a-cath 1-2 hours prior to access. Cover with [place] Mule., Disp: , Rfl:    Risankizumab-rzaa (SKYRIZI [place]), Inject into the skin. 03/21/2022 Patch x 10 minutes every 8 weeks., Disp: , Rfl:    vitamin B-12 (CYANOCOBALAMIN) 100 MCG tablet, Take 100 mcg by mouth daily., Disp: , Rfl:    docusate sodium (COLACE) 100 MG capsule, Take 1 capsule (100 mg total) by mouth 2 (two) times daily. (Patient not taking: Reported on 03/21/2022), Disp:  10 capsule, Rfl: 0   hydrocortisone (ANUSOL-HC) 2.5 % rectal cream, Place 1 Application rectally 2 (two) times daily. (Patient not taking: Reported on 03/21/2022), Disp: 28 g, Rfl: 1   ondansetron (ZOFRAN) 8 MG tablet, Take 1 tablet (8 mg total) by mouth every 8 (eight) hours as needed for nausea or vomiting. (Patient not taking: Reported on 03/21/2022), Disp: 30 tablet, Rfl: 1   polyethylene glycol (MIRALAX / GLYCOLAX) 17 g packet, Take 17 g by mouth daily as needed for mild constipation. (Patient not taking: Reported on 03/21/2022), Disp: 14 each, Rfl: 0   prochlorperazine (COMPAZINE) 10 MG tablet, Take 1 tablet (10 mg total) by mouth every 6 (six) hours as needed for nausea or vomiting. (Patient not taking: Reported on 03/21/2022), Disp: 30 tablet, Rfl: 1   pyridoxine (NEURO-K-250 VITAMIN B6) 250 MG tablet, Take 2 tablets (500 mg total) by mouth daily. (Patient not taking: Reported on 03/21/2022), Disp: 60 tablet, Rfl: 6   traMADol (ULTRAM) 50 MG tablet, Take 50 mg by mouth every 6 (six) hours as needed (for pain). (Patient not taking: Reported on 03/21/2022), Disp: , Rfl:   Allergies:  Allergies  Allergen Reactions   Other Other (See Comments)    History of Crohn's disease- NO seeds, tomatoes, anything that doesn't digest well, etc.....    Past Medical History, Surgical history, Social history, and  Family History were reviewed and updated.  Review of Systems: Review of Systems  Constitutional: Negative.   HENT:  Negative.    Eyes: Negative.   Respiratory: Negative.    Cardiovascular: Negative.   Gastrointestinal: Negative.   Endocrine: Negative.   Genitourinary: Negative.    Musculoskeletal: Negative.   Skin: Negative.   Neurological: Negative.   Hematological: Negative.   Psychiatric/Behavioral: Negative.      Physical Exam:  height is '5\' 3"'$  (1.6 m) and weight is 119 lb (54 kg). Her oral temperature is 98.6 F (37 C). Her blood pressure is 135/86 and her pulse is 110 (abnormal).  Her respiration is 16 and oxygen saturation is 100%.   Wt Readings from Last 3 Encounters:  03/21/22 119 lb (54 kg)  03/14/22 120 lb 1.3 oz (54.5 kg)  03/07/22 119 lb 12.8 oz (54.3 kg)    Physical Exam Vitals reviewed.  Constitutional:      Comments: On her breast exam, right breast is unremarkable.  Left breast shows the mass in the upper outer quadrant.  This is about 7 cm in size.  It is nontender not mobile.  There is some slight fullness in the left axilla.  She does have an area of erythema where the tumor is at the surface of the skin.  This probably measures about 5-6 mm.  HENT:     Head: Normocephalic and atraumatic.  Eyes:     Pupils: Pupils are equal, round, and reactive to light.  Cardiovascular:     Rate and Rhythm: Normal rate and regular rhythm.     Heart sounds: Normal heart sounds.  Pulmonary:     Effort: Pulmonary effort is normal.     Breath sounds: Normal breath sounds.  Abdominal:     General: Bowel sounds are normal.     Palpations: Abdomen is soft.  Musculoskeletal:        General: No tenderness or deformity. Normal range of motion.     Cervical back: Normal range of motion.  Lymphadenopathy:     Cervical: No cervical adenopathy.  Skin:    General: Skin is warm and dry.     Findings: No erythema or rash.  Neurological:     Mental Status: She is alert and oriented to person, place, and time.  Psychiatric:        Behavior: Behavior normal.        Thought Content: Thought content normal.        Judgment: Judgment normal.      Lab Results  Component Value Date   WBC 6.7 03/21/2022   HGB 9.2 (L) 03/21/2022   HCT 28.8 (L) 03/21/2022   MCV 85.2 03/21/2022   PLT 81 (L) 03/21/2022     Chemistry      Component Value Date/Time   NA 134 (L) 03/21/2022 1320   K 3.9 03/21/2022 1320   CL 105 03/21/2022 1320   CO2 21 (L) 03/21/2022 1320   BUN 9 03/21/2022 1320   CREATININE 0.59 03/21/2022 1320   CREATININE 0.62 08/20/2012 1139      Component Value  Date/Time   CALCIUM 8.2 (L) 03/21/2022 1320   ALKPHOS 159 (H) 03/21/2022 1320   AST 149 (H) 03/21/2022 1320   ALT 231 (H) 03/21/2022 1320   BILITOT 0.6 03/21/2022 1320      Impression and Plan: Carla Clark is a very charming 37 year old premenopausal white female.  She has triple negative metastatic breast cancer.  She is BRCA positive.  Again,  we really need to get going with treatment.  I realize that this is going to be quite tenuous given her low platelets.  We will have to treat her through the thrombocytopenia.  She clearly needs treatment.  I think we can monitor her progress by the mass in the left breast.  We will monitor her progress by the CA 27-29.  We can monitor her progress by the CA 27.29.  I just want to be as aggressive as possible.  I know that she has Crohn's disease.  I really think that immunotherapy is going to be helpful in this situation.  We will be checking her labs weekly.  I will plan to see her back in probably 1 month or so, if not sooner if we have a problem.   Volanda Napoleon, MD 2/26/20242:18 PM

## 2022-03-21 NOTE — Progress Notes (Unsigned)
Patient will not be treated today. Immunotherapy will be held due to elevated LFTs. Will return next week for chemo.  Oncology Nurse Navigator Documentation     03/21/2022    1:30 PM  Oncology Nurse Navigator Flowsheets  Navigator Follow Up Date: 03/28/2022  Navigator Follow Up Reason: Follow-up Appointment;Chemotherapy  Navigator Location [facility]  Navigator Encounter Type Treatment;Appt/Treatment Plan Review  Patient Visit Type MedOnc  Treatment Phase Active Tx  Barriers/Navigation Needs Coordination of Care;Education  Interventions Psycho-Social Support  Acuity Level 2-Minimal Needs (1-2 Barriers Identified)  Support Groups/Services Friends and Family  Time Spent with Patient 15

## 2022-03-22 ENCOUNTER — Encounter: Payer: Self-pay | Admitting: Hematology & Oncology

## 2022-03-22 ENCOUNTER — Ambulatory Visit: Payer: Commercial Managed Care - PPO | Admitting: Family Medicine

## 2022-03-22 ENCOUNTER — Encounter: Payer: Self-pay | Admitting: Family Medicine

## 2022-03-22 ENCOUNTER — Encounter: Payer: Self-pay | Admitting: Family

## 2022-03-22 VITALS — BP 120/78 | HR 103 | Temp 97.8°F | Resp 16 | Ht 63.0 in | Wt 119.0 lb

## 2022-03-22 DIAGNOSIS — G9341 Metabolic encephalopathy: Secondary | ICD-10-CM

## 2022-03-22 DIAGNOSIS — C50912 Malignant neoplasm of unspecified site of left female breast: Secondary | ICD-10-CM | POA: Diagnosis not present

## 2022-03-22 DIAGNOSIS — D508 Other iron deficiency anemias: Secondary | ICD-10-CM | POA: Diagnosis not present

## 2022-03-22 NOTE — Progress Notes (Signed)
Subjective:   By signing my name below, I, Kellie Simmering, attest that this documentation has been prepared under the direction and in the presence of Mosie Lukes, MD., 03/22/2022.   Patient ID: Carla Clark, female    DOB: December 02, 1985, 37 y.o.   MRN: [id number]  No chief complaint on file.  HPI Patient is in today for a hospital follow-up and is accompanied by her friend [name]. She denies CP/palpitations/SOB/HA/ congestion/fever/chills/GI or GU symptoms.  Altered Mental Status Patient was admitted to the ED on 03/07/2022 due to altered mental status. She was initially seen at [facility] that night due to epistaxis and was sent home, though returned due to confusion and failure to recognize family members. Today she is alert and orientated.   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  Stage IV Breast Cancer  Patient continues to follow with oncologist Dr. Marin Olp to treat her stage IV triple negative breast cancer that has metastasized to the bone. She is BRCA (+). Her current therapy is Taxol/Pembrolizumab and Xgeva 120 mg. She has a strong support system and does not feel that she needs to manage anxiety/depression at this time.  Past Medical History:  Diagnosis Date   Anal fissure    Anemia 11/2011   iron deficiency. 01/2013 parenteral iron. Intranasal B12.  Dr Marin Olp follows.    B12 deficiency 11/2012   Breast cancer [facility])    Breast cancer metastasized to multiple sites, left ([place]) 02/02/2022   Breast mass in female 04/21/2012   Cervical cancer screening 01/27/2012   Chicken pox as a child   Crohn's disease ([place]) 2013   ileitis 10/2011. ileitis, mesenteric abscess, ? entero-enteric fistula on 08/2012 CT enterography   Dermatitis 08/22/2016   GERD (gastroesophageal reflux disease)    Hepatitis B immune 07/2012   previous vaccination.    Hives 09/2014   per Dermatologist.  Rx Zyrtec,  Benadryl.    Ileitis    Serrated polyp of colon    Small bowel obstruction ([facility])    SVD (spontaneous vaginal delivery) 08/13/2018   Vitamin D deficiency 2014    Past Surgical History:  Procedure Laterality Date   LAPAROSCOPIC RIGHT HEMI COLECTOMY  07/11/2019   WISDOM TOOTH EXTRACTION  37 yrs old    Family History  Problem Relation Age of Onset   Nephrolithiasis Father    Brain cancer Father    Other Father        anaplastic astrocytoma   Ovarian cancer Paternal Aunt        ovarian   Breast cancer Paternal Aunt    Cancer Paternal Aunt        breast (2019) and ovarian cancer (2005)   Other Paternal Uncle 44       cyst removed from pancreas   Pancreatic cancer Paternal Uncle        Stage 1   Cancer Paternal Uncle        pancreatic   Lung cancer Maternal Grandmother    Gout Maternal Grandfather    Heart attack Maternal Grandfather    Ovarian cancer Paternal Grandmother    Cancer Paternal Grandmother 83       ovarian   Pancreatic cancer Paternal Grandfather    Colon cancer Neg Hx    Stomach cancer Neg Hx    Esophageal cancer Neg Hx     Social History   Socioeconomic History   Marital status: Married  Spouse name: Not on file   Number of children: 2   Years of education: Not on file   Highest education level: Not on file  Occupational History   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 [employer]  Tobacco Use   Smoking status: Never   Smokeless tobacco: Never   Tobacco comments:    never used tobacco  Vaping Use   Vaping Use: Never used  Substance and Sexual Activity   Alcohol use: Not Currently    Alcohol/week: 0.0 standard drinks of alcohol    Comment: occasionaly   Drug use: No   Sexual activity: Yes    Partners: Male    Birth control/protection: None  Other Topics Concern   Not on file  Social History Narrative   Not on file   Social Determinants of Health   Financial Resource Strain: Medium Risk (01/13/2022)   Overall Financial Resource  Strain (CARDIA)    Difficulty of Paying Living Expenses: Somewhat hard  Food Insecurity: No Food Insecurity (01/13/2022)   Hunger Vital Sign    Worried About Running Out of Food in the Last Year: Never true    [place] in the Last Year: Never true  Transportation Needs: No Transportation Needs (01/13/2022)   PRAPARE - Hydrologist (Medical): No    Lack of Transportation (Non-Medical): No  Physical Activity: Not on file  Stress: Not on file  Social Connections: Not on file  Intimate Partner Violence: Not At Risk (01/13/2022)   Humiliation, Afraid, Rape, and Kick questionnaire    Fear of Current or Ex-Partner: No    Emotionally Abused: No    Physically Abused: No    Sexually Abused: No    Outpatient Medications Prior to Visit  Medication Sig Dispense Refill   docusate sodium (COLACE) 100 MG capsule Take 1 capsule (100 mg total) by mouth 2 (two) times daily. (Patient not taking: Reported on 03/21/2022) 10 capsule 0   hydrocortisone (ANUSOL-HC) 2.5 % rectal cream Place 1 Application rectally 2 (two) times daily. (Patient not taking: Reported on 03/21/2022) 28 g 1   lidocaine-prilocaine (EMLA) cream Apply 1 Application topically See admin instructions. Apply a dime-size of cream to port-a-cath 1-2 hours prior to access. Cover with Saran Wrap.     ondansetron (ZOFRAN) 8 MG tablet Take 1 tablet (8 mg total) by mouth every 8 (eight) hours as needed for nausea or vomiting. (Patient not taking: Reported on 03/21/2022) 30 tablet 1   polyethylene glycol (MIRALAX / GLYCOLAX) 17 g packet Take 17 g by mouth daily as needed for mild constipation. (Patient not taking: Reported on 03/21/2022) 14 each 0   prochlorperazine (COMPAZINE) 10 MG tablet Take 1 tablet (10 mg total) by mouth every 6 (six) hours as needed for nausea or vomiting. (Patient not taking: Reported on 03/21/2022) 30 tablet 1   pyridoxine (NEURO-K-250 VITAMIN B6) 250 MG tablet Take 2 tablets (500 mg total) by  mouth daily. (Patient not taking: Reported on 03/21/2022) 60 tablet 6   Risankizumab-rzaa (SKYRIZI [place]) Inject into the skin. 03/21/2022 Patch x 10 minutes every 8 weeks.     traMADol (ULTRAM) 50 MG tablet Take 50 mg by mouth every 6 (six) hours as needed (for pain). (Patient not taking: Reported on 03/21/2022)     vitamin B-12 (CYANOCOBALAMIN) 100 MCG tablet Take 100 mcg by mouth daily.     No facility-administered medications prior to visit.    Allergies  Allergen Reactions  Other Other (See Comments)    History of Crohn's disease- NO seeds, tomatoes, anything that doesn't digest well, etc.....    Review of Systems  Constitutional:  Negative for chills and fever.  HENT:  Negative for congestion.   Respiratory:  Negative for shortness of breath.   Cardiovascular:  Negative for chest pain and palpitations.  Gastrointestinal:  Negative for abdominal pain, blood in stool, constipation, diarrhea, nausea and vomiting.  Genitourinary:  Negative for dysuria, frequency, hematuria and urgency.  Skin:           Neurological:  Negative for headaches.       Objective:    Physical Exam Constitutional:      General: She is not in acute distress.    Appearance: Normal appearance. She is normal weight. She is not ill-appearing.  HENT:     Head: Normocephalic and atraumatic.     Right Ear: External ear normal.     Left Ear: External ear normal.     Nose: Nose normal.     Mouth/Throat:     Mouth: Mucous membranes are moist.     Pharynx: Oropharynx is clear.  Eyes:     General:        Right eye: No discharge.        Left eye: No discharge.     Extraocular Movements: Extraocular movements intact.     Conjunctiva/sclera: Conjunctivae normal.     Pupils: Pupils are equal, round, and reactive to light.  Cardiovascular:     Rate and Rhythm: Normal rate and regular rhythm.     Pulses: Normal pulses.     Heart sounds: Normal heart sounds. No murmur heard.    No gallop.  Pulmonary:      Effort: Pulmonary effort is normal. No respiratory distress.     Breath sounds: Normal breath sounds. No wheezing or rales.  Abdominal:     General: Bowel sounds are normal.     Palpations: Abdomen is soft.     Tenderness: There is no abdominal tenderness. There is no guarding.  Musculoskeletal:        General: Normal range of motion.     Cervical back: Normal range of motion.     Right lower leg: No edema.     Left lower leg: No edema.  Skin:    General: Skin is warm and dry.  Neurological:     Mental Status: She is alert and oriented to person, place, and time.  Psychiatric:        Mood and Affect: Mood normal.        Behavior: Behavior normal.        Judgment: Judgment normal.     There were no vitals taken for this visit. Wt Readings from Last 3 Encounters:  03/21/22 119 lb (54 kg)  03/14/22 120 lb 1.3 oz (54.5 kg)  03/07/22 119 lb 12.8 oz (54.3 kg)    Diabetic Foot Exam - Simple   No data filed    Lab Results  Component Value Date   WBC 6.7 03/21/2022   HGB 9.2 (L) 03/21/2022   HCT 28.8 (L) 03/21/2022   PLT 81 (L) 03/21/2022   GLUCOSE 103 (H) 03/21/2022   CHOL 133 05/04/2021   TRIG 139.0 05/04/2021   HDL 53.70 05/04/2021   LDLCALC 52 05/04/2021   ALT 231 (H) 03/21/2022   AST 149 (H) 03/21/2022   NA 134 (L) 03/21/2022   K 3.9 03/21/2022   CL 105 03/21/2022  CREATININE 0.59 03/21/2022   BUN 9 03/21/2022   CO2 21 (L) 03/21/2022   TSH 4.077 03/03/2022   INR 1.2 03/08/2022   HGBA1C 5.2 09/16/2019    Lab Results  Component Value Date   TSH 4.077 03/03/2022   Lab Results  Component Value Date   WBC 6.7 03/21/2022   HGB 9.2 (L) 03/21/2022   HCT 28.8 (L) 03/21/2022   MCV 85.2 03/21/2022   PLT 81 (L) 03/21/2022   Lab Results  Component Value Date   NA 134 (L) 03/21/2022   K 3.9 03/21/2022   CO2 21 (L) 03/21/2022   GLUCOSE 103 (H) 03/21/2022   BUN 9 03/21/2022   CREATININE 0.59 03/21/2022   BILITOT 0.6 03/21/2022   ALKPHOS 159 (H) 03/21/2022    AST 149 (H) 03/21/2022   ALT 231 (H) 03/21/2022   PROT 7.0 03/21/2022   ALBUMIN 3.9 03/21/2022   CALCIUM 8.2 (L) 03/21/2022   ANIONGAP 8 03/21/2022   GFR 106.18 04/02/2021   Lab Results  Component Value Date   CHOL 133 05/04/2021   Lab Results  Component Value Date   HDL 53.70 05/04/2021   Lab Results  Component Value Date   LDLCALC 52 05/04/2021   Lab Results  Component Value Date   TRIG 139.0 05/04/2021   Lab Results  Component Value Date   CHOLHDL 2 05/04/2021   Lab Results  Component Value Date   HGBA1C 5.2 09/16/2019      Assessment & Plan:  Labs: Vitamin D to be checked.  Stage IV Breast Cancer: Counseled patient about dealing with cancer and encouraged multivitamins and protein-rich diet. Problem List Items Addressed This Visit   None  No orders of the defined types were placed in this encounter.  I, Kellie Simmering, personally preformed the services described in this documentation.  All medical record entries made by the scribe were at my direction and in my presence.  I have reviewed the chart and discharge instructions (if applicable) and agree that the record reflects my personal performance and is accurate and complete. 03/22/2022  I,Mohammed Iqbal,acting as a scribe for Penni Homans, MD.,have documented all relevant documentation on the behalf of Penni Homans, MD,as directed by  Penni Homans, MD while in the presence of Penni Homans, MD.  Kellie Simmering

## 2022-03-22 NOTE — Patient Instructions (Addendum)
[place] vitamins, EVE multivitamin Penni Homans, Pure   Vitamin D3 2000 IU daily

## 2022-03-23 NOTE — Assessment & Plan Note (Signed)
She has been undergoing treatment with chemo and steroids and she had sudden change in mental status and decompensated was hospitalized she is doing much better since returning home.

## 2022-03-23 NOTE — Assessment & Plan Note (Signed)
She is actively in treatment and has generally tolerated treatment although they did have to hold treatment this week after a hospitalization for MS changes. No specific diagnosis was every made but she is doing much better today and is accompanied by her friend.

## 2022-03-23 NOTE — Assessment & Plan Note (Signed)
Is being treated by Oncology

## 2022-03-24 ENCOUNTER — Inpatient Hospital Stay: Payer: Commercial Managed Care - PPO

## 2022-03-24 ENCOUNTER — Ambulatory Visit: Payer: Commercial Managed Care - PPO | Admitting: Hematology & Oncology

## 2022-03-24 ENCOUNTER — Ambulatory Visit: Payer: Commercial Managed Care - PPO

## 2022-03-24 ENCOUNTER — Other Ambulatory Visit: Payer: Commercial Managed Care - PPO

## 2022-03-28 ENCOUNTER — Inpatient Hospital Stay: Payer: Commercial Managed Care - PPO

## 2022-03-28 ENCOUNTER — Inpatient Hospital Stay: Payer: Commercial Managed Care - PPO | Attending: Family

## 2022-03-28 ENCOUNTER — Inpatient Hospital Stay (HOSPITAL_BASED_OUTPATIENT_CLINIC_OR_DEPARTMENT_OTHER): Payer: Commercial Managed Care - PPO | Admitting: Family

## 2022-03-28 ENCOUNTER — Encounter: Payer: Self-pay | Admitting: Family

## 2022-03-28 ENCOUNTER — Telehealth: Payer: Self-pay | Admitting: *Deleted

## 2022-03-28 VITALS — BP 105/69 | HR 74 | Temp 98.1°F | Resp 18

## 2022-03-28 VITALS — BP 128/74 | HR 97 | Temp 97.9°F | Resp 18 | Wt 120.0 lb

## 2022-03-28 DIAGNOSIS — D500 Iron deficiency anemia secondary to blood loss (chronic): Secondary | ICD-10-CM

## 2022-03-28 DIAGNOSIS — R5383 Other fatigue: Secondary | ICD-10-CM | POA: Diagnosis not present

## 2022-03-28 DIAGNOSIS — C7951 Secondary malignant neoplasm of bone: Secondary | ICD-10-CM | POA: Diagnosis not present

## 2022-03-28 DIAGNOSIS — B3731 Acute candidiasis of vulva and vagina: Secondary | ICD-10-CM

## 2022-03-28 DIAGNOSIS — Z5189 Encounter for other specified aftercare: Secondary | ICD-10-CM | POA: Insufficient documentation

## 2022-03-28 DIAGNOSIS — R7989 Other specified abnormal findings of blood chemistry: Secondary | ICD-10-CM

## 2022-03-28 DIAGNOSIS — C50912 Malignant neoplasm of unspecified site of left female breast: Secondary | ICD-10-CM

## 2022-03-28 DIAGNOSIS — C50412 Malignant neoplasm of upper-outer quadrant of left female breast: Secondary | ICD-10-CM | POA: Diagnosis present

## 2022-03-28 DIAGNOSIS — J0110 Acute frontal sinusitis, unspecified: Secondary | ICD-10-CM

## 2022-03-28 DIAGNOSIS — Z79899 Other long term (current) drug therapy: Secondary | ICD-10-CM | POA: Insufficient documentation

## 2022-03-28 DIAGNOSIS — D649 Anemia, unspecified: Secondary | ICD-10-CM | POA: Diagnosis not present

## 2022-03-28 DIAGNOSIS — Z5111 Encounter for antineoplastic chemotherapy: Secondary | ICD-10-CM | POA: Insufficient documentation

## 2022-03-28 LAB — CBC WITH DIFFERENTIAL (CANCER CENTER ONLY)
Abs Immature Granulocytes: 2.3 10*3/uL — ABNORMAL HIGH (ref 0.00–0.07)
Band Neutrophils: 7 %
Basophils Absolute: 0.2 10*3/uL — ABNORMAL HIGH (ref 0.0–0.1)
Basophils Relative: 1 %
Eosinophils Absolute: 0.3 10*3/uL (ref 0.0–0.5)
Eosinophils Relative: 2 %
HCT: 27.8 % — ABNORMAL LOW (ref 36.0–46.0)
Hemoglobin: 8.8 g/dL — ABNORMAL LOW (ref 12.0–15.0)
Lymphocytes Relative: 37 %
Lymphs Abs: 5.7 10*3/uL — ABNORMAL HIGH (ref 0.7–4.0)
MCH: 27.8 pg (ref 26.0–34.0)
MCHC: 31.7 g/dL (ref 30.0–36.0)
MCV: 87.7 fL (ref 80.0–100.0)
Metamyelocytes Relative: 10 %
Monocytes Absolute: 0.9 10*3/uL (ref 0.1–1.0)
Monocytes Relative: 6 %
Myelocytes: 5 %
Neutro Abs: 6 10*3/uL (ref 1.7–7.7)
Neutrophils Relative %: 32 %
Platelet Count: 119 10*3/uL — ABNORMAL LOW (ref 150–400)
RBC: 3.17 MIL/uL — ABNORMAL LOW (ref 3.87–5.11)
RDW: 19.8 % — ABNORMAL HIGH (ref 11.5–15.5)
Smear Review: NORMAL
WBC Count: 15.3 10*3/uL — ABNORMAL HIGH (ref 4.0–10.5)
nRBC: 13 % — ABNORMAL HIGH (ref 0.0–0.2)

## 2022-03-28 LAB — CMP (CANCER CENTER ONLY)
ALT: 234 U/L — ABNORMAL HIGH (ref 0–44)
AST: 179 U/L (ref 15–41)
Albumin: 3.6 g/dL (ref 3.5–5.0)
Alkaline Phosphatase: 331 U/L — ABNORMAL HIGH (ref 38–126)
Anion gap: 8 (ref 5–15)
BUN: 6 mg/dL (ref 6–20)
CO2: 21 mmol/L — ABNORMAL LOW (ref 22–32)
Calcium: 8.8 mg/dL — ABNORMAL LOW (ref 8.9–10.3)
Chloride: 109 mmol/L (ref 98–111)
Creatinine: 0.53 mg/dL (ref 0.44–1.00)
GFR, Estimated: >60 mL/min (ref >60)
Glucose, Bld: 107 mg/dL — ABNORMAL HIGH (ref 70–99)
Potassium: 3.9 mmol/L (ref 3.5–5.1)
Sodium: 138 mmol/L (ref 135–145)
Total Bilirubin: 0.9 mg/dL (ref 0.3–1.2)
Total Protein: 6.9 g/dL (ref 6.5–8.1)

## 2022-03-28 LAB — IRON AND IRON BINDING CAPACITY (CC-WL,HP ONLY)
Iron: 244 ug/dL — ABNORMAL HIGH (ref 28–170)
Saturation Ratios: 97 % — ABNORMAL HIGH (ref 10.4–31.8)
TIBC: 251 ug/dL (ref 250–450)
UIBC: 7 ug/dL — ABNORMAL LOW (ref 148–442)

## 2022-03-28 LAB — SAVE SMEAR(SSMR), FOR PROVIDER SLIDE REVIEW

## 2022-03-28 LAB — FERRITIN: Ferritin: 1147 ng/mL — ABNORMAL HIGH (ref 11–307)

## 2022-03-28 LAB — LACTATE DEHYDROGENASE: LDH: 634 U/L — ABNORMAL HIGH (ref 98–192)

## 2022-03-28 MED ORDER — PREDNISONE 10 MG PO TABS: 80.0000 mg | ORAL_TABLET | Freq: Every day | ORAL | 1 refills | Status: DC

## 2022-03-28 MED ORDER — FAMOTIDINE IN NACL 20-0.9 MG/50ML-% IV SOLN
20.0000 mg | Freq: Once | INTRAVENOUS | Status: AC
  Administered 2022-03-28: 20 mg via INTRAVENOUS
  Filled 2022-03-28: qty 50

## 2022-03-28 MED ORDER — DIPHENHYDRAMINE HCL 50 MG/ML IJ SOLN
50.0000 mg | Freq: Once | INTRAMUSCULAR | Status: AC
  Administered 2022-03-28: 50 mg via INTRAVENOUS
  Filled 2022-03-28: qty 1

## 2022-03-28 MED ORDER — PALONOSETRON HCL INJECTION 0.25 MG/5ML
0.2500 mg | Freq: Once | INTRAVENOUS | Status: AC
  Administered 2022-03-28: 0.25 mg via INTRAVENOUS
  Filled 2022-03-28: qty 5

## 2022-03-28 MED ORDER — SODIUM CHLORIDE 0.9 % IV SOLN
10.0000 mg | Freq: Once | INTRAVENOUS | Status: AC
  Administered 2022-03-28: 10 mg via INTRAVENOUS
  Filled 2022-03-28: qty 10

## 2022-03-28 MED ORDER — PANTOPRAZOLE SODIUM 40 MG PO TBEC: 40.0000 mg | DELAYED_RELEASE_TABLET | Freq: Two times a day (BID) | ORAL | 1 refills | Status: DC

## 2022-03-28 MED ORDER — SODIUM CHLORIDE 0.9 % IV SOLN: Freq: Once | INTRAVENOUS | Status: AC

## 2022-03-28 MED ORDER — FLUCONAZOLE 100 MG PO TABS: 100.0000 mg | ORAL_TABLET | Freq: Every day | ORAL | 1 refills | Status: DC

## 2022-03-28 MED ORDER — SODIUM CHLORIDE 0.9% FLUSH
10.0000 mL | INTRAVENOUS | Status: DC | PRN
  Administered 2022-03-28: 10 mL

## 2022-03-28 MED ORDER — HEPARIN SOD (PORK) LOCK FLUSH 100 UNIT/ML IV SOLN
500.0000 [IU] | Freq: Once | INTRAVENOUS | Status: AC | PRN
  Administered 2022-03-28: 500 [IU]

## 2022-03-28 MED ORDER — SODIUM CHLORIDE 0.9 % IV SOLN
90.0000 mg/m2 | Freq: Once | INTRAVENOUS | Status: AC
  Administered 2022-03-28: 144 mg via INTRAVENOUS
  Filled 2022-03-28: qty 24

## 2022-03-28 MED ORDER — SODIUM CHLORIDE 0.9 % IV SOLN
150.0000 mg | Freq: Once | INTRAVENOUS | Status: AC
  Administered 2022-03-28: 150 mg via INTRAVENOUS
  Filled 2022-03-28: qty 150

## 2022-03-28 MED ORDER — CEFDINIR 300 MG PO CAPS: 600.0000 mg | ORAL_CAPSULE | Freq: Every day | ORAL | 0 refills | Status: DC

## 2022-03-28 NOTE — Telephone Encounter (Signed)
Critical results received from [facility] in lab.  AST 179 and ALT 234 and ALk phos is 331.  Dr Marin Olp is aware.  No orders received

## 2022-03-28 NOTE — Progress Notes (Signed)
Okay to run paclitaxel over 60 minutes as in clinical trial Keynote 355 per Dr. Marin Olp. Patient has tolerated first 2 doses with no complications.

## 2022-03-28 NOTE — Progress Notes (Signed)
Per Dr. Marin Olp, it's OK to treat with today's elevated LFT's. Pharmacy notified.

## 2022-03-28 NOTE — Progress Notes (Signed)
Hematology and Oncology Follow Up Visit  Carla Clark:[id number] [birth year] 36 y.o. 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 She is feeling a little better energy wise but still notes fatigue.  She has mild SOB with exertion and rests when needed.  Bilateral breast exam done today. Right breast negative. Left upper outer quadrant breast mass 4-5 cm in size. No pain, rash or lesion noted exam.  No adenopathy or lymphedema noted on exam.  She has had a few nose bleeds but has had success using the [place]. No other blood loss noted.  She has had some mild bruising on legs. This has improved with increase in platelet count. No petechiae.  Her platelets today are much improved at 119, Hgb 8.8, MCV 87 and WBC count 15.3.  CA 27.29 drawn today and result is pending.  No fever, chills, n/v, cough, rash, dizziness, chest pain, palpitations, abdominal pain/bloating or changes in bowel or bladder habits at this time.  No recent .  No swelling in her extremities.  Numbness and tingling in the hands and feet comes and goes.  No falls or syncope reported.  Appetite is good but she admits that she needs to better hydrate throughout the day. Her weight is stable at 120 lbs.   ECOG Performance Status: 1 - Symptomatic but completely ambulatory  Medications:  Allergies as of 03/28/2022       Reactions   Other Other (See Comments)   History of Crohn's disease- NO seeds, tomatoes, anything that doesn't digest well, etc.....        Medication List        Accurate as of March 28, 2022  9:27 AM. If you have any  questions, ask your nurse or doctor.          cyanocobalamin 100 MCG tablet Commonly known as: VITAMIN B12 Take 100 mcg by mouth daily.   docusate sodium 100 MG capsule Commonly known as: COLACE Take 1 capsule (100 mg total) by mouth 2 (two) times daily.   hydrocortisone 2.5 % rectal cream Commonly known as: ANUSOL-HC Place 1 Application rectally 2 (two) times daily.   lidocaine-prilocaine cream Commonly known as: EMLA Apply 1 Application topically See admin instructions. Apply a dime-size of cream to port-a-cath 1-2 hours prior to access. Cover with [employer].   ondansetron 8 MG tablet Commonly known as: Zofran Take 1 tablet (8 mg total) by mouth every 8 (eight) hours as needed for nausea or vomiting.   polyethylene glycol 17 g packet Commonly known as: MIRALAX / GLYCOLAX Take 17 g by mouth daily as needed for mild constipation.   prochlorperazine 10 MG tablet Commonly known as: COMPAZINE Take 1 tablet (10 mg total) by mouth every 6 (six) hours as needed for nausea or vomiting.   pyridoxine 250 MG tablet Commonly known as: Neuro-K-250 Vitamin B6 Take 2 tablets (500 mg total) by mouth daily.   SKYRIZI [place] Inject into the skin. 03/21/2022 Patch x 10 minutes every 8 weeks.   traMADol 50 MG tablet Commonly known as: ULTRAM Take 50 mg by mouth  every 6 (six) hours as needed (for pain).        Allergies:  Allergies  Allergen Reactions   Other Other (See Comments)    History of Crohn's disease- NO seeds, tomatoes, anything that doesn't digest well, etc.....    Past Medical History, Surgical history, Social history, and Family History were reviewed and updated.  Review of Systems: All other 10 point review of systems is negative.   Physical Exam:  weight is 120 lb (54.4 kg). Her oral temperature is 97.9 F (36.6 C). Her blood pressure is 128/74 and her pulse is 97. Her respiration is 18 and oxygen saturation is 100%.   Wt Readings from Last 3 Encounters:   03/28/22 120 lb (54.4 kg)  03/22/22 119 lb (54 kg)  03/21/22 119 lb (54 kg)    Ocular: Sclerae unicteric, pupils equal, round and reactive to light Ear-nose-throat: Oropharynx clear, dentition fair Lymphatic: No cervical or supraclavicular adenopathy Lungs no rales or rhonchi, good excursion bilaterally Heart regular rate and rhythm, no murmur appreciated Abd soft, nontender, positive bowel sounds MSK no focal spinal tenderness, no joint edema Neuro: non-focal, well-oriented, appropriate affect Breasts: Right breast exam negative. Mass on the upper outs quadrant of the left breast is measuring 4-5 cm. No lesion or rash noted. No pain st the site.   Lab Results  Component Value Date   WBC 15.3 (H) 03/28/2022   HGB 8.8 (L) 03/28/2022   HCT 27.8 (L) 03/28/2022   MCV 87.7 03/28/2022   PLT 119 (L) 03/28/2022   Lab Results  Component Value Date   FERRITIN 670 (H) 03/16/2022   IRON 279 (H) 03/16/2022   TIBC 301 03/16/2022   UIBC 22 (L) 03/16/2022   IRONPCTSAT 93 (H) 03/16/2022   Lab Results  Component Value Date   RETICCTPCT 0.7 08/24/2021   RBC 3.17 (L) 03/28/2022   RETICCTABS 13.7 (L) 01/20/2015   No results found for: "KPAFRELGTCHN", "LAMBDASER", "KAPLAMBRATIO" Lab Results  Component Value Date   IGA 187 03/11/2016   No results found for: "TOTALPROTELP", "ALBUMINELP", "A1GS", "A2GS", "BETS", "BETA2SER", "GAMS", "MSPIKE", "SPEI"   Chemistry      Component Value Date/Time   NA 134 (L) 03/21/2022 1320   K 3.9 03/21/2022 1320   CL 105 03/21/2022 1320   CO2 21 (L) 03/21/2022 1320   BUN 9 03/21/2022 1320   CREATININE 0.59 03/21/2022 1320   CREATININE 0.62 08/20/2012 1139      Component Value Date/Time   CALCIUM 8.2 (L) 03/21/2022 1320   ALKPHOS 159 (H) 03/21/2022 1320   AST 149 (H) 03/21/2022 1320   ALT 231 (H) 03/21/2022 1320   BILITOT 0.6 03/21/2022 1320       Impression and Plan: Carla Clark is a very pleasant 37 yo premenopausal caucasian female with triple  negative metastatic breast cancer. She is BRCA positive.  I discussed patient exam as well as her CBC and CMP results with Carla Clark. LFT's are noted to be increased since last visit.  Per Carla Clark we will start her on high dose steroids with Prednisone 80 mg PO daily and prophylactic Diflucan 100 mg PO daily and Protonix 40 mg PO BID.  We will proceed with treatment today as planned per MD.  CA 27.29 pending.  For her sinus infection we will have her started Omnicef 600 mg PO daily for 7 days.  Iron studies are pending. We will replace if needed.  Follow-up in 1 week.   Lottie Dawson, NP 3/4/20249:27 AM

## 2022-03-28 NOTE — Patient Instructions (Signed)

## 2022-03-28 NOTE — Patient Instructions (Addendum)
[facility]  Discharge Instructions: Thank you for choosing [facility] to provide your oncology and hematology care.   If you have a lab appointment with the [facility], please go directly to the [facility] and check in at the registration area.  Wear comfortable clothing and clothing appropriate for easy access to any Portacath or PICC line.   We strive to give you quality time with your provider. You may need to reschedule your appointment if you arrive late (15 or more minutes).  Arriving late affects you and other patients whose appointments are after yours.  Also, if you miss three or more appointments without notifying the office, you may be dismissed from the clinic at the provider's discretion.      For prescription refill requests, have your pharmacy contact our office and allow 72 hours for refills to be completed.    Today you received the following chemotherapy and/or immunotherapy agents Taxol.   To help prevent nausea and vomiting after your treatment, we encourage you to take your nausea medication as directed.  BELOW ARE SYMPTOMS THAT SHOULD BE REPORTED IMMEDIATELY: *FEVER GREATER THAN 100.4 F (38 C) OR HIGHER *CHILLS OR SWEATING *NAUSEA AND VOMITING THAT IS NOT CONTROLLED WITH YOUR NAUSEA MEDICATION *UNUSUAL SHORTNESS OF BREATH *UNUSUAL BRUISING OR BLEEDING *URINARY PROBLEMS (pain or burning when urinating, or frequent urination) *BOWEL PROBLEMS (unusual diarrhea, constipation, pain near the anus) TENDERNESS IN MOUTH AND THROAT WITH OR WITHOUT PRESENCE OF ULCERS (sore throat, sores in mouth, or a toothache) UNUSUAL RASH, SWELLING OR PAIN  UNUSUAL VAGINAL DISCHARGE OR ITCHING   Items with * indicate a potential emergency and should be followed up as soon as possible or go to the Emergency Department if any problems should occur.  Please show the CHEMOTHERAPY ALERT CARD or IMMUNOTHERAPY ALERT CARD at check-in to  the Emergency Department and triage nurse. Should you have questions after your visit or need to cancel or reschedule your appointment, please contact [facility]  [phone] and follow the prompts.  Office hours are 8:00 a.m. to 4:30 p.m. Monday - Friday. Please note that voicemails left after 4:00 p.m. may not be returned until the following business day.  We are closed weekends and major holidays. You have access to a nurse at all times for urgent questions. Please call the main number to the clinic [phone] and follow the prompts.  For any non-urgent questions, you may also contact your provider using MyChart. We now offer e-Visits for anyone 3 and older to request care online for non-urgent symptoms. For details visit [email or web address].   Also download the MyChart app! Go to the app store, search "MyChart", open the app, select [facility], and log in with your MyChart username and password.

## 2022-03-29 ENCOUNTER — Ambulatory Visit (INDEPENDENT_AMBULATORY_CARE_PROVIDER_SITE_OTHER): Payer: Commercial Managed Care - PPO | Admitting: Psychologist

## 2022-03-29 ENCOUNTER — Encounter: Payer: Self-pay | Admitting: *Deleted

## 2022-03-29 DIAGNOSIS — F320 Major depressive disorder, single episode, mild: Secondary | ICD-10-CM

## 2022-03-29 DIAGNOSIS — Z634 Disappearance and death of family member: Secondary | ICD-10-CM | POA: Diagnosis not present

## 2022-03-29 LAB — CANCER ANTIGEN 27.29: CA 27.29: 6389.5 U/mL — ABNORMAL HIGH (ref 0.0–38.6)

## 2022-03-29 NOTE — Progress Notes (Signed)
[facility] Counselor/Therapist Progress Note  Patient ID: Carla Clark, MRN: [id number],    Date: 03/29/2022  Time Spent: 12:03 pm to 12:45 pm; total time: 42 minutes   This session was held via video webex teletherapy due to the coronavirus risk at this time. The patient consented to video teletherapy and was located at her home during this session. She is aware it is the responsibility of the patient to secure confidentiality on her end of the session. The provider was in a private home office for the duration of this session. Limits of confidentiality were discussed with the patient.   Treatment Type: Individual Therapy  Reported Symptoms: Experiencing some stress related to family members  Mental Status Exam: Appearance:  Well Groomed     Behavior: Appropriate  Motor: Normal  Speech/Language:  Clear and Coherent  Affect: Appropriate  Mood: normal  Thought process: normal  Thought content:   WNL  Sensory/Perceptual disturbances:   WNL  Orientation: oriented to person, place, and time/date  Attention: Good  Concentration: Good  Memory: WNL  Fund of knowledge:  Good  Insight:   Good  Judgment:  Good  Impulse Control: Good   Risk Assessment: Danger to Self:  No Self-injurious Behavior: No Danger to Others: No Duty to Warn:no Physical Aggression / Violence:No  Access to Firearms a concern: No  Gang Involvement:No   Subjective: Beginning the session, patient described herself as doing well due to having some positive news from her most recent medical appointment and being on steroids. Patient voiced that she primarily wanted to focus on some challenges she experiences with her mother. She reflected on the idea of possibly establishing some boundaries with her mother. She processed thoughts and emotions. She stated understanding regarding the transition. She was agreeable to following up. She denied suicidal and homicidal ideation.    Interventions:   Worked on developing a therapeutic relationship with the patient using active listening and reflective statements. Provided emotional support using empathy and validation. Reviewed the treatment plan with the patient. Praised the patient for doing well and receiving positive news from most recent medical appointment. Identified goals for the session. Reviewed some of the challenges that patient maintains with her mother. Used socratic questions to assist the patient. Normalized frustrations. Explored and processed the idea of establishing boundaries with mother. Assisted in problem solving. Disclosed to patient about the upcoming transition. Discussed next steps. Provided empathic statements. Assessed for suicidal and homicidal ideation.   Homework: Reflect on establishing boundaries with mother  Next Session: Emotional support   Diagnosis: F32.0 major depressive affective disorder, single episode, mild and [id number] uncomplicated bereavement.   Plan:  Goals Alleviate depressive symptoms Recognize, accept, and cope with depressive feelings Develop healthy thinking patterns Develop healthy interpersonal relationships Begin a healthy grieving process  Objectives target date for all objectives is 07/01/2022 Cooperate with a medication evaluation by a physician Verbalize an accurate understanding of depression Verbalize an understanding of the treatment Identify and replace thoughts that support depression Learn and implement behavioral strategies Verbalize an understanding and resolution of current interpersonal problems Learn and implement problem solving and decision making skills Learn and implement conflict resolution skills to resolve interpersonal problems Verbalize an understanding of healthy and unhealthy emotions verbalize insight into how past relationships may be influence current experiences with depression Use mindfulness and acceptance strategies and increase value based behavior   Increase hopeful statements about the future.  Tell in detail the story of the current  loss that is triggering symptoms Read books on the topic of grief Watch videos on the theme of grief Begin verbalizing feelings associated with the loss Attend a grief support group express thoughts and feelings about the deceased Identify and voice positives about the deceased implement acts of spiritual faith  Interventions Consistent with treatment model, discuss how change in cognitive, behavioral, and interpersonal can help client alleviate depression CBT Behavioral activation help the client explore the relationship, nature of the dispute,  Help the client develop new interpersonal skills and relationships Conduct Problem so living therapy Teach conflict resolution skills Use a process-experiential approach Conduct TLDP Conduct ACT Evaluate need for psychotropic medication Monitor adherence to medication  create a safe environment and actively build trust use empathy, compassion, and support ask the patient to write a letter to the lost person conduct empty chair ask the patient to discuss and list the positives and negative aspects of the person encourage patient to rely upon his/her spiritual faith  ask client to read books on grief ask patient to watch videos about grief assist patient in identifying emotions  ask patient to attend support group   The patient and clinician reviewed the treatment plan on 07/23/2021. The patient approved of the treatment plan.     Conception Chancy, PsyD

## 2022-03-29 NOTE — Progress Notes (Signed)
Patient received next treatment cycle. Overall doing well with treatment with minimal side effects?  Oncology Nurse Navigator Documentation     03/29/2022    8:30 AM  Oncology Nurse Navigator Flowsheets  Navigator Follow Up Date: 04/11/2022  Navigator Follow Up Reason: Follow-up Appointment;Chemotherapy  Navigator Location [facility]  Navigator Encounter Type Appt/Treatment Plan Review  Patient Visit Type MedOnc  Treatment Phase Active Tx  Barriers/Navigation Needs Coordination of Care;Education  Interventions None Required  Acuity Level 2-Minimal Needs (1-2 Barriers Identified)  Support Groups/Services Friends and Family  Time Spent with Patient 15

## 2022-04-04 ENCOUNTER — Inpatient Hospital Stay: Payer: Commercial Managed Care - PPO

## 2022-04-04 ENCOUNTER — Encounter: Payer: Self-pay | Admitting: Hematology & Oncology

## 2022-04-04 ENCOUNTER — Inpatient Hospital Stay (HOSPITAL_BASED_OUTPATIENT_CLINIC_OR_DEPARTMENT_OTHER): Payer: Commercial Managed Care - PPO | Admitting: Hematology & Oncology

## 2022-04-04 ENCOUNTER — Encounter: Payer: Self-pay | Admitting: *Deleted

## 2022-04-04 ENCOUNTER — Other Ambulatory Visit: Payer: Self-pay

## 2022-04-04 VITALS — BP 133/82 | HR 79 | Temp 97.7°F | Resp 18 | Ht 63.0 in | Wt 119.0 lb

## 2022-04-04 VITALS — BP 118/76 | HR 79 | Resp 17

## 2022-04-04 DIAGNOSIS — C50912 Malignant neoplasm of unspecified site of left female breast: Secondary | ICD-10-CM

## 2022-04-04 DIAGNOSIS — Z5111 Encounter for antineoplastic chemotherapy: Secondary | ICD-10-CM | POA: Diagnosis not present

## 2022-04-04 LAB — CMP (CANCER CENTER ONLY)
ALT: 183 U/L — ABNORMAL HIGH (ref 0–44)
AST: 80 U/L — ABNORMAL HIGH (ref 15–41)
Albumin: 3.5 g/dL (ref 3.5–5.0)
Alkaline Phosphatase: 372 U/L — ABNORMAL HIGH (ref 38–126)
Anion gap: 9 (ref 5–15)
BUN: 14 mg/dL (ref 6–20)
CO2: 18 mmol/L — ABNORMAL LOW (ref 22–32)
Calcium: 8.3 mg/dL — ABNORMAL LOW (ref 8.9–10.3)
Chloride: 110 mmol/L (ref 98–111)
Creatinine: 0.56 mg/dL (ref 0.44–1.00)
GFR, Estimated: >60 mL/min (ref >60)
Glucose, Bld: 105 mg/dL — ABNORMAL HIGH (ref 70–99)
Potassium: 4 mmol/L (ref 3.5–5.1)
Sodium: 137 mmol/L (ref 135–145)
Total Bilirubin: 0.9 mg/dL (ref 0.3–1.2)
Total Protein: 6.7 g/dL (ref 6.5–8.1)

## 2022-04-04 LAB — CBC WITH DIFFERENTIAL (CANCER CENTER ONLY)
Abs Immature Granulocytes: 0.12 10*3/uL — ABNORMAL HIGH (ref 0.00–0.07)
Basophils Absolute: 0 10*3/uL (ref 0.0–0.1)
Basophils Relative: 1 %
Eosinophils Absolute: 0 10*3/uL (ref 0.0–0.5)
Eosinophils Relative: 0 %
HCT: 27.1 % — ABNORMAL LOW (ref 36.0–46.0)
Hemoglobin: 8.6 g/dL — ABNORMAL LOW (ref 12.0–15.0)
Immature Granulocytes: 2 %
Lymphocytes Relative: 53 %
Lymphs Abs: 3.3 10*3/uL (ref 0.7–4.0)
MCH: 27.7 pg (ref 26.0–34.0)
MCHC: 31.7 g/dL (ref 30.0–36.0)
MCV: 87.4 fL (ref 80.0–100.0)
Monocytes Absolute: 0.3 10*3/uL (ref 0.1–1.0)
Monocytes Relative: 5 %
Neutro Abs: 2.4 10*3/uL (ref 1.7–7.7)
Neutrophils Relative %: 39 %
Platelet Count: 92 10*3/uL — ABNORMAL LOW (ref 150–400)
RBC: 3.1 MIL/uL — ABNORMAL LOW (ref 3.87–5.11)
RDW: 21 % — ABNORMAL HIGH (ref 11.5–15.5)
WBC Count: 6.1 10*3/uL (ref 4.0–10.5)
nRBC: 8.2 % — ABNORMAL HIGH (ref 0.0–0.2)

## 2022-04-04 LAB — LACTATE DEHYDROGENASE: LDH: 496 U/L — ABNORMAL HIGH (ref 98–192)

## 2022-04-04 LAB — SAMPLE TO BLOOD BANK

## 2022-04-04 MED ORDER — SODIUM CHLORIDE 0.9 % IV SOLN
150.0000 mg | Freq: Once | INTRAVENOUS | Status: AC
  Administered 2022-04-04: 150 mg via INTRAVENOUS
  Filled 2022-04-04: qty 150

## 2022-04-04 MED ORDER — FAMOTIDINE IN NACL 20-0.9 MG/50ML-% IV SOLN
20.0000 mg | Freq: Once | INTRAVENOUS | Status: AC
  Administered 2022-04-04: 20 mg via INTRAVENOUS
  Filled 2022-04-04: qty 50

## 2022-04-04 MED ORDER — SODIUM CHLORIDE 0.9 % IV SOLN
90.0000 mg/m2 | Freq: Once | INTRAVENOUS | Status: AC
  Administered 2022-04-04: 144 mg via INTRAVENOUS
  Filled 2022-04-04: qty 24

## 2022-04-04 MED ORDER — HEPARIN SOD (PORK) LOCK FLUSH 100 UNIT/ML IV SOLN
500.0000 [IU] | Freq: Once | INTRAVENOUS | Status: AC | PRN
  Administered 2022-04-04: 500 [IU]

## 2022-04-04 MED ORDER — SODIUM CHLORIDE 0.9 % IV SOLN
10.0000 mg | Freq: Once | INTRAVENOUS | Status: AC
  Administered 2022-04-04: 10 mg via INTRAVENOUS
  Filled 2022-04-04: qty 10

## 2022-04-04 MED ORDER — SODIUM CHLORIDE 0.9 % IV SOLN: Freq: Once | INTRAVENOUS | Status: AC

## 2022-04-04 MED ORDER — PALONOSETRON HCL INJECTION 0.25 MG/5ML
0.2500 mg | Freq: Once | INTRAVENOUS | Status: AC
  Administered 2022-04-04: 0.25 mg via INTRAVENOUS
  Filled 2022-04-04: qty 5

## 2022-04-04 MED ORDER — DIPHENHYDRAMINE HCL 50 MG/ML IJ SOLN
50.0000 mg | Freq: Once | INTRAMUSCULAR | Status: AC
  Administered 2022-04-04: 50 mg via INTRAVENOUS
  Filled 2022-04-04: qty 1

## 2022-04-04 MED ORDER — SODIUM CHLORIDE 0.9% FLUSH
10.0000 mL | INTRAVENOUS | Status: DC | PRN
  Administered 2022-04-04: 10 mL

## 2022-04-04 NOTE — Patient Instructions (Signed)
[facility]  Discharge Instructions: Thank you for choosing [facility] to provide your oncology and hematology care.   If you have a lab appointment with the Cancer Center, please go directly to the Cancer Center and check in at the registration area.  Wear comfortable clothing and clothing appropriate for easy access to any Portacath or PICC line.   We strive to give you quality time with your provider. You may need to reschedule your appointment if you arrive late (15 or more minutes).  Arriving late affects you and other patients whose appointments are after yours.  Also, if you miss three or more appointments without notifying the office, you may be dismissed from the clinic at the provider's discretion.      For prescription refill requests, have your pharmacy contact our office and allow 72 hours for refills to be completed.    Today you received the following chemotherapy and/or immunotherapy agents Taxol.   To help prevent nausea and vomiting after your treatment, we encourage you to take your nausea medication as directed.  BELOW ARE SYMPTOMS THAT SHOULD BE REPORTED IMMEDIATELY: *FEVER GREATER THAN 100.4 F (38 C) OR HIGHER *CHILLS OR SWEATING *NAUSEA AND VOMITING THAT IS NOT CONTROLLED WITH YOUR NAUSEA MEDICATION *UNUSUAL SHORTNESS OF BREATH *UNUSUAL BRUISING OR BLEEDING *URINARY PROBLEMS (pain or burning when urinating, or frequent urination) *BOWEL PROBLEMS (unusual diarrhea, constipation, pain near the anus) TENDERNESS IN MOUTH AND THROAT WITH OR WITHOUT PRESENCE OF ULCERS (sore throat, sores in mouth, or a toothache) UNUSUAL RASH, SWELLING OR PAIN  UNUSUAL VAGINAL DISCHARGE OR ITCHING   Items with * indicate a potential emergency and should be followed up as soon as possible or go to the Emergency Department if any problems should occur.  Please show the CHEMOTHERAPY ALERT CARD or IMMUNOTHERAPY ALERT CARD at check-in to  the Emergency Department and triage nurse. Should you have questions after your visit or need to cancel or reschedule your appointment, please contact [facility]  [phone] and follow the prompts.  Office hours are 8:00 a.m. to 4:30 p.m. Monday - Friday. Please note that voicemails left after 4:00 p.m. may not be returned until the following business day.  We are closed weekends and major holidays. You have access to a nurse at all times for urgent questions. Please call the main number to the clinic [phone] and follow the prompts.  For any non-urgent questions, you may also contact your provider using MyChart. We now offer e-Visits for anyone 18 and older to request care online for non-urgent symptoms. For details visit [email or web address].   Also download the MyChart app! Go to the app store, search "MyChart", open the app, select [facility], and log in with your MyChart username and password.   

## 2022-04-04 NOTE — Progress Notes (Signed)
Hematology and Oncology Follow Up Visit  Carla Clark:[id number] 1985-05-23 36 y.o. 04/04/2022   Principle Diagnosis:  Stage IV triple negative breast cancer-bone metastasis/lymph no metastasis -- BRCA (+)  Current Therapy:   Taxol/Pembrolizumab - start cycle #1 on 03/02/2022 Xgeva 120 mg sq q 3 months -- next dose 03/02/2022     Interim History:  Carla Clark is comes in with her best friend for visit.  She actually was admitted to [facility] with this neurological issue.  It was hard to say what had caused this.  Thankfully, there is no evidence of meningeal involvement.  She had no tumors on the MRI.  She all of a sudden got better.  She was quite anemic.  I do not know if this may have been a source.  She looks great today.  She has a improving platelet count.  I think this is a good indicator that treatment is working for [place].  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  The only problem that we now have is the fact that her LFTs are elevated.  I do not know if this might be from the pembrolizumab.  As such, we will not be able to give her the pembrolizumab today.  It seems like the mass in the left breast does seem to be regressing a little bit.  She is eating well.  She is having no problems with nausea or vomiting.  She is having no problems with bony pain.  There is no cough or shortness of breath.  She is having no nausea or vomiting.  There is been no change in bowel or bladder habits.  It will be very interesting to see what her next CA 27.29 will be.  She has had no fever.  Again there is no bleeding.  Overall, I would say that her performance status is probably ECOG 1.    Medications:  Current Outpatient Medications:  .  cefdinir (OMNICEF) 300 MG capsule, Take 2 capsules (600 mg total) by mouth daily., Disp: 14 capsule, Rfl: 0 .  docusate sodium (COLACE) 100 MG capsule, Take 1 capsule (100  mg total) by mouth 2 (two) times daily., Disp: 10 capsule, Rfl: 0 .  fluconazole (DIFLUCAN) 100 MG tablet, Take 1 tablet (100 mg total) by mouth daily., Disp: 30 tablet, Rfl: 1 .  hydrocortisone (ANUSOL-HC) 2.5 % rectal cream, Place 1 Application rectally 2 (two) times daily., Disp: 28 g, Rfl: 1 .  lidocaine-prilocaine (EMLA) cream, Apply 1 Application topically See admin instructions. Apply a dime-size of cream to port-a-cath 1-2 hours prior to access. Cover with [employer]., Disp: , Rfl:  .  ondansetron (ZOFRAN) 8 MG tablet, Take 1 tablet (8 mg total) by mouth every 8 (eight) hours as needed for nausea or vomiting., Disp: 30 tablet, Rfl: 1 .  pantoprazole (PROTONIX) 40 MG tablet, Take 1 tablet (40 mg total) by mouth 2 (two) times daily., Disp: 60 tablet, Rfl: 1 .  polyethylene glycol (MIRALAX / GLYCOLAX) 17 g packet, Take 17 g by mouth daily as needed for mild constipation., Disp: 14 each, Rfl: 0 .  predniSONE (DELTASONE) 10 MG tablet, Take 8 tablets (80 mg total) by mouth daily with breakfast., Disp: 120 tablet, Rfl: 1 .  prochlorperazine (COMPAZINE) 10 MG tablet, Take 1 tablet (10 mg total) by mouth every 6 (six) hours as needed for nausea or vomiting., Disp: 30 tablet, Rfl: 1 .  pyridoxine (NEURO-K-250 VITAMIN B6) 250 MG tablet, Take 2 tablets (500 mg total) by mouth daily., Disp: 60 tablet, Rfl: 6 .  Risankizumab-rzaa (SKYRIZI [place]), Inject into the skin. 03/21/2022 Patch x 10 minutes every 8 weeks., Disp: , Rfl:  .  traMADol (ULTRAM) 50 MG tablet, Take 50 mg by mouth every 6 (six) hours as needed (for pain)., Disp: , Rfl:  .  vitamin B-12 (CYANOCOBALAMIN) 100 MCG tablet, Take 100 mcg by mouth daily., Disp: , Rfl:   Allergies:  Allergies  Allergen Reactions  . Other Other (See Comments)    History of Crohn's disease- NO seeds, tomatoes, anything that doesn't digest well, etc.....    Past Medical History, Surgical history, Social history, and Family History were reviewed and updated.  Review  of Systems: Review of Systems  Constitutional: Negative.   HENT:  Negative.    Eyes: Negative.   Respiratory: Negative.    Cardiovascular: Negative.   Gastrointestinal: Negative.   Endocrine: Negative.   Genitourinary: Negative.    Musculoskeletal: Negative.   Skin: Negative.   Neurological: Negative.   Hematological: Negative.   Psychiatric/Behavioral: Negative.      Physical Exam:  height is '5\' 3"'$  (1.6 m) and weight is 119 lb (54 kg). Her oral temperature is 97.7 F (36.5 C). Her blood pressure is 133/82 and her pulse is 79. Her respiration is 18 and oxygen saturation is 100%.   Wt Readings from Last 3 Encounters:  04/04/22 119 lb (54 kg)  03/28/22 120 lb (54.4 kg)  03/22/22 119 lb (54 kg)    Physical Exam Vitals reviewed.  Constitutional:      Comments: On her breast exam, right breast is unremarkable.  Left breast shows the mass in the upper outer quadrant.  This is about 4-5 cm in size.  It is nontender not mobile.  There is some slight fullness in the left axilla.  She does have an area of erythema where the tumor is at the surface of the skin.  This probably measures about 5 mm.  HENT:     Head: Normocephalic and atraumatic.  Eyes:     Pupils: Pupils are equal, round, and reactive to light.  Cardiovascular:     Rate and Rhythm: Normal rate and regular rhythm.     Heart sounds: Normal heart sounds.  Pulmonary:     Effort: Pulmonary effort is normal.     Breath sounds: Normal breath sounds.  Abdominal:     General: Bowel sounds are normal.     Palpations: Abdomen is soft.  Musculoskeletal:        General: No tenderness or deformity. Normal range of motion.     Cervical back: Normal range of motion.  Lymphadenopathy:     Cervical: No cervical adenopathy.  Skin:    General: Skin is warm and dry.     Findings: No erythema or rash.  Neurological:     Mental Status: She is alert and oriented to person, place, and time.  Psychiatric:        Behavior: Behavior  normal.        Thought Content: Thought content normal.        Judgment: Judgment normal.     Lab Results  Component Value Date   WBC 6.1 04/04/2022   HGB 8.6 (L) 04/04/2022   HCT 27.1 (L) 04/04/2022   MCV 87.4 04/04/2022   PLT 92 (L) 04/04/2022     Chemistry      Component Value Date/Time  NA 137 04/04/2022 0915   K 4.0 04/04/2022 0915   CL 110 04/04/2022 0915   CO2 18 (L) 04/04/2022 0915   BUN 14 04/04/2022 0915   CREATININE 0.56 04/04/2022 0915   CREATININE 0.62 08/20/2012 1139      Component Value Date/Time   CALCIUM 8.3 (L) 04/04/2022 0915   ALKPHOS 372 (H) 04/04/2022 0915   AST 80 (H) 04/04/2022 0915   ALT 183 (H) 04/04/2022 0915   BILITOT 0.9 04/04/2022 0915      Impression and Plan: Ms. Saah is a very charming 37 year old premenopausal white female.  She has triple negative metastatic breast cancer.  She is BRCA positive.  Again, she had this neurological issue which is unclear.  She had a incredibly complete workup for this.  Thankfully, there is no evidence of malignancy in the brain or the CSF.  I am not sure as to why the LFTs are elevated.  We will just have to watch these for right now.  I would not think this is secondary to underlying malignancy.  Again we will have to hold the pembrolizumab today.  She is to come back next week I think to start her next cycle of Taxol.  I would not think that the Taxol would be causing LFT problems.  Again we really need to see what her CA 27.29 is.  I still think it might be a little bit early for [place] to check this since she is only had 1 real cycle of treatment.  She does look fantastic.  I am just happy that she has recovered so well.  She has shown an incredible amount of fortitude.  I would like to see her back next week.     Volanda Napoleon, MD 3/11/202410:03 AM

## 2022-04-04 NOTE — Progress Notes (Signed)
Reviewed patient labs with Dr. Marin Olp and pt ok to treat despite today's counts and to hold gemzar infusion today.

## 2022-04-04 NOTE — Patient Instructions (Signed)

## 2022-04-05 LAB — CANCER ANTIGEN 27.29: CA 27.29: 6434.9 U/mL — ABNORMAL HIGH (ref 0.0–38.6)

## 2022-04-06 ENCOUNTER — Encounter: Payer: Self-pay | Admitting: Hematology & Oncology

## 2022-04-06 ENCOUNTER — Encounter: Payer: Self-pay | Admitting: Family

## 2022-04-06 NOTE — Progress Notes (Signed)
Patient cleared for next treatment. Will add to her regimen.   Oncology Nurse Navigator Documentation     04/04/2022    1:30 PM  Oncology Nurse Navigator Flowsheets  Navigator Follow Up Date: 04/11/2022  Navigator Follow Up Reason: Follow-up Appointment;Chemotherapy  Navigator Location [facility]  Navigator Encounter Type Treatment;Appt/Treatment Plan Review  Patient Visit Type MedOnc  Treatment Phase Active Tx  Barriers/Navigation Needs Coordination of Care;Education  Interventions Psycho-Social Support  Acuity Level 2-Minimal Needs (1-2 Barriers Identified)  Support Groups/Services Friends and Family  Time Spent with Patient 15

## 2022-04-07 ENCOUNTER — Telehealth: Payer: Self-pay | Admitting: Internal Medicine

## 2022-04-07 NOTE — Telephone Encounter (Signed)
Inbound call from patient is requesting to speak to a nurse. States she has been experiencing some pain that she believes to be Chron's related. Patient states she is also going through chemotherapy and would like to discuss her options for treatment for pain.

## 2022-04-08 ENCOUNTER — Telehealth: Payer: Self-pay

## 2022-04-08 ENCOUNTER — Inpatient Hospital Stay: Payer: Commercial Managed Care - PPO

## 2022-04-08 ENCOUNTER — Ambulatory Visit (HOSPITAL_BASED_OUTPATIENT_CLINIC_OR_DEPARTMENT_OTHER)
Admission: RE | Admit: 2022-04-08 | Discharge: 2022-04-08 | Disposition: A | Discharge status: Home / Self Care | Payer: Commercial Managed Care - PPO | Source: Ambulatory Visit | Attending: Hematology & Oncology | Admitting: Hematology & Oncology

## 2022-04-08 ENCOUNTER — Other Ambulatory Visit: Payer: Self-pay

## 2022-04-08 VITALS — BP 105/72 | HR 101 | Temp 98.0°F | Resp 18

## 2022-04-08 DIAGNOSIS — C50912 Malignant neoplasm of unspecified site of left female breast: Secondary | ICD-10-CM | POA: Diagnosis present

## 2022-04-08 DIAGNOSIS — Z5111 Encounter for antineoplastic chemotherapy: Secondary | ICD-10-CM | POA: Diagnosis not present

## 2022-04-08 DIAGNOSIS — N63 Unspecified lump in unspecified breast: Secondary | ICD-10-CM

## 2022-04-08 DIAGNOSIS — D508 Other iron deficiency anemias: Secondary | ICD-10-CM

## 2022-04-08 LAB — CBC WITH DIFFERENTIAL (CANCER CENTER ONLY)
Abs Immature Granulocytes: 0.02 10*3/uL (ref 0.00–0.07)
Basophils Absolute: 0 10*3/uL (ref 0.0–0.1)
Basophils Relative: 0 %
Eosinophils Absolute: 0 10*3/uL (ref 0.0–0.5)
Eosinophils Relative: 1 %
HCT: 28.3 % — ABNORMAL LOW (ref 36.0–46.0)
Hemoglobin: 9.4 g/dL — ABNORMAL LOW (ref 12.0–15.0)
Immature Granulocytes: 1 %
Lymphocytes Relative: 89 %
Lymphs Abs: 1.8 10*3/uL (ref 0.7–4.0)
MCH: 28.4 pg (ref 26.0–34.0)
MCHC: 33.2 g/dL (ref 30.0–36.0)
MCV: 85.5 fL (ref 80.0–100.0)
Monocytes Absolute: 0 10*3/uL — ABNORMAL LOW (ref 0.1–1.0)
Monocytes Relative: 1 %
Neutro Abs: 0.2 10*3/uL — CL (ref 1.7–7.7)
Neutrophils Relative %: 8 %
Platelet Count: 64 10*3/uL — ABNORMAL LOW (ref 150–400)
RBC: 3.31 MIL/uL — ABNORMAL LOW (ref 3.87–5.11)
RDW: 21.3 % — ABNORMAL HIGH (ref 11.5–15.5)
WBC Count: 2.1 10*3/uL — ABNORMAL LOW (ref 4.0–10.5)
nRBC: 2.3 % — ABNORMAL HIGH (ref 0.0–0.2)

## 2022-04-08 LAB — CMP (CANCER CENTER ONLY)
ALT: 216 U/L — ABNORMAL HIGH (ref 0–44)
AST: 109 U/L — ABNORMAL HIGH (ref 15–41)
Albumin: 3.3 g/dL — ABNORMAL LOW (ref 3.5–5.0)
Alkaline Phosphatase: 319 U/L — ABNORMAL HIGH (ref 38–126)
Anion gap: 8 (ref 5–15)
BUN: 17 mg/dL (ref 6–20)
CO2: 20 mmol/L — ABNORMAL LOW (ref 22–32)
Calcium: 7.7 mg/dL — ABNORMAL LOW (ref 8.9–10.3)
Chloride: 104 mmol/L (ref 98–111)
Creatinine: 0.63 mg/dL (ref 0.44–1.00)
GFR, Estimated: >60 mL/min (ref >60)
Glucose, Bld: 115 mg/dL — ABNORMAL HIGH (ref 70–99)
Potassium: 3.8 mmol/L (ref 3.5–5.1)
Sodium: 132 mmol/L — ABNORMAL LOW (ref 135–145)
Total Bilirubin: 1.2 mg/dL (ref 0.3–1.2)
Total Protein: 6.2 g/dL — ABNORMAL LOW (ref 6.5–8.1)

## 2022-04-08 MED ORDER — HEPARIN SOD (PORK) LOCK FLUSH 100 UNIT/ML IV SOLN
250.0000 [IU] | INTRAVENOUS | Status: AC | PRN
  Administered 2022-04-08: 500 [IU]

## 2022-04-08 MED ORDER — SODIUM CHLORIDE 0.9% FLUSH
3.0000 mL | INTRAVENOUS | Status: AC | PRN
  Administered 2022-04-08: 10 mL

## 2022-04-08 MED ORDER — SODIUM CHLORIDE 0.9% IV SOLUTION
250.0000 mL | Freq: Once | INTRAVENOUS | Status: AC
  Administered 2022-04-08: 250 mL via INTRAVENOUS

## 2022-04-08 MED ORDER — FILGRASTIM-SNDZ 480 MCG/0.8ML IJ SOSY
480.0000 ug | PREFILLED_SYRINGE | Freq: Once | INTRAMUSCULAR | Status: AC
  Administered 2022-04-08: 480 ug via SUBCUTANEOUS
  Filled 2022-04-08: qty 0.8

## 2022-04-08 NOTE — Progress Notes (Signed)
Critical result received from lab of ANC 0.2 Dr. Marin Olp aware and order received to give Zarxio if insurance approves.  Pt educated on neutropenic precautions including good handwashing, wearing a mask, avoiding crowds and fever guidelines. Pt did teach back method to verbalize understanding.

## 2022-04-08 NOTE — Telephone Encounter (Signed)
Patient is calling to follow up on call. Please advise.

## 2022-04-08 NOTE — Patient Instructions (Signed)
Dehydration, Adult Dehydration is a condition in which there is not enough water or other fluids in the body. This happens when a person loses more fluids than they take in. Important organs cannot work right without the right amount of fluids. Any loss of fluids from the body can cause dehydration. Dehydration can be mild, worse, or very bad. It should be treated right away to keep it from getting very bad. What are the causes? Conditions that cause loss of water in the body. They include: Watery poop (diarrhea). Vomiting. Sweating a lot. Fever. Infection. Peeing (urinating) a lot. Not drinking enough fluids. Certain medicines, such as medicines that take extra fluid out of the body (diuretics). Lack of safe drinking water. Not being able to get enough water and food. What increases the risk? Having a long-term (chronic) illness that has not been treated the right way, such as: Diabetes. Heart disease. Kidney disease. Being 65 years of age or older. Having a disability. Living in a place that is high above the ground or sea (high in altitude). The thinner, drier air causes more fluid loss. Doing exercises that put stress on your body for a long time. Being active when in hot places. What are the signs or symptoms? Symptoms of dehydration depend on how bad it is. Mild or worse dehydration Thirst. Dry lips or dry mouth. Feeling dizzy or light-headed. Muscle cramps. Passing little pee or dark pee. Pee may be the color of tea. Headache. Very bad dehydration Changes in skin. Skin may: Be cold to the touch (clammy). Be blotchy or pale. Not go back to normal right after you pinch it and let it go. Little or no tears, pee, or sweat. Fast breathing. Low blood pressure. Weak pulse. Pulse that is more than 100 beats a minute when you are sitting still. Other changes, such as: Feeling very thirsty. Eyes that look hollow (sunken). Cold hands and feet. Being confused. Being very  tired (lethargic) or having trouble waking from sleep. Losing weight. Loss of consciousness. How is this treated? Treatment for this condition depends on how bad your dehydration is. Treatment should start right away. Do not wait until your condition gets very bad. Very bad dehydration is an emergency. You will need to go to a hospital. Mild or worse dehydration can be treated at home. You may be asked to: Drink more fluids. Drink an oral rehydration solution (ORS). This drink gives you the right amount of fluids, salts, and minerals (electrolytes). Very bad dehydration can be treated: With fluids through an IV tube. By correcting low levels of electrolytes in the body. By treating the problem that caused your dehydration. Follow these instructions at home: Oral rehydration solution If told by your doctor, drink an ORS: Make an ORS. Use instructions on the package. Start by drinking small amounts, about  cup (120 mL) every 5-10 minutes. Slowly drink more until you have had the amount that your doctor said to have.  Eating and drinking  Drink enough clear fluid to keep your pee pale yellow. If you were told to drink an ORS, finish the ORS first. Then, start slowly drinking other clear fluids. Drink fluids such as: Water. Do not drink only water. Doing that can make the salt (sodium) level in your body get too low. Water from ice chips you suck on. Fruit juice that you have added water to (diluted). Low-calorie sports drinks. Eat foods that have the right amounts of salts and minerals, such as bananas, oranges, potatoes,   tomatoes, or spinach. Do not drink alcohol. Avoid drinks that have caffeine or sugar. These include:: High-calorie sports drinks. Fruit juice that you did not add water to. Soda. Coffee or energy drinks. Avoid foods that are greasy or have a lot of fat or sugar. General instructions Take over-the-counter and prescription medicines only as told by your doctor. Do  not take sodium tablets. Doing that can make the salt level in your body get too high. Return to your normal activities as told by your doctor. Ask your doctor what activities are safe for you. Keep all follow-up visits. Your doctor may check and change your treatment. Contact a doctor if: You have pain in your belly (abdomen) and the pain: Gets worse. Stays in one place. You have a rash. You have a stiff neck. You get angry or annoyed more easily than normal. You are more tired or have a harder time waking than normal. You feel weak or dizzy. You feel very thirsty. Get help right away if: You have any symptoms of very bad dehydration. You vomit every time you eat or drink. Your vomiting gets worse, does not go away, or you vomit blood or green stuff. You are getting treatment, but symptoms are getting worse. You have a fever. You have a very bad headache. You have: Diarrhea that gets worse or does not go away. Blood in your poop (stool). This may cause poop to look black and tarry. No pee in 6-8 hours. Only a small amount of pee in 6-8 hours, and the pee is very dark. You have trouble breathing. These symptoms may be an emergency. Get help right away. Call 911. Do not wait to see if the symptoms will go away. Do not drive yourself to the hospital. This information is not intended to replace advice given to you by your health care provider. Make sure you discuss any questions you have with your health care provider. Document Revised: 08/09/2021 Document Reviewed: 08/09/2021 Elsevier Patient Education  2023 [employer] Inc[employer]  

## 2022-04-08 NOTE — Telephone Encounter (Signed)
Received phone call from patient stating that she has been having symptoms she feel are related to her Crohns disease. Pt states she has been calling her GI MD since middle of the week with no return phone call. Pt states she is having one extremely large BM a day and decreased appetite. Pt denies any N/V. Pt states she feels shaky and unsure of what she should do prior to the weekend. Symptoms discussed with Dr. Marin Olp and pt to come in for labs/xray and infusion. Pt educated on plan of care and verbalized understanding. Appointments made and pt aware to go to imaging first for xray.

## 2022-04-11 ENCOUNTER — Encounter: Payer: Self-pay | Admitting: Family

## 2022-04-11 ENCOUNTER — Other Ambulatory Visit: Payer: Self-pay | Admitting: *Deleted

## 2022-04-11 ENCOUNTER — Inpatient Hospital Stay: Payer: Commercial Managed Care - PPO

## 2022-04-11 ENCOUNTER — Inpatient Hospital Stay (HOSPITAL_BASED_OUTPATIENT_CLINIC_OR_DEPARTMENT_OTHER): Payer: Commercial Managed Care - PPO | Admitting: Family

## 2022-04-11 VITALS — BP 126/81 | HR 110

## 2022-04-11 VITALS — BP 133/91 | HR 115 | Temp 98.0°F | Resp 18 | Wt 115.0 lb

## 2022-04-11 DIAGNOSIS — C50912 Malignant neoplasm of unspecified site of left female breast: Secondary | ICD-10-CM

## 2022-04-11 DIAGNOSIS — D500 Iron deficiency anemia secondary to blood loss (chronic): Secondary | ICD-10-CM

## 2022-04-11 DIAGNOSIS — D649 Anemia, unspecified: Secondary | ICD-10-CM | POA: Diagnosis not present

## 2022-04-11 DIAGNOSIS — Z5111 Encounter for antineoplastic chemotherapy: Secondary | ICD-10-CM | POA: Diagnosis not present

## 2022-04-11 LAB — CBC WITH DIFFERENTIAL (CANCER CENTER ONLY)
Abs Immature Granulocytes: 0.36 10*3/uL — ABNORMAL HIGH (ref 0.00–0.07)
Basophils Absolute: 0.1 10*3/uL (ref 0.0–0.1)
Basophils Relative: 1 %
Eosinophils Absolute: 0 10*3/uL (ref 0.0–0.5)
Eosinophils Relative: 0 %
HCT: 24.7 % — ABNORMAL LOW (ref 36.0–46.0)
Hemoglobin: 8 g/dL — ABNORMAL LOW (ref 12.0–15.0)
Immature Granulocytes: 6 %
Lymphocytes Relative: 68 %
Lymphs Abs: 4.1 10*3/uL — ABNORMAL HIGH (ref 0.7–4.0)
MCH: 28.2 pg (ref 26.0–34.0)
MCHC: 32.4 g/dL (ref 30.0–36.0)
MCV: 87 fL (ref 80.0–100.0)
Monocytes Absolute: 0.6 10*3/uL (ref 0.1–1.0)
Monocytes Relative: 10 %
Neutro Abs: 0.9 10*3/uL — ABNORMAL LOW (ref 1.7–7.7)
Neutrophils Relative %: 15 %
Platelet Count: 89 10*3/uL — ABNORMAL LOW (ref 150–400)
RBC: 2.84 MIL/uL — ABNORMAL LOW (ref 3.87–5.11)
RDW: 21.7 % — ABNORMAL HIGH (ref 11.5–15.5)
WBC Count: 5.9 10*3/uL (ref 4.0–10.5)
nRBC: 10 % — ABNORMAL HIGH (ref 0.0–0.2)

## 2022-04-11 LAB — CMP (CANCER CENTER ONLY)
ALT: 225 U/L — ABNORMAL HIGH (ref 0–44)
AST: 118 U/L — ABNORMAL HIGH (ref 15–41)
Albumin: 3.2 g/dL — ABNORMAL LOW (ref 3.5–5.0)
Alkaline Phosphatase: 297 U/L — ABNORMAL HIGH (ref 38–126)
Anion gap: 6 (ref 5–15)
BUN: 8 mg/dL (ref 6–20)
CO2: 20 mmol/L — ABNORMAL LOW (ref 22–32)
Calcium: 8.5 mg/dL — ABNORMAL LOW (ref 8.9–10.3)
Chloride: 108 mmol/L (ref 98–111)
Creatinine: 0.46 mg/dL (ref 0.44–1.00)
GFR, Estimated: >60 mL/min (ref >60)
Glucose, Bld: 109 mg/dL — ABNORMAL HIGH (ref 70–99)
Potassium: 3.5 mmol/L (ref 3.5–5.1)
Sodium: 134 mmol/L — ABNORMAL LOW (ref 135–145)
Total Bilirubin: 0.9 mg/dL (ref 0.3–1.2)
Total Protein: 5.8 g/dL — ABNORMAL LOW (ref 6.5–8.1)

## 2022-04-11 LAB — PREPARE RBC (CROSSMATCH)

## 2022-04-11 LAB — LACTATE DEHYDROGENASE: LDH: 507 U/L — ABNORMAL HIGH (ref 98–192)

## 2022-04-11 LAB — SAMPLE TO BLOOD BANK

## 2022-04-11 MED ORDER — SODIUM CHLORIDE 0.9% FLUSH
10.0000 mL | INTRAVENOUS | Status: DC | PRN
  Administered 2022-04-11: 10 mL

## 2022-04-11 MED ORDER — SODIUM CHLORIDE 0.9 % IV SOLN
10.0000 mg | Freq: Once | INTRAVENOUS | Status: AC
  Administered 2022-04-11: 10 mg via INTRAVENOUS
  Filled 2022-04-11: qty 10

## 2022-04-11 MED ORDER — SODIUM CHLORIDE 0.9 % IV SOLN
90.0000 mg/m2 | Freq: Once | INTRAVENOUS | Status: AC
  Administered 2022-04-11: 144 mg via INTRAVENOUS
  Filled 2022-04-11: qty 24

## 2022-04-11 MED ORDER — SODIUM CHLORIDE 0.9 % IV SOLN: Freq: Once | INTRAVENOUS | Status: AC

## 2022-04-11 MED ORDER — SODIUM CHLORIDE 0.9 % IV SOLN
150.0000 mg | Freq: Once | INTRAVENOUS | Status: AC
  Administered 2022-04-11: 150 mg via INTRAVENOUS
  Filled 2022-04-11: qty 5

## 2022-04-11 MED ORDER — SODIUM CHLORIDE 0.9 % IV SOLN
800.0000 mg/m2 | Freq: Once | INTRAVENOUS | Status: AC
  Administered 2022-04-11: 1254 mg via INTRAVENOUS
  Filled 2022-04-11: qty 26.98

## 2022-04-11 MED ORDER — SODIUM CHLORIDE 0.9 % IV SOLN
200.0000 mg | Freq: Once | INTRAVENOUS | Status: AC
  Administered 2022-04-11: 200 mg via INTRAVENOUS
  Filled 2022-04-11: qty 8

## 2022-04-11 MED ORDER — HEPARIN SOD (PORK) LOCK FLUSH 100 UNIT/ML IV SOLN
500.0000 [IU] | Freq: Once | INTRAVENOUS | Status: AC | PRN
  Administered 2022-04-11: 500 [IU]

## 2022-04-11 MED ORDER — DIPHENHYDRAMINE HCL 50 MG/ML IJ SOLN
50.0000 mg | Freq: Once | INTRAMUSCULAR | Status: AC
  Administered 2022-04-11: 50 mg via INTRAVENOUS
  Filled 2022-04-11: qty 1

## 2022-04-11 MED ORDER — FAMOTIDINE IN NACL 20-0.9 MG/50ML-% IV SOLN
20.0000 mg | Freq: Once | INTRAVENOUS | Status: AC
  Administered 2022-04-11: 20 mg via INTRAVENOUS
  Filled 2022-04-11: qty 50

## 2022-04-11 MED ORDER — PALONOSETRON HCL INJECTION 0.25 MG/5ML
0.2500 mg | Freq: Once | INTRAVENOUS | Status: AC
  Administered 2022-04-11: 0.25 mg via INTRAVENOUS
  Filled 2022-04-11: qty 5

## 2022-04-11 NOTE — Patient Instructions (Signed)
[facility]  Discharge Instructions: Thank you for choosing [facility] to provide your oncology and hematology care.   If you have a lab appointment with the [facility], please go directly to the [facility] and check in at the registration area.  Wear comfortable clothing and clothing appropriate for easy access to any Portacath or PICC line.   We strive to give you quality time with your provider. You may need to reschedule your appointment if you arrive late (15 or more minutes).  Arriving late affects you and other patients whose appointments are after yours.  Also, if you miss three or more appointments without notifying the office, you may be dismissed from the clinic at the provider's discretion.      For prescription refill requests, have your pharmacy contact our office and allow 72 hours for refills to be completed.    Today you received the following chemotherapy and/or immunotherapy agents:  Keytruda, Gemzar and Taxol      To help prevent nausea and vomiting after your treatment, we encourage you to take your nausea medication as directed.  BELOW ARE SYMPTOMS THAT SHOULD BE REPORTED IMMEDIATELY: *FEVER GREATER THAN 100.4 F (38 C) OR HIGHER *CHILLS OR SWEATING *NAUSEA AND VOMITING THAT IS NOT CONTROLLED WITH YOUR NAUSEA MEDICATION *UNUSUAL SHORTNESS OF BREATH *UNUSUAL BRUISING OR BLEEDING *URINARY PROBLEMS (pain or burning when urinating, or frequent urination) *BOWEL PROBLEMS (unusual diarrhea, constipation, pain near the anus) TENDERNESS IN MOUTH AND THROAT WITH OR WITHOUT PRESENCE OF ULCERS (sore throat, sores in mouth, or a toothache) UNUSUAL RASH, SWELLING OR PAIN  UNUSUAL VAGINAL DISCHARGE OR ITCHING   Items with * indicate a potential emergency and should be followed up as soon as possible or go to the Emergency Department if any problems should occur.  Please show the CHEMOTHERAPY ALERT CARD or IMMUNOTHERAPY  ALERT CARD at check-in to the Emergency Department and triage nurse. Should you have questions after your visit or need to cancel or reschedule your appointment, please contact [facility]  [phone] and follow the prompts.  Office hours are 8:00 a.m. to 4:30 p.m. Monday - Friday. Please note that voicemails left after 4:00 p.m. may not be returned until the following business day.  We are closed weekends and major holidays. You have access to a nurse at all times for urgent questions. Please call the main number to the clinic [phone] and follow the prompts.  For any non-urgent questions, you may also contact your provider using MyChart. We now offer e-Visits for anyone 69 and older to request care online for non-urgent symptoms. For details visit [email or web address].   Also download the MyChart app! Go to the app store, search "MyChart", open the app, select [facility], and log in with your MyChart username and password.

## 2022-04-11 NOTE — Progress Notes (Signed)
OK to treat with AST-118, ALT-225, HGB-8.0, Plts-89 and ANC-0.9 and HR-110 per order of Dr. Marin Olp.

## 2022-04-11 NOTE — Telephone Encounter (Signed)
The pt has been contacted and states she has gotten better and has a virtual with Dr Hilarie Fredrickson tomorrow.  She will keep that and discuss her complaints at that time.

## 2022-04-11 NOTE — Patient Instructions (Signed)

## 2022-04-11 NOTE — Progress Notes (Signed)
Hematology and Oncology Follow Up Visit  Carla Clark [id number] [birth year] 37 y.o. 04/11/2022   Principle Diagnosis:  Stage IV triple negative breast cancer-bone metastasis/lymph no metastasis -- BRCA (+)   Current Therapy:        Taxol/Gemzar/pembrolizumab - s/p cycle #1  -- start on 03/02/2022 --Gemzar added on 04/11/2022 Xgeva 120 mg sq q 3 months -- next dose 03/02/2022   Interim History:  Carla Clark is here today with her sweet mom for follow-up and treatment. She is doing fairly well but is still dealing with what seems like a Chron's flare.  She is eating milder foods and trying her best to get in a good amount of protein. This has been tedious for her.  She has contacted GI and also has a virtual appointment with Dr. Billie Lade tomorrow She notes that her abdominal pain is better when she doesn't eat.   Hgb is down at 8.0, MCV 87, platelets 89 and WBC count is 5.9.  LFT's remain elevated.   She is feeling fatigued and has noted dizziness and SOB with exertion.  She is doing her best to stay well hydrated throughout the day. Her weight is down 4 lbs at 115 lbs today.  She still has the occasional nose bleed and has had success with the Nampon to stop.  She also noted some blood mucous with BM off and on.  She notes some numbness and tingling in her hands but feels this may be due to tensing up with stress.  No swelling or tenderness in her extremities at this time.  No falls or syncope reported.   ECOG Performance Status: 1 - Symptomatic but completely ambulatory  Medications:  Allergies as of 04/11/2022       Reactions   Other Other (See Comments)   History of Crohn's disease- NO seeds, tomatoes, anything that doesn't digest well, etc.....        Medication List        Accurate as of April 11, 2022 12:35 PM. If you have any questions, ask your nurse or doctor.          cefdinir 300 MG capsule Commonly known as: OMNICEF Take 2 capsules (600 mg total) by mouth  daily.   cyanocobalamin 100 MCG tablet Commonly known as: VITAMIN B12 Take 100 mcg by mouth daily.   docusate sodium 100 MG capsule Commonly known as: COLACE Take 1 capsule (100 mg total) by mouth 2 (two) times daily.   fluconazole 100 MG tablet Commonly known as: DIFLUCAN Take 1 tablet (100 mg total) by mouth daily.   hydrocortisone 2.5 % rectal cream Commonly known as: ANUSOL-HC Place 1 Application rectally 2 (two) times daily.   lidocaine-prilocaine cream Commonly known as: EMLA Apply 1 Application topically See admin instructions. Apply a dime-size of cream to port-a-cath 1-2 hours prior to access. Cover with [employer].   melatonin 3 MG Tabs tablet Take 3 mg by mouth at bedtime as needed.   ondansetron 8 MG tablet Commonly known as: Zofran Take 1 tablet (8 mg total) by mouth every 8 (eight) hours as needed for nausea or vomiting.   pantoprazole 40 MG tablet Commonly known as: PROTONIX Take 1 tablet (40 mg total) by mouth 2 (two) times daily.   polyethylene glycol 17 g packet Commonly known as: MIRALAX / GLYCOLAX Take 17 g by mouth daily as needed for mild constipation.   predniSONE 10 MG tablet Commonly known as: DELTASONE Take 8 tablets (80 mg total)  by mouth daily with breakfast.   prochlorperazine 10 MG tablet Commonly known as: COMPAZINE Take 1 tablet (10 mg total) by mouth every 6 (six) hours as needed for nausea or vomiting.   pyridoxine 250 MG tablet Commonly known as: Neuro-K-250 Vitamin B6 Take 2 tablets (500 mg total) by mouth daily.   SKYRIZI  Inject into the skin. 03/21/2022 Patch x 10 minutes every 8 weeks.   traMADol 50 MG tablet Commonly known as: ULTRAM Take 50 mg by mouth every 6 (six) hours as needed (for pain).        Allergies:  Allergies  Allergen Reactions   Other Other (See Comments)    History of Crohn's disease- NO seeds, tomatoes, anything that doesn't digest well, etc.....    Past Medical History, Surgical history,  Social history, and Family History were reviewed and updated.  Review of Systems: All other 10 point review of systems is negative.   Physical Exam:  weight is 115 lb (52.2 kg). Her oral temperature is 98 F (36.7 C). Her blood pressure is 133/91 (abnormal) and her pulse is 115 (abnormal). Her respiration is 18 and oxygen saturation is 100%.   Wt Readings from Last 3 Encounters:  04/11/22 115 lb (52.2 kg)  04/04/22 119 lb (54 kg)  03/28/22 120 lb (54.4 kg)    Ocular: Sclerae unicteric, pupils equal, round and reactive to light Ear-nose-throat: Oropharynx clear, dentition fair Lymphatic: No cervical or supraclavicular adenopathy Lungs no rales or rhonchi, good excursion bilaterally Heart regular rate and rhythm, no murmur appreciated Abd soft, nontender, positive bowel sounds MSK no focal spinal tenderness, no joint edema Neuro: non-focal, well-oriented, appropriate affect Breasts: Deferred this visit.   Lab Results  Component Value Date   WBC 5.9 04/11/2022   HGB 8.0 (L) 04/11/2022   HCT 24.7 (L) 04/11/2022   MCV 87.0 04/11/2022   PLT 89 (L) 04/11/2022   Lab Results  Component Value Date   FERRITIN 1,147 (H) 03/28/2022   IRON 244 (H) 03/28/2022   TIBC 251 03/28/2022   UIBC 7 (L) 03/28/2022   IRONPCTSAT 97 (H) 03/28/2022   Lab Results  Component Value Date   RETICCTPCT 0.7 08/24/2021   RBC 2.84 (L) 04/11/2022   RETICCTABS 13.7 (L) 01/20/2015   No results found for: "KPAFRELGTCHN", "LAMBDASER", "KAPLAMBRATIO" Lab Results  Component Value Date   IGA 187 03/11/2016   No results found for: "TOTALPROTELP", "ALBUMINELP", "A1GS", "A2GS", "BETS", "BETA2SER", "GAMS", "MSPIKE", "SPEI"   Chemistry      Component Value Date/Time   NA 134 (L) 04/11/2022 1027   K 3.5 04/11/2022 1027   CL 108 04/11/2022 1027   CO2 20 (L) 04/11/2022 1027   BUN 8 04/11/2022 1027   CREATININE 0.46 04/11/2022 1027   CREATININE 0.62 08/20/2012 1139      Component Value Date/Time   CALCIUM  8.5 (L) 04/11/2022 1027   ALKPHOS 297 (H) 04/11/2022 1027   AST 118 (H) 04/11/2022 1027   ALT 225 (H) 04/11/2022 1027   BILITOT 0.9 04/11/2022 1027       Impression and Plan: Carla Clark is a very charming 37 yo premenopausal white female with triple negative metastatic breast cancer, BRCA positive.  CA 27.29 was 6,434 last week. Today's result is pending.  We will proceed with treatment today per Dr. Marin Olp including first cycle of Gemzar.  We will bring her back tomorrow for 2 units of blood.  Lab check and fluids if needed next week on Monday and follow-up in 2  weeks.   Lottie Dawson, NP 3/18/202412:35 PM

## 2022-04-12 ENCOUNTER — Encounter: Payer: Self-pay | Admitting: *Deleted

## 2022-04-12 ENCOUNTER — Inpatient Hospital Stay: Payer: Commercial Managed Care - PPO

## 2022-04-12 ENCOUNTER — Ambulatory Visit: Payer: Commercial Managed Care - PPO | Admitting: Psychologist

## 2022-04-12 ENCOUNTER — Telehealth (INDEPENDENT_AMBULATORY_CARE_PROVIDER_SITE_OTHER): Payer: Commercial Managed Care - PPO | Admitting: Internal Medicine

## 2022-04-12 DIAGNOSIS — D649 Anemia, unspecified: Secondary | ICD-10-CM

## 2022-04-12 DIAGNOSIS — C50912 Malignant neoplasm of unspecified site of left female breast: Secondary | ICD-10-CM

## 2022-04-12 DIAGNOSIS — R197 Diarrhea, unspecified: Secondary | ICD-10-CM | POA: Diagnosis not present

## 2022-04-12 DIAGNOSIS — R7989 Other specified abnormal findings of blood chemistry: Secondary | ICD-10-CM | POA: Diagnosis not present

## 2022-04-12 DIAGNOSIS — K50019 Crohn's disease of small intestine with unspecified complications: Secondary | ICD-10-CM | POA: Diagnosis not present

## 2022-04-12 DIAGNOSIS — R109 Unspecified abdominal pain: Secondary | ICD-10-CM

## 2022-04-12 DIAGNOSIS — Z171 Estrogen receptor negative status [ER-]: Secondary | ICD-10-CM

## 2022-04-12 DIAGNOSIS — Z5111 Encounter for antineoplastic chemotherapy: Secondary | ICD-10-CM | POA: Diagnosis not present

## 2022-04-12 LAB — CANCER ANTIGEN 27.29: CA 27.29: 3912.9 U/mL — ABNORMAL HIGH (ref 0.0–38.6)

## 2022-04-12 MED ORDER — DIPHENHYDRAMINE HCL 25 MG PO CAPS
25.0000 mg | ORAL_CAPSULE | Freq: Once | ORAL | Status: AC
  Administered 2022-04-12: 25 mg via ORAL
  Filled 2022-04-12: qty 1

## 2022-04-12 MED ORDER — SODIUM CHLORIDE 0.9% IV SOLUTION: 250.0000 mL | Freq: Once | INTRAVENOUS | Status: DC

## 2022-04-12 MED ORDER — ACETAMINOPHEN 325 MG PO TABS
650.0000 mg | ORAL_TABLET | Freq: Once | ORAL | Status: AC
  Administered 2022-04-12: 650 mg via ORAL
  Filled 2022-04-12: qty 2

## 2022-04-12 MED ORDER — SODIUM CHLORIDE 0.9% FLUSH
10.0000 mL | INTRAVENOUS | Status: AC | PRN
  Administered 2022-04-12: 10 mL

## 2022-04-12 MED ORDER — HEPARIN SOD (PORK) LOCK FLUSH 100 UNIT/ML IV SOLN
500.0000 [IU] | Freq: Every day | INTRAVENOUS | Status: AC | PRN
  Administered 2022-04-12: 500 [IU]

## 2022-04-12 MED ORDER — HEPARIN SOD (PORK) LOCK FLUSH 100 UNIT/ML IV SOLN: 250.0000 [IU] | INTRAVENOUS | Status: DC | PRN

## 2022-04-12 NOTE — Progress Notes (Signed)
Subjective:    Patient ID: Carla Clark, female    DOB: 04/16/1985, 37 y.o.   MRN: [id number]  This service was provided via telemedicine with audio/visual communication. The patient was located at home The provider was located in provider's GI office. The patient did consent to this telephone visit and is aware of possible charges through their insurance for this visit.   The persons participating in this telemedicine service were the patient and I. 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  She is seen by video visit today.  Since seeing me she has been started on chemotherapy and is taking both Keytruda and Gemzar.  Her Gemzar first infusion was yesterday.  She has been having ongoing problem with nosebleed and has been using nasal tampons to try to help tamponade the bleeding.  This is related to her low platelet count.  She has 2 unit packed red cell transfusion scheduled for later today.  She was admitted in February for acute metabolic encephalopathy which fortunately improved.  She has also been dealing with liver enzyme elevation for unclear reason.  She did complete her Orson Ape induction with her last infusion in January.  She was due to have an on body dose of Skyrizi on 03/21/2022 but this was held due to her liver enzyme elevation.  On 03/28/2022 she was seen by oncology.  At that time she was having no specific GI complaint.  Cefdinir was prescribed for a sinus infection along with fluconazole, pantoprazole and high-dose prednisone at 80 mg a day.  She took these medications through the 11th  and then reduced the prednisone to 40 mg and stopped all the medications on 04/07/2022. -- She feels that she had a "flare" of her Crohn's disease as result of possibly this medication combination.  She felt constipated but did not take a laxative.  She then developed mid and lower abdominal pain on the right, mucus in her stool and then loose diarrheal stools.  Now about 3 times per day.  She has been trying to eat bland foods and for a while she was avoiding foods altogether other than liquid and boost supplement because of her loose stool and lower pain.  The right-sided pain is a bit better but she is having some lower suprapubic pain before bowel movement.  She has not seen any definite blood in her stools.  She is not having significant nausea or vomiting.  She is not having abdominal bloating.  She has not been on prednisone since 04/07/2022  Her mood for the most part is positive but she states that this is certainly "not linear" and she will have "waves" of sadness.  She was previously treated with Humira and azathioprine, and then infliximab.  Infliximab was started after enterocolonic fistula repair and right hemicolectomy in August 2021.  Her last dose of infliximab was in September 2023 and she has had 2 of 3 induction infusions with [name].  We changed to [facility] because of ongoing eczema and the hypothesis that infliximab was worsening her skin.  Review of Systems As per HPI, otherwise negative  Current Medications, Allergies, Past Medical History,  Past Surgical History, Family History and Social History were reviewed in [employer] record.    Objective:   Physical Exam No vitals taken today in virtual visit By video visit she has a bloody nasal tampon in the right nare     Latest Ref Rng & Units 04/11/2022   10:27 AM 04/08/2022    2:35 PM 04/04/2022    9:15 AM  CBC  WBC 4.0 - 10.5 K/uL 5.9  2.1  6.1   Hemoglobin 12.0 - 15.0 g/dL 8.0  9.4  8.6    Hematocrit 36.0 - 46.0 % 24.7  28.3  27.1   Platelets 150 - 400 K/uL 89  64  92    Iron/TIBC/Ferritin/ %Sat    Component Value Date/Time   IRON 244 (H) 03/28/2022 0910   IRON 70 01/11/2017 1514   TIBC 251 03/28/2022 0910   TIBC 299 01/11/2017 1514   FERRITIN 1,147 (H) 03/28/2022 0910   FERRITIN 25 01/11/2017 1514   IRONPCTSAT 97 (H) 03/28/2022 0910   IRONPCTSAT 23 01/11/2017 1514   CMP     Component Value Date/Time   NA 134 (L) 04/11/2022 1027   K 3.5 04/11/2022 1027   CL 108 04/11/2022 1027   CO2 20 (L) 04/11/2022 1027   GLUCOSE 109 (H) 04/11/2022 1027   BUN 8 04/11/2022 1027   CREATININE 0.46 04/11/2022 1027   CREATININE 0.62 08/20/2012 1139   CALCIUM 8.5 (L) 04/11/2022 1027   PROT 5.8 (L) 04/11/2022 1027   ALBUMIN 3.2 (L) 04/11/2022 1027   AST 118 (H) 04/11/2022 1027   ALT 225 (H) 04/11/2022 1027   ALKPHOS 297 (H) 04/11/2022 1027   BILITOT 0.9 04/11/2022 1027   GFRNONAA >60 04/11/2022 1027   GFRAA >60 04/18/2015 0433   Lab Results  Component Value Date   INR 1.2 03/08/2022   INR 1.3 (H) 03/07/2022      Assessment & Plan:  37 year old female with a history of fistulizing and stricturing Crohn's disease, 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   Fistulizing and stricturing ileal Crohn's disease/prior right hemicolectomy and fistula repair (previous treatment with adalimumab (antibody formation); azathioprine, infliximab --stopped due to skin eczema; Skyrizi induction completed January 2024) -- I am not convinced that she is having a true "flare" of her Crohn's disease because the symptoms started after she was given an antibiotic for sinusitis but was also taking 80 mg of prednisone.  Her Crohn's disease  should not flare on high-dose steroid.  While I cannot be sure that is not Crohn's disease I am more suspicious for possible infectious etiology or chemotherapy side effect. -- Check fecal calprotectin; check C. difficile PCR and stool culture -- Remain off prednisone for now unless oncology feels otherwise -- Skyrizi on body injector on hold due to elevated liver enzymes, see #2  2.  Elevated liver enzymes --predominantly hepatocellular in nature.  I am more suspicious for chemotherapy induced liver enzyme elevation as opposed to [name].  Either way Orson Ape is on hold.  I recommend liver imaging given her metastatic breast cancer -- MRI abdomen with and without contrast  3.  Stage IV breast cancer --currently receiving chemotherapy under the direction of Dr. Marin Olp  4.  IDA --previously secondary to #1 though now she is  having chemotherapy induced thrombocytopenia and epistaxis. -- Per oncology, plan for 2 unit blood cell transfusion today  5.  B12 deficiency --continue B12  8-week or so follow-up with me, sooner if needed  40 minutes total spent today including patient facing time, coordination of care, reviewing medical history/procedures/pertinent radiology studies, and documentation of the encounter.

## 2022-04-12 NOTE — Patient Instructions (Signed)
Blood Transfusion, Adult A blood transfusion is a procedure in which you receive blood or a type of blood cell (blood component) through an IV. You may need a blood transfusion when you have a low blood count, which is a low number of any blood cell. This may result from a bleeding disorder, illness, injury, or surgery. The blood may come from a donor, or you may be able to have your own blood collected and stored (autologous blood donation) before a planned surgery. The blood given in a transfusion may be made up of different blood components. You may receive: Red blood cells. These carry oxygen to the cells in the body. Platelets. These help your blood to clot. Plasma. This is the liquid part of your blood. It carries proteins and other substances throughout the body. White blood cells. These help you fight infections. If you have hemophilia or another clotting disorder, you may also receive other types of blood products. Depending on the type of blood product, this procedure may take 1-4 hours to complete. Tell a health care provider about: Any bleeding problems you have. Any previous reactions you have had during a blood transfusion. Any allergies you have. All medicines you are taking, including vitamins, herbs, eye drops, creams, and over-the-counter medicines. Any surgeries you have had. Any medical conditions you have. Whether you are pregnant or may be pregnant. What are the risks? Talk with your health care provider about risks. The most common problems include: A mild allergic reaction, such as red, swollen areas of skin (hives) and itching. Fever or chills. This may be the body's response to new blood cells received. This may occur during or up to 4 hours after the transfusion. More serious problems may include: A serious allergic reaction that causes difficulty breathing or swelling around the face and lips. Transfusion-associated circulatory overload (TACO), or too much fluid in  the lungs. This may cause breathing problems. Transfusion-related acute lung injury (TRALI), which causes breathing difficulty and low oxygen in the blood. This can occur within hours of the transfusion or several days later. Iron overload. This can happen after receiving many blood transfusions over a period of time. Infection or virus being transmitted. This is rare because donated blood is carefully tested before it is given. Hemolytic transfusion reaction. This is rare. It happens when the body's defense system (immune system)tries to attack the new blood cells. Symptoms may include fever, chills, nausea, low blood pressure, and low back or chest pain. Transfusion-associated graft-versus-host disease (TAGVHD). This is rare. It happens when donated cells attack the body's healthy tissues. What happens before the procedure? You will have a blood test to check your blood type. This test is done to know what kind of blood your body will accept and to match it to the donor blood. If you are going to have a planned surgery, you may be able to do an autologous blood donation. This may be done in case you need to have a transfusion. You will have your temperature, blood pressure, and pulse checked before the transfusion. If you have had an allergic reaction to a transfusion in the past, you may be given medicine to help prevent a reaction. This medicine may be given to you by mouth (orally) or through an IV. What happens during the procedure?  An IV will be inserted into one of your veins. The bag of blood will be attached to your IV. The blood will then enter through your vein. Your temperature, blood pressure,   and pulse will be monitored during the transfusion. This monitoring is done to detect early signs of a transfusion reaction. Tell your nurse right away if you have any of these symptoms during the transfusion: Shortness of breath or trouble breathing. Chest or back pain. Fever or  chills. Itching or hives. If you have any signs or symptoms of a reaction, your transfusion will be stopped and you may be given medicine. When the transfusion is complete, your IV will be removed. Pressure may be applied to the IV site for a few minutes. A bandage (dressing)will be applied. The procedure may vary among health care providers and hospitals. What happens after the procedure? Your temperature, blood pressure, pulse, breathing rate, and blood oxygen level will be monitored until you leave the hospital or clinic. Your blood may be tested to see how you have responded to the transfusion. You may be warmed with fluids or blankets to maintain a normal body temperature. If you receive your blood transfusion in an outpatient setting, you will be told whom to contact to report any reactions. Where to find more information Visit the [employer] Red Cross: redcross.org Summary A blood transfusion is a procedure in which you receive blood or a type of blood cell (blood component) through an IV. The blood given in a transfusion may be made up of different blood components. You may receive red blood cells, platelets, plasma, or white blood cells depending on the condition treated. Your temperature, blood pressure, and pulse will be monitored before, during, and after the transfusion. After the transfusion, your blood may be tested to see how your body has responded. This information is not intended to replace advice given to you by your health care provider. Make sure you discuss any questions you have with your health care provider. Document Revised: 04/09/2021 Document Reviewed: 04/09/2021 Elsevier Patient Education  2023 Elsevier Inc.  

## 2022-04-12 NOTE — Progress Notes (Signed)
Please call and let her know that the tumor level actually went down by almost half.  It is now 4000.

## 2022-04-12 NOTE — Progress Notes (Signed)
Patient received second cycle, day 15. She is anemic with a current crohns flare so she will return today for blood transfusion.   Oncology Nurse Navigator Documentation     04/12/2022   10:00 AM  Oncology Nurse Navigator Flowsheets  Navigator Follow Up Date: 04/25/2022  Navigator Follow Up Reason: Follow-up Appointment;Chemotherapy  Navigator Location [facility]  Navigator Encounter Type Appt/Treatment Plan Review  Patient Visit Type MedOnc  Treatment Phase Active Tx  Barriers/Navigation Needs Coordination of Care;Education  Interventions None Required  Acuity Level 2-Minimal Needs (1-2 Barriers Identified)  Support Groups/Services Friends and Family  Time Spent with Patient 15

## 2022-04-12 NOTE — Patient Instructions (Signed)
Your provider has requested that you go to the basement level for lab work.  You have been scheduled for an MRI at [facility] radiology department on 04/15/22. Your appointment time is 3:00pm. Please arrive to admitting (at main entrance of the hospital) 30 minutes prior to your appointment time for registration purposes. Please make certain not to have anything to eat or drink 6 hours prior to your test. In addition, if you have any metal in your body, have a pacemaker or defibrillator, please be sure to let your ordering physician know. This test typically takes 45 minutes to 1 hour to complete. Should you need to reschedule, please call [phone] to do so.

## 2022-04-13 ENCOUNTER — Telehealth: Payer: Self-pay

## 2022-04-13 ENCOUNTER — Ambulatory Visit (INDEPENDENT_AMBULATORY_CARE_PROVIDER_SITE_OTHER): Payer: Commercial Managed Care - PPO | Admitting: Psychologist

## 2022-04-13 ENCOUNTER — Other Ambulatory Visit: Payer: Commercial Managed Care - PPO

## 2022-04-13 DIAGNOSIS — F320 Major depressive disorder, single episode, mild: Secondary | ICD-10-CM | POA: Diagnosis not present

## 2022-04-13 DIAGNOSIS — R7989 Other specified abnormal findings of blood chemistry: Secondary | ICD-10-CM

## 2022-04-13 DIAGNOSIS — Z634 Disappearance and death of family member: Secondary | ICD-10-CM | POA: Diagnosis not present

## 2022-04-13 DIAGNOSIS — R197 Diarrhea, unspecified: Secondary | ICD-10-CM

## 2022-04-13 DIAGNOSIS — R109 Unspecified abdominal pain: Secondary | ICD-10-CM

## 2022-04-13 DIAGNOSIS — C50912 Malignant neoplasm of unspecified site of left female breast: Secondary | ICD-10-CM

## 2022-04-13 DIAGNOSIS — K50019 Crohn's disease of small intestine with unspecified complications: Secondary | ICD-10-CM

## 2022-04-13 LAB — TYPE AND SCREEN
ABO/RH(D): B POS
Antibody Screen: NEGATIVE
Unit division: 0
Unit division: 0

## 2022-04-13 LAB — BPAM RBC
Blood Product Expiration Date: 202404092359
Blood Product Expiration Date: 202404102359
ISSUE DATE / TIME: 202403190749
ISSUE DATE / TIME: 202403190749
Unit Type and Rh: 7300
Unit Type and Rh: 7300

## 2022-04-13 NOTE — Progress Notes (Signed)
[facility] Counselor/Therapist Progress Note  Patient ID: Carla Clark, MRN: [id number],    Date: 04/13/2022  Time Spent: 12:03 pm to 12:47 pm; total time: 44 minutes   This session was held via video webex teletherapy due to the coronavirus risk at this time. The patient consented to video teletherapy and was located at her home during this session. She is aware it is the responsibility of the patient to secure confidentiality on her end of the session. The provider was in a private home office for the duration of this session. Limits of confidentiality were discussed with the patient.   Treatment Type: Individual Therapy  Reported Symptoms: Experiencing stress to different factors in life  Mental Status Exam: Appearance:  Well Groomed     Behavior: Appropriate  Motor: Normal  Speech/Language:  Clear and Coherent  Affect: Appropriate  Mood: normal  Thought process: normal  Thought content:   WNL  Sensory/Perceptual disturbances:   WNL  Orientation: oriented to person, place, and time/date  Attention: Good  Concentration: Good  Memory: WNL  Fund of knowledge:  Good  Insight:   Good  Judgment:  Good  Impulse Control: Good   Risk Assessment: Danger to Self:  No Self-injurious Behavior: No Danger to Others: No Duty to Warn:no Physical Aggression / Violence:No  Access to Firearms a concern: No  Gang Involvement:No   Subjective: Beginning the session, patient described herself as doing well disclosing that her tumor marker has shrunken and that she is now adding additional treatments. From there, she reflected on some challenges with her mom. She also talked about people wanting to assist her and her family. She ended the session by reflecting on sharing information with her children in an age appropriate manner. She was agreeable to following up. She denied suicidal and homicidal ideation.   Interventions:  Worked on developing a therapeutic relationship  with the patient using active listening and reflective statements. Provided emotional support using empathy and validation. Reviewed the treatment plan with the patient. Reviewed events since the last session. Praised the patient for having positive news. Explored plans for the upcoming weekend. Identified goals for the session. Used socratic questions to assist the patient. Processed some of the frustrations with her mother. Validated frustrations. Assisted in problem solving. Used-self disclosure to assist the patient. Processed different ways of receiving help that do not involve food. Encouraged patient to lean in on the medical team with assistance regarding sharing information with children. Praised patient for sharing information in an age appropriate way with children. Discussed different options moving forward with counseling. Discussed next steps. Provided empathic statements. Assessed for suicidal and homicidal ideation.   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   Plan:  Goals Alleviate depressive symptoms Recognize, accept, and cope with depressive feelings Develop healthy thinking patterns Develop healthy interpersonal relationships Begin a healthy grieving process  Objectives target date for all objectives is 07/01/2022 Cooperate with a medication evaluation by a physician Verbalize an accurate understanding of depression Verbalize an understanding of the treatment Identify and replace thoughts that support depression Learn and implement behavioral strategies Verbalize an understanding and resolution of current interpersonal problems Learn and implement problem solving and decision making skills Learn and implement conflict resolution skills to resolve interpersonal problems Verbalize an understanding  of healthy and unhealthy emotions verbalize insight into  how past relationships may be influence current experiences with depression Use mindfulness and acceptance strategies and increase value based behavior  Increase hopeful statements about the future.  Tell in detail the story of the current loss that is triggering symptoms Read books on the topic of grief Watch videos on the theme of grief Begin verbalizing feelings associated with the loss Attend a grief support group express thoughts and feelings about the deceased Identify and voice positives about the deceased implement acts of spiritual faith  Interventions Consistent with treatment model, discuss how change in cognitive, behavioral, and interpersonal can help client alleviate depression CBT Behavioral activation help the client explore the relationship, nature of the dispute,  Help the client develop new interpersonal skills and relationships Conduct Problem so living therapy Teach conflict resolution skills Use a process-experiential approach Conduct TLDP Conduct ACT Evaluate need for psychotropic medication Monitor adherence to medication  create a safe environment and actively build trust use empathy, compassion, and support ask the patient to write a letter to the lost person conduct empty chair ask the patient to discuss and list the positives and negative aspects of the person encourage patient to rely upon his/her spiritual faith  ask client to read books on grief ask patient to watch videos about grief assist patient in identifying emotions  ask patient to attend support group   The patient and clinician reviewed the treatment plan on 07/23/2021. The patient approved of the treatment plan.     Conception Chancy, PsyD

## 2022-04-13 NOTE — Telephone Encounter (Signed)
Received phone call from patient inquiring about amount of radiation she has received with CT scans and MRI's every month since December. Pt states she has another MRI per her GI MD on 04/15/2022 and concerned. Spoke with Dr. Marin Olp and pt educated that there is no radiation during MRI's.  Pt verbalized understanding and had no further questions.

## 2022-04-14 LAB — CLOSTRIDIUM DIFFICILE BY PCR: Toxigenic C. Difficile by PCR: POSITIVE — AB

## 2022-04-15 ENCOUNTER — Ambulatory Visit (HOSPITAL_COMMUNITY)
Admission: RE | Admit: 2022-04-15 | Discharge: 2022-04-15 | Disposition: A | Discharge status: Home / Self Care | Payer: Commercial Managed Care - PPO | Source: Ambulatory Visit | Attending: Internal Medicine | Admitting: Internal Medicine

## 2022-04-15 ENCOUNTER — Encounter: Payer: Self-pay | Admitting: Hematology & Oncology

## 2022-04-15 ENCOUNTER — Other Ambulatory Visit: Payer: Self-pay

## 2022-04-15 ENCOUNTER — Telehealth: Payer: Self-pay | Admitting: Internal Medicine

## 2022-04-15 DIAGNOSIS — Z171 Estrogen receptor negative status [ER-]: Secondary | ICD-10-CM | POA: Insufficient documentation

## 2022-04-15 DIAGNOSIS — K50019 Crohn's disease of small intestine with unspecified complications: Secondary | ICD-10-CM

## 2022-04-15 DIAGNOSIS — R109 Unspecified abdominal pain: Secondary | ICD-10-CM | POA: Diagnosis present

## 2022-04-15 DIAGNOSIS — C50912 Malignant neoplasm of unspecified site of left female breast: Secondary | ICD-10-CM | POA: Insufficient documentation

## 2022-04-15 DIAGNOSIS — R7989 Other specified abnormal findings of blood chemistry: Secondary | ICD-10-CM | POA: Insufficient documentation

## 2022-04-15 DIAGNOSIS — R197 Diarrhea, unspecified: Secondary | ICD-10-CM

## 2022-04-15 MED ORDER — GADOBUTROL 1 MMOL/ML IV SOLN
5.5000 mL | Freq: Once | INTRAVENOUS | Status: AC | PRN
  Administered 2022-04-15: 5.5 mL via INTRAVENOUS

## 2022-04-15 MED ORDER — VANCOMYCIN HCL 125 MG PO CAPS: 125.0000 mg | ORAL_CAPSULE | Freq: Four times a day (QID) | ORAL | 0 refills | Status: DC

## 2022-04-15 NOTE — Telephone Encounter (Signed)
PT is having intense stomach pains. She is also having issues keeping food down and digested as well. Please advise.

## 2022-04-15 NOTE — Telephone Encounter (Signed)
See result note. Pt positive for cdiff, vanc called in for pt.

## 2022-04-17 LAB — STOOL CULTURE: E coli, Shiga toxin Assay: NEGATIVE

## 2022-04-18 ENCOUNTER — Inpatient Hospital Stay: Payer: Commercial Managed Care - PPO

## 2022-04-18 ENCOUNTER — Other Ambulatory Visit: Payer: Self-pay | Admitting: *Deleted

## 2022-04-18 DIAGNOSIS — Z95828 Presence of other vascular implants and grafts: Secondary | ICD-10-CM

## 2022-04-18 DIAGNOSIS — C50912 Malignant neoplasm of unspecified site of left female breast: Secondary | ICD-10-CM

## 2022-04-18 DIAGNOSIS — Z5111 Encounter for antineoplastic chemotherapy: Secondary | ICD-10-CM | POA: Diagnosis not present

## 2022-04-18 DIAGNOSIS — D500 Iron deficiency anemia secondary to blood loss (chronic): Secondary | ICD-10-CM

## 2022-04-18 DIAGNOSIS — D696 Thrombocytopenia, unspecified: Secondary | ICD-10-CM

## 2022-04-18 DIAGNOSIS — D649 Anemia, unspecified: Secondary | ICD-10-CM

## 2022-04-18 LAB — CBC WITH DIFFERENTIAL (CANCER CENTER ONLY)
Abs Immature Granulocytes: 0.09 10*3/uL — ABNORMAL HIGH (ref 0.00–0.07)
Basophils Absolute: 0 10*3/uL (ref 0.0–0.1)
Basophils Relative: 1 %
Eosinophils Absolute: 0 10*3/uL (ref 0.0–0.5)
Eosinophils Relative: 0 %
HCT: 29.7 % — ABNORMAL LOW (ref 36.0–46.0)
Hemoglobin: 9.8 g/dL — ABNORMAL LOW (ref 12.0–15.0)
Immature Granulocytes: 2 %
Lymphocytes Relative: 56 %
Lymphs Abs: 2.8 10*3/uL (ref 0.7–4.0)
MCH: 28.5 pg (ref 26.0–34.0)
MCHC: 33 g/dL (ref 30.0–36.0)
MCV: 86.3 fL (ref 80.0–100.0)
Monocytes Absolute: 0.4 10*3/uL (ref 0.1–1.0)
Monocytes Relative: 8 %
Neutro Abs: 1.7 10*3/uL (ref 1.7–7.7)
Neutrophils Relative %: 33 %
Platelet Count: 55 10*3/uL — ABNORMAL LOW (ref 150–400)
RBC: 3.44 MIL/uL — ABNORMAL LOW (ref 3.87–5.11)
RDW: 20.1 % — ABNORMAL HIGH (ref 11.5–15.5)
WBC Count: 5 10*3/uL (ref 4.0–10.5)
nRBC: 1.2 % — ABNORMAL HIGH (ref 0.0–0.2)

## 2022-04-18 LAB — CMP (CANCER CENTER ONLY)
ALT: 390 U/L (ref 0–44)
AST: 182 U/L (ref 15–41)
Albumin: 3.4 g/dL — ABNORMAL LOW (ref 3.5–5.0)
Alkaline Phosphatase: 413 U/L — ABNORMAL HIGH (ref 38–126)
Anion gap: 7 (ref 5–15)
BUN: 8 mg/dL (ref 6–20)
CO2: 20 mmol/L — ABNORMAL LOW (ref 22–32)
Calcium: 8.1 mg/dL — ABNORMAL LOW (ref 8.9–10.3)
Chloride: 107 mmol/L (ref 98–111)
Creatinine: 0.48 mg/dL (ref 0.44–1.00)
GFR, Estimated: >60 mL/min (ref >60)
Glucose, Bld: 97 mg/dL (ref 70–99)
Potassium: 3.6 mmol/L (ref 3.5–5.1)
Sodium: 134 mmol/L — ABNORMAL LOW (ref 135–145)
Total Bilirubin: 1 mg/dL (ref 0.3–1.2)
Total Protein: 6 g/dL — ABNORMAL LOW (ref 6.5–8.1)

## 2022-04-18 LAB — SAMPLE TO BLOOD BANK

## 2022-04-18 LAB — PROTIME-INR
INR: 1.1 (ref 0.8–1.2)
Prothrombin Time: 14.4 seconds (ref 11.4–15.2)

## 2022-04-18 LAB — LACTATE DEHYDROGENASE: LDH: 400 U/L — ABNORMAL HIGH (ref 98–192)

## 2022-04-18 MED ORDER — HEPARIN SOD (PORK) LOCK FLUSH 100 UNIT/ML IV SOLN
500.0000 [IU] | Freq: Once | INTRAVENOUS | Status: AC
  Administered 2022-04-18: 500 [IU] via INTRAVENOUS

## 2022-04-18 MED ORDER — METHYLPREDNISOLONE SODIUM SUCC 125 MG IJ SOLR
125.0000 mg | Freq: Once | INTRAMUSCULAR | Status: AC
  Administered 2022-04-18: 125 mg via INTRAVENOUS
  Filled 2022-04-18: qty 2

## 2022-04-18 MED ORDER — METHYLPREDNISOLONE 4 MG PO TBPK: ORAL_TABLET | ORAL | 0 refills | Status: DC

## 2022-04-18 MED ORDER — SODIUM CHLORIDE 0.9 % IV SOLN: INTRAVENOUS | Status: DC

## 2022-04-18 MED ORDER — SODIUM CHLORIDE 0.9% FLUSH
10.0000 mL | Freq: Once | INTRAVENOUS | Status: AC
  Administered 2022-04-18: 10 mL via INTRAVENOUS

## 2022-04-18 NOTE — Progress Notes (Signed)
Labs reviewed with MD. RTC 3/28 for repeat labs.  Message to scheduling IVF given for Hydration. Pt c/o rash to head and back of neck. Reviewed with MD, 125mg  solu medrol IVP and medrol dose Pak called into pt pharmacy

## 2022-04-18 NOTE — Patient Instructions (Signed)

## 2022-04-19 LAB — CALPROTECTIN, FECAL: Calprotectin, Fecal: 34 ug/g (ref 0–120)

## 2022-04-21 ENCOUNTER — Inpatient Hospital Stay: Payer: Commercial Managed Care - PPO

## 2022-04-21 VITALS — BP 122/89 | HR 89 | Resp 17

## 2022-04-21 DIAGNOSIS — C50912 Malignant neoplasm of unspecified site of left female breast: Secondary | ICD-10-CM

## 2022-04-21 DIAGNOSIS — Z5111 Encounter for antineoplastic chemotherapy: Secondary | ICD-10-CM | POA: Diagnosis not present

## 2022-04-21 DIAGNOSIS — D649 Anemia, unspecified: Secondary | ICD-10-CM

## 2022-04-21 DIAGNOSIS — D500 Iron deficiency anemia secondary to blood loss (chronic): Secondary | ICD-10-CM

## 2022-04-21 LAB — CMP (CANCER CENTER ONLY)
ALT: 268 U/L — ABNORMAL HIGH (ref 0–44)
AST: 105 U/L — ABNORMAL HIGH (ref 15–41)
Albumin: 3.5 g/dL (ref 3.5–5.0)
Alkaline Phosphatase: 584 U/L — ABNORMAL HIGH (ref 38–126)
Anion gap: 7 (ref 5–15)
BUN: 8 mg/dL (ref 6–20)
CO2: 19 mmol/L — ABNORMAL LOW (ref 22–32)
Calcium: 8 mg/dL — ABNORMAL LOW (ref 8.9–10.3)
Chloride: 111 mmol/L (ref 98–111)
Creatinine: 0.39 mg/dL — ABNORMAL LOW (ref 0.44–1.00)
GFR, Estimated: >60 mL/min (ref >60)
Glucose, Bld: 127 mg/dL — ABNORMAL HIGH (ref 70–99)
Potassium: 3.8 mmol/L (ref 3.5–5.1)
Sodium: 137 mmol/L (ref 135–145)
Total Bilirubin: 1 mg/dL (ref 0.3–1.2)
Total Protein: 5.9 g/dL — ABNORMAL LOW (ref 6.5–8.1)

## 2022-04-21 LAB — CBC WITH DIFFERENTIAL (CANCER CENTER ONLY)
Abs Immature Granulocytes: 0.31 10*3/uL — ABNORMAL HIGH (ref 0.00–0.07)
Basophils Absolute: 0 10*3/uL (ref 0.0–0.1)
Basophils Relative: 1 %
Eosinophils Absolute: 0.1 10*3/uL (ref 0.0–0.5)
Eosinophils Relative: 2 %
HCT: 28 % — ABNORMAL LOW (ref 36.0–46.0)
Hemoglobin: 9.3 g/dL — ABNORMAL LOW (ref 12.0–15.0)
Immature Granulocytes: 5 %
Lymphocytes Relative: 48 %
Lymphs Abs: 3.4 10*3/uL (ref 0.7–4.0)
MCH: 29.6 pg (ref 26.0–34.0)
MCHC: 33.2 g/dL (ref 30.0–36.0)
MCV: 89.2 fL (ref 80.0–100.0)
Monocytes Absolute: 0.9 10*3/uL (ref 0.1–1.0)
Monocytes Relative: 14 %
Neutro Abs: 2.1 10*3/uL (ref 1.7–7.7)
Neutrophils Relative %: 30 %
Platelet Count: 117 10*3/uL — ABNORMAL LOW (ref 150–400)
RBC: 3.14 MIL/uL — ABNORMAL LOW (ref 3.87–5.11)
RDW: 22.4 % — ABNORMAL HIGH (ref 11.5–15.5)
WBC Count: 6.9 10*3/uL (ref 4.0–10.5)
nRBC: 4.8 % — ABNORMAL HIGH (ref 0.0–0.2)

## 2022-04-21 LAB — SAMPLE TO BLOOD BANK

## 2022-04-21 LAB — LACTATE DEHYDROGENASE: LDH: 343 U/L — ABNORMAL HIGH (ref 98–192)

## 2022-04-21 MED ORDER — SODIUM CHLORIDE 0.9 % IV SOLN: INTRAVENOUS | Status: DC

## 2022-04-21 NOTE — Patient Instructions (Signed)

## 2022-04-22 ENCOUNTER — Telehealth: Payer: Self-pay | Admitting: *Deleted

## 2022-04-22 ENCOUNTER — Other Ambulatory Visit: Payer: Self-pay

## 2022-04-22 ENCOUNTER — Telehealth: Payer: Self-pay | Admitting: Physician Assistant

## 2022-04-22 ENCOUNTER — Inpatient Hospital Stay: Payer: Commercial Managed Care - PPO

## 2022-04-22 VITALS — BP 116/84 | HR 84 | Temp 98.2°F | Resp 17

## 2022-04-22 DIAGNOSIS — Z5111 Encounter for antineoplastic chemotherapy: Secondary | ICD-10-CM | POA: Diagnosis not present

## 2022-04-22 DIAGNOSIS — C50912 Malignant neoplasm of unspecified site of left female breast: Secondary | ICD-10-CM

## 2022-04-22 MED ORDER — SODIUM CHLORIDE 0.9% FLUSH
10.0000 mL | Freq: Once | INTRAVENOUS | Status: AC
  Administered 2022-04-22: 10 mL via INTRAVENOUS

## 2022-04-22 MED ORDER — SODIUM CHLORIDE 0.9 % IV SOLN: INTRAVENOUS | Status: DC

## 2022-04-22 MED ORDER — LORAZEPAM 0.5 MG PO TABS: 0.5000 mg | ORAL_TABLET | Freq: Four times a day (QID) | ORAL | 0 refills | Status: DC | PRN

## 2022-04-22 MED ORDER — FAMOTIDINE IN NACL 20-0.9 MG/50ML-% IV SOLN
20.0000 mg | Freq: Once | INTRAVENOUS | Status: AC
  Administered 2022-04-22: 20 mg via INTRAVENOUS
  Filled 2022-04-22: qty 50

## 2022-04-22 MED ORDER — HEPARIN SOD (PORK) LOCK FLUSH 100 UNIT/ML IV SOLN
500.0000 [IU] | Freq: Once | INTRAVENOUS | Status: AC
  Administered 2022-04-22: 500 [IU] via INTRAVENOUS

## 2022-04-22 NOTE — Telephone Encounter (Signed)
Message received from patient requesting a call back regarding her c-diff medicine. Call placed back to patient and patient states that since Wednesday, she feels as though she has a knot in her stomach.  She states that her appetite has been decreased and that she is unable to keep down her Vancomycin capsules or steroid pills.  Dr. Marin Olp notified and order received for pt to come in today for anti-emetics and IVF's.  Grier notified of MD order and states that she can be here by 1:30PM today.  Message sent to scheduling.

## 2022-04-22 NOTE — Patient Instructions (Signed)

## 2022-04-22 NOTE — Telephone Encounter (Signed)
Called this morning, she is currently on vancomycin for C. difficile.  She had an episode of vomiting yesterday, and says she has a knot sensation in her stomach that she feels is a heaviness secondary to her chemo medication and taking the vancomycin  He is concerned about ability to keep the medications down, and also had questions about diet  She does have Zofran at home but had not been taking this.  Advise she start Zofran 4 mg every 6-8 hours and take it regularly over the next few days then if she is feeling better she can gradually reduce to as needed Hopefully this will help her finish the course of vancomycin  We also discussed full liquid diet very bland over the next couple of days and then gradually advance as she tolerates, pushing fluids though not drinking large quantities at 1 time.  She knows to call back if she has any further issues

## 2022-04-25 ENCOUNTER — Inpatient Hospital Stay: Payer: Commercial Managed Care - PPO

## 2022-04-25 ENCOUNTER — Inpatient Hospital Stay: Payer: Commercial Managed Care - PPO | Admitting: Hematology & Oncology

## 2022-04-25 ENCOUNTER — Other Ambulatory Visit: Payer: Self-pay

## 2022-04-25 ENCOUNTER — Encounter: Payer: Self-pay | Admitting: Hematology & Oncology

## 2022-04-25 ENCOUNTER — Inpatient Hospital Stay: Payer: Commercial Managed Care - PPO | Attending: Family

## 2022-04-25 VITALS — BP 119/72 | HR 99 | Temp 99.1°F | Resp 18 | Ht 63.0 in | Wt 114.0 lb

## 2022-04-25 DIAGNOSIS — C50912 Malignant neoplasm of unspecified site of left female breast: Secondary | ICD-10-CM

## 2022-04-25 DIAGNOSIS — C7949 Secondary malignant neoplasm of other parts of nervous system: Secondary | ICD-10-CM | POA: Diagnosis not present

## 2022-04-25 DIAGNOSIS — D500 Iron deficiency anemia secondary to blood loss (chronic): Secondary | ICD-10-CM

## 2022-04-25 DIAGNOSIS — R1110 Vomiting, unspecified: Secondary | ICD-10-CM | POA: Diagnosis not present

## 2022-04-25 DIAGNOSIS — D649 Anemia, unspecified: Secondary | ICD-10-CM

## 2022-04-25 LAB — CMP (CANCER CENTER ONLY)
ALT: 206 U/L — ABNORMAL HIGH (ref 0–44)
AST: 115 U/L — ABNORMAL HIGH (ref 15–41)
Albumin: 3.3 g/dL — ABNORMAL LOW (ref 3.5–5.0)
Alkaline Phosphatase: 804 U/L — ABNORMAL HIGH (ref 38–126)
Anion gap: 8 (ref 5–15)
BUN: 5 mg/dL — ABNORMAL LOW (ref 6–20)
CO2: 18 mmol/L — ABNORMAL LOW (ref 22–32)
Calcium: 7.9 mg/dL — ABNORMAL LOW (ref 8.9–10.3)
Chloride: 111 mmol/L (ref 98–111)
Creatinine: 0.38 mg/dL — ABNORMAL LOW (ref 0.44–1.00)
GFR, Estimated: >60 mL/min (ref >60)
Glucose, Bld: 144 mg/dL — ABNORMAL HIGH (ref 70–99)
Potassium: 3.6 mmol/L (ref 3.5–5.1)
Sodium: 137 mmol/L (ref 135–145)
Total Bilirubin: 0.8 mg/dL (ref 0.3–1.2)
Total Protein: 5.5 g/dL — ABNORMAL LOW (ref 6.5–8.1)

## 2022-04-25 LAB — CBC WITH DIFFERENTIAL (CANCER CENTER ONLY)
Abs Immature Granulocytes: 0.41 10*3/uL — ABNORMAL HIGH (ref 0.00–0.07)
Basophils Absolute: 0.1 10*3/uL (ref 0.0–0.1)
Basophils Relative: 1 %
Eosinophils Absolute: 0 10*3/uL (ref 0.0–0.5)
Eosinophils Relative: 1 %
HCT: 27.8 % — ABNORMAL LOW (ref 36.0–46.0)
Hemoglobin: 9 g/dL — ABNORMAL LOW (ref 12.0–15.0)
Immature Granulocytes: 7 %
Lymphocytes Relative: 49 %
Lymphs Abs: 3.1 10*3/uL (ref 0.7–4.0)
MCH: 29.6 pg (ref 26.0–34.0)
MCHC: 32.4 g/dL (ref 30.0–36.0)
MCV: 91.4 fL (ref 80.0–100.0)
Monocytes Absolute: 0.5 10*3/uL (ref 0.1–1.0)
Monocytes Relative: 8 %
Neutro Abs: 2.1 10*3/uL (ref 1.7–7.7)
Neutrophils Relative %: 34 %
Platelet Count: 186 10*3/uL (ref 150–400)
RBC: 3.04 MIL/uL — ABNORMAL LOW (ref 3.87–5.11)
RDW: 24.2 % — ABNORMAL HIGH (ref 11.5–15.5)
WBC Count: 6.2 10*3/uL (ref 4.0–10.5)
nRBC: 2.6 % — ABNORMAL HIGH (ref 0.0–0.2)

## 2022-04-25 LAB — FERRITIN: Ferritin: 1198 ng/mL — ABNORMAL HIGH (ref 11–307)

## 2022-04-25 LAB — IRON AND IRON BINDING CAPACITY (CC-WL,HP ONLY)
Iron: 126 ug/dL (ref 28–170)
Saturation Ratios: 56 % — ABNORMAL HIGH (ref 10.4–31.8)
TIBC: 224 ug/dL — ABNORMAL LOW (ref 250–450)
UIBC: 98 ug/dL — ABNORMAL LOW (ref 148–442)

## 2022-04-25 LAB — LACTATE DEHYDROGENASE: LDH: 365 U/L — ABNORMAL HIGH (ref 98–192)

## 2022-04-25 MED ORDER — SODIUM CHLORIDE 0.9 % IV SOLN
90.0000 mg/m2 | Freq: Once | INTRAVENOUS | Status: AC
  Administered 2022-04-25: 144 mg via INTRAVENOUS
  Filled 2022-04-25: qty 24

## 2022-04-25 MED ORDER — PALONOSETRON HCL INJECTION 0.25 MG/5ML
0.2500 mg | Freq: Once | INTRAVENOUS | Status: AC
  Administered 2022-04-25: 0.25 mg via INTRAVENOUS
  Filled 2022-04-25: qty 5

## 2022-04-25 MED ORDER — SODIUM CHLORIDE 0.9% FLUSH
10.0000 mL | INTRAVENOUS | Status: DC | PRN
  Administered 2022-04-25: 10 mL

## 2022-04-25 MED ORDER — DIPHENHYDRAMINE HCL 50 MG/ML IJ SOLN
50.0000 mg | Freq: Once | INTRAMUSCULAR | Status: AC
  Administered 2022-04-25: 50 mg via INTRAVENOUS
  Filled 2022-04-25: qty 1

## 2022-04-25 MED ORDER — HEPARIN SOD (PORK) LOCK FLUSH 100 UNIT/ML IV SOLN
500.0000 [IU] | Freq: Once | INTRAVENOUS | Status: AC | PRN
  Administered 2022-04-25: 500 [IU]

## 2022-04-25 MED ORDER — FAMOTIDINE IN NACL 20-0.9 MG/50ML-% IV SOLN
20.0000 mg | Freq: Once | INTRAVENOUS | Status: AC
  Administered 2022-04-25: 20 mg via INTRAVENOUS
  Filled 2022-04-25: qty 50

## 2022-04-25 MED ORDER — SODIUM CHLORIDE 0.9 % IV SOLN
150.0000 mg | Freq: Once | INTRAVENOUS | Status: AC
  Administered 2022-04-25: 150 mg via INTRAVENOUS
  Filled 2022-04-25: qty 150

## 2022-04-25 MED ORDER — SODIUM CHLORIDE 0.9 % IV SOLN
10.0000 mg | Freq: Once | INTRAVENOUS | Status: AC
  Administered 2022-04-25: 10 mg via INTRAVENOUS
  Filled 2022-04-25: qty 10

## 2022-04-25 MED ORDER — SODIUM CHLORIDE 0.9 % IV SOLN
2.0000 g | Freq: Once | INTRAVENOUS | Status: AC
  Administered 2022-04-25: 2 g via INTRAVENOUS
  Filled 2022-04-25: qty 20

## 2022-04-25 MED ORDER — SODIUM CHLORIDE 0.9 % IV SOLN: Freq: Once | INTRAVENOUS | Status: AC

## 2022-04-25 MED ORDER — SODIUM CHLORIDE 0.9 % IV SOLN
800.0000 mg/m2 | Freq: Once | INTRAVENOUS | Status: AC
  Administered 2022-04-25: 1254 mg via INTRAVENOUS
  Filled 2022-04-25: qty 25.98

## 2022-04-25 NOTE — Progress Notes (Signed)
Hematology and Oncology Follow Up Visit  Carla Clark [id number] 1985/12/04 36 y.o. 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  As always, it is fun talking to her.  We actually saw her this past Friday when she was having some problems with nausea and vomiting.  She felt a lot better after getting IV fluids.  She had a wonderful Easter weekend with her family.  She is dealing with C. difficile.  She is being followed by her gastroenterologist for this.  She had MRI of the abdomen on 04/15/2022.  There is no liver metastasis that would explain her elevated LFTs.  This might be from the Crohn's.  Also might be from the C. difficile.  She is on some oral vancomycin.  Thankfully, her platelet count is coming up quite nicely.  Hopefully, this is a indicator that treatment is working.  We did add gemcitabine to the Taxol.  Hopefully, we will see a synergistic response.  She has had no problems with temperatures.  She has had no bleeding.  She has had no urinary issues.  The skin is quite dry.  She has had no problems with pain.  Her last CA 27.29 was down to 3900.  Hopefully, we will see a lower level today.  It looks like the actual primary in the left breast is also responding.  The subcutaneous nodule in the upper outer quadrant is also flattened out.  Currently, I would say that her performance status is probably ECOG 1.    Medications:  Allergies as of 04/25/2022       Reactions   Other Other (See Comments)   History of Crohn's disease- NO seeds, tomatoes, anything that doesn't digest well, etc.....        Medication List        Accurate as of April 25, 2022 11:07 AM. If  you have any questions, ask your nurse or doctor.          STOP taking these medications    SKYRIZI Ely Stopped by: Volanda Napoleon, MD       TAKE these medications    cefdinir 300 MG capsule Commonly known as: OMNICEF Take 2 capsules (600 mg total) by mouth daily.   cyanocobalamin 100 MCG tablet Commonly known as: VITAMIN B12 Take 100 mcg by mouth daily.   docusate sodium 100 MG capsule Commonly known as: COLACE Take 1 capsule (100 mg total) by mouth 2 (two) times daily.   fluconazole 100 MG tablet Commonly known as: DIFLUCAN Take 1 tablet (100 mg total) by mouth daily.   hydrocortisone 2.5 % rectal cream Commonly known as: ANUSOL-HC Place 1 Application rectally 2 (two) times daily.   lidocaine-prilocaine cream Commonly known as: EMLA Apply 1 Application topically See admin instructions. Apply a dime-size of cream to port-a-cath 1-2 hours prior to access. Cover with [employer].   LORazepam 0.5 MG tablet Commonly known as: ATIVAN Take 1 tablet (0.5 mg total) by mouth every 6 (six) hours as needed for anxiety.   melatonin 3 MG Tabs tablet Take 3 mg by mouth at bedtime as needed.   methylPREDNISolone 4 MG Tbpk  tablet Commonly known as: MEDROL DOSEPAK Take as directed   ondansetron 8 MG tablet Commonly known as: Zofran Take 1 tablet (8 mg total) by mouth every 8 (eight) hours as needed for nausea or vomiting.   pantoprazole 40 MG tablet Commonly known as: PROTONIX Take 1 tablet (40 mg total) by mouth 2 (two) times daily.   polyethylene glycol 17 g packet Commonly known as: MIRALAX / GLYCOLAX Take 17 g by mouth daily as needed for mild constipation.   predniSONE 10 MG tablet Commonly known as: DELTASONE Take 8 tablets (80 mg total) by mouth daily with breakfast.   prochlorperazine 10 MG tablet Commonly known as: COMPAZINE Take 1 tablet (10 mg total) by mouth every 6 (six) hours as needed for nausea or vomiting.   pyridoxine 250 MG tablet Commonly  known as: Neuro-K-250 Vitamin B6 Take 2 tablets (500 mg total) by mouth daily.   traMADol 50 MG tablet Commonly known as: ULTRAM Take 50 mg by mouth every 6 (six) hours as needed (for pain).   vancomycin 125 MG capsule Commonly known as: VANCOCIN Take 1 capsule (125 mg total) by mouth 4 (four) times daily.        Allergies:  Allergies  Allergen Reactions   Other Other (See Comments)    History of Crohn's disease- NO seeds, tomatoes, anything that doesn't digest well, etc.....    Past Medical History, Surgical history, Social history, and Family History were reviewed and updated.  Review of Systems: Review of Systems  Constitutional: Negative.   HENT: Negative.    Eyes: Negative.   Respiratory: Negative.    Cardiovascular: Negative.   Gastrointestinal:  Positive for abdominal pain and diarrhea.  Genitourinary: Negative.   Musculoskeletal: Negative.   Skin: Negative.   Neurological: Negative.   Endo/Heme/Allergies: Negative.   Psychiatric/Behavioral: Negative.     Marland Kitchen   Physical Exam:  height is 5\' 3"  (1.6 m) and weight is 114 lb (51.7 kg). Her oral temperature is 99.1 F (37.3 C). Her blood pressure is 119/72 and her pulse is 99. Her respiration is 18 and oxygen saturation is 100%.   Wt Readings from Last 3 Encounters:  04/25/22 114 lb (51.7 kg)  04/11/22 115 lb (52.2 kg)  04/04/22 119 lb (54 kg)    Physical Exam Vitals reviewed.  Constitutional:      Comments: On her left breast, there is a flattened area at about the 2 o'clock position.  This is where there was a nodule.  I cannot palpate any adenopathy.  At the actual left breast mass also appears to be smaller.  HENT:     Head: Normocephalic and atraumatic.  Eyes:     Pupils: Pupils are equal, round, and reactive to light.  Cardiovascular:     Rate and Rhythm: Normal rate and regular rhythm.     Heart sounds: Normal heart sounds.  Pulmonary:     Effort: Pulmonary effort is normal.     Breath sounds:  Normal breath sounds.  Abdominal:     General: Bowel sounds are normal.     Palpations: Abdomen is soft.  Musculoskeletal:        General: No tenderness or deformity. Normal range of motion.     Cervical back: Normal range of motion.  Lymphadenopathy:     Cervical: No cervical adenopathy.  Skin:    General: Skin is warm and dry.     Findings: No erythema or rash.  Neurological:     Mental Status: She is  alert and oriented to person, place, and time.  Psychiatric:        Behavior: Behavior normal.        Thought Content: Thought content normal.        Judgment: Judgment normal.      Lab Results  Component Value Date   WBC 6.2 04/25/2022   HGB 9.0 (L) 04/25/2022   HCT 27.8 (L) 04/25/2022   MCV 91.4 04/25/2022   PLT 186 04/25/2022   Lab Results  Component Value Date   FERRITIN 1,147 (H) 03/28/2022   IRON 244 (H) 03/28/2022   TIBC 251 03/28/2022   UIBC 7 (L) 03/28/2022   IRONPCTSAT 97 (H) 03/28/2022   Lab Results  Component Value Date   RETICCTPCT 0.7 08/24/2021   RBC 3.04 (L) 04/25/2022   RETICCTABS 13.7 (L) 01/20/2015   No results found for: "KPAFRELGTCHN", "LAMBDASER", "KAPLAMBRATIO" Lab Results  Component Value Date   IGA 187 03/11/2016   No results found for: "TOTALPROTELP", "ALBUMINELP", "A1GS", "A2GS", "BETS", "BETA2SER", "GAMS", "MSPIKE", "SPEI"   Chemistry      Component Value Date/Time   NA 137 04/21/2022 0924   K 3.8 04/21/2022 0924   CL 111 04/21/2022 0924   CO2 19 (L) 04/21/2022 0924   BUN 8 04/21/2022 0924   CREATININE 0.39 (L) 04/21/2022 0924   CREATININE 0.62 08/20/2012 1139      Component Value Date/Time   CALCIUM 8.0 (L) 04/21/2022 0924   ALKPHOS 584 (H) 04/21/2022 0924   AST 105 (H) 04/21/2022 0924   ALT 268 (H) 04/21/2022 0924   BILITOT 1.0 04/21/2022 0924       Impression and Plan: Ms. Picciotto is a very charming 37 yo premenopausal white female with triple negative metastatic breast cancer, BRCA positive.   It will be  interesting to see what the CA 27.29 is.  Hopefully, we are seeing a response.  We are going to have to stop the pembrolizumab.  I am not sure if this might be causing the LFTs to be elevated.  Hopefully, adding the gemcitabine will give [place] a synergistic response.  I am just happy that her quality of life is actually doing fairly well right now.  We will continue her on weekly treatment.  Will plan to see her back on 429 when she starts her fourth cycle of treatment.   Volanda Napoleon, MD 4/1/202411:07 AM

## 2022-04-25 NOTE — Patient Instructions (Signed)
[facility]  Discharge Instructions: Thank you for choosing [facility] to provide your oncology and hematology care.   If you have a lab appointment with the [facility], please go directly to the [facility] and check in at the registration area.  Wear comfortable clothing and clothing appropriate for easy access to any Portacath or PICC line.   We strive to give you quality time with your provider. You may need to reschedule your appointment if you arrive late (15 or more minutes).  Arriving late affects you and other patients whose appointments are after yours.  Also, if you miss three or more appointments without notifying the office, you may be dismissed from the clinic at the provider's discretion.      For prescription refill requests, have your pharmacy contact our office and allow 72 hours for refills to be completed.    Today you received the following chemotherapy and/or immunotherapy agents Taxol,Gemzar      To help prevent nausea and vomiting after your treatment, we encourage you to take your nausea medication as directed.  BELOW ARE SYMPTOMS THAT SHOULD BE REPORTED IMMEDIATELY: *FEVER GREATER THAN 100.4 F (38 C) OR HIGHER *CHILLS OR SWEATING *NAUSEA AND VOMITING THAT IS NOT CONTROLLED WITH YOUR NAUSEA MEDICATION *UNUSUAL SHORTNESS OF BREATH *UNUSUAL BRUISING OR BLEEDING *URINARY PROBLEMS (pain or burning when urinating, or frequent urination) *BOWEL PROBLEMS (unusual diarrhea, constipation, pain near the anus) TENDERNESS IN MOUTH AND THROAT WITH OR WITHOUT PRESENCE OF ULCERS (sore throat, sores in mouth, or a toothache) UNUSUAL RASH, SWELLING OR PAIN  UNUSUAL VAGINAL DISCHARGE OR ITCHING   Items with * indicate a potential emergency and should be followed up as soon as possible or go to the Emergency Department if any problems should occur.  Please show the CHEMOTHERAPY ALERT CARD or IMMUNOTHERAPY ALERT CARD at  check-in to the Emergency Department and triage nurse. Should you have questions after your visit or need to cancel or reschedule your appointment, please contact [facility]  [phone] and follow the prompts.  Office hours are 8:00 a.m. to 4:30 p.m. Monday - Friday. Please note that voicemails left after 4:00 p.m. may not be returned until the following business day.  We are closed weekends and major holidays. You have access to a nurse at all times for urgent questions. Please call the main number to the clinic [phone] and follow the prompts.  For any non-urgent questions, you may also contact your provider using MyChart. We now offer e-Visits for anyone 72 and older to request care online for non-urgent symptoms. For details visit [email or web address].   Also download the MyChart app! Go to the app store, search "MyChart", open the app, select [facility], and log in with your MyChart username and password.

## 2022-04-25 NOTE — Progress Notes (Signed)
Reviewed all bloodwork with Dr Marin Olp.  Ok to treat per Dr Marin Olp.

## 2022-04-26 ENCOUNTER — Emergency Department (HOSPITAL_COMMUNITY)
Admission: EM | Admit: 2022-04-26 | Discharge: 2022-04-26 | Disposition: A | Discharge status: Home / Self Care | Payer: Commercial Managed Care - PPO | Source: Home / Self Care | Attending: Emergency Medicine | Admitting: Emergency Medicine

## 2022-04-26 ENCOUNTER — Encounter (HOSPITAL_COMMUNITY): Payer: Self-pay

## 2022-04-26 ENCOUNTER — Ambulatory Visit (INDEPENDENT_AMBULATORY_CARE_PROVIDER_SITE_OTHER): Payer: Commercial Managed Care - PPO | Admitting: Psychologist

## 2022-04-26 ENCOUNTER — Emergency Department (HOSPITAL_COMMUNITY): Payer: Commercial Managed Care - PPO

## 2022-04-26 ENCOUNTER — Encounter: Payer: Self-pay | Admitting: *Deleted

## 2022-04-26 ENCOUNTER — Other Ambulatory Visit: Payer: Self-pay

## 2022-04-26 ENCOUNTER — Encounter: Payer: Self-pay | Admitting: Family

## 2022-04-26 ENCOUNTER — Encounter: Payer: Self-pay | Admitting: Hematology & Oncology

## 2022-04-26 DIAGNOSIS — Z853 Personal history of malignant neoplasm of breast: Secondary | ICD-10-CM | POA: Insufficient documentation

## 2022-04-26 DIAGNOSIS — R234 Changes in skin texture: Secondary | ICD-10-CM | POA: Insufficient documentation

## 2022-04-26 DIAGNOSIS — R7401 Elevation of levels of liver transaminase levels: Secondary | ICD-10-CM | POA: Insufficient documentation

## 2022-04-26 DIAGNOSIS — R519 Headache, unspecified: Secondary | ICD-10-CM | POA: Insufficient documentation

## 2022-04-26 DIAGNOSIS — F320 Major depressive disorder, single episode, mild: Secondary | ICD-10-CM

## 2022-04-26 DIAGNOSIS — Z634 Disappearance and death of family member: Secondary | ICD-10-CM

## 2022-04-26 DIAGNOSIS — R42 Dizziness and giddiness: Secondary | ICD-10-CM | POA: Insufficient documentation

## 2022-04-26 DIAGNOSIS — R040 Epistaxis: Secondary | ICD-10-CM | POA: Insufficient documentation

## 2022-04-26 LAB — PROTIME-INR
INR: 1.2 (ref 0.8–1.2)
Prothrombin Time: 14.9 seconds (ref 11.4–15.2)

## 2022-04-26 LAB — CBC WITH DIFFERENTIAL/PLATELET
Abs Immature Granulocytes: 0.26 10*3/uL — ABNORMAL HIGH (ref 0.00–0.07)
Basophils Absolute: 0 10*3/uL (ref 0.0–0.1)
Basophils Relative: 0 %
Eosinophils Absolute: 0 10*3/uL (ref 0.0–0.5)
Eosinophils Relative: 0 %
HCT: 26.7 % — ABNORMAL LOW (ref 36.0–46.0)
Hemoglobin: 8.4 g/dL — ABNORMAL LOW (ref 12.0–15.0)
Immature Granulocytes: 4 %
Lymphocytes Relative: 18 %
Lymphs Abs: 1.3 10*3/uL (ref 0.7–4.0)
MCH: 29.2 pg (ref 26.0–34.0)
MCHC: 31.5 g/dL (ref 30.0–36.0)
MCV: 92.7 fL (ref 80.0–100.0)
Monocytes Absolute: 0.5 10*3/uL (ref 0.1–1.0)
Monocytes Relative: 7 %
Neutro Abs: 5.2 10*3/uL (ref 1.7–7.7)
Neutrophils Relative %: 71 %
Platelets: 186 10*3/uL (ref 150–400)
RBC: 2.88 MIL/uL — ABNORMAL LOW (ref 3.87–5.11)
RDW: 24.3 % — ABNORMAL HIGH (ref 11.5–15.5)
WBC: 7.3 10*3/uL (ref 4.0–10.5)
nRBC: 0.8 % — ABNORMAL HIGH (ref 0.0–0.2)

## 2022-04-26 LAB — COMPREHENSIVE METABOLIC PANEL
ALT: 274 U/L — ABNORMAL HIGH (ref 0–44)
AST: 227 U/L — ABNORMAL HIGH (ref 15–41)
Albumin: 3 g/dL — ABNORMAL LOW (ref 3.5–5.0)
Alkaline Phosphatase: 801 U/L — ABNORMAL HIGH (ref 38–126)
Anion gap: 5 (ref 5–15)
BUN: 10 mg/dL (ref 6–20)
CO2: 19 mmol/L — ABNORMAL LOW (ref 22–32)
Calcium: 7.8 mg/dL — ABNORMAL LOW (ref 8.9–10.3)
Chloride: 107 mmol/L (ref 98–111)
Creatinine, Ser: 0.39 mg/dL — ABNORMAL LOW (ref 0.44–1.00)
GFR, Estimated: >60 mL/min (ref >60)
Glucose, Bld: 111 mg/dL — ABNORMAL HIGH (ref 70–99)
Potassium: 3.7 mmol/L (ref 3.5–5.1)
Sodium: 131 mmol/L — ABNORMAL LOW (ref 135–145)
Total Bilirubin: 1.4 mg/dL — ABNORMAL HIGH (ref 0.3–1.2)
Total Protein: 5.8 g/dL — ABNORMAL LOW (ref 6.5–8.1)

## 2022-04-26 MED ORDER — PROCHLORPERAZINE EDISYLATE 10 MG/2ML IJ SOLN
10.0000 mg | Freq: Once | INTRAMUSCULAR | Status: AC
  Administered 2022-04-26: 10 mg via INTRAVENOUS
  Filled 2022-04-26: qty 2

## 2022-04-26 MED ORDER — HEPARIN SOD (PORK) LOCK FLUSH 100 UNIT/ML IV SOLN
500.0000 [IU] | Freq: Once | INTRAVENOUS | Status: AC
  Administered 2022-04-26: 500 [IU]
  Filled 2022-04-26: qty 5

## 2022-04-26 MED ORDER — DIPHENHYDRAMINE HCL 50 MG/ML IJ SOLN
25.0000 mg | Freq: Once | INTRAMUSCULAR | Status: AC
  Administered 2022-04-26: 25 mg via INTRAVENOUS
  Filled 2022-04-26: qty 1

## 2022-04-26 MED ORDER — ACETAMINOPHEN 325 MG PO TABS
650.0000 mg | ORAL_TABLET | Freq: Once | ORAL | Status: AC
  Administered 2022-04-26: 650 mg via ORAL
  Filled 2022-04-26: qty 2

## 2022-04-26 MED ORDER — LACTATED RINGERS IV BOLUS
1000.0000 mL | Freq: Once | INTRAVENOUS | Status: AC
  Administered 2022-04-26: 1000 mL via INTRAVENOUS

## 2022-04-26 NOTE — ED Provider Notes (Signed)
I saw and evaluated the patient, reviewed the resident's note and I agree with the findings and plan.   37 year old old female presents with headache and nosebleed.  History of stage IV breast cancer.  Her headache is bitemporal in nature.  Has known small subdural.  Will repeat CT of the head.  Also history of thrombocytopenia will check CBC.  Will give migraine cocktail.   Lacretia Leigh, MD 04/26/22 2048

## 2022-04-26 NOTE — Progress Notes (Signed)
Patient continues on treatment, however immunotherapy has been discontinued due to continued affect of LFTs.   Oncology Nurse Navigator Documentation     04/26/2022    8:30 AM  Oncology Nurse Navigator Flowsheets  Navigator Follow Up Date: 05/02/2022  Navigator Follow Up Reason: Follow-up Appointment;Chemotherapy  Navigator Location [facility]  Navigator Encounter Type Appt/Treatment Plan Review  Patient Visit Type MedOnc  Treatment Phase Active Tx  Barriers/Navigation Needs No Barriers At This Time  Interventions None Required  Acuity Level 1-No Barriers  Support Groups/Services Friends and Family  Time Spent with Patient 15

## 2022-04-26 NOTE — Progress Notes (Signed)
[facility] Counselor/Therapist Progress Note  Patient ID: Carla Clark, MRN: [id number],    Date: 04/26/2022  Time Spent: 12:01 pm to 12:45 pm; total time: 44 minutes   This session was held via video webex teletherapy due to the coronavirus risk at this time. The patient consented to video teletherapy and was located at her home during this session. She is aware it is the responsibility of the patient to secure confidentiality on her end of the session. The provider was in a private home office for the duration of this session. Limits of confidentiality were discussed with the patient.   Treatment Type: Individual Therapy  Reported Symptoms: Denied distress  Mental Status Exam: Appearance:  Well Groomed     Behavior: Appropriate  Motor: Normal  Speech/Language:  Clear and Coherent  Affect: Appropriate  Mood: normal  Thought process: normal  Thought content:   WNL  Sensory/Perceptual disturbances:   WNL  Orientation: oriented to person, place, and time/date  Attention: Good  Concentration: Good  Memory: WNL  Fund of knowledge:  Good  Insight:   Good  Judgment:  Good  Impulse Control: Good   Risk Assessment: Danger to Self:  No Self-injurious Behavior: No Danger to Others: No Duty to Warn:no Physical Aggression / Violence:No  Access to Firearms a concern: No  Gang Involvement:No   Subjective: Beginning the session, patient described herself as doing well while reflecting on events from the weekend. From there, she spent the session reflecting on her therapy journey. As part of this, she reflected on what she learned about herself, and how the therapeutic relationship assisted her. She indicated that she would like to continue working with the next therapist regarding her journey with cancer, parenting, returning to work, and family dynamics. She processed the different thoughts and emotions she was experiencing. She denied suicidal and homicidal ideation.    Interventions:  Worked on developing a therapeutic relationship with the patient using active listening and reflective statements. Provided emotional support using empathy and validation. Reviewed the treatment plan with the patient. Reviewed events from the weekend. Praised the patient for doing well. Processed thoughts and emotions. Reflected on patient's experience in counseling. Reflected on the different topics discussed as part of therapy. Processed how the therapeutic relationship assisted the patient. Identified topics for patient to consider discussing with future therapist. Provided psychoeducation about palliative care medicine. Processed thoughts and emotions. Provided psychoeducation about future counseling opportunities. Provided empathic statements. Assessed for suicidal and homicidal ideation.   Homework: NA  Next Session: NA. Patient will transition to new therapist as this clinician leaves the healthcare system.   Diagnosis: F32.0 major depressive affective disorder, single episode, mild and [id number] uncomplicated bereavement.   Plan:  Goals Alleviate depressive symptoms Recognize, accept, and cope with depressive feelings Develop healthy thinking patterns Develop healthy interpersonal relationships Begin a healthy grieving process  Objectives target date for all objectives is 07/01/2022 Cooperate with a medication evaluation by a physician Verbalize an accurate understanding of depression Verbalize an understanding of the treatment Identify and replace thoughts that support depression Learn and implement behavioral strategies Verbalize an understanding and resolution of current interpersonal problems Learn and implement problem solving and decision making skills Learn and implement conflict resolution skills to resolve interpersonal problems Verbalize an understanding of healthy and unhealthy emotions verbalize insight into how past relationships may be influence current  experiences with depression Use mindfulness and acceptance strategies and increase value based behavior  Increase hopeful statements  about the future.  Tell in detail the story of the current loss that is triggering symptoms Read books on the topic of grief Watch videos on the theme of grief Begin verbalizing feelings associated with the loss Attend a grief support group express thoughts and feelings about the deceased Identify and voice positives about the deceased implement acts of spiritual faith  Interventions Consistent with treatment model, discuss how change in cognitive, behavioral, and interpersonal can help client alleviate depression CBT Behavioral activation help the client explore the relationship, nature of the dispute,  Help the client develop new interpersonal skills and relationships Conduct Problem so living therapy Teach conflict resolution skills Use a process-experiential approach Conduct TLDP Conduct ACT Evaluate need for psychotropic medication Monitor adherence to medication  create a safe environment and actively build trust use empathy, compassion, and support ask the patient to write a letter to the lost person conduct empty chair ask the patient to discuss and list the positives and negative aspects of the person encourage patient to rely upon his/her spiritual faith  ask client to read books on grief ask patient to watch videos about grief assist patient in identifying emotions  ask patient to attend support group   The patient and clinician reviewed the treatment plan on 07/23/2021. The patient approved of the treatment plan.     Conception Chancy, PsyD

## 2022-04-26 NOTE — Discharge Instructions (Addendum)
Take 1 extra strength Tylenol up to every 6 hours as needed for headache up to 3 times a day.  You can also use ibuprofen sparsely, but I would not take more than 600 mg 3 times a day.  If you have new numbness or weakness, facial droop, slurred speech, or severe worsening pain please come back to the ED to be reevaluated.  I will follow-up with Dr. Marin Olp about getting you the MRI in the outpatient setting.

## 2022-04-26 NOTE — ED Provider Notes (Signed)
[facility] Provider Note   CSN: [id number] Arrival date & time: 04/26/22  1935     History  Chief Complaint  Patient presents with   Headache    Carla Clark is a 37 y.o. female with PMH Crohn's status post hemicolectomy, recent C. difficile diagnosis, metastatic breast cancer currently getting chemo with Dr. Marin Olp presenting to the ED for headache and epistaxis.  She says her headache started approximately 3 to 4 days ago and mostly affects the right side of her head radiating down to the right side of her neck.  She endorses possible photosensitivity and some nausea though she has not had any vomiting.  She does not usually get headaches and has no history of migraines.  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  She does not have any new neurological numbness or weakness, slurred speech, facial droop, though she did have a little bit of lightheadedness earlier today.  She epistaxis is a chronic recurring problem for her.  She previously had thrombocytopenia with platelets as low as 25 which has improved with initiation of her chemo treatment.  She had epistaxis earlier today for approximately 15 minutes which resolved after she held tight pressure.  She previously has had mucus in her stool with her C. difficile infection but no melena or bright red blood per rectum.  No hematemesis or excessive vaginal bleeding.  Home Medications Prior to Admission medications   Medication Sig Start Date End Date Taking? Authorizing Provider  cefdinir (OMNICEF) 300 MG capsule Take 2 capsules (600 mg total) by mouth daily. 03/28/22   Celso Amy, NP  docusate sodium (COLACE) 100 MG capsule Take 1 capsule (100 mg total) by mouth 2 (two) times daily. 03/11/22   Swayze, Ava, DO  fluconazole  (DIFLUCAN) 100 MG tablet Take 1 tablet (100 mg total) by mouth daily. 03/28/22   Celso Amy, NP  hydrocortisone (ANUSOL-HC) 2.5 % rectal cream Place 1 Application rectally 2 (two) times daily. 11/10/21   Pyrtle, Lajuan Lines, MD  lidocaine-prilocaine (EMLA) cream Apply 1 Application topically See admin instructions. Apply a dime-size of cream to port-a-cath 1-2 hours prior to access. Cover with [employer]. 02/14/22   [provider]  LORazepam (ATIVAN) 0.5 MG tablet Take 1 tablet (0.5 mg total) by mouth every 6 (six) hours as needed for anxiety. 04/22/22 05/22/22  Volanda Napoleon, MD  melatonin 3 MG TABS tablet Take 3 mg by mouth at bedtime as needed.    [provider]  methylPREDNISolone (MEDROL DOSEPAK) 4 MG TBPK tablet Take as directed 04/18/22   Volanda Napoleon, MD  ondansetron (ZOFRAN) 8 MG tablet Take 1 tablet (8 mg total) by mouth every 8 (eight) hours as needed for nausea or vomiting. 03/01/22   Volanda Napoleon, MD  pantoprazole (PROTONIX) 40 MG tablet Take 1 tablet (40 mg total) by mouth 2 (two) times daily. 03/28/22   Celso Amy, NP  polyethylene glycol (MIRALAX / GLYCOLAX) 17 g packet Take 17 g by mouth daily as needed for mild constipation. 03/11/22   Swayze, Ava, DO  predniSONE (DELTASONE) 10 MG tablet Take 8 tablets (80 mg total) by mouth daily with breakfast. 03/28/22   Celso Amy, NP  prochlorperazine (COMPAZINE) 10 MG tablet Take 1 tablet (10  mg total) by mouth every 6 (six) hours as needed for nausea or vomiting. 03/01/22   Volanda Napoleon, MD  pyridoxine (NEURO-K-250 VITAMIN B6) 250 MG tablet Take 2 tablets (500 mg total) by mouth daily. 02/24/22   Volanda Napoleon, MD  traMADol (ULTRAM) 50 MG tablet Take 50 mg by mouth every 6 (six) hours as needed (for pain).    [provider]  vancomycin (VANCOCIN) 125 MG capsule Take 1 capsule (125 mg total) by mouth 4 (four) times daily. 04/15/22   Pyrtle, Lajuan Lines, MD  vitamin B-12 (CYANOCOBALAMIN) 100 MCG tablet Take 100  mcg by mouth daily.    [provider]      Allergies    Other    Review of Systems   See HPI  Physical Exam Updated Vital Signs BP 112/76   Pulse 73   Temp 97.7 F (36.5 C) (Oral)   Resp 19   LMP 02/19/2022   SpO2 100%  Physical Exam Constitutional:      General: She is not in acute distress. HENT:     Head: Normocephalic and atraumatic.  Eyes:     Extraocular Movements: Extraocular movements intact.     Pupils: Pupils are equal, round, and reactive to light.  Cardiovascular:     Rate and Rhythm: Normal rate and regular rhythm.  Skin:    General: Skin is dry.     Comments: Decreased skin turgor  Neurological:     Mental Status: She is alert and oriented to person, place, and time.     GCS: GCS eye subscore is 4. GCS verbal subscore is 5. GCS motor subscore is 6.     Cranial Nerves: No cranial nerve deficit, dysarthria or facial asymmetry.     ED Results / Procedures / Treatments   Labs (all labs ordered are listed, but only abnormal results are displayed) Labs Reviewed  CBC WITH DIFFERENTIAL/PLATELET - Abnormal; Notable for the following components:      Result Value   RBC 2.88 (*)    Hemoglobin 8.4 (*)    HCT 26.7 (*)    RDW 24.3 (*)    nRBC 0.8 (*)    Abs Immature Granulocytes 0.26 (*)    All other components within normal limits  COMPREHENSIVE METABOLIC PANEL - Abnormal; Notable for the following components:   Sodium 131 (*)    CO2 19 (*)    Glucose, Bld 111 (*)    Creatinine, Ser 0.39 (*)    Calcium 7.8 (*)    Total Protein 5.8 (*)    Albumin 3.0 (*)    AST 227 (*)    ALT 274 (*)    Alkaline Phosphatase 801 (*)    Total Bilirubin 1.4 (*)    All other components within normal limits  PROTIME-INR    EKG None  Radiology CT Head Wo Contrast  Result Date: 04/26/2022 CLINICAL DATA:  Subdural hematoma, headache EXAM: CT HEAD WITHOUT CONTRAST TECHNIQUE: Contiguous axial images were obtained from the base of the skull through the vertex  without intravenous contrast. RADIATION DOSE REDUCTION: This exam was performed according to the departmental dose-optimization program which includes automated exposure control, adjustment of the mA and/or kV according to patient size and/or use of iterative reconstruction technique. COMPARISON:  03/07/2022 FINDINGS: Brain: Normal anatomic configuration of the brain. There is interval development of a small focus of cortical hypodensity with effacement of the gray-white matter differentiation involving the right perirolandic frontoparietal cortex at the vertex best seen  on axial image # 23 and sagittal image # 18. This is nonspecific though a small cortical infarct could appear in this fashion. No significant associated abnormal mass effect. No midline shift. No abnormal intra or extra-axial mass lesion or fluid collection. Previously noted right subdural hematoma is not visualized, however, this was not visualized on the prior examination as well; no acute intracranial hemorrhage identified. Ventricular size is normal. Cerebellum is unremarkable. Vascular: Unremarkable Skull: Intact Sinuses/Orbits: Paranasal sinuses are clear. Orbits are unremarkable. Other: Mastoid air cells and middle ear cavities are clear. IMPRESSION: 1. Interval development of a small focus of cortical hypodensity with effacement of the gray-white matter differentiation involving the right perirolandic frontoparietal cortex at the vertex. This is nonspecific though a small cortical infarct could appear in this fashion. Contrast enhanced MRI examination is recommended for further evaluation. 2. Presumed resolution of previously noted subdural hematoma. No acute intracranial hemorrhage identified. Electronically Signed   By: Fidela Salisbury M.D.   On: 04/26/2022 21:16    Procedures   Medications Ordered in ED Medications  heparin lock flush 100 unit/mL (has no administration in time range)  lactated ringers bolus 1,000 mL (0 mLs  Intravenous Stopped 04/26/22 2159)  prochlorperazine (COMPAZINE) injection 10 mg (10 mg Intravenous Given 04/26/22 2158)  diphenhydrAMINE (BENADRYL) injection 25 mg (25 mg Intravenous Given 04/26/22 2158)  acetaminophen (TYLENOL) tablet 650 mg (650 mg Oral Given 04/26/22 2158)    ED Course/ Medical Decision Making/ A&P                             Medical Decision Making Amount and/or Complexity of Data Reviewed Labs: ordered. Radiology: ordered.  Risk OTC drugs. Prescription drug management.   DOTTYE TANDON is a 37 y.o. female with PMH Crohn's status post hemicolectomy, recent C. difficile diagnosis, metastatic breast cancer currently getting chemo with Dr. Marin Olp presenting to the ED for headache and epistaxis.  Her epistaxis had resolved by the time she got to the ED and did not recur.  Her platelets here 186, with normal PT and INR.  This seems to be a chronic problem for her which was previously exacerbated by her thrombocytopenia.  Regarding her headache, she does have some features of migraine so we will opt to treat with migraine cocktail, but given her history of metastatic breast cancer and previously visualized subdural hematoma, will opt to scan again with noncontrast CT head.  She has not had any recent head trauma or loss of consciousness.  Given her reports of dizziness/lightheadedness and decreased skin turgor, opted to treat her with IV fluids as well with subsequent symptomatic improvement.  Of note, she does have elevated LFTs which have been rising over the past couple of weeks.  Per chart review, it seems like Dr. Marin Olp has been following this and is making changes in her chemotherapy regimen.  Alternatively, her Crohn's or prior C. difficile infection could be contributing to this.  Believe this can be followed up in the outpatient setting.  Update: CT head shows resolution of previously visualized subdural hematoma, but does have a small area of hypodensity in the  right frontotemporal region.  Unfortunately, cannot get follow-up MRI with and without contrast here at this time.  Consulted on-call oncologist and they were okay with outpatient MRI follow-up.  DDx for this lesion includes new infarct versus metastasis but if it is a new stroke, it would likely be subacute and subclinical without clear  neurological deficits.  I discussed this with the patient and her husband and they are amenable to discharge and outpatient follow-up with MRI at this time.  She feels like her headache has improved after migraine cocktail.  Provided appropriate return precautions in case she has worsening pain or new neurological deficits.  Will reach out to Dr. Myna Hidalgo for appropriate follow-up.  Final Clinical Impression(s) / ED Diagnoses Final diagnoses:  Nonintractable headache, unspecified chronicity pattern, unspecified headache type    Rx / DC Orders ED Discharge Orders     None         Lyndle Herrlich, MD 04/26/22 [id number]    Lorre Nick, MD 05/02/22 [phone]

## 2022-04-26 NOTE — ED Triage Notes (Signed)
Presents with a headache that began ~3PM today. Says headaches are more intense when laying flat.   Also had a nosebleed that lasted ~20 minutes with heavy pressure applied. Not a new occurrence.   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 Chemo tx yesterday.   Says has been  having low platelets but they were normal last time and HGB was ~8.

## 2022-04-27 ENCOUNTER — Encounter: Payer: Self-pay | Admitting: *Deleted

## 2022-04-27 ENCOUNTER — Other Ambulatory Visit: Payer: Self-pay | Admitting: Hematology & Oncology

## 2022-04-27 DIAGNOSIS — C50912 Malignant neoplasm of unspecified site of left female breast: Secondary | ICD-10-CM

## 2022-04-27 LAB — CANCER ANTIGEN 27.29: CA 27.29: 2870 U/mL — ABNORMAL HIGH (ref 0.0–38.6)

## 2022-04-28 ENCOUNTER — Inpatient Hospital Stay (HOSPITAL_COMMUNITY)
Admission: EM | Admit: 2022-04-28 | Discharge: 2022-05-25 | DRG: 054 | Disposition: E | Payer: Commercial Managed Care - PPO | Attending: Internal Medicine | Admitting: Internal Medicine

## 2022-04-28 ENCOUNTER — Encounter: Payer: Self-pay | Admitting: Hematology & Oncology

## 2022-04-28 ENCOUNTER — Other Ambulatory Visit: Payer: Self-pay

## 2022-04-28 ENCOUNTER — Telehealth: Payer: Self-pay | Admitting: *Deleted

## 2022-04-28 ENCOUNTER — Emergency Department (HOSPITAL_COMMUNITY): Payer: Commercial Managed Care - PPO

## 2022-04-28 ENCOUNTER — Inpatient Hospital Stay: Payer: Commercial Managed Care - PPO

## 2022-04-28 ENCOUNTER — Encounter: Payer: Self-pay | Admitting: Family

## 2022-04-28 DIAGNOSIS — R42 Dizziness and giddiness: Secondary | ICD-10-CM | POA: Diagnosis not present

## 2022-04-28 DIAGNOSIS — D649 Anemia, unspecified: Secondary | ICD-10-CM

## 2022-04-28 DIAGNOSIS — K920 Hematemesis: Principal | ICD-10-CM

## 2022-04-28 DIAGNOSIS — R54 Age-related physical debility: Secondary | ICD-10-CM | POA: Diagnosis present

## 2022-04-28 DIAGNOSIS — C7949 Secondary malignant neoplasm of other parts of nervous system: Principal | ICD-10-CM | POA: Diagnosis present

## 2022-04-28 DIAGNOSIS — E538 Deficiency of other specified B group vitamins: Secondary | ICD-10-CM | POA: Diagnosis present

## 2022-04-28 DIAGNOSIS — R1110 Vomiting, unspecified: Secondary | ICD-10-CM | POA: Diagnosis present

## 2022-04-28 DIAGNOSIS — C50919 Malignant neoplasm of unspecified site of unspecified female breast: Secondary | ICD-10-CM | POA: Diagnosis not present

## 2022-04-28 DIAGNOSIS — L658 Other specified nonscarring hair loss: Secondary | ICD-10-CM | POA: Diagnosis present

## 2022-04-28 DIAGNOSIS — G9340 Encephalopathy, unspecified: Secondary | ICD-10-CM | POA: Diagnosis not present

## 2022-04-28 DIAGNOSIS — K219 Gastro-esophageal reflux disease without esophagitis: Secondary | ICD-10-CM

## 2022-04-28 DIAGNOSIS — D630 Anemia in neoplastic disease: Secondary | ICD-10-CM | POA: Diagnosis present

## 2022-04-28 DIAGNOSIS — Z171 Estrogen receptor negative status [ER-]: Secondary | ICD-10-CM

## 2022-04-28 DIAGNOSIS — D519 Vitamin B12 deficiency anemia, unspecified: Secondary | ICD-10-CM | POA: Diagnosis not present

## 2022-04-28 DIAGNOSIS — Z5986 Financial insecurity: Secondary | ICD-10-CM

## 2022-04-28 DIAGNOSIS — E876 Hypokalemia: Secondary | ICD-10-CM | POA: Diagnosis present

## 2022-04-28 DIAGNOSIS — Z9049 Acquired absence of other specified parts of digestive tract: Secondary | ICD-10-CM | POA: Diagnosis not present

## 2022-04-28 DIAGNOSIS — Z7982 Long term (current) use of aspirin: Secondary | ICD-10-CM

## 2022-04-28 DIAGNOSIS — K56609 Unspecified intestinal obstruction, unspecified as to partial versus complete obstruction: Secondary | ICD-10-CM | POA: Diagnosis present

## 2022-04-28 DIAGNOSIS — Z8041 Family history of malignant neoplasm of ovary: Secondary | ICD-10-CM

## 2022-04-28 DIAGNOSIS — C50412 Malignant neoplasm of upper-outer quadrant of left female breast: Secondary | ICD-10-CM | POA: Diagnosis not present

## 2022-04-28 DIAGNOSIS — B259 Cytomegaloviral disease, unspecified: Secondary | ICD-10-CM | POA: Diagnosis present

## 2022-04-28 DIAGNOSIS — M7989 Other specified soft tissue disorders: Secondary | ICD-10-CM | POA: Diagnosis not present

## 2022-04-28 DIAGNOSIS — Z20828 Contact with and (suspected) exposure to other viral communicable diseases: Secondary | ICD-10-CM | POA: Diagnosis not present

## 2022-04-28 DIAGNOSIS — E559 Vitamin D deficiency, unspecified: Secondary | ICD-10-CM | POA: Diagnosis present

## 2022-04-28 DIAGNOSIS — R7989 Other specified abnormal findings of blood chemistry: Secondary | ICD-10-CM

## 2022-04-28 DIAGNOSIS — Z7952 Long term (current) use of systemic steroids: Secondary | ICD-10-CM

## 2022-04-28 DIAGNOSIS — Z8679 Personal history of other diseases of the circulatory system: Secondary | ICD-10-CM | POA: Diagnosis not present

## 2022-04-28 DIAGNOSIS — G038 Meningitis due to other specified causes: Secondary | ICD-10-CM | POA: Diagnosis present

## 2022-04-28 DIAGNOSIS — C7931 Secondary malignant neoplasm of brain: Secondary | ICD-10-CM | POA: Diagnosis not present

## 2022-04-28 DIAGNOSIS — D6959 Other secondary thrombocytopenia: Secondary | ICD-10-CM | POA: Diagnosis present

## 2022-04-28 DIAGNOSIS — D6869 Other thrombophilia: Secondary | ICD-10-CM | POA: Diagnosis present

## 2022-04-28 DIAGNOSIS — K56600 Partial intestinal obstruction, unspecified as to cause: Secondary | ICD-10-CM | POA: Diagnosis not present

## 2022-04-28 DIAGNOSIS — C7932 Secondary malignant neoplasm of cerebral meninges: Secondary | ICD-10-CM | POA: Diagnosis not present

## 2022-04-28 DIAGNOSIS — Z800 Family history of malignant neoplasm of digestive organs: Secondary | ICD-10-CM

## 2022-04-28 DIAGNOSIS — T50905A Adverse effect of unspecified drugs, medicaments and biological substances, initial encounter: Secondary | ICD-10-CM | POA: Diagnosis not present

## 2022-04-28 DIAGNOSIS — Z515 Encounter for palliative care: Secondary | ICD-10-CM

## 2022-04-28 DIAGNOSIS — Z66 Do not resuscitate: Secondary | ICD-10-CM | POA: Diagnosis present

## 2022-04-28 DIAGNOSIS — F419 Anxiety disorder, unspecified: Secondary | ICD-10-CM | POA: Diagnosis present

## 2022-04-28 DIAGNOSIS — A0472 Enterocolitis due to Clostridium difficile, not specified as recurrent: Secondary | ICD-10-CM | POA: Diagnosis present

## 2022-04-28 DIAGNOSIS — I6389 Other cerebral infarction: Secondary | ICD-10-CM | POA: Diagnosis not present

## 2022-04-28 DIAGNOSIS — C50912 Malignant neoplasm of unspecified site of left female breast: Secondary | ICD-10-CM | POA: Diagnosis present

## 2022-04-28 DIAGNOSIS — Z803 Family history of malignant neoplasm of breast: Secondary | ICD-10-CM

## 2022-04-28 DIAGNOSIS — R519 Headache, unspecified: Secondary | ICD-10-CM | POA: Diagnosis not present

## 2022-04-28 DIAGNOSIS — K50012 Crohn's disease of small intestine with intestinal obstruction: Secondary | ICD-10-CM

## 2022-04-28 DIAGNOSIS — Z8619 Personal history of other infectious and parasitic diseases: Secondary | ICD-10-CM

## 2022-04-28 DIAGNOSIS — K50912 Crohn's disease, unspecified, with intestinal obstruction: Secondary | ICD-10-CM | POA: Diagnosis not present

## 2022-04-28 DIAGNOSIS — E871 Hypo-osmolality and hyponatremia: Secondary | ICD-10-CM | POA: Diagnosis present

## 2022-04-28 DIAGNOSIS — R945 Abnormal results of liver function studies: Secondary | ICD-10-CM | POA: Diagnosis not present

## 2022-04-28 DIAGNOSIS — I639 Cerebral infarction, unspecified: Secondary | ICD-10-CM | POA: Diagnosis not present

## 2022-04-28 DIAGNOSIS — D6481 Anemia due to antineoplastic chemotherapy: Secondary | ICD-10-CM | POA: Diagnosis present

## 2022-04-28 DIAGNOSIS — R748 Abnormal levels of other serum enzymes: Secondary | ICD-10-CM | POA: Diagnosis not present

## 2022-04-28 DIAGNOSIS — R040 Epistaxis: Secondary | ICD-10-CM | POA: Diagnosis present

## 2022-04-28 DIAGNOSIS — Z1501 Genetic susceptibility to malignant neoplasm of breast: Secondary | ICD-10-CM

## 2022-04-28 DIAGNOSIS — I6349 Cerebral infarction due to embolism of other cerebral artery: Secondary | ICD-10-CM | POA: Diagnosis present

## 2022-04-28 DIAGNOSIS — Z7189 Other specified counseling: Secondary | ICD-10-CM | POA: Diagnosis not present

## 2022-04-28 DIAGNOSIS — G96198 Other disorders of meninges, not elsewhere classified: Secondary | ICD-10-CM | POA: Diagnosis not present

## 2022-04-28 DIAGNOSIS — C7951 Secondary malignant neoplasm of bone: Secondary | ICD-10-CM | POA: Diagnosis not present

## 2022-04-28 DIAGNOSIS — Z808 Family history of malignant neoplasm of other organs or systems: Secondary | ICD-10-CM

## 2022-04-28 DIAGNOSIS — R110 Nausea: Secondary | ICD-10-CM | POA: Diagnosis not present

## 2022-04-28 DIAGNOSIS — T451X5A Adverse effect of antineoplastic and immunosuppressive drugs, initial encounter: Secondary | ICD-10-CM | POA: Diagnosis present

## 2022-04-28 DIAGNOSIS — I38 Endocarditis, valve unspecified: Secondary | ICD-10-CM | POA: Diagnosis not present

## 2022-04-28 DIAGNOSIS — K716 Toxic liver disease with hepatitis, not elsewhere classified: Secondary | ICD-10-CM | POA: Diagnosis present

## 2022-04-28 DIAGNOSIS — D6859 Other primary thrombophilia: Secondary | ICD-10-CM | POA: Diagnosis not present

## 2022-04-28 DIAGNOSIS — Z8249 Family history of ischemic heart disease and other diseases of the circulatory system: Secondary | ICD-10-CM

## 2022-04-28 DIAGNOSIS — Z801 Family history of malignant neoplasm of trachea, bronchus and lung: Secondary | ICD-10-CM

## 2022-04-28 DIAGNOSIS — R0602 Shortness of breath: Secondary | ICD-10-CM | POA: Diagnosis not present

## 2022-04-28 DIAGNOSIS — Z7901 Long term (current) use of anticoagulants: Secondary | ICD-10-CM | POA: Diagnosis not present

## 2022-04-28 DIAGNOSIS — R29810 Facial weakness: Secondary | ICD-10-CM | POA: Diagnosis present

## 2022-04-28 DIAGNOSIS — R5381 Other malaise: Secondary | ICD-10-CM | POA: Diagnosis present

## 2022-04-28 DIAGNOSIS — K50913 Crohn's disease, unspecified, with fistula: Secondary | ICD-10-CM | POA: Diagnosis not present

## 2022-04-28 DIAGNOSIS — K50918 Crohn's disease, unspecified, with other complication: Secondary | ICD-10-CM | POA: Diagnosis not present

## 2022-04-28 LAB — CBC
HCT: 26.1 % — ABNORMAL LOW (ref 36.0–46.0)
Hemoglobin: 8.3 g/dL — ABNORMAL LOW (ref 12.0–15.0)
MCH: 29.3 pg (ref 26.0–34.0)
MCHC: 31.8 g/dL (ref 30.0–36.0)
MCV: 92.2 fL (ref 80.0–100.0)
Platelets: 141 10*3/uL — ABNORMAL LOW (ref 150–400)
RBC: 2.83 MIL/uL — ABNORMAL LOW (ref 3.87–5.11)
RDW: 23.7 % — ABNORMAL HIGH (ref 11.5–15.5)
WBC: 4.9 10*3/uL (ref 4.0–10.5)
nRBC: 0 % (ref 0.0–0.2)

## 2022-04-28 LAB — CBC WITH DIFFERENTIAL/PLATELET
Abs Immature Granulocytes: 0.05 10*3/uL (ref 0.00–0.07)
Basophils Absolute: 0 10*3/uL (ref 0.0–0.1)
Basophils Relative: 0 %
Eosinophils Absolute: 0 10*3/uL (ref 0.0–0.5)
Eosinophils Relative: 0 %
HCT: 22.5 % — ABNORMAL LOW (ref 36.0–46.0)
Hemoglobin: 7.2 g/dL — ABNORMAL LOW (ref 12.0–15.0)
Immature Granulocytes: 1 %
Lymphocytes Relative: 16 %
Lymphs Abs: 1.1 10*3/uL (ref 0.7–4.0)
MCH: 29.3 pg (ref 26.0–34.0)
MCHC: 32 g/dL (ref 30.0–36.0)
MCV: 91.5 fL (ref 80.0–100.0)
Monocytes Absolute: 0.1 10*3/uL (ref 0.1–1.0)
Monocytes Relative: 1 %
Neutro Abs: 5.6 10*3/uL (ref 1.7–7.7)
Neutrophils Relative %: 82 %
Platelets: 164 10*3/uL (ref 150–400)
RBC: 2.46 MIL/uL — ABNORMAL LOW (ref 3.87–5.11)
RDW: 23.2 % — ABNORMAL HIGH (ref 11.5–15.5)
Smear Review: ADEQUATE
WBC: 6.8 10*3/uL (ref 4.0–10.5)
nRBC: 0 % (ref 0.0–0.2)

## 2022-04-28 LAB — COMPREHENSIVE METABOLIC PANEL
ALT: 355 U/L — ABNORMAL HIGH (ref 0–44)
AST: 252 U/L — ABNORMAL HIGH (ref 15–41)
Albumin: 3 g/dL — ABNORMAL LOW (ref 3.5–5.0)
Alkaline Phosphatase: 984 U/L — ABNORMAL HIGH (ref 38–126)
Anion gap: 7 (ref 5–15)
BUN: 6 mg/dL (ref 6–20)
CO2: 17 mmol/L — ABNORMAL LOW (ref 22–32)
Calcium: 7.8 mg/dL — ABNORMAL LOW (ref 8.9–10.3)
Chloride: 106 mmol/L (ref 98–111)
Creatinine, Ser: 0.39 mg/dL — ABNORMAL LOW (ref 0.44–1.00)
GFR, Estimated: >60 mL/min (ref >60)
Glucose, Bld: 118 mg/dL — ABNORMAL HIGH (ref 70–99)
Potassium: 3.8 mmol/L (ref 3.5–5.1)
Sodium: 130 mmol/L — ABNORMAL LOW (ref 135–145)
Total Bilirubin: 1.6 mg/dL — ABNORMAL HIGH (ref 0.3–1.2)
Total Protein: 6 g/dL — ABNORMAL LOW (ref 6.5–8.1)

## 2022-04-28 LAB — MAGNESIUM: Magnesium: 2 mg/dL (ref 1.7–2.4)

## 2022-04-28 LAB — PROTEIN AND GLUCOSE, CSF
Glucose, CSF: 20 mg/dL — CL (ref 40–70)
Total  Protein, CSF: 48 mg/dL — ABNORMAL HIGH (ref 15–45)

## 2022-04-28 LAB — CSF CELL COUNT WITH DIFFERENTIAL
Eosinophils, CSF: 0 % (ref 0–1)
Lymphs, CSF: 10 % — ABNORMAL LOW (ref 40–80)
Monocyte-Macrophage-Spinal Fluid: 9 % — ABNORMAL LOW (ref 15–45)
RBC Count, CSF: 1 /mm3 — ABNORMAL HIGH
Segmented Neutrophils-CSF: 81 % — ABNORMAL HIGH (ref 0–6)
Tube #: 3
WBC, CSF: 50 /mm3 (ref 0–5)

## 2022-04-28 LAB — I-STAT BETA HCG BLOOD, ED (MC, WL, AP ONLY): I-stat hCG, quantitative: <5 m[IU]/mL (ref <5)

## 2022-04-28 LAB — HEMOGLOBIN AND HEMATOCRIT, BLOOD
HCT: 26.5 % — ABNORMAL LOW (ref 36.0–46.0)
Hemoglobin: 9.2 g/dL — ABNORMAL LOW (ref 12.0–15.0)

## 2022-04-28 MED ORDER — MELATONIN 3 MG PO TABS
3.0000 mg | ORAL_TABLET | Freq: Every evening | ORAL | Status: DC | PRN
  Administered 2022-04-28 – 2022-04-30 (×3): 3 mg via ORAL
  Filled 2022-04-28 (×3): qty 1

## 2022-04-28 MED ORDER — ACETAMINOPHEN 650 MG RE SUPP: 650.0000 mg | RECTAL | Status: DC | PRN

## 2022-04-28 MED ORDER — VANCOMYCIN HCL 750 MG/150ML IV SOLN: 750.0000 mg | Freq: Two times a day (BID) | INTRAVENOUS | Status: DC

## 2022-04-28 MED ORDER — DIPHENHYDRAMINE HCL 50 MG/ML IJ SOLN: 12.5000 mg | Freq: Four times a day (QID) | INTRAMUSCULAR | Status: DC | PRN

## 2022-04-28 MED ORDER — PROCHLORPERAZINE EDISYLATE 10 MG/2ML IJ SOLN
10.0000 mg | Freq: Four times a day (QID) | INTRAMUSCULAR | Status: DC | PRN
  Administered 2022-05-02 (×2): 10 mg via INTRAVENOUS
  Filled 2022-04-28 (×2): qty 2

## 2022-04-28 MED ORDER — LIDOCAINE HCL (PF) 1 % IJ SOLN
5.0000 mL | Freq: Once | INTRAMUSCULAR | Status: AC
  Administered 2022-04-28: 5 mL via INTRADERMAL

## 2022-04-28 MED ORDER — SACCHAROMYCES BOULARDII 250 MG PO CAPS
250.0000 mg | ORAL_CAPSULE | Freq: Two times a day (BID) | ORAL | Status: DC
  Administered 2022-04-28 – 2022-05-08 (×18): 250 mg via ORAL
  Filled 2022-04-28 (×21): qty 1

## 2022-04-28 MED ORDER — SODIUM CHLORIDE 0.9 % IV SOLN
2.0000 g | Freq: Two times a day (BID) | INTRAVENOUS | Status: DC
  Administered 2022-04-28 – 2022-04-29 (×2): 2 g via INTRAVENOUS
  Filled 2022-04-28 (×2): qty 20

## 2022-04-28 MED ORDER — ACETAMINOPHEN 160 MG/5ML PO SOLN: 650.0000 mg | ORAL | Status: DC | PRN

## 2022-04-28 MED ORDER — ENOXAPARIN SODIUM 40 MG/0.4ML IJ SOSY
40.0000 mg | PREFILLED_SYRINGE | INTRAMUSCULAR | Status: DC
  Administered 2022-04-28: 40 mg via SUBCUTANEOUS
  Filled 2022-04-28: qty 0.4

## 2022-04-28 MED ORDER — ENOXAPARIN SODIUM 40 MG/0.4ML IJ SOSY
40.0000 mg | PREFILLED_SYRINGE | INTRAMUSCULAR | Status: DC
Start: 2022-04-28 — End: 2022-04-28

## 2022-04-28 MED ORDER — KETOROLAC TROMETHAMINE 15 MG/ML IJ SOLN
15.0000 mg | Freq: Once | INTRAMUSCULAR | Status: AC
  Administered 2022-04-28: 15 mg via INTRAVENOUS
  Filled 2022-04-28: qty 1

## 2022-04-28 MED ORDER — STROKE: EARLY STAGES OF RECOVERY BOOK
Freq: Once | Status: AC
  Filled 2022-04-28: qty 1

## 2022-04-28 MED ORDER — VANCOMYCIN HCL 1250 MG/250ML IV SOLN
1250.0000 mg | Freq: Once | INTRAVENOUS | Status: DC
  Filled 2022-04-28: qty 250

## 2022-04-28 MED ORDER — GADOBUTROL 1 MMOL/ML IV SOLN
5.0000 mL | Freq: Once | INTRAVENOUS | Status: AC | PRN
  Administered 2022-04-28: 5 mL via INTRAVENOUS

## 2022-04-28 MED ORDER — VANCOMYCIN HCL 1250 MG/250ML IV SOLN
1250.0000 mg | Freq: Once | INTRAVENOUS | Status: AC
  Administered 2022-04-28: 1250 mg via INTRAVENOUS
  Filled 2022-04-28: qty 250

## 2022-04-28 MED ORDER — ACETAMINOPHEN 325 MG PO TABS: 650.0000 mg | ORAL_TABLET | ORAL | Status: DC | PRN

## 2022-04-28 MED ORDER — SODIUM CHLORIDE 0.9 % IV SOLN: INTRAVENOUS | Status: DC

## 2022-04-28 MED ORDER — LORAZEPAM 0.5 MG PO TABS: 0.5000 mg | ORAL_TABLET | Freq: Four times a day (QID) | ORAL | Status: DC | PRN

## 2022-04-28 MED ORDER — HYDROCODONE-ACETAMINOPHEN 5-325 MG PO TABS
1.0000 | ORAL_TABLET | Freq: Four times a day (QID) | ORAL | Status: DC | PRN
  Administered 2022-04-28 – 2022-05-02 (×6): 1 via ORAL
  Filled 2022-04-28 (×6): qty 1

## 2022-04-28 MED ORDER — SODIUM CHLORIDE 0.9 % IV SOLN
2.0000 g | INTRAVENOUS | Status: DC
  Administered 2022-04-28 – 2022-04-29 (×4): 2 g via INTRAVENOUS
  Filled 2022-04-28 (×6): qty 2000

## 2022-04-28 MED ORDER — SODIUM CHLORIDE 0.9 % IV BOLUS
1000.0000 mL | Freq: Once | INTRAVENOUS | Status: AC
  Administered 2022-04-28: 1000 mL via INTRAVENOUS

## 2022-04-28 MED ORDER — VANCOMYCIN HCL 750 MG/150ML IV SOLN
750.0000 mg | Freq: Two times a day (BID) | INTRAVENOUS | Status: DC
  Filled 2022-04-28: qty 150

## 2022-04-28 MED ORDER — LIDOCAINE-PRILOCAINE 2.5-2.5 % EX CREA: 1.0000 | TOPICAL_CREAM | CUTANEOUS | Status: DC | PRN

## 2022-04-28 MED ORDER — PROCHLORPERAZINE EDISYLATE 10 MG/2ML IJ SOLN
10.0000 mg | Freq: Once | INTRAMUSCULAR | Status: AC
  Administered 2022-04-28: 10 mg via INTRAVENOUS
  Filled 2022-04-28: qty 2

## 2022-04-28 MED ORDER — ASPIRIN 81 MG PO CHEW
81.0000 mg | CHEWABLE_TABLET | Freq: Every day | ORAL | Status: DC
  Administered 2022-04-28: 81 mg via ORAL
  Filled 2022-04-28 (×2): qty 1

## 2022-04-28 MED ORDER — PANTOPRAZOLE SODIUM 40 MG PO TBEC
40.0000 mg | DELAYED_RELEASE_TABLET | Freq: Two times a day (BID) | ORAL | Status: DC
  Administered 2022-04-28 – 2022-05-01 (×6): 40 mg via ORAL
  Filled 2022-04-28 (×6): qty 1

## 2022-04-28 NOTE — Telephone Encounter (Signed)
Call received to front office from pt's husband stating that he had to call 911 for pt.  He states that while pt was on the way here and getting in the car she became extremely dizzy and started vomiting blood. Dr. Marin Olp notified.

## 2022-04-28 NOTE — ED Triage Notes (Addendum)
Pt BIB [facility] from home after bloody emesis and  dizziness with blurry vision. Pulsating HA.   124/80 90 hr 100 o2 131 CBG

## 2022-04-28 NOTE — H&P (Addendum)
History and Physical    Patient: Carla Clark [id number] DOB: 1985/02/02 DOA: 04/28/2022 DOS: the patient was seen and examined on 04/28/2022 PCP: Mosie Lukes, MD  Patient coming from: Home  Chief Complaint:  Chief Complaint  Patient presents with   Emesis   Dizziness   HPI: Carla Clark is a 37 y.o. female with medical history significant of metastatic breast cancer currently undergoing chemotherapy, Crohn's disease with ileitis s/p left hemicolectomy, SBO, and gerd who presented for headache and emesis.  Last couple days she had been having bitemporal headache which wrapped around and behind her neck.  She had been seen at [facility] emergency department 2 days ago for the symptoms.  They performed a CT scan of the head which had noted a small focus of cortical hypodensity with effacement of the gray-white matter differentiation involving the right perirolandic frontoparietal cortex at the vertex that was thought to be nonspecific but could be a small cortical infarct.  However, at that time MRI was not available and patient's symptoms seem to improve with treatment for which she was discharged to follow-up in the outpatient setting.  However, symptoms returned and patient had not been able to rest at night.  This morning she had vomited and blood was present in the emesis.  Her husband notes that she had been dealing with frequent nosebleeds and is not on any NSAIDs at baseline.  Patient also reported that she had just recently had C. difficile and had just recently completed her course of oral vancomycin.  She has been receiving chemotherapy in the outpatient setting with oncology and previously admitted treated with [facility], but the medication had to be stopped due to elevated liver enzymes.   In the ED patient was noted to have stable vital signs.  Labs significant for hemoglobin 7.2, sodium 130, CO2 17, BUN 6, creatinine 0.39, calcium 7.8, alkaline phosphatase 984, AST 252, ALT 355,  and total bilirubin 1.6.  MRI of the brain noted 1.4 focus of enhancement within or along the right postcentral sulcus with adjacent cortical edema, 1.3 focus of enhancement along the medial left parietal lobe with adjacent cortical edema, multiple infarcts noted within the bilateral frontal parietal lobes of various ages thought to be embolic in nature, and prominent enhancement bilateral 7th and 8th cranial nerves within the auditory canals concern for leptomeningeal metastatic disease.  Patient was not a candidate for tPA due to being outside the window.  Neurology had recommended LP.  IR took patient for LP.   Review of Systems: As mentioned in the history of present illness. All other systems reviewed and are negative. Past Medical History:  Diagnosis Date   Anal fissure    Anemia 11/2011   iron deficiency. 01/2013 parenteral iron. Intranasal B12.  Dr Marin Olp follows.    B12 deficiency 11/2012   Breast cancer    Breast cancer metastasized to multiple sites, left 02/02/2022   Breast mass in female 04/21/2012   Cervical cancer screening 01/27/2012   Chicken pox as a child   Crohn's disease 2013   ileitis 10/2011. ileitis, mesenteric abscess, ? entero-enteric fistula on 08/2012 CT enterography   Dermatitis 08/22/2016   GERD (gastroesophageal reflux disease)    Hepatitis B immune 07/2012   previous vaccination.    Hives 09/2014   per Dermatologist.  Rx Zyrtec, Benadryl.    Ileitis    Serrated polyp of colon    Small bowel obstruction    SVD (spontaneous vaginal delivery) 08/13/2018  Vitamin D deficiency 2014   Past Surgical History:  Procedure Laterality Date   LAPAROSCOPIC RIGHT HEMI COLECTOMY  07/11/2019   WISDOM TOOTH EXTRACTION  37 yrs old   Social History:  reports that she has never smoked. She has never used smokeless tobacco. She reports that she does not currently use alcohol. She reports that she does not use drugs.  Allergies  Allergen Reactions   Other Other (See  Comments)    History of Crohn's disease- NO seeds, tomatoes, anything that doesn't digest well, etc.....    Family History  Problem Relation Age of Onset   Nephrolithiasis Father    Brain cancer Father    Other Father        anaplastic astrocytoma   Ovarian cancer Paternal Aunt        ovarian   Breast cancer Paternal Aunt    Cancer Paternal Aunt        breast (2019) and ovarian cancer (2005)   Other Paternal Uncle 82       cyst removed from pancreas   Pancreatic cancer Paternal Uncle        Stage 1   Cancer Paternal Uncle        pancreatic   Lung cancer Maternal Grandmother    Gout Maternal Grandfather    Heart attack Maternal Grandfather    Ovarian cancer Paternal Grandmother    Cancer Paternal Grandmother 83       ovarian   Pancreatic cancer Paternal Grandfather    Colon cancer Neg Hx    Stomach cancer Neg Hx    Esophageal cancer Neg Hx     Prior to Admission medications   Medication Sig Start Date End Date Taking? Authorizing Provider  cefdinir (OMNICEF) 300 MG capsule Take 2 capsules (600 mg total) by mouth daily. 03/28/22   Celso Amy, NP  docusate sodium (COLACE) 100 MG capsule Take 1 capsule (100 mg total) by mouth 2 (two) times daily. 03/11/22   Swayze, Ava, DO  fluconazole (DIFLUCAN) 100 MG tablet Take 1 tablet (100 mg total) by mouth daily. 03/28/22   Celso Amy, NP  hydrocortisone (ANUSOL-HC) 2.5 % rectal cream Place 1 Application rectally 2 (two) times daily. 11/10/21   Pyrtle, Lajuan Lines, MD  lidocaine-prilocaine (EMLA) cream Apply 1 Application topically See admin instructions. Apply a dime-size of cream to port-a-cath 1-2 hours prior to access. Cover with [employer]. 02/14/22   [provider]  LORazepam (ATIVAN) 0.5 MG tablet Take 1 tablet (0.5 mg total) by mouth every 6 (six) hours as needed for anxiety. 04/22/22 05/22/22  Volanda Napoleon, MD  melatonin 3 MG TABS tablet Take 3 mg by mouth at bedtime as needed.    [provider]   methylPREDNISolone (MEDROL DOSEPAK) 4 MG TBPK tablet Take as directed 04/18/22   Volanda Napoleon, MD  ondansetron (ZOFRAN) 8 MG tablet Take 1 tablet (8 mg total) by mouth every 8 (eight) hours as needed for nausea or vomiting. 03/01/22   Volanda Napoleon, MD  pantoprazole (PROTONIX) 40 MG tablet Take 1 tablet (40 mg total) by mouth 2 (two) times daily. 03/28/22   Celso Amy, NP  polyethylene glycol (MIRALAX / GLYCOLAX) 17 g packet Take 17 g by mouth daily as needed for mild constipation. 03/11/22   Swayze, Ava, DO  predniSONE (DELTASONE) 10 MG tablet Take 8 tablets (80 mg total) by mouth daily with breakfast. 03/28/22   Celso Amy, NP  prochlorperazine (COMPAZINE) 10 MG  tablet Take 1 tablet (10 mg total) by mouth every 6 (six) hours as needed for nausea or vomiting. 03/01/22   Volanda Napoleon, MD  pyridoxine (NEURO-K-250 VITAMIN B6) 250 MG tablet Take 2 tablets (500 mg total) by mouth daily. 02/24/22   Volanda Napoleon, MD  traMADol (ULTRAM) 50 MG tablet Take 50 mg by mouth every 6 (six) hours as needed (for pain).    [provider]  vancomycin (VANCOCIN) 125 MG capsule Take 1 capsule (125 mg total) by mouth 4 (four) times daily. 04/15/22   Pyrtle, Lajuan Lines, MD  vitamin B-12 (CYANOCOBALAMIN) 100 MCG tablet Take 100 mcg by mouth daily.    [provider]    Physical Exam: Vitals:   04/28/22 1235 04/28/22 1238 04/28/22 1315 04/28/22 1330  BP: 109/70  105/71 109/69  Pulse: 80  73 74  Resp: 16 16 16 14   Temp:      TempSrc:      SpO2: 100%  100% 100%  Weight:      Height:         Constitutional: Young female who appears ill, but in no acute distress at this time Eyes: PERRL, lids and conjunctivae normal ENMT: Mucous membranes are moist.  Fair dentition. Neck: normal, supple  Respiratory: clear to auscultation bilaterally, no wheezing, no crackles. Normal respiratory effort. No accessory muscle use.  Cardiovascular: Regular rate and rhythm, no murmurs / rubs / gallops.   Abdomen: no tenderness, no masses palpated. No hepatosplenomegaly. Bowel sounds positive.  Musculoskeletal: no clubbing / cyanosis. No joint deformity upper and lower extremities. Good ROM, no contractures. Normal muscle tone.  Skin: no rashes, lesions, ulcers. No induration Neurologic: CN 2-12 grossly intact. Sensation intact, DTR normal. Strength 5/5 in all 4.  Psychiatric: Normal judgment and insight. Alert and oriented x 3. Normal mood.   Data Reviewed:  Reviewed labs, imaging, and pertinent records as noted above.  Assessment and Plan:  Multiple CVAs Leptomeningeal spread Acute/subacute.  Patient presents with complaints of temporal headache with reports of vomiting.  MRI significant for 1.4 cm focus of enhancement with adjacent cortical edema and a 1.3 cm focus of enhancement with adjacent edema concerning for subacute infarcts versus leptomeningeal metastatic disease, multiple foci of signal abnormality noted in the bilateral frontal parietal lobes measuring up to 2.3 cm concerning for infarcts of various age, and bilateral 7th and 8th cranial nerve enhancement.  Patient was not a candidate for tPA due to being outside of the therapeutic window. Lumbar puncture have been obtained to evaluate for concern for leptomeningeal spread.  CSF fluid revealed glucose 20, WBC 50, and protein 48.  Question possibility of meningitis, but seems less likely. -Admit to at telemetry bed  -Neurochecks -Follow-up blood cultures and CSF cytology/culture -Check CT scan of the head and neck -Check echocardiogram -PT/OT/speech to evaluate and treat -Aspirin neurology, but will need to continue to monitor blood counts -Empiric antibiotics of vancomycin, Rocephin, and ampicillin  Normocytic anemia Frequent nosebleed Acute on chronic.  Hemoglobin 7.2 on admission but previously had been 8.4 when last checked on 4/2.  Emesis was noted to have bright red blood present.  She had been dealing with nosebleeds  ever since started on chemotherapy.  Platelet counts are within normal limits at this time.  Question -Type and screen -Recheck H&H -Transfuse blood products as needed for hemoglobins less than 7 g/dL or patient symptomatic.  Metastatic breast cancer Patient with metastatic breast cancer that had spread to bone.  Followed  by Dr. Marin Olp and currently receiving chemotherapy.  Previously on Keytruda, but had to be discontinued due to elevated liver enzymes.  Currently Ca 27.29 was noted to be 2870. -Dr. Marin Olp added to treatment team  Hyponatremia Acute.  Sodium 130. -Normal saline IV fluids at 75 ml/hr -Recheck CBC tomorrow morning.  Elevated liver enzymes Acute on chronic.  Alkaline phosphatase 984, AST 252, ALT 355, and total bilirubin 1.6.  Levels appear to be trending up. Recent MRI of the abdomen with and without contrast performed on 3/ 2024 had not noted any evidence of liver metastases or other hepatic masses -Continue to monitor LFTs  Recent history of C. Difficile Patient reported completing her course of oral vancomycin recently. -Monitor intake and output -Add-on probiotics  GERD -Continue Protonix   DVT prophylaxis: SCDs Advance Care Planning:   Code Status: Full Code   Consults: Neurology  Family Communication: Husband and patient mother updated at bedside  Severity of Illness: The appropriate patient status for this patient is INPATIENT. Inpatient status is judged to be reasonable and necessary in order to provide the required intensity of service to ensure the patient's safety. The patient's presenting symptoms, physical exam findings, and initial radiographic and laboratory data in the context of their chronic comorbidities is felt to place them at high risk for further clinical deterioration. Furthermore, it is not anticipated that the patient will be medically stable for discharge from the hospital within 2 midnights of admission.   * I certify that at the  point of admission it is my clinical judgment that the patient will require inpatient hospital care spanning beyond 2 midnights from the point of admission due to high intensity of service, high risk for further deterioration and high frequency of surveillance required.*  Author: Norval Morton, MD 04/28/2022 2:11 PM  For on call review [email or web address].

## 2022-04-28 NOTE — ED Notes (Addendum)
CSF white count 50 Critical high with atypical cells. CSF glucose 20 critical. MD made aware.

## 2022-04-28 NOTE — Consult Note (Addendum)
Neurology Consultation  Reason for Consult: strokes on MRI  Referring Physician: Dr. Vanita Panda   CC: HA, blurry vision, dizziness and emesis   History is obtained from: patient, family and medical record   HPI: Carla Clark is a 37 y.o. female with past medical history of metastatic breast cancer undergoing chemotherapy, Crohns disease s/p hemicolectomy with recent C diff infection, B12 deficiency, vit d deficiency, GERD, small bowel obstruction who presents to the Ed for evaluation of persistent headache and vomiting. Since starting chemo she has had n/v and headaches but for the last few days these headaches have been persistent and unrelieved by anything. She endorses having some blurry vision today and generalized weakness. She denies numbness, tingling, weakness, facial droop or slurred speech.  MRI brain with acute/subacute infarcts as well as concern for leptomeningeal metastatic disease. Neurology consulted. Recommend LP   LKW: unclear IV thrombolysis given?: no, outside of window  EVT:  No LVO Premorbid modified Rankin scale (mRS): 0 0-Completely asymptomatic and back to baseline post-stroke   ROS: Full ROS was performed and is negative except as noted in the HPI.   Past Medical History:  Diagnosis Date   Anal fissure    Anemia 11/2011   iron deficiency. 01/2013 parenteral iron. Intranasal B12.  Dr Marin Olp follows.    B12 deficiency 11/2012   Breast cancer    Breast cancer metastasized to multiple sites, left 02/02/2022   Breast mass in female 04/21/2012   Cervical cancer screening 01/27/2012   Chicken pox as a child   Crohn's disease 2013   ileitis 10/2011. ileitis, mesenteric abscess, ? entero-enteric fistula on 08/2012 CT enterography   Dermatitis 08/22/2016   GERD (gastroesophageal reflux disease)    Hepatitis B immune 07/2012   previous vaccination.    Hives 09/2014   per Dermatologist.  Rx Zyrtec, Benadryl.    Ileitis    Serrated polyp of colon    Small bowel  obstruction    SVD (spontaneous vaginal delivery) 08/13/2018   Vitamin D deficiency 2014     Family History  Problem Relation Age of Onset   Nephrolithiasis Father    Brain cancer Father    Other Father        anaplastic astrocytoma   Ovarian cancer Paternal Aunt        ovarian   Breast cancer Paternal Aunt    Cancer Paternal Aunt        breast (2019) and ovarian cancer (2005)   Other Paternal Uncle 35       cyst removed from pancreas   Pancreatic cancer Paternal Uncle        Stage 1   Cancer Paternal Uncle        pancreatic   Lung cancer Maternal Grandmother    Gout Maternal Grandfather    Heart attack Maternal Grandfather    Ovarian cancer Paternal Grandmother    Cancer Paternal Grandmother 83       ovarian   Pancreatic cancer Paternal Grandfather    Colon cancer Neg Hx    Stomach cancer Neg Hx    Esophageal cancer Neg Hx      Social History:   reports that she has never smoked. She has never used smokeless tobacco. She reports that she does not currently use alcohol. She reports that she does not use drugs.  Medications No current facility-administered medications for this encounter.  Current Outpatient Medications:    cefdinir (OMNICEF) 300 MG capsule, Take 2 capsules (600 mg total) by  mouth daily., Disp: 14 capsule, Rfl: 0   docusate sodium (COLACE) 100 MG capsule, Take 1 capsule (100 mg total) by mouth 2 (two) times daily., Disp: 10 capsule, Rfl: 0   fluconazole (DIFLUCAN) 100 MG tablet, Take 1 tablet (100 mg total) by mouth daily., Disp: 30 tablet, Rfl: 1   hydrocortisone (ANUSOL-HC) 2.5 % rectal cream, Place 1 Application rectally 2 (two) times daily., Disp: 28 g, Rfl: 1   lidocaine-prilocaine (EMLA) cream, Apply 1 Application topically See admin instructions. Apply a dime-size of cream to port-a-cath 1-2 hours prior to access. Cover with [employer]., Disp: , Rfl:    LORazepam (ATIVAN) 0.5 MG tablet, Take 1 tablet (0.5 mg total) by mouth every 6 (six) hours as  needed for anxiety., Disp: 30 tablet, Rfl: 0   melatonin 3 MG TABS tablet, Take 3 mg by mouth at bedtime as needed., Disp: , Rfl:    methylPREDNISolone (MEDROL DOSEPAK) 4 MG TBPK tablet, Take as directed, Disp: 1 each, Rfl: 0   ondansetron (ZOFRAN) 8 MG tablet, Take 1 tablet (8 mg total) by mouth every 8 (eight) hours as needed for nausea or vomiting., Disp: 30 tablet, Rfl: 1   pantoprazole (PROTONIX) 40 MG tablet, Take 1 tablet (40 mg total) by mouth 2 (two) times daily., Disp: 60 tablet, Rfl: 1   polyethylene glycol (MIRALAX / GLYCOLAX) 17 g packet, Take 17 g by mouth daily as needed for mild constipation., Disp: 14 each, Rfl: 0   predniSONE (DELTASONE) 10 MG tablet, Take 8 tablets (80 mg total) by mouth daily with breakfast., Disp: 120 tablet, Rfl: 1   prochlorperazine (COMPAZINE) 10 MG tablet, Take 1 tablet (10 mg total) by mouth every 6 (six) hours as needed for nausea or vomiting., Disp: 30 tablet, Rfl: 1   pyridoxine (NEURO-K-250 VITAMIN B6) 250 MG tablet, Take 2 tablets (500 mg total) by mouth daily., Disp: 60 tablet, Rfl: 6   traMADol (ULTRAM) 50 MG tablet, Take 50 mg by mouth every 6 (six) hours as needed (for pain)., Disp: , Rfl:    vancomycin (VANCOCIN) 125 MG capsule, Take 1 capsule (125 mg total) by mouth 4 (four) times daily., Disp: 40 capsule, Rfl: 0   vitamin B-12 (CYANOCOBALAMIN) 100 MCG tablet, Take 100 mcg by mouth daily., Disp: , Rfl:    Exam: Current vital signs: BP 105/71   Pulse 73   Temp 97.6 F (36.4 C) (Oral)   Resp 16   Ht 5\' 3"  (1.6 m)   Wt 51.7 kg   LMP 02/19/2022   SpO2 100%   BMI 20.19 kg/m  Vital signs in last 24 hours: Temp:  [97.6 F (36.4 C)] 97.6 F (36.4 C) (04/04 1030) Pulse Rate:  [71-80] 73 (04/04 1315) Resp:  [15-16] 16 (04/04 1315) BP: (105-122)/(67-75) 105/71 (04/04 1315) SpO2:  [100 %] 100 % (04/04 1315) Weight:  [51.7 kg] 51.7 kg (04/04 1027)  GENERAL: Awake, alert in NAD HEENT: - Normocephalic and atraumatic, dry mm LUNGS - Clear  to auscultation bilaterally with no wheezes CV - S1S2 RRR, no m/r/g, equal pulses bilaterally. ABDOMEN - Soft, nontender, nondistended with normoactive BS Ext: warm, well perfused, intact peripheral pulses, no edema  NEURO:  Mental Status: AA&Ox4 Language: speech is clear.  Naming, repetition, fluency, and comprehension intact. Cranial Nerves: PERRL EOMI, visual fields full, no facial asymmetry, facial sensation intact, hearing intact, tongue/uvula/soft palate midline, normal sternocleidomastoid and trapezius muscle strength. No evidence of tongue atrophy or fibrillations Motor: 4/4 in all 4 extremities Tone:  is normal and bulk is normal Sensation- Intact to light touch bilaterally Coordination: FTN intact bilaterally, no ataxia in BLE. Gait- deferred  NIHSS 1a Level of Conscious.: 0 1b LOC Questions: 0 1c LOC Commands: 0 2 Best Gaze: 0 3 Visual: 0 4 Facial Palsy: 0 5a Motor Arm - left: 0 5b Motor Arm - Right: 0 6a Motor Leg - Left: 0 6b Motor Leg - Right: 0 7 Limb Ataxia: 0 8 Sensory: 0 9 Best Language: 0 10 Dysarthria: 0 11 Extinct. and Inatten.: 0 TOTAL: 0   Labs I have reviewed labs in epic and the results pertinent to this consultation are:  CBC    Component Value Date/Time   WBC 6.8 04/28/2022 1031   RBC 2.46 (L) 04/28/2022 1031   HGB 7.2 (L) 04/28/2022 1031   HGB 9.0 (L) 04/25/2022 1030   HGB 12.0 01/11/2017 1514   HCT 22.5 (L) 04/28/2022 1031   HCT 36.7 01/11/2017 1514   PLT 164 04/28/2022 1031   PLT 186 04/25/2022 1030   PLT 313 01/11/2017 1514   MCV 91.5 04/28/2022 1031   MCV 89 01/11/2017 1514   MCH 29.3 04/28/2022 1031   MCHC 32.0 04/28/2022 1031   RDW 23.2 (H) 04/28/2022 1031   RDW 12.7 01/11/2017 1514   LYMPHSABS 1.1 04/28/2022 1031   LYMPHSABS 2.9 01/11/2017 1514   MONOABS 0.1 04/28/2022 1031   EOSABS 0.0 04/28/2022 1031   EOSABS 0.2 01/11/2017 1514   BASOSABS 0.0 04/28/2022 1031   BASOSABS 0.0 01/11/2017 1514    CMP     Component  Value Date/Time   NA 130 (L) 04/28/2022 1031   K 3.8 04/28/2022 1031   CL 106 04/28/2022 1031   CO2 17 (L) 04/28/2022 1031   GLUCOSE 118 (H) 04/28/2022 1031   BUN 6 04/28/2022 1031   CREATININE 0.39 (L) 04/28/2022 1031   CREATININE 0.38 (L) 04/25/2022 1030   CREATININE 0.62 08/20/2012 1139   CALCIUM 7.8 (L) 04/28/2022 1031   PROT 6.0 (L) 04/28/2022 1031   ALBUMIN 3.0 (L) 04/28/2022 1031   AST 252 (H) 04/28/2022 1031   AST 115 (H) 04/25/2022 1030   ALT 355 (H) 04/28/2022 1031   ALT 206 (H) 04/25/2022 1030   ALKPHOS 984 (H) 04/28/2022 1031   BILITOT 1.6 (H) 04/28/2022 1031   BILITOT 0.8 04/25/2022 1030   GFRNONAA >60 04/28/2022 1031   GFRNONAA >60 04/25/2022 1030   GFRAA >60 04/18/2015 0433    Lipid Panel     Component Value Date/Time   CHOL 133 05/04/2021 1416   TRIG 139.0 05/04/2021 1416   HDL 53.70 05/04/2021 1416   CHOLHDL 2 05/04/2021 1416   VLDL 27.8 05/04/2021 1416   LDLCALC 52 05/04/2021 1416      Imaging I have reviewed the images obtained:  MRI examination of the brain  -1.4 cm focus of enhancement within or along the right postcentral sulcus with adjacent cortical edema. 1.3 cm focus of enhancement within or along the medial left parietal lobe with adjacent cortical edema. While these may reflect subacute infarcts, leptomeningeal metastatic disease cannot be excluded. - Multiple foci of signal abnormality within the bilateral frontoparietal lobes measuring up to 2.3 cm, likely reflecting infarcts of various age. Foci within the anterior right frontal lobe white matter, posterior left frontal lobe white matter and left occipital cortex are most compatible with acute infarcts.   Assessment:  37 y.o. female with past medical history of metastatic breast cancer undergoing chemotherapy, Crohns disease s/p hemicolectomy with  recent C diff infection, B12 deficiency, vit d deficiency, GERD, small bowel obstruction who presents to the Ed for evaluation of persistent  headache and vomiting.   Multiple acute/ subacute infarcts in bilateral frontoparietal lobes and occipital  Concern for leptomeningeal metastatic disease  Recommendations: - Recommend LP. Obtain CSF labs to include CSF cell count, protein and glucose, CSF culture with gram stain and CSF cytology  - HgbA1c, fasting lipid panel - Frequent neuro checks - Echocardiogram - CTA head and neck - Prophylactic therapy-Antiplatelet med: Aspirin - dose 81mg   - Risk factor modification - Telemetry monitoring - PT consult, OT consult, Speech consult - General neuro to follow.   Beulah Gandy DNP, ACNPC-AG  [facility]   I have seen the patient reviewed the above note.  She presents with headache and blurred vision which could possibly be explained by the infarcts on MRI, however given her history of cancer, I do think that further evaluation with CSF sampling is warranted as recommended in the MRI results.  She will need a embolic stroke workup, though this certainly could be related to her underlying cancer in the form of hypercoagulability.  We will continue to follow.  Roland Rack, MD [facility] [phone]  If 7pm- 7am, please page neurology on call as listed in [place].

## 2022-04-28 NOTE — Telephone Encounter (Signed)
Call received from patients husband Shanon Brow to front office requesting to bring pt in today for IVF's.  Dr. Marin Olp notified and order received to bring pt in for IVF's today.  Call placed back to [facility] per scheduling. Pt scheduled for 0930 for IVF's.  Dr. Marin Olp aware.

## 2022-04-28 NOTE — ED Provider Notes (Signed)
[facility] Provider Note   CSN: [id number] Arrival date & time: 04/28/22  [id number]     History  Chief Complaint  Patient presents with   Emesis   Dizziness    Carla Clark is a 37 y.o. female.  HPI Carla Clark is a 37 y.o. female with PMH Crohn's status post hemicolectomy, recent C. difficile diagnosis, metastatic breast cancer currently getting chemo with Dr. Marin Olp presenting to the ED for headache and vomiting.  She has had headaches, nausea since initiating chemotherapy, last session of which was 3 days ago.  She was seen and evaluated 2 days ago at our affiliated ED.  She now presents with persistent headache and concern for vomiting, anorexia.  She complains of possible new blurry vision bilaterally.  No new focal weakness.  She did not previously get headaches though she has had some small receiving chemotherapy.   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     Home Medications Prior to Admission medications   Medication Sig Start Date End Date Taking? Authorizing Provider  cefdinir (OMNICEF) 300 MG capsule Take 2 capsules (600 mg total) by mouth daily. 03/28/22   Celso Amy, NP  docusate sodium (COLACE) 100 MG capsule Take 1 capsule (100 mg total) by mouth 2 (two) times daily. 03/11/22   Swayze, Ava, DO  fluconazole (DIFLUCAN) 100 MG tablet Take 1 tablet (100 mg total) by mouth daily. 03/28/22   Celso Amy, NP  hydrocortisone (ANUSOL-HC) 2.5 % rectal cream Place 1 Application rectally 2 (two) times daily. 11/10/21   Pyrtle, Lajuan Lines, MD  lidocaine-prilocaine (EMLA) cream Apply 1 Application topically See admin instructions. Apply a dime-size of cream to port-a-cath 1-2 hours prior to access. Cover with [employer]. 02/14/22   [provider]  LORazepam  (ATIVAN) 0.5 MG tablet Take 1 tablet (0.5 mg total) by mouth every 6 (six) hours as needed for anxiety. 04/22/22 05/22/22  Volanda Napoleon, MD  melatonin 3 MG TABS tablet Take 3 mg by mouth at bedtime as needed.    [provider]  methylPREDNISolone (MEDROL DOSEPAK) 4 MG TBPK tablet Take as directed 04/18/22   Volanda Napoleon, MD  ondansetron (ZOFRAN) 8 MG tablet Take 1 tablet (8 mg total) by mouth every 8 (eight) hours as needed for nausea or vomiting. 03/01/22   Volanda Napoleon, MD  pantoprazole (PROTONIX) 40 MG tablet Take 1 tablet (40 mg total) by mouth 2 (two) times daily. 03/28/22   Celso Amy, NP  polyethylene glycol (MIRALAX / GLYCOLAX) 17 g packet Take 17 g by mouth daily as needed for mild constipation. 03/11/22   Swayze, Ava, DO  predniSONE (DELTASONE) 10 MG tablet Take 8 tablets (80 mg total) by mouth daily with breakfast. 03/28/22   Celso Amy, NP  prochlorperazine (COMPAZINE) 10 MG tablet Take 1 tablet (10 mg total) by mouth every 6 (six) hours as needed for nausea or vomiting. 03/01/22   Volanda Napoleon, MD  pyridoxine (NEURO-K-250 VITAMIN B6) 250 MG tablet Take 2 tablets (500 mg total) by mouth daily. 02/24/22   Volanda Napoleon, MD  traMADol (ULTRAM) 50 MG tablet Take 50 mg by mouth every 6 (six) hours as needed (for pain).    [provider]  vancomycin Zollie Pee)  125 MG capsule Take 1 capsule (125 mg total) by mouth 4 (four) times daily. 04/15/22   Pyrtle, Lajuan Lines, MD  vitamin B-12 (CYANOCOBALAMIN) 100 MCG tablet Take 100 mcg by mouth daily.    [provider]      Allergies    Other    Review of Systems   Review of Systems  All other systems reviewed and are negative.   Physical Exam Updated Vital Signs BP 109/69   Pulse 74   Temp 97.6 F (36.4 C) (Oral)   Resp 14   Ht 5\' 3"  (1.6 m)   Wt 51.7 kg   LMP 02/19/2022   SpO2 100%   BMI 20.19 kg/m  Physical Exam Vitals and nursing note reviewed.  Constitutional:      General: She is not in  acute distress.    Appearance: She is well-developed.  HENT:     Head: Normocephalic and atraumatic.  Eyes:     Conjunctiva/sclera: Conjunctivae normal.  Cardiovascular:     Rate and Rhythm: Normal rate and regular rhythm.  Pulmonary:     Effort: Pulmonary effort is normal. No respiratory distress.     Breath sounds: Normal breath sounds. No stridor.  Chest:    Abdominal:     General: There is no distension.  Skin:    General: Skin is warm and dry.  Neurological:     Mental Status: She is alert and oriented to person, place, and time.     Cranial Nerves: No cranial nerve deficit.     Comments: Subjective blurry vision otherwise neuroexam initially unremarkable  Psychiatric:        Mood and Affect: Mood normal.     ED Results / Procedures / Treatments   Labs (all labs ordered are listed, but only abnormal results are displayed) Labs Reviewed  COMPREHENSIVE METABOLIC PANEL - Abnormal; Notable for the following components:      Result Value   Sodium 130 (*)    CO2 17 (*)    Glucose, Bld 118 (*)    Creatinine, Ser 0.39 (*)    Calcium 7.8 (*)    Total Protein 6.0 (*)    Albumin 3.0 (*)    AST 252 (*)    ALT 355 (*)    Alkaline Phosphatase 984 (*)    Total Bilirubin 1.6 (*)    All other components within normal limits  CBC WITH DIFFERENTIAL/PLATELET - Abnormal; Notable for the following components:   RBC 2.46 (*)    Hemoglobin 7.2 (*)    HCT 22.5 (*)    RDW 23.2 (*)    All other components within normal limits  CSF CULTURE W GRAM STAIN  MAGNESIUM  CSF CELL COUNT WITH DIFFERENTIAL  PROTEIN AND GLUCOSE, CSF  HEMOGLOBIN A1C  I-STAT BETA HCG BLOOD, ED ([facility], [facility], [facility] ONLY)  CYTOLOGY - NON PAP    EKG None  Radiology MR Brain W and Wo Contrast  Result Date: 04/28/2022 CLINICAL DATA:  Provided history: Headache, increasing frequency or severity. Additional history obtained from [facility] breast cancer. EXAM: MRI HEAD WITHOUT AND WITH CONTRAST  TECHNIQUE: Multiplanar, multiecho pulse sequences of the brain and surrounding structures were obtained without and with intravenous contrast. CONTRAST:  9mL GADAVIST GADOBUTROL 1 MMOL/ML IV SOLN COMPARISON:  Head CT 04/26/2022.  Brain MRI 03/07/2022. FINDINGS: Brain: No age advanced or lobar predominant parenchymal atrophy. 1.4 cm focus of enhancement within or along the right postcentral sulcus with adjacent cortical edema (for  instance as seen on series 10, images 41-45) (series 12, image 12) (series 6, image 29). 1.3 cm focus of enhancement within or along the inferomedial left parietal lobe with adjacent cortical edema (for instance as seen on series 10, image 34) (series 12, image 10). Foci of diffusion-weighted and T2 FLAIR hyperintense signal abnormality within the bilateral frontoparietal white matter measuring up to 2.3 cm. There is T2 shine through at the majority of these sites. However, there is a 7 mm focus within the anterior right frontal lobe white matter which demonstrates true restricted diffusion (series 2, image 33) (series 250, image 33), and a 6 mm focus within the posterior left frontal lobe white matter which demonstrates true restricted diffusion. Additionally, there is a 3 mm focus of restricted diffusion and T2 FLAIR hyperintense signal abnormality within the left occipital cortex (series 2, image 30). There is no corresponding enhancement at these sites. These are favored to reflect infarcts, with the foci in the anterior right frontal lobe white matter, posterior left frontal lobe white matter and left occipital cortex being acute, and the remainder subacute to chronic. Mildly prominent enhancement along the bilateral seventh and eighth cranial nerves within the internal auditory canals (series 10, images 16 and 17). Chronic microhemorrhage within the right parietal lobe (series 7, image 86). A previously demonstrated right subdural hematoma is no longer present. No extra-axial fluid  collection. No midline shift. Vascular: Maintained flow voids within the proximal large arterial vessels. Skull and upper cervical spine: No suspicious marrow lesion. Sinuses/Orbits: No mass or acute finding within the imaged orbits. No significant paranasal sinus disease. IMPRESSION: 1. 1.4 cm focus of enhancement within or along the right postcentral sulcus with adjacent cortical edema. 1.3 cm focus of enhancement within or along the medial left parietal lobe with adjacent cortical edema. While these may reflect subacute infarcts, leptomeningeal metastatic disease cannot be excluded. Correlation with CSF analysis and MR imaging of the spine should be considered. Additionally, a short-interval follow-up brain MRI (without and with contrast) is recommended in 6-8 weeks. 2. Multiple foci of signal abnormality within the bilateral frontoparietal lobes measuring up to 2.3 cm, likely reflecting infarcts of various age. Foci within the anterior right frontal lobe white matter, posterior left frontal lobe white matter and left occipital cortex are most compatible with acute infarcts. The remainder are likely subacute to chronic. Findings are suspicious for an embolic process. 3. Mildly prominent enhancement along the bilateral seventh and eighth cranial nerves within the internal auditory canals. Findings are indeterminate for leptomeningeal metastatic disease versus prominent vascular enhancement. Consider utilizing an internal auditory canal protocol at the time of brain MRI follow-up. 4. Chronic microhemorrhage within the right parietal lobe. 5. Previously demonstrated right subdural hematoma no longer present. Electronically Signed   By: Kellie Simmering D.O.   On: 04/28/2022 12:43   CT Head Wo Contrast  Result Date: 04/26/2022 CLINICAL DATA:  Subdural hematoma, headache EXAM: CT HEAD WITHOUT CONTRAST TECHNIQUE: Contiguous axial images were obtained from the base of the skull through the vertex without intravenous  contrast. RADIATION DOSE REDUCTION: This exam was performed according to the departmental dose-optimization program which includes automated exposure control, adjustment of the mA and/or kV according to patient size and/or use of iterative reconstruction technique. COMPARISON:  03/07/2022 FINDINGS: Brain: Normal anatomic configuration of the brain. There is interval development of a small focus of cortical hypodensity with effacement of the gray-white matter differentiation involving the right perirolandic frontoparietal cortex at the vertex best seen on  axial image # 23 and sagittal image # 18. This is nonspecific though a small cortical infarct could appear in this fashion. No significant associated abnormal mass effect. No midline shift. No abnormal intra or extra-axial mass lesion or fluid collection. Previously noted right subdural hematoma is not visualized, however, this was not visualized on the prior examination as well; no acute intracranial hemorrhage identified. Ventricular size is normal. Cerebellum is unremarkable. Vascular: Unremarkable Skull: Intact Sinuses/Orbits: Paranasal sinuses are clear. Orbits are unremarkable. Other: Mastoid air cells and middle ear cavities are clear. IMPRESSION: 1. Interval development of a small focus of cortical hypodensity with effacement of the gray-white matter differentiation involving the right perirolandic frontoparietal cortex at the vertex. This is nonspecific though a small cortical infarct could appear in this fashion. Contrast enhanced MRI examination is recommended for further evaluation. 2. Presumed resolution of previously noted subdural hematoma. No acute intracranial hemorrhage identified. Electronically Signed   By: Fidela Salisbury M.D.   On: 04/26/2022 21:16    Procedures Procedures    Medications Ordered in ED Medications  lidocaine (PF) (XYLOCAINE) 1 % injection 5 mL (has no administration in time range)   stroke: early stages of recovery book  (has no administration in time range)  sodium chloride 0.9 % bolus 1,000 mL (0 mLs Intravenous Stopped 04/28/22 1141)  prochlorperazine (COMPAZINE) injection 10 mg (10 mg Intravenous Given 04/28/22 1040)  ketorolac (TORADOL) 15 MG/ML injection 15 mg (15 mg Intravenous Given 04/28/22 1040)  gadobutrol (GADAVIST) 1 MMOL/ML injection 5 mL (5 mLs Intravenous Contrast Given 04/28/22 1147)    ED Course/ Medical Decision Making/ A&P                             Medical Decision Making Adult female with ongoing therapy for metastatic breast cancer presents with headache, nausea, vomiting, possible blurry vision.  Differential includes chemotherapy related illness, dehydration, electrolyte disturbance, progression of disease.  On reviewing the patient's chart clear her oncologist has planned MRI ordered yesterday, this will be facilitated today, while she received fluids, has labs, is monitored.   Amount and/or Complexity of Data Reviewed External Data Reviewed: notes.    Details: As above chart reviewed, notable for metastatic breast cancer with ongoing chemotherapy efforts, possible prior head bleed, PET scan from earlier this year reviewed, included below. Labs: ordered. Decision-making details documented in ED Course. Radiology: ordered and independent interpretation performed. Decision-making details documented in ED Course.  Risk Prescription drug management. Decision regarding hospitalization.  PET IMPRESSION: 1. 3.5 cm hypermetabolic left breast mass consistent with known malignancy. 2. Hypermetabolic left subclavicular/subpectoral lymph node. 3. No findings for pulmonary metastatic disease. 4. Suspect segment 6 liver lesion and celiac axis adenopathy. Recommend MRI abdomen with and without contrast for further evaluation (if clinically necessary). 5. Diffuse osseous metastatic disease. No pathologic fracture or spinal canal compromise. 2:41 PM I discussed the patient's findings with our  neurology and oncology teams.  Specifically discussed the patient's findings today MRI abnormalities, labs with her oncologist, Dr. Rondel Oh.  We discussed findings concerning for leptomeningeal spread, possible metastatic lesions as well as micro emboli.  Given these concerns the patient will have lumbar puncture.  I discussed this with our interventional radiology team to ensure that we could facilitate LP and this was performed without complication, results pending. Given concern for progression of disease, embolic stroke, I also discussed her case with our hospitalist colleagues for admission.  In essence this adult female  with metastatic breast cancer presents with nausea, vomiting, possibly 1 episode of hematemesis as well as blurry vision.  Patient found to have evidence for new leptomeningeal disease, micro emboli/stroke.  Labs most notable for progression of hepatic dysfunction as well as slight decrease in hemoglobin value.  Patient did not have any additional vomiting in the ED, and subjective symptoms actually improved with fluids, antiemetics.  Some suspicion for gastric irritation contributing to her episode of hematemesis, and with a soft, nonperitoneal abdomen, no melena, no additional CT imaging of the abdomen currently indicated. After conversations as above with neurology, oncology, and internal medicine as well as radiology patient was admitted for further monitoring, management.        Final Clinical Impression(s) / ED Diagnoses Final diagnoses:  Hematemesis with nausea  Acute CVA (cerebrovascular accident)  Leptomeningeal disease  CRITICAL CARE Performed by: Carmin Muskrat Total critical care time: 40 minutes Critical care time was exclusive of separately billable procedures and treating other patients. Critical care was necessary to treat or prevent imminent or life-threatening deterioration. Critical care was time spent personally by me on the following activities:  development of treatment plan with patient and/or surrogate as well as nursing, discussions with consultants, evaluation of patient's response to treatment, examination of patient, obtaining history from patient or surrogate, ordering and performing treatments and interventions, ordering and review of laboratory studies, ordering and review of radiographic studies, pulse oximetry and re-evaluation of patient's condition.    Carmin Muskrat, MD 04/28/22 1444

## 2022-04-28 NOTE — ED Notes (Signed)
ED TO INPATIENT HANDOFF REPORT  ED Nurse Name and Phone #: [phone]  S Name/Age/Gender Carla Clark 37 y.o. female Room/Bed: 027C/027C  Code Status   Code Status: Prior  Home/SNF/Other Home Patient oriented to: self, place, time, and situation Is this baseline? Yes   Triage Complete: Triage complete  Chief Complaint Stroke [I63.9]  Triage Note Pt BIB[facility]EM[facility] from home after bloody emesis and  dizziness with blurry vision. Pulsating HA.   124/80 90 hr 100 o2 131 CBG   Allergies Allergies  Allergen Reactions   Other Other (See Comments)    History of Crohn's disease- NO seeds, tomatoes, anything that doesn't digest well, etc.....    Level of Care/Admitting Diagnosis ED Disposition     ED Disposition  Admit   Condition  --   [facility]: [facility] [[id number]]  Level of Care: Telemetry Medical [104]  May admit patient to [facility] or [facility] if equivalent level of care is available:: No  Covid Evaluation: Asymptomatic - no recent exposure (last 10 days) testing not required  Diagnosis: Stroke [id number]  Admitting Physician: Norval Morton [id number]  Attending Physician: Norval Morton [id number]  Certification:: I certify this patient will need inpatient services for at least 2 midnights  Estimated Length of Stay: 2          B Medical/Surgery History Past Medical History:  Diagnosis Date   Anal fissure    Anemia 11/2011   iron deficiency. 01/2013 parenteral iron. Intranasal B12.  Dr Marin Olp follows.    B12 deficiency 11/2012   Breast cancer    Breast cancer metastasized to multiple sites, left 02/02/2022   Breast mass in female 04/21/2012   Cervical cancer screening 01/27/2012   Chicken pox as a child   Crohn's disease 2013   ileitis 10/2011. ileitis, mesenteric abscess, ? entero-enteric fistula on 08/2012 CT enterography   Dermatitis 08/22/2016   GERD (gastroesophageal reflux disease)    Hepatitis B immune  07/2012   previous vaccination.    Hives 09/2014   per Dermatologist.  Rx Zyrtec, Benadryl.    Ileitis    Serrated polyp of colon    Small bowel obstruction    SVD (spontaneous vaginal delivery) 08/13/2018   Vitamin D deficiency 2014   Past Surgical History:  Procedure Laterality Date   LAPAROSCOPIC RIGHT HEMI COLECTOMY  07/11/2019   WISDOM TOOTH EXTRACTION  37 yrs old     A IV Location/Drains/Wounds Patient Lines/Drains/Airways Status     Active Line/Drains/Airways     Name Placement date Placement time Site Days   Implanted Port 01/20/22 Right Chest 01/20/22  0957  Chest  98            Intake/Output Last 24 hours  Intake/Output Summary (Last 24 hours) at 04/28/2022 1512 Last data filed at 04/28/2022 1141 Gross per 24 hour  Intake 1000 ml  Output --  Net 1000 ml    Labs/Imaging Results for orders placed or performed during the hospital encounter of 04/28/22 (from the past 48 hour(s))  Comprehensive metabolic panel     Status: Abnormal   Collection Time: 04/28/22 10:31 AM  Result Value Ref Range   Sodium 130 (L) 135 - 145 mmol/L   Potassium 3.8 3.5 - 5.1 mmol/L   Chloride 106 98 - 111 mmol/L   CO2 17 (L) 22 - 32 mmol/L   Glucose, Bld 118 (H) 70 - 99 mg/dL    Comment: Glucose reference range applies only to  samples taken after fasting for at least 8 hours.   BUN 6 6 - 20 mg/dL   Creatinine, Ser 0.39 (L) 0.44 - 1.00 mg/dL   Calcium 7.8 (L) 8.9 - 10.3 mg/dL   Total Protein 6.0 (L) 6.5 - 8.1 g/dL   Albumin 3.0 (L) 3.5 - 5.0 g/dL   AST 252 (H) 15 - 41 U/L   ALT 355 (H) 0 - 44 U/L   Alkaline Phosphatase 984 (H) 38 - 126 U/L   Total Bilirubin 1.6 (H) 0.3 - 1.2 mg/dL   GFR, Estimated >60 >60 mL/min    Comment: (NOTE) Calculated using the CKD-EPI Creatinine Equation (2021)    Anion gap 7 5 - 15    Comment: Performed at [facility] [address], [place], [place] [address]  CBC with Differential     Status: Abnormal   Collection Time: 04/28/22 10:31 AM   Result Value Ref Range   WBC 6.8 4.0 - 10.5 K/uL   RBC 2.46 (L) 3.87 - 5.11 MIL/uL   Hemoglobin 7.2 (L) 12.0 - 15.0 g/dL   HCT 22.5 (L) 36.0 - 46.0 %   MCV 91.5 80.0 - 100.0 fL   MCH 29.3 26.0 - 34.0 pg   MCHC 32.0 30.0 - 36.0 g/dL   RDW 23.2 (H) 11.5 - 15.5 %   Platelets 164 150 - 400 K/uL   nRBC 0.0 0.0 - 0.2 %   Neutrophils Relative % 82 %   Neutro Abs 5.6 1.7 - 7.7 K/uL   Lymphocytes Relative 16 %   Lymphs Abs 1.1 0.7 - 4.0 K/uL   Monocytes Relative 1 %   Monocytes Absolute 0.1 0.1 - 1.0 K/uL   Eosinophils Relative 0 %   Eosinophils Absolute 0.0 0.0 - 0.5 K/uL   Basophils Relative 0 %   Basophils Absolute 0.0 0.0 - 0.1 K/uL   WBC Morphology MORPHOLOGY UNREMARKABLE    RBC Morphology MORPHOLOGY UNREMARKABLE    Smear Review PLATELETS APPEAR ADEQUATE    Immature Granulocytes 1 %   Abs Immature Granulocytes 0.05 0.00 - 0.07 K/uL    Comment: Performed at [facility], [place] [address], [place], [place] [address]  Magnesium     Status: None   Collection Time: 04/28/22 10:31 AM  Result Value Ref Range   Magnesium 2.0 1.7 - 2.4 mg/dL    Comment: Performed at [facility] [address] [place], [place] [address]  I-Stat beta hCG blood, ED     Status: None   Collection Time: 04/28/22 10:39 AM  Result Value Ref Range   I-stat hCG, quantitative <5.0 <5 mIU/mL   Comment 3            Comment:   GEST. AGE      CONC.  (mIU/mL)   <=1 WEEK        5 - 50     2 WEEKS       50 - 500     3 WEEKS       100 - 10,000     4 WEEKS     1,000 - 30,000        FEMALE AND NON-PREGNANT FEMALE:     LESS THAN 5 mIU/mL    MR Brain W and Wo Contrast  Result Date: 04/28/2022 CLINICAL DATA:  Provided history: Headache, increasing frequency or severity. Additional history obtained from [facility] breast cancer. EXAM: MRI HEAD WITHOUT AND WITH CONTRAST TECHNIQUE: Multiplanar,  multiecho pulse sequences of the brain and surrounding structures were obtained without and  with intravenous contrast. CONTRAST:  62mL GADAVIST GADOBUTROL 1 MMOL/ML IV SOLN COMPARISON:  Head CT 04/26/2022.  Brain MRI 03/07/2022. FINDINGS: Brain: No age advanced or lobar predominant parenchymal atrophy. 1.4 cm focus of enhancement within or along the right postcentral sulcus with adjacent cortical edema (for instance as seen on series 10, images 41-45) (series 12, image 12) (series 6, image 29). 1.3 cm focus of enhancement within or along the inferomedial left parietal lobe with adjacent cortical edema (for instance as seen on series 10, image 34) (series 12, image 10). Foci of diffusion-weighted and T2 FLAIR hyperintense signal abnormality within the bilateral frontoparietal white matter measuring up to 2.3 cm. There is T2 shine through at the majority of these sites. However, there is a 7 mm focus within the anterior right frontal lobe white matter which demonstrates true restricted diffusion (series 2, image 33) (series 250, image 33), and a 6 mm focus within the posterior left frontal lobe white matter which demonstrates true restricted diffusion. Additionally, there is a 3 mm focus of restricted diffusion and T2 FLAIR hyperintense signal abnormality within the left occipital cortex (series 2, image 30). There is no corresponding enhancement at these sites. These are favored to reflect infarcts, with the foci in the anterior right frontal lobe white matter, posterior left frontal lobe white matter and left occipital cortex being acute, and the remainder subacute to chronic. Mildly prominent enhancement along the bilateral seventh and eighth cranial nerves within the internal auditory canals (series 10, images 16 and 17). Chronic microhemorrhage within the right parietal lobe (series 7, image 86). A previously demonstrated right subdural hematoma is no longer present. No extra-axial fluid collection. No midline shift. Vascular: Maintained flow voids within the proximal large arterial vessels. Skull and  upper cervical spine: No suspicious marrow lesion. Sinuses/Orbits: No mass or acute finding within the imaged orbits. No significant paranasal sinus disease. IMPRESSION: 1. 1.4 cm focus of enhancement within or along the right postcentral sulcus with adjacent cortical edema. 1.3 cm focus of enhancement within or along the medial left parietal lobe with adjacent cortical edema. While these may reflect subacute infarcts, leptomeningeal metastatic disease cannot be excluded. Correlation with CSF analysis and MR imaging of the spine should be considered. Additionally, a short-interval follow-up brain MRI (without and with contrast) is recommended in 6-8 weeks. 2. Multiple foci of signal abnormality within the bilateral frontoparietal lobes measuring up to 2.3 cm, likely reflecting infarcts of various age. Foci within the anterior right frontal lobe white matter, posterior left frontal lobe white matter and left occipital cortex are most compatible with acute infarcts. The remainder are likely subacute to chronic. Findings are suspicious for an embolic process. 3. Mildly prominent enhancement along the bilateral seventh and eighth cranial nerves within the internal auditory canals. Findings are indeterminate for leptomeningeal metastatic disease versus prominent vascular enhancement. Consider utilizing an internal auditory canal protocol at the time of brain MRI follow-up. 4. Chronic microhemorrhage within the right parietal lobe. 5. Previously demonstrated right subdural hematoma no longer present. Electronically Signed   By: Kellie Simmering D.O.   On: 04/28/2022 12:43   CT Head Wo Contrast  Result Date: 04/26/2022 CLINICAL DATA:  Subdural hematoma, headache EXAM: CT HEAD WITHOUT CONTRAST TECHNIQUE: Contiguous axial images were obtained from the base of the skull through the vertex without intravenous contrast. RADIATION DOSE REDUCTION: This exam was performed according to the departmental dose-optimization program  which includes automated exposure control, adjustment of the mA and/or kV according to patient size and/or use of iterative reconstruction technique. COMPARISON:  03/07/2022 FINDINGS: Brain: Normal anatomic configuration of the brain. There is interval development of a small focus of cortical hypodensity with effacement of the gray-white matter differentiation involving the right perirolandic frontoparietal cortex at the vertex best seen on axial image # 23 and sagittal image # 18. This is nonspecific though a small cortical infarct could appear in this fashion. No significant associated abnormal mass effect. No midline shift. No abnormal intra or extra-axial mass lesion or fluid collection. Previously noted right subdural hematoma is not visualized, however, this was not visualized on the prior examination as well; no acute intracranial hemorrhage identified. Ventricular size is normal. Cerebellum is unremarkable. Vascular: Unremarkable Skull: Intact Sinuses/Orbits: Paranasal sinuses are clear. Orbits are unremarkable. Other: Mastoid air cells and middle ear cavities are clear. IMPRESSION: 1. Interval development of a small focus of cortical hypodensity with effacement of the gray-white matter differentiation involving the right perirolandic frontoparietal cortex at the vertex. This is nonspecific though a small cortical infarct could appear in this fashion. Contrast enhanced MRI examination is recommended for further evaluation. 2. Presumed resolution of previously noted subdural hematoma. No acute intracranial hemorrhage identified. Electronically Signed   By: Fidela Salisbury M.D.   On: 04/26/2022 21:16    Pending Labs Unresulted Labs (From admission, onward)     Start     Ordered   04/29/22 0500  Lipid panel  (Labs)  Tomorrow morning,   R       Comments: Fasting    04/28/22 1434   04/28/22 1434  Hemoglobin A1c  (Labs)  Once,   URGENT       Comments: To assess prior glycemic control    04/28/22 1434    04/28/22 1422  CSF cell count with differential  Once,   STAT       Question:  Are there also cytology or pathology orders on this specimen?  Answer:  Yes   04/28/22 1421   04/28/22 1422  CSF culture w Gram Stain  Once,   URGENT       Question Answer Comment  Are there also cytology or pathology orders on this specimen? Yes   Patient immune status Immunocompromised      04/28/22 1421   04/28/22 1422  Protein and glucose, CSF  Once,   STAT        04/28/22 1421            Vitals/Pain Today's Vitals   04/28/22 1235 04/28/22 1238 04/28/22 1315 04/28/22 1330  BP: 109/70  105/71 109/69  Pulse: 80  73 74  Resp: 16 16 16 14   Temp:      TempSrc:      SpO2: 100%  100% 100%  Weight:      Height:      PainSc:  0-No pain      Isolation Precautions No active isolations  Medications Medications   stroke: early stages of recovery book (has no administration in time range)  sodium chloride 0.9 % bolus 1,000 mL (0 mLs Intravenous Stopped 04/28/22 1141)  prochlorperazine (COMPAZINE) injection 10 mg (10 mg Intravenous Given 04/28/22 1040)  ketorolac (TORADOL) 15 MG/ML injection 15 mg (15 mg Intravenous Given 04/28/22 1040)  gadobutrol (GADAVIST) 1 MMOL/ML injection 5 mL (5 mLs Intravenous Contrast Given 04/28/22 1147)  lidocaine (PF) (XYLOCAINE) 1 % injection 5 mL (5 mLs Intradermal Given 04/28/22 1446)  Mobility walks     Focused Assessments Neuro Assessment Handoff:  Swallow screen pass? Yes   NIH Stroke Scale  Dizziness Present: No Headache Present: Yes Interval: Initial Level of Consciousness (1a.)   : Alert, keenly responsive LOC Questions (1b. )   : Answers both questions correctly LOC Commands (1c. )   : Performs both tasks correctly Best Gaze (2. )  : Normal Visual (3. )  : No visual loss Facial Palsy (4. )    : Normal symmetrical movements Motor Arm, Left (5a. )   : No drift Motor Arm, Right (5b. ) : No drift Motor Leg, Left (6a. )  : No drift Motor Leg, Right (6b.  ) : No drift Limb Ataxia (7. ): Absent Sensory (8. )  : Normal, no sensory loss Best Language (9. )  : No aphasia Dysarthria (10. ): Normal Extinction/Inattention (11.)   : No Abnormality Complete NIHSS TOTAL: 0     Neuro Assessment:   Neuro Checks:   Initial (04/28/22 1000)  Has TPA been given? No If patient is a Neuro Trauma and patient is going to OR before floor call report to [place] nurse: [phone] or [phone]   R Recommendations: See Admitting Provider Note  Report given to:   Additional Notes:

## 2022-04-28 NOTE — Progress Notes (Signed)
Pharmacy Antibiotic Note  Carla Clark is a 37 y.o. female admitted on 04/28/2022 with CVA concerning for meningitis.  Pharmacy has been consulted for vancomycin and ampicillin dosing.  CrCl 80-90 mL/min  Plan: Vancomycin 1250mg  once then 750mg  Q12H (eAUC 557, Scr 0.8 rounded up, Vd 0.72 - expected trough 15.7 mcg/mL Ampicillin 2g Q4H Ceftriaxone 2g Q12H  Height: 5\' 3"  (160 cm) Weight: 51.7 kg (114 lb) IBW/kg (Calculated) : 52.4  Temp (24hrs), Avg:97.6 F (36.4 C), Min:97.5 F (36.4 C), Max:97.6 F (36.4 C)  Recent Labs  Lab 04/25/22 1030 04/26/22 2042 04/28/22 1031  WBC 6.2 7.3 6.8  CREATININE 0.38* 0.39* 0.39*    Estimated Creatinine Clearance: 80.9 mL/min (A) (by C-G formula based on SCr of 0.39 mg/dL (L)).    Allergies  Allergen Reactions   Other Other (See Comments)    History of Crohn's disease- NO seeds, tomatoes, anything that doesn't digest well, etc.....    Antimicrobials this admission: Vancomycin 4/4 >> Ceftriaxone 4/4 >> Ampicillin 4/4 >>  Microbiology results: 4/4 CSF: pending  Thank you for allowing pharmacy to be a part of this patient's care.  Merrilee Jansky, PharmD Clinical Pharmacist 04/28/2022 4:51 PM

## 2022-04-28 NOTE — Procedures (Signed)
PROCEDURE SUMMARY:  Successful fluoro-guided LP at L4-L5 from the right.  Yielded 66mL of clear, colorless fluid. OP 33 mL H2O No immediate complications.  Pt tolerated well.   Specimen was sent for labs.  EBL < 72mL  Docia Barrier PA-C 04/28/2022 2:40 PM

## 2022-04-28 NOTE — ED Notes (Signed)
Dr. Smith at bedside.

## 2022-04-29 ENCOUNTER — Inpatient Hospital Stay (HOSPITAL_COMMUNITY): Payer: Commercial Managed Care - PPO

## 2022-04-29 ENCOUNTER — Other Ambulatory Visit: Payer: Self-pay

## 2022-04-29 DIAGNOSIS — R945 Abnormal results of liver function studies: Secondary | ICD-10-CM | POA: Diagnosis not present

## 2022-04-29 DIAGNOSIS — R0602 Shortness of breath: Secondary | ICD-10-CM

## 2022-04-29 DIAGNOSIS — D649 Anemia, unspecified: Secondary | ICD-10-CM | POA: Diagnosis not present

## 2022-04-29 DIAGNOSIS — R110 Nausea: Secondary | ICD-10-CM | POA: Diagnosis not present

## 2022-04-29 DIAGNOSIS — R040 Epistaxis: Secondary | ICD-10-CM | POA: Diagnosis not present

## 2022-04-29 DIAGNOSIS — C50412 Malignant neoplasm of upper-outer quadrant of left female breast: Secondary | ICD-10-CM

## 2022-04-29 DIAGNOSIS — D6859 Other primary thrombophilia: Secondary | ICD-10-CM

## 2022-04-29 DIAGNOSIS — R519 Headache, unspecified: Secondary | ICD-10-CM | POA: Diagnosis not present

## 2022-04-29 DIAGNOSIS — D519 Vitamin B12 deficiency anemia, unspecified: Secondary | ICD-10-CM

## 2022-04-29 DIAGNOSIS — R7989 Other specified abnormal findings of blood chemistry: Secondary | ICD-10-CM

## 2022-04-29 DIAGNOSIS — I639 Cerebral infarction, unspecified: Secondary | ICD-10-CM | POA: Diagnosis not present

## 2022-04-29 DIAGNOSIS — K50913 Crohn's disease, unspecified, with fistula: Secondary | ICD-10-CM

## 2022-04-29 DIAGNOSIS — I6389 Other cerebral infarction: Secondary | ICD-10-CM | POA: Diagnosis not present

## 2022-04-29 DIAGNOSIS — M7989 Other specified soft tissue disorders: Secondary | ICD-10-CM

## 2022-04-29 DIAGNOSIS — R42 Dizziness and giddiness: Secondary | ICD-10-CM | POA: Diagnosis not present

## 2022-04-29 DIAGNOSIS — G96198 Other disorders of meninges, not elsewhere classified: Secondary | ICD-10-CM

## 2022-04-29 DIAGNOSIS — A0472 Enterocolitis due to Clostridium difficile, not specified as recurrent: Secondary | ICD-10-CM

## 2022-04-29 DIAGNOSIS — K920 Hematemesis: Principal | ICD-10-CM

## 2022-04-29 DIAGNOSIS — C50919 Malignant neoplasm of unspecified site of unspecified female breast: Secondary | ICD-10-CM | POA: Diagnosis not present

## 2022-04-29 DIAGNOSIS — Z7901 Long term (current) use of anticoagulants: Secondary | ICD-10-CM | POA: Diagnosis not present

## 2022-04-29 DIAGNOSIS — C7951 Secondary malignant neoplasm of bone: Secondary | ICD-10-CM

## 2022-04-29 DIAGNOSIS — K50912 Crohn's disease, unspecified, with intestinal obstruction: Secondary | ICD-10-CM

## 2022-04-29 DIAGNOSIS — I38 Endocarditis, valve unspecified: Secondary | ICD-10-CM

## 2022-04-29 LAB — COMPREHENSIVE METABOLIC PANEL
ALT: 402 U/L — ABNORMAL HIGH (ref 0–44)
AST: 312 U/L — ABNORMAL HIGH (ref 15–41)
Albumin: 2.4 g/dL — ABNORMAL LOW (ref 3.5–5.0)
Alkaline Phosphatase: 887 U/L — ABNORMAL HIGH (ref 38–126)
Anion gap: 8 (ref 5–15)
BUN: 6 mg/dL (ref 6–20)
CO2: 16 mmol/L — ABNORMAL LOW (ref 22–32)
Calcium: 6.8 mg/dL — ABNORMAL LOW (ref 8.9–10.3)
Chloride: 109 mmol/L (ref 98–111)
Creatinine, Ser: 0.36 mg/dL — ABNORMAL LOW (ref 0.44–1.00)
GFR, Estimated: >60 mL/min (ref >60)
Glucose, Bld: 97 mg/dL (ref 70–99)
Potassium: 3.3 mmol/L — ABNORMAL LOW (ref 3.5–5.1)
Sodium: 133 mmol/L — ABNORMAL LOW (ref 135–145)
Total Bilirubin: 1.5 mg/dL — ABNORMAL HIGH (ref 0.3–1.2)
Total Protein: 5.2 g/dL — ABNORMAL LOW (ref 6.5–8.1)

## 2022-04-29 LAB — CBC WITH DIFFERENTIAL/PLATELET
Abs Immature Granulocytes: 0 10*3/uL (ref 0.00–0.07)
Basophils Absolute: 0.1 10*3/uL (ref 0.0–0.1)
Basophils Relative: 3 %
Eosinophils Absolute: 0 10*3/uL (ref 0.0–0.5)
Eosinophils Relative: 0 %
HCT: 24.9 % — ABNORMAL LOW (ref 36.0–46.0)
Hemoglobin: 8 g/dL — ABNORMAL LOW (ref 12.0–15.0)
Lymphocytes Relative: 11 %
Lymphs Abs: 0.4 10*3/uL — ABNORMAL LOW (ref 0.7–4.0)
MCH: 29.6 pg (ref 26.0–34.0)
MCHC: 32.1 g/dL (ref 30.0–36.0)
MCV: 92.2 fL (ref 80.0–100.0)
Monocytes Absolute: 0 10*3/uL — ABNORMAL LOW (ref 0.1–1.0)
Monocytes Relative: 0 %
Myelocytes: 1 %
Neutro Abs: 3.2 10*3/uL (ref 1.7–7.7)
Neutrophils Relative %: 85 %
Platelets: 138 10*3/uL — ABNORMAL LOW (ref 150–400)
RBC: 2.7 MIL/uL — ABNORMAL LOW (ref 3.87–5.11)
RDW: 23.7 % — ABNORMAL HIGH (ref 11.5–15.5)
WBC: 3.8 10*3/uL — ABNORMAL LOW (ref 4.0–10.5)
nRBC: 0 % (ref 0.0–0.2)
nRBC: 0 /100 WBC

## 2022-04-29 LAB — ECHOCARDIOGRAM COMPLETE
Area-P 1/2: 3.13 cm2
Calc EF: 70.9 %
Height: 63 in
MV M vel: 5.49 m/s
MV Peak grad: 120.6 mmHg
Radius: 0.3 cm
S' Lateral: 2.7 cm
Single Plane A2C EF: 71.2 %
Single Plane A4C EF: 70.9 %
Weight: 1824 oz

## 2022-04-29 LAB — LIPID PANEL
Cholesterol: 179 mg/dL (ref 0–200)
HDL: 42 mg/dL (ref >40)
LDL Cholesterol: 111 mg/dL — ABNORMAL HIGH (ref 0–99)
Total CHOL/HDL Ratio: 4.3 RATIO
Triglycerides: 131 mg/dL (ref <150)
VLDL: 26 mg/dL (ref 0–40)

## 2022-04-29 LAB — APTT: aPTT: 35 seconds (ref 24–36)

## 2022-04-29 LAB — PROTIME-INR
INR: 1.3 — ABNORMAL HIGH (ref 0.8–1.2)
Prothrombin Time: 15.9 seconds — ABNORMAL HIGH (ref 11.4–15.2)

## 2022-04-29 LAB — HEMOGLOBIN A1C
Hgb A1c MFr Bld: 5.7 % — ABNORMAL HIGH (ref 4.8–5.6)
Mean Plasma Glucose: 117 mg/dL

## 2022-04-29 LAB — CYTOLOGY - NON PAP

## 2022-04-29 MED ORDER — HEPARIN (PORCINE) 25000 UT/250ML-% IV SOLN: 900.0000 [IU]/h | INTRAVENOUS | Status: DC

## 2022-04-29 MED ORDER — SODIUM CHLORIDE 0.9% FLUSH: 10.0000 mL | INTRAVENOUS | Status: DC | PRN

## 2022-04-29 MED ORDER — POTASSIUM CHLORIDE CRYS ER 20 MEQ PO TBCR
40.0000 meq | EXTENDED_RELEASE_TABLET | Freq: Four times a day (QID) | ORAL | Status: AC
  Administered 2022-04-29 (×2): 40 meq via ORAL
  Filled 2022-04-29 (×2): qty 2

## 2022-04-29 MED ORDER — CHLORHEXIDINE GLUCONATE CLOTH 2 % EX PADS
6.0000 | MEDICATED_PAD | Freq: Every day | CUTANEOUS | Status: DC
  Administered 2022-04-29 – 2022-05-09 (×11): 6 via TOPICAL

## 2022-04-29 MED ORDER — HEPARIN (PORCINE) 25000 UT/250ML-% IV SOLN
850.0000 [IU]/h | INTRAVENOUS | Status: DC
  Administered 2022-04-29: 900 [IU]/h via INTRAVENOUS
  Administered 2022-04-30: 850 [IU]/h via INTRAVENOUS
  Filled 2022-04-29 (×2): qty 250

## 2022-04-29 MED ORDER — VANCOMYCIN HCL 125 MG PO CAPS
125.0000 mg | ORAL_CAPSULE | Freq: Three times a day (TID) | ORAL | Status: DC
  Administered 2022-04-29 – 2022-05-02 (×14): 125 mg via ORAL
  Filled 2022-04-29 (×16): qty 1

## 2022-04-29 MED ORDER — SODIUM CHLORIDE 0.9% FLUSH
10.0000 mL | Freq: Two times a day (BID) | INTRAVENOUS | Status: DC
  Administered 2022-04-30 – 2022-05-06 (×13): 10 mL
  Administered 2022-05-06: 30 mL
  Administered 2022-05-07 – 2022-05-09 (×5): 10 mL

## 2022-04-29 MED ORDER — BACITRACIN ZINC 500 UNIT/GM EX OINT
TOPICAL_OINTMENT | CUTANEOUS | Status: DC | PRN
  Filled 2022-04-29: qty 28.4

## 2022-04-29 MED ORDER — IOHEXOL 350 MG/ML SOLN
75.0000 mL | Freq: Once | INTRAVENOUS | Status: AC | PRN
  Administered 2022-04-29: 75 mL via INTRAVENOUS

## 2022-04-29 NOTE — Consult Note (Signed)
Reason for Consult: Epistaxis Referring Physician: Navika Hoopes is an 37 y.o. female.  HPI: [name] is a 37 year old female with metastatic breast cancer.  She has been bleeding 2-3 times per week for the past 2 months from both sides of her nose.  More recently her bleeding is increased and has been primarily from the right side yesterday.  She is not anticoagulated, her blood pressure is controlled, and her blood counts are reasonably normal with regards to clotting factors.  Her extrinsic system seems to be working reasonably well with a PT/INR of 1.2 in the face of hepatitis.  Past Medical History:  Diagnosis Date   Anal fissure    Anemia 11/2011   iron deficiency. 01/2013 parenteral iron. Intranasal B12.  Dr Myna Hidalgo follows.    B12 deficiency 11/2012   Breast cancer    Breast cancer metastasized to multiple sites, left 02/02/2022   Breast mass in female 04/21/2012   Cervical cancer screening 01/27/2012   Chicken pox as a child   Crohn's disease 2013   ileitis 10/2011. ileitis, mesenteric abscess, ? entero-enteric fistula on 08/2012 CT enterography   Dermatitis 08/22/2016   GERD (gastroesophageal reflux disease)    Hepatitis B immune 07/2012   previous vaccination.    Hives 09/2014   per Dermatologist.  Rx Zyrtec, Benadryl.    Ileitis    Serrated polyp of colon    Small bowel obstruction    SVD (spontaneous vaginal delivery) 08/13/2018   Vitamin D deficiency 2014    Past Surgical History:  Procedure Laterality Date   LAPAROSCOPIC RIGHT HEMI COLECTOMY  07/11/2019   WISDOM TOOTH EXTRACTION  37 yrs old    Family History  Problem Relation Age of Onset   Nephrolithiasis Father    Brain cancer Father    Other Father        anaplastic astrocytoma   Ovarian cancer Paternal Aunt        ovarian   Breast cancer Paternal Aunt    Cancer Paternal Aunt        breast (2019) and ovarian cancer (2005)   Other Paternal Uncle 65       cyst removed from pancreas    Pancreatic cancer Paternal Uncle        Stage 1   Cancer Paternal Uncle        pancreatic   Lung cancer Maternal Grandmother    Gout Maternal Grandfather    Heart attack Maternal Grandfather    Ovarian cancer Paternal Grandmother    Cancer Paternal Grandmother 38       ovarian   Pancreatic cancer Paternal Grandfather    Colon cancer Neg Hx    Stomach cancer Neg Hx    Esophageal cancer Neg Hx     Social History:  reports that she has never smoked. She has never used smokeless tobacco. She reports that she does not currently use alcohol. She reports that she does not use drugs.  Allergies:  Allergies  Allergen Reactions   Other Other (See Comments)    History of Crohn's disease- NO seeds, tomatoes, anything that doesn't digest well, etc.....    Medications: I have reviewed the patient's current medications.  Results for orders placed or performed during the hospital encounter of 05/21/2022 (from the past 48 hour(s))  Comprehensive metabolic panel     Status: Abnormal   Collection Time: 04/30/2022 10:31 AM  Result Value Ref Range   Sodium 130 (L) 135 - 145 mmol/L  Potassium 3.8 3.5 - 5.1 mmol/L   Chloride 106 98 - 111 mmol/L   CO2 17 (L) 22 - 32 mmol/L   Glucose, Bld 118 (H) 70 - 99 mg/dL    Comment: Glucose reference range applies only to samples taken after fasting for at least 8 hours.   BUN 6 6 - 20 mg/dL   Creatinine, Ser 6.210.39 (L) 0.44 - 1.00 mg/dL   Calcium 7.8 (L) 8.9 - 10.3 mg/dL   Total Protein 6.0 (L) 6.5 - 8.1 g/dL   Albumin 3.0 (L) 3.5 - 5.0 g/dL   AST 308252 (H) 15 - 41 U/L   ALT 355 (H) 0 - 44 U/L   Alkaline Phosphatase 984 (H) 38 - 126 U/L   Total Bilirubin 1.6 (H) 0.3 - 1.2 mg/dL   GFR, Estimated >65>60 >78>60 mL/min    Comment: (NOTE) Calculated using the CKD-EPI Creatinine Equation (2021)    Anion gap 7 5 - 15    Comment: Performed at [facility], [address] [place], [place] [address]  CBC with Differential     Status: Abnormal   Collection Time:  05/15/2022 10:31 AM  Result Value Ref Range   WBC 6.8 4.0 - 10.5 K/uL   RBC 2.46 (L) 3.87 - 5.11 MIL/uL   Hemoglobin 7.2 (L) 12.0 - 15.0 g/dL   HCT 95.222.5 (L) 84.136.0 - 32.446.0 %   MCV 91.5 80.0 - 100.0 fL   MCH 29.3 26.0 - 34.0 pg   MCHC 32.0 30.0 - 36.0 g/dL   RDW 40.123.2 (H) 02.711.5 - 25.315.5 %   Platelets 164 150 - 400 K/uL   nRBC 0.0 0.0 - 0.2 %   Neutrophils Relative % 82 %   Neutro Abs 5.6 1.7 - 7.7 K/uL   Lymphocytes Relative 16 %   Lymphs Abs 1.1 0.7 - 4.0 K/uL   Monocytes Relative 1 %   Monocytes Absolute 0.1 0.1 - 1.0 K/uL   Eosinophils Relative 0 %   Eosinophils Absolute 0.0 0.0 - 0.5 K/uL   Basophils Relative 0 %   Basophils Absolute 0.0 0.0 - 0.1 K/uL   WBC Morphology MORPHOLOGY UNREMARKABLE    RBC Morphology MORPHOLOGY UNREMARKABLE    Smear Review PLATELETS APPEAR ADEQUATE    Immature Granulocytes 1 %   Abs Immature Granulocytes 0.05 0.00 - 0.07 K/uL    Comment: Performed at [facility], [address] [place], [place] [address]  Magnesium     Status: None   Collection Time: 05/18/2022 10:31 AM  Result Value Ref Range   Magnesium 2.0 1.7 - 2.4 mg/dL    Comment: Performed at [facility], [address] [place], [place] [address]  Hemoglobin A1c     Status: Abnormal   Collection Time: 05/09/2022 10:31 AM  Result Value Ref Range   Hgb A1c MFr Bld 5.7 (H) 4.8 - 5.6 %    Comment: (NOTE)         Prediabetes: 5.7 - 6.4         Diabetes: >6.4         Glycemic control for adults with diabetes: <7.0    Mean Plasma Glucose 117 mg/dL    Comment: (NOTE) Performed At: [facility] [address] [place], [place] [id number] Jolene SchimkeNagendra Sanjai MD EP:[id number]:[phone]   I-Stat beta hCG blood, ED     Status: None   Collection Time: 04/29/2022 10:39 AM  Result Value Ref Range   I-stat hCG, quantitative <5.0 <5 mIU/mL  Comment 3            Comment:   GEST. AGE      CONC.  (mIU/mL)   <=1 WEEK        5 - 50     2 WEEKS       50 - 500     3 WEEKS       100 - 10,000     4 WEEKS      1,000 - 30,000        FEMALE AND NON-PREGNANT FEMALE:     LESS THAN 5 mIU/mL   CSF cell count with differential     Status: Abnormal   Collection Time: 05/23/2022  2:22 PM  Result Value Ref Range   Tube # 3    Color, CSF COLORLESS COLORLESS   Appearance, CSF CLEAR CLEAR   Supernatant NOT INDICATED    RBC Count, CSF 1 (H) 0 /cu mm   WBC, CSF 50 (HH) 0 - 5 /cu mm    Comment: CRITICAL RESULT CALLED TO, READ BACK BY AND VERIFIED WITH: E ANELLO,RN 1622 05/10/2022 WBOND    Segmented Neutrophils-CSF 81 (H) 0 - 6 %   Lymphs, CSF 10 (L) 40 - 80 %   Monocyte-Macrophage-Spinal Fluid 9 (L) 15 - 45 %   Eosinophils, CSF 0 0 - 1 %   Other Cells, CSF ATYPICAL MONONUCLEAR CELLS NOTED     Comment: PER Patterson Hammersmith, MD NOTIFIED E ANELLO,RN 05/16/2022 Performed at [facility], [address], [place], [place] [address]   CSF culture w Gram Stain     Status: None (Preliminary result)   Collection Time: 04/25/2022  2:22 PM   Specimen: CSF; Cerebrospinal Fluid  Result Value Ref Range   Specimen Description CSF    Special Requests Immunocompromised    Gram Stain      WBC PRESENT,BOTH PMN AND MONONUCLEAR NO ORGANISMS SEEN CYTOSPIN SMEAR    Culture      NO GROWTH < 24 HOURS Performed at [facility], [address], [place], [place] [address]    Report Status PENDING   Protein and glucose, CSF     Status: Abnormal   Collection Time: 05/21/2022  2:22 PM  Result Value Ref Range   Glucose, CSF 20 (LL) 40 - 70 mg/dL    Comment: CRITICAL RESULT CALLED TO, READ BACK BY AND VERIFIED WITH ANELLO,E RN @ 1620 05/14/2022 LEONARD,A   Total  Protein, CSF 48 (H) 15 - 45 mg/dL    Comment: Performed at [facility], [address], [place], [place] [address]  Type and screen [facility]     Status: None   Collection Time: 04/27/2022  6:12 PM  Result Value Ref Range   ABO/RH(D) B POS    Antibody Screen NEG    Sample Expiration      05/01/2022,2359 Performed at [facility], [address] [place], [place] [address]   Culture, blood (single) w Reflex to ID Panel     Status: None (Preliminary result)   Collection Time: 05/16/2022  6:23 PM   Specimen: BLOOD  Result Value Ref Range   Specimen Description BLOOD SITE NOT SPECIFIED    Special Requests      BOTTLES DRAWN AEROBIC AND ANAEROBIC Blood Culture adequate volume   Culture      NO GROWTH < 24 HOURS Performed at [facility], [address] [place],  [place] [address]    Report Status PENDING   Hemoglobin and hematocrit, blood     Status: Abnormal   Collection Time: 05/22/2022  6:26 PM  Result Value Ref Range   Hemoglobin 9.2 (L) 12.0 - 15.0 g/dL    Comment: REPEATED TO VERIFY DELTA CHECK NOTED OK PER M LAMITHAEN,RN 1940 04/30/2022 WBOND    HCT 26.5 (L) 36.0 - 46.0 %    Comment: Performed at [facility], [address], [place], [place] [address]  CBC     Status: Abnormal   Collection Time: 05/21/2022  9:40 PM  Result Value Ref Range   WBC 4.9 4.0 - 10.5 K/uL   RBC 2.83 (L) 3.87 - 5.11 MIL/uL   Hemoglobin 8.3 (L) 12.0 - 15.0 g/dL   HCT 09.8 (L) 11.9 - 14.7 %   MCV 92.2 80.0 - 100.0 fL   MCH 29.3 26.0 - 34.0 pg   MCHC 31.8 30.0 - 36.0 g/dL   RDW 82.9 (H) 56.2 - 13.0 %   Platelets 141 (L) 150 - 400 K/uL   nRBC 0.0 0.0 - 0.2 %    Comment: Performed at [facility], [address], [place], [place] [address]  Lipid panel     Status: Abnormal   Collection Time: 04/29/22  6:24 AM  Result Value Ref Range   Cholesterol 179 0 - 200 mg/dL   Triglycerides 469 <629 mg/dL   HDL 42 >52 mg/dL   Total CHOL/HDL Ratio 4.3 RATIO   VLDL 26 0 - 40 mg/dL   LDL Cholesterol 841 (H) 0 - 99 mg/dL    Comment:        Total Cholesterol/HDL:CHD Risk Coronary Heart Disease Risk Table                     Men   Women  1/2 Average Risk   3.4   3.3  Average Risk       5.0   4.4  2 X Average Risk   9.6   7.1  3 X Average Risk  23.4   11.0        Use the calculated Patient Ratio above and the CHD Risk  Table to determine the patient's CHD Risk.        ATP III CLASSIFICATION (LDL):  <100     mg/dL   Optimal  324-401  mg/dL   Near or Above                    Optimal  130-159  mg/dL   Borderline  027-253  mg/dL   High  >664     mg/dL   Very High Performed at [facility], [address] [place], [place] [address]   Comprehensive metabolic panel     Status: Abnormal   Collection Time: 04/29/22  6:24 AM  Result Value Ref Range   Sodium 133 (L) 135 - 145 mmol/L   Potassium 3.3 (L) 3.5 - 5.1 mmol/L   Chloride 109 98 - 111 mmol/L   CO2 16 (L) 22 - 32 mmol/L   Glucose, Bld 97 70 - 99 mg/dL    Comment: Glucose reference range applies only to samples taken after fasting for at least 8 hours.   BUN 6 6 - 20 mg/dL   Creatinine, Ser 4.25 (L) 0.44 - 1.00 mg/dL   Calcium 6.8 (L) 8.9 - 10.3 mg/dL   Total Protein 5.2 (L) 6.5 - 8.1 g/dL  Albumin 2.4 (L) 3.5 - 5.0 g/dL   AST 161 (H) 15 - 41 U/L   ALT 402 (H) 0 - 44 U/L   Alkaline Phosphatase 887 (H) 38 - 126 U/L   Total Bilirubin 1.5 (H) 0.3 - 1.2 mg/dL   GFR, Estimated >09 >60 mL/min    Comment: (NOTE) Calculated using the CKD-EPI Creatinine Equation (2021)    Anion gap 8 5 - 15    Comment: Performed at [facility], [address] [place], [place] [address]  CBC with Differential/Platelet     Status: Abnormal   Collection Time: 04/29/22  6:24 AM  Result Value Ref Range   WBC 3.8 (L) 4.0 - 10.5 K/uL   RBC 2.70 (L) 3.87 - 5.11 MIL/uL   Hemoglobin 8.0 (L) 12.0 - 15.0 g/dL   HCT 81.1 (L) 91.4 - 78.2 %   MCV 92.2 80.0 - 100.0 fL   MCH 29.6 26.0 - 34.0 pg   MCHC 32.1 30.0 - 36.0 g/dL   RDW 95.6 (H) 21.3 - 08.6 %   Platelets 138 (L) 150 - 400 K/uL   nRBC 0.0 0.0 - 0.2 %   Neutrophils Relative % 85 %   Neutro Abs 3.2 1.7 - 7.7 K/uL   Lymphocytes Relative 11 %   Lymphs Abs 0.4 (L) 0.7 - 4.0 K/uL   Monocytes Relative 0 %   Monocytes Absolute 0.0 (L) 0.1 - 1.0 K/uL   Eosinophils Relative 0 %   Eosinophils Absolute 0.0 0.0  - 0.5 K/uL   Basophils Relative 3 %   Basophils Absolute 0.1 0.0 - 0.1 K/uL   nRBC 0 0 /100 WBC   Myelocytes 1 %   Abs Immature Granulocytes 0.00 0.00 - 0.07 K/uL   Acanthocytes PRESENT     Comment: Performed at [facility], [address] [place], [place] [address]    ECHOCARDIOGRAM COMPLETE  Result Date: 04/29/2022    ECHOCARDIOGRAM REPORT   Patient Name:   Carla Clark Date of Exam: 04/29/2022 Medical Rec #:  [id number]      Height:       63.0 in Accession #:    [id number]     Weight:       114.0 lb Date of Birth:  02-Aug-1985       BSA:          1.523 m Patient Age:    36 years       BP:           122/75 mmHg Patient Gender: F              HR:           85 bpm. Exam Location:  Inpatient Procedure: 2D Echo, Cardiac Doppler, Color Doppler, Strain Analysis and 3D Echo Indications:    stroke  History:        Patient has no prior history of Echocardiogram examinations.  Sonographer:    Mike Gip Referring Phys: [phone] DENISE A WOLFE IMPRESSIONS  1. Left ventricular ejection fraction, by estimation, is 65 to 70%. Left ventricular ejection fraction by 3D volume is 65 %. The left ventricle has normal function. The left ventricle has no regional wall motion abnormalities. Left ventricular diastolic  parameters were normal. The average left ventricular global longitudinal strain is -20.6 %. The global longitudinal strain is normal.  2. Right ventricular systolic function is normal. The right ventricular size is normal. There is normal pulmonary artery systolic pressure. The estimated  right ventricular systolic pressure is 33.6 mmHg.  3. The mitral valve is normal in structure. Mild mitral valve regurgitation. No evidence of mitral stenosis.  4. The aortic valve is normal in structure. Aortic valve regurgitation is not visualized. No aortic stenosis is present.  5. The inferior vena cava is dilated in size with >50% respiratory variability, suggesting right atrial pressure of 8 mmHg. FINDINGS  Left  Ventricle: Left ventricular ejection fraction, by estimation, is 65 to 70%. Left ventricular ejection fraction by 3D volume is 65 %. The left ventricle has normal function. The left ventricle has no regional wall motion abnormalities. The average left ventricular global longitudinal strain is -20.6 %. The global longitudinal strain is normal. The left ventricular internal cavity size was normal in size. There is no left ventricular hypertrophy. Left ventricular diastolic parameters were normal. Normal left ventricular filling pressure. Right Ventricle: The right ventricular size is normal. No increase in right ventricular wall thickness. Right ventricular systolic function is normal. There is normal pulmonary artery systolic pressure. The tricuspid regurgitant velocity is 2.53 m/s, and  with an assumed right atrial pressure of 8 mmHg, the estimated right ventricular systolic pressure is 33.6 mmHg. Left Atrium: Left atrial size was normal in size. Right Atrium: Right atrial size was normal in size. Pericardium: There is no evidence of pericardial effusion. Mitral Valve: The mitral valve is normal in structure. Mild mitral valve regurgitation. No evidence of mitral valve stenosis. Tricuspid Valve: The tricuspid valve is normal in structure. Tricuspid valve regurgitation is trivial. No evidence of tricuspid stenosis. Aortic Valve: The aortic valve is normal in structure. Aortic valve regurgitation is not visualized. No aortic stenosis is present. Pulmonic Valve: The pulmonic valve was normal in structure. Pulmonic valve regurgitation is trivial. No evidence of pulmonic stenosis. Aorta: The aortic root is normal in size and structure. Venous: The inferior vena cava is dilated in size with greater than 50% respiratory variability, suggesting right atrial pressure of 8 mmHg. IAS/Shunts: No atrial level shunt detected by color flow Doppler.  LEFT VENTRICLE PLAX 2D LVIDd:         4.80 cm         Diastology LVIDs:          2.70 cm         LV e' medial:    12.50 cm/s LV PW:         0.80 cm         LV E/e' medial:  9.8 LV IVS:        0.80 cm         LV e' lateral:   16.00 cm/s                                LV E/e' lateral: 7.6  LV Volumes (MOD)               2D LV vol d, MOD    130.0 ml      Longitudinal A2C:                           Strain LV vol d, MOD    117.0 ml      2D Strain GLS  -22.6 % A4C:                           (A2C): LV vol  s, MOD    37.4 ml       2D Strain GLS  -18.7 % A2C:                           (A3C): LV vol s, MOD    34.1 ml       2D Strain GLS  -25.0 % A4C:                           (A4C): LV SV MOD A2C:   92.6 ml       2D Strain GLS  -20.6 % LV SV MOD A4C:   117.0 ml      Avg: LV SV MOD BP:    87.3 ml                                3D Volume EF                                LV 3D EF:    Left                                             ventricul                                             ar                                             ejection                                             fraction                                             by 3D                                             volume is                                             65 %.                                 3D Volume EF:                                3D EF:  65 %                                LV EDV:       140 ml                                LV ESV:       50 ml                                LV SV:        91 ml RIGHT VENTRICLE             IVC RV Basal diam:  3.40 cm     IVC diam: 2.40 cm RV S prime:     18.60 cm/s TAPSE (M-mode): 2.4 cm LEFT ATRIUM             Index        RIGHT ATRIUM          Index LA diam:        3.30 cm 2.17 cm/m   RA Area:     9.87 cm LA Vol (A2C):   48.9 ml 32.11 ml/m  RA Volume:   16.90 ml 11.10 ml/m LA Vol (A4C):   38.1 ml 25.02 ml/m LA Biplane Vol: 44.0 ml 28.90 ml/m  AORTIC VALVE LVOT Vmax:   116.00 cm/s LVOT Vmean:  83.400 cm/s LVOT VTI:    0.266 m  AORTA Ao Asc diam: 2.50 cm MITRAL VALVE                   TRICUSPID VALVE MV Area (PHT): 3.13 cm       TR Peak grad:   25.6 mmHg MV Decel Time: 242 msec       TR Vmax:        253.00 cm/s MR Peak grad:    120.6 mmHg MR Mean grad:    81.0 mmHg    SHUNTS MR Vmax:         549.00 cm/s  Systemic VTI: 0.27 m MR Vmean:        428.0 cm/s MR PISA:         0.57 cm MR PISA Eff ROA: 4 mm MR PISA Radius:  0.30 cm MV E velocity: 122.00 cm/s MV A velocity: 95.50 cm/s MV E/A ratio:  1.28 Armanda Magic MD Electronically signed by Armanda Magic MD Signature Date/Time: 04/29/2022/11:12:54 AM    Final    DG FL GUIDED LUMBAR PUNCTURE  Result Date: May 28, 2022 CLINICAL DATA:  Metastatic breast cancer. Concern for leptomeningeal disease. EXAM: LUMBAR PUNCTURE UNDER FLUOROSCOPY PROCEDURE: An appropriate skin entry site was determined fluoroscopically. Operator donned sterile gloves and mask. Skin site was marked, then prepped with Betadine, draped in usual sterile fashion, and infiltrated locally with 1% lidocaine. A 20 gauge spinal needle advanced into the thecal sac at L4-L5 from a right interlaminar approach. Clear colorless CSF spontaneously returned, with opening pressure of 33 cm water. 15 ml CSF were collected and divided among 4 sterile vials for the requested laboratory studies. The needle was then removed. The patient tolerated the procedure well and there were no complications. FLUOROSCOPY: Radiation Exposure Index (as provided by the fluoroscopic device): 0.7 mGy Kerma IMPRESSION: Technically successful lumbar puncture under fluoroscopy. This exam was performed by  Loyce Dys, and was supervised and interpreted by Malachi Pro MD. Electronically Signed   By: Obie Dredge M.D.   On: 05/18/2022 15:10   MR Brain W and Wo Contrast  Result Date: 05/16/2022 CLINICAL DATA:  Provided history: Headache, increasing frequency or severity. Additional history obtained from electronic MEDICAL RECORD NUMBERStage IV breast cancer. EXAM: MRI HEAD WITHOUT AND WITH CONTRAST TECHNIQUE:  Multiplanar, multiecho pulse sequences of the brain and surrounding structures were obtained without and with intravenous contrast. CONTRAST:  32mL GADAVIST GADOBUTROL 1 MMOL/ML IV SOLN COMPARISON:  Head CT 04/26/2022.  Brain MRI 03/07/2022. FINDINGS: Brain: No age advanced or lobar predominant parenchymal atrophy. 1.4 cm focus of enhancement within or along the right postcentral sulcus with adjacent cortical edema (for instance as seen on series 10, images 41-45) (series 12, image 12) (series 6, image 29). 1.3 cm focus of enhancement within or along the inferomedial left parietal lobe with adjacent cortical edema (for instance as seen on series 10, image 34) (series 12, image 10). Foci of diffusion-weighted and T2 FLAIR hyperintense signal abnormality within the bilateral frontoparietal white matter measuring up to 2.3 cm. There is T2 shine through at the majority of these sites. However, there is a 7 mm focus within the anterior right frontal lobe white matter which demonstrates true restricted diffusion (series 2, image 33) (series 250, image 33), and a 6 mm focus within the posterior left frontal lobe white matter which demonstrates true restricted diffusion. Additionally, there is a 3 mm focus of restricted diffusion and T2 FLAIR hyperintense signal abnormality within the left occipital cortex (series 2, image 30). There is no corresponding enhancement at these sites. These are favored to reflect infarcts, with the foci in the anterior right frontal lobe white matter, posterior left frontal lobe white matter and left occipital cortex being acute, and the remainder subacute to chronic. Mildly prominent enhancement along the bilateral seventh and eighth cranial nerves within the internal auditory canals (series 10, images 16 and 17). Chronic microhemorrhage within the right parietal lobe (series 7, image 86). A previously demonstrated right subdural hematoma is no longer present. No extra-axial fluid collection.  No midline shift. Vascular: Maintained flow voids within the proximal large arterial vessels. Skull and upper cervical spine: No suspicious marrow lesion. Sinuses/Orbits: No mass or acute finding within the imaged orbits. No significant paranasal sinus disease. IMPRESSION: 1. 1.4 cm focus of enhancement within or along the right postcentral sulcus with adjacent cortical edema. 1.3 cm focus of enhancement within or along the medial left parietal lobe with adjacent cortical edema. While these may reflect subacute infarcts, leptomeningeal metastatic disease cannot be excluded. Correlation with CSF analysis and MR imaging of the spine should be considered. Additionally, a short-interval follow-up brain MRI (without and with contrast) is recommended in 6-8 weeks. 2. Multiple foci of signal abnormality within the bilateral frontoparietal lobes measuring up to 2.3 cm, likely reflecting infarcts of various age. Foci within the anterior right frontal lobe white matter, posterior left frontal lobe white matter and left occipital cortex are most compatible with acute infarcts. The remainder are likely subacute to chronic. Findings are suspicious for an embolic process. 3. Mildly prominent enhancement along the bilateral seventh and eighth cranial nerves within the internal auditory canals. Findings are indeterminate for leptomeningeal metastatic disease versus prominent vascular enhancement. Consider utilizing an internal auditory canal protocol at the time of brain MRI follow-up. 4. Chronic microhemorrhage within the right parietal lobe. 5. Previously demonstrated right subdural hematoma no longer present.  Electronically Signed   By: Jackey Loge D.O.   On: 05-22-2022 12:43    Review of Systems Blood pressure 126/86, pulse 85, temperature 97.7 F (36.5 C), temperature source Oral, resp. rate 15, height 5\' 3"  (1.6 m), weight 51.7 kg, last menstrual period 02/19/2022, SpO2 99 %, currently breastfeeding. Physical  Exam  Patient is comfortable lying supine in bed.  Her husband is in the room during the visit. Communicates well without shortness of breath Pupils equal round and reactive to light/gaze conjugate Neck without mass or adenopathy Pinna normal without mastoid tenderness or bruising Face atraumatic without bony step-offs Symmetric expansions bilaterally without use of accessory muscles Radial nerves intact without focal motor or sensory deficits Normal lips and tongue mobility Patient had large crusts filling both sides of the nose anteriorly.   Assessment/Plan:  Bilateral epistaxis, anterior complex  I used a bit of bacitracin ointment to soften the scabs and then gently remove them from both sides of the septum.  The septum was then topically anesthetized using a lidocaine solution.  I addressed the right side first as this reportedly was the side bleeding most recently.  There were large anterior vessels at Kiesselbach's plexus on the right which I cauterized using silver nitrate, covering the cauterized area with bacitracin soaked Surgicel.  She tolerated this well without complications and no bleeding.  The right side was then addressed.  After adequate time was given for lidocaine anesthesia she was noted to have a much larger more extensive area of active bleeding on the left.  I cauterized this area using silver nitrate and further controlled using topical Afrin.  I elected to place a reduced Merocel sponge on the left due to the active bleeding.  This was done atraumatically without discomfort.  Bleeding was effectively controlled.  Recommendations:  Pack should remain in place on the left until Monday morning, 3 days from today.  2.   I would use bacitracin ointment twice daily and a liberal fashion on the right side to help keep it from drying out while she is here in the hospital.  3.   Nasal cannula O2 should be avoided at all costs.  Use a nonrebreather mask or humidified air  via face tent if supplemental O2 is needed.  4.   Ideally, this patient should be maintained off of pharmacologic anticoagulation.  She is at high risk of bleeding if initiated on platelet inhibitors or other anticoagulants.  With that being said, I will leave the ultimate decision regarding anticoagulation to her oncologist as he best understands the risks of clots and potential sequela and can weigh that against the risk of ongoing epistaxis.  I will follow with you.  Rejeana Brock Jr. 04/29/2022, 12:03 PM

## 2022-04-29 NOTE — Progress Notes (Signed)
SLP Cancellation Note  Patient Details Name: Carla Clark MRN: [id number] DOB: 20-Aug-1985   Cancelled treatment:       Reason Eval/Treat Not Completed: Patient at procedure or test/unavailable   Emmerson Taddei, Riley Nearing 04/29/2022, 12:20 PM

## 2022-04-29 NOTE — Progress Notes (Signed)
OT Cancellation Note  Patient Details Name: Carla Clark MRN: [id number] DOB: 1985-05-03   Cancelled Treatment:    Reason Eval/Treat Not Completed: Patient at procedure or test/ unavailable;Medical issues which prohibited therapy. Pt vomited prior to OOB and then leaving for test. Will follow.  Evern Bio 04/29/2022, 3:14 PM Berna Spare, OTR/L Acute Rehabilitation Services Office: [phone]

## 2022-04-29 NOTE — Care Management (Signed)
  Transition of Care [facility]) Screening Note   Patient Details  Name: MELVINA BEDINGER Date of Birth: 10-21-1985   Transition of Care [facility]) CM/SW Contact:    Gordy Clement, RN Phone Number: 04/29/2022, 11:40 AM    Transition of Care Department [facility]) has reviewed patient . PT/OT/SLP to eval- Recs pending    We will continue to monitor patient advancement through interdisciplinary progression rounds. If new patient transition needs arise, please place a TOC consult.

## 2022-04-29 NOTE — Progress Notes (Deleted)
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    Patient Measurements: Height: 5\' 3"  (160 cm) Weight: 51.7 kg (114 lb) IBW/kg (Calculated) : 52.4 Heparin Dosing Weight: 51.7 kg  Vital Signs: Temp: 97.6 F (36.4 C) (04/05 0400) Temp Source: Oral (04/05 0400) BP: 122/75 (04/05 0400) Pulse Rate: 85 (04/05 0400)  Labs: Recent Labs    04/26/22 2042 04/28/22 1031 04/28/22 1826 04/28/22 2140 04/29/22 0624  HGB 8.4* 7.2* 9.2* 8.3* 8.0*  HCT 26.7* 22.5* 26.5* 26.1* 24.9*  PLT 186 164  --  141* 138*  LABPROT 14.9  --   --   --   --   INR 1.2  --   --   --   --   CREATININE 0.39* 0.39*  --   --   --     Estimated Creatinine Clearance: 80.9 mL/min (A) (by C-G formula based on SCr of 0.39 mg/dL (L)).   Medical History: Past Medical History:  Diagnosis Date   Anal fissure    Anemia 11/2011   iron deficiency. 01/2013 parenteral iron. Intranasal B12.  Dr Myna Hidalgo follows.    B12 deficiency 11/2012   Breast cancer    Breast cancer metastasized to multiple sites, left 02/02/2022   Breast mass in female 04/21/2012   Cervical cancer screening 01/27/2012   Chicken pox as a child   Crohn's disease 2013   ileitis 10/2011. ileitis, mesenteric abscess, ? entero-enteric fistula on 08/2012 CT enterography   Dermatitis 08/22/2016   GERD (gastroesophageal reflux disease)    Hepatitis B immune 07/2012   previous vaccination.    Hives 09/2014   per Dermatologist.  Rx Zyrtec, Benadryl.    Ileitis    Serrated polyp of colon    Small bowel obstruction    SVD (spontaneous vaginal delivery) 08/13/2018   Vitamin D deficiency 2014    Medications:  Medications Prior to Admission  Medication Sig Dispense Refill Last Dose   cefdinir  (OMNICEF) 300 MG capsule Take 2 capsules (600 mg total) by mouth daily. 14 capsule 0 unknown   docusate sodium (COLACE) 100 MG capsule Take 1 capsule (100 mg total) by mouth 2 (two) times daily. (Patient taking differently: Take 100 mg by mouth daily as needed for mild constipation.) 10 capsule 0 Past Week   hydrocortisone (ANUSOL-HC) 2.5 % rectal cream Place 1 Application rectally 2 (two) times daily. 28 g 1 Past Week   lidocaine-prilocaine (EMLA) cream Apply 1 Application topically See admin instructions. Apply a dime-size of cream to port-a-cath 1-2 hours prior to access. Cover with [employer].   04/28/2022   LORazepam (ATIVAN) 0.5 MG tablet Take 1 tablet (0.5 mg total) by mouth every 6 (six) hours as needed for anxiety. 30 tablet 0 04/24/2022   melatonin 3 MG TABS tablet Take 3 mg by mouth at bedtime as needed (For sleep).   Past Week   ondansetron (ZOFRAN) 8 MG tablet Take 1 tablet (8 mg total) by mouth every 8 (eight) hours as needed for nausea or vomiting. 30 tablet 1 Past Month   pantoprazole (PROTONIX) 40 MG tablet Take 1 tablet (40 mg total) by mouth 2 (two) times daily. (Patient taking differently: Take 40 mg by mouth daily as needed (For acid reflux).) 60 tablet 1 Past  Week   polyethylene glycol (MIRALAX / GLYCOLAX) 17 g packet Take 17 g by mouth daily as needed for mild constipation. 14 each 0 Past Week   prochlorperazine (COMPAZINE) 10 MG tablet Take 1 tablet (10 mg total) by mouth every 6 (six) hours as needed for nausea or vomiting. 30 tablet 1 04/28/2022   pyridoxine (NEURO-K-250 VITAMIN B6) 250 MG tablet Take 2 tablets (500 mg total) by mouth daily. 60 tablet 6 Past Week   traMADol (ULTRAM) 50 MG tablet Take 50 mg by mouth every 6 (six) hours as needed (for pain).   unknown   vitamin B-12 (CYANOCOBALAMIN) 100 MCG tablet Take 100 mcg by mouth daily.   Past Week   fluconazole (DIFLUCAN) 100 MG tablet Take 1 tablet (100 mg total) by mouth daily. (Patient not taking: Reported on 04/28/2022) 30  tablet 1 Not Taking   methylPREDNISolone (MEDROL DOSEPAK) 4 MG TBPK tablet Take as directed (Patient not taking: Reported on 04/28/2022) 1 each 0 Not Taking   predniSONE (DELTASONE) 10 MG tablet Take 8 tablets (80 mg total) by mouth daily with breakfast. (Patient not taking: Reported on 04/28/2022) 120 tablet 1 Completed Course   vancomycin (VANCOCIN) 125 MG capsule Take 1 capsule (125 mg total) by mouth 4 (four) times daily. (Patient not taking: Reported on 04/28/2022) 40 capsule 0 Completed Course   Scheduled:    stroke: early stages of recovery book   Does not apply Once   aspirin  81 mg Oral Daily   pantoprazole  40 mg Oral BID   saccharomyces boulardii  250 mg Oral BID   vancomycin  125 mg Oral TID AC & HS    Assessment: 36 YOF admitted to 5W w/ a cc of emesis and dizziness. She has a PMH positive for hypercoagulable state 2/2 breast CA. MRI of the brain shows multiple infarcts of various ages thought to be embolic in nature. There is no history noted for DOAC usage. A consult for a heparin infusion is placed.   Goal of Therapy:  Heparin level 0.3-0.5 units/ml Monitor platelets by anticoagulation protocol: Yes   Plan:  No heparin bolus Start heparin infusion at 900 units/hr Check anti-Xa level in 8 hours and daily while on heparin Continue to monitor H&H and platelets Monitor for signs of bleeding  Greta Doom BS, PharmD, BCPS Clinical Pharmacist 04/29/2022 7:58 AM  Contact: [phone] after 3 PM  "Be curious, not judgmental..." -Debbora Dus

## 2022-04-29 NOTE — Progress Notes (Signed)
Came back by several hours after cauterizing both sides of her nose and packing the left side.  She said no new bleeding since the procedure.  I did note that medicine elected to proceed with anticoagulation.  I will obviously defer to them regarding the risks of not anticoagulating her.  To be clear, the likelihood of her having recurrent epistaxis is very likely if she is systemically anticoagulated particularly after today's procedure.  I will follow with you and do my best to manage her epistaxis.

## 2022-04-29 NOTE — Progress Notes (Signed)
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    Patient Measurements: Height: 5\' 3"  (160 cm) Weight: 51.7 kg (114 lb) IBW/kg (Calculated) : 52.4 Heparin Dosing Weight: 51.7 kg  Vital Signs: Temp: 97.4 F (36.3 C) (04/05 1251) Temp Source: Axillary (04/05 1251) BP: 142/91 (04/05 1251) Pulse Rate: 75 (04/05 1251)  Labs: Recent Labs    04/26/22 2042 04/28/22 1031 04/28/22 1826 04/28/22 2140 04/29/22 0624 04/29/22 1206  HGB 8.4* 7.2* 9.2* 8.3* 8.0*  --   HCT 26.7* 22.5* 26.5* 26.1* 24.9*  --   PLT 186 164  --  141* 138*  --   APTT  --   --   --   --   --  35  LABPROT 14.9  --   --   --   --  15.9*  INR 1.2  --   --   --   --  1.3*  CREATININE 0.39* 0.39*  --   --  0.36*  --      Estimated Creatinine Clearance: 80.9 mL/min (A) (by C-G formula based on SCr of 0.36 mg/dL (L)).   Medical History: Past Medical History:  Diagnosis Date   Anal fissure    Anemia 11/2011   iron deficiency. 01/2013 parenteral iron. Intranasal B12.  Dr Myna Hidalgo follows.    B12 deficiency 11/2012   Breast cancer    Breast cancer metastasized to multiple sites, left 02/02/2022   Breast mass in female 04/21/2012   Cervical cancer screening 01/27/2012   Chicken pox as a child   Crohn's disease 2013   ileitis 10/2011. ileitis, mesenteric abscess, ? entero-enteric fistula on 08/2012 CT enterography   Dermatitis 08/22/2016   GERD (gastroesophageal reflux disease)    Hepatitis B immune 07/2012   previous vaccination.    Hives 09/2014   per Dermatologist.  Rx Zyrtec, Benadryl.    Ileitis    Serrated polyp of colon    Small bowel obstruction    SVD (spontaneous vaginal delivery) 08/13/2018   Vitamin D deficiency 2014    Medications:   Medications Prior to Admission  Medication Sig Dispense Refill Last Dose   cefdinir (OMNICEF) 300 MG capsule Take 2 capsules (600 mg total) by mouth daily. 14 capsule 0 unknown   docusate sodium (COLACE) 100 MG capsule Take 1 capsule (100 mg total) by mouth 2 (two) times daily. (Patient taking differently: Take 100 mg by mouth daily as needed for mild constipation.) 10 capsule 0 Past Week   hydrocortisone (ANUSOL-HC) 2.5 % rectal cream Place 1 Application rectally 2 (two) times daily. 28 g 1 Past Week   lidocaine-prilocaine (EMLA) cream Apply 1 Application topically See admin instructions. Apply a dime-size of cream to port-a-cath 1-2 hours prior to access. Cover with [employer].   04/28/2022   LORazepam (ATIVAN) 0.5 MG tablet Take 1 tablet (0.5 mg total) by mouth every 6 (six) hours as needed for anxiety. 30 tablet 0 04/24/2022   melatonin 3 MG TABS tablet Take 3 mg by mouth at bedtime as needed (For sleep).   Past Week   ondansetron (ZOFRAN) 8 MG tablet Take 1 tablet (8 mg total) by mouth every 8 (eight) hours as needed for nausea or vomiting. 30 tablet 1 Past Month   pantoprazole (PROTONIX)  40 MG tablet Take 1 tablet (40 mg total) by mouth 2 (two) times daily. (Patient taking differently: Take 40 mg by mouth daily as needed (For acid reflux).) 60 tablet 1 Past Week   polyethylene glycol (MIRALAX / GLYCOLAX) 17 g packet Take 17 g by mouth daily as needed for mild constipation. 14 each 0 Past Week   prochlorperazine (COMPAZINE) 10 MG tablet Take 1 tablet (10 mg total) by mouth every 6 (six) hours as needed for nausea or vomiting. 30 tablet 1 04/28/2022   pyridoxine (NEURO-K-250 VITAMIN B6) 250 MG tablet Take 2 tablets (500 mg total) by mouth daily. 60 tablet 6 Past Week   traMADol (ULTRAM) 50 MG tablet Take 50 mg by mouth every 6 (six) hours as needed (for pain).   unknown   vitamin B-12 (CYANOCOBALAMIN) 100 MCG tablet Take 100 mcg by mouth daily.   Past Week   fluconazole (DIFLUCAN) 100 MG tablet Take  1 tablet (100 mg total) by mouth daily. (Patient not taking: Reported on 04/28/2022) 30 tablet 1 Not Taking   methylPREDNISolone (MEDROL DOSEPAK) 4 MG TBPK tablet Take as directed (Patient not taking: Reported on 04/28/2022) 1 each 0 Not Taking   predniSONE (DELTASONE) 10 MG tablet Take 8 tablets (80 mg total) by mouth daily with breakfast. (Patient not taking: Reported on 04/28/2022) 120 tablet 1 Completed Course   vancomycin (VANCOCIN) 125 MG capsule Take 1 capsule (125 mg total) by mouth 4 (four) times daily. (Patient not taking: Reported on 04/28/2022) 40 capsule 0 Completed Course   Scheduled:   Chlorhexidine Gluconate Cloth  6 each Topical Daily   pantoprazole  40 mg Oral BID   potassium chloride  40 mEq Oral Q6H   saccharomyces boulardii  250 mg Oral BID   vancomycin  125 mg Oral TID AC & HS    Assessment: 36 YOF admitted to 5W w/ a cc of emesis and dizziness. She has a PMH positive for hypercoagulable state 2/2 breast CA. MRI of the brain shows multiple infarcts of various ages thought to be embolic in nature. There is no history noted for DOAC usage. She has had frequent nose bleeds and was seen by ENT. She has been cleared for heparin infusion by this service. A consult for a heparin infusion is placed.   Goal of Therapy:  Heparin level 0.3-0.5 units/ml Monitor platelets by anticoagulation protocol: Yes   Plan:  No heparin bolus Start heparin infusion at 900 units/hr Check anti-Xa level in 8 hours and daily while on heparin Continue to monitor H&H and platelets Monitor for signs of bleeding  Kamren Heskett BS, PharmD, BCPS Clinical Pharmacist 04/29/2022 3:00 PM  Contact: [phone] after 3 PM  "Be curious, not judgmental..." -Debbora Dus

## 2022-04-29 NOTE — Progress Notes (Signed)
PT Cancellation Note  Patient Details Name: Carla Clark MRN: [id number] DOB: [birth year]   Cancelled Treatment:    Reason Eval/Treat Not Completed: Patient at procedure or test/unavailable. Attempted to see pt this afternoon, however pt with emesis episode upon arrival and about to transport for CT, requesting PT follow up at another time. Acute PT will follow as appropriate/available. Thank you.   KHAMARIA HENES 04/29/2022, 3:12 PM

## 2022-04-29 NOTE — Progress Notes (Signed)
PROGRESS NOTE    Carla Clark  ZOX:[id number] DOB: [birth year] DOA: 05/19/2022 PCP: Bradd Canary, MD    Chief Complaint  Patient presents with   Emesis   Dizziness    Brief Narrative:   Carla Clark is a 37 y.o. female with medical history significant of metastatic breast cancer currently undergoing chemotherapy, Crohn's disease with ileitis s/p left hemicolectomy, SBO, and gerd who presented for headache and emesis.  She had an ED visit at [facility] long couple days ago, where they performed a CT scan of the head which had noted a small focus of cortical hypodensity with effacement of the gray-white matter differentiation involving the right perirolandic frontoparietal cortex at the vertex that was thought to be nonspecific but could be a small cortical infarct.  MRI was not available then, as well patient with recent diagnosis of C. difficile diarrhea, she just finished her oral vancomycin on Tuesday, he is currently on chemotherapy, as her Rande Lawman was recently stopped due to elevated LFTs.  Her workup in ED  MRI of the brain significant for 1.4 focus of enhancement within or along the right postcentral sulcus with adjacent cortical edema, 1.3 focus of enhancement along the medial left parietal lobe with adjacent cortical edema, multiple infarcts noted within the bilateral frontal parietal lobes of various ages thought to be embolic in nature, and prominent enhancement bilateral 7th and 8th cranial nerves within the auditory canals concern for leptomeningeal metastatic disease.  Patient was not a candidate for tPA due to being outside the window.  Patient went for an LP, significant for malignancy.  .   Assessment & Plan:   Principal Problem:   Stroke Active Problems:   Anemia   Frequent nosebleeds   Breast cancer metastasized to multiple sites, left   Hyponatremia   Elevated liver enzymes   History of infection of intestine due to Clostridioides difficile   Gastroesophageal reflux  disease without esophagitis   Leptomeningeal disease  Multiple CVAs Leptomeningeal spread -MRI brain significant for multiple acute/subacute CVAs . -No with CVA workup, CTA head and neck pending, 2D echo is pending as well, continue to monitor on telemetry . -Continue with aspirin 81 mg oral daily . -I have discussed with neurology, patient likely with hypercoagulable state in the setting of malignancy, will obtain venous Dopplers . -Gust with neurology regarding full anticoagulation, given small size of strokes, and its subacute nature, no contraindication from neurological stand. -Discussed cytology finding with pathologist Dr. Luisa Hart, finding concerning for malignancy . -Patient will leptomeningeal spread of her malignancy -On full anticoagulation till she is evaluated by ENT given recurrent epistaxis. -PT/OT/speech to evaluate and treat -IV antibiotics has been discontinued given low concern for meningitis now her CSF findings more concerning for meningeal spread.  Epistaxis -The has been frequent recurrent problem over the last few month, she just had an episode of epistaxis this morning. -ENT consulted  Normocytic anemia -Due to malignancy and chemotherapy, transfuse as needed.   Metastatic breast cancer with concern of leptomeningeal spread. -Management per GI Dr. Myna Hidalgo   Hyponatremia Roving with IV NS   Elevated liver enzymes -Thought to be secondary to [facility], trending up, GI consulted.  Recent history of C. Difficile Patient reported completing her course of oral vancomycin recently. -Monitor intake and output -Add-on probiotics   GERD -Continue Protonix    DVT prophylaxis: will start heparin if no further epistaxis, and SCD if venous doppler is negative  Code Status: (Full/Partial - specify details) Family Communication: (  Specify name, relationship & date discussed. NO "discussed with patient") Disposition:   Status is: Inpatient    Consultants:   Neurology Oncology ENT   Subjective:  She does report of epistaxis overnight.  Objective: Vitals:   05/01/2022 2006 04/29/22 0000 04/29/22 0400 04/29/22 0951  BP: 117/77 105/71 122/75 126/86  Pulse: 99 72 85 85  Resp: 16 13 15 15   Temp: 97.9 F (36.6 C) 98.1 F (36.7 C) 97.6 F (36.4 C) 97.7 F (36.5 C)  TempSrc: Oral Oral Oral Oral  SpO2:  98% 99% 99%  Weight:      Height:        Intake/Output Summary (Last 24 hours) at 04/29/2022 1020 Last data filed at 04/29/2022 4166 Gross per 24 hour  Intake 2392.78 ml  Output 600 ml  Net 1792.78 ml   Filed Weights   05/01/2022 1027  Weight: 51.7 kg    Examination:  Awake Alert, Oriented X 3, frail,  ill-appearing, with alopecia, dried blood around her nose and in the back of her throat. Symmetrical Chest wall movement, Good air movement bilaterally, CTAB RRR,No Gallops,Rubs or new Murmurs, No Parasternal Heave +ve B.Sounds, Abd Soft, No tenderness, No rebound - guarding or rigidity. No Cyanosis, Clubbing or edema, No new Rash or bruise       Data Reviewed: I have personally reviewed following labs and imaging studies  CBC: Recent Labs  Lab 04/25/22 1030 04/26/22 2042 05/07/2022 1031 05/06/2022 1826 05/05/2022 2140 04/29/22 0624  WBC 6.2 7.3 6.8  --  4.9 3.8*  NEUTROABS 2.1 5.2 5.6  --   --  3.2  HGB 9.0* 8.4* 7.2* 9.2* 8.3* 8.0*  HCT 27.8* 26.7* 22.5* 26.5* 26.1* 24.9*  MCV 91.4 92.7 91.5  --  92.2 92.2  PLT 186 186 164  --  141* 138*    Basic Metabolic Panel: Recent Labs  Lab 04/25/22 1030 04/26/22 2042 05/13/2022 1031 04/29/22 0624  NA 137 131* 130* 133*  K 3.6 3.7 3.8 3.3*  CL 111 107 106 109  CO2 18* 19* 17* 16*  GLUCOSE 144* 111* 118* 97  BUN 5* 10 6 6   CREATININE 0.38* 0.39* 0.39* 0.36*  CALCIUM 7.9* 7.8* 7.8* 6.8*  MG  --   --  2.0  --     GFR: Estimated Creatinine Clearance: 80.9 mL/min (A) (by C-G formula based on SCr of 0.36 mg/dL (L)).  Liver Function Tests: Recent Labs  Lab 04/25/22 1030  04/26/22 2042 05/07/2022 1031 04/29/22 0624  AST 115* 227* 252* 312*  ALT 206* 274* 355* 402*  ALKPHOS 804* 801* 984* 887*  BILITOT 0.8 1.4* 1.6* 1.5*  PROT 5.5* 5.8* 6.0* 5.2*  ALBUMIN 3.3* 3.0* 3.0* 2.4*    CBG: No results for input(s): "GLUCAP" in the last 168 hours.   Recent Results (from the past 240 hour(s))  CSF culture w Gram Stain     Status: None (Preliminary result)   Collection Time: 04/27/2022  2:22 PM   Specimen: CSF; Cerebrospinal Fluid  Result Value Ref Range Status   Specimen Description CSF  Final   Special Requests Immunocompromised  Final   Gram Stain   Final    WBC PRESENT,BOTH PMN AND MONONUCLEAR NO ORGANISMS SEEN CYTOSPIN SMEAR    Culture   Final    NO GROWTH < 24 HOURS Performed at [facility], [address], [place], [place] [address]    Report Status PENDING  Incomplete         Radiology Studies:  DG FL GUIDED LUMBAR PUNCTURE  Result Date: 04/25/2022 CLINICAL DATA:  Metastatic breast cancer. Concern for leptomeningeal disease. EXAM: LUMBAR PUNCTURE UNDER FLUOROSCOPY PROCEDURE: An appropriate skin entry site was determined fluoroscopically. Operator donned sterile gloves and mask. Skin site was marked, then prepped with Betadine, draped in usual sterile fashion, and infiltrated locally with 1% lidocaine. A 20 gauge spinal needle advanced into the thecal sac at L4-L5 from a right interlaminar approach. Clear colorless CSF spontaneously returned, with opening pressure of 33 cm water. 15 ml CSF were collected and divided among 4 sterile vials for the requested laboratory studies. The needle was then removed. The patient tolerated the procedure well and there were no complications. FLUOROSCOPY: Radiation Exposure Index (as provided by the fluoroscopic device): 0.7 mGy Kerma IMPRESSION: Technically successful lumbar puncture under fluoroscopy. This exam was performed by Loyce DysKacie Matthews, and was supervised and interpreted by Malachi ProWilliam Derry MD.  Electronically Signed   By: Obie DredgeWilliam T Derry M.D.   On: 04/30/2022 15:10   MR Brain W and Wo Contrast  Result Date: 05/11/2022 CLINICAL DATA:  Provided history: Headache, increasing frequency or severity. Additional history obtained from electronic MEDICAL RECORD NUMBERStage IV breast cancer. EXAM: MRI HEAD WITHOUT AND WITH CONTRAST TECHNIQUE: Multiplanar, multiecho pulse sequences of the brain and surrounding structures were obtained without and with intravenous contrast. CONTRAST:  5mL GADAVIST GADOBUTROL 1 MMOL/ML IV SOLN COMPARISON:  Head CT 04/26/2022.  Brain MRI 03/07/2022. FINDINGS: Brain: No age advanced or lobar predominant parenchymal atrophy. 1.4 cm focus of enhancement within or along the right postcentral sulcus with adjacent cortical edema (for instance as seen on series 10, images 41-45) (series 12, image 12) (series 6, image 29). 1.3 cm focus of enhancement within or along the inferomedial left parietal lobe with adjacent cortical edema (for instance as seen on series 10, image 34) (series 12, image 10). Foci of diffusion-weighted and T2 FLAIR hyperintense signal abnormality within the bilateral frontoparietal white matter measuring up to 2.3 cm. There is T2 shine through at the majority of these sites. However, there is a 7 mm focus within the anterior right frontal lobe white matter which demonstrates true restricted diffusion (series 2, image 33) (series 250, image 33), and a 6 mm focus within the posterior left frontal lobe white matter which demonstrates true restricted diffusion. Additionally, there is a 3 mm focus of restricted diffusion and T2 FLAIR hyperintense signal abnormality within the left occipital cortex (series 2, image 30). There is no corresponding enhancement at these sites. These are favored to reflect infarcts, with the foci in the anterior right frontal lobe white matter, posterior left frontal lobe white matter and left occipital cortex being acute, and the remainder subacute to  chronic. Mildly prominent enhancement along the bilateral seventh and eighth cranial nerves within the internal auditory canals (series 10, images 16 and 17). Chronic microhemorrhage within the right parietal lobe (series 7, image 86). A previously demonstrated right subdural hematoma is no longer present. No extra-axial fluid collection. No midline shift. Vascular: Maintained flow voids within the proximal large arterial vessels. Skull and upper cervical spine: No suspicious marrow lesion. Sinuses/Orbits: No mass or acute finding within the imaged orbits. No significant paranasal sinus disease. IMPRESSION: 1. 1.4 cm focus of enhancement within or along the right postcentral sulcus with adjacent cortical edema. 1.3 cm focus of enhancement within or along the medial left parietal lobe with adjacent cortical edema. While these may reflect subacute infarcts, leptomeningeal metastatic disease cannot be excluded. Correlation with CSF analysis  and MR imaging of the spine should be considered. Additionally, a short-interval follow-up brain MRI (without and with contrast) is recommended in 6-8 weeks. 2. Multiple foci of signal abnormality within the bilateral frontoparietal lobes measuring up to 2.3 cm, likely reflecting infarcts of various age. Foci within the anterior right frontal lobe white matter, posterior left frontal lobe white matter and left occipital cortex are most compatible with acute infarcts. The remainder are likely subacute to chronic. Findings are suspicious for an embolic process. 3. Mildly prominent enhancement along the bilateral seventh and eighth cranial nerves within the internal auditory canals. Findings are indeterminate for leptomeningeal metastatic disease versus prominent vascular enhancement. Consider utilizing an internal auditory canal protocol at the time of brain MRI follow-up. 4. Chronic microhemorrhage within the right parietal lobe. 5. Previously demonstrated right subdural hematoma no  longer present. Electronically Signed   By: Jackey Loge D.O.   On: 05/04/2022 12:43        Scheduled Meds:  aspirin  81 mg Oral Daily   pantoprazole  40 mg Oral BID   saccharomyces boulardii  250 mg Oral BID   vancomycin  125 mg Oral TID AC & HS   Continuous Infusions:  sodium chloride 75 mL/hr at 04/29/22 0726     LOS: 1 day      Huey Bienenstock, MD [facility]   To contact the attending provider between 7A-7P or the covering provider during after hours 7P-7A, please log into the web site [email or web address] and access using universal [place] password for that web site. If you do not have the password, please call the hospital operator.  04/29/2022, 10:20 AM

## 2022-04-29 NOTE — Progress Notes (Signed)
Echocardiogram 2D Echocardiogram has been performed.  Toni Amend 04/29/2022, 9:07 AM

## 2022-04-29 NOTE — Progress Notes (Signed)
Bilateral lower extremity venous study completed.   Preliminary results relayed to RN.  Please see CV Procedures for preliminary results.  Chandni Gagan, RVT  1:19 PM 04/29/22

## 2022-04-29 NOTE — Progress Notes (Signed)
Called by nursing, patient with nosebleeds.  Relatively easily stopped.  Lovenox DC'd.  SCDs substituted.

## 2022-04-29 NOTE — Progress Notes (Signed)
Subjective: Continues to have mild headache, no further blurred vision  Exam: Vitals:   04/29/22 1251 04/29/22 1600  BP: (!) 142/91 119/87  Pulse: 75 92  Resp: 14 16  Temp: (!) 97.4 F (36.3 C) 98 F (36.7 C)  SpO2: 99% 100%   Gen: In bed, NAD Resp: non-labored breathing, no acute distress Abd: soft, nt  Neuro: MS: Awake, alert, oriented and appropriate CN: EOMI, face symmetric Motor: 5/5 throughout Sensory: Intact to light touch  Pertinent Labs: LDL 111  Impression: 37 year old female with multifocal strokes as well as leptomeningeal carcinomatosis.  I suspect that she is hypercoagulable from her stroke, and therefore if ENT is able to treat the hemorrhagic lesion causing her nosebleeds, I would favor anticoagulation, would use heparin for the time being pending decisions about treatment of her leptomeningeal carcinomatosis.  If she does have continued nosebleeding, then I think we would be obligated to hold anticoagulation.  For her leptomeningeal carcinomatosis, this is a very unfortunate finding.  This can be very difficult to treat, and I would defer to her oncology team as far as approaches to management of this versus pursuing palliative care.  I discussed with her that she did have malignant cells in her spinal fluid and that though this was very unfortunate I would defer to her oncology team for further guidance on treatment options.  Recommendations: 1) I do not feel her strokes are atherogenic and therefore do not feel that statin is indicated despite elevated LDL 2) from a neurological standpoint, I feel that anticoagulation would be safe, if she continues to have nosebleeding, though, we would likely be obligated to abort this. 3) defer treatment of leptomeningeal carcinomatosis to oncology 4) neurology will continue to follow  Ritta Slot, MD [facility] [phone]  If 7pm- 7am, please page neurology on call as listed in AMION.

## 2022-04-29 NOTE — Consult Note (Signed)
Referring Provider: Dr. Huey Bienenstock Primary Care Physician:  Bradd Canary, MD Primary Gastroenterologist:  Dr. Erick Blinks  Reason for Consultation:  Elevated LFTs  HPI: Carla Clark is a 37 y.o. 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 Diff 03/2022, iron deficiency anemia, B12 deficiency, colon polyps and metastatic breast cancer treated started on Taxol/Gemzar/Pembrolizumab 03/02/2022 followed by Dr. Myna Hidalgo.  She presented to the ED 04/26/2022 with headache and nosebleeds.  Labs in the ED showed a WBC count of 7.3.  Hemoglobin 8.4 down from 9.0.  Hematocrit 26.7.  Platelet 186.  INR 1.2.  Sodium 131.  Potassium 3.7.  BUN 10.  Creatinine 0.39.  Total bili 1.4.  Alk phos 801.  AST 227.  ALT 274.  Albumin 3.0.  Brain MRI showed multiple acute/acute CVAs.  On ASA 81 mg daily. S/P spinal tap, CSF findings for malignant cells, more concerning for meningeal spread.   She was seen by ENT and underwent bilateral nasal cautery for epistaxis with successful hemostasis.  Labs today showed a total bilirubin level of 1.5.  Alk phos 887.  AST 312.  ALT 402.  Comparison labs 04/04/2022: T. Bili 0.9.  Alk phos 372.  AST 80.  ALT 183. Comparison labs 04/18/2022:  T. Bili 1.0.  Alk phos 413.  AST 182.  ALT 390.  Labs 02/24/2022: Total bili 0.9.  Alk phos 86.  AST 106.  ALT 100.  Labs 03/09/2022: Acute hepatitis panel was negative.  CK1 37.  A GI consult was requested for further evaluation regarding elevated LFTs since 02/2022.   She denies having any history of elevated LFTs or liver disease prior to 02/2022.  No known family history of liver disease.  She has intermittent nausea without vomiting.  Occasional heartburn for which she takes Pantoprazole 40 mg as needed. She  was recently diagnosed with C. difficile infection and remains on oral Vancomycin. She passed a loose mud like BM earlier today.  No bloody stools.  No NSAID use.  No excessive Tylenol use.  No alcohol use.  Regarding her Crohn's disease, she was previously treated with Humira and Azathioprine then switched to infliximab. Infliximab was started after enterocolonic fistula repair and right hemicolectomy in August 2021. Her last dose of infliximab was in September 2023 and she has had 2 of 3 induction infusions with [employer]. She was changed to [facility] because of ongoing eczema and the hypothesis that infliximab was worsening her skin.  She was last seen by Dr. Rhea Belton in office 04/12/2022 and at that time her liver enzymes were elevated (T. Bili 0.9. Alk phos 297. AST 118. ALT 225) thought to be secondary to chemotherapy as opposed to [employer]. Cristy Folks was placed on hold and abdominal MRI/20 02/2022 showed a normal liver without evidence of hepatic metastasis. Skyrizi remained on hold and she was instructed to follow-up with oncology to determine if her LFTs were related to chemotherapy.  Past Medical History:  Diagnosis Date   Anal fissure    Anemia 11/2011   iron deficiency. 01/2013 parenteral iron. Intranasal B12.  Dr Myna Hidalgo follows.    B12 deficiency 11/2012   Breast cancer    Breast cancer metastasized to multiple sites, left 02/02/2022   Breast mass in female 04/21/2012   Cervical cancer screening 01/27/2012   Chicken pox as a  child   Crohn's disease 2013   ileitis 10/2011. ileitis, mesenteric abscess, ? entero-enteric fistula on 08/2012 CT enterography   Dermatitis 08/22/2016   GERD (gastroesophageal reflux disease)    Hepatitis B immune 07/2012   previous vaccination.    Hives 09/2014   per Dermatologist.  Rx Zyrtec, Benadryl.    Ileitis    Serrated polyp of colon    Small bowel obstruction    SVD (spontaneous vaginal delivery) 08/13/2018   Vitamin D deficiency 2014    Past Surgical  History:  Procedure Laterality Date   LAPAROSCOPIC RIGHT HEMI COLECTOMY  07/11/2019   WISDOM TOOTH EXTRACTION  37 yrs old    Prior to Admission medications   Medication Sig Start Date End Date Taking? Authorizing Provider  cefdinir (OMNICEF) 300 MG capsule Take 2 capsules (600 mg total) by mouth daily. 03/28/22  Yes Erenest Blank, NP  docusate sodium (COLACE) 100 MG capsule Take 1 capsule (100 mg total) by mouth 2 (two) times daily. Patient taking differently: Take 100 mg by mouth daily as needed for mild constipation. 03/11/22  Yes Swayze, Ava, DO  hydrocortisone (ANUSOL-HC) 2.5 % rectal cream Place 1 Application rectally 2 (two) times daily. 11/10/21  Yes Pyrtle, Carie Caddy, MD  lidocaine-prilocaine (EMLA) cream Apply 1 Application topically See admin instructions. Apply a dime-size of cream to port-a-cath 1-2 hours prior to access. Cover with [employer]. 02/14/22  Yes [provider]  LORazepam (ATIVAN) 0.5 MG tablet Take 1 tablet (0.5 mg total) by mouth every 6 (six) hours as needed for anxiety. 04/22/22 05/22/22 Yes Ennever, Rose Phi, MD  melatonin 3 MG TABS tablet Take 3 mg by mouth at bedtime as needed (For sleep).   Yes [provider]  ondansetron (ZOFRAN) 8 MG tablet Take 1 tablet (8 mg total) by mouth every 8 (eight) hours as needed for nausea or vomiting. 03/01/22  Yes Josph Macho, MD  pantoprazole (PROTONIX) 40 MG tablet Take 1 tablet (40 mg total) by mouth 2 (two) times daily. Patient taking differently: Take 40 mg by mouth daily as needed (For acid reflux). 03/28/22  Yes Erenest Blank, NP  polyethylene glycol (MIRALAX / GLYCOLAX) 17 g packet Take 17 g by mouth daily as needed for mild constipation. 03/11/22  Yes Swayze, Ava, DO  prochlorperazine (COMPAZINE) 10 MG tablet Take 1 tablet (10 mg total) by mouth every 6 (six) hours as needed for nausea or vomiting. 03/01/22  Yes Josph Macho, MD  pyridoxine (NEURO-K-250 VITAMIN B6) 250 MG tablet Take 2 tablets (500 mg total)  by mouth daily. 02/24/22  Yes Ennever, Rose Phi, MD  traMADol (ULTRAM) 50 MG tablet Take 50 mg by mouth every 6 (six) hours as needed (for pain).   Yes [provider]  vitamin B-12 (CYANOCOBALAMIN) 100 MCG tablet Take 100 mcg by mouth daily.   Yes [provider]  fluconazole (DIFLUCAN) 100 MG tablet Take 1 tablet (100 mg total) by mouth daily. Patient not taking: Reported on 05/02/2022 03/28/22   Erenest Blank, NP  methylPREDNISolone (MEDROL DOSEPAK) 4 MG TBPK tablet Take as directed Patient not taking: Reported on 05/15/2022 04/18/22   Josph Macho, MD  predniSONE (DELTASONE) 10 MG tablet Take 8 tablets (80 mg total) by mouth daily with breakfast. Patient not taking: Reported on 04/29/2022 03/28/22   Erenest Blank, NP  vancomycin (VANCOCIN) 125 MG capsule Take 1 capsule (125 mg total) by mouth 4 (four) times daily. Patient not taking: Reported on  May 07, 2022 04/15/22   Beverley Fiedler, MD    Current Facility-Administered Medications  Medication Dose Route Frequency Provider Last Rate Last Admin   0.9 %  sodium chloride infusion   Intravenous Continuous Elgergawy, Leana Roe, MD 50 mL/hr at 04/29/22 1119 Rate Change at 04/29/22 1119   acetaminophen (TYLENOL) tablet 650 mg  650 mg Oral Q4H PRN Clydie Braun, MD       Or   acetaminophen (TYLENOL) 160 MG/5ML solution 650 mg  650 mg Per Tube Q4H PRN Madelyn Flavors A, MD       Or   acetaminophen (TYLENOL) suppository 650 mg  650 mg Rectal Q4H PRN Madelyn Flavors A, MD       aspirin chewable tablet 81 mg  81 mg Oral Daily Gevena Mart A, NP   81 mg at 05/07/2022 1812   Chlorhexidine Gluconate Cloth 2 % PADS 6 each  6 each Topical Daily Elgergawy, Leana Roe, MD   6 each at 04/29/22 1119   diphenhydrAMINE (BENADRYL) injection 12.5 mg  12.5 mg Intravenous Q6H PRN Madelyn Flavors A, MD       And   prochlorperazine (COMPAZINE) injection 10 mg  10 mg Intravenous Q6H PRN Madelyn Flavors A, MD       HYDROcodone-acetaminophen (NORCO/VICODIN) 5-325 MG  per tablet 1 tablet  1 tablet Oral Q6H PRN Madelyn Flavors A, MD   1 tablet at 04/29/22 0957   lidocaine-prilocaine (EMLA) cream 1 Application  1 Application Topical PRN Madelyn Flavors A, MD       LORazepam (ATIVAN) tablet 0.5 mg  0.5 mg Oral Q6H PRN Madelyn Flavors A, MD       melatonin tablet 3 mg  3 mg Oral QHS PRN Madelyn Flavors A, MD   3 mg at 05/07/22 2138   pantoprazole (PROTONIX) EC tablet 40 mg  40 mg Oral BID Madelyn Flavors A, MD   40 mg at 04/29/22 9147   potassium chloride SA (KLOR-CON M) CR tablet 40 mEq  40 mEq Oral Q6H Elgergawy, Leana Roe, MD   40 mEq at 04/29/22 1119   saccharomyces boulardii (FLORASTOR) capsule 250 mg  250 mg Oral BID Madelyn Flavors A, MD   250 mg at 04/29/22 8295   vancomycin (VANCOCIN) capsule 125 mg  125 mg Oral TID AC & HS Elgergawy, Leana Roe, MD   125 mg at 04/29/22 1000    Allergies as of 05/07/22 - Review Complete 05/07/22  Allergen Reaction Noted   Other Other (See Comments) 03/07/2022    Family History  Problem Relation Age of Onset   Nephrolithiasis Father    Brain cancer Father    Other Father        anaplastic astrocytoma   Ovarian cancer Paternal Aunt        ovarian   Breast cancer Paternal Aunt    Cancer Paternal Aunt        breast (2019) and ovarian cancer (2005)   Other Paternal Uncle 81       cyst removed from pancreas   Pancreatic cancer Paternal Uncle        Stage 1   Cancer Paternal Uncle        pancreatic   Lung cancer Maternal Grandmother    Gout Maternal Grandfather    Heart attack Maternal Grandfather    Ovarian cancer Paternal Grandmother    Cancer Paternal Grandmother 28       ovarian   Pancreatic cancer Paternal Grandfather    Colon cancer  Neg Hx    Stomach cancer Neg Hx    Esophageal cancer Neg Hx     Social History   Socioeconomic History   Marital status: Married    Spouse name: Not on file   Number of children: 2   Years of education: Not on file   Highest education level: Not on file  Occupational  History   Occupation: Magazine features editor: [employer]  Tobacco Use   Smoking status: Never   Smokeless tobacco: Never   Tobacco comments:    never used tobacco  Vaping Use   Vaping Use: Never used  Substance and Sexual Activity   Alcohol use: Not Currently    Alcohol/week: 0.0 standard drinks of alcohol    Comment: occasionaly   Drug use: No   Sexual activity: Yes    Partners: Male    Birth control/protection: None  Other Topics Concern   Not on file  Social History Narrative   Not on file   Social Determinants of Health   Financial Resource Strain: Medium Risk (01/13/2022)   Overall Financial Resource Strain (CARDIA)    Difficulty of Paying Living Expenses: Somewhat hard  Food Insecurity: No Food Insecurity (01/13/2022)   Hunger Vital Sign    Worried About Running Out of Food in the Last Year: Never true    Ran Out of Food in the Last Year: Never true  Transportation Needs: No Transportation Needs (01/13/2022)   PRAPARE - Administrator, Civil Service (Medical): No    Lack of Transportation (Non-Medical): No  Physical Activity: Not on file  Stress: Not on file  Social Connections: Not on file  Intimate Partner Violence: Not At Risk (01/13/2022)   Humiliation, Afraid, Rape, and Kick questionnaire    Fear of Current or Ex-Partner: No    Emotionally Abused: No    Physically Abused: No    Sexually Abused: No    Review of Systems: Gen: + Weight loss. CV: Denies chest pain, palpitations or edema. Resp: Denies cough, shortness of breath of hemoptysis.  GI: See HPI. No GERD symptoms.    GU : Denies urinary burning, blood in urine, increased urinary frequency or incontinence. MS: Denies joint pain, muscles aches or weakness. Derm: Denies rash, itchiness, skin lesions or unhealing ulcers. Psych: + Depression. Heme: + Easy bruising.  Neuro:  Denies headaches, dizziness or paresthesias. Endo:  Denies any problems with DM, thyroid or adrenal  function.  Physical Exam: Vital signs in last 24 hours: Temp:  [97.5 F (36.4 C)-98.1 F (36.7 C)] 97.7 F (36.5 C) (04/05 0951) Pulse Rate:  [72-99] 85 (04/05 0951) Resp:  [13-17] 15 (04/05 0951) BP: (105-126)/(64-86) 126/86 (04/05 0951) SpO2:  [98 %-100 %] 99 % (04/05 0951) Last BM Date : 05/22/2022 General: Chronically ill 37 year old female in no acute distress. Head:  Normocephalic and atraumatic. Eyes:  No scleral icterus. Conjunctiva pink. Ears:  Normal auditory acuity. Nose: Left nasal packing intact. Mouth:  Dentition intact. No ulcers or lesions.  Neck:  Supple. No lymphadenopathy or thyromegaly.  Lungs: Breath sounds clear throughout. No wheezes, rhonchi or crackles.  Heart: Rate and rhythm, no murmurs. Abdomen: Soft, nondistended.  Nontender.  Positive bowel sounds to all 4 quadrants. Rectal: Deferred. Musculoskeletal:  Symmetrical without gross deformities.  Pulses:  Normal pulses noted. Extremities:  Without clubbing or edema. Neurologic:  Alert and  oriented x 4. No focal deficits.  Skin:  Intact without significant lesions or rashes. Psych:  Alert  and cooperative. Normal mood and affect.  Intake/Output from previous day: 04/04 0701 - 04/05 0700 In: 2109.8 [I.V.:902.1; IV Piggyback:1207.7] Out: 600 [Urine:600] Intake/Output this shift: Total I/O In: 283 [I.V.:86.5; IV Piggyback:196.5] Out: -   Lab Results: Recent Labs    05/16/2022 1031 05/06/2022 1826 05/14/2022 2140 04/29/22 0624  WBC 6.8  --  4.9 3.8*  HGB 7.2* 9.2* 8.3* 8.0*  HCT 22.5* 26.5* 26.1* 24.9*  PLT 164  --  141* 138*   BMET Recent Labs    04/26/22 2042 05/21/2022 1031 04/29/22 0624  NA 131* 130* 133*  K 3.7 3.8 3.3*  CL 107 106 109  CO2 19* 17* 16*  GLUCOSE 111* 118* 97  BUN 10 6 6   CREATININE 0.39* 0.39* 0.36*  CALCIUM 7.8* 7.8* 6.8*   LFT Recent Labs    04/29/22 0624  PROT 5.2*  ALBUMIN 2.4*  AST 312*  ALT 402*  ALKPHOS 887*  BILITOT 1.5*   PT/INR Recent Labs     04/26/22 2042  LABPROT 14.9  INR 1.2   Hepatitis Panel No results for input(s): "HEPBSAG", "HCVAB", "HEPAIGM", "HEPBIGM" in the last 72 hours.    Studies/Results: ECHOCARDIOGRAM COMPLETE  Result Date: 04/29/2022    ECHOCARDIOGRAM REPORT   Patient Name:   JOVEE EARNEY Date of Exam: 04/29/2022 Medical Rec #:  [id number]      Height:       63.0 in Accession #:    [id number]     Weight:       114.0 lb Date of Birth:  04-Oct-1985       BSA:          1.523 m Patient Age:    36 years       BP:           122/75 mmHg Patient Gender: F              HR:           85 bpm. Exam Location:  Inpatient Procedure: 2D Echo, Cardiac Doppler, Color Doppler, Strain Analysis and 3D Echo Indications:    stroke  History:        Patient has no prior history of Echocardiogram examinations.  Sonographer:    Mike Gip Referring Phys: [phone] DENISE A WOLFE IMPRESSIONS  1. Left ventricular ejection fraction, by estimation, is 65 to 70%. Left ventricular ejection fraction by 3D volume is 65 %. The left ventricle has normal function. The left ventricle has no regional wall motion abnormalities. Left ventricular diastolic  parameters were normal. The average left ventricular global longitudinal strain is -20.6 %. The global longitudinal strain is normal.  2. Right ventricular systolic function is normal. The right ventricular size is normal. There is normal pulmonary artery systolic pressure. The estimated right ventricular systolic pressure is 33.6 mmHg.  3. The mitral valve is normal in structure. Mild mitral valve regurgitation. No evidence of mitral stenosis.  4. The aortic valve is normal in structure. Aortic valve regurgitation is not visualized. No aortic stenosis is present.  5. The inferior vena cava is dilated in size with >50% respiratory variability, suggesting right atrial pressure of 8 mmHg. FINDINGS  Left Ventricle: Left ventricular ejection fraction, by estimation, is 65 to 70%. Left ventricular ejection fraction by 3D  volume is 65 %. The left ventricle has normal function. The left ventricle has no regional wall motion abnormalities. The average left ventricular global longitudinal strain is -20.6 %. The global longitudinal strain is normal. The left  ventricular internal cavity size was normal in size. There is no left ventricular hypertrophy. Left ventricular diastolic parameters were normal. Normal left ventricular filling pressure. Right Ventricle: The right ventricular size is normal. No increase in right ventricular wall thickness. Right ventricular systolic function is normal. There is normal pulmonary artery systolic pressure. The tricuspid regurgitant velocity is 2.53 m/s, and  with an assumed right atrial pressure of 8 mmHg, the estimated right ventricular systolic pressure is 33.6 mmHg. Left Atrium: Left atrial size was normal in size. Right Atrium: Right atrial size was normal in size. Pericardium: There is no evidence of pericardial effusion. Mitral Valve: The mitral valve is normal in structure. Mild mitral valve regurgitation. No evidence of mitral valve stenosis. Tricuspid Valve: The tricuspid valve is normal in structure. Tricuspid valve regurgitation is trivial. No evidence of tricuspid stenosis. Aortic Valve: The aortic valve is normal in structure. Aortic valve regurgitation is not visualized. No aortic stenosis is present. Pulmonic Valve: The pulmonic valve was normal in structure. Pulmonic valve regurgitation is trivial. No evidence of pulmonic stenosis. Aorta: The aortic root is normal in size and structure. Venous: The inferior vena cava is dilated in size with greater than 50% respiratory variability, suggesting right atrial pressure of 8 mmHg. IAS/Shunts: No atrial level shunt detected by color flow Doppler.  LEFT VENTRICLE PLAX 2D LVIDd:         4.80 cm         Diastology LVIDs:         2.70 cm         LV e' medial:    12.50 cm/s LV PW:         0.80 cm         LV E/e' medial:  9.8 LV IVS:        0.80 cm          LV e' lateral:   16.00 cm/s                                LV E/e' lateral: 7.6  LV Volumes (MOD)               2D LV vol d, MOD    130.0 ml      Longitudinal A2C:                           Strain LV vol d, MOD    117.0 ml      2D Strain GLS  -22.6 % A4C:                           (A2C): LV vol s, MOD    37.4 ml       2D Strain GLS  -18.7 % A2C:                           (A3C): LV vol s, MOD    34.1 ml       2D Strain GLS  -25.0 % A4C:                           (A4C): LV SV MOD A2C:   92.6 ml       2D Strain GLS  -20.6 % LV SV MOD A4C:   117.0 ml  Avg: LV SV MOD BP:    87.3 ml                                3D Volume EF                                LV 3D EF:    Left                                             ventricul                                             ar                                             ejection                                             fraction                                             by 3D                                             volume is                                             65 %.                                 3D Volume EF:                                3D EF:        65 %                                LV EDV:       140 ml                                LV ESV:       50 ml                                LV SV:        91  ml RIGHT VENTRICLE             IVC RV Basal diam:  3.40 cm     IVC diam: 2.40 cm RV S prime:     18.60 cm/s TAPSE (M-mode): 2.4 cm LEFT ATRIUM             Index        RIGHT ATRIUM          Index LA diam:        3.30 cm 2.17 cm/m   RA Area:     9.87 cm LA Vol (A2C):   48.9 ml 32.11 ml/m  RA Volume:   16.90 ml 11.10 ml/m LA Vol (A4C):   38.1 ml 25.02 ml/m LA Biplane Vol: 44.0 ml 28.90 ml/m  AORTIC VALVE LVOT Vmax:   116.00 cm/s LVOT Vmean:  83.400 cm/s LVOT VTI:    0.266 m  AORTA Ao Asc diam: 2.50 cm MITRAL VALVE                  TRICUSPID VALVE MV Area (PHT): 3.13 cm       TR Peak grad:   25.6 mmHg MV Decel Time: 242 msec       TR Vmax:         253.00 cm/s MR Peak grad:    120.6 mmHg MR Mean grad:    81.0 mmHg    SHUNTS MR Vmax:         549.00 cm/s  Systemic VTI: 0.27 m MR Vmean:        428.0 cm/s MR PISA:         0.57 cm MR PISA Eff ROA: 4 mm MR PISA Radius:  0.30 cm MV E velocity: 122.00 cm/s MV A velocity: 95.50 cm/s MV E/A ratio:  1.28 Armanda Magic MD Electronically signed by Armanda Magic MD Signature Date/Time: 04/29/2022/11:12:54 AM    Final    DG FL GUIDED LUMBAR PUNCTURE  Result Date: May 02, 2022 CLINICAL DATA:  Metastatic breast cancer. Concern for leptomeningeal disease. EXAM: LUMBAR PUNCTURE UNDER FLUOROSCOPY PROCEDURE: An appropriate skin entry site was determined fluoroscopically. Operator donned sterile gloves and mask. Skin site was marked, then prepped with Betadine, draped in usual sterile fashion, and infiltrated locally with 1% lidocaine. A 20 gauge spinal needle advanced into the thecal sac at L4-L5 from a right interlaminar approach. Clear colorless CSF spontaneously returned, with opening pressure of 33 cm water. 15 ml CSF were collected and divided among 4 sterile vials for the requested laboratory studies. The needle was then removed. The patient tolerated the procedure well and there were no complications. FLUOROSCOPY: Radiation Exposure Index (as provided by the fluoroscopic device): 0.7 mGy Kerma IMPRESSION: Technically successful lumbar puncture under fluoroscopy. This exam was performed by Loyce Dys, and was supervised and interpreted by Malachi Pro MD. Electronically Signed   By: Obie Dredge M.D.   On: May 02, 2022 15:10   MR Brain W and Wo Contrast  Result Date: 2022-05-02 CLINICAL DATA:  Provided history: Headache, increasing frequency or severity. Additional history obtained from electronic MEDICAL RECORD NUMBERStage IV breast cancer. EXAM: MRI HEAD WITHOUT AND WITH CONTRAST TECHNIQUE: Multiplanar, multiecho pulse sequences of the brain and surrounding structures were obtained without and with intravenous  contrast. CONTRAST:  5mL GADAVIST GADOBUTROL 1 MMOL/ML IV SOLN COMPARISON:  Head CT 04/26/2022.  Brain MRI 03/07/2022. FINDINGS: Brain: No age advanced or lobar predominant parenchymal atrophy. 1.4 cm focus of enhancement within or along the right postcentral sulcus with  adjacent cortical edema (for instance as seen on series 10, images 41-45) (series 12, image 12) (series 6, image 29). 1.3 cm focus of enhancement within or along the inferomedial left parietal lobe with adjacent cortical edema (for instance as seen on series 10, image 34) (series 12, image 10). Foci of diffusion-weighted and T2 FLAIR hyperintense signal abnormality within the bilateral frontoparietal white matter measuring up to 2.3 cm. There is T2 shine through at the majority of these sites. However, there is a 7 mm focus within the anterior right frontal lobe white matter which demonstrates true restricted diffusion (series 2, image 33) (series 250, image 33), and a 6 mm focus within the posterior left frontal lobe white matter which demonstrates true restricted diffusion. Additionally, there is a 3 mm focus of restricted diffusion and T2 FLAIR hyperintense signal abnormality within the left occipital cortex (series 2, image 30). There is no corresponding enhancement at these sites. These are favored to reflect infarcts, with the foci in the anterior right frontal lobe white matter, posterior left frontal lobe white matter and left occipital cortex being acute, and the remainder subacute to chronic. Mildly prominent enhancement along the bilateral seventh and eighth cranial nerves within the internal auditory canals (series 10, images 16 and 17). Chronic microhemorrhage within the right parietal lobe (series 7, image 86). A previously demonstrated right subdural hematoma is no longer present. No extra-axial fluid collection. No midline shift. Vascular: Maintained flow voids within the proximal large arterial vessels. Skull and upper cervical  spine: No suspicious marrow lesion. Sinuses/Orbits: No mass or acute finding within the imaged orbits. No significant paranasal sinus disease. IMPRESSION: 1. 1.4 cm focus of enhancement within or along the right postcentral sulcus with adjacent cortical edema. 1.3 cm focus of enhancement within or along the medial left parietal lobe with adjacent cortical edema. While these may reflect subacute infarcts, leptomeningeal metastatic disease cannot be excluded. Correlation with CSF analysis and MR imaging of the spine should be considered. Additionally, a short-interval follow-up brain MRI (without and with contrast) is recommended in 6-8 weeks. 2. Multiple foci of signal abnormality within the bilateral frontoparietal lobes measuring up to 2.3 cm, likely reflecting infarcts of various age. Foci within the anterior right frontal lobe white matter, posterior left frontal lobe white matter and left occipital cortex are most compatible with acute infarcts. The remainder are likely subacute to chronic. Findings are suspicious for an embolic process. 3. Mildly prominent enhancement along the bilateral seventh and eighth cranial nerves within the internal auditory canals. Findings are indeterminate for leptomeningeal metastatic disease versus prominent vascular enhancement. Consider utilizing an internal auditory canal protocol at the time of brain MRI follow-up. 4. Chronic microhemorrhage within the right parietal lobe. 5. Previously demonstrated right subdural hematoma no longer present. Electronically Signed   By: Jackey Loge D.O.   On: 2022/05/12 12:43    IMPRESSION/PLAN:  37 year old female with metastatic breast cancer stage IV on Taxol/Gemzar/Pembrolizumab. Xgeva 120 mg sq q 3 months. Pembrolizumab (Keytruda) on hold. Last chemoinfusion was 04/25/2022.  -Continue follow-up with Dr. Myna Hidalgo  Elevated LFTs. Abdominal MRI 09/16/2022 showed a normal liver without evidence of metastasis. Elevated LFTs likely due to  chemotherapy/immunotherapy, less likely from [place].  Acute hepatitis panel 03/09/2022 was negative. -ANA, SMA, IgG, AMA, hep B core total antibody, hep B surface antibody, ceruloplasmin, alpha-1 antitrypsin a.m. -Hepatic panel, PT/INR in am -? Liver Doppler -Await further recommendations per Dr. Leonides Schanz  Multiple CVAs. ASA 81 mg QD. 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  Skyrizi on hold due to elevated LFTs. Currently not on any Crohn's treatment.  C. Diff infection 04/13/2022, remains on oral Vancomycin and Florastor probiotic  -Continue oral vancomycin  GERD -Continue pantoprazole 40 mg twice daily  Epistaxis secondary to thrombocytopenia/metastatic breast cancer/chemo.  S/P bilateral nasal cautery by ENT with left nasal packing.    Malachi Carl Kennedy-Smith  04/29/2022, 2:03PM

## 2022-04-29 NOTE — Progress Notes (Signed)
Concern for hypercoagulable state causing patient multifocal CVAs in the setting of her malignancy, full anticoagulation with heparin per oncology held initially pending ENT evaluation, now patient's status post cauterization , as well discussed with neurology, recommendation for full anticoagulation, I have discussed with patient, family including mother and brother at bedside and husband by phone, explained risks and benefits, and recommendation by both oncology and urology for full anticoagulation, they are agreeable to proceed with heparin GTT anticoagulation, will start heparin drip with no bolus and to keep on the lower therapeutic range, will discontinue aspirin.  Huey Bienenstock MD

## 2022-04-29 NOTE — Consult Note (Signed)
Carla Clark is well-known to me.  She is a very nice 37 year old white female.  She has metastatic breast cancer.  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  It is ER negative and PR negative.  She has been on treatment with Taxol/gemcitabine.  She does receive treatment I think 4 days ago.  Her CA 27.29 has been coming down nicely.  She has been having occasional nosebleeds and headaches.  Yesterday, she had a bad headache.  She then had some hematemesis.  She went to the emergency room.  She had MRI of the brain which was suggestive of subacute and acute strokes.  There is also issues suggestive of leptomeningeal disease.  She did have a spinal tap yesterday.  The cytology is not yet back.  However, the glucose was only 20 and the protein was 50 which is somewhat concerning.  She has not complained of any kind of pain.  There is no visual deficits.  She is alert and oriented.  She is being followed by Neurology.  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  I think she did not go for echocardiogram today.  Her labs today show white count 3.8.  Hemoglobin 8.  Platelet count 138,000.  When she came in yesterday, her SGPT was 355 and SGOT was 252.  Alkaline phosphatase was 984.  She has had no fever.  She has been eating okay.  She denies any nausea or vomiting.  There is been no problems with bowel or bladder incontinence.  She has had no cough or shortness of breath.   Her vital signs are temperature 97.6.  Pulse 85.  Blood pressure 122/75.  Her head neck exam shows no ocular or oral lesions.  There is no mucositis.  She has no adenopathy on the neck.  Lungs are clear bilaterally.  Cardiac exam regular rate and rhythm.  I really cannot hear any murmurs.  Abdomen is soft.  Bowel sounds are present.  There is no guarding or rebound tenderness.  Extremity shows no clubbing, cyanosis or edema.  He has good range of motion of her joints.  She has good strength in upper lower extremities.   Neurological exam shows no focal neurological deficits.    Carla Clark has metastatic breast cancer.  She has had chemotherapy.  I do think she is responding by the decrease in her CA 27.29.  In addition, she has decrease in the mass in the left breast.  However, she clearly is at risk for CNS disease as chemotherapy does not have good penetration into the CNS.  We will have to await the results from the cytology.  She, in my opinion, clearly needs to have ENT see her to see why she has these nosebleeds.  Her platelet count is fine.  Her coagulation parameters are fine.  We really need to see if there is a vessel that needs to be cauterized.  I do believe that she is going to need anticoagulation.  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  She clearly is hypercoagulable.  I realize that she has nosebleeds.  However, I probably would want to get her on heparin.  I would then see about switching her over to an oral agent.  Her hemoglobin is on the lower side.  She may need to be transfused.  This would certainly help with any bleeding.  I know this is very difficult and very challenging.  She is doing everything that we have asked her to do.  I just hate that she keeps having difficulty with this malignancy.  I do appreciate everybody's help.  I know that she will get outstanding care that is compassionate for everybody up on [facility].  Christin Bach, MD  Carla Clark 29:11

## 2022-04-30 DIAGNOSIS — R040 Epistaxis: Secondary | ICD-10-CM | POA: Diagnosis not present

## 2022-04-30 DIAGNOSIS — R519 Headache, unspecified: Secondary | ICD-10-CM | POA: Diagnosis not present

## 2022-04-30 DIAGNOSIS — C50919 Malignant neoplasm of unspecified site of unspecified female breast: Secondary | ICD-10-CM | POA: Diagnosis not present

## 2022-04-30 DIAGNOSIS — K920 Hematemesis: Secondary | ICD-10-CM | POA: Diagnosis not present

## 2022-04-30 DIAGNOSIS — G96198 Other disorders of meninges, not elsewhere classified: Secondary | ICD-10-CM | POA: Diagnosis not present

## 2022-04-30 DIAGNOSIS — C50912 Malignant neoplasm of unspecified site of left female breast: Secondary | ICD-10-CM | POA: Diagnosis not present

## 2022-04-30 DIAGNOSIS — Z7901 Long term (current) use of anticoagulants: Secondary | ICD-10-CM | POA: Diagnosis not present

## 2022-04-30 DIAGNOSIS — R945 Abnormal results of liver function studies: Secondary | ICD-10-CM | POA: Diagnosis not present

## 2022-04-30 DIAGNOSIS — I639 Cerebral infarction, unspecified: Secondary | ICD-10-CM | POA: Diagnosis not present

## 2022-04-30 LAB — PROTIME-INR
INR: 1.2 (ref 0.8–1.2)
Prothrombin Time: 15.3 seconds — ABNORMAL HIGH (ref 11.4–15.2)

## 2022-04-30 LAB — CBC WITH DIFFERENTIAL/PLATELET
Abs Immature Granulocytes: 0.03 10*3/uL (ref 0.00–0.07)
Basophils Absolute: 0 10*3/uL (ref 0.0–0.1)
Basophils Relative: 1 %
Eosinophils Absolute: 0 10*3/uL (ref 0.0–0.5)
Eosinophils Relative: 0 %
HCT: 25 % — ABNORMAL LOW (ref 36.0–46.0)
Hemoglobin: 8.3 g/dL — ABNORMAL LOW (ref 12.0–15.0)
Immature Granulocytes: 1 %
Lymphocytes Relative: 52 %
Lymphs Abs: 1.4 10*3/uL (ref 0.7–4.0)
MCH: 29.9 pg (ref 26.0–34.0)
MCHC: 33.2 g/dL (ref 30.0–36.0)
MCV: 89.9 fL (ref 80.0–100.0)
Monocytes Absolute: 0 10*3/uL — ABNORMAL LOW (ref 0.1–1.0)
Monocytes Relative: 2 %
Neutro Abs: 1.2 10*3/uL — ABNORMAL LOW (ref 1.7–7.7)
Neutrophils Relative %: 44 %
Platelets: 146 10*3/uL — ABNORMAL LOW (ref 150–400)
RBC: 2.78 MIL/uL — ABNORMAL LOW (ref 3.87–5.11)
RDW: 23.7 % — ABNORMAL HIGH (ref 11.5–15.5)
WBC: 2.7 10*3/uL — ABNORMAL LOW (ref 4.0–10.5)
nRBC: 0 % (ref 0.0–0.2)

## 2022-04-30 LAB — COMPREHENSIVE METABOLIC PANEL
ALT: 444 U/L — ABNORMAL HIGH (ref 0–44)
AST: 271 U/L — ABNORMAL HIGH (ref 15–41)
Albumin: 2.7 g/dL — ABNORMAL LOW (ref 3.5–5.0)
Alkaline Phosphatase: 1002 U/L — ABNORMAL HIGH (ref 38–126)
Anion gap: 7 (ref 5–15)
BUN: 5 mg/dL — ABNORMAL LOW (ref 6–20)
CO2: 17 mmol/L — ABNORMAL LOW (ref 22–32)
Calcium: 7.6 mg/dL — ABNORMAL LOW (ref 8.9–10.3)
Chloride: 109 mmol/L (ref 98–111)
Creatinine, Ser: 0.42 mg/dL — ABNORMAL LOW (ref 0.44–1.00)
GFR, Estimated: >60 mL/min (ref >60)
Glucose, Bld: 121 mg/dL — ABNORMAL HIGH (ref 70–99)
Potassium: 3.9 mmol/L (ref 3.5–5.1)
Sodium: 133 mmol/L — ABNORMAL LOW (ref 135–145)
Total Bilirubin: 1.3 mg/dL — ABNORMAL HIGH (ref 0.3–1.2)
Total Protein: 5.7 g/dL — ABNORMAL LOW (ref 6.5–8.1)

## 2022-04-30 LAB — BPAM RBC
ISSUE DATE / TIME: 202404061231
Unit Type and Rh: 7300

## 2022-04-30 LAB — HEMOGLOBIN AND HEMATOCRIT, BLOOD
HCT: 39.4 % (ref 36.0–46.0)
Hemoglobin: 13.6 g/dL (ref 12.0–15.0)

## 2022-04-30 LAB — HEPATITIS B SURFACE ANTIBODY,QUALITATIVE: Hep B S Ab: REACTIVE — AB

## 2022-04-30 LAB — PREPARE RBC (CROSSMATCH)

## 2022-04-30 LAB — HEPATITIS B CORE ANTIBODY, TOTAL: Hep B Core Total Ab: NONREACTIVE

## 2022-04-30 LAB — HEPARIN LEVEL (UNFRACTIONATED)
Heparin Unfractionated: 0.44 [IU]/mL (ref 0.30–0.70)
Heparin Unfractionated: 0.47 IU/mL (ref 0.30–0.70)

## 2022-04-30 MED ORDER — FUROSEMIDE 10 MG/ML IJ SOLN
20.0000 mg | Freq: Once | INTRAMUSCULAR | Status: AC
  Administered 2022-04-30: 20 mg via INTRAVENOUS
  Filled 2022-04-30: qty 2

## 2022-04-30 MED ORDER — SODIUM CHLORIDE 0.9% IV SOLUTION: Freq: Once | INTRAVENOUS | Status: AC

## 2022-04-30 MED ORDER — DEXAMETHASONE SODIUM PHOSPHATE 4 MG/ML IJ SOLN
20.0000 mg | INTRAMUSCULAR | Status: DC
  Administered 2022-04-30 – 2022-05-01 (×2): 20 mg via INTRAVENOUS
  Filled 2022-04-30 (×5): qty 5

## 2022-04-30 NOTE — Progress Notes (Signed)
I appreciate everybody's help with [name].  This is truly complicated.  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  Her cytology on spinal fluid came back positive.  I just wonder if we might be able to send the cells off for HER2 studies.  I know that ENT saw her for the epistaxis.  They put packing in.  There is still little bit of oozing from the right nares.  Unfortunately, I really think she is going to need anticoagulation.  She has embolic infarcts in the brain.  I had to believe this is all secondary to her underlying malignancy.  I know that she did have the echocardiogram.  I do not think this really showed anything that was obviously valvular.  Regardless, I still believe that she is at risk for marantic endocarditis.  As far as treating the carcinomatous meningitis, this is going to be tricky.  I know that since she is BRCA positive, we might be able to utilize olaparib.  I think that there are some reports of olaparib helping with CNS involvement by malignancy.  I know this is somewhat controversial.  I know there is not a lot of information about this.  We can also consider intrathecal therapy.  For right now, I would put her on a little steroid sheet.  She has little bit of a headache.  Hopefully the steroid will help a little bit.  I do think she is going to be transfused.  Yesterday her hemoglobin was 8.  I cannot imagine that the hemoglobin significantly better.  I think a transfusion might also help the epistaxis.  Her LFTs are still quite elevated.  SGPT is 444 and SGOT 271.  Alkaline phosphatase is 1000 and.  BUN is 5 creatinine 0.42.  I talked her about the transfusion.  I do think it will help her out.  She agrees.  I will have to speak to her oncologist at [facility].  I know that she has seen Dr. Wilford Grist at [facility].  Unfortunately, she was not able to qualify for a clinical trial.  Her vital signs are temperature of 98.1.  Pulse 104.  Blood pressure  121/78.  Her head and neck exam shows a packing in the left nares.  There is little bit of oozing from the right nares.  There is no adenopathy in the neck.  Lungs are clear bilaterally.  Cardiac exam regular rate and rhythm.  Abdomen is soft.  Bowel sounds are present.  Neurological exam is nonfocal.  Extremity shows no clubbing, cyanosis or edema.   Again, this is quite complex.  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  I think she has responded to systemic chemotherapy.  The LFT elevation could only be from her chemotherapy.  We have not found any evidence of disease in her liver by malignancy.  Olaparib might be a treatment option for [place].  This does have some CNS penetration.  This also can help her systemically since she is BRCA positive.  She did not have to be on anticoagulation.  I suspect that were going to have to think about doing Lovenox with her as an outpatient.  However, I would keep her on heparin while inpatient.  There is certainly a lot going on.  I know the staff on [address] are doing a fantastic job with her.   Christin Bach, MD  [facility] [phone]

## 2022-04-30 NOTE — Progress Notes (Signed)
Subjective: She feels her headaches are slightly better, still some oozing but no extensive bleeding from her nose.  She is being transfused.  Exam: Vitals:   04/30/22 1258 04/30/22 1554  BP: (!) 129/90 119/83  Pulse: (!) 111 100  Resp: 17 15  Temp: (!) 97.5 F (36.4 C) 97.6 F (36.4 C)  SpO2: 99% 99%   Gen: In bed, NAD Resp: non-labored breathing, no acute distress Abd: soft, nt  Neuro: MS: Awake, alert, interactive and appropriate, oriented  Pertinent Labs: LFTs continue to be elevated  Impression: 37 year old female with multifocal acute ischemic strokes of different ages likely secondary to hypercoagulable state due to malignancy.  Given her clinical picture, I think anticoagulation is indicated, and therefore doubt that a transesophageal echocardiogram would be likely to change her management.  Given the fact that the infarcts have different ages, she has accumulated multiple strokes over the course of only a few weeks and therefore I think she is at high risk of continued embolic infarcts.  This is counterbalanced by the fact that she has continued bleeding from her nose, though relatively slow at this point.  If we are able to get her nosebleeds under control, then I would favor continued anticoagulation, but if she is bleeding enough to require frequent transfusions, then I am not certain that this would be possible to continue.  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  Typically without treatment, leptomeningeal spread of solid tumors is associated with a fairly poor prognosis, on the order of a few months.    She also has significant elevations in her liver enzymes, though her INR remains relatively normal.  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  At this point, she has multiple competing problems and I do worry that with her poor prognosis associated with her  leptomeningeal spread, we may be getting to a point where we need to start thinking about limiting care if she were to become in extremis from cardiac arrest.  I broached this with the patient and her husband today, for now she is full code, but she is open to talking through things with palliative care.  Recommendations: 1) defer management of leptomeningeal carcinomatosis to oncology 2) recommend palliative care consultation to begin discussions prior to her being in extremis 3) anticoagulation if we are able to keep her nosebleeding to a minimum. 4) neurology will follow  Ritta Slot, MD [facility] [phone]  If 7pm- 7am, please page neurology on call as listed in AMION.

## 2022-04-30 NOTE — Progress Notes (Signed)
ANTICOAGULATION CONSULT NOTE  Pharmacy Consult for heparin infusion Indication:  embolic stroke and a hypercoagulable state 2/2 CA  Allergies  Allergen Reactions   Other Other (See Comments)    History of Crohn's disease- NO seeds, tomatoes, anything that doesn't digest well, etc.....    Patient Measurements: Height: 5\' 3"  (160 cm) Weight: 51.7 kg (114 lb) IBW/kg (Calculated) : 52.4 Heparin Dosing Weight: 51.7 kg  Vital Signs: Temp: 98.1 F (36.7 C) (04/05 2338) Temp Source: Oral (04/05 2338) BP: 121/78 (04/05 2338) Pulse Rate: 104 (04/05 2338)  Labs: Recent Labs    05/09/2022 1031 05/10/2022 1826 05/19/2022 2140 04/29/22 0624 04/29/22 1206 04/29/22 2350  HGB 7.2* 9.2* 8.3* 8.0*  --   --   HCT 22.5* 26.5* 26.1* 24.9*  --   --   PLT 164  --  141* 138*  --   --   APTT  --   --   --   --  35  --   LABPROT  --   --   --   --  15.9*  --   INR  --   --   --   --  1.3*  --   HEPARINUNFRC  --   --   --   --   --  0.44  CREATININE 0.39*  --   --  0.36*  --   --      Estimated Creatinine Clearance: 80.9 mL/min (A) (by C-G formula based on SCr of 0.36 mg/dL (L)).   Medical History: Past Medical History:  Diagnosis Date   Anal fissure    Anemia 11/2011   iron deficiency. 01/2013 parenteral iron. Intranasal B12.  Dr Myna Hidalgo follows.    B12 deficiency 11/2012   Breast cancer    Breast cancer metastasized to multiple sites, left 02/02/2022   Breast mass in female 04/21/2012   Cervical cancer screening 01/27/2012   Chicken pox as a child   Crohn's disease 2013   ileitis 10/2011. ileitis, mesenteric abscess, ? entero-enteric fistula on 08/2012 CT enterography   Dermatitis 08/22/2016   GERD (gastroesophageal reflux disease)    Hepatitis B immune 07/2012   previous vaccination.    Hives 09/2014   per Dermatologist.  Rx Zyrtec, Benadryl.    Ileitis    Serrated polyp of colon    Small bowel obstruction    SVD (spontaneous vaginal delivery) 08/13/2018   Vitamin D deficiency  2014    Medications:  Medications Prior to Admission  Medication Sig Dispense Refill Last Dose   cefdinir (OMNICEF) 300 MG capsule Take 2 capsules (600 mg total) by mouth daily. 14 capsule 0 unknown   docusate sodium (COLACE) 100 MG capsule Take 1 capsule (100 mg total) by mouth 2 (two) times daily. (Patient taking differently: Take 100 mg by mouth daily as needed for mild constipation.) 10 capsule 0 Past Week   hydrocortisone (ANUSOL-HC) 2.5 % rectal cream Place 1 Application rectally 2 (two) times daily. 28 g 1 Past Week   lidocaine-prilocaine (EMLA) cream Apply 1 Application topically See admin instructions. Apply a dime-size of cream to port-a-cath 1-2 hours prior to access. Cover with [employer].   05/16/2022   LORazepam (ATIVAN) 0.5 MG tablet Take 1 tablet (0.5 mg total) by mouth every 6 (six) hours as needed for anxiety. 30 tablet 0 04/24/2022   melatonin 3 MG TABS tablet Take 3 mg by mouth at bedtime as needed (For sleep).   Past Week   ondansetron (ZOFRAN) 8 MG tablet  Take 1 tablet (8 mg total) by mouth every 8 (eight) hours as needed for nausea or vomiting. 30 tablet 1 Past Month   pantoprazole (PROTONIX) 40 MG tablet Take 1 tablet (40 mg total) by mouth 2 (two) times daily. (Patient taking differently: Take 40 mg by mouth daily as needed (For acid reflux).) 60 tablet 1 Past Week   polyethylene glycol (MIRALAX / GLYCOLAX) 17 g packet Take 17 g by mouth daily as needed for mild constipation. 14 each 0 Past Week   prochlorperazine (COMPAZINE) 10 MG tablet Take 1 tablet (10 mg total) by mouth every 6 (six) hours as needed for nausea or vomiting. 30 tablet 1 05/08/2022   pyridoxine (NEURO-K-250 VITAMIN B6) 250 MG tablet Take 2 tablets (500 mg total) by mouth daily. 60 tablet 6 Past Week   traMADol (ULTRAM) 50 MG tablet Take 50 mg by mouth every 6 (six) hours as needed (for pain).   unknown   vitamin B-12 (CYANOCOBALAMIN) 100 MCG tablet Take 100 mcg by mouth daily.   Past Week   fluconazole  (DIFLUCAN) 100 MG tablet Take 1 tablet (100 mg total) by mouth daily. (Patient not taking: Reported on 04/29/2022) 30 tablet 1 Not Taking   methylPREDNISolone (MEDROL DOSEPAK) 4 MG TBPK tablet Take as directed (Patient not taking: Reported on 05/24/2022) 1 each 0 Not Taking   predniSONE (DELTASONE) 10 MG tablet Take 8 tablets (80 mg total) by mouth daily with breakfast. (Patient not taking: Reported on 04/29/2022) 120 tablet 1 Completed Course   vancomycin (VANCOCIN) 125 MG capsule Take 1 capsule (125 mg total) by mouth 4 (four) times daily. (Patient not taking: Reported on 05/20/2022) 40 capsule 0 Completed Course   Scheduled:   Chlorhexidine Gluconate Cloth  6 each Topical Daily   pantoprazole  40 mg Oral BID   saccharomyces boulardii  250 mg Oral BID   sodium chloride flush  10-40 mL Intracatheter Q12H   vancomycin  125 mg Oral TID AC & HS    Assessment: 36 YOF admitted to [facility] w/ a cc of emesis and dizziness. She has a PMH positive for hypercoagulable state 2/2 breast CA. MRI of the brain shows multiple infarcts of various ages thought to be embolic in nature. There is no history noted for DOAC usage. She has had frequent nose bleeds and was seen by ENT. She has been cleared for heparin infusion by this service. A consult for a heparin infusion is placed.   Goal of Therapy:  Heparin level 0.3-0.5 units/ml Monitor platelets by anticoagulation protocol: Yes  4/6 AM update Heparin level therapeutic at 0.44  Plan:  Continue heparin infusion at 900units/hr Recheck heparin level with AM labs Monitor for signs/symptoms of bleeding  Fredirick LatheIsaac Lavone Weisel, Student Pharmacist

## 2022-04-30 NOTE — Progress Notes (Signed)
PROGRESS NOTE    Carla Clark  YNW:[id number] DOB: Feb 07, 1985 DOA: 05/15/2022 PCP: Bradd Canary, MD    Chief Complaint  Patient presents with   Emesis   Dizziness    Brief Narrative:   Carla Clark is a 37 y.o. female with medical history significant of metastatic breast cancer currently undergoing chemotherapy, Crohn's disease with ileitis s/p left hemicolectomy, SBO, and gerd who presented for headache and emesis.  She had an ED visit at [facility] long couple days ago, where they performed a CT scan of the head which had noted a small focus of cortical hypodensity with effacement of the gray-white matter differentiation involving the right perirolandic frontoparietal cortex at the vertex that was thought to be nonspecific but could be a small cortical infarct.  MRI was not available then, as well patient with recent diagnosis of C. difficile diarrhea, she just finished her oral vancomycin on Tuesday, he is currently on chemotherapy, as her Rande Lawman was recently stopped due to elevated LFTs.  Her workup in ED  MRI of the brain significant for 1.4 focus of enhancement within or along the right postcentral sulcus with adjacent cortical edema, 1.3 focus of enhancement along the medial left parietal lobe with adjacent cortical edema, multiple infarcts noted within the bilateral frontal parietal lobes of various ages thought to be embolic in nature, and prominent enhancement bilateral 7th and 8th cranial nerves within the auditory canals concern for leptomeningeal metastatic disease.  Patient was not a candidate for tPA due to being outside the window.  Patient went for an LP, significant for malignancy.  .   Assessment & Plan:   Principal Problem:   Stroke Active Problems:   Anemia   Epistaxis   Breast cancer metastasized to multiple sites, left   Hyponatremia   Elevated liver enzymes   History of infection of intestine due to Clostridioides difficile   Gastroesophageal reflux disease  without esophagitis   Leptomeningeal disease   Hematemesis with nausea   Elevated LFTs  Multiple CVAs Leptomeningeal spread -MRI brain significant for multiple acute/subacute CVAs . -No with CVA workup, CTA head and neck pending, 2D echo is pending as well, continue to monitor on telemetry . -IV antibiotics has been discontinued given low concern for meningitis now her CSF findings more concerning for meningeal spread. -Unfortunately her CSF significant for malignant cells which is indicative of meningeal carcinomatosis -Neurology and oncology input greatly appreciated, CVA felt to be secondary to her malignancy and hypercoagulable state/embolic source, is currently on full anticoagulation with heparin(no bolus, lower dose due to risk for epistaxis), it is unfortunate situation here this patient who will need full anticoagulation with high risk of recurrent epistaxis, risks and benefits were discussed with the patient and family, and they elected to proceed with full anticoagulation.Marland Kitchen  Epistaxis -ENT input and assistance in management of her epistaxis greatly appreciated, status post transition, and left nares packing. -Continue to medicate wrist with bacitracin and the right side of her nose, and humidified face tent.  Normocytic anemia -Due to malignancy and chemotherapy, to receive 2 units RBC today per oncology   Metastatic breast cancer with concern of leptomeningeal spread. -Management per GI Dr. Myna Hidalgo   Hyponatremia Resolved with IV fluids    Elevated liver enzymes Transaminitis  -Thought to be secondary to [facility], trending up, GI consulted.  Recent history of C. Difficile Patient reported completing her course of oral vancomycin recently.  Continue for another 3 days during hospital stay due to IV  antibiotics on admission. -Add-on probiotics   GERD -Continue Protonix    DVT prophylaxis: Heparin Drip Code Status: (Full) Family Communication: Cussed with husband at  bedside today Disposition:   Status is: Inpatient    Consultants:  Neurology Oncology ENT   Subjective:  She reports some small amount of right nares epistaxis overnight.  Objective: Vitals:   04/29/22 1600 04/29/22 1957 04/29/22 2338 04/30/22 0800  BP: 119/87 120/83 121/78 111/74  Pulse: 92 98 (!) 104 78  Resp: 16 15 17 10   Temp: 98 F (36.7 C) 98.7 F (37.1 C) 98.1 F (36.7 C) 97.6 F (36.4 C)  TempSrc: Oral Axillary Oral Oral  SpO2: 100% 100% 99% 100%  Weight:      Height:        Intake/Output Summary (Last 24 hours) at 04/30/2022 0927 Last data filed at 04/29/2022 2029 Gross per 24 hour  Intake 546.27 ml  Output 1100 ml  Net -553.73 ml   Filed Weights   05/12/2022 1027  Weight: 51.7 kg    Examination:  Awake Alert, Oriented X 3, frail,  ill-appearing, with alopecia,  Symmetrical Chest wall movement, Good air movement bilaterally, CTAB RRR,No Gallops,Rubs or new Murmurs, No Parasternal Heave +ve B.Sounds, Abd Soft, No tenderness, No rebound - guarding or rigidity. No Cyanosis, Clubbing or edema, No new Rash or bruise        Data Reviewed: I have personally reviewed following labs and imaging studies  CBC: Recent Labs  Lab 04/25/22 1030 04/25/22 1030 04/26/22 2042 05/12/2022 1031 05/14/2022 1826 04/29/2022 2140 04/29/22 0624 04/30/22 0830  WBC 6.2  --  7.3 6.8  --  4.9 3.8* 2.7*  NEUTROABS 2.1  --  5.2 5.6  --   --  3.2 PENDING  HGB 9.0*   < > 8.4* 7.2* 9.2* 8.3* 8.0* 8.3*  HCT 27.8*  --  26.7* 22.5* 26.5* 26.1* 24.9* 25.0*  MCV 91.4  --  92.7 91.5  --  92.2 92.2 89.9  PLT 186  --  186 164  --  141* 138* 146*   < > = values in this interval not displayed.    Basic Metabolic Panel: Recent Labs  Lab 04/25/22 1030 04/26/22 2042 05/21/2022 1031 04/29/22 0624 04/30/22 0220  NA 137 131* 130* 133* 133*  K 3.6 3.7 3.8 3.3* 3.9  CL 111 107 106 109 109  CO2 18* 19* 17* 16* 17*  GLUCOSE 144* 111* 118* 97 121*  BUN 5* 10 6 6  5*  CREATININE 0.38*  0.39* 0.39* 0.36* 0.42*  CALCIUM 7.9* 7.8* 7.8* 6.8* 7.6*  MG  --   --  2.0  --   --     GFR: Estimated Creatinine Clearance: 80.9 mL/min (A) (by C-G formula based on SCr of 0.42 mg/dL (L)).  Liver Function Tests: Recent Labs  Lab 04/25/22 1030 04/26/22 2042 05/24/2022 1031 04/29/22 0624 04/30/22 0220  AST 115* 227* 252* 312* 271*  ALT 206* 274* 355* 402* 444*  ALKPHOS 804* 801* 984* 887* 1,002*  BILITOT 0.8 1.4* 1.6* 1.5* 1.3*  PROT 5.5* 5.8* 6.0* 5.2* 5.7*  ALBUMIN 3.3* 3.0* 3.0* 2.4* 2.7*    CBG: No results for input(s): "GLUCAP" in the last 168 hours.   Recent Results (from the past 240 hour(s))  CSF culture w Gram Stain     Status: None (Preliminary result)   Collection Time: 05/05/2022  2:22 PM   Specimen: CSF; Cerebrospinal Fluid  Result Value Ref Range Status   Specimen Description CSF  Final   Special Requests Immunocompromised  Final   Gram Stain   Final    WBC PRESENT,BOTH PMN AND MONONUCLEAR NO ORGANISMS SEEN CYTOSPIN SMEAR    Culture   Final    NO GROWTH < 24 HOURS Performed at [facility], [address] [place], [place] [address]    Report Status PENDING  Incomplete  Culture, blood (single) w Reflex to ID Panel     Status: None (Preliminary result)   Collection Time: 05/18/2022  6:23 PM   Specimen: BLOOD  Result Value Ref Range Status   Specimen Description BLOOD SITE NOT SPECIFIED  Final   Special Requests   Final    BOTTLES DRAWN AEROBIC AND ANAEROBIC Blood Culture adequate volume   Culture   Final    NO GROWTH < 24 HOURS Performed at [facility], [address], [place], [place] [address]    Report Status PENDING  Incomplete         Radiology Studies: CT ANGIO HEAD NECK W WO CM  Result Date: 04/29/2022 CLINICAL DATA:  Stroke/TIA.  The term in embolic source. EXAM: CT ANGIOGRAPHY HEAD AND NECK TECHNIQUE: Multidetector CT imaging of the head and neck was performed using the standard protocol during bolus administration of  intravenous contrast. Multiplanar CT image reconstructions and MIPs were obtained to evaluate the vascular anatomy. Carotid stenosis measurements (when applicable) are obtained utilizing NASCET criteria, using the distal internal carotid diameter as the denominator. RADIATION DOSE REDUCTION: This exam was performed according to the departmental dose-optimization program which includes automated exposure control, adjustment of the mA and/or kV according to patient size and/or use of iterative reconstruction technique. CONTRAST:  7mL OMNIPAQUE IOHEXOL 350 MG/ML SOLN COMPARISON:  CT and MRI of the brain April 28, 2022. FINDINGS: CT HEAD FINDINGS Brain: No acute territorial infarct. Known small scattered subacute infarcts are better appreciated on prior. No evidence of hemorrhage, hydrocephalus, extra-axial collection or mass lesion/mass effect. Vascular: No hyperdense vessel or unexpected calcification. Skull: Negative for fracture. Sinuses/Orbits: No acute finding. Other: None. Review of the MIP images confirms the above findings CTA NECK FINDINGS Aortic arch: Standard branching. Imaged portion shows no evidence of aneurysm or dissection. No significant stenosis of the major arch vessel origins. Right carotid system: No evidence of dissection, stenosis (50% or greater), or occlusion. Left carotid system: No evidence of dissection, stenosis (50% or greater), or occlusion. Vertebral arteries: Codominant. No evidence of dissection, stenosis (50% or greater), or occlusion. Skeleton: Diffuse multifocal lytic lesions within the visualized spine consistent with known metastatic osseous disease. Other neck: Negative. Upper chest: Negative. Review of the MIP images confirms the above findings CTA HEAD FINDINGS Anterior circulation: No significant stenosis, proximal occlusion, aneurysm, or vascular malformation. Posterior circulation: No significant stenosis, proximal occlusion, aneurysm, or vascular malformation. Venous  sinuses: As permitted by contrast timing, patent. Anatomic variants: None significant. Review of the MIP images confirms the above findings IMPRESSION: 1. No acute intracranial abnormality. Known small scattered subacute infarcts are better appreciated on prior MRI. 2. No evidence of dissection, hemodynamically significant stenosis or occlusion of the major neck arteries. 3. No significant stenosis, proximal occlusion, aneurysm, or vascular malformation of the intracranial circulation. 4. Diffuse multifocal lytic lesions within the visualized spine consistent with known metastatic osseous disease. Electronically Signed   By: Baldemar Lenis M.D.   On: 04/29/2022 16:49   VAS [place] LOWER EXTREMITY VENOUS (DVT)  Result Date: 04/29/2022  Lower Venous DVT Study Patient Name:  Carla Clark  Date of Exam:   04/29/2022 Medical Rec #: [id number]       Accession #:    [id number] Date of Birth: [birth year]        Patient Gender: F Patient Age:   64 years Exam Location:  [facility] Procedure:      VAS [place] LOWER EXTREMITY VENOUS (DVT) Referring Phys: Hajer Dwyer --------------------------------------------------------------------------------  Indications: Swelling, SOB, and stroke.  Risk Factors: Cancer breast. Comparison Study: No prior study. Performing Technologist: McKayla Maag RVT, [place]  Examination Guidelines: A complete evaluation includes B-mode imaging, spectral Doppler, color Doppler, and power Doppler as needed of all accessible portions of each vessel. Bilateral testing is considered an integral part of a complete examination. Limited examinations for reoccurring indications may be performed as noted. The reflux portion of the exam is performed with the patient in reverse Trendelenburg.  +---------+---------------+---------+-----------+----------+--------------+ RIGHT    CompressibilityPhasicitySpontaneityPropertiesThrombus Aging  +---------+---------------+---------+-----------+----------+--------------+ CFV      Full           Yes      Yes                                 +---------+---------------+---------+-----------+----------+--------------+ SFJ      Full                                                        +---------+---------------+---------+-----------+----------+--------------+ FV Prox  Full                                                        +---------+---------------+---------+-----------+----------+--------------+ FV Mid   Full                                                        +---------+---------------+---------+-----------+----------+--------------+ FV DistalFull                                                        +---------+---------------+---------+-----------+----------+--------------+ PFV      Full                                                        +---------+---------------+---------+-----------+----------+--------------+ POP      Full           Yes      Yes                                 +---------+---------------+---------+-----------+----------+--------------+ PTV      Full                                                        +---------+---------------+---------+-----------+----------+--------------+  PERO     Full                                                        +---------+---------------+---------+-----------+----------+--------------+   +---------+---------------+---------+-----------+----------+--------------+ LEFT     CompressibilityPhasicitySpontaneityPropertiesThrombus Aging +---------+---------------+---------+-----------+----------+--------------+ CFV      Full           Yes      Yes                                 +---------+---------------+---------+-----------+----------+--------------+ SFJ      Full                                                         +---------+---------------+---------+-----------+----------+--------------+ FV Prox  Full                                                        +---------+---------------+---------+-----------+----------+--------------+ FV Mid   Full                                                        +---------+---------------+---------+-----------+----------+--------------+ FV DistalFull                                                        +---------+---------------+---------+-----------+----------+--------------+ PFV      Full                                                        +---------+---------------+---------+-----------+----------+--------------+ POP      Full           Yes      Yes                                 +---------+---------------+---------+-----------+----------+--------------+ PTV      Full                                                        +---------+---------------+---------+-----------+----------+--------------+ PERO     Full                                                        +---------+---------------+---------+-----------+----------+--------------+  Summary: BILATERAL: - No evidence of deep vein thrombosis seen in the lower extremities, bilaterally. - No evidence of superficial venous thrombosis in the lower extremities, bilaterally. -No evidence of popliteal cyst, bilaterally.   *See table(s) above for measurements and observations. Electronically signed by Waverly Ferrari MD on 04/29/2022 at 4:06:08 PM.    Final    ECHOCARDIOGRAM COMPLETE  Result Date: 04/29/2022    ECHOCARDIOGRAM REPORT   Patient Name:   Carla Clark Date of Exam: 04/29/2022 Medical Rec #:  [id number]      Height:       63.0 in Accession #:    [id number]     Weight:       114.0 lb Date of Birth:  03-Apr-1985       BSA:          1.523 m Patient Age:    36 years       BP:           122/75 mmHg Patient Gender: F              HR:           85 bpm. Exam Location:   Inpatient Procedure: 2D Echo, Cardiac Doppler, Color Doppler, Strain Analysis and 3D Echo Indications:    stroke  History:        Patient has no prior history of Echocardiogram examinations.  Sonographer:    Mike Gip Referring Phys: [phone] DENISE A WOLFE IMPRESSIONS  1. Left ventricular ejection fraction, by estimation, is 65 to 70%. Left ventricular ejection fraction by 3D volume is 65 %. The left ventricle has normal function. The left ventricle has no regional wall motion abnormalities. Left ventricular diastolic  parameters were normal. The average left ventricular global longitudinal strain is -20.6 %. The global longitudinal strain is normal.  2. Right ventricular systolic function is normal. The right ventricular size is normal. There is normal pulmonary artery systolic pressure. The estimated right ventricular systolic pressure is 33.6 mmHg.  3. The mitral valve is normal in structure. Mild mitral valve regurgitation. No evidence of mitral stenosis.  4. The aortic valve is normal in structure. Aortic valve regurgitation is not visualized. No aortic stenosis is present.  5. The inferior vena cava is dilated in size with >50% respiratory variability, suggesting right atrial pressure of 8 mmHg. FINDINGS  Left Ventricle: Left ventricular ejection fraction, by estimation, is 65 to 70%. Left ventricular ejection fraction by 3D volume is 65 %. The left ventricle has normal function. The left ventricle has no regional wall motion abnormalities. The average left ventricular global longitudinal strain is -20.6 %. The global longitudinal strain is normal. The left ventricular internal cavity size was normal in size. There is no left ventricular hypertrophy. Left ventricular diastolic parameters were normal. Normal left ventricular filling pressure. Right Ventricle: The right ventricular size is normal. No increase in right ventricular wall thickness. Right ventricular systolic function is normal. There is normal  pulmonary artery systolic pressure. The tricuspid regurgitant velocity is 2.53 m/s, and  with an assumed right atrial pressure of 8 mmHg, the estimated right ventricular systolic pressure is 33.6 mmHg. Left Atrium: Left atrial size was normal in size. Right Atrium: Right atrial size was normal in size. Pericardium: There is no evidence of pericardial effusion. Mitral Valve: The mitral valve is normal in structure. Mild mitral valve regurgitation. No evidence of mitral valve stenosis. Tricuspid Valve: The tricuspid valve is normal in structure. Tricuspid valve regurgitation is trivial. No  evidence of tricuspid stenosis. Aortic Valve: The aortic valve is normal in structure. Aortic valve regurgitation is not visualized. No aortic stenosis is present. Pulmonic Valve: The pulmonic valve was normal in structure. Pulmonic valve regurgitation is trivial. No evidence of pulmonic stenosis. Aorta: The aortic root is normal in size and structure. Venous: The inferior vena cava is dilated in size with greater than 50% respiratory variability, suggesting right atrial pressure of 8 mmHg. IAS/Shunts: No atrial level shunt detected by color flow Doppler.  LEFT VENTRICLE PLAX 2D LVIDd:         4.80 cm         Diastology LVIDs:         2.70 cm         LV e' medial:    12.50 cm/s LV PW:         0.80 cm         LV E/e' medial:  9.8 LV IVS:        0.80 cm         LV e' lateral:   16.00 cm/s                                LV E/e' lateral: 7.6  LV Volumes (MOD)               2D LV vol d, MOD    130.0 ml      Longitudinal A2C:                           Strain LV vol d, MOD    117.0 ml      2D Strain GLS  -22.6 % A4C:                           (A2C): LV vol s, MOD    37.4 ml       2D Strain GLS  -18.7 % A2C:                           (A3C): LV vol s, MOD    34.1 ml       2D Strain GLS  -25.0 % A4C:                           (A4C): LV SV MOD A2C:   92.6 ml       2D Strain GLS  -20.6 % LV SV MOD A4C:   117.0 ml      Avg: LV SV MOD BP:    87.3  ml                                3D Volume EF                                LV 3D EF:    Left                                             ventricul  ar                                             ejection                                             fraction                                             by 3D                                             volume is                                             65 %.                                 3D Volume EF:                                3D EF:        65 %                                LV EDV:       140 ml                                LV ESV:       50 ml                                LV SV:        91 ml RIGHT VENTRICLE             IVC RV Basal diam:  3.40 cm     IVC diam: 2.40 cm RV S prime:     18.60 cm/s TAPSE (M-mode): 2.4 cm LEFT ATRIUM             Index        RIGHT ATRIUM          Index LA diam:        3.30 cm 2.17 cm/m   RA Area:     9.87 cm LA Vol (A2C):   48.9 ml 32.11 ml/m  RA Volume:   16.90 ml 11.10 ml/m LA Vol (A4C):   38.1 ml 25.02 ml/m LA Biplane Vol: 44.0 ml 28.90 ml/m  AORTIC VALVE LVOT Vmax:   116.00 cm/s LVOT Vmean:  83.400 cm/s LVOT VTI:    0.266 m  AORTA Ao Asc diam: 2.50 cm MITRAL VALVE  TRICUSPID VALVE MV Area (PHT): 3.13 cm       TR Peak grad:   25.6 mmHg MV Decel Time: 242 msec       TR Vmax:        253.00 cm/s MR Peak grad:    120.6 mmHg MR Mean grad:    81.0 mmHg    SHUNTS MR Vmax:         549.00 cm/s  Systemic VTI: 0.27 m MR Vmean:        428.0 cm/s MR PISA:         0.57 cm MR PISA Eff ROA: 4 mm MR PISA Radius:  0.30 cm MV E velocity: 122.00 cm/s MV A velocity: 95.50 cm/s MV E/A ratio:  1.28 Armanda Magic MD Electronically signed by Armanda Magic MD Signature Date/Time: 04/29/2022/11:12:54 AM    Final    DG FL GUIDED LUMBAR PUNCTURE  Result Date: 04/27/2022 CLINICAL DATA:  Metastatic breast cancer. Concern for leptomeningeal disease. EXAM: LUMBAR PUNCTURE UNDER  FLUOROSCOPY PROCEDURE: An appropriate skin entry site was determined fluoroscopically. Operator donned sterile gloves and mask. Skin site was marked, then prepped with Betadine, draped in usual sterile fashion, and infiltrated locally with 1% lidocaine. A 20 gauge spinal needle advanced into the thecal sac at L4-L5 from a right interlaminar approach. Clear colorless CSF spontaneously returned, with opening pressure of 33 cm water. 15 ml CSF were collected and divided among 4 sterile vials for the requested laboratory studies. The needle was then removed. The patient tolerated the procedure well and there were no complications. FLUOROSCOPY: Radiation Exposure Index (as provided by the fluoroscopic device): 0.7 mGy Kerma IMPRESSION: Technically successful lumbar puncture under fluoroscopy. This exam was performed by Loyce Dys, and was supervised and interpreted by Malachi Pro MD. Electronically Signed   By: Obie Dredge M.D.   On: 05/15/2022 15:10   MR Brain W and Wo Contrast  Result Date: 04/27/2022 CLINICAL DATA:  Provided history: Headache, increasing frequency or severity. Additional history obtained from electronic MEDICAL RECORD NUMBERStage IV breast cancer. EXAM: MRI HEAD WITHOUT AND WITH CONTRAST TECHNIQUE: Multiplanar, multiecho pulse sequences of the brain and surrounding structures were obtained without and with intravenous contrast. CONTRAST:  21mL GADAVIST GADOBUTROL 1 MMOL/ML IV SOLN COMPARISON:  Head CT 04/26/2022.  Brain MRI 03/07/2022. FINDINGS: Brain: No age advanced or lobar predominant parenchymal atrophy. 1.4 cm focus of enhancement within or along the right postcentral sulcus with adjacent cortical edema (for instance as seen on series 10, images 41-45) (series 12, image 12) (series 6, image 29). 1.3 cm focus of enhancement within or along the inferomedial left parietal lobe with adjacent cortical edema (for instance as seen on series 10, image 34) (series 12, image 10). Foci of  diffusion-weighted and T2 FLAIR hyperintense signal abnormality within the bilateral frontoparietal white matter measuring up to 2.3 cm. There is T2 shine through at the majority of these sites. However, there is a 7 mm focus within the anterior right frontal lobe white matter which demonstrates true restricted diffusion (series 2, image 33) (series 250, image 33), and a 6 mm focus within the posterior left frontal lobe white matter which demonstrates true restricted diffusion. Additionally, there is a 3 mm focus of restricted diffusion and T2 FLAIR hyperintense signal abnormality within the left occipital cortex (series 2, image 30). There is no corresponding enhancement at these sites. These are favored to reflect infarcts, with the foci in the anterior right frontal lobe white matter, posterior left frontal lobe  white matter and left occipital cortex being acute, and the remainder subacute to chronic. Mildly prominent enhancement along the bilateral seventh and eighth cranial nerves within the internal auditory canals (series 10, images 16 and 17). Chronic microhemorrhage within the right parietal lobe (series 7, image 86). A previously demonstrated right subdural hematoma is no longer present. No extra-axial fluid collection. No midline shift. Vascular: Maintained flow voids within the proximal large arterial vessels. Skull and upper cervical spine: No suspicious marrow lesion. Sinuses/Orbits: No mass or acute finding within the imaged orbits. No significant paranasal sinus disease. IMPRESSION: 1. 1.4 cm focus of enhancement within or along the right postcentral sulcus with adjacent cortical edema. 1.3 cm focus of enhancement within or along the medial left parietal lobe with adjacent cortical edema. While these may reflect subacute infarcts, leptomeningeal metastatic disease cannot be excluded. Correlation with CSF analysis and MR imaging of the spine should be considered. Additionally, a short-interval  follow-up brain MRI (without and with contrast) is recommended in 6-8 weeks. 2. Multiple foci of signal abnormality within the bilateral frontoparietal lobes measuring up to 2.3 cm, likely reflecting infarcts of various age. Foci within the anterior right frontal lobe white matter, posterior left frontal lobe white matter and left occipital cortex are most compatible with acute infarcts. The remainder are likely subacute to chronic. Findings are suspicious for an embolic process. 3. Mildly prominent enhancement along the bilateral seventh and eighth cranial nerves within the internal auditory canals. Findings are indeterminate for leptomeningeal metastatic disease versus prominent vascular enhancement. Consider utilizing an internal auditory canal protocol at the time of brain MRI follow-up. 4. Chronic microhemorrhage within the right parietal lobe. 5. Previously demonstrated right subdural hematoma no longer present. Electronically Signed   By: Jackey Loge D.O.   On: 05/10/2022 12:43        Scheduled Meds:  sodium chloride   Intravenous Once   Chlorhexidine Gluconate Cloth  6 each Topical Daily   dexamethasone  20 mg Intravenous Q24H   furosemide  20 mg Intravenous Once   furosemide  20 mg Intravenous Once   pantoprazole  40 mg Oral BID   saccharomyces boulardii  250 mg Oral BID   sodium chloride flush  10-40 mL Intracatheter Q12H   vancomycin  125 mg Oral TID AC & HS   Continuous Infusions:  sodium chloride 50 mL/hr at 04/29/22 2336   heparin 900 Units/hr (04/29/22 1807)     LOS: 2 days      Huey Bienenstock, MD [facility]   To contact the attending provider between 7A-7P or the covering provider during after hours 7P-7A, please log into the web site [email or web address] and access using universal [employer] password for that web site. If you do not have the password, please call the hospital operator.  04/30/2022, 9:27 AM

## 2022-04-30 NOTE — Progress Notes (Addendum)
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    Patient Measurements: Height: 5\' 3"  (160 cm) Weight: 51.7 kg (114 lb) IBW/kg (Calculated) : 52.4 Heparin Dosing Weight: 51.7 kg  Vital Signs: Temp: 98 F (36.7 C) (04/06 1012) Temp Source: Axillary (04/06 1012) BP: 117/78 (04/06 1012) Pulse Rate: 86 (04/06 1012)  Labs: Recent Labs    05/16/2022 1031 05/21/2022 1826 05/06/2022 2140 04/29/22 0624 04/29/22 1206 04/29/22 2350 04/30/22 0220 04/30/22 0830  HGB 7.2*   < > 8.3* 8.0*  --   --   --  8.3*  HCT 22.5*   < > 26.1* 24.9*  --   --   --  25.0*  PLT 164  --  141* 138*  --   --   --  146*  APTT  --   --   --   --  35  --   --   --   LABPROT  --   --   --   --  15.9*  --  15.3*  --   INR  --   --   --   --  1.3*  --  1.2  --   HEPARINUNFRC  --   --   --   --   --  0.44  --  0.47  CREATININE 0.39*  --   --  0.36*  --   --  0.42*  --    < > = values in this interval not displayed.     Estimated Creatinine Clearance: 80.9 mL/min (A) (by C-G formula based on SCr of 0.42 mg/dL (L)).   Medical History: Past Medical History:  Diagnosis Date   Anal fissure    Anemia 11/2011   iron deficiency. 01/2013 parenteral iron. Intranasal B12.  Dr Myna HidalgoEnnever follows.    B12 deficiency 11/2012   Breast cancer    Breast cancer metastasized to multiple sites, left 02/02/2022   Breast mass in female 04/21/2012   Cervical cancer screening 01/27/2012   Chicken pox as a child   Crohn's disease 2013   ileitis 10/2011. ileitis, mesenteric abscess, ? entero-enteric fistula on 08/2012 CT enterography   Dermatitis 08/22/2016   GERD (gastroesophageal reflux disease)    Hepatitis B immune 07/2012   previous vaccination.    Hives 09/2014   per  Dermatologist.  Rx Zyrtec, Benadryl.    Ileitis    Serrated polyp of colon    Small bowel obstruction    SVD (spontaneous vaginal delivery) 08/13/2018   Vitamin D deficiency 2014    Medications:  Medications Prior to Admission  Medication Sig Dispense Refill Last Dose   cefdinir (OMNICEF) 300 MG capsule Take 2 capsules (600 mg total) by mouth daily. 14 capsule 0 unknown   docusate sodium (COLACE) 100 MG capsule Take 1 capsule (100 mg total) by mouth 2 (two) times daily. (Patient taking differently: Take 100 mg by mouth daily as needed for mild constipation.) 10 capsule 0 Past Week   hydrocortisone (ANUSOL-HC) 2.5 % rectal cream Place 1 Application rectally 2 (two) times daily. 28 g 1 Past Week   lidocaine-prilocaine (EMLA) cream Apply 1 Application topically See admin instructions. Apply a dime-size of cream to port-a-cath 1-2 hours prior to access. Cover with [employer].  05/11/2022   LORazepam (ATIVAN) 0.5 MG tablet Take 1 tablet (0.5 mg total) by mouth every 6 (six) hours as needed for anxiety. 30 tablet 0 04/24/2022   melatonin 3 MG TABS tablet Take 3 mg by mouth at bedtime as needed (For sleep).   Past Week   ondansetron (ZOFRAN) 8 MG tablet Take 1 tablet (8 mg total) by mouth every 8 (eight) hours as needed for nausea or vomiting. 30 tablet 1 Past Month   pantoprazole (PROTONIX) 40 MG tablet Take 1 tablet (40 mg total) by mouth 2 (two) times daily. (Patient taking differently: Take 40 mg by mouth daily as needed (For acid reflux).) 60 tablet 1 Past Week   polyethylene glycol (MIRALAX / GLYCOLAX) 17 g packet Take 17 g by mouth daily as needed for mild constipation. 14 each 0 Past Week   prochlorperazine (COMPAZINE) 10 MG tablet Take 1 tablet (10 mg total) by mouth every 6 (six) hours as needed for nausea or vomiting. 30 tablet 1 05/04/2022   pyridoxine (NEURO-K-250 VITAMIN B6) 250 MG tablet Take 2 tablets (500 mg total) by mouth daily. 60 tablet 6 Past Week   traMADol (ULTRAM) 50 MG tablet  Take 50 mg by mouth every 6 (six) hours as needed (for pain).   unknown   vitamin B-12 (CYANOCOBALAMIN) 100 MCG tablet Take 100 mcg by mouth daily.   Past Week   fluconazole (DIFLUCAN) 100 MG tablet Take 1 tablet (100 mg total) by mouth daily. (Patient not taking: Reported on 05/20/2022) 30 tablet 1 Not Taking   methylPREDNISolone (MEDROL DOSEPAK) 4 MG TBPK tablet Take as directed (Patient not taking: Reported on 05/02/2022) 1 each 0 Not Taking   predniSONE (DELTASONE) 10 MG tablet Take 8 tablets (80 mg total) by mouth daily with breakfast. (Patient not taking: Reported on 05/18/2022) 120 tablet 1 Completed Course   vancomycin (VANCOCIN) 125 MG capsule Take 1 capsule (125 mg total) by mouth 4 (four) times daily. (Patient not taking: Reported on 05/24/2022) 40 capsule 0 Completed Course   Scheduled:   Chlorhexidine Gluconate Cloth  6 each Topical Daily   dexamethasone  20 mg Intravenous Q24H   furosemide  20 mg Intravenous Once   furosemide  20 mg Intravenous Once   pantoprazole  40 mg Oral BID   saccharomyces boulardii  250 mg Oral BID   sodium chloride flush  10-40 mL Intracatheter Q12H   vancomycin  125 mg Oral TID AC & HS    Assessment: 36 YOF admitted to 5[facility] w/ a cc of emesis and dizziness. She has a PMH positive for hypercoagulable state 2/2 malignancy. MRI of the brain shows multiple infarcts of various ages thought to be embolic in nature. There is no history noted for DOAC usage. She has had frequent nose bleeds and was seen by ENT. She has been cleared for heparin infusion by this service. A consult for a heparin infusion is placed.   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 Oncology/neuro following. Per oncology, patient will continue to need [facility] for embolic infarcts to the brain despite active bleed. Heparin level 0.47 on drip rate 900 units/hr therapeutic. Hgb 8.3 and plt 146, stable. Will continue with AC, but with active bleed will aim for lower end of range. May  need to consider lovenox with close monitoring at discharge.     Goal of Therapy:  Heparin level 0.3-0.5 units/ml Monitor platelets by anticoagulation protocol: Yes   Plan:  Decrease heparin infusion to 850 units/hr  Monitor daily heparin level and CBC Continue to monitor H&H   F/u plan for outpatient [facility] closer to discharge    Jerry Caras, PharmD PGY1 Pharmacy Resident   04/30/2022 11:02 AM

## 2022-04-30 NOTE — Consult Note (Signed)
Consultation Note Date: 04/30/2022   Patient Name: Carla Clark  DOB: 08-Jan-1986  MRN: [id number]  Age / Sex: 37 y.o., female  PCP: Bradd Canary, MD Referring Physician: Starleen Arms, MD  Reason for Consultation: {Reason for Consult:23484}  HPI/Patient Profile: 37 y.o. female  with past medical history of *** admitted on 05/20/2022 with ***.    PMT has been consulted to assist with goals of care conversation.  Clinical Assessment and Goals of Care:  I have reviewed medical records including EPIC notes, labs and imaging, ***, assessed the patient and then *** with *** to discuss diagnosis prognosis, GOC, EOL wishes, disposition and options.  I introduced Palliative Medicine as specialized medical care for people living with serious illness. It focuses on providing relief from the symptoms and stress of a serious illness. The goal is to improve quality of life for both the patient and the family.  We discussed a brief life review of the patient and then focused on their current illness. ***  I attempted to elicit values and goals of care important to the patient.    Medical History Review and Understanding:  ***  Social History: ***  Functional and Nutritional State: ***  Palliative Symptoms: ***  Advance Directives: A detailed discussion regarding advanced directives was had. ***   Code Status: ***  Discussion: ***  The difference between aggressive medical intervention and comfort care was considered in light of the patient's goals of care. Hospice and Palliative Care services outpatient were explained and offered.   Discussed the importance of continued conversation with family and the medical providers regarding overall plan of care and treatment options, ensuring decisions are within the context of the patient's values and GOCs.   Questions and concerns were addressed.  Hard  Choices booklet left for review. The family was encouraged to call with questions or concerns.  PMT will continue to support holistically.   {Primary Decision FMBWG:66599}    SUMMARY OF RECOMMENDATIONS   *** Code Status/Advance Care Planning: {Palliative Code status:23503}   Symptom Management:  ***  Palliative Prophylaxis:  {Palliative Prophylaxis:21015}  Additional Recommendations (Limitations, Scope, Preferences): {Recommended Scope and Preferences:21019}  Psycho-social/Spiritual:  Desire for further Chaplaincy support:{YES NO:22349} Additional Recommendations: {PAL SOCIAL:21064}  Prognosis:  {Palliative Care Prognosis:23504}  Discharge Planning: {Palliative dispostion:23505}      Primary Diagnoses: Present on Admission:  Stroke  Anemia  Breast cancer metastasized to multiple sites, left  Hyponatremia  Elevated liver enzymes  Gastroesophageal reflux disease without esophagitis  Epistaxis  History of infection of intestine due to Clostridioides difficile    Physical Exam  Vital Signs: BP 119/83   Pulse 100   Temp 97.6 F (36.4 C) (Axillary)   Resp 15   Ht 5\' 3"  (1.6 m)   Wt 51.7 kg   LMP 02/19/2022   SpO2 99%   BMI 20.19 kg/m  Pain Scale: 0-10   Pain Score: 3    SpO2: SpO2: 99 % O2 Device:SpO2: 99 % O2 Flow Rate: .    Palliative Assessment/Data:      Total time: I spent *** minutes in the care of the patient today in the above activities and documenting the encounter.  MDM:  Medical Decision Making: #/Complex Problems:                   Data Reviewed:              Management:   Jerian Morais Jeni Salles, PA-C  Palliative Medicine Team  Team phone # [phone]  Thank you for allowing the Palliative Medicine Team to assist in the care of this patient. Please utilize secure chat with additional questions, if there is no response within 30 minutes please call the above phone number.  Palliative Medicine Team providers are available  by phone from 7am to 7pm daily and can be reached through the team cell phone.  Should this patient require assistance outside of these hours, please call the patient's attending physician.

## 2022-04-30 NOTE — Progress Notes (Signed)
Patient has started having additional bright red bleeding from the right side of her nose as expected after starting heparin.  I would transfuse as necessary and would expect some ongoing bleeding given the immediate start of the anticoagulation following her cauterization and packing.  It is also nice that you are using heparin as this can be turned off and reversed relatively quickly if needed.  I would try and avoid platelet inhibitors if possible given their longer duration of effect.  I would continue placing bacitracin aggressively in the right side of the nose.  I would leave the left pack in a bit longer given the use of heparin and likely remove it if there is no further bleeding on the left side Tuesday morning.  Lastly, she would benefit from humidified air via face tent given the temperature of the room and low humidity here in the hospital.  This will help decrease her crusting and drying of the nasal mucosa thus limiting the likelihood of a significant bleed.

## 2022-04-30 NOTE — Evaluation (Signed)
Physical Therapy Evaluation Patient Details Name: Carla Clark MRN: [id number] DOB: 08-Aug-1985 Today's Date: 04/30/2022  History of Present Illness  Carla Clark is a 37 y.o. female admitted 05/19/2022 with headache and emesis. She had an ED visit at After CT and MRI imaging Foci within the anterior right frontal lobe white matter, posterior left frontal lobe white matter and left occipital cortex are most compatible with acute infarcts. 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 PMH includes metastatic breast cancer currently undergoing chemotherapy, Crohn's disease with ileitis s/p left hemicolectomy, SBO.  Clinical Impression  Pt presents today with impaired mobility, limited by balance and activity tolerance. Pt has a very supportive family, pt independent with all mobility, reporting main difference is with feelings of instability and fatigue. Pt tolerated today's session well, ambulating in her room with mild imbalance but no overt LOB, minG provided just for safety, with pt intermittently utilizing bed rail or sink for support. HR noted to be elevated to 130s with mobility but recovers with seated rest breaks. Pt reports she has been mobilizing in her room with assist from family, reports she felt comfortable sitting in chair so ended session with pt up in chair. Acute PT will continue to follow during admission to progress mobility and activity tolerance, anticipate no PT needs at discharge but will follow and continue to assess.      Recommendations for follow up therapy are one component of a multi-disciplinary discharge planning process, led by the attending physician.  Recommendations may be updated based on patient status, additional functional criteria and insurance authorization.  Follow Up Recommendations       Assistance Recommended at Discharge Intermittent Supervision/Assistance  Patient can return home with the following  A little help with walking and/or  transfers;Help with stairs or ramp for entrance;Assist for transportation    Equipment Recommendations Other (comment) (will continue to assess needs, may benefit from rollator trial for rest if needed)  Recommendations for Other Services       Functional Status Assessment Patient has had a recent decline in their functional status and demonstrates the ability to make significant improvements in function in a reasonable and predictable amount of time.     Precautions / Restrictions Precautions Precautions: Fall Restrictions Weight Bearing Restrictions: No      Mobility  Bed Mobility               General bed mobility comments: pt seated in chair upon arrival, ended session in chair    Transfers Overall transfer level: Needs assistance Equipment used: None Transfers: Sit to/from Stand Sit to Stand: Supervision           General transfer comment: supervision for safety    Ambulation/Gait Ambulation/Gait assistance: Min guard Gait Distance (Feet): 20 Feet (8 feet to BSC, 15 feet to sink and back to chair, longest trial of 20' for last ambulation attempt) Assistive device: None, IV Pole, Rolling walker (2 wheels) Gait Pattern/deviations: Step-through pattern, Wide base of support, Drifts right/left, Decreased stride length Gait velocity: decreased     General Gait Details: mild imbalance noted with ambulation, intermittently reaching for UE support from bed rail and sink but no overt LOB. With one ambulation trial pt utilizing IV pole for balance, another trial pt with RW attempt but no changes in gait, cued for proper use as pt lifting back legs and pushing like a Actor  Modified Rankin (Stroke Patients Only) Modified Rankin (Stroke Patients Only) Pre-Morbid Rankin Score: No symptoms Modified Rankin: Moderate disability     Balance Overall balance assessment: Needs assistance Sitting-balance support: No  upper extremity supported, Feet supported Sitting balance-Leahy Scale: Good     Standing balance support: No upper extremity supported, During functional activity Standing balance-Leahy Scale: Fair Standing balance comment: mild imbalance and sway with intermittent reaching for support                             Pertinent Vitals/Pain Pain Assessment Pain Assessment: Faces Faces Pain Scale: Hurts a little bit Pain Location: generalized, been dealing with nose bleeds Pain Descriptors / Indicators: Aching Pain Intervention(s): Limited activity within patient's tolerance, Monitored during session    Home Living Family/patient expects to be discharged to:: Private residence Living Arrangements: Spouse/significant other;Children (123 and 35 year old) Available Help at Discharge: Family;Available 24 hours/day Type of Home: House Home Access: Stairs to enter Entrance Stairs-Rails: None Entrance Stairs-Number of Steps: 1 threshold Alternate Level Stairs-Number of Steps: stays on first level Home Layout: Two level;Able to live on main level with bedroom/bathroom;Full bath on main level Home Equipment: Shower seat - built in Additional Comments: husband works but has wonderful family and friend support who provide care for pt at all times    Prior Function Prior Level of Function : Independent/Modified Independent;Driving             Mobility Comments: pt independent with mobility at baseline, recently been more fatigued as pt undergoing cancer treatment. Pt able to drive but family often provides transport       Hand Dominance   Dominant Hand: Right    Extremity/Trunk Assessment   Upper Extremity Assessment Upper Extremity Assessment: Defer to OT evaluation    Lower Extremity Assessment Lower Extremity Assessment: Overall WFL for tasks assessed    Cervical / Trunk Assessment Cervical / Trunk Assessment: Normal  Communication   Communication: No difficulties   Cognition Arousal/Alertness: Awake/alert Behavior During Therapy: WFL for tasks assessed/performed Overall Cognitive Status: Within Functional Limits for tasks assessed                                 General Comments: Pt A&Ox4, husband reports no changes in cognition, pt reports she hasn't noticed anything but reports second guessing herself occasionally. Pt following commands well and consistently, pleasant throughout session        General Comments General comments (skin integrity, edema, etc.): HR elevating to 130s with ambulation in the room and standing to wash hands at the sink    Exercises     Assessment/Plan    PT Assessment Patient needs continued PT services  PT Problem List Decreased activity tolerance;Decreased balance;Decreased mobility;Decreased knowledge of use of DME;Cardiopulmonary status limiting activity       PT Treatment Interventions DME instruction;Gait training;Stair training;Functional mobility training;Therapeutic activities;Therapeutic exercise;Balance training;Neuromuscular re-education;Patient/family education    PT Goals (Current goals can be found in the Care Plan section)  Acute Rehab PT Goals Patient Stated Goal: get better PT Goal Formulation: With patient/family Time For Goal Achievement: 05/14/22 Potential to Achieve Goals: Good    Frequency Min 3X/week     Co-evaluation               AM-PAC PT "6 Clicks" Mobility  Outcome Measure Help needed turning from your back to your  side while in a flat bed without using bedrails?: A Little Help needed moving from lying on your back to sitting on the side of a flat bed without using bedrails?: A Little Help needed moving to and from a bed to a chair (including a wheelchair)?: A Little Help needed standing up from a chair using your arms (e.g., wheelchair or bedside chair)?: A Little Help needed to walk in hospital room?: A Little Help needed climbing 3-5 steps with a  railing? : A Lot 6 Click Score: 17    End of Session   Activity Tolerance: Patient tolerated treatment well Patient left: in chair;with call bell/phone within reach;with family/visitor present Nurse Communication: Mobility status PT Visit Diagnosis: Unsteadiness on feet (R26.81);Difficulty in walking, not elsewhere classified (R26.2)    Time: 8841-6606 PT Time Calculation (min) (ACUTE ONLY): 22 min   Charges:   PT Evaluation $PT Eval Moderate Complexity: 1 Mod          Lindalou Hose, PT DPT Acute Rehabilitation Services Office [phone]   SUSANN POKORSKI 04/30/2022, 3:58 PM

## 2022-04-30 NOTE — Evaluation (Signed)
Occupational Therapy Evaluation Patient Details Name: Carla Clark MRN: [id number] DOB: Jul 28, 1985 Today's Date: 04/30/2022   History of Present Illness Carla Clark is a 37 y.o. female admitted 05/16/2022 with headache and emesis. After CT and MRI imaging revealed Foci within the anterior right frontal lobe white matter, posterior left frontal lobe white matter and left occipital cortex are most compatible with acute infarcts. 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 PMH includes metastatic breast cancer currently undergoing chemotherapy, Crohn's disease with ileitis s/p left hemicolectomy, SBO.   Clinical Impression   Pt is typically independent in ADL and mobility. She presents today with the most positive attitude, but deficits in activity tolerance, balance, endurance. Today limited to transfer from bed>BSC>chair with Pt able to perform UB and LB ADL at min guard to supervision level. HR did elevate to 120 with activity. She is motivated and will benefit from skilled OT in the acute setting - but at this point do not anticipate the need for post-acute OT. Next session to bring energy conservation education, and continue OOB activity. 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      Recommendations for follow up therapy are one component of a multi-disciplinary discharge planning process, led by the attending physician.  Recommendations may be updated based on patient status, additional functional criteria and insurance authorization.   Assistance Recommended at Discharge PRN  Patient can return home with the following A little help with walking and/or transfers;A little help with bathing/dressing/bathroom    Functional Status Assessment  Patient has had a recent decline in their functional status and demonstrates the ability to make significant improvements in function in a reasonable and predictable amount of time.  Equipment  Recommendations  None recommended by OT    Recommendations for Other Services PT consult     Precautions / Restrictions Precautions Precautions: Fall Restrictions Weight Bearing Restrictions: No      Mobility Bed Mobility Overal bed mobility: Needs Assistance Bed Mobility: Supine to Sit     Supine to sit: Supervision     General bed mobility comments: supervision for line management    Transfers Overall transfer level: Needs assistance Equipment used: None Transfers: Sit to/from Stand Sit to Stand: Supervision           General transfer comment: min guard initially and progressed to supervision for safety      Balance Overall balance assessment: Needs assistance Sitting-balance support: No upper extremity supported, Feet supported Sitting balance-Leahy Scale: Good     Standing balance support: No upper extremity supported, During functional activity Standing balance-Leahy Scale: Fair Standing balance comment: mild imbalance and sway with intermittent reaching for support                           ADL either performed or assessed with clinical judgement   ADL Overall ADL's : Needs assistance/impaired Eating/Feeding: Modified independent   Grooming: Wash/dry face;Wash/dry hands;Set up;Sitting Grooming Details (indicate cue type and reason): in recliner Upper Body Bathing: Min guard   Lower Body Bathing: Min guard   Upper Body Dressing : Set up   Lower Body Dressing: Set up;Sitting/lateral leans Lower Body Dressing Details (indicate cue type and reason): socks and underwear change Toilet Transfer: Min guard;Stand-pivot;BSC/3in1   Toileting- Clothing Manipulation and Hygiene: Min guard;Sit to/from stand Toileting - Clothing Manipulation Details (indicate cue type and reason): peri care as well as underwear change  Functional mobility during ADLs: Min guard (SPT only) General ADL Comments: decreased activity tolerance     Vision  Ability to See in Adequate Light: 0 Adequate Patient Visual Report: No change from baseline Vision Assessment?: No apparent visual deficits     Perception     Praxis      Pertinent Vitals/Pain Pain Assessment Pain Assessment: Faces Faces Pain Scale: Hurts a little bit Pain Location: generalized, been dealing with nose bleeds Pain Descriptors / Indicators: Aching Pain Intervention(s): Monitored during session, Repositioned, Limited activity within patient's tolerance     Hand Dominance Right   Extremity/Trunk Assessment Upper Extremity Assessment Upper Extremity Assessment: Overall WFL for tasks assessed   Lower Extremity Assessment Lower Extremity Assessment: Defer to PT evaluation   Cervical / Trunk Assessment Cervical / Trunk Assessment: Normal   Communication Communication Communication: No difficulties   Cognition Arousal/Alertness: Awake/alert Behavior During Therapy: WFL for tasks assessed/performed Overall Cognitive Status: Within Functional Limits for tasks assessed                                 General Comments: very pleasant, enjoys music     General Comments  HR up to 120 with stand pivot    Exercises     Shoulder Instructions      Home Living Family/patient expects to be discharged to:: Private residence Living Arrangements: Spouse/significant other;Children (54 and 41 year old) Available Help at Discharge: Family;Available 24 hours/day Type of Home: House Home Access: Stairs to enter [employer] of Steps: 1 threshold Entrance Stairs-Rails: None Home Layout: Two level;Able to live on main level with bedroom/bathroom;Full bath on main level Alternate Level Stairs-Number of Steps: stays on first level   Bathroom Shower/Tub: Producer, television/film/video: Standard     Home Equipment: Shower seat - built in   Additional Comments: husband works but has wonderful family and friend support who provide care for pt at all  times when she can she works as a Clinical cytogeneticist at [employer]. Loves musicals and drama (fav is Wicked)      Prior Functioning/Environment Prior Level of Function : Independent/Modified Independent;Driving             Mobility Comments: pt independent with mobility at baseline, recently been more fatigued as pt undergoing cancer treatment. Pt able to drive but family often provides transport          OT Problem List: Decreased strength;Decreased activity tolerance;Impaired balance (sitting and/or standing)      OT Treatment/Interventions: Self-care/ADL training;Energy conservation;DME and/or AE instruction;Therapeutic activities;Patient/family education;Balance training    OT Goals(Current goals can be found in the care plan section) Acute Rehab OT Goals Patient Stated Goal: keep nose from bleeding further, get stronger and maintain level of independence OT Goal Formulation: With patient Time For Goal Achievement: 05/14/22 Potential to Achieve Goals: Good ADL Goals Pt Will Perform Grooming: with modified independence;standing Pt Will Perform Upper Body Dressing: with modified independence;sitting Pt Will Perform Lower Body Dressing: with modified independence;sit to/from stand Pt Will Transfer to Toilet: with modified independence;ambulating Pt Will Perform Toileting - Clothing Manipulation and hygiene: with modified independence;sit to/from stand Additional ADL Goal #1: Pt will verbalize at least 3 strategies for energy conservation with no cues  OT Frequency: Min 2X/week    Co-evaluation              AM-PAC OT "6 Clicks" Daily Activity  Outcome Measure Help from another person eating meals?: None Help from another person taking care of personal grooming?: A Little Help from another person toileting, which includes using toliet, bedpan, or urinal?: A Little Help from another person bathing (including washing, rinsing, drying)?: A Little Help  from another person to put on and taking off regular upper body clothing?: None Help from another person to put on and taking off regular lower body clothing?: None 6 Click Score: 21   End of Session Equipment Utilized During Treatment: Gait belt;Oxygen (humidifier for nose bleeds) Nurse Communication: Mobility status  Activity Tolerance: Patient tolerated treatment well Patient left: in chair;with call bell/phone within reach;with family/visitor present  OT Visit Diagnosis: Muscle weakness (generalized) (M62.81);Unsteadiness on feet (R26.81)                Time: [id number] OT Time Calculation (min): 29 min Charges:  OT General Charges $OT Visit: 1 Visit OT Evaluation $OT Eval Moderate Complexity: 1 Mod OT Treatments $Self Care/Home Management : 8-22 mins Nyoka CowdenLaura H Clark Acute Rehabilitation Services Office: [phone]  Carla BioLaura [facility] 04/30/2022, 6:07 PM

## 2022-04-30 NOTE — Progress Notes (Signed)
Gastroenterology Inpatient Follow Up    Subjective: Patient has been trying to eat.  Denies any abdominal pain.  Mentation is good.  Objective: Vital signs in last 24 hours: Temp:  [97.5 F (36.4 C)-98.7 F (37.1 C)] 97.5 F (36.4 C) (04/06 1258) Pulse Rate:  [78-111] 111 (04/06 1258) Resp:  [10-17] 17 (04/06 1258) BP: (106-129)/(74-92) 129/90 (04/06 1258) SpO2:  [97 %-100 %] 99 % (04/06 1258) Last BM Date : 04/29/22  Intake/Output from previous day: 04/05 0701 - 04/06 0700 In: 829.2 [I.V.:632.8; IV Piggyback:196.5] Out: 1100 [Urine:1000; Emesis/NG output:100] Intake/Output this shift: Total I/O In: 276 [Blood:276] Out: -   General appearance: alert and cooperative Resp: no increased WOB Cardio: mildly tachycardic GI: non-distended, non-tender  Lab Results: Recent Labs    05/06/2022 2140 04/29/22 0624 04/30/22 0830  WBC 4.9 3.8* 2.7*  HGB 8.3* 8.0* 8.3*  HCT 26.1* 24.9* 25.0*  PLT 141* 138* 146*   BMET Recent Labs    05/22/2022 1031 04/29/22 0624 04/30/22 0220  NA 130* 133* 133*  K 3.8 3.3* 3.9  CL 106 109 109  CO2 17* 16* 17*  GLUCOSE 118* 97 121*  BUN 6 6 5*  CREATININE 0.39* 0.36* 0.42*  CALCIUM 7.8* 6.8* 7.6*   LFT Recent Labs    04/30/22 0220  PROT 5.7*  ALBUMIN 2.7*  AST 271*  ALT 444*  ALKPHOS 1,002*  BILITOT 1.3*   PT/INR Recent Labs    04/29/22 1206 04/30/22 0220  LABPROT 15.9* 15.3*  INR 1.3* 1.2   Hepatitis Panel No results for input(s): "HEPBSAG", "HCVAB", "HEPAIGM", "HEPBIGM" in the last 72 hours. C-Diff No results for input(s): "CDIFFTOX" in the last 72 hours.  Studies/Results: CT ANGIO HEAD NECK W WO CM  Result Date: 04/29/2022 CLINICAL DATA:  Stroke/TIA.  The term in embolic source. EXAM: CT ANGIOGRAPHY HEAD AND NECK TECHNIQUE: Multidetector CT imaging of the head and neck was performed using the standard protocol during bolus administration of intravenous contrast. Multiplanar CT image reconstructions and MIPs were  obtained to evaluate the vascular anatomy. Carotid stenosis measurements (when applicable) are obtained utilizing NASCET criteria, using the distal internal carotid diameter as the denominator. RADIATION DOSE REDUCTION: This exam was performed according to the departmental dose-optimization program which includes automated exposure control, adjustment of the mA and/or kV according to patient size and/or use of iterative reconstruction technique. CONTRAST:  46mL OMNIPAQUE IOHEXOL 350 MG/ML SOLN COMPARISON:  CT and MRI of the brain April 28, 2022. FINDINGS: CT HEAD FINDINGS Brain: No acute territorial infarct. Known small scattered subacute infarcts are better appreciated on prior. No evidence of hemorrhage, hydrocephalus, extra-axial collection or mass lesion/mass effect. Vascular: No hyperdense vessel or unexpected calcification. Skull: Negative for fracture. Sinuses/Orbits: No acute finding. Other: None. Review of the MIP images confirms the above findings CTA NECK FINDINGS Aortic arch: Standard branching. Imaged portion shows no evidence of aneurysm or dissection. No significant stenosis of the major arch vessel origins. Right carotid system: No evidence of dissection, stenosis (50% or greater), or occlusion. Left carotid system: No evidence of dissection, stenosis (50% or greater), or occlusion. Vertebral arteries: Codominant. No evidence of dissection, stenosis (50% or greater), or occlusion. Skeleton: Diffuse multifocal lytic lesions within the visualized spine consistent with known metastatic osseous disease. Other neck: Negative. Upper chest: Negative. Review of the MIP images confirms the above findings CTA HEAD FINDINGS Anterior circulation: No significant stenosis, proximal occlusion, aneurysm, or vascular malformation. Posterior circulation: No significant stenosis, proximal occlusion, aneurysm, or  vascular malformation. Venous sinuses: As permitted by contrast timing, patent. Anatomic variants: None  significant. Review of the MIP images confirms the above findings IMPRESSION: 1. No acute intracranial abnormality. Known small scattered subacute infarcts are better appreciated on prior MRI. 2. No evidence of dissection, hemodynamically significant stenosis or occlusion of the major neck arteries. 3. No significant stenosis, proximal occlusion, aneurysm, or vascular malformation of the intracranial circulation. 4. Diffuse multifocal lytic lesions within the visualized spine consistent with known metastatic osseous disease. Electronically Signed   By: Baldemar Lenis M.D.   On: 04/29/2022 16:49   VAS [place] LOWER EXTREMITY VENOUS (DVT)  Result Date: 04/29/2022  Lower Venous DVT Study Patient Name:  Carla Clark  Date of Exam:   04/29/2022 Medical Rec #: [id number]       Accession #:    [id number] Date of Birth: 03/05/1985        Patient Gender: F Patient Age:   37 years Exam Location:  [facility] Procedure:      VAS [place] LOWER EXTREMITY VENOUS (DVT) Referring Phys: DAWOOD ELGERGAWY --------------------------------------------------------------------------------  Indications: Swelling, SOB, and stroke.  Risk Factors: Cancer breast. Comparison Study: No prior study. Performing Technologist: McKayla Maag RVT, [place]  Examination Guidelines: A complete evaluation includes B-mode imaging, spectral Doppler, color Doppler, and power Doppler as needed of all accessible portions of each vessel. Bilateral testing is considered an integral part of a complete examination. Limited examinations for reoccurring indications may be performed as noted. The reflux portion of the exam is performed with the patient in reverse Trendelenburg.  +---------+---------------+---------+-----------+----------+--------------+ RIGHT    CompressibilityPhasicitySpontaneityPropertiesThrombus Aging +---------+---------------+---------+-----------+----------+--------------+ CFV      Full           Yes      Yes                                  +---------+---------------+---------+-----------+----------+--------------+ SFJ      Full                                                        +---------+---------------+---------+-----------+----------+--------------+ FV Prox  Full                                                        +---------+---------------+---------+-----------+----------+--------------+ FV Mid   Full                                                        +---------+---------------+---------+-----------+----------+--------------+ FV DistalFull                                                        +---------+---------------+---------+-----------+----------+--------------+ PFV      Full                                                        +---------+---------------+---------+-----------+----------+--------------+  POP      Full           Yes      Yes                                 +---------+---------------+---------+-----------+----------+--------------+ PTV      Full                                                        +---------+---------------+---------+-----------+----------+--------------+ PERO     Full                                                        +---------+---------------+---------+-----------+----------+--------------+   +---------+---------------+---------+-----------+----------+--------------+ LEFT     CompressibilityPhasicitySpontaneityPropertiesThrombus Aging +---------+---------------+---------+-----------+----------+--------------+ CFV      Full           Yes      Yes                                 +---------+---------------+---------+-----------+----------+--------------+ SFJ      Full                                                        +---------+---------------+---------+-----------+----------+--------------+ FV Prox  Full                                                         +---------+---------------+---------+-----------+----------+--------------+ FV Mid   Full                                                        +---------+---------------+---------+-----------+----------+--------------+ FV DistalFull                                                        +---------+---------------+---------+-----------+----------+--------------+ PFV      Full                                                        +---------+---------------+---------+-----------+----------+--------------+ POP      Full           Yes      Yes                                 +---------+---------------+---------+-----------+----------+--------------+  PTV      Full                                                        +---------+---------------+---------+-----------+----------+--------------+ PERO     Full                                                        +---------+---------------+---------+-----------+----------+--------------+     Summary: BILATERAL: - No evidence of deep vein thrombosis seen in the lower extremities, bilaterally. - No evidence of superficial venous thrombosis in the lower extremities, bilaterally. -No evidence of popliteal cyst, bilaterally.   *See table(s) above for measurements and observations. Electronically signed by Waverly Ferrari MD on 04/29/2022 at 4:06:08 PM.    Final    ECHOCARDIOGRAM COMPLETE  Result Date: 04/29/2022    ECHOCARDIOGRAM REPORT   Patient Name:   Carla Clark Date of Exam: 04/29/2022 Medical Rec #:  [id number]      Height:       63.0 in Accession #:    [id number]     Weight:       114.0 lb Date of Birth:  [birth year]       BSA:          1.523 m Patient Age:    36 years       BP:           122/75 mmHg Patient Gender: F              HR:           85 bpm. Exam Location:  Inpatient Procedure: 2D Echo, Cardiac Doppler, Color Doppler, Strain Analysis and 3D Echo Indications:    stroke  History:        Patient has no prior history  of Echocardiogram examinations.  Sonographer:    Mike Gip Referring Phys: [id number] DENISE A WOLFE IMPRESSIONS  1. Left ventricular ejection fraction, by estimation, is 65 to 70%. Left ventricular ejection fraction by 3D volume is 65 %. The left ventricle has normal function. The left ventricle has no regional wall motion abnormalities. Left ventricular diastolic  parameters were normal. The average left ventricular global longitudinal strain is -20.6 %. The global longitudinal strain is normal.  2. Right ventricular systolic function is normal. The right ventricular size is normal. There is normal pulmonary artery systolic pressure. The estimated right ventricular systolic pressure is 33.6 mmHg.  3. The mitral valve is normal in structure. Mild mitral valve regurgitation. No evidence of mitral stenosis.  4. The aortic valve is normal in structure. Aortic valve regurgitation is not visualized. No aortic stenosis is present.  5. The inferior vena cava is dilated in size with >50% respiratory variability, suggesting right atrial pressure of 8 mmHg. FINDINGS  Left Ventricle: Left ventricular ejection fraction, by estimation, is 65 to 70%. Left ventricular ejection fraction by 3D volume is 65 %. The left ventricle has normal function. The left ventricle has no regional wall motion abnormalities. The average left ventricular global longitudinal strain is -20.6 %. The global longitudinal strain is normal. The left ventricular internal cavity size was normal in size. There is no left  ventricular hypertrophy. Left ventricular diastolic parameters were normal. Normal left ventricular filling pressure. Right Ventricle: The right ventricular size is normal. No increase in right ventricular wall thickness. Right ventricular systolic function is normal. There is normal pulmonary artery systolic pressure. The tricuspid regurgitant velocity is 2.53 m/s, and  with an assumed right atrial pressure of 8 mmHg, the estimated right  ventricular systolic pressure is 33.6 mmHg. Left Atrium: Left atrial size was normal in size. Right Atrium: Right atrial size was normal in size. Pericardium: There is no evidence of pericardial effusion. Mitral Valve: The mitral valve is normal in structure. Mild mitral valve regurgitation. No evidence of mitral valve stenosis. Tricuspid Valve: The tricuspid valve is normal in structure. Tricuspid valve regurgitation is trivial. No evidence of tricuspid stenosis. Aortic Valve: The aortic valve is normal in structure. Aortic valve regurgitation is not visualized. No aortic stenosis is present. Pulmonic Valve: The pulmonic valve was normal in structure. Pulmonic valve regurgitation is trivial. No evidence of pulmonic stenosis. Aorta: The aortic root is normal in size and structure. Venous: The inferior vena cava is dilated in size with greater than 50% respiratory variability, suggesting right atrial pressure of 8 mmHg. IAS/Shunts: No atrial level shunt detected by color flow Doppler.  LEFT VENTRICLE PLAX 2D LVIDd:         4.80 cm         Diastology LVIDs:         2.70 cm         LV e' medial:    12.50 cm/s LV PW:         0.80 cm         LV E/e' medial:  9.8 LV IVS:        0.80 cm         LV e' lateral:   16.00 cm/s                                LV E/e' lateral: 7.6  LV Volumes (MOD)               2D LV vol d, MOD    130.0 ml      Longitudinal A2C:                           Strain LV vol d, MOD    117.0 ml      2D Strain GLS  -22.6 % A4C:                           (A2C): LV vol s, MOD    37.4 ml       2D Strain GLS  -18.7 % A2C:                           (A3C): LV vol s, MOD    34.1 ml       2D Strain GLS  -25.0 % A4C:                           (A4C): LV SV MOD A2C:   92.6 ml       2D Strain GLS  -20.6 % LV SV MOD A4C:   117.0 ml      Avg: LV SV MOD BP:  87.3 ml                                3D Volume EF                                LV 3D EF:    Left                                             ventricul                                              ar                                             ejection                                             fraction                                             by 3D                                             volume is                                             65 %.                                 3D Volume EF:                                3D EF:        65 %                                LV EDV:       140 ml                                LV ESV:       50 ml                                LV SV:        91 ml RIGHT VENTRICLE  IVC RV Basal diam:  3.40 cm     IVC diam: 2.40 cm RV S prime:     18.60 cm/s TAPSE (M-mode): 2.4 cm LEFT ATRIUM             Index        RIGHT ATRIUM          Index LA diam:        3.30 cm 2.17 cm/m   RA Area:     9.87 cm LA Vol (A2C):   48.9 ml 32.11 ml/m  RA Volume:   16.90 ml 11.10 ml/m LA Vol (A4C):   38.1 ml 25.02 ml/m LA Biplane Vol: 44.0 ml 28.90 ml/m  AORTIC VALVE LVOT Vmax:   116.00 cm/s LVOT Vmean:  83.400 cm/s LVOT VTI:    0.266 m  AORTA Ao Asc diam: 2.50 cm MITRAL VALVE                  TRICUSPID VALVE MV Area (PHT): 3.13 cm       TR Peak grad:   25.6 mmHg MV Decel Time: 242 msec       TR Vmax:        253.00 cm/s MR Peak grad:    120.6 mmHg MR Mean grad:    81.0 mmHg    SHUNTS MR Vmax:         549.00 cm/s  Systemic VTI: 0.27 m MR Vmean:        428.0 cm/s MR PISA:         0.57 cm MR PISA Eff ROA: 4 mm MR PISA Radius:  0.30 cm MV E velocity: 122.00 cm/s MV A velocity: 95.50 cm/s MV E/A ratio:  1.28 Armanda Magicraci Turner MD Electronically signed by Armanda Magicraci Turner MD Signature Date/Time: 04/29/2022/11:12:54 AM    Final     Medications: I have reviewed the patient's current medications. Scheduled:  Chlorhexidine Gluconate Cloth  6 each Topical Daily   dexamethasone  20 mg Intravenous Q24H   furosemide  20 mg Intravenous Once   pantoprazole  40 mg Oral BID   saccharomyces boulardii  250 mg Oral BID   sodium chloride flush  10-40 mL  Intracatheter Q12H   vancomycin  125 mg Oral TID AC & HS   Continuous:  heparin 850 Units/hr (04/30/22 1211)   ZOX:[id number]:acetaminophen **OR** acetaminophen (TYLENOL) oral liquid 160 mg/5 mL **OR** acetaminophen, bacitracin, diphenhydrAMINE **AND** prochlorperazine, HYDROcodone-acetaminophen, lidocaine-prilocaine, LORazepam, melatonin, sodium chloride flush  Assessment/Plan: 37 year old female with history of ileocolonic Crohn's disease, recent C. 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 We are consulted for elevated LFTs with T. bili 1.4, alk phos 801, AST 227, and ALT 274 upon arrival. MRI of the abdomen 04/15/2022 showed no evidence of liver metastases.  LFTs have continued to uptrend.  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  Patient has been started on dexamethasone, which should also help with her immune mediated hepatitis.  We have sent several labs for additional liver workup as well -Trend LFTs and INR -Follow-up liver workup labs, including ASMA, ANA, IgG, AMA, CMV IgM and DNA quant, EBV antibodies -Continue steroids.  Will monitor patient's LFT response to steroid therapy.   LOS: 2 days   Imogene BurnYing C Madora Barletta 04/30/2022, 2:48 PM

## 2022-05-01 ENCOUNTER — Other Ambulatory Visit: Payer: Self-pay | Admitting: Hematology & Oncology

## 2022-05-01 DIAGNOSIS — Z515 Encounter for palliative care: Secondary | ICD-10-CM

## 2022-05-01 DIAGNOSIS — R040 Epistaxis: Secondary | ICD-10-CM | POA: Diagnosis not present

## 2022-05-01 DIAGNOSIS — Z7189 Other specified counseling: Secondary | ICD-10-CM

## 2022-05-01 DIAGNOSIS — R519 Headache, unspecified: Secondary | ICD-10-CM | POA: Diagnosis not present

## 2022-05-01 DIAGNOSIS — I639 Cerebral infarction, unspecified: Secondary | ICD-10-CM | POA: Diagnosis not present

## 2022-05-01 DIAGNOSIS — C50919 Malignant neoplasm of unspecified site of unspecified female breast: Secondary | ICD-10-CM | POA: Diagnosis not present

## 2022-05-01 DIAGNOSIS — G96198 Other disorders of meninges, not elsewhere classified: Secondary | ICD-10-CM | POA: Diagnosis not present

## 2022-05-01 DIAGNOSIS — R945 Abnormal results of liver function studies: Secondary | ICD-10-CM | POA: Diagnosis not present

## 2022-05-01 DIAGNOSIS — Z7901 Long term (current) use of anticoagulants: Secondary | ICD-10-CM | POA: Diagnosis not present

## 2022-05-01 LAB — BPAM RBC
Blood Product Expiration Date: 202404252359
Blood Product Expiration Date: 202404252359
ISSUE DATE / TIME: 202404060950
Unit Type and Rh: 7300

## 2022-05-01 LAB — COMPREHENSIVE METABOLIC PANEL
ALT: 368 U/L — ABNORMAL HIGH (ref 0–44)
AST: 178 U/L — ABNORMAL HIGH (ref 15–41)
Albumin: 2.9 g/dL — ABNORMAL LOW (ref 3.5–5.0)
Alkaline Phosphatase: 953 U/L — ABNORMAL HIGH (ref 38–126)
Anion gap: 9 (ref 5–15)
BUN: 7 mg/dL (ref 6–20)
CO2: 19 mmol/L — ABNORMAL LOW (ref 22–32)
Calcium: 7.6 mg/dL — ABNORMAL LOW (ref 8.9–10.3)
Chloride: 109 mmol/L (ref 98–111)
Creatinine, Ser: 0.46 mg/dL (ref 0.44–1.00)
GFR, Estimated: >60 mL/min (ref >60)
Glucose, Bld: 114 mg/dL — ABNORMAL HIGH (ref 70–99)
Potassium: 3.7 mmol/L (ref 3.5–5.1)
Sodium: 137 mmol/L (ref 135–145)
Total Bilirubin: 2 mg/dL — ABNORMAL HIGH (ref 0.3–1.2)
Total Protein: 6.1 g/dL — ABNORMAL LOW (ref 6.5–8.1)

## 2022-05-01 LAB — CBC WITH DIFFERENTIAL/PLATELET
Abs Immature Granulocytes: 0.03 10*3/uL (ref 0.00–0.07)
Basophils Absolute: 0 10*3/uL (ref 0.0–0.1)
Basophils Relative: 0 %
Eosinophils Absolute: 0.9 10*3/uL — ABNORMAL HIGH (ref 0.0–0.5)
Eosinophils Relative: 22 %
HCT: 33.4 % — ABNORMAL LOW (ref 36.0–46.0)
Hemoglobin: 12 g/dL (ref 12.0–15.0)
Immature Granulocytes: 1 %
Lymphocytes Relative: 55 %
Lymphs Abs: 2.3 10*3/uL (ref 0.7–4.0)
MCH: 30.6 pg (ref 26.0–34.0)
MCHC: 35.9 g/dL (ref 30.0–36.0)
MCV: 85.2 fL (ref 80.0–100.0)
Monocytes Absolute: 0.1 10*3/uL (ref 0.1–1.0)
Monocytes Relative: 2 %
Neutro Abs: 0.8 10*3/uL — ABNORMAL LOW (ref 1.7–7.7)
Neutrophils Relative %: 20 %
Platelets: 126 10*3/uL — ABNORMAL LOW (ref 150–400)
RBC: 3.92 MIL/uL (ref 3.87–5.11)
RDW: 21.3 % — ABNORMAL HIGH (ref 11.5–15.5)
WBC: 4.1 10*3/uL (ref 4.0–10.5)
nRBC: 0 % (ref 0.0–0.2)

## 2022-05-01 LAB — TYPE AND SCREEN
ABO/RH(D): B POS
Antibody Screen: NEGATIVE
Unit division: 0
Unit division: 0

## 2022-05-01 LAB — PROTIME-INR
INR: 1.2 (ref 0.8–1.2)
Prothrombin Time: 14.7 seconds (ref 11.4–15.2)

## 2022-05-01 LAB — CMV IGM: CMV IgM: 40.1 AU/mL — ABNORMAL HIGH (ref 0.0–29.9)

## 2022-05-01 LAB — CERULOPLASMIN: Ceruloplasmin: 28.5 mg/dL (ref 19.0–39.0)

## 2022-05-01 LAB — HEPARIN LEVEL (UNFRACTIONATED): Heparin Unfractionated: 0.43 IU/mL (ref 0.30–0.70)

## 2022-05-01 LAB — IGG: IgG (Immunoglobin G), Serum: 996 mg/dL (ref 586–1602)

## 2022-05-01 LAB — EPSTEIN-BARR VIRUS (EBV) ANTIBODY PROFILE
EBV NA IgG: 400 U/mL — ABNORMAL HIGH (ref 0.0–17.9)
EBV VCA IgG: >600 U/mL — ABNORMAL HIGH (ref 0.0–17.9)
EBV VCA IgM: 89.1 U/mL — ABNORMAL HIGH (ref 0.0–35.9)

## 2022-05-01 LAB — ALPHA-1-ANTITRYPSIN: A-1 Antitrypsin, Ser: 231 mg/dL — ABNORMAL HIGH (ref 100–188)

## 2022-05-01 MED ORDER — OLAPARIB 150 MG PO TABS
300.0000 mg | ORAL_TABLET | Freq: Two times a day (BID) | ORAL | 6 refills | Status: DC
  Filled 2022-05-01: qty 120, 30d supply, fill #0

## 2022-05-01 MED ORDER — AMOXICILLIN-POT CLAVULANATE 875-125 MG PO TABS
1.0000 | ORAL_TABLET | Freq: Two times a day (BID) | ORAL | Status: DC
  Administered 2022-05-01 – 2022-05-03 (×6): 1 via ORAL
  Filled 2022-05-01 (×7): qty 1

## 2022-05-01 MED ORDER — ENSURE ENLIVE PO LIQD
237.0000 mL | Freq: Three times a day (TID) | ORAL | Status: DC
  Administered 2022-05-01 – 2022-05-09 (×18): 237 mL via ORAL

## 2022-05-01 MED ORDER — FAMOTIDINE 40 MG/5ML PO SUSR
20.0000 mg | Freq: Two times a day (BID) | ORAL | Status: DC
  Administered 2022-05-01: 20 mg via ORAL
  Filled 2022-05-01 (×2): qty 2.5

## 2022-05-01 MED ORDER — MELATONIN 3 MG PO TABS
6.0000 mg | ORAL_TABLET | Freq: Every evening | ORAL | Status: DC | PRN
  Administered 2022-05-01 – 2022-05-05 (×2): 6 mg via ORAL
  Filled 2022-05-01 (×3): qty 2

## 2022-05-01 NOTE — Progress Notes (Signed)
Patient has had persistent bleeding from the right side of the nose through the night and into this morning.  Patient continues to be maintained on IV heparin due to high risk for cerebrovascular embolic events.  I removed a large clot from the right side of the nose and placed a Merisel pack on that side soaked in Afrin.  At this point I would leave both packs in place for 3-5 more days.  Consideration should be given to starting an antibiotic to cover sinus pathogens.  I have left a bottle of Afrin in the room and would recommend applying Afrin to both Merisel packs if bleeding recurs.  Her bleeding currently is controlled.  Appreciate Dr. Alene Mires input.  I agree that it may be time to consider palliative care or at least discussing end-of-life issues with the patient and her husband.  I suspect Dr. Myna Hidalgo has had this conversation at this point in her care and would obviously defer to that relationship primarily.  At this point, if the patient bleeds through bilateral packs in a significant way (hemodynamic instability or drop in Hb), transfuse as necessary and stop the heparin.  Dx: Epistaxis - bilateral (complex)

## 2022-05-01 NOTE — Progress Notes (Signed)
Pt still endorsing nose bleeding from rt nares.it was a continuous trickle of blood.This morning she spat little amount of  blood.oncologist came by to see her,verbal order from oncologist to stop the heparin drip for now.

## 2022-05-01 NOTE — Progress Notes (Signed)
Subjective: Had some bleeding from her nose on the right. No headache.   Exam: Vitals:   05/01/22 0317 05/01/22 0800  BP: 121/79 128/85  Pulse: 74 93  Resp: 12 17  Temp: 97.8 F (36.6 C) 98.3 F (36.8 C)  SpO2: 98% 97%   Gen: In bed, NAD Resp: non-labored breathing, no acute distress Abd: soft, nt  Neuro: MS: Awake, alert, interactive and appropriate, oriented.   Pertinent Labs: LFTs continue to be elevated  Impression: 37 year old female with multifocal acute ischemic strokes of different ages likely secondary to hypercoagulable state due to malignancy.  Given her clinical picture, I think anticoagulation is indicated, and therefore doubt that a transesophageal echocardiogram would be likely to change her management.  Given the fact that the infarcts have different ages, she has accumulated multiple strokes over the course of only a few weeks and therefore I think she is at high risk of continued embolic infarcts.  This is counterbalanced by the fact that she has continued bleeding from her nose. If we are able to get her nosebleeds under control, then I would favor continued anticoagulation, but if she is bleeding enough to require frequent transfusions, then I am not certain that this would be possible to continue.  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  Typically without treatment, leptomeningeal spread of solid tumors is associated with a fairly poor prognosis, on the order of a few months.    She also has significant elevations in her liver enzymes, though her INR remains relatively normal. 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  At this point, she has multiple competing problems and I do worry that with her poor prognosis associated with her leptomeningeal spread, we may be getting to a point where we need to start thinking about limiting care if she were to become in extremis from  cardiac arrest.  I broached this with the patient and her husband today, for now she is full code, but she is open to talking through things with palliative care.  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 Please call with further questions or concerns.   Ritta Slot, MD [facility] [phone]  If 7pm- 7am, please page neurology on call as listed in AMION.

## 2022-05-01 NOTE — Progress Notes (Signed)
Gastroenterology Inpatient Follow Up    Subjective: Patient did not sleep well last night.  She took melatonin 3 mg last night but would like to increase this to 6 mg tonight.  She has been trying to eat.   Objective: Vital signs in last 24 hours: Temp:  [97.6 F (36.4 C)-98.3 F (36.8 C)] 98.2 F (36.8 C) (04/07 1158) Pulse Rate:  [74-104] 97 (04/07 1158) Resp:  [12-17] 15 (04/07 1158) BP: (118-128)/(79-86) 122/84 (04/07 1158) SpO2:  [97 %-99 %] 99 % (04/07 1158) Last BM Date : 04/29/22  Intake/Output from previous day: 04/06 0701 - 04/07 0700 In: 658 [Blood:658] Out: -  Intake/Output this shift: No intake/output data recorded.  General appearance: alert and cooperative Resp: no increased WOB Cardio: mildly tachycardic GI: non-distended, non-tender  Lab Results: Recent Labs    04/29/22 0624 04/30/22 0830 04/30/22 1750 05/01/22 0333  WBC 3.8* 2.7*  --  4.1  HGB 8.0* 8.3* 13.6 12.0  HCT 24.9* 25.0* 39.4 33.4*  PLT 138* 146*  --  126*   BMET Recent Labs    04/29/22 0624 04/30/22 0220 05/01/22 0333  NA 133* 133* 137  K 3.3* 3.9 3.7  CL 109 109 109  CO2 16* 17* 19*  GLUCOSE 97 121* 114*  BUN 6 5* 7  CREATININE 0.36* 0.42* 0.46  CALCIUM 6.8* 7.6* 7.6*   LFT Recent Labs    05/01/22 0333  PROT 6.1*  ALBUMIN 2.9*  AST 178*  ALT 368*  ALKPHOS 953*  BILITOT 2.0*   PT/INR Recent Labs    04/30/22 0220 05/01/22 0333  LABPROT 15.3* 14.7  INR 1.2 1.2   Hepatitis Panel No results for input(s): "HEPBSAG", "HCVAB", "HEPAIGM", "HEPBIGM" in the last 72 hours. C-Diff No results for input(s): "CDIFFTOX" in the last 72 hours.  Studies/Results: CT ANGIO HEAD NECK W WO CM  Result Date: 04/29/2022 CLINICAL DATA:  Stroke/TIA.  The term in embolic source. EXAM: CT ANGIOGRAPHY HEAD AND NECK TECHNIQUE: Multidetector CT imaging of the head and neck was performed using the standard protocol during bolus administration of intravenous contrast. Multiplanar CT  image reconstructions and MIPs were obtained to evaluate the vascular anatomy. Carotid stenosis measurements (when applicable) are obtained utilizing NASCET criteria, using the distal internal carotid diameter as the denominator. RADIATION DOSE REDUCTION: This exam was performed according to the departmental dose-optimization program which includes automated exposure control, adjustment of the mA and/or kV according to patient size and/or use of iterative reconstruction technique. CONTRAST:  4mL OMNIPAQUE IOHEXOL 350 MG/ML SOLN COMPARISON:  CT and MRI of the brain April 28, 2022. FINDINGS: CT HEAD FINDINGS Brain: No acute territorial infarct. Known small scattered subacute infarcts are better appreciated on prior. No evidence of hemorrhage, hydrocephalus, extra-axial collection or mass lesion/mass effect. Vascular: No hyperdense vessel or unexpected calcification. Skull: Negative for fracture. Sinuses/Orbits: No acute finding. Other: None. Review of the MIP images confirms the above findings CTA NECK FINDINGS Aortic arch: Standard branching. Imaged portion shows no evidence of aneurysm or dissection. No significant stenosis of the major arch vessel origins. Right carotid system: No evidence of dissection, stenosis (50% or greater), or occlusion. Left carotid system: No evidence of dissection, stenosis (50% or greater), or occlusion. Vertebral arteries: Codominant. No evidence of dissection, stenosis (50% or greater), or occlusion. Skeleton: Diffuse multifocal lytic lesions within the visualized spine consistent with known metastatic osseous disease. Other neck: Negative. Upper chest: Negative. Review of the MIP images confirms the above findings CTA HEAD FINDINGS  Anterior circulation: No significant stenosis, proximal occlusion, aneurysm, or vascular malformation. Posterior circulation: No significant stenosis, proximal occlusion, aneurysm, or vascular malformation. Venous sinuses: As permitted by contrast timing,  patent. Anatomic variants: None significant. Review of the MIP images confirms the above findings IMPRESSION: 1. No acute intracranial abnormality. Known small scattered subacute infarcts are better appreciated on prior MRI. 2. No evidence of dissection, hemodynamically significant stenosis or occlusion of the major neck arteries. 3. No significant stenosis, proximal occlusion, aneurysm, or vascular malformation of the intracranial circulation. 4. Diffuse multifocal lytic lesions within the visualized spine consistent with known metastatic osseous disease. Electronically Signed   By: Baldemar Lenis M.D.   On: 04/29/2022 16:49    Medications: I have reviewed the patient's current medications. Scheduled:  amoxicillin-clavulanate  1 tablet Oral Q12H   Chlorhexidine Gluconate Cloth  6 each Topical Daily   dexamethasone  20 mg Intravenous Q24H   famotidine  20 mg Oral BID   feeding supplement  237 mL Oral TID BM   saccharomyces boulardii  250 mg Oral BID   sodium chloride flush  10-40 mL Intracatheter Q12H   vancomycin  125 mg Oral TID AC & HS   Continuous:   OMV:[id number] **OR** acetaminophen (TYLENOL) oral liquid 160 mg/5 mL **OR** acetaminophen, bacitracin, diphenhydrAMINE **AND** prochlorperazine, HYDROcodone-acetaminophen, lidocaine-prilocaine, LORazepam, melatonin, sodium chloride flush  Assessment/Plan: 37 year old female with history of ileocolonic Crohn's disease, recent C. 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 We are consulted for elevated LFTs with T. bili 1.4, alk phos 801, AST 227, and ALT 274 upon arrival. MRI of the abdomen 04/15/2022 showed no evidence of liver metastases.  LFTs have continued to uptrend.  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 Interestingly CMV IgM and EBV IgM came back as positive so will get ID involved to see if these are concerning for active infection. -Trend LFTs and INR -Follow-up liver workup labs, including ASMA, ANA, IgG, AMA, CMV DNA quant. CMV IgM and EBV IgM were positive. Will get ID on board to see if they are concerned about active CMV or EBV infection -Continue steroids.  Will monitor patient's LFT response to steroid therapy.   LOS: 3 days   Imogene Burn 05/01/2022, 3:03 PM

## 2022-05-01 NOTE — Progress Notes (Signed)
Daily Progress Note   Patient Name: Carla Clark       Date: 05/01/2022 DOB: October 05, 1985  Age: 37 y.o. MRN#: [id number] Attending Physician: Starleen Arms, MD Primary Care Physician: Bradd Canary, MD Admit Date: 04/28/2022  Reason for Consultation/Follow-up: Establishing goals of care  Subjective: Medical records reviewed including progress notes, labs, imaging. Patient assessed at the bedside.  Her husband is present visiting.  She reports sleeping poorly overnight due to nosebleeds from her right nostril.  She is still in good spirits.  She feels her nosebleed is improved after additional packing by ENT today.  Created space and opportunity for patient and her husband's thoughts and feelings on her current illness.  We reviewed the advance directive packet's questions that they have answered overnight in regards to her treatment preferences.  She does not want to suffer like her dad did and greatly appreciated being able to be present as he passed peacefully.  She is torn between full code and DNR.  Ultimately, it is more important for her to know that family will have good final memories with her rather than to pursue the slim chance of a successful resuscitation.  Patient's husband is in agreement with this, as he does not feel he could stand to watch her go through repeated CPR attempts or the resulting damage to her body.  Recommended consideration of DNR status, understanding evidenced-based poor outcomes in similar hospitalized patients, as the cause of the arrest is likely associated with chronic/terminal disease rather than a reversible acute cardio-pulmonary event.  Counseled that she would likely not be a candidate for cancer treatment for quite some time during her recovery if  cardiopulmonary resuscitation were to be successful.  In light of this, she would like to be DNR and continue with full scope treatment up until the point of cardiac arrest.  Assisted with filling out advanced directive packet and preparation for notary to finalize tomorrow.  Dr. Amada Jupiter visited during our conversation and I shared with him that family is interested in learning more about symptoms to expect as leptomeningeal disease progression evolved.  Questions and concerns addressed. PMT will continue to support holistically.   Length of Stay: 3   Physical Exam Vitals and nursing note reviewed.  Constitutional:      General: She is not in  acute distress. HENT:     Nose:     Right Nostril: Epistaxis present.  Cardiovascular:     Rate and Rhythm: Normal rate.  Pulmonary:     Effort: Pulmonary effort is normal.  Neurological:     Mental Status: She is alert and oriented to person, place, and time.  Psychiatric:        Mood and Affect: Mood normal.        Behavior: Behavior normal.            Vital Signs: BP 128/85   Pulse 93   Temp 98.3 F (36.8 C) (Axillary)   Resp 17   Ht 5\' 3"  (1.6 m)   Wt 51.7 kg   LMP 02/19/2022   SpO2 97%   BMI 20.19 kg/m  SpO2: SpO2: 97 % O2 Device: O2 Device: Room Air O2 Flow Rate:        Palliative Assessment/Data: 60%   Palliative Care Assessment & Plan   Patient Profile: 37 y.o. female  with past medical history of metastatic breast cancer currently undergoing chemotherapy, Crohn's disease with ileitis s/p left hemicolectomy, SBO, and gerd admitted on 04/28/2022 with headaches and bloody emesis.    Patient initially presented to [facility] ED then returned due to persistent symptoms. MRI of the brain noted 1.4 focus of enhancement within or along the right postcentral sulcus with adjacent cortical edema, 1.3 focus of enhancement along the medial left parietal lobe with adjacent cortical edema, multiple infarcts noted within the  bilateral frontal parietal lobes of various ages thought to be embolic in nature, and prominent enhancement bilateral 7th and 8th cranial nerves within the auditory canals concern for leptomeningeal metastatic disease.   PMT has been consulted to assist with goals of care conversation.  Assessment: Goals of care conversation Multiple CVAs Breast cancer with metastases and leptomeningeal spread Epistaxis  Recommendations/Plan: CODE STATUS changed to DNR.  Gold form was signed and placed in patient's shadow chart.  Will scan copy into EMR. Continue full scope treatment otherwise Goal is to return home, pursue further cancer treatment, live as long as possible with good quality of life Patient and family are in favor of transitioning to comfort focused care when her quality of life becomes unacceptable  AD to be notarized tomorrow 4/8 Spiritual care consult Psychosocial and emotional support provided Ongoing goals of care discussions PMT will continue to follow and support   Prognosis: Poor prognosis with metastatic breast cancer and new leptomeningeal spread   Discharge Planning: To Be Determined  Care plan was discussed with patient, patient's husband, Dr. Randol Kern, Dr. Amada Jupiter, Dr. Myna Hidalgo   Total time: I spent 85 minutes in the care of the patient today in the above activities and documenting the encounter.  MDM High         Whitten Andreoni Jeni Salles, PA-C  Palliative Medicine Team Team phone # [phone]  Thank you for allowing the Palliative Medicine Team to assist in the care of this patient. Please utilize secure chat with additional questions, if there is no response within 30 minutes please call the above phone number.  Palliative Medicine Team providers are available by phone from 7am to 7pm daily and can be reached through the team cell phone.  Should this patient require assistance outside of these hours, please call the patient's attending physician.

## 2022-05-01 NOTE — Progress Notes (Signed)
Carla Clark still is oozing from the right nares.  I will stop the heparin for right now.  I do appreciate the help from Otolaryngology.  I know that Gastroenterology has seen her.  They would like to have her on steroids for the elevated LFTs.  I have her on Decadron.  She does not have any headaches.  She is eating maybe a little bit better.  She did get 2 units of blood yesterday.  She tolerated this well.  All of her labs look okay.  Her LFTs might be a little bit better.  Her SGPT is 368 and the SGOT is 178.  Alkaline phosphatase is 953.  Her white cell count is 4.1.  Hemoglobin 12.  Platelet count 126,000.  I will try to get olaparib for her.  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  There have been some reports of olaparib helping with CNS disease.  I do realize that we are in a very difficult situation with respect to this CNS disease.  She really is now that symptomatic from it.  Will get have to get her on some form of anticoagulation for the embolic strokes.  I think that holding the heparin for day is not going to be a bad thing.  We may have to see about getting her on some Lovenox as an outpatient.  Maybe, if we switched over to olaparib, then this will also help with her epistaxis.  I have to give Carla Clark a lot of credit.  She is trying incredibly hard.  She is doing everything that we have asked her to do.  She has great support from her husband and family.  It would be nice if we could get her on the olaparib quickly and see if this may be able to help with her disease.  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  Maybe, ENT can come by today to do another cauterization of the right nares.  Although her vital signs are stable.  Blood pressure 121/79.  Temperature is 97.8.  Pulse 74.  There is really no change in her overall  physical exam.  We have a lot of people working on Carla Clark.  I appreciate everybody's help.  I note that all the staff up on [address] are doing a fantastic job.  Christin Bach, MD  2 Chronicles 7:14

## 2022-05-01 NOTE — Progress Notes (Signed)
PROGRESS NOTE    Carla Clark  QAE:[id number] DOB: Sep 05, 1985 DOA: 05/13/2022 PCP: Bradd Canary, MD    Chief Complaint  Patient presents with   Emesis   Dizziness    Brief Narrative:   Carla Clark is a 37 y.o. female with medical history significant of metastatic breast cancer currently undergoing chemotherapy, Crohn's disease with ileitis s/p left hemicolectomy, SBO, and gerd who presented for headache and emesis.  She had an ED visit at [facility] long couple days ago, where they performed a CT scan of the head which had noted a small focus of cortical hypodensity with effacement of the gray-white matter differentiation involving the right perirolandic frontoparietal cortex at the vertex that was thought to be nonspecific but could be a small cortical infarct.  MRI was not available then, as well patient with recent diagnosis of C. difficile diarrhea, she just finished her oral vancomycin on Tuesday, he is currently on chemotherapy, as her Rande Lawman was recently stopped due to elevated LFTs.  Her workup in ED  MRI of the brain significant for 1.4 focus of enhancement within or along the right postcentral sulcus with adjacent cortical edema, 1.3 focus of enhancement along the medial left parietal lobe with adjacent cortical edema, multiple infarcts noted within the bilateral frontal parietal lobes of various ages thought to be embolic in nature, and prominent enhancement bilateral 7th and 8th cranial nerves within the auditory canals concern for leptomeningeal metastatic disease.  Patient was not a candidate for tPA due to being outside the window.  Patient went for an LP, significant for malignancy.  .   Assessment & Plan:   Principal Problem:   Stroke Active Problems:   Anemia   Epistaxis   Breast cancer metastasized to multiple sites, left   Hyponatremia   Elevated liver enzymes   History of infection of intestine due to Clostridioides difficile   Gastroesophageal reflux disease  without esophagitis   Leptomeningeal disease   Hematemesis with nausea   Elevated LFTs  Multiple CVAs Leptomeningeal spread -MRI brain significant for multiple acute/subacute CVAs . -IV antibiotics has been discontinued given low concern for meningitis now her CSF findings more concerning for meningeal spread. -Unfortunately her CSF significant for malignant cells which is indicative of leptomeningeal carcinomatosis spread. -Neurology and oncology input greatly appreciated, CVA felt to be secondary to her malignancy and hypercoagulable state/embolic source, recommendation for full anticoagulation , pending gtt. has been held today given significant epistaxis.  Marland Kitchen  Epistaxis -ENT input and assistance in management of her epistaxis greatly appreciated, status post bilateral cauterization,  left nares packing. -She is with significant right nosebleeding overnight, heparin drip has been discontinued, she had right nasal packing . -Will start on Augmentin while she is having nasal packing which is expected to last 3 to 5 days.  .  Normocytic anemia -Due to malignancy and chemotherapy, received 2  unit PRBC 4/6 per oncology   Metastatic breast cancer with concern of leptomeningeal spread. -Management per GI Dr. Myna Hidalgo   Hyponatremia Resolved with IV fluids    Elevated liver enzymes Transaminitis  -Thought to be secondary to [facility], started on IV steroids.  Recent history of C. Difficile Patient reported completing her course of oral vancomycin recently.  -Will extend her p.o. vancomycin now she is on Augmentin given her sinus packing -on probiotics - will DC protonix   GERD -Change Protonix to Pepcid given recent C. difficile infection.    DVT prophylaxis: Heparin Drip Code Status: (Full) Family  Communication: discussed with husband at bedside today Disposition:   Status is: Inpatient    Consultants:  Neurology Oncology ENT Palliative   Subjective:  Will have  significant epistaxis from right nares overnight, she had packing by ENT this morning, heparin drip has been held this morning.  Objective: Vitals:   04/30/22 2029 04/30/22 2312 05/01/22 0317 05/01/22 0800  BP: 118/84 120/86 121/79 128/85  Pulse: (!) 104 88 74 93  Resp: 16 16 12 17   Temp: 97.9 F (36.6 C) 97.6 F (36.4 C) 97.8 F (36.6 C) 98.3 F (36.8 C)  TempSrc: Axillary Axillary Axillary Axillary  SpO2: 98% 98% 98% 97%  Weight:      Height:        Intake/Output Summary (Last 24 hours) at 05/01/2022 1003 Last data filed at 04/30/2022 1554 Gross per 24 hour  Intake 658 ml  Output --  Net 658 ml   Filed Weights   05/24/2022 1027  Weight: 51.7 kg    Examination:  Awake Alert, Oriented X 3, frail, ill-appearing, with alopecia due to chemotherapy By lateral nares with packing. Symmetrical Chest wall movement, Good air movement bilaterally, CTAB RRR,No Gallops,Rubs or new Murmurs, No Parasternal Heave +ve B.Sounds, Abd Soft, No tenderness, No rebound - guarding or rigidity. No Cyanosis, Clubbing or edema, No new Rash or bruise        Data Reviewed: I have personally reviewed following labs and imaging studies  CBC: Recent Labs  Lab 04/26/22 2042 05/08/2022 1031 05/12/2022 1826 05/11/2022 2140 04/29/22 0624 04/30/22 0830 04/30/22 1750 05/01/22 0333  WBC 7.3 6.8  --  4.9 3.8* 2.7*  --  4.1  NEUTROABS 5.2 5.6  --   --  3.2 1.2*  --  0.8*  HGB 8.4* 7.2*   < > 8.3* 8.0* 8.3* 13.6 12.0  HCT 26.7* 22.5*   < > 26.1* 24.9* 25.0* 39.4 33.4*  MCV 92.7 91.5  --  92.2 92.2 89.9  --  85.2  PLT 186 164  --  141* 138* 146*  --  126*   < > = values in this interval not displayed.    Basic Metabolic Panel: Recent Labs  Lab 04/26/22 2042 05/08/2022 1031 04/29/22 0624 04/30/22 0220 05/01/22 0333  NA 131* 130* 133* 133* 137  K 3.7 3.8 3.3* 3.9 3.7  CL 107 106 109 109 109  CO2 19* 17* 16* 17* 19*  GLUCOSE 111* 118* 97 121* 114*  BUN 10 6 6  5* 7  CREATININE 0.39* 0.39* 0.36*  0.42* 0.46  CALCIUM 7.8* 7.8* 6.8* 7.6* 7.6*  MG  --  2.0  --   --   --     GFR: Estimated Creatinine Clearance: 80.9 mL/min (by C-G formula based on SCr of 0.46 mg/dL).  Liver Function Tests: Recent Labs  Lab 04/26/22 2042 05/16/2022 1031 04/29/22 0624 04/30/22 0220 05/01/22 0333  AST 227* 252* 312* 271* 178*  ALT 274* 355* 402* 444* 368*  ALKPHOS 801* 984* 887* 1,002* 953*  BILITOT 1.4* 1.6* 1.5* 1.3* 2.0*  PROT 5.8* 6.0* 5.2* 5.7* 6.1*  ALBUMIN 3.0* 3.0* 2.4* 2.7* 2.9*    CBG: No results for input(s): "GLUCAP" in the last 168 hours.   Recent Results (from the past 240 hour(s))  CSF culture w Gram Stain     Status: None (Preliminary result)   Collection Time: 05/14/2022  2:22 PM   Specimen: CSF; Cerebrospinal Fluid  Result Value Ref Range Status   Specimen Description CSF  Final   Special  Requests Immunocompromised  Final   Gram Stain   Final    WBC PRESENT,BOTH PMN AND MONONUCLEAR NO ORGANISMS SEEN CYTOSPIN SMEAR    Culture   Final    NO GROWTH 2 DAYS Performed at [facility], [address] [place], [place] [address]    Report Status PENDING  Incomplete  Culture, blood (single) w Reflex to ID Panel     Status: None (Preliminary result)   Collection Time: 04-30-22  6:23 PM   Specimen: BLOOD  Result Value Ref Range Status   Specimen Description BLOOD SITE NOT SPECIFIED  Final   Special Requests   Final    BOTTLES DRAWN AEROBIC AND ANAEROBIC Blood Culture adequate volume   Culture   Final    NO GROWTH 3 DAYS Performed at [facility], [address] [place], [place] [address]    Report Status PENDING  Incomplete         Radiology Studies: CT ANGIO HEAD NECK W WO CM  Result Date: 04/29/2022 CLINICAL DATA:  Stroke/TIA.  The term in embolic source. EXAM: CT ANGIOGRAPHY HEAD AND NECK TECHNIQUE: Multidetector CT imaging of the head and neck was performed using the standard protocol during bolus administration of intravenous contrast. Multiplanar  CT image reconstructions and MIPs were obtained to evaluate the vascular anatomy. Carotid stenosis measurements (when applicable) are obtained utilizing NASCET criteria, using the distal internal carotid diameter as the denominator. RADIATION DOSE REDUCTION: This exam was performed according to the departmental dose-optimization program which includes automated exposure control, adjustment of the mA and/or kV according to patient size and/or use of iterative reconstruction technique. CONTRAST:  75mL OMNIPAQUE IOHEXOL 350 MG/ML SOLN COMPARISON:  CT and MRI of the brain 30-Apr-2022. FINDINGS: CT HEAD FINDINGS Brain: No acute territorial infarct. Known small scattered subacute infarcts are better appreciated on prior. No evidence of hemorrhage, hydrocephalus, extra-axial collection or mass lesion/mass effect. Vascular: No hyperdense vessel or unexpected calcification. Skull: Negative for fracture. Sinuses/Orbits: No acute finding. Other: None. Review of the MIP images confirms the above findings CTA NECK FINDINGS Aortic arch: Standard branching. Imaged portion shows no evidence of aneurysm or dissection. No significant stenosis of the major arch vessel origins. Right carotid system: No evidence of dissection, stenosis (50% or greater), or occlusion. Left carotid system: No evidence of dissection, stenosis (50% or greater), or occlusion. Vertebral arteries: Codominant. No evidence of dissection, stenosis (50% or greater), or occlusion. Skeleton: Diffuse multifocal lytic lesions within the visualized spine consistent with known metastatic osseous disease. Other neck: Negative. Upper chest: Negative. Review of the MIP images confirms the above findings CTA HEAD FINDINGS Anterior circulation: No significant stenosis, proximal occlusion, aneurysm, or vascular malformation. Posterior circulation: No significant stenosis, proximal occlusion, aneurysm, or vascular malformation. Venous sinuses: As permitted by contrast  timing, patent. Anatomic variants: None significant. Review of the MIP images confirms the above findings IMPRESSION: 1. No acute intracranial abnormality. Known small scattered subacute infarcts are better appreciated on prior MRI. 2. No evidence of dissection, hemodynamically significant stenosis or occlusion of the major neck arteries. 3. No significant stenosis, proximal occlusion, aneurysm, or vascular malformation of the intracranial circulation. 4. Diffuse multifocal lytic lesions within the visualized spine consistent with known metastatic osseous disease. Electronically Signed   By: Baldemar Lenis M.D.   On: 04/29/2022 16:49   VAS [place] LOWER EXTREMITY VENOUS (DVT)  Result Date: 04/29/2022  Lower Venous DVT Study Patient Name:  Carla Clark  Date  of Exam:   04/29/2022 Medical Rec #: [id number]       Accession #:    [id number] Date of Birth: 06/06/1985        Patient Gender: F Patient Age:   [age] years [facility] Procedure:      VAS US LOWER EXTREMITY VENOUS (DVT) Referring Phys: Kipling Graser --------------------------------------------------------------------------------  Indications: Swelling, SOB, and stroke.  Risk Factors: Cancer breast. Comparison Study: No prior study. Performing Technologist: McKayla Maag RVT, [place]  Examination Guidelines: A complete evaluation includes B-mode imaging, spectral Doppler, color Doppler, and power Doppler as needed of all accessible portions of each vessel. Bilateral testing is considered an integral part of a complete examination. Limited examinations for reoccurring indications may be performed as noted. The reflux portion of the exam is performed with the patient in reverse Trendelenburg.  +---------+---------------+---------+-----------+----------+--------------+ RIGHT    CompressibilityPhasicitySpontaneityPropertiesThrombus Aging +---------+---------------+---------+-----------+----------+--------------+ CFV      Full            Yes      Yes                                 +---------+---------------+---------+-----------+----------+--------------+ SFJ      Full                                                        +---------+---------------+---------+-----------+----------+--------------+ FV Prox  Full                                                        +---------+---------------+---------+-----------+----------+--------------+ FV Mid   Full                                                        +---------+---------------+---------+-----------+----------+--------------+ FV DistalFull                                                        +---------+---------------+---------+-----------+----------+--------------+ PFV      Full                                                        +---------+---------------+---------+-----------+----------+--------------+ POP      Full           Yes      Yes                                 +---------+---------------+---------+-----------+----------+--------------+ PTV      Full                                                        +---------+---------------+---------+-----------+----------+--------------+  PERO     Full                                                        +---------+---------------+---------+-----------+----------+--------------+   +---------+---------------+---------+-----------+----------+--------------+ LEFT     CompressibilityPhasicitySpontaneityPropertiesThrombus Aging +---------+---------------+---------+-----------+----------+--------------+ CFV      Full           Yes      Yes                                 +---------+---------------+---------+-----------+----------+--------------+ SFJ      Full                                                        +---------+---------------+---------+-----------+----------+--------------+ FV Prox  Full                                                         +---------+---------------+---------+-----------+----------+--------------+ FV Mid   Full                                                        +---------+---------------+---------+-----------+----------+--------------+ FV DistalFull                                                        +---------+---------------+---------+-----------+----------+--------------+ PFV      Full                                                        +---------+---------------+---------+-----------+----------+--------------+ POP      Full           Yes      Yes                                 +---------+---------------+---------+-----------+----------+--------------+ PTV      Full                                                        +---------+---------------+---------+-----------+----------+--------------+ PERO     Full                                                        +---------+---------------+---------+-----------+----------+--------------+  Summary: BILATERAL: - No evidence of deep vein thrombosis seen in the lower extremities, bilaterally. - No evidence of superficial venous thrombosis in the lower extremities, bilaterally. -No evidence of popliteal cyst, bilaterally.   *See table(s) above for measurements and observations. Electronically signed by Waverly Ferrari MD on 04/29/2022 at 4:06:08 PM.    Final         Scheduled Meds:  Chlorhexidine Gluconate Cloth  6 each Topical Daily   dexamethasone  20 mg Intravenous Q24H   feeding supplement  237 mL Oral TID BM   pantoprazole  40 mg Oral BID   saccharomyces boulardii  250 mg Oral BID   sodium chloride flush  10-40 mL Intracatheter Q12H   vancomycin  125 mg Oral TID AC & HS   Continuous Infusions:     LOS: 3 days      Huey Bienenstock, MD [facility]   To contact the attending provider between 7A-7P or the covering provider during after hours 7P-7A, please log into the web site  [email or web address] and access using universal [place] password for that web site. If you do not have the password, please call the hospital operator.  05/01/2022, 10:03 AM

## 2022-05-02 ENCOUNTER — Inpatient Hospital Stay: Payer: Commercial Managed Care - PPO | Admitting: Hematology & Oncology

## 2022-05-02 ENCOUNTER — Telehealth: Payer: Self-pay

## 2022-05-02 ENCOUNTER — Telehealth: Payer: Self-pay | Admitting: Pharmacist

## 2022-05-02 ENCOUNTER — Inpatient Hospital Stay: Payer: Commercial Managed Care - PPO

## 2022-05-02 ENCOUNTER — Other Ambulatory Visit (HOSPITAL_COMMUNITY): Payer: Self-pay

## 2022-05-02 ENCOUNTER — Encounter: Payer: Self-pay | Admitting: *Deleted

## 2022-05-02 ENCOUNTER — Other Ambulatory Visit: Payer: Self-pay

## 2022-05-02 DIAGNOSIS — Z20828 Contact with and (suspected) exposure to other viral communicable diseases: Secondary | ICD-10-CM

## 2022-05-02 DIAGNOSIS — B259 Cytomegaloviral disease, unspecified: Secondary | ICD-10-CM | POA: Diagnosis not present

## 2022-05-02 DIAGNOSIS — G96198 Other disorders of meninges, not elsewhere classified: Secondary | ICD-10-CM | POA: Diagnosis not present

## 2022-05-02 DIAGNOSIS — R040 Epistaxis: Secondary | ICD-10-CM | POA: Diagnosis not present

## 2022-05-02 DIAGNOSIS — T50905A Adverse effect of unspecified drugs, medicaments and biological substances, initial encounter: Secondary | ICD-10-CM

## 2022-05-02 DIAGNOSIS — R748 Abnormal levels of other serum enzymes: Secondary | ICD-10-CM | POA: Diagnosis not present

## 2022-05-02 DIAGNOSIS — I639 Cerebral infarction, unspecified: Secondary | ICD-10-CM | POA: Diagnosis not present

## 2022-05-02 DIAGNOSIS — C50912 Malignant neoplasm of unspecified site of left female breast: Secondary | ICD-10-CM | POA: Diagnosis not present

## 2022-05-02 DIAGNOSIS — K716 Toxic liver disease with hepatitis, not elsewhere classified: Secondary | ICD-10-CM | POA: Diagnosis not present

## 2022-05-02 LAB — COMPREHENSIVE METABOLIC PANEL
ALT: 331 U/L — ABNORMAL HIGH (ref 0–44)
AST: 148 U/L — ABNORMAL HIGH (ref 15–41)
Albumin: 2.7 g/dL — ABNORMAL LOW (ref 3.5–5.0)
Alkaline Phosphatase: 968 U/L — ABNORMAL HIGH (ref 38–126)
Anion gap: 7 (ref 5–15)
BUN: 10 mg/dL (ref 6–20)
CO2: 18 mmol/L — ABNORMAL LOW (ref 22–32)
Calcium: 7.4 mg/dL — ABNORMAL LOW (ref 8.9–10.3)
Chloride: 109 mmol/L (ref 98–111)
Creatinine, Ser: 0.38 mg/dL — ABNORMAL LOW (ref 0.44–1.00)
GFR, Estimated: >60 mL/min (ref >60)
Glucose, Bld: 103 mg/dL — ABNORMAL HIGH (ref 70–99)
Potassium: 3.9 mmol/L (ref 3.5–5.1)
Sodium: 134 mmol/L — ABNORMAL LOW (ref 135–145)
Total Bilirubin: 1.6 mg/dL — ABNORMAL HIGH (ref 0.3–1.2)
Total Protein: 5.7 g/dL — ABNORMAL LOW (ref 6.5–8.1)

## 2022-05-02 LAB — CSF CULTURE W GRAM STAIN: Culture: NO GROWTH

## 2022-05-02 LAB — CBC WITH DIFFERENTIAL/PLATELET
Abs Immature Granulocytes: 0.09 10*3/uL — ABNORMAL HIGH (ref 0.00–0.07)
Basophils Absolute: 0 10*3/uL (ref 0.0–0.1)
Basophils Relative: 1 %
Eosinophils Absolute: 0 10*3/uL (ref 0.0–0.5)
Eosinophils Relative: 0 %
HCT: 32.9 % — ABNORMAL LOW (ref 36.0–46.0)
Hemoglobin: 11.4 g/dL — ABNORMAL LOW (ref 12.0–15.0)
Immature Granulocytes: 2 %
Lymphocytes Relative: 43 %
Lymphs Abs: 2.3 10*3/uL (ref 0.7–4.0)
MCH: 29.8 pg (ref 26.0–34.0)
MCHC: 34.7 g/dL (ref 30.0–36.0)
MCV: 86.1 fL (ref 80.0–100.0)
Monocytes Absolute: 0.2 10*3/uL (ref 0.1–1.0)
Monocytes Relative: 5 %
Neutro Abs: 2.7 10*3/uL (ref 1.7–7.7)
Neutrophils Relative %: 49 %
Platelets: 112 10*3/uL — ABNORMAL LOW (ref 150–400)
RBC: 3.82 MIL/uL — ABNORMAL LOW (ref 3.87–5.11)
RDW: 21.9 % — ABNORMAL HIGH (ref 11.5–15.5)
WBC: 5.3 10*3/uL (ref 4.0–10.5)
nRBC: 0.9 % — ABNORMAL HIGH (ref 0.0–0.2)

## 2022-05-02 LAB — ANA: Anti Nuclear Antibody (ANA): NEGATIVE

## 2022-05-02 LAB — CMV DNA, QUANTITATIVE, PCR
CMV DNA Quant: POSITIVE IU/mL
Log10 CMV Qn DNA Pl: UNDETERMINED log10 IU/mL

## 2022-05-02 LAB — PROTIME-INR
INR: 1.2 (ref 0.8–1.2)
Prothrombin Time: 14.7 seconds (ref 11.4–15.2)

## 2022-05-02 MED ORDER — POLYETHYLENE GLYCOL 3350 17 G PO PACK: 17.0000 g | PACK | Freq: Every day | ORAL | Status: DC | PRN

## 2022-05-02 MED ORDER — VANCOMYCIN HCL 125 MG PO CAPS
125.0000 mg | ORAL_CAPSULE | Freq: Two times a day (BID) | ORAL | Status: DC
  Administered 2022-05-02 – 2022-05-05 (×4): 125 mg via ORAL
  Filled 2022-05-02 (×9): qty 1

## 2022-05-02 MED ORDER — FAMOTIDINE 20 MG PO TABS
20.0000 mg | ORAL_TABLET | Freq: Two times a day (BID) | ORAL | Status: DC
  Administered 2022-05-02 – 2022-05-03 (×4): 20 mg via ORAL
  Filled 2022-05-02 (×4): qty 1

## 2022-05-02 MED ORDER — BUTALBITAL-APAP-CAFFEINE 50-325-40 MG PO TABS
1.0000 | ORAL_TABLET | Freq: Four times a day (QID) | ORAL | Status: DC | PRN
  Filled 2022-05-02: qty 1

## 2022-05-02 MED ORDER — MAGIC MOUTHWASH
10.0000 mL | Freq: Four times a day (QID) | ORAL | Status: DC
  Administered 2022-05-02 (×4): 10 mL via ORAL
  Filled 2022-05-02 (×6): qty 10

## 2022-05-02 MED ORDER — OLAPARIB 150 MG PO TABS
300.0000 mg | ORAL_TABLET | Freq: Two times a day (BID) | ORAL | 6 refills | Status: DC
Start: 2022-05-02 — End: 2022-05-18

## 2022-05-02 MED ORDER — OXYCODONE HCL 5 MG PO TABS
5.0000 mg | ORAL_TABLET | Freq: Four times a day (QID) | ORAL | Status: DC | PRN
  Administered 2022-05-02 – 2022-05-05 (×3): 5 mg via ORAL
  Filled 2022-05-02 (×3): qty 1

## 2022-05-02 MED ORDER — DEXAMETHASONE 4 MG PO TABS
8.0000 mg | ORAL_TABLET | Freq: Two times a day (BID) | ORAL | Status: DC
  Administered 2022-05-02 – 2022-05-03 (×4): 8 mg via ORAL
  Filled 2022-05-02 (×4): qty 2

## 2022-05-02 NOTE — Progress Notes (Addendum)
This chaplain responded to PMT PA-Josseline consult for notarizing the Pt. Advance Directive:  HCPOA and Living Will. The Pt. is awake and alert at the time of the visit. The Pt. husband-Carla Clark and sister-Carla Clark are at the bedside. The Pt. answered clairifying questions on the role and purpose of both HCPOA and LW.  The chaplain is present with the Pt., family, notary and witnessess for the notarizing of the Pt. AD:  HCPOA and LW.  The Pt. named Carla Clark, Carla Clark. as HCPOA. If the HCPOA is unwilling or unable to serve in this role, the Pt. next choice is [employer]. The chaplain returned the orighinal AD to the Pt. along with two copies. The chaplain scanned the Pt. AD into the Pt. EMR.    This chaplain is available for F/U spiritual care as needed.  ++[phone] This chaplain returned to the Pt. bedside as requested by the Pt. The Pt. is sleeping at the time of the visit. The Pt. husband-Carla Clark is leaving the bedside and the Pt. mother and sister will remain present.  Carla Clark accepted the chaplain's invitation for a spiritual care presence outside the room.  The chaplain listened reflectively as Carla Clark described the emotional journey on the Pt. cancer "path" and their young children. The chaplain understands Carla Clark is supported by his family and is consistently looking for the best way to respond as a husband and a father to the unknown.  Carla Clark extended an invitation to revisit the Pt. when she is awake.  Chaplain Stephanie Acre [phone]

## 2022-05-02 NOTE — Progress Notes (Signed)
Occupational Therapy Treatment Patient Details Name: Carla BridgeSarah B Angelillo MRN: [id number] DOB: 07/06/1985 Today's Date: 05/02/2022   History of present illness Carla Clark is a 37 y.o. female admitted 05/18/2022 with headache and emesis. After CT and MRI imaging revealed Foci within the anterior right frontal lobe white matter, posterior left frontal lobe white matter and left occipital cortex are most compatible with acute infarcts. 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 PMH includes metastatic breast cancer currently undergoing chemotherapy, Crohn's disease with ileitis s/p left hemicolectomy, SBO.   OT comments  Pt progressing gradually towards OT goals w/ pt eager for OOB activity. Pt able to mobilize in hallway at min guard with intermittent support from rail, able to correct x 2 minor LOB. Emphasized energy conservation strategies with ADLs/mobility w/ focus on self monitoring of symptoms/activity response and task modification.   HR up to 132bpm w/ activity, BP WFL.   Recommendations for follow up therapy are one component of a multi-disciplinary discharge planning process, led by the attending physician.  Recommendations may be updated based on patient status, additional functional criteria and insurance authorization.    Assistance Recommended at Discharge PRN  Patient can return home with the following  A little help with bathing/dressing/bathroom;Assistance with cooking/housework   Equipment Recommendations  None recommended by OT    Recommendations for Other Services      Precautions / Restrictions Precautions Precautions: Fall Restrictions Weight Bearing Restrictions: No       Mobility Bed Mobility Overal bed mobility: Modified Independent                  Transfers Overall transfer level: Needs assistance Equipment used: None Transfers: Sit to/from Stand Sit to Stand: Supervision                 Balance Overall balance  assessment: Needs assistance Sitting-balance support: No upper extremity supported, Feet supported Sitting balance-Leahy Scale: Good     Standing balance support: No upper extremity supported, During functional activity Standing balance-Leahy Scale: Fair                             ADL either performed or assessed with clinical judgement   ADL Overall ADL's : Needs assistance/impaired                     Lower Body Dressing: Modified independent;Sitting/lateral leans;Bed level Lower Body Dressing Details (indicate cue type and reason): reaching to don socks bed level easily             Functional mobility during ADLs: Min guard General ADL Comments: Provided energy conservation education w/ handout, discussed shower chair use, performing tasks seated, self monitoring of symptoms and task modificiation for playing with her young children, etc    Extremity/Trunk Assessment Upper Extremity Assessment Upper Extremity Assessment: Generalized weakness   Lower Extremity Assessment Lower Extremity Assessment: Defer to PT evaluation        Vision   Vision Assessment?: No apparent visual deficits   Perception     Praxis      Cognition Arousal/Alertness: Awake/alert Behavior During Therapy: WFL for tasks assessed/performed Overall Cognitive Status: Within Functional Limits for tasks assessed                                          Exercises  Shoulder Instructions       General Comments Family at bedside and supportive    Pertinent Vitals/ Pain       Pain Assessment Pain Assessment: No/denies pain Pain Intervention(s): Monitored during session  Home Living                                          Prior Functioning/Environment              Frequency  Min 2X/week        Progress Toward Goals  OT Goals(current goals can now be found in the care plan section)  Progress towards OT goals:  Progressing toward goals  Acute Rehab OT Goals Patient Stated Goal: regain some strength, go on more walks OT Goal Formulation: With patient Time For Goal Achievement: 05/14/22 Potential to Achieve Goals: Good ADL Goals Pt Will Perform Grooming: with modified independence;standing Pt Will Perform Upper Body Dressing: with modified independence;sitting Pt Will Perform Lower Body Dressing: with modified independence;sit to/from stand Pt Will Transfer to Toilet: with modified independence;ambulating Pt Will Perform Toileting - Clothing Manipulation and hygiene: with modified independence;sit to/from stand Additional ADL Goal #1: Pt will verbalize at least 3 strategies for energy conservation with no cues  Plan Discharge plan remains appropriate    Co-evaluation                 AM-PAC OT "6 Clicks" Daily Activity     Outcome Measure   Help from another person eating meals?: None Help from another person taking care of personal grooming?: None Help from another person toileting, which includes using toliet, bedpan, or urinal?: A Little Help from another person bathing (including washing, rinsing, drying)?: A Little Help from another person to put on and taking off regular upper body clothing?: None Help from another person to put on and taking off regular lower body clothing?: None 6 Click Score: 22    End of Session Equipment Utilized During Treatment: Gait belt  OT Visit Diagnosis: Muscle weakness (generalized) (M62.81);Unsteadiness on feet (R26.81)   Activity Tolerance Patient tolerated treatment well   Patient Left in bed;with call bell/phone within reach;with family/visitor present   Nurse Communication Mobility status        Time: 3570-1779 OT Time Calculation (min): 18 min  Charges: OT General Charges $OT Visit: 1 Visit OT Treatments $Therapeutic Activity: 8-22 mins  Bradd Canary, OTR/L Acute Rehab Services Office: [phone]   Lorre Munroe 05/02/2022,  2:30 PM

## 2022-05-02 NOTE — Progress Notes (Signed)
Mobility Specialist Progress Note   05/02/22 1724  Mobility  Activity Ambulated with assistance in hallway (+ Standing exercises)  Level of Assistance Minimal assist, patient does 75% or more  Assistive Device Other (Comment) (HHA w/ railing)  Distance Ambulated (ft) 60 ft  Activity Response Tolerated well  Mobility Referral Yes  $Mobility charge 1 Mobility   Pre Mobility: 85 HR, 118/76 BP, 100% SpO2 on RA  During Mobility: 116 HR, 94% SpO2 on RA  Post Mobility: 81 HR, 125/91 BP, 98% SpO2 on RA  Received pt in bed c/o fatigue but agreeable. Stand by for bed mobility and to stand and CGA for ambulation in room and hallway. HHA used on railing in the hallway while pt performs standing exercises(high knees, butt kicks, side steps and tandem walking; x4 ea. exercise). Pt having no LOB throughout hallway but limited by fatigue and expressing dizziness, VSS. Returned back to bed w/o fault, call bell placed in reach and all needs were met.    Frederico Hamman Mobility Specialist Please contact via SecureChat or  Rehab office at [phone]

## 2022-05-02 NOTE — Telephone Encounter (Signed)
Script has been e-scribed to [employer] and all supporting documents have been sent to allow prescription to be processed.   Ardeen Fillers, CPhT Oncology Pharmacy Patient Advocate  [facility]  [phone] (phone) [phone] (fax) 05/02/2022 11:09 AM

## 2022-05-02 NOTE — Telephone Encounter (Signed)
Oral Oncology Pharmacist Encounter  Patient's insurance requires that Lynparza (olaparib) be filled through [employer]. Prescription redirected to [facility] for dispensing.   Lenord Carbo, PharmD, BCPS, [facility] Hematology/Oncology Clinical Pharmacist [facility] Oral Chemotherapy Navigation Clinics [phone] 05/02/2022 10:11 AM

## 2022-05-02 NOTE — Telephone Encounter (Signed)
Oral Oncology Pharmacist Encounter  Received new prescription for Lynparza (olaparib) for the treatment of metastatic triple negative breast cancer, BRCA1 positive, with noted new findings of leptomeningeal spread, planned duration until disease progression or unacceptable drug toxicity.  CBC w/ Diff and CMP from 05/02/22 assessed, noted pt with LFT elevation - no baseline dose adjustments required for hepatic impairment with Lynparza. Patient with pltc of 112 K/uL Prescription dose and frequency assessed for appropriateness.  Current medication list in Epic reviewed, no DDIs with [name] identified with patient's inpatient medication list, however noted interaction between Lynparza and Fluconazole (on patient's outpatient list) - if patient is going to resume fluconazole at discharge from hospital while on [name] will need to be dose adjusted to 150 mg BID while on both agents together.   Evaluated chart and no patient barriers to medication adherence noted.   Prescription has been e-scribed to the [facility] for benefits analysis and approval.  Oral Oncology Clinic will continue to follow for insurance authorization, copayment issues, initial counseling and start date.  Lenord Carbo, PharmD, BCPS, BCOP Hematology/Oncology Clinical Pharmacist [facility] Oral Chemotherapy Navigation Clinics [phone] 05/02/2022 9:05 AM

## 2022-05-02 NOTE — Progress Notes (Signed)
There is still a little bit of oozing from the right nares.  It seems to be more serosanguineous.  She is off anticoagulation.  I would keep her off anticoagulation for another day.  She has some slight improvement of her LFTs.  The SGPT is 331 SGOT 148.  Alkaline phosphatase is 968.  Bilirubin is 1.6.  Her white cell count is 5.3.  Hemoglobin 11.4.  Platelet  count 112,000.  She is eating a little bit better.  She slept okay last night.  She had an episode of a headache but she thought this may have been more anxiety related.  She has had no problems with pain.  There is no cough or shortness of breath.  She has had no visual changes.  I was able to order the olaparib yesterday.  This will have to be done as an outpatient.  Hopefully, we will be able to get this to her this week.   Her vital signs are temperature 97.8.  Pulse 79.  Blood pressure 117/79.  Oxygen saturation 99%.  Her head and neck exam shows no ocular or oral lesions.  She has no adenopathy in the neck.  Lungs are clear bilaterally.  She has good air movement bilaterally.  Cardiac exam regular rate and rhythm.  Abdomen is soft.  Bowel sounds are present.  There is no fluid wave.  There is no palpable liver or spleen tip.  Extremity shows no clubbing, cyanosis or edema.  Neurological exam shows no focal neurological deficits.  Carla Clark has metastatic breast cancer.  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  She really has not had that much symptoms from this.  She has been headaches.  She is on Decadron right now.  We will switch this over to oral.  I really appreciate ENTs help.  She has packing in the right nares now.  She is on some antibiotics.  She does need anticoagulation.  However, I would keep her off anticoagulation for another day.  Then, we can see about getting Lovenox started for her.  I do appreciate everybody's help on 5 W.  Everybody really is doing a great job with her.   Christin Bach, MD  Exodus 14:14

## 2022-05-02 NOTE — Progress Notes (Signed)
Daily Progress Note   Patient Name: Carla Clark       Date: 05/02/2022 DOB: 02/02/1985  Age: 37 y.o. MRN#: [id number] Attending Physician: Starleen ArmsElgergawy, Dawood S, MD Primary Care Physician: Bradd CanaryBlyth, Stacey A, MD Admit Date: 05/24/2022  Reason for Consultation/Follow-up: Establishing goals of care  Subjective: Medical records reviewed including progress notes, labs, imaging. Patient assessed at the bedside.  She is sleeping.  Did not attempt to arouse or to preserve comfort.  Her husband Carla Clark and Debby BudSister Carla Clark present visiting.   Carla Clark confirms that Carla SagoSarah was able to complete advanced directives this morning.  She is having a bit of a rough time, now finally sleeping after throwing up.  We agreed it would be best for her to continue resting for now.  I shared that I would be off service for a couple of days and that PMT would be available if needed in the meanwhile.  He is appreciative and states that he will call with any needs.  Questions and concerns addressed. PMT will continue to support holistically.   Length of Stay: 4   Physical Exam Vitals and nursing note reviewed.  Constitutional:      General: She is sleeping. She is not in acute distress. HENT:     Nose:     Right Nostril: Epistaxis present.  Cardiovascular:     Rate and Rhythm: Normal rate.  Pulmonary:     Effort: Pulmonary effort is normal.  Neurological:     Mental Status: She is oriented to person, place, and time.  Psychiatric:        Mood and Affect: Mood normal.        Behavior: Behavior normal.            Vital Signs: BP 128/83 (BP Location: Left Arm)   Pulse 69   Temp 97.7 F (36.5 C) (Axillary)   Resp 16   Ht 5\' 3"  (1.6 m)   Wt 51.7 kg   LMP 02/19/2022   SpO2 99%   BMI 20.19 kg/m  SpO2: SpO2: 99 % O2  Device: O2 Device: Room Air O2 Flow Rate:        Palliative Assessment/Data: 60%   Palliative Care Assessment & Plan   Patient Profile: 37 y.o. female  with past medical history of metastatic breast cancer currently undergoing  chemotherapy, Crohn's disease with ileitis s/p left hemicolectomy, SBO, and gerd admitted on 05/21/2022 with headaches and bloody emesis.    Patient initially presented to [facility] ED then returned due to persistent symptoms. MRI of the brain noted 1.4 focus of enhancement within or along the right postcentral sulcus with adjacent cortical edema, 1.3 focus of enhancement along the medial left parietal lobe with adjacent cortical edema, multiple infarcts noted within the bilateral frontal parietal lobes of various ages thought to be embolic in nature, and prominent enhancement bilateral 7th and 8th cranial nerves within the auditory canals concern for leptomeningeal metastatic disease.   PMT has been consulted to assist with goals of care conversation.  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  Will plan to visit again when I am back on service on 4/11.  Please call team line or secure chat should urgent needs arise in the interim   Prognosis: Poor prognosis with metastatic breast cancer and new leptomeningeal spread   Discharge Planning: To Be Determined  Care plan was discussed with patient's husband, chaplain Carla Clark   Total time: I spent 35 minutes in the care of  the patient today in the above activities and documenting the encounter.   Carla Clark Carla Salles, PA-C  Palliative Medicine Team Team phone # [phone]  Thank you for allowing the Palliative Medicine Team to assist in the care of this patient. Please utilize secure chat with additional questions, if there is no response within 30 minutes please call the above phone number.  Palliative Medicine Team providers are available by phone from 7am to 7pm daily and can be reached through the team cell phone.  Should this patient require assistance outside of these hours, please call the patient's attending physician.

## 2022-05-02 NOTE — Progress Notes (Addendum)
    Progress Note  Chief Complaint: Elevated LFTs   Subjective  Patient reports some vomiting this morning, attributes it to taking too many medicines at one time.  Feels sleepy today.  Tolerating soft diet without difficulty. Mother is in room with patient.   Objective   Vital signs in last 24 hours: Temp:  [97.6 F (36.4 C)-97.8 F (36.6 C)] 97.6 F (36.4 C) (04/08 1616) Pulse Rate:  [69-86] 75 (04/08 1616) Resp:  [12-20] 14 (04/08 1616) BP: (106-130)/(71-95) 106/71 (04/08 1616) SpO2:  [99 %] 99 % (04/08 0556) Last BM Date : 04/28/22 Last BM recorded by nurses in past 5 days No data recorded  General:   female in no acute distress  Heart:  Regular rate and rhythm; no murmurs Pulm: Clear anteriorly; no wheezing Abdomen: soft, nondistended, normal bowel sounds in all quadrants. Nontender without guarding. No organomegaly appreciated. Extremities:  No edema Neurologic:  Alert and  oriented x4;  No focal deficits.  Psych:  Cooperative. Normal mood and affect.  Intake/Output from previous day: No intake/output data recorded. Intake/Output this shift: Total I/O In: 10 [I.V.:10] Out: -   Studies/Results: No results found.  Lab Results: Recent Labs    04/30/22 0830 04/30/22 1750 05/01/22 0333 05/02/22 0240  WBC 2.7*  --  4.1 5.3  HGB 8.3* 13.6 12.0 11.4*  HCT 25.0* 39.4 33.4* 32.9*  PLT 146*  --  126* 112*   BMET Recent Labs    04/30/22 0220 05/01/22 0333 05/02/22 0240  NA 133* 137 134*  K 3.9 3.7 3.9  CL 109 109 109  CO2 17* 19* 18*  GLUCOSE 121* 114* 103*  BUN 5* 7 10  CREATININE 0.42* 0.46 0.38*  CALCIUM 7.6* 7.6* 7.4*   LFT Recent Labs    05/02/22 0240  PROT 5.7*  ALBUMIN 2.7*  AST 148*  ALT 331*  ALKPHOS 968*  BILITOT 1.6*   PT/INR Recent Labs    05/01/22 0333 05/02/22 0240  LABPROT 14.7 14.7  INR 1.2 1.2     Scheduled Meds:  amoxicillin-clavulanate  1 tablet Oral Q12H   Chlorhexidine Gluconate Cloth  6 each Topical Daily    dexamethasone  8 mg Oral Q12H   famotidine  20 mg Oral BID   feeding supplement  237 mL Oral TID BM   magic mouthwash  10 mL Oral QID   saccharomyces boulardii  250 mg Oral BID   sodium chloride flush  10-40 mL Intracatheter Q12H   vancomycin  125 mg Oral BID   Continuous Infusions:    Patient profile:   37 year old female with history of ileocolonic Crohn's disease, recent C. difficile infection (03/2022), metastatic breast cancer presented with headache and nosebleeds admitted with stroke and leptomeningeal spread.  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 bili 1.6.  Slightly improving  Metastatic breast cancer    Plan:   -Continue to trend LFTs -Continue supportive care -Continue steroids   Carla Clark  05/02/2022, 4:28 PM

## 2022-05-02 NOTE — Telephone Encounter (Signed)
Oral Oncology Patient Advocate Encounter  New authorization   Received notification that prior authorization for [name] is required.   PA submitted on 05/02/22  Key [id number]  Status is pending     Ardeen Fillers, CPhT Oncology Pharmacy Patient Advocate  [facility]  [phone] (phone) [phone] (fax) 05/02/2022 8:57 AM

## 2022-05-02 NOTE — Consult Note (Signed)
[facility]                                                                                        Patient Identification: Patient Name: Carla Clark MRN: [id number] Admit Date: 14-May-2022  9:59 AM Today's Date: 05/02/2022 Reason for consult: Positive EBV and CMV serology Requesting provider: Dr. 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 vancomycin 4/5-c P.o. Augmentin 4/7-c  Lines/Hardware: Right chest port  Assessment 37 YO female with PMH as below including Crohn's disease, s/p hemicolectomy, SBO, metastatid breast ca followed by Dr Myna Hidalgo ( previously on [place] stopped due to deranged LFTs, last chemotherapy with Taxol/gemcitabine 4/1 ) who presented to ED with bloody vomiting with headache, dizziness and blurry vision.   Found to have leptomeningeal metastatic spread in MRI.   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  CMV < 200 of unclear significance. She has brief  period of blurry vision during ED visit which since then has resolved with no visual complaints.   # Epistaxis with packing - ENT following and on ? Prophylactic augmentin  # C diff diarrhea - reports completing course of PO Vancomycin 2 days ago.    Recommendations Patient is currently being managed for possible immune mediated hepatitis 2/2 pembrolizumab given timeline. Although she is high risk for CMV disease 2/2 immunosuppression, her VL of less than 200 is of unclear significance. If significant concern for CMV hepatitis, a tissue biopsy/culture would be needed to establish CMV disease and no indication of CMV tx currently CMV IgG, EBV DNA and repeat CMV DNA, CMV DNA quant added on CSF Can complete 5-7 days course of augmentin although no evidence of rhinosinusitis as such.  Will switch PO Vancimycin to 125 mg po bid for ppx while on augmentin  ID available as needed, please call if any above serology comes back positive  Rest of the management as per the primary team. Please call with questions or concerns.  Thank you for the consult  Odette Fraction, MD Infectious Disease Physician [facility] [address] [place], [place] [address] Phone: [phone]  Fax: [phone]  __________________________________________________________________________________________________________ HPI and Hospital Course: 37 YO female with PMH as below including  Crohn's disease, s/p hemicolectomy, SBO, metastatid breast ca followed by Dr Myna Hidalgo ( previously on [place] stopped due to deranged LFTs, last chemotherapy with Taxol/gemcitabine 4/1 ) who presented to ED with bloody vomiting with headache, dizziness and blurry vision.   She was seen at [facility] few days ago, CT scan of the head which had noted a small focus of cortical hypodensity with effacement of the gray-white matter differentiation involving the right perirolandic frontoparietal  cortex at the vertex that was thought to be nonspecific but could be a small cortical infarct.  However, at that time MRI was not available and patient's symptoms seem to improve with treatment for which she was discharged to follow-up in the outpatient setting.  However, symptoms returned and patient had not been able to rest at night with vomiting, nose bleeds.   At ED, afebrile Labs remarkable for deranged LFTs. WBC 6.8 Imaging as below  4/4 status post LP glucose 20, protein 48, WBC 50, N 81%, L 10%, monocyte/macrophage 9%, atypical mononuclear cells present.  CSF Gram stain and culture no growth Also on p.o. vancomycin for C. Difficile as well as Augmentin in the setting of nasal packing for ? Sinusitis.  ID consulted for positive EBV and CMV serology  ROS: General- Denies fever, chills, loss of appetite and loss of weight HEENT - Denies headache, blurry vision, sinus pain/discharge, neck stiffness + Chest - Denies any chest pain, SOB or cough CVS- Denies any dizziness/lightheadedness, syncopal attacks, palpitations Abdomen- Denies any nausea, vomiting, abdominal pain, hematochezia and diarrhea Neuro - Denies any weakness, numbness, tingling sensation Psych - Denies any changes in mood irritability or depressive symptoms GU- Denies any burning, dysuria, hematuria or increased frequency of urination Skin - denies any rashes/lesions MSK - denies any joint pain/swelling or restricted ROM   Past Medical History:  Diagnosis Date   Anal fissure    Anemia 11/2011   iron deficiency. 01/2013 parenteral iron. Intranasal B12.  Dr Myna Hidalgo follows.    B12 deficiency 11/2012   Breast cancer    Breast cancer metastasized to multiple sites, left 02/02/2022   Breast mass in female 04/21/2012   Cervical cancer screening 01/27/2012   Chicken pox as a child   Crohn's disease 2013   ileitis 10/2011. ileitis, mesenteric abscess, ? entero-enteric fistula on 08/2012 CT enterography   Dermatitis  08/22/2016   GERD (gastroesophageal reflux disease)    Hepatitis B immune 07/2012   previous vaccination.    Hives 09/2014   per Dermatologist.  Rx Zyrtec, Benadryl.    Ileitis    Serrated polyp of colon    Small bowel obstruction    SVD (spontaneous vaginal delivery) 08/13/2018   Vitamin D deficiency 2014   Past Surgical History:  Procedure Laterality Date   LAPAROSCOPIC RIGHT HEMI COLECTOMY  07/11/2019   WISDOM TOOTH EXTRACTION  37 yrs old   Scheduled Meds:  amoxicillin-clavulanate  1 tablet Oral Q12H   Chlorhexidine Gluconate Cloth  6 each Topical Daily   dexamethasone  8 mg Oral Q12H   famotidine  20 mg Oral BID   feeding supplement  237 mL Oral TID BM   magic mouthwash  10 mL Oral QID   saccharomyces boulardii  250 mg Oral BID   sodium chloride flush  10-40 mL Intracatheter Q12H   vancomycin  125 mg Oral TID AC & HS   Continuous Infusions: PRN Meds:.bacitracin, butalbital-acetaminophen-caffeine, diphenhydrAMINE **AND** prochlorperazine, HYDROcodone-acetaminophen, lidocaine-prilocaine, LORazepam, melatonin, oxyCODONE, polyethylene glycol, sodium chloride flush  Allergies  Allergen  Reactions   Other Other (See Comments)    History of Crohn's disease- NO seeds, tomatoes, anything that doesn't digest well, etc.....    Social History   Socioeconomic History   Marital status: Married    Spouse name: Not on file   Number of children: 2   Years of education: Not on file   Highest education level: Not on file  Occupational History   Occupation: Magazine features editor: [employer]  Tobacco Use   Smoking status: Never   Smokeless tobacco: Never   Tobacco comments:    never used tobacco  Vaping Use   Vaping Use: Never used  Substance and Sexual Activity   Alcohol use: Not Currently    Alcohol/week: 0.0 standard drinks of alcohol    Comment: occasionaly   Drug use: No   Sexual activity: Yes    Partners: Male    Birth control/protection: None  Other Topics  Concern   Not on file  Social History Narrative   Not on file   Social Determinants of Health   Financial Resource Strain: Medium Risk (01/13/2022)   Overall Financial Resource Strain (CARDIA)    Difficulty of Paying Living Expenses: Somewhat hard  Food Insecurity: No Food Insecurity (01/13/2022)   Hunger Vital Sign    Worried About Running Out of Food in the Last Year: Never true    Ran Out of Food in the Last Year: Never true  Transportation Needs: No Transportation Needs (01/13/2022)   PRAPARE - Administrator, Civil Service (Medical): No    Lack of Transportation (Non-Medical): No  Physical Activity: Not on file  Stress: Not on file  Social Connections: Not on file  Intimate Partner Violence: Not At Risk (01/13/2022)   Humiliation, Afraid, Rape, and Kick questionnaire    Fear of Current or Ex-Partner: No    Emotionally Abused: No    Physically Abused: No    Sexually Abused: No   Family History  Problem Relation Age of Onset   Nephrolithiasis Father    Brain cancer Father    Other Father        anaplastic astrocytoma   Ovarian cancer Paternal Aunt        ovarian   Breast cancer Paternal Aunt    Cancer Paternal Aunt        breast (2019) and ovarian cancer (2005)   Other Paternal Uncle 45       cyst removed from pancreas   Pancreatic cancer Paternal Uncle        Stage 1   Cancer Paternal Uncle        pancreatic   Lung cancer Maternal Grandmother    Gout Maternal Grandfather    Heart attack Maternal Grandfather    Ovarian cancer Paternal Grandmother    Cancer Paternal Grandmother 48       ovarian   Pancreatic cancer Paternal Grandfather    Colon cancer Neg Hx    Stomach cancer Neg Hx    Esophageal cancer Neg Hx     Vitals BP 117/79 (BP Location: Left Arm)   Pulse 79   Temp 97.8 F (36.6 C) (Axillary)   Resp 20   Ht 5\' 3"  (1.6 m)   Wt 51.7 kg   LMP 02/19/2022   SpO2 99%   BMI 20.19 kg/m     Physical Exam Constitutional:  adult female  sitting in the bed and looks tired     Comments:   Cardiovascular:  Rate and Rhythm: Normal rate and regular rhythm.     Heart sounds: s1s2   Pulmonary:     Effort: Pulmonary effort is normal on room air     Comments: Normal breath sounds   Abdominal:     Palpations: Abdomen is soft.     Tenderness: non distended and non tender   Musculoskeletal:        General: No swelling or tenderness in peripheral joints   Skin:    Comments: No rashes   Neurological:     General: awake, alert and oriented, grossly non focal   Psychiatric:        Mood and Affect: Mood normal.    Pertinent Microbiology Results for orders placed or performed during the hospital encounter of 27-May-2022  CSF culture w Gram Stain     Status: None (Preliminary result)   Collection Time: 05-27-22  2:22 PM   Specimen: CSF; Cerebrospinal Fluid  Result Value Ref Range Status   Specimen Description CSF  Final   Special Requests Immunocompromised  Final   Gram Stain   Final    WBC PRESENT,BOTH PMN AND MONONUCLEAR NO ORGANISMS SEEN CYTOSPIN SMEAR    Culture   Final    NO GROWTH 3 DAYS Performed at [facility], [address] [place], [place] [address]    Report Status PENDING  Incomplete  Culture, blood (single) w Reflex to ID Panel     Status: None (Preliminary result)   Collection Time: 05/27/22  6:23 PM   Specimen: BLOOD  Result Value Ref Range Status   Specimen Description BLOOD SITE NOT SPECIFIED  Final   Special Requests   Final    BOTTLES DRAWN AEROBIC AND ANAEROBIC Blood Culture adequate volume   Culture   Final    NO GROWTH 4 DAYS Performed at [facility], [address], [place], [place] [address]    Report Status PENDING  Incomplete    Pertinent Lab seen by me:    Latest Ref Rng & Units 05/02/2022    2:40 AM 05/01/2022    3:33 AM 04/30/2022    5:50 PM  CBC  WBC 4.0 - 10.5 K/uL 5.3  4.1    Hemoglobin 12.0 - 15.0 g/dL 09.8  11.9  14.7   Hematocrit 36.0 - 46.0 % 32.9  33.4   39.4   Platelets 150 - 400 K/uL 112  126        Latest Ref Rng & Units 05/02/2022    2:40 AM 05/01/2022    3:33 AM 04/30/2022    2:20 AM  CMP  Glucose 70 - 99 mg/dL 829  562  130   BUN 6 - 20 mg/dL 10  7  5    Creatinine 0.44 - 1.00 mg/dL 8.65  7.84  6.96   Sodium 135 - 145 mmol/L 134  137  133   Potassium 3.5 - 5.1 mmol/L 3.9  3.7  3.9   Chloride 98 - 111 mmol/L 109  109  109   CO2 22 - 32 mmol/L 18  19  17    Calcium 8.9 - 10.3 mg/dL 7.4  7.6  7.6   Total Protein 6.5 - 8.1 g/dL 5.7  6.1  5.7   Total Bilirubin 0.3 - 1.2 mg/dL 1.6  2.0  1.3   Alkaline Phos 38 - 126 U/L 968  953  1,002   AST 15 - 41 U/L 148  178  271   ALT 0 - 44 U/L  331  368  444     Pertinent Imagings/Other Imagings Plain films and CT images have been personally visualized and interpreted; radiology reports have been reviewed. Decision making incorporated into the Impression / Recommendations.  CT ANGIO HEAD NECK W WO CM  Result Date: 04/29/2022 CLINICAL DATA:  Stroke/TIA.  The term in embolic source. EXAM: CT ANGIOGRAPHY HEAD AND NECK TECHNIQUE: Multidetector CT imaging of the head and neck was performed using the standard protocol during bolus administration of intravenous contrast. Multiplanar CT image reconstructions and MIPs were obtained to evaluate the vascular anatomy. Carotid stenosis measurements (when applicable) are obtained utilizing NASCET criteria, using the distal internal carotid diameter as the denominator. RADIATION DOSE REDUCTION: This exam was performed according to the departmental dose-optimization program which includes automated exposure control, adjustment of the mA and/or kV according to patient size and/or use of iterative reconstruction technique. CONTRAST:  75mL OMNIPAQUE IOHEXOL 350 MG/ML SOLN COMPARISON:  CT and MRI of the brain April 28, 2022. FINDINGS: CT HEAD FINDINGS Brain: No acute territorial infarct. Known small scattered subacute infarcts are better appreciated on prior. No evidence of  hemorrhage, hydrocephalus, extra-axial collection or mass lesion/mass effect. Vascular: No hyperdense vessel or unexpected calcification. Skull: Negative for fracture. Sinuses/Orbits: No acute finding. Other: None. Review of the MIP images confirms the above findings CTA NECK FINDINGS Aortic arch: Standard branching. Imaged portion shows no evidence of aneurysm or dissection. No significant stenosis of the major arch vessel origins. Right carotid system: No evidence of dissection, stenosis (50% or greater), or occlusion. Left carotid system: No evidence of dissection, stenosis (50% or greater), or occlusion. Vertebral arteries: Codominant. No evidence of dissection, stenosis (50% or greater), or occlusion. Skeleton: Diffuse multifocal lytic lesions within the visualized spine consistent with known metastatic osseous disease. Other neck: Negative. Upper chest: Negative. Review of the MIP images confirms the above findings CTA HEAD FINDINGS Anterior circulation: No significant stenosis, proximal occlusion, aneurysm, or vascular malformation. Posterior circulation: No significant stenosis, proximal occlusion, aneurysm, or vascular malformation. Venous sinuses: As permitted by contrast timing, patent. Anatomic variants: None significant. Review of the MIP images confirms the above findings IMPRESSION: 1. No acute intracranial abnormality. Known small scattered subacute infarcts are better appreciated on prior MRI. 2. No evidence of dissection, hemodynamically significant stenosis or occlusion of the major neck arteries. 3. No significant stenosis, proximal occlusion, aneurysm, or vascular malformation of the intracranial circulation. 4. Diffuse multifocal lytic lesions within the visualized spine consistent with known metastatic osseous disease. Electronically Signed   By: Baldemar LenisKatyucia  de Macedo Rodrigues M.D.   On: 04/29/2022 16:49   VAS US LOWER EXTREMITY VENOUS (DVT)  Result Date: 04/29/2022  Lower Venous DVT Study  Patient Name:  Carla BridgeSARAH B Woodside  Date of Exam:   04/29/2022 Medical Rec #: [id number]       Accession #:    [id number] Date of Birth: 09/29/1985        Patient Gender: F Patient Age:   [age] years [facility] Procedure:      VAS US LOWER EXTREMITY VENOUS (DVT) Referring Phys: DAWOOD ELGERGAWY --------------------------------------------------------------------------------  Indications: Swelling, SOB, and stroke.  Risk Factors: Cancer breast. Comparison Study: No prior study. Performing Technologist: McKayla Maag RVT, [place]  Examination Guidelines: A complete evaluation includes B-mode imaging, spectral Doppler, color Doppler, and power Doppler as needed of all accessible portions of each vessel. Bilateral testing is considered an integral part of a complete examination. Limited examinations for reoccurring indications may be performed  as noted. The reflux portion of the exam is performed with the patient in reverse Trendelenburg.  +---------+---------------+---------+-----------+----------+--------------+ RIGHT    CompressibilityPhasicitySpontaneityPropertiesThrombus Aging +---------+---------------+---------+-----------+----------+--------------+ CFV      Full           Yes      Yes                                 +---------+---------------+---------+-----------+----------+--------------+ SFJ      Full                                                        +---------+---------------+---------+-----------+----------+--------------+ FV Prox  Full                                                        +---------+---------------+---------+-----------+----------+--------------+ FV Mid   Full                                                        +---------+---------------+---------+-----------+----------+--------------+ FV DistalFull                                                        +---------+---------------+---------+-----------+----------+--------------+ PFV       Full                                                        +---------+---------------+---------+-----------+----------+--------------+ POP      Full           Yes      Yes                                 +---------+---------------+---------+-----------+----------+--------------+ PTV      Full                                                        +---------+---------------+---------+-----------+----------+--------------+ PERO     Full                                                        +---------+---------------+---------+-----------+----------+--------------+   +---------+---------------+---------+-----------+----------+--------------+ LEFT     CompressibilityPhasicitySpontaneityPropertiesThrombus Aging +---------+---------------+---------+-----------+----------+--------------+ CFV      Full           Yes  Yes                                 +---------+---------------+---------+-----------+----------+--------------+ SFJ      Full                                                        +---------+---------------+---------+-----------+----------+--------------+ FV Prox  Full                                                        +---------+---------------+---------+-----------+----------+--------------+ FV Mid   Full                                                        +---------+---------------+---------+-----------+----------+--------------+ FV DistalFull                                                        +---------+---------------+---------+-----------+----------+--------------+ PFV      Full                                                        +---------+---------------+---------+-----------+----------+--------------+ POP      Full           Yes      Yes                                 +---------+---------------+---------+-----------+----------+--------------+ PTV      Full                                                         +---------+---------------+---------+-----------+----------+--------------+ PERO     Full                                                        +---------+---------------+---------+-----------+----------+--------------+     Summary: BILATERAL: - No evidence of deep vein thrombosis seen in the lower extremities, bilaterally. - No evidence of superficial venous thrombosis in the lower extremities, bilaterally. -No evidence of popliteal cyst, bilaterally.   *See table(s) above for measurements and observations. Electronically signed by Waverly Ferrari MD on 04/29/2022 at 4:06:08 PM.    Final    ECHOCARDIOGRAM COMPLETE  Result Date: 04/29/2022    ECHOCARDIOGRAM REPORT   Patient  Name:   Carla Clark Date of Exam: 04/29/2022 Medical Rec #:  [id number]      Height:       63.0 in Accession #:    [id number]     Weight:       114.0 lb Date of Birth:  12/08/1985       BSA:          1.523 m Patient Age:    36 years       BP:           122/75 mmHg Patient Gender: F              HR:           85 bpm. Exam Location:  Inpatient Procedure: 2D Echo, Cardiac Doppler, Color Doppler, Strain Analysis and 3D Echo Indications:    stroke  History:        Patient has no prior history of Echocardiogram examinations.  Sonographer:    Mike Gip Referring Phys: [phone] DENISE A WOLFE IMPRESSIONS  1. Left ventricular ejection fraction, by estimation, is 65 to 70%. Left ventricular ejection fraction by 3D volume is 65 %. The left ventricle has normal function. The left ventricle has no regional wall motion abnormalities. Left ventricular diastolic  parameters were normal. The average left ventricular global longitudinal strain is -20.6 %. The global longitudinal strain is normal.  2. Right ventricular systolic function is normal. The right ventricular size is normal. There is normal pulmonary artery systolic pressure. The estimated right ventricular systolic pressure is 33.6 mmHg.  3. The mitral valve is normal  in structure. Mild mitral valve regurgitation. No evidence of mitral stenosis.  4. The aortic valve is normal in structure. Aortic valve regurgitation is not visualized. No aortic stenosis is present.  5. The inferior vena cava is dilated in size with >50% respiratory variability, suggesting right atrial pressure of 8 mmHg. FINDINGS  Left Ventricle: Left ventricular ejection fraction, by estimation, is 65 to 70%. Left ventricular ejection fraction by 3D volume is 65 %. The left ventricle has normal function. The left ventricle has no regional wall motion abnormalities. The average left ventricular global longitudinal strain is -20.6 %. The global longitudinal strain is normal. The left ventricular internal cavity size was normal in size. There is no left ventricular hypertrophy. Left ventricular diastolic parameters were normal. Normal left ventricular filling pressure. Right Ventricle: The right ventricular size is normal. No increase in right ventricular wall thickness. Right ventricular systolic function is normal. There is normal pulmonary artery systolic pressure. The tricuspid regurgitant velocity is 2.53 m/s, and  with an assumed right atrial pressure of 8 mmHg, the estimated right ventricular systolic pressure is 33.6 mmHg. Left Atrium: Left atrial size was normal in size. Right Atrium: Right atrial size was normal in size. Pericardium: There is no evidence of pericardial effusion. Mitral Valve: The mitral valve is normal in structure. Mild mitral valve regurgitation. No evidence of mitral valve stenosis. Tricuspid Valve: The tricuspid valve is normal in structure. Tricuspid valve regurgitation is trivial. No evidence of tricuspid stenosis. Aortic Valve: The aortic valve is normal in structure. Aortic valve regurgitation is not visualized. No aortic stenosis is present. Pulmonic Valve: The pulmonic valve was normal in structure. Pulmonic valve regurgitation is trivial. No evidence of pulmonic stenosis.  Aorta: The aortic root is normal in size and structure. Venous: The inferior vena cava is dilated in size with greater than 50% respiratory variability, suggesting right atrial pressure of 8  mmHg. IAS/Shunts: No atrial level shunt detected by color flow Doppler.  LEFT VENTRICLE PLAX 2D LVIDd:         4.80 cm         Diastology LVIDs:         2.70 cm         LV e' medial:    12.50 cm/s LV PW:         0.80 cm         LV E/e' medial:  9.8 LV IVS:        0.80 cm         LV e' lateral:   16.00 cm/s                                LV E/e' lateral: 7.6  LV Volumes (MOD)               2D LV vol d, MOD    130.0 ml      Longitudinal A2C:                           Strain LV vol d, MOD    117.0 ml      2D Strain GLS  -22.6 % A4C:                           (A2C): LV vol s, MOD    37.4 ml       2D Strain GLS  -18.7 % A2C:                           (A3C): LV vol s, MOD    34.1 ml       2D Strain GLS  -25.0 % A4C:                           (A4C): LV SV MOD A2C:   92.6 ml       2D Strain GLS  -20.6 % LV SV MOD A4C:   117.0 ml      Avg: LV SV MOD BP:    87.3 ml                                3D Volume EF                                LV 3D EF:    Left                                             ventricul                                             ar  ejection                                             fraction                                             by 3D                                             volume is                                             65 %.                                 3D Volume EF:                                3D EF:        65 %                                LV EDV:       140 ml                                LV ESV:       50 ml                                LV SV:        91 ml RIGHT VENTRICLE             IVC RV Basal diam:  3.40 cm     IVC diam: 2.40 cm RV S prime:     18.60 cm/s TAPSE (M-mode): 2.4 cm LEFT ATRIUM             Index        RIGHT ATRIUM           Index LA diam:        3.30 cm 2.17 cm/m   RA Area:     9.87 cm LA Vol (A2C):   48.9 ml 32.11 ml/m  RA Volume:   16.90 ml 11.10 ml/m LA Vol (A4C):   38.1 ml 25.02 ml/m LA Biplane Vol: 44.0 ml 28.90 ml/m  AORTIC VALVE LVOT Vmax:   116.00 cm/s LVOT Vmean:  83.400 cm/s LVOT VTI:    0.266 m  AORTA Ao Asc diam: 2.50 cm MITRAL VALVE                  TRICUSPID VALVE MV Area (PHT): 3.13 cm       TR Peak grad:   25.6 mmHg MV Decel Time: 242 msec       TR Vmax:  253.00 cm/s MR Peak grad:    120.6 mmHg MR Mean grad:    81.0 mmHg    SHUNTS MR Vmax:         549.00 cm/s  Systemic VTI: 0.27 m MR Vmean:        428.0 cm/s MR PISA:         0.57 cm MR PISA Eff ROA: 4 mm MR PISA Radius:  0.30 cm MV E velocity: 122.00 cm/s MV A velocity: 95.50 cm/s MV E/A ratio:  1.28 Armanda Magic MD Electronically signed by Armanda Magic MD Signature Date/Time: 04/29/2022/11:12:54 AM    Final    DG FL GUIDED LUMBAR PUNCTURE  Result Date: 2022/05/13 CLINICAL DATA:  Metastatic breast cancer. Concern for leptomeningeal disease. EXAM: LUMBAR PUNCTURE UNDER FLUOROSCOPY PROCEDURE: An appropriate skin entry site was determined fluoroscopically. Operator donned sterile gloves and mask. Skin site was marked, then prepped with Betadine, draped in usual sterile fashion, and infiltrated locally with 1% lidocaine. A 20 gauge spinal needle advanced into the thecal sac at L4-L5 from a right interlaminar approach. Clear colorless CSF spontaneously returned, with opening pressure of 33 cm water. 15 ml CSF were collected and divided among 4 sterile vials for the requested laboratory studies. The needle was then removed. The patient tolerated the procedure well and there were no complications. FLUOROSCOPY: Radiation Exposure Index (as provided by the fluoroscopic device): 0.7 mGy Kerma IMPRESSION: Technically successful lumbar puncture under fluoroscopy. This exam was performed by Loyce Dys, and was supervised and interpreted by Malachi Pro MD.  Electronically Signed   By: Obie Dredge M.D.   On: 05-13-22 15:10   MR Brain W and Wo Contrast  Result Date: 05-13-2022 CLINICAL DATA:  Provided history: Headache, increasing frequency or severity. Additional history obtained from electronic MEDICAL RECORD NUMBERStage IV breast cancer. EXAM: MRI HEAD WITHOUT AND WITH CONTRAST TECHNIQUE: Multiplanar, multiecho pulse sequences of the brain and surrounding structures were obtained without and with intravenous contrast. CONTRAST:  5mL GADAVIST GADOBUTROL 1 MMOL/ML IV SOLN COMPARISON:  Head CT 04/26/2022.  Brain MRI 03/07/2022. FINDINGS: Brain: No age advanced or lobar predominant parenchymal atrophy. 1.4 cm focus of enhancement within or along the right postcentral sulcus with adjacent cortical edema (for instance as seen on series 10, images 41-45) (series 12, image 12) (series 6, image 29). 1.3 cm focus of enhancement within or along the inferomedial left parietal lobe with adjacent cortical edema (for instance as seen on series 10, image 34) (series 12, image 10). Foci of diffusion-weighted and T2 FLAIR hyperintense signal abnormality within the bilateral frontoparietal white matter measuring up to 2.3 cm. There is T2 shine through at the majority of these sites. However, there is a 7 mm focus within the anterior right frontal lobe white matter which demonstrates true restricted diffusion (series 2, image 33) (series 250, image 33), and a 6 mm focus within the posterior left frontal lobe white matter which demonstrates true restricted diffusion. Additionally, there is a 3 mm focus of restricted diffusion and T2 FLAIR hyperintense signal abnormality within the left occipital cortex (series 2, image 30). There is no corresponding enhancement at these sites. These are favored to reflect infarcts, with the foci in the anterior right frontal lobe white matter, posterior left frontal lobe white matter and left occipital cortex being acute, and the remainder subacute to  chronic. Mildly prominent enhancement along the bilateral seventh and eighth cranial nerves within the internal auditory canals (series 10, images 16 and 17). Chronic microhemorrhage within the  right parietal lobe (series 7, image 86). A previously demonstrated right subdural hematoma is no longer present. No extra-axial fluid collection. No midline shift. Vascular: Maintained flow voids within the proximal large arterial vessels. Skull and upper cervical spine: No suspicious marrow lesion. Sinuses/Orbits: No mass or acute finding within the imaged orbits. No significant paranasal sinus disease. IMPRESSION: 1. 1.4 cm focus of enhancement within or along the right postcentral sulcus with adjacent cortical edema. 1.3 cm focus of enhancement within or along the medial left parietal lobe with adjacent cortical edema. While these may reflect subacute infarcts, leptomeningeal metastatic disease cannot be excluded. Correlation with CSF analysis and MR imaging of the spine should be considered. Additionally, a short-interval follow-up brain MRI (without and with contrast) is recommended in 6-8 weeks. 2. Multiple foci of signal abnormality within the bilateral frontoparietal lobes measuring up to 2.3 cm, likely reflecting infarcts of various age. Foci within the anterior right frontal lobe white matter, posterior left frontal lobe white matter and left occipital cortex are most compatible with acute infarcts. The remainder are likely subacute to chronic. Findings are suspicious for an embolic process. 3. Mildly prominent enhancement along the bilateral seventh and eighth cranial nerves within the internal auditory canals. Findings are indeterminate for leptomeningeal metastatic disease versus prominent vascular enhancement. Consider utilizing an internal auditory canal protocol at the time of brain MRI follow-up. 4. Chronic microhemorrhage within the right parietal lobe. 5. Previously demonstrated right subdural hematoma no  longer present. Electronically Signed   By: Jackey Loge D.O.   On: 05/09/2022 12:43   CT Head Wo Contrast  Result Date: 04/26/2022 CLINICAL DATA:  Subdural hematoma, headache EXAM: CT HEAD WITHOUT CONTRAST TECHNIQUE: Contiguous axial images were obtained from the base of the skull through the vertex without intravenous contrast. RADIATION DOSE REDUCTION: This exam was performed according to the departmental dose-optimization program which includes automated exposure control, adjustment of the mA and/or kV according to patient size and/or use of iterative reconstruction technique. COMPARISON:  03/07/2022 FINDINGS: Brain: Normal anatomic configuration of the brain. There is interval development of a small focus of cortical hypodensity with effacement of the gray-white matter differentiation involving the right perirolandic frontoparietal cortex at the vertex best seen on axial image # 23 and sagittal image # 18. This is nonspecific though a small cortical infarct could appear in this fashion. No significant associated abnormal mass effect. No midline shift. No abnormal intra or extra-axial mass lesion or fluid collection. Previously noted right subdural hematoma is not visualized, however, this was not visualized on the prior examination as well; no acute intracranial hemorrhage identified. Ventricular size is normal. Cerebellum is unremarkable. Vascular: Unremarkable Skull: Intact Sinuses/Orbits: Paranasal sinuses are clear. Orbits are unremarkable. Other: Mastoid air cells and middle ear cavities are clear. IMPRESSION: 1. Interval development of a small focus of cortical hypodensity with effacement of the gray-white matter differentiation involving the right perirolandic frontoparietal cortex at the vertex. This is nonspecific though a small cortical infarct could appear in this fashion. Contrast enhanced MRI examination is recommended for further evaluation. 2. Presumed resolution of previously noted subdural  hematoma. No acute intracranial hemorrhage identified. Electronically Signed   By: Helyn Numbers M.D.   On: 04/26/2022 21:16   MR Abdomen W Wo Contrast  Result Date: 04/16/2022 CLINICAL DATA:  Elevated liver function tests. Possible liver lesion on PET-CT. Metastatic breast carcinoma. EXAM: MRI ABDOMEN WITHOUT AND WITH CONTRAST TECHNIQUE: Multiplanar multisequence MR imaging of the abdomen was performed both before and after  the administration of intravenous contrast. CONTRAST:  5.54mL GADAVIST GADOBUTROL 1 MMOL/ML IV SOLN COMPARISON:  PET-CT on 01/21/2022 FINDINGS: Lower chest: No acute findings. Hepatobiliary: No hepatic masses identified. Gallbladder is unremarkable. No evidence of biliary ductal dilatation. Pancreas:  No mass or inflammatory changes. Spleen:  Within normal limits in size and appearance. Adrenals/Urinary Tract: No suspicious masses identified. Right renal scarring noted. No evidence of hydronephrosis. Stomach/Bowel: Unremarkable. Vascular/Lymphatic: No pathologically enlarged lymph nodes identified. No acute vascular findings. Other:  None. Musculoskeletal: Diffuse bone marrow T2 hyperintensity and contrast enhancement is seen, consistent with diffuse bone metastases as demonstrated previously by PET. IMPRESSION: No evidence of liver metastases or other hepatic masses. Diffuse bone metastases again noted. Electronically Signed   By: Danae Orleans M.D.   On: 04/16/2022 18:11   DG Abd 2 Views  Result Date: 04/08/2022 CLINICAL DATA:  Pain EXAM: ABDOMEN - 2 VIEW COMPARISON:  None Available. FINDINGS: Postsurgical changes in the left pelvis. Mottled appearance in the iliac crests and sacrum may represent sclerotic lesions given history of breast cancer. No abnormal fecal loading. No other significant abnormalities. IMPRESSION: 1. Mild sclerotic appearance in the iliac crests and sacrum may represent sclerotic lesions given history of breast cancer. 2. No other significant abnormalities.  Electronically Signed   By: Gerome Sam III M.D.   On: 04/08/2022 18:37     I spent at least 80 minutes for this patient encounter including review of prior medical records/discussing diagnostics and treatment plan with the patient/family/coordinate care with primary/other specialits with greater than 50% of time in face to face encounter.   Electronically signed by:   Odette Fraction, MD Infectious Disease Physician [facility] Infect[facility] Pager: [phone]

## 2022-05-02 NOTE — Telephone Encounter (Signed)
Contacted [facility] to request HER-2 be added to CSF sample from 04/28/2022.  Per Janette, cytology they are unable to add this test to CSF and can only run HER-2 on cell block. Dr Myna Hidalgo aware. dph

## 2022-05-02 NOTE — Progress Notes (Signed)
PROGRESS NOTE    Carla BridgeSarah B Pautler  JXB:[id number] DOB: 03/07/1985 DOA: 05/15/2022 PCP: Bradd CanaryBlyth, Stacey A, MD    Chief Complaint  Patient presents with   Emesis   Dizziness    Brief Narrative:   Carla Clark is a 37 y.o. female with medical history significant of metastatic breast cancer currently undergoing chemotherapy, Crohn's disease with ileitis s/p left hemicolectomy, SBO, and gerd who presented for headache and emesis.  She had [facility] ago, where they performed a CT scan of the head which had noted a small focus of cortical hypodensity with effacement of the gray-white matter differentiation involving the right perirolandic frontoparietal cortex at the vertex that was thought to be nonspecific but could be a small cortical infarct.  MRI was not available then, as well patient with recent diagnosis of C. difficile diarrhea, she just finished her oral vancomycin on Tuesday, he is currently on chemotherapy, as her Rande LawmanKeytruda was recently stopped due to elevated LFTs.  Her workup in ED  MRI of the brain significant for 1.4 focus of enhancement within or along the right postcentral sulcus with adjacent cortical edema, 1.3 focus of enhancement along the medial left parietal lobe with adjacent cortical edema, multiple infarcts noted within the bilateral frontal parietal lobes of various ages thought to be embolic in nature, and prominent enhancement bilateral 7th and 8th cranial nerves within the auditory canals concern for leptomeningeal metastatic disease.  Patient was not a candidate for tPA due to being outside the window.  Patient went for an LP, significant for malignancy.  .   Assessment & Plan:   Principal Problem:   Stroke Active Problems:   Anemia   Epistaxis   Breast cancer metastasized to multiple sites, left   Hyponatremia   Elevated liver enzymes   History of infection of intestine due to Clostridioides difficile   Gastroesophageal reflux disease  without esophagitis   Leptomeningeal disease   Hematemesis with nausea   Elevated LFTs  Multiple CVAs Leptomeningeal spread -MRI brain significant for multiple acute/subacute CVAs . -IV antibiotics has been discontinued given low concern for meningitis now her CSF findings more concerning for meningeal spread. -Unfortunately her CSF significant for malignant cells which is indicative of leptomeningeal carcinomatosis spread. -Neurology and oncology input greatly appreciated, CVA felt to be secondary to her malignancy and hypercoagulable state/embolic source, recommendation for full anticoagulation , on heparin GTT, remains on hold given significant epistaxis.   Epistaxis -ENT input and assistance in management of her epistaxis greatly appreciated, status post bilateral cauterization, currently with bilateral nasal packing, initially with left nasal packing, but required right nasal packing after start bleeding on heparin drip. -  Continue to hold heparin GTT for now -on Augmentin while she is having nasal packing which is expected to last 3 to 5 days.  .  Normocytic anemia -Due to malignancy and chemotherapy, received 2  unit PRBC 4/6 per oncology   Metastatic breast cancer with concern of leptomeningeal spread. -Management per GI Dr. Myna HidalgoEnnever -She is complaining of headache, discussed with her to try Fioricet see if it helps, will try to keep it to the minimum given elevated LFTs, as well she has oxycodone ordered as well.   Hyponatremia Resolved with IV fluids    Elevated liver enzymes Transaminitis  -[facility], started on  steroids.  Recent history of C. Difficile Patient reported completing her course of oral vancomycin recently.  -Will extend her p.o.  vancomycin now she is on Augmentin given her sinus packing -on probiotics - will DC protonix   GERD -Change Protonix to Pepcid given recent C. difficile infection.  CMV/ EBV IgM positive -ID  consulted  She will be started on Magic mouthwash    DVT prophylaxis: Heparin Drip Code Status: (Full) Family Communication: discussed with husband at bedside today Disposition:   Status is: Inpatient    Consultants:  Neurology Oncology ENT Palliative   Subjective:  She is reporting headache this morning, no BM yesterday  Objective: Vitals:   05/01/22 1926 05/01/22 2337 05/02/22 0556 05/02/22 0840  BP: (!) 130/95 124/77 117/79 128/83  Pulse: 86 73 79 69  Resp: 17 12 20 16   Temp: 97.8 F (36.6 C) 97.6 F (36.4 C) 97.8 F (36.6 C) 97.7 F (36.5 C)  TempSrc: Axillary Axillary Axillary Axillary  SpO2: 99% 99% 99%   Weight:      Height:        Intake/Output Summary (Last 24 hours) at 05/02/2022 1259 Last data filed at 05/02/2022 0948 Gross per 24 hour  Intake 10 ml  Output --  Net 10 ml   Filed Weights   05/09/2022 1027  Weight: 51.7 kg    Examination:  Awake Alert, Oriented X 3, frail, ill-appearing, with alopecia due to chemotherapy Symmetrical Chest wall movement, Good air movement bilaterally, CTAB RRR,No Gallops,Rubs or new Murmurs, No Parasternal Heave +ve B.Sounds, Abd Soft, No tenderness, No rebound - guarding or rigidity. No Cyanosis, Clubbing or edema, No new Rash or bruise         Data Reviewed: I have personally reviewed following labs and imaging studies  CBC: Recent Labs  Lab 05/02/2022 1031 05/10/2022 1826 05/07/2022 2140 04/29/22 0624 04/30/22 0830 04/30/22 1750 05/01/22 0333 05/02/22 0240  WBC 6.8  --  4.9 3.8* 2.7*  --  4.1 5.3  NEUTROABS 5.6  --   --  3.2 1.2*  --  0.8* 2.7  HGB 7.2*   < > 8.3* 8.0* 8.3* 13.6 12.0 11.4*  HCT 22.5*   < > 26.1* 24.9* 25.0* 39.4 33.4* 32.9*  MCV 91.5  --  92.2 92.2 89.9  --  85.2 86.1  PLT 164  --  141* 138* 146*  --  126* 112*   < > = values in this interval not displayed.    Basic Metabolic Panel: Recent Labs  Lab 05/20/2022 1031 04/29/22 0624 04/30/22 0220 05/01/22 0333 05/02/22 0240  NA  130* 133* 133* 137 134*  K 3.8 3.3* 3.9 3.7 3.9  CL 106 109 109 109 109  CO2 17* 16* 17* 19* 18*  GLUCOSE 118* 97 121* 114* 103*  BUN 6 6 5* 7 10  CREATININE 0.39* 0.36* 0.42* 0.46 0.38*  CALCIUM 7.8* 6.8* 7.6* 7.6* 7.4*  MG 2.0  --   --   --   --     GFR: Estimated Creatinine Clearance: 80.9 mL/min (A) (by C-G formula based on SCr of 0.38 mg/dL (L)).  Liver Function Tests: Recent Labs  Lab 05/23/2022 1031 04/29/22 0624 04/30/22 0220 05/01/22 0333 05/02/22 0240  AST 252* 312* 271* 178* 148*  ALT 355* 402* 444* 368* 331*  ALKPHOS 984* 887* 1,002* 953* 968*  BILITOT 1.6* 1.5* 1.3* 2.0* 1.6*  PROT 6.0* 5.2* 5.7* 6.1* 5.7*  ALBUMIN 3.0* 2.4* 2.7* 2.9* 2.7*    CBG: No results for input(s): "GLUCAP" in the last 168 hours.   Recent Results (from the past 240 hour(s))  CSF culture w Gram  Stain     Status: None   Collection Time: 05/23/2022  2:22 PM   Specimen: CSF; Cerebrospinal Fluid  Result Value Ref Range Status   Specimen Description CSF  Final   Special Requests Immunocompromised  Final   Gram Stain   Final    WBC PRESENT,BOTH PMN AND MONONUCLEAR NO ORGANISMS SEEN CYTOSPIN SMEAR    Culture   Final    NO GROWTH 3 DAYS Performed at [facility], [address], [place], [place] [address]    Report Status 05/02/2022 FINAL  Final  Culture, blood (single) w Reflex to ID Panel     Status: None (Preliminary result)   Collection Time: 05/03/2022  6:23 PM   Specimen: BLOOD  Result Value Ref Range Status   Specimen Description BLOOD SITE NOT SPECIFIED  Final   Special Requests   Final    BOTTLES DRAWN AEROBIC AND ANAEROBIC Blood Culture adequate volume   Culture   Final    NO GROWTH 4 DAYS Performed at [facility], [address], [place], [place] [address]    Report Status PENDING  Incomplete         Radiology Studies: No results found.      Scheduled Meds:  amoxicillin-clavulanate  1 tablet Oral Q12H   Chlorhexidine Gluconate Cloth  6 each  Topical Daily   dexamethasone  8 mg Oral Q12H   famotidine  20 mg Oral BID   feeding supplement  237 mL Oral TID BM   magic mouthwash  10 mL Oral QID   saccharomyces boulardii  250 mg Oral BID   sodium chloride flush  10-40 mL Intracatheter Q12H   vancomycin  125 mg Oral TID AC & HS   Continuous Infusions:     LOS: 4 days      Huey Bienenstock, MD [facility]   To contact the attending provider between 7A-7P or the covering provider during after hours 7P-7A, please log into the web site [email or web address] and access using universal [place] password for that web site. If you do not have the password, please call the hospital operator.  05/02/2022, 12:59 PM

## 2022-05-02 NOTE — Progress Notes (Signed)
Patient admitted on 4/4 with nosebleeds and severe headache. 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   Will continue to follow for outpatient needs and office follow up.   Oncology Nurse Navigator Documentation     05/02/2022    8:00 AM  Oncology Nurse Navigator Flowsheets  Navigator Follow Up Date: 05/09/2022  Navigator Follow Up Reason: Appointment Review  Navigator Location [facility]  Navigator Encounter Type Appt/Treatment Plan Review  Patient Visit Type MedOnc  Treatment Phase Active Tx  Barriers/Navigation Needs No Barriers At This Time  Interventions None Required  Acuity Level 1-No Barriers  Support Groups/Services Friends and Family  Time Spent with Patient 15

## 2022-05-02 NOTE — Telephone Encounter (Signed)
Oral Oncology Patient Advocate Encounter  Prior Authorization for [name] has been approved.    PA# [id number]  Effective dates: 05/02/22 through 05/02/23  Patient must fill through [facility] per insurance.    Ardeen Fillers, CPhT Oncology Pharmacy Patient Advocate  [facility]  [phone] (phone) [phone] (fax) 05/02/2022 9:51 AM

## 2022-05-03 ENCOUNTER — Other Ambulatory Visit (HOSPITAL_COMMUNITY): Payer: Self-pay

## 2022-05-03 DIAGNOSIS — B259 Cytomegaloviral disease, unspecified: Secondary | ICD-10-CM

## 2022-05-03 DIAGNOSIS — R748 Abnormal levels of other serum enzymes: Secondary | ICD-10-CM | POA: Diagnosis not present

## 2022-05-03 DIAGNOSIS — Z20828 Contact with and (suspected) exposure to other viral communicable diseases: Secondary | ICD-10-CM

## 2022-05-03 DIAGNOSIS — R040 Epistaxis: Secondary | ICD-10-CM | POA: Diagnosis not present

## 2022-05-03 DIAGNOSIS — Z8619 Personal history of other infectious and parasitic diseases: Secondary | ICD-10-CM | POA: Diagnosis not present

## 2022-05-03 DIAGNOSIS — I639 Cerebral infarction, unspecified: Secondary | ICD-10-CM | POA: Diagnosis not present

## 2022-05-03 DIAGNOSIS — D649 Anemia, unspecified: Secondary | ICD-10-CM | POA: Diagnosis not present

## 2022-05-03 DIAGNOSIS — G96198 Other disorders of meninges, not elsewhere classified: Secondary | ICD-10-CM | POA: Diagnosis not present

## 2022-05-03 DIAGNOSIS — K716 Toxic liver disease with hepatitis, not elsewhere classified: Secondary | ICD-10-CM | POA: Diagnosis not present

## 2022-05-03 LAB — CBC WITH DIFFERENTIAL/PLATELET
Abs Immature Granulocytes: 0.12 10*3/uL — ABNORMAL HIGH (ref 0.00–0.07)
Basophils Absolute: 0 10*3/uL (ref 0.0–0.1)
Basophils Relative: 1 %
Eosinophils Absolute: 0 10*3/uL (ref 0.0–0.5)
Eosinophils Relative: 0 %
HCT: 35.5 % — ABNORMAL LOW (ref 36.0–46.0)
Hemoglobin: 11.7 g/dL — ABNORMAL LOW (ref 12.0–15.0)
Immature Granulocytes: 2 %
Lymphocytes Relative: 37 %
Lymphs Abs: 1.9 10*3/uL (ref 0.7–4.0)
MCH: 29.7 pg (ref 26.0–34.0)
MCHC: 33 g/dL (ref 30.0–36.0)
MCV: 90.1 fL (ref 80.0–100.0)
Monocytes Absolute: 0.4 10*3/uL (ref 0.1–1.0)
Monocytes Relative: 7 %
Neutro Abs: 2.6 10*3/uL (ref 1.7–7.7)
Neutrophils Relative %: 53 %
Platelets: 98 10*3/uL — ABNORMAL LOW (ref 150–400)
RBC: 3.94 MIL/uL (ref 3.87–5.11)
RDW: 22.3 % — ABNORMAL HIGH (ref 11.5–15.5)
WBC: 5 10*3/uL (ref 4.0–10.5)
nRBC: 0.8 % — ABNORMAL HIGH (ref 0.0–0.2)

## 2022-05-03 LAB — COMPREHENSIVE METABOLIC PANEL
ALT: 319 U/L — ABNORMAL HIGH (ref 0–44)
AST: 141 U/L — ABNORMAL HIGH (ref 15–41)
Albumin: 2.9 g/dL — ABNORMAL LOW (ref 3.5–5.0)
Alkaline Phosphatase: 1018 U/L — ABNORMAL HIGH (ref 38–126)
Anion gap: 9 (ref 5–15)
BUN: 8 mg/dL (ref 6–20)
CO2: 18 mmol/L — ABNORMAL LOW (ref 22–32)
Calcium: 7.7 mg/dL — ABNORMAL LOW (ref 8.9–10.3)
Chloride: 106 mmol/L (ref 98–111)
Creatinine, Ser: 0.4 mg/dL — ABNORMAL LOW (ref 0.44–1.00)
GFR, Estimated: >60 mL/min (ref >60)
Glucose, Bld: 117 mg/dL — ABNORMAL HIGH (ref 70–99)
Potassium: 4.2 mmol/L (ref 3.5–5.1)
Sodium: 133 mmol/L — ABNORMAL LOW (ref 135–145)
Total Bilirubin: 1.7 mg/dL — ABNORMAL HIGH (ref 0.3–1.2)
Total Protein: 6.1 g/dL — ABNORMAL LOW (ref 6.5–8.1)

## 2022-05-03 LAB — PROTIME-INR
INR: 1 (ref 0.8–1.2)
Prothrombin Time: 13.5 seconds (ref 11.4–15.2)

## 2022-05-03 LAB — ANTI-SMOOTH MUSCLE ANTIBODY, IGG: F-Actin IgG: 6 Units (ref 0–19)

## 2022-05-03 LAB — CULTURE, BLOOD (SINGLE)
Culture: NO GROWTH
Special Requests: ADEQUATE

## 2022-05-03 MED ORDER — SODIUM CHLORIDE 0.9 % IV SOLN: INTRAVENOUS | Status: DC

## 2022-05-03 MED ORDER — LORAZEPAM 2 MG/ML PO CONC
0.5000 mg | Freq: Four times a day (QID) | ORAL | Status: DC | PRN
  Administered 2022-05-03: 0.5 mg via ORAL
  Filled 2022-05-03: qty 1

## 2022-05-03 MED ORDER — DOCUSATE SODIUM 100 MG PO CAPS
100.0000 mg | ORAL_CAPSULE | Freq: Two times a day (BID) | ORAL | Status: DC
  Administered 2022-05-03 – 2022-05-08 (×6): 100 mg via ORAL
  Filled 2022-05-03 (×12): qty 1

## 2022-05-03 MED ORDER — ENOXAPARIN SODIUM 60 MG/0.6ML IJ SOSY
50.0000 mg | PREFILLED_SYRINGE | Freq: Every day | INTRAMUSCULAR | Status: DC
  Administered 2022-05-03: 50 mg via SUBCUTANEOUS
  Filled 2022-05-03 (×2): qty 0.6

## 2022-05-03 NOTE — Progress Notes (Signed)
PROGRESS NOTE    Carla Clark  ZDG:[id number] DOB: [birth year] DOA: 05/24/2022 PCP: Bradd Canary, MD    Chief Complaint  Patient presents with   Emesis   Dizziness    Brief Narrative:   Carla Clark is a 37 y.o. female with medical history significant of metastatic breast cancer currently undergoing chemotherapy, Crohn's disease with ileitis s/p left hemicolectomy, SBO, and gerd who presented for headache and emesis.  She had an ED visit at [facility] long couple days ago, where they performed a CT scan of the head which had noted a small focus of cortical hypodensity with effacement of the gray-white matter differentiation involving the right perirolandic frontoparietal cortex at the vertex that was thought to be nonspecific but could be a small cortical infarct.  MRI was not available then, as well patient with recent diagnosis of C. difficile diarrhea, she just finished her oral vancomycin on Tuesday, he is currently on chemotherapy, as her Rande Lawman was recently stopped due to elevated LFTs.  Her workup in ED  MRI of the brain significant for 1.4 focus of enhancement within or along the right postcentral sulcus with adjacent cortical edema, 1.3 focus of enhancement along the medial left parietal lobe with adjacent cortical edema, multiple infarcts noted within the bilateral frontal parietal lobes of various ages thought to be embolic in nature, and prominent enhancement bilateral 7th and 8th cranial nerves within the auditory canals concern for leptomeningeal metastatic disease.  Patient was not a candidate for tPA due to being outside the window.  Patient went for an LP, significant for malignancy.  Neurology thought her doctor due to hypercoagulable state, oncology as well, she was started on anticoagulation, which has to be discontinued due to recurrent epistaxis, she required by lateral nasal cauterization and packing.   Assessment & Plan:   Principal Problem:   Stroke Active Problems:    Anemia   Epistaxis   Breast cancer metastasized to multiple sites, left   Hyponatremia   Elevated liver enzymes   History of infection of intestine due to Clostridioides difficile   Gastroesophageal reflux disease without esophagitis   Leptomeningeal disease   Hematemesis with nausea   Elevated LFTs   Drug-induced hepatic toxicity   Cytomegalovirus (CMV) viremia   EBV exposure  Multiple CVAs Leptomeningeal spread -MRI brain significant for multiple acute/subacute CVAs . -IV antibiotics has been discontinued given low concern for meningitis now her CSF findings more concerning for meningeal spread. -Unfortunately her CSF significant for malignant cells which is indicative of leptomeningeal carcinomatosis spread. -Neurology and oncology input greatly appreciated, CVA felt to be secondary to her malignancy and hypercoagulable state/embolic source, recommendation for full anticoagulation , gtt. had to be discontinued 4/20 due to recurrent epistaxis, she is being generalized again with Lovenox by oncology .  Epistaxis -ENT input and assistance in management of her epistaxis greatly appreciated, status post bilateral cauterization, currently with bilateral nasal packing, initially with left nasal packing, but required right nasal packing after start bleeding on heparin drip. -on Augmentin while she is having nasal packing -Discussed with Dr. Jearld Fenton, will remove one of the nasal packing tomorrow..  Normocytic anemia -Due to malignancy and chemotherapy, received 2  unit PRBC 4/6 per oncology   Metastatic breast cancer with concern of leptomeningeal spread. -Management per GI Dr. Myna Hidalgo -She is complaining of headache, discussed with her to try Fioricet see if it helps, will try to keep it to the minimum given elevated LFTs, as well she has oxycodone  ordered as well. - plan to treat with Olaprib, per oncology can be started while she is in the hospital if delivered before discharge    Hyponatremia Resolved with IV fluids    Elevated liver enzymes Transaminitis  -Thought to be secondary to [facility], started on  steroids.  Recent history of C. Difficile Patient reported completing her course of oral vancomycin recently.  -Will extend her p.o. vancomycin now she is on Augmentin given her sinus packing -on probiotics - will DC protonix   GERD -Change Protonix to Pepcid given recent C. difficile infection.  CMV/ EBV IgM positive -ID consult greatly appreciated.      DVT prophylaxis: Heparin Drip Code Status: (Full) Family Communication: discussed with husband at bedside today Disposition:   Status is: Inpatient    Consultants:  Neurology Oncology ENT Palliative   Subjective:  No further headache today, still having some very light oozing from the right nares, reports no BM yet.  Objective: Vitals:   05/02/22 1946 05/03/22 0000 05/03/22 0400 05/03/22 0800  BP: 108/80 134/80 113/76 101/78  Pulse: 83 79 86 (!) 111  Resp: Temp: 97.6 F (36.4 C) 97.9 F (36.6 C) 97.7 F (36.5 C) 97.9 F (36.6 C)  TempSrc: Oral Oral Oral Oral  SpO2: 99% 90% 98% 99%  Weight:      Height:        Intake/Output Summary (Last 24 hours) at 05/03/2022 0930 Last data filed at 05/02/2022 0948 Gross per 24 hour  Intake 10 ml  Output --  Net 10 ml   Filed Weights   05/09/2022 1027  Weight: 51.7 kg    Examination:  Awake Alert, Oriented X 3, frail, ill-appearing, with alopecia due to chemotherapy Symmetrical Chest wall movement, Good air movement bilaterally, CTAB RRR,No Gallops,Rubs or new Murmurs, No Parasternal Heave +ve B.Sounds, Abd Soft, No tenderness, No rebound - guarding or rigidity. No Cyanosis, Clubbing or edema, No new Rash or bruise      Data Reviewed: I have personally reviewed following labs and imaging studies  CBC: Recent Labs  Lab 04/29/22 0624 04/30/22 0830 04/30/22 1750 05/01/22 0333 05/02/22 0240 05/03/22 0328  WBC  3.8* 2.7*  --  4.1 5.3 5.0  NEUTROABS 3.2 1.2*  --  0.8* 2.7 2.6  HGB 8.0* 8.3* 13.6 12.0 11.4* 11.7*  HCT 24.9* 25.0* 39.4 33.4* 32.9* 35.5*  MCV 92.2 89.9  --  85.2 86.1 90.1  PLT 138* 146*  --  126* 112* 98*    Basic Metabolic Panel: Recent Labs  Lab 05/01/2022 1031 04/29/22 0624 04/30/22 0220 05/01/22 0333 05/02/22 0240 05/03/22 0328  NA 130* 133* 133* 137 134* 133*  K 3.8 3.3* 3.9 3.7 3.9 4.2  CL 106 109 109 109 109 106  CO2 17* 16* 17* 19* 18* 18*  GLUCOSE 118* 97 121* 114* 103* 117*  BUN 6 6 5* CREATININE 0.39* 0.36* 0.42* 0.46 0.38* 0.40*  CALCIUM 7.8* 6.8* 7.6* 7.6* 7.4* 7.7*  MG 2.0  --   --   --   --   --     GFR: Estimated Creatinine Clearance: 80.9 mL/min (A) (by C-G formula based on SCr of 0.4 mg/dL (L)).  Liver Function Tests: Recent Labs  Lab 04/29/22 0624 04/30/22 0220 05/01/22 0333 05/02/22 0240 05/03/22 0328  AST 312* 271* 178* 148* 141*  ALT 402* 444* 368* 331* 319*  ALKPHOS 887* 1,002* 953* 968* 1,018*  BILITOT 1.5* 1.3* 2.0* 1.6* 1.7*  PROT 5.2* 5.7* 6.1* 5.7* 6.1*  ALBUMIN 2.4* 2.7* 2.9* 2.7* 2.9*    CBG: No results for input(s): "GLUCAP" in the last 168 hours.   Recent Results (from the past 240 hour(s))  CSF culture w Gram Stain     Status: None   Collection Time: 04/25/2022  2:22 PM   Specimen: CSF; Cerebrospinal Fluid  Result Value Ref Range Status   Specimen Description CSF  Final   Special Requests Immunocompromised  Final   Gram Stain   Final    WBC PRESENT,BOTH PMN AND MONONUCLEAR NO ORGANISMS SEEN CYTOSPIN SMEAR    Culture   Final    NO GROWTH 3 DAYS Performed at [facility], [address] [place], [place] [address]    Report Status 05/02/2022 FINAL  Final  Culture, blood (single) w Reflex to ID Panel     Status: None   Collection Time: 05/13/2022  6:23 PM   Specimen: BLOOD  Result Value Ref Range Status   Specimen Description BLOOD SITE NOT SPECIFIED  Final   Special Requests   Final    BOTTLES DRAWN  AEROBIC AND ANAEROBIC Blood Culture adequate volume   Culture   Final    NO GROWTH 5 DAYS Performed at [facility], [address] [place], [place] [address]    Report Status 05/03/2022 FINAL  Final         Radiology Studies: No results found.      Scheduled Meds:  amoxicillin-clavulanate  1 tablet Oral Q12H   Chlorhexidine Gluconate Cloth  6 each Topical Daily   dexamethasone  8 mg Oral Q12H   docusate sodium  100 mg Oral BID   enoxaparin (LOVENOX) injection  50 mg Subcutaneous Daily   famotidine  20 mg Oral BID   feeding supplement  237 mL Oral TID BM   saccharomyces boulardii  250 mg Oral BID   sodium chloride flush  10-40 mL Intracatheter Q12H   vancomycin  125 mg Oral BID   Continuous Infusions:  sodium chloride 50 mL/hr at 05/03/22 0644      LOS: 5 days      Huey Bienenstockawood Aalina Brege, MD [facility]   To contact the attending provider between 7A-7P or the covering provider during after hours 7P-7A, please log into the web site [email or web address] and access using universal [place] password for that web site. If you do not have the password, please call the hospital operator.  05/03/2022, 9:30 AM

## 2022-05-03 NOTE — Progress Notes (Signed)
Brief Palliative Medicine Progress Note:  PMT following peripherally for needs/decline:  Medical records reviewed including progress notes, labs, imaging. No acute changes.  Goals are clear patient wishes to return home, pursue further disease modifying treatments for cancer, and live as long as possible with a good quality of life. Patient is open to hospice/comfort measures when she feels her quality of life is no longer acceptable.   PMT plans for in person follow up 4/11.  PMT will continue to follow peripherally. If there are any imminent needs please call the service directly. Family also has PMT contact information should further needs arise.  Thank you for allowing PMT to assist in the care of this patient.  Tynika Luddy M. Katrinka Blazing [facility] Palliative Medicine Team Team Phone: [phone] NO CHARGE

## 2022-05-03 NOTE — TOC Benefit Eligibility Note (Signed)
Patient Product/process development scientist completed.    The patient is currently admitted and upon discharge could be taking enoxaparin (Lovenox) 60 mg/0.6 ml.  The current 30 day co-pay is $0.00.   The patient is insured through [employer]   This test claim was processed through [employer]- copay amounts may vary at other pharmacies due to pharmacy/plan contracts, or as the patient moves through the different stages of their insurance plan.  Carla Clark, CPHT Pharmacy Patient Advocate Specialist [facility] Patient Advocate Team Direct Number: [phone]  Fax: [phone]

## 2022-05-03 NOTE — Progress Notes (Signed)
So far, they seem to be doing little bit better for [name].  She is still having some slight oozing from the right nares.  She has both nares packed.  Patient a little bit of a headache yesterday.  We really have to get her on some kind anticoagulation for my point of view.  I just worry that she is going to have more embolic strokes.  I will have her on Lovenox.  I would not give her full dose Lovenox.  Will adjust dose or at 1 mg/kg every 24 hours.  I think that we have the Olapirib approved.  We just have to get this sent to her.  Maybe, we can get it started while she is in the hospital.  Her LFTs are about the same.  Her alkaline phosphatase is 1018.  Albumin is 2.9.  SGPT 319 SGOT 141.  Her calcium is 7.7.  Her bilirubin is 1.7.  White cell count is 5.  Hemoglobin 11.7.  Platelet count 98,000.  She has had no fever.  She has not complained of any pain.  There is no cough or shortness of breath.  Hopefully, the olaparib will be able to help [place] out with respect to her systemic disease and her CNS disease.  There are some reports that it does penetrate the CSF.  Her vital signs are temperature 97.7.  Pulse 86.  Blood pressure 113/76.  Her head and neck exam shows no ocular or oral lesions.  There are no lymph nodes in the neck.  Lungs are clear.  Cardiac exam regular rate and rhythm.  Abdomen is soft.  Bowel sounds are present.  There is no fluid wave.  Extremity shows no clubbing, cyanosis or edema.  I think that we can get her off the cardiac monitor.  Hopefully, we will be able to continue to move her along.  Will get have to see about getting the Lovenox approved as an outpatient.  Hopefully, we will be able to get her home in a couple days.  I know that she has had a great staff taking care of her upon 5 W.  Christin Bach, MD  Jeri Modena 29:11

## 2022-05-03 NOTE — Progress Notes (Signed)
Progress Note  Chief Complaint:Elevated LFTs   Subjective  Patient states she continues to feel sleepy today. Has had no episodes of vomiting and tolerating food without difficulty. Denies abdominal pain. Requests liquid ativan for anxiety as she is unable to tolerate tablets at this time.  Patient with family at bedside, mother, husband, other family members. Provided some of the history.     Objective   Vital signs in last 24 hours: Temp:  [97.5 F (36.4 C)-97.9 F (36.6 C)] 97.5 F (36.4 C) (04/09 1246) Pulse Rate:  [75-111] 111 (04/09 0800) Resp:  [13-15] 15 (04/09 0800) BP: (101-134)/(71-82) 122/82 (04/09 1246) SpO2:  [90 %-99 %] 99 % (04/09 0800) Last BM Date : 05/23/2022 Last BM recorded by nurses in past 5 days Stool Type: Type 6 (Mushy consistency with ragged edges) (05/03/2022  9:39 AM)  General:   female in no acute distress  Heart:  Regular rate and rhythm; no murmurs Pulm: Clear anteriorly; no wheezing Abdomen: soft, nondistended, normal bowel sounds in all quadrants. Nontender without guarding. No organomegaly appreciated. Extremities:  No edema Neurologic:  Alert and  oriented x4;  No focal deficits.  Psych:  Cooperative. Normal mood and affect.  Intake/Output from previous day: 04/08 0701 - 04/09 0700 In: 10 [I.V.:10] Out: -  Intake/Output this shift: Total I/O In: 740.6 [P.O.:480; I.V.:260.6] Out: -   Studies/Results: No results found.  Lab Results: Recent Labs    05/01/22 0333 05/02/22 0240 05/03/22 0328  WBC 4.1 5.3 5.0  HGB 12.0 11.4* 11.7*  HCT 33.4* 32.9* 35.5*  PLT 126* 112* 98*   BMET Recent Labs    05/01/22 0333 05/02/22 0240 05/03/22 0328  NA 137 134* 133*  K 3.7 3.9 4.2  CL 109 109 106  CO2 19* 18* 18*  GLUCOSE 114* 103* 117*  BUN 7 10 8   CREATININE 0.46 0.38* 0.40*  CALCIUM 7.6* 7.4* 7.7*   LFT Recent Labs    05/03/22 0328  PROT 6.1*  ALBUMIN 2.9*  AST 141*  ALT 319*  ALKPHOS 1,018*  BILITOT 1.7*    PT/INR Recent Labs    05/02/22 0240 05/03/22 0328  LABPROT 14.7 13.5  INR 1.2 1.0     Scheduled Meds:  amoxicillin-clavulanate  1 tablet Oral Q12H   Chlorhexidine Gluconate Cloth  6 each Topical Daily   dexamethasone  8 mg Oral Q12H   docusate sodium  100 mg Oral BID   enoxaparin (LOVENOX) injection  50 mg Subcutaneous Daily   famotidine  20 mg Oral BID   feeding supplement  237 mL Oral TID BM   saccharomyces boulardii  250 mg Oral BID   sodium chloride flush  10-40 mL Intracatheter Q12H   vancomycin  125 mg Oral BID   Continuous Infusions:  sodium chloride 50 mL/hr at 05/03/22 1200      Patient profile:   37 year old female with history of ileocolonic Crohn's disease, recent C. difficile infection (03/2022), metastatic breast cancer presented with headache and nosebleeds admitted with stroke and leptomeningeal spread. 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  Continue to trend  LFTs. -Autoimmune serologies still pending -CMV and EBV serologies positive, being followed by ID -Continue with p.o. steroids as tolerated and plan to slowly taper over 6 to 8 weeks. -Discussed with hospitalist plan to get patient Ativan solution for anxiety  Beckham Buxbaum Leanna Sato  05/03/2022, 3:25 PM

## 2022-05-03 NOTE — Progress Notes (Signed)
This chaplain is present with the Pt. and family for F/U spiritual care in the space of loving the Pt., each other, and  watchful waiting.  The chaplain understands the Pt. support includes family and community. The chaplain listened reflectively as the Pt. and family began talking about how to bring the Pt. two young children into this period of watchful waiting. The chaplain answered questions and will bring additional education to the Pt. bedside. The chaplain offered an extra level of support for the children from the PMT and unit if a children's visit is planned. The chaplain understands the family's preference is be at home as much as possible.  The family accepted the chaplain's invitation for prayer and continued spiritual care.  Chaplain Stephanie Acre [phone]

## 2022-05-03 NOTE — Plan of Care (Signed)

## 2022-05-03 NOTE — Progress Notes (Signed)
Mobility Specialist - Progress Note   05/03/22 1547  Mobility  Activity Contraindicated/medical hold   Pt currently working with speech therapy. Will follow up as time permits.   Alda Lea  Mobility Specialist Please contact via Special educational needs teacher or Rehab office at [phone]

## 2022-05-03 NOTE — Progress Notes (Signed)
Physical Therapy Treatment Patient Details Name: Carla Clark MRN: [id number] DOB: [birth year] Today's Date: 05/03/2022   History of Present Illness Carla Clark is a 37 y.o. female admitted 04/25/2022 with headache and emesis. After CT and MRI imaging revealed Foci within the anterior right frontal lobe white matter, posterior left frontal lobe white matter and left occipital cortex are most compatible with acute infarcts. 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 PMH includes metastatic breast cancer currently undergoing chemotherapy, Crohn's disease with ileitis s/p left hemicolectomy, SBO.    PT Comments    Pt tolerated today's session well but fatiguing during session. After initial stand pt reported feeling sleepy, no drop in BP noted, pt requesting to return to bed. Pt reports ambulating in the hallway yesterday and feeling fatigued today, educated pt on finding her limits for mobility and slowly progressing forwards as tolerated. Pt transferred to bed with minG and 1HHA due to mild imbalance, returning to supine with modI. Pt continues to have supportive family at the bedside, all questions answered. Pt reporting her goal is to attempt to eat all meals in the chair, provided support of the goal. Also educated pt's husband on ability to put bed in the chair position if she was too fatigued to get to chair at any point. Acute PT will continue to follow pt to progress activity tolerance as able, discharge plans remain appropriate.     Recommendations for follow up therapy are one component of a multi-disciplinary discharge planning process, led by the attending physician.  Recommendations may be updated based on patient status, additional functional criteria and insurance authorization.  Follow Up Recommendations       Assistance Recommended at Discharge Intermittent Supervision/Assistance  Patient can return home with the following A little help with walking and/or  transfers;Help with stairs or ramp for entrance;Assist for transportation   Equipment Recommendations  None recommended by PT    Recommendations for Other Services       Precautions / Restrictions Precautions Precautions: Fall Restrictions Weight Bearing Restrictions: No     Mobility  Bed Mobility Overal bed mobility: Modified Independent Bed Mobility: Sit to Supine       Sit to supine: HOB elevated, Modified independent (Device/Increase time)   General bed mobility comments: modI for return to bed with HOB elevated and line management provided    Transfers Overall transfer level: Needs assistance Equipment used: 1 person hand held assist Transfers: Sit to/from Stand, Bed to chair/wheelchair/BSC Sit to Stand: Supervision   Step pivot transfers: Min guard       General transfer comment: supervision for standing from chair x2 trials, minG for pivot to bed with 1HHA for balance    Ambulation/Gait                   Stairs             Wheelchair Mobility    Modified Rankin (Stroke Patients Only) Modified Rankin (Stroke Patients Only) Pre-Morbid Rankin Score: No symptoms Modified Rankin: Moderate disability     Balance Overall balance assessment: Needs assistance Sitting-balance support: No upper extremity supported, Feet supported Sitting balance-Leahy Scale: Good     Standing balance support: Single extremity supported, During functional activity Standing balance-Leahy Scale: Fair Standing balance comment: mild imbalance and sway with intermittent reaching for support  Cognition Arousal/Alertness: Awake/alert Behavior During Therapy: WFL for tasks assessed/performed Overall Cognitive Status: Within Functional Limits for tasks assessed                                 General Comments: pleasant during session with supportive family, becoming drowsy towards end of session         Exercises      General Comments General comments (skin integrity, edema, etc.): Pt reports feeling fatigued after first standing trial, BP slightly elevated 140s/100s. Family at bedside and supportive throughout      Pertinent Vitals/Pain Pain Assessment Pain Assessment: Faces Faces Pain Scale: Hurts a little bit Pain Location: generalized Pain Descriptors / Indicators: Aching Pain Intervention(s): Monitored during session, Repositioned    Home Living                          Prior Function            PT Goals (current goals can now be found in the care plan section) Acute Rehab PT Goals Patient Stated Goal: get better PT Goal Formulation: With patient/family Time For Goal Achievement: 05/14/22 Potential to Achieve Goals: Good Progress towards PT goals: Progressing toward goals    Frequency    Min 3X/week      PT Plan Current plan remains appropriate    Co-evaluation              AM-PAC PT "6 Clicks" Mobility   Outcome Measure  Help needed turning from your back to your side while in a flat bed without using bedrails?: None Help needed moving from lying on your back to sitting on the side of a flat bed without using bedrails?: None Help needed moving to and from a bed to a chair (including a wheelchair)?: A Little Help needed standing up from a chair using your arms (e.g., wheelchair or bedside chair)?: A Little Help needed to walk in hospital room?: A Little Help needed climbing 3-5 steps with a railing? : A Lot 6 Click Score: 19    End of Session   Activity Tolerance: Patient tolerated treatment well;Patient limited by fatigue Patient left: in bed;with call bell/phone within reach;with family/visitor present;with SCD's reapplied Nurse Communication: Mobility status PT Visit Diagnosis: Unsteadiness on feet (R26.81);Difficulty in walking, not elsewhere classified (R26.2)     Time: [id number] PT Time Calculation (min) (ACUTE ONLY): 15  min  Charges:  $Therapeutic Activity: 8-22 mins                     Lindalou HoseSarah J, PT DPT Acute Rehabilitation Services Office [phone]    Leonie ManSarah S Cherica Heiden 05/03/2022, 3:42 PM

## 2022-05-03 NOTE — Progress Notes (Signed)
SLP Cancellation Note  Patient Details Name: Carla Clark MRN: [id number] DOB: [birth year]   Cancelled assessment: Spoke with pt's husband in the hall re: cognition/speech. He reports no changes in cognition, language, or speech.  No formal assessment will be completed - we agreed there were more important issues at hand at this time. Our service will sign off.        Jaicey Sweaney L. Samson Frederic, MA CCC/SLP Clinical Specialist - Acute Care SLP Acute Rehabilitation Services Office number [phone]  Blenda Mounts Laurice 05/03/2022, 4:11 PM

## 2022-05-03 NOTE — Telephone Encounter (Signed)
Patient's co-pay will be $0.00 for the Lynparza from [facility]. Patient is aware to expect a call from [facility] to set up shipment and delivery of medication (patient was also given phone number to [facility] to call if they have not heard anything by 05/04/22). Patient also knows to call me at [phone] with any questions or concerns regarding receiving medication or if co-pay changes.    Ardeen Fillers, CPhT Oncology Pharmacy Patient Advocate  [facility]  [phone] (phone) [phone] (fax) 05/03/2022 10:31 AM

## 2022-05-04 ENCOUNTER — Inpatient Hospital Stay (HOSPITAL_COMMUNITY): Payer: Commercial Managed Care - PPO

## 2022-05-04 DIAGNOSIS — R748 Abnormal levels of other serum enzymes: Secondary | ICD-10-CM | POA: Diagnosis not present

## 2022-05-04 DIAGNOSIS — I639 Cerebral infarction, unspecified: Secondary | ICD-10-CM | POA: Diagnosis not present

## 2022-05-04 DIAGNOSIS — D649 Anemia, unspecified: Secondary | ICD-10-CM | POA: Diagnosis not present

## 2022-05-04 DIAGNOSIS — K56609 Unspecified intestinal obstruction, unspecified as to partial versus complete obstruction: Secondary | ICD-10-CM | POA: Diagnosis not present

## 2022-05-04 DIAGNOSIS — K716 Toxic liver disease with hepatitis, not elsewhere classified: Secondary | ICD-10-CM | POA: Diagnosis not present

## 2022-05-04 DIAGNOSIS — R040 Epistaxis: Secondary | ICD-10-CM | POA: Diagnosis not present

## 2022-05-04 LAB — CMV ANTIBODY, IGG (EIA): CMV Ab - IgG: 7 U/mL — ABNORMAL HIGH (ref 0.00–0.59)

## 2022-05-04 LAB — BASIC METABOLIC PANEL
Anion gap: 5 (ref 5–15)
BUN: 9 mg/dL (ref 6–20)
CO2: 18 mmol/L — ABNORMAL LOW (ref 22–32)
Calcium: 7.4 mg/dL — ABNORMAL LOW (ref 8.9–10.3)
Chloride: 106 mmol/L (ref 98–111)
Creatinine, Ser: 0.38 mg/dL — ABNORMAL LOW (ref 0.44–1.00)
GFR, Estimated: >60 mL/min (ref >60)
Glucose, Bld: 109 mg/dL — ABNORMAL HIGH (ref 70–99)
Potassium: 3.8 mmol/L (ref 3.5–5.1)
Sodium: 129 mmol/L — ABNORMAL LOW (ref 135–145)

## 2022-05-04 LAB — MITOCHONDRIAL ANTIBODIES: Mitochondrial M2 Ab, IgG: <20 Units (ref 0.0–20.0)

## 2022-05-04 LAB — PROTIME-INR
INR: 1.1 (ref 0.8–1.2)
Prothrombin Time: 14.2 seconds (ref 11.4–15.2)

## 2022-05-04 LAB — EPSTEIN BARR VRS(EBV DNA BY PCR): EBV DNA QN by PCR: NEGATIVE IU/mL

## 2022-05-04 LAB — CMV DNA, QUANTITATIVE, PCR
CMV DNA Quant: POSITIVE IU/mL
CMV DNA Quant: POSITIVE IU/mL
Log10 CMV Qn DNA Pl: UNDETERMINED log10 IU/mL
Log10 CMV Qn DNA Pl: UNDETERMINED log10 IU/mL

## 2022-05-04 MED ORDER — METHYLPREDNISOLONE SODIUM SUCC 125 MG IJ SOLR
60.0000 mg | Freq: Two times a day (BID) | INTRAMUSCULAR | Status: DC
  Administered 2022-05-04 – 2022-05-06 (×4): 60 mg via INTRAVENOUS
  Filled 2022-05-04 (×4): qty 2

## 2022-05-04 MED ORDER — OLAPARIB 150 MG PO TABS
300.0000 mg | ORAL_TABLET | Freq: Two times a day (BID) | ORAL | Status: DC
  Filled 2022-05-04 (×2): qty 2

## 2022-05-04 MED ORDER — OLAPARIB 150 MG PO TABS: 300.0000 mg | ORAL_TABLET | Freq: Two times a day (BID) | ORAL | Status: DC

## 2022-05-04 MED ORDER — SODIUM CHLORIDE 0.9 % IV SOLN
8.0000 mg | Freq: Three times a day (TID) | INTRAVENOUS | Status: DC
  Administered 2022-05-04 – 2022-05-06 (×7): 8 mg via INTRAVENOUS
  Filled 2022-05-04 (×9): qty 4

## 2022-05-04 MED ORDER — FAMOTIDINE 20 MG PO TABS
40.0000 mg | ORAL_TABLET | Freq: Two times a day (BID) | ORAL | Status: DC
  Administered 2022-05-05: 40 mg via ORAL
  Filled 2022-05-04 (×2): qty 2

## 2022-05-04 MED ORDER — DEXAMETHASONE 4 MG PO TABS
4.0000 mg | ORAL_TABLET | Freq: Two times a day (BID) | ORAL | Status: DC
  Filled 2022-05-04: qty 1

## 2022-05-04 MED ORDER — LACTATED RINGERS IV SOLN: INTRAVENOUS | Status: DC

## 2022-05-04 NOTE — Progress Notes (Signed)
**Carla Clark De-Identified via Obfuscation** Carla Carla Clark   Subjective: Patient was not rounded on this morning due to stability of the liver disease for which were consulted, but we were contacted this afternoon because she has developed a small bowel obstruction and were asked to weigh in on management, especially given the complexity of her situation regarding her severe epistaxis requiring nasal packing, recent CVA requiring anticoagulation, and history of complicated Crohn's disease now off medications.  Patient states that she developed abrupt onset nausea and vomiting last night, and has continued to have recurrent episodes of emesis.  A CT scan confirmed a small bowel obstruction with transition point in the RLQ with some mesenteric swirling, possibly suggestive internal hernia.    Objective: Vital signs in last 24 hours: Temp:  [98 F (36.7 C)-98.2 F (36.8 C)] 98.2 F (36.8 C) (04/10 1900) Pulse Rate:  [78-82] 78 (04/10 1900) Resp:  [13-16] 16 (04/10 1900) BP: (116-127)/(81-87) 116/81 (04/10 1900) SpO2:  [98 %-99 %] 99 % (04/10 1900) Last BM Date : 05/21/2022 General: Chronically ill Caucasian female, accompanied by spouse at bedside Lungs:  CTA b/l, no w/r/r Heart:  RRR, no m/r/g Abdomen:  Soft, NT, minimally distended, bowel sounds hypoactive and tinkling     Intake/Output from previous day: 04/09 0701 - 04/10 0700 In: 740.6 [P.O.:480; I.V.:260.6] Out: -  Intake/Output this shift: No intake/output data recorded.   Lab Results: Recent Labs    05/02/22 0240 05/03/22 0328  WBC 5.3 5.0  HGB 11.4* 11.7*  PLT 112* 98*  MCV 86.1 90.1   BMET Recent Labs    05/02/22 0240 05/03/22 0328 05/04/22 0420  NA 134* 133* 129*  K 3.9 4.2 3.8  CL 109 106 106  CO2 18* 18* 18*  GLUCOSE 103* 117* 109*  BUN 10 8 9   CREATININE 0.38* 0.40* 0.38*  CALCIUM 7.4* 7.7* 7.4*   LFT Recent Labs    05/02/22 0240 05/03/22 0328  PROT 5.7* 6.1*  ALBUMIN 2.7* 2.9*  AST 148* 141*  ALT 331* 319*   ALKPHOS 968* 1,018*  BILITOT 1.6* 1.7*   PT/INR Recent Labs    05/03/22 0328 05/04/22 0420  INR 1.0 1.1      Imaging/Other results: CT ABDOMEN PELVIS WO CONTRAST  Result Date: 05/04/2022 CLINICAL DATA:  Bowel obstruction suspected. History of breast cancer. EXAM: CT ABDOMEN AND PELVIS WITHOUT CONTRAST TECHNIQUE: Multidetector CT imaging of the abdomen and pelvis was performed following the standard protocol without IV contrast. RADIATION DOSE REDUCTION: This exam was performed according to the departmental dose-optimization program which includes automated exposure control, adjustment of the mA and/or kV according to patient size and/or use of iterative reconstruction technique. COMPARISON:  CT abdomen and pelvis 04/16/2015 FINDINGS: Lower chest: There is a small pericardial effusion. Hepatobiliary: No focal liver abnormality is seen. No gallstones, gallbladder wall thickening, or biliary dilatation. Pancreas: Unremarkable. No pancreatic ductal dilatation or surrounding inflammatory changes. Spleen: Normal in size without focal abnormality. Adrenals/Urinary Tract: Bladder is markedly distended. Kidneys and adrenal glands are within normal limits. Stomach/Bowel: Patient is likely status post right hemicolectomy. There are dilated small bowel loops throughout the right abdomen with air-fluid levels measuring up 2 3.9 cm in diameter. Jejunal loops and stomach are nondilated. Transition point is seen in the right lower quadrant where there is some swirling of the mesentery (axial images 4/37 through 4/46 mild mesenteric edema present involving dilated small bowel loops. No pneumatosis or free air. Vascular/Lymphatic: No significant vascular findings are present. No enlarged abdominal or pelvic  lymph nodes. Reproductive: Uterus and bilateral adnexa are unremarkable. Other: Trace free fluid in the right upper quadrant. No focal abdominal wall hernia. Musculoskeletal: Innumerable lucent and sclerotic  lesions are seen throughout the osseous structures measuring less than 1 cm. Findings are compatible with metastatic disease. IMPRESSION: 1. Small-bowel obstruction with transition point in the right lower quadrant where there is some swirling of the mesentery. Findings may be related to internal hernia, small-bowel volvulus, and/or adhesions. 2. Trace free fluid in the right upper quadrant. 3. Small pericardial effusion. 4. Bladder is markedly distended. 5. Diffuse osseous metastatic disease. These results were called by telephone at the time of interpretation on 05/04/2022 at 3:31 pm to provider [facility] , who verbally acknowledged these results. Electronically Signed   By: Darliss Cheney M.D.   On: 05/04/2022 15:32   DG Abd 2 Views  Result Date: 05/04/2022 CLINICAL DATA:  Vomiting EXAM: ABDOMEN - 2 VIEW COMPARISON:  None Available. FINDINGS: There are dilated loops of small bowel in the right hemiabdomen measuring up to 4 cm. Findings can be seen in the setting of small-bowel obstruction. No pneumoperitoneum. No pneumatosis. No acute osseous abnormality. Lung bases are clear. IMPRESSION: Dilated loops of small bowel in the right hemiabdomen can be seen in the setting of a small-bowel obstruction. Electronically Signed   By: Lorenza Cambridge M.D.   On: 05/04/2022 09:05      Assessment and Plan:  37 year old female with history of ileocolonic Crohn's disease, recent C. 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  She developed a small bowel obstruction today which warrants nasogastric tube decompression.  Dr. Jerral Ralph had extensive discussions with the patient and ENT regarding the best course of action to navigate bowel  decompression while avoiding recurrent excessive epistaxis as well as recurrent stroke.   The decision was made to remove the nasal packing and carefully place NGT after reversal of her lovenox with protamine. Although the etiology of her obstruction is not clear, I do think empirically treating with IV steroids would likely provide more benefit than harm.  The steroids can be rapidly tapered with clinical improvement. In addition solumedrol/prednisone will likely provide better treatment for her ICI hepatotoxicty.  SBO - Agree with starting SoluMedrol 60 mg IV BID today - Defer to Gen Surg for management of NGT  Pembroizumab hepatotoxicity - Switching steroid from decadron to Solumedrol; hopefully will be more effective - Trend LAEs daily      Carla Lucks, MD  05/04/2022, 8:38 PM [facility]

## 2022-05-04 NOTE — Plan of Care (Signed)

## 2022-05-04 NOTE — Telephone Encounter (Signed)
Oral Chemotherapy Pharmacist Encounter  I spoke with patient and patient's husband today for overview of: Lynparza (olaparib) for the treament of metastatic triple negative breast cancer, BRCA1 positive, with noted new findings of leptomeningeal spread, planned duration until disease progression or unacceptable drug toxicity.  Counseled patient on administration, dosing, side effects, monitoring, drug-food interactions, safe handling, storage, and disposal.  Patient will take Lynparza 150mg  tablets, 2 tablets (300mg ) by mouth 2 times daily without regard to food.  Patient counseled that administering with food may increase tolerability.  Patient knows to avoid grapefruit or grapefruit juice while on therapy with Lynparza.  [name] start date: tentatively 05/05/22 AM   Adverse effects include but are not limited to: nausea, vomiting, diarrhea, fatigue, changes in Scr, decreased blood counts. Nausea: Patient has anti-emetic on hand and knows to take it if nausea develops. We discussed that patient may take an anti-emetic 30-60 minutes prior to Lynparza doses to also help with N/V PPX. Diarrhea: Patient will obtain anti diarrheal and alert the office of 4 or more loose stools above baseline.  Reviewed with patient importance of keeping a medication schedule and plan for any missed doses. No barriers to medication adherence identified.  Medication reconciliation performed and medication/allergy list updated.  Discussed drug-drug interaction between [place] and fluconazole - patient stated she is currently not taking this at home, but will let us know if she needs to resume it. She is aware we would need to dose adjust her Lynparza dose if she is on both fluconazole and Lynparza.   Medication is set to delivery to patient's home today from [facility]. Discussed that for medication to be administered while she is inpatient, it will need to be brought to her RN and they can coordinate  with storing it/having it dispensed from the main pharmacy while she is inpatient.   All questions answered.  Ms. Northway voiced understanding and appreciation.   Medication education handout emailed to. Patient knows to call the office with questions or concerns. Oral Chemotherapy Clinic phone number provided to patient.   Lenord Carbo, PharmD, BCPS, BCOP Hematology/Oncology Clinical Pharmacist [facility] Oral Chemotherapy Navigation Clinics [phone] 05/04/2022 10:07 AM

## 2022-05-04 NOTE — Progress Notes (Signed)
Discussed with pharmacy team-confirmed with nursing staff-patient did not get her dose of Lovenox today.  Her last dose of Lovenox was around 9 AM yesterday.  She is not on a therapeutic dose in any event.  Do not think we need to reverse-as it has been > 24 hours since her last dose.

## 2022-05-04 NOTE — Progress Notes (Signed)
Patient has vomited around 4-5 times today-she has abdominal pain that gets better after vomiting. CT abdomen results noted-she unfortunately now has a bowel obstruction  Very complicated/difficult situation-she still has bilateral nasal packings in place-even if these come out-she will be at increased risk for epistaxis if the NG tube is placed.   Have had a long discussion with the patient and spouse at bedside-we have gone over multiple complicated issues-including bilateral nasal packing in place, increased risk of epistaxis with NG tube placement, potentially reversing Lovenox with protamine, possibly not being a great candidate for surgery if bowel obstruction did not resolve with supportive care, possible peritoneal carcinomatosis causing cancer etc.  Different treatment options including NG tube decompression-stopping anticoagulation and monitoring-even hospice care was discussed.  Case also discussed with Dr. Vilinda Boehringer no good solutions-suggested that we reverse Lovenox with protamine-if patient wanted to proceed with supportive care including NG tube placement.  GI consulted at patient's request-she wanted to speak with Dr. Lennie Odor is not on-call but Dr. Tomasa Rand will apparently be by to talk to patient  General surgery consulted as well  Spoke with Dr. Docia Barrier will be by to see if we can remove nasal packing-and he will advise us-which nare will have the least risk for epistaxis.  I have urged patient/spouse to have ongoing discussions among themselves regarding different treatment options etc.

## 2022-05-04 NOTE — Consult Note (Signed)
Carla Clark Jun 16, 1985  [id number].    Requesting MD: Dr. Jeoffrey Massed Chief Complaint/Reason for Consult: small bowel obstruction  HPI:  Carla Clark is a 37 yo female with metastatic triple-negative breast cancer in the setting of a BRCA mutation. She was receiving treatment with chemotherapy and immunotherapy and presented on 4/5 with headaches and vomiting. Head CT and brain MRI showed multiple acute and subacute strokes, as well as leptomeningeal spread of her breast cancer. She was admitted and started on anticoagulation, and subsequently developed epistaxis for which she required nasal packing by ENT. The packing was removed today with no signs of bleeding. Lovenox has been held today. 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 For the last few days she has had vomiting, and intermittent abdominal pain. A KUB today was concerning for dilated small bowel loops, and a CT scan was done which showed a small bowel obstruction with a mesenteric swirl. She has continued to have bowel function, and had a loose BM this evening. At the time of my exam she denies significant abdominal pain.   She has a history of Crohn's disease, for which she underwent a lap-assisted right hemicolectomy and small bowel resection in 2021 at [facility]. This is her only prior abdominal surgery. She reports she had a bowel obstruction from her Crohn's in 2017, which was treated with steroids. She has not had any other obstructive symptoms since her surgery.  ROS: Review of Systems  Constitutional:  Negative for chills and fever.  Respiratory:  Negative for shortness of breath.   Cardiovascular:  Negative for chest pain.  Gastrointestinal:  Positive for abdominal pain, nausea and vomiting. Negative for constipation.    Family History  Problem Relation Age of Onset   Nephrolithiasis Father    Brain cancer Father     Other Father        anaplastic astrocytoma   Ovarian cancer Paternal Aunt        ovarian   Breast cancer Paternal Aunt    Cancer Paternal Aunt        breast (2019) and ovarian cancer (2005)   Other Paternal Uncle 16       cyst removed from pancreas   Pancreatic cancer Paternal Uncle        Stage 1   Cancer Paternal Uncle        pancreatic   Lung cancer Maternal Grandmother    Gout Maternal Grandfather    Heart attack Maternal Grandfather    Ovarian cancer Paternal Grandmother    Cancer Paternal Grandmother 37       ovarian   Pancreatic cancer Paternal Grandfather    Colon cancer Neg Hx    Stomach cancer Neg Hx    Esophageal cancer Neg Hx     Past Medical History:  Diagnosis Date   Anal fissure    Anemia 11/2011   iron deficiency. 01/2013 parenteral iron. Intranasal B12.  Dr Myna Hidalgo follows.    B12 deficiency 11/2012   Breast cancer    Breast cancer metastasized to multiple sites, left 02/02/2022   Breast mass in female 04/21/2012   Cervical cancer screening 01/27/2012   Chicken pox as a child   Crohn's disease 2013   ileitis 10/2011. ileitis, mesenteric abscess, ? entero-enteric fistula on 08/2012 CT enterography   Dermatitis 08/22/2016   GERD (gastroesophageal reflux disease)    Hepatitis B immune 07/2012   previous vaccination.  Hives 09/2014   per Dermatologist.  Rx Zyrtec, Benadryl.    Ileitis    Serrated polyp of colon    Small bowel obstruction    SVD (spontaneous vaginal delivery) 08/13/2018   Vitamin D deficiency 2014    Past Surgical History:  Procedure Laterality Date   LAPAROSCOPIC RIGHT HEMI COLECTOMY  07/11/2019   WISDOM TOOTH EXTRACTION  37 yrs old    Social History:  reports that she has never smoked. She has never used smokeless tobacco. She reports that she does not currently use alcohol. She reports that she does not use drugs.  Allergies:  Allergies  Allergen Reactions   Other Other (See Comments)    History of Crohn's disease- NO  seeds, tomatoes, anything that doesn't digest well, etc.....    Medications Prior to Admission  Medication Sig Dispense Refill   cefdinir (OMNICEF) 300 MG capsule Take 2 capsules (600 mg total) by mouth daily. 14 capsule 0   docusate sodium (COLACE) 100 MG capsule Take 1 capsule (100 mg total) by mouth 2 (two) times daily. (Patient taking differently: Take 100 mg by mouth daily as needed for mild constipation.) 10 capsule 0   hydrocortisone (ANUSOL-HC) 2.5 % rectal cream Place 1 Application rectally 2 (two) times daily. 28 g 1   lidocaine-prilocaine (EMLA) cream Apply 1 Application topically See admin instructions. Apply a dime-size of cream to port-a-cath 1-2 hours prior to access. Cover with [employer].     LORazepam (ATIVAN) 0.5 MG tablet Take 1 tablet (0.5 mg total) by mouth every 6 (six) hours as needed for anxiety. 30 tablet 0   melatonin 3 MG TABS tablet Take 3 mg by mouth at bedtime as needed (For sleep).     ondansetron (ZOFRAN) 8 MG tablet Take 1 tablet (8 mg total) by mouth every 8 (eight) hours as needed for nausea or vomiting. 30 tablet 1   pantoprazole (PROTONIX) 40 MG tablet Take 1 tablet (40 mg total) by mouth 2 (two) times daily. (Patient taking differently: Take 40 mg by mouth daily as needed (For acid reflux).) 60 tablet 1   polyethylene glycol (MIRALAX / GLYCOLAX) 17 g packet Take 17 g by mouth daily as needed for mild constipation. 14 each 0   prochlorperazine (COMPAZINE) 10 MG tablet Take 1 tablet (10 mg total) by mouth every 6 (six) hours as needed for nausea or vomiting. 30 tablet 1   pyridoxine (NEURO-K-250 VITAMIN B6) 250 MG tablet Take 2 tablets (500 mg total) by mouth daily. 60 tablet 6   traMADol (ULTRAM) 50 MG tablet Take 50 mg by mouth every 6 (six) hours as needed (for pain).     vitamin B-12 (CYANOCOBALAMIN) 100 MCG tablet Take 100 mcg by mouth daily.     fluconazole (DIFLUCAN) 100 MG tablet Take 1 tablet (100 mg total) by mouth daily. (Patient not taking: Reported  on 05/21/2022) 30 tablet 1   methylPREDNISolone (MEDROL DOSEPAK) 4 MG TBPK tablet Take as directed (Patient not taking: Reported on 05/20/2022) 1 each 0   predniSONE (DELTASONE) 10 MG tablet Take 8 tablets (80 mg total) by mouth daily with breakfast. (Patient not taking: Reported on 05/12/2022) 120 tablet 1   vancomycin (VANCOCIN) 125 MG capsule Take 1 capsule (125 mg total) by mouth 4 (four) times daily. (Patient not taking: Reported on 05/19/2022) 40 capsule 0     Physical Exam: Vitals:   05/03/22 1900 05/04/22 0900  BP: 119/76 127/87  Pulse: 73 82  Resp: 17 13  Temp:  97.6 F (36.4 C) 98 F (36.7 C)  SpO2: 98% 98%   General: resting comfortably, appears stated age, no apparent distress Neurological: alert and oriented, no focal deficits, cranial nerves grossly in tact HEENT: normocephalic, atraumatic, no scleral icterus CV: extremities warm and well-perfused Respiratory: normal work of breathing on room air, symmetric chest wall expansion Abdomen: mildly distended but very soft and nontender to palpation. No masses or organomegaly. Well-healed laparoscopic surgical scars. Extremities: warm and well-perfused, no deformities, moving all extremities spontaneously Psychiatric: normal mood and affect Skin: warm and dry, no jaundice, no rashes or lesions   Results for orders placed or performed during the hospital encounter of 05-01-22 (from the past 48 hour(s))  CBC with Differential/Platelet     Status: Abnormal   Collection Time: 05/03/22  3:28 AM  Result Value Ref Range   WBC 5.0 4.0 - 10.5 K/uL   RBC 3.94 3.87 - 5.11 MIL/uL   Hemoglobin 11.7 (L) 12.0 - 15.0 g/dL   HCT 40.9 (L) 81.1 - 91.4 %   MCV 90.1 80.0 - 100.0 fL   MCH 29.7 26.0 - 34.0 pg   MCHC 33.0 30.0 - 36.0 g/dL   RDW 78.2 (H) 95.6 - 21.3 %   Platelets 98 (L) 150 - 400 K/uL    Comment: Immature Platelet Fraction may be clinically indicated, consider ordering this additional test [id number] REPEATED TO VERIFY    nRBC  0.8 (H) 0.0 - 0.2 %   Neutrophils Relative % 53 %   Neutro Abs 2.6 1.7 - 7.7 K/uL   Lymphocytes Relative 37 %   Lymphs Abs 1.9 0.7 - 4.0 K/uL   Monocytes Relative 7 %   Monocytes Absolute 0.4 0.1 - 1.0 K/uL   Eosinophils Relative 0 %   Eosinophils Absolute 0.0 0.0 - 0.5 K/uL   Basophils Relative 1 %   Basophils Absolute 0.0 0.0 - 0.1 K/uL   RBC Morphology MORPHOLOGY UNREMARKABLE    Immature Granulocytes 2 %   Abs Immature Granulocytes 0.12 (H) 0.00 - 0.07 K/uL    Comment: Performed at [facility], [address], [place], [place] [address]  Comprehensive metabolic panel     Status: Abnormal   Collection Time: 05/03/22  3:28 AM  Result Value Ref Range   Sodium 133 (L) 135 - 145 mmol/L   Potassium 4.2 3.5 - 5.1 mmol/L   Chloride 106 98 - 111 mmol/L   CO2 18 (L) 22 - 32 mmol/L   Glucose, Bld 117 (H) 70 - 99 mg/dL    Comment: Glucose reference range applies only to samples taken after fasting for at least 8 hours.   BUN 8 6 - 20 mg/dL   Creatinine, Ser 2.84 (L) 0.44 - 1.00 mg/dL   Calcium 7.7 (L) 8.9 - 10.3 mg/dL   Total Protein 6.1 (L) 6.5 - 8.1 g/dL   Albumin 2.9 (L) 3.5 - 5.0 g/dL   AST 132 (H) 15 - 41 U/L   ALT 319 (H) 0 - 44 U/L   Alkaline Phosphatase 1,018 (H) 38 - 126 U/L   Total Bilirubin 1.7 (H) 0.3 - 1.2 mg/dL   GFR, Estimated >44 >01 mL/min    Comment: (NOTE) Calculated using the CKD-EPI Creatinine Equation (2021)    Anion gap 9 5 - 15    Comment: Performed at [facility], [address] [place], [place] [address]  Protime-INR     Status: None   Collection Time: 05/03/22  3:28 AM  Result  Value Ref Range   Prothrombin Time 13.5 11.4 - 15.2 seconds   INR 1.0 0.8 - 1.2    Comment: (NOTE) INR goal varies based on device and disease states. Performed at [facility], [address] [place], [place] [address]   CMV DNA, quantitative, PCR     Status: None   Collection Time: 05/03/22  3:28 AM  Result Value Ref Range   CMV DNA Quant Positive <  200 Negative IU/mL    Comment: (NOTE) CMV DNA detected. The quantitative range of this assay is 200 to 1 million IU/mL. Performed At: [facility] [address] [place], [place] [id number] Jolene SchimkeNagendra Sanjai MD AO:[id number]:[phone]    Log10 CMV Qn DNA Pl UNABLE TO CALCULATE log10 IU/mL    Comment: (NOTE) Unable to calculate result since non-numeric result obtained for component test.   Epstein barr vrs(ebv dna by pcr)     Status: None   Collection Time: 05/03/22  3:28 AM  Result Value Ref Range   EBV DNA QN by PCR Negative Negative IU/mL    Comment: (NOTE) No EBV DNA detected. The linear range of this assay is 35 - 100,000,000 IU/mL Performed At: [facility] [address] [place], [place] [id number] Jolene SchimkeNagendra Sanjai MD LK:[id number]:[phone]   Cmv antibody, IgG (EIA)     Status: Abnormal   Collection Time: 05/03/22  3:28 AM  Result Value Ref Range   CMV Ab - IgG 7.00 (H) 0.00 - 0.59 U/mL    Comment: (NOTE)                               Negative          <0.60                               Equivocal   0.60 - 0.69                               Positive          >0.69 Performed At: [facility] [address] [place], [place] [id number] Jolene SchimkeNagendra Sanjai MD QV:[id number]:[phone]   CMV DNA, quantitative, PCR     Status: None   Collection Time: 05/03/22  3:28 AM  Result Value Ref Range   CMV DNA Quant Positive < 200 Negative IU/mL    Comment: (NOTE) CMV DNA detected. The quantitative range of this assay is 200 to 1 million IU/mL. Performed At: [facility]  [address] [place], [place] [id number] Jolene SchimkeNagendra Sanjai MD YS:[id number]:[phone]    Log10 CMV Qn DNA Pl UNABLE TO CALCULATE log10 IU/mL    Comment: (NOTE) Unable to calculate result since non-numeric result obtained for component test.   Protime-INR     Status: None   Collection Time: 05/04/22  4:20 AM  Result Value Ref Range   Prothrombin Time 14.2 11.4 - 15.2 seconds   INR 1.1 0.8 - 1.2    Comment: (NOTE) INR goal  varies based on device and disease states. Performed at [facility], [address] [place], [place] [address]   Basic metabolic panel     Status: Abnormal   Collection Time: 05/04/22  4:20 AM  Result Value Ref Range   Sodium 129 (L) 135 - 145 mmol/L   Potassium 3.8 3.5 - 5.1 mmol/L  Chloride 106 98 - 111 mmol/L   CO2 18 (L) 22 - 32 mmol/L   Glucose, Bld 109 (H) 70 - 99 mg/dL    Comment: Glucose reference range applies only to samples taken after fasting for at least 8 hours.   BUN 9 6 - 20 mg/dL   Creatinine, Ser 8.33 (L) 0.44 - 1.00 mg/dL   Calcium 7.4 (L) 8.9 - 10.3 mg/dL   GFR, Estimated >38 >32 mL/min    Comment: (NOTE) Calculated using the CKD-EPI Creatinine Equation (2021)    Anion gap 5 5 - 15    Comment: Performed at [facility], [address], [place], [place] [address]   CT ABDOMEN PELVIS WO CONTRAST  Result Date: 05/04/2022 CLINICAL DATA:  Bowel obstruction suspected. History of breast cancer. EXAM: CT ABDOMEN AND PELVIS WITHOUT CONTRAST TECHNIQUE: Multidetector CT imaging of the abdomen and pelvis was performed following the standard protocol without IV contrast. RADIATION DOSE REDUCTION: This exam was performed according to the departmental dose-optimization program which includes automated exposure control, adjustment of the mA and/or kV according to patient size and/or use of iterative reconstruction technique. COMPARISON:  CT abdomen and pelvis 04/16/2015 FINDINGS: Lower chest: There is a small pericardial effusion. Hepatobiliary: No focal liver abnormality is seen. No gallstones, gallbladder wall thickening, or biliary dilatation. Pancreas: Unremarkable. No pancreatic ductal dilatation or surrounding inflammatory changes. Spleen: Normal in size without focal abnormality. Adrenals/Urinary Tract: Bladder is markedly distended. Kidneys and adrenal glands are within normal limits. Stomach/Bowel: Patient is likely status post right hemicolectomy. There are  dilated small bowel loops throughout the right abdomen with air-fluid levels measuring up 2 3.9 cm in diameter. Jejunal loops and stomach are nondilated. Transition point is seen in the right lower quadrant where there is some swirling of the mesentery (axial images 4/37 through 4/46 mild mesenteric edema present involving dilated small bowel loops. No pneumatosis or free air. Vascular/Lymphatic: No significant vascular findings are present. No enlarged abdominal or pelvic lymph nodes. Reproductive: Uterus and bilateral adnexa are unremarkable. Other: Trace free fluid in the right upper quadrant. No focal abdominal wall hernia. Musculoskeletal: Innumerable lucent and sclerotic lesions are seen throughout the osseous structures measuring less than 1 cm. Findings are compatible with metastatic disease. IMPRESSION: 1. Small-bowel obstruction with transition point in the right lower quadrant where there is some swirling of the mesentery. Findings may be related to internal hernia, small-bowel volvulus, and/or adhesions. 2. Trace free fluid in the right upper quadrant. 3. Small pericardial effusion. 4. Bladder is markedly distended. 5. Diffuse osseous metastatic disease. These results were called by telephone at the time of interpretation on 05/04/2022 at 3:31 pm to provider [facility] , who verbally acknowledged these results. Electronically Signed   By: Darliss Cheney M.D.   On: 05/04/2022 15:32   DG Abd 2 Views  Result Date: 05/04/2022 CLINICAL DATA:  Vomiting EXAM: ABDOMEN - 2 VIEW COMPARISON:  None Available. FINDINGS: There are dilated loops of small bowel in the right hemiabdomen measuring up to 4 cm. Findings can be seen in the setting of small-bowel obstruction. No pneumoperitoneum. No pneumatosis. No acute osseous abnormality. Lung bases are clear. IMPRESSION: Dilated loops of small bowel in the right hemiabdomen can be seen in the setting of a small-bowel obstruction. Electronically Signed   By: Lorenza Cambridge M.D.   On: 05/04/2022 09:05      Assessment/Plan This is a 37 yo female with widely metastatic breast cancer, admitted with acute CVA and  now with abdominal pain and vomiting. I personally reviewed her labs, imaging and notes. Her CT scan shows dilated loops of small bowel as well as some swirling of the mesentery, but no pneumatosis or convincing signs of bowel ischemia. It is possible she has an internal hernia given her prior bowel resection, however an adhesive SBO is also a possibility. Given her metastatic breast cancer a malignant obstruction is also possible, but there are no signs of carcinomatosis on her CT scan.  I discussed with her and her husband that a mesenteric swirl is often an indication for diagnostic laparoscopy, however given her complex medical condition, anticoagulation and current steroids, surgery carries increased risks for her. In addition, her abdominal exam is completely benign and she is clinically very stable, thus my suspicion for ischemic bowel is currently very low. Thus I feel it is safe to defer exploratory surgery at this time, and the patient expressed that she would also prefer to avoid invasive surgeries unless absolutely necessary. Recommend medical management of her obstruction for now with NG tube decompression and serial abdominal exams. We will follow, and if she were to clinically worsen surgery can be considered.   Sophronia Simas, MD [facility] Surgery General, Hepatobiliary and Pancreatic Surgery 05/04/22 7:29 PM

## 2022-05-04 NOTE — Progress Notes (Signed)
Patient ID: Carla Clark, female   DOB: 14-Aug-1985, 37 y.o.   MRN: [id number]  Patient needs NGT for bowel obstruction. She has had pack for 5 days on the left. No bleeding from either side. The right was placed on Sunday.  The left was the first side to bleed. She is on blood thinner but plans to stop.   We discussed removal of both packs and she wanted to proceed, both were removed without problem and they were small merocel with no blood on them. She tolerated well.   Call if any further bleeding

## 2022-05-04 NOTE — Progress Notes (Signed)
This chaplain is present for F/U spiritual care with the Pt. and Pt. husband-David.  The chaplain understands the Pt. has spent most of the morning vomiting with [name] trying to make the Pt. as comfortable as possible. The chaplain listened reflectively as the Pt. and [name] expressed their desire for a visit/communication today from ENT about the nare packing. The chaplain updated the PMT NP-Amber.  The chaplain provided educational material on grief care for children with the invitation to revisit when the time is appropriate.  The Pt. began vomiting and the chaplain assisted with a cool washcloth before exiting the room.  This chaplain is available for F/U spiritual care as needed.  Chaplain Stephanie Acre [phone]

## 2022-05-04 NOTE — Progress Notes (Signed)
The issue right now is that she is having some nausea and vomiting.  I do not know if this might be from the oral vancomycin that she is taking.  I do not know if this might be from the Crohn's.  I will have her on some scheduled Zofran to see if this may help.  It looks like the nasal packings have helped with the epistaxis.  She did not have any bleeding from the nose.  She is on Lovenox right now.  I have her on a lower dose of Lovenox to try to help with the embolic strokes.  I do not see any lab work back yet today.  She has had no fever.  Only check a urine on her to make sure there is no urinary tract infection that might be causing the nausea and vomiting.  There is no diarrhea.  She actually ate pretty well yesterday.  She hopefully will be able to start the olaparib either today or tomorrow.  It sounds like will be shipped out today.  I know that she has been followed by multiple services.  I appreciate physical therapy.  I appreciate palliative care.  She also being seen by speech pathology.  It would really be nice to try to get her home soon.  Again, this vomiting might be an issue.  We will have to see what the urine shows.  I will know if the oral vancomycin can be stopped.  She thinks that she may be having the vomiting because she takes so many medications.  She is on antibiotics for the prevention of sinusitis while having the packings in.  Maybe, ENT will be able to see her today to try to see if the packing can be taken out.  Her vital signs are temperature 97.6.  Pulse 73.  Blood pressure 119/76.  Oxygen saturation 98%.  Her lungs are clear bilaterally.  Cardiac exam regular rate and rhythm.  She has no murmurs.  Abdomen is soft.  Bowel sounds are decreased.  There is no fluid wave.  There is no guarding or rebound tenderness.  I cannot palpate her liver.  Extremity shows no clubbing, cyanosis or edema.  Ms. Carlston has a metastatic breast cancer.  This is triple  negative.  We are trying to get her on olaparib.  This does have some CSF penetration.  Hopefully, she will start this today or tomorrow.  I told she and her husband that I would only start it if the vomiting is under control.  Again I do appreciate everybody's help with her.  I know that this is quite complicated.   Christin Bach, MD  Psalms 6:2

## 2022-05-04 NOTE — Progress Notes (Signed)
Evaluated at bedside with Dr's Jearld Fenton (ENT) and Dr Tomasa Rand (GI) Both nasal packings removed by Dr Jearld Fenton. Per my d/w Dr Jearld Fenton placing a NGT with reversing Lovenox with Protamine would have the least risk of recurrence of Epistaxis. Per my prior conversation with Dr Dwaine Gale was what he had recommended as well. Patient, spouse all aware of risks of VTE, more strokes off anticoagulation-and are accepting the risks.  Have ordered Protamine per pharmacy consult, talked with pharmacist, they will look into how soon protamine reverses Lovenox, and we will order a NGT accordingly.  Bedside RN updated.

## 2022-05-04 NOTE — Progress Notes (Signed)
PROGRESS NOTE        PATIENT DETAILS Name: Carla Clark Age: 37 y.o. Sex: female Date of Birth: 22-Jun-1985 Admit Date: 05/22/2022 Admitting Physician Clydie Braun, MD CZY:SAYTK, Bryon Lions, MD  Brief Summary: Patient is a 37 y.o.  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  Hospital course was complicated by epistaxis requiring ENT evaluation and nasal packing.  See below for further details.  Significant events: 4/4>> admit to [facility]  Significant studies: 4/4>> MRI brain: Possible leptomeningeal metastatic disease, multiple embolic infarcts. 4/5>> CT angio head/neck: No significant stenosis 4/5>> echo: EF 65-70% 4/10>> x-ray abdomen: Dilated loops of small bowel in the right hemiabdomen.  Significant microbiology data: 4/4>> blood culture: No growth 4/4>> CSF culture: No growth  Procedures: 4/4>> fluoroscopy guided lumbar puncture  Consults: ENT Palliative Hematology/oncology Neurology Infection disease  Subjective: Lying comfortably in bed-denies any chest pain or shortness of breath.  Vomiting overnight.  No further epistaxis.  Last BM was yesterday.  Objective: Vitals: Blood pressure 119/76, pulse 73, temperature 97.6 F (36.4 C), temperature source Axillary, resp. rate 17, height 5\' 3"  (1.6 m), weight 51.7 kg, last menstrual period 02/19/2022, SpO2 98 %, currently breastfeeding.   Exam: Gen Exam:Alert awake-not in any distress HEENT:atraumatic, normocephalic Chest: B/L clear to auscultation anteriorly CVS:S1S2 regular Abdomen:soft non tender, non distended Extremities:no edema Neurology: Non focal Skin: no rash  Pertinent Labs/Radiology:    Latest Ref Rng & Units 05/03/2022    3:28 AM 05/02/2022    2:40  AM 05/01/2022    3:33 AM  CBC  WBC 4.0 - 10.5 K/uL 5.0  5.3  4.1   Hemoglobin 12.0 - 15.0 g/dL 16.0  10.9  32.3   Hematocrit 36.0 - 46.0 % 35.5  32.9  33.4   Platelets 150 - 400 K/uL 98  112  126     Lab Results  Component Value Date   NA 129 (L) 05/04/2022   K 3.8 05/04/2022   CL 106 05/04/2022   CO2 18 (L) 05/04/2022      Assessment/Plan: Acute ischemic stroke Embolic in the setting of hypercoagulable state due to breast cancer Workup as above On Lovenox-dosage adjusted by oncology given ongoing issues with epistaxis.  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 Jearld Fenton planning to remove 1 packing today.  Thankfully no further epistaxis.  Vomiting Occurred x 1 overnight-none since then. Last BM yesterday Abdomen nondistended X-ray with dilated loops of small bowel-however exam is completely benign-watch closely for now-if vomiting reoccurs-will need further imaging.  History of C. difficile colitis On oral vancomycin-as she is on Augmentin   Normocytic anemia Due to underlying malignancy S/p 2 units of PRBC on 4/6-follow CBC periodically  Hyponatremia Mild Stop IVF-and see how she does. Her volume status is stable-and she is at risk of developing SIADH.  GERD Pepcid  Abnormal EBV/CMV serology Evaluated by infectious disease-unclear significance.  Palliative care And  DNR in place Patient desires to continue with current care including treatment modalities for now-goal is to return to home and live as long as possible with good quality of life, once her quality of life is no longer acceptable-she is open to  hospice/comfort measures.  BMI: Estimated body mass index is 20.19 kg/m as calculated from the following:   Height as of this encounter:  (1.6 m).   Weight as of this encounter: 51.7 kg.   Code status:   Code Status: DNR   DVT Prophylaxis: Lovenox  Family Communication: Spouse at bedside   Disposition Plan: Status is: Inpatient Remains inpatient appropriate because: Severity of illness   Planned Discharge Destination:Home health   Diet: Diet Order             Diet regular Room service appropriate? Yes; Fluid consistency: Thin  Diet effective now                     Antimicrobial agents: Anti-infectives (From admission, onward)    Start     Dose/Rate Route Frequency Ordered Stop   05/02/22 2200  vancomycin (VANCOCIN) capsule 125 mg        125 mg Oral 2 times daily 05/02/22 1345 05/09/22 0959   05/01/22 1100  amoxicillin-clavulanate (AUGMENTIN) 875-125 MG per tablet 1 tablet        1 tablet Oral Every 12 hours 05/01/22 1012 05/08/22 0959   04/29/22 1000  vancomycin (VANCOREADY) IVPB 750 mg/150 mL  Status:  Discontinued        750 mg 150 mL/hr over 60 Minutes Intravenous Every 12 hours 05/10/2022 1858 04/29/22 0954   04/29/22 0715  vancomycin (VANCOCIN) capsule 125 mg  Status:  Discontinued        125 mg Oral 3 times daily before meals & bedtime 04/29/22 0702 05/02/22 1345   04/29/22 0600  vancomycin (VANCOREADY) IVPB 750 mg/150 mL  Status:  Discontinued        750 mg 150 mL/hr over 60 Minutes Intravenous Every 12 hours 04/26/2022 1732 04/29/2022 1858   04/27/2022 1900  vancomycin (VANCOREADY) IVPB 1250 mg/250 mL        1,250 mg 166.7 mL/hr over 90 Minutes Intravenous  Once 05/11/2022 1841 05/01/2022 2300   05/10/2022 1706  ampicillin (OMNIPEN) 2 g in sodium chloride 0.9 % 100 mL IVPB  Status:  Discontinued        2 g 300 mL/hr over 20 Minutes Intravenous Every 4 hours 05/24/2022 1634 04/29/22 0954   05/09/2022 1645  cefTRIAXone (ROCEPHIN) 2 g in sodium chloride 0.9 % 100  mL IVPB  Status:  Discontinued        2 g 200 mL/hr over 30 Minutes Intravenous Every 12 hours 04/27/2022 1634 04/29/22 0954   05/21/2022 1645  vancomycin (VANCOREADY) IVPB 1250 mg/250 mL  Status:  Discontinued        1,250 mg 166.7 mL/hr over 90 Minutes Intravenous  Once 05/19/2022 1634 05/03/2022 1841        MEDICATIONS: Scheduled Meds:  amoxicillin-clavulanate  1 tablet Oral Q12H   Chlorhexidine Gluconate Cloth  6 each Topical Daily   dexamethasone  4 mg Oral Q12H   docusate sodium  100 mg Oral BID   enoxaparin (LOVENOX) injection  50 mg Subcutaneous Daily   famotidine  40 mg Oral BID   feeding supplement  237 mL Oral TID BM   saccharomyces boulardii  250 mg Oral BID   sodium chloride flush  10-40 mL Intracatheter  Q12H   vancomycin  125 mg Oral BID   Continuous Infusions:  sodium chloride 50 mL/hr at 05/03/22 1200   ondansetron (ZOFRAN) IV     PRN Meds:.bacitracin, butalbital-acetaminophen-caffeine, diphenhydrAMINE **AND** prochlorperazine, lidocaine-prilocaine, LORazepam, melatonin, oxyCODONE, polyethylene glycol, sodium chloride flush   I have personally reviewed following labs and imaging studies  LABORATORY DATA: CBC: Recent Labs  Lab 04/29/22 0624 04/30/22 0830 04/30/22 1750 05/01/22 0333 05/02/22 0240 05/03/22 0328  WBC 3.8* 2.7*  --  4.1 5.3 5.0  NEUTROABS 3.2 1.2*  --  0.8* 2.7 2.6  HGB 8.0* 8.3* 13.6 12.0 11.4* 11.7*  HCT 24.9* 25.0* 39.4 33.4* 32.9* 35.5*  MCV 92.2 89.9  --  85.2 86.1 90.1  PLT 138* 146*  --  126* 112* 98*    Basic Metabolic Panel: Recent Labs  Lab 05/10/2022 1031 04/29/22 0624 04/30/22 0220 05/01/22 0333 05/02/22 0240 05/03/22 0328 05/04/22 0420  NA 130*   < > 133* 137 134* 133* 129*  K 3.8   < > 3.9 3.7 3.9 4.2 3.8  CL 106   < > 109 109 109 106 106  CO2 17*   < > 17* 19* 18* 18* 18*  GLUCOSE 118*   < > 121* 114* 103* 117* 109*  BUN 6   < > 5* 7 10 8 9   CREATININE 0.39*   < > 0.42* 0.46 0.38* 0.40* 0.38*  CALCIUM 7.8*   < > 7.6*  7.6* 7.4* 7.7* 7.4*  MG 2.0  --   --   --   --   --   --    < > = values in this interval not displayed.    GFR: Estimated Creatinine Clearance: 80.9 mL/min (A) (by C-G formula based on SCr of 0.38 mg/dL (L)).  Liver Function Tests: Recent Labs  Lab 04/29/22 0624 04/30/22 0220 05/01/22 0333 05/02/22 0240 05/03/22 0328  AST 312* 271* 178* 148* 141*  ALT 402* 444* 368* 331* 319*  ALKPHOS 887* 1,002* 953* 968* 1,018*  BILITOT 1.5* 1.3* 2.0* 1.6* 1.7*  PROT 5.2* 5.7* 6.1* 5.7* 6.1*  ALBUMIN 2.4* 2.7* 2.9* 2.7* 2.9*   No results for input(s): "LIPASE", "AMYLASE" in the last 168 hours. No results for input(s): "AMMONIA" in the last 168 hours.  Coagulation Profile: Recent Labs  Lab 04/30/22 0220 05/01/22 0333 05/02/22 0240 05/03/22 0328 05/04/22 0420  INR 1.2 1.2 1.2 1.0 1.1    Cardiac Enzymes: No results for input(s): "CKTOTAL", "CKMB", "CKMBINDEX", "TROPONINI" in the last 168 hours.  BNP (last 3 results) No results for input(s): "PROBNP" in the last 8760 hours.  Lipid Profile: No results for input(s): "CHOL", "HDL", "LDLCALC", "TRIG", "CHOLHDL", "LDLDIRECT" in the last 72 hours.  Thyroid Function Tests: No results for input(s): "TSH", "T4TOTAL", "FREET4", "T3FREE", "THYROIDAB" in the last 72 hours.  Anemia Panel: No results for input(s): "VITAMINB12", "FOLATE", "FERRITIN", "TIBC", "IRON", "RETICCTPCT" in the last 72 hours.  Urine analysis:    Component Value Date/Time   COLORURINE STRAW (A) 03/07/2022 1630   APPEARANCEUR CLEAR 03/07/2022 1630   LABSPEC 1.031 (H) 03/07/2022 1630   PHURINE 7.0 03/07/2022 1630   GLUCOSEU NEGATIVE 03/07/2022 1630   GLUCOSEU NEGATIVE 08/24/2017 1211   HGBUR NEGATIVE 03/07/2022 1630   BILIRUBINUR NEGATIVE 03/07/2022 1630   BILIRUBINUR N 08/18/2015 1554   KETONESUR NEGATIVE 03/07/2022 1630   PROTEINUR NEGATIVE 03/07/2022 1630   UROBILINOGEN 0.2 08/24/2017 1211   NITRITE NEGATIVE 03/07/2022 1630   LEUKOCYTESUR NEGATIVE  03/07/2022 1630    Sepsis Labs:  Lactic Acid, Venous    Component Value Date/Time   LATICACIDVEN 1.2 03/07/2022 1630    MICROBIOLOGY: Recent Results (from the past 240 hour(s))  CSF culture w Gram Stain     Status: None   Collection Time: 05/05/2022  2:22 PM   Specimen: CSF; Cerebrospinal Fluid  Result Value Ref Range Status   Specimen Description CSF  Final   Special Requests Immunocompromised  Final   Gram Stain   Final    WBC PRESENT,BOTH PMN AND MONONUCLEAR NO ORGANISMS SEEN CYTOSPIN SMEAR    Culture   Final    NO GROWTH 3 DAYS Performed at [facility], [address], [place], [place] [address]    Report Status 05/02/2022 FINAL  Final  Fungus Culture With Stain     Status: None (Preliminary result)   Collection Time: 05/18/2022  2:42 PM  Result Value Ref Range Status   Fungus Stain Final report  Final    Comment: (NOTE) Performed At: [facility] [address] [place], [place] [id number] Jolene Schimke MD EX:[id number]    Fungus (Mycology) Culture PENDING  Incomplete   Fungal Source CSF  Final    Comment: Performed at [facility], [address], [place], [place] [address]  Fungus Culture Result     Status: None   Collection Time: 05/22/2022  2:42 PM  Result Value Ref Range Status   Result 1 Comment  Final    Comment: (NOTE) KOH/Calcofluor preparation:  no fungus observed. Performed At: [facility] [address] [place], [place] [id number] Jolene Schimke MD KF:[id number]   Culture, blood (single) w Reflex to ID Panel     Status: None   Collection Time: 05/16/2022  6:23 PM   Specimen: BLOOD  Result Value Ref Range Status   Specimen Description BLOOD SITE NOT SPECIFIED  Final   Special Requests   Final    BOTTLES DRAWN AEROBIC AND ANAEROBIC Blood Culture adequate volume   Culture   Final    NO GROWTH 5 DAYS Performed at [facility], [address] [place], [place] [address]    Report Status 05/03/2022 FINAL  Final     RADIOLOGY STUDIES/RESULTS: DG Abd 2 Views  Result Date: 05/04/2022 CLINICAL DATA:  Vomiting EXAM: ABDOMEN - 2 VIEW COMPARISON:  None Available. FINDINGS: There are dilated loops of small bowel in the right hemiabdomen measuring up to 4 cm. Findings can be seen in the setting of small-bowel obstruction. No pneumoperitoneum. No pneumatosis. No acute osseous abnormality. Lung bases are clear. IMPRESSION: Dilated loops of small bowel in the right hemiabdomen can be seen in the setting of a small-bowel obstruction. Electronically Signed   By: Lorenza Cambridge M.D.   On: 05/04/2022 09:05     LOS: 6 days   Jeoffrey Massed, MD  [facility]    To contact the attending provider between 7A-7P or the covering provider during after hours 7P-7A, please log into the web site [email or web address] and access using universal [place] password for that web site. If you do not have the password, please call the hospital operator.  05/04/2022, 9:15 AM

## 2022-05-05 ENCOUNTER — Inpatient Hospital Stay (HOSPITAL_COMMUNITY): Payer: Commercial Managed Care - PPO

## 2022-05-05 DIAGNOSIS — Z7189 Other specified counseling: Secondary | ICD-10-CM | POA: Diagnosis not present

## 2022-05-05 DIAGNOSIS — I639 Cerebral infarction, unspecified: Secondary | ICD-10-CM | POA: Diagnosis not present

## 2022-05-05 DIAGNOSIS — R040 Epistaxis: Secondary | ICD-10-CM | POA: Diagnosis not present

## 2022-05-05 DIAGNOSIS — K50918 Crohn's disease, unspecified, with other complication: Secondary | ICD-10-CM

## 2022-05-05 DIAGNOSIS — K56609 Unspecified intestinal obstruction, unspecified as to partial versus complete obstruction: Secondary | ICD-10-CM | POA: Diagnosis not present

## 2022-05-05 DIAGNOSIS — T50905A Adverse effect of unspecified drugs, medicaments and biological substances, initial encounter: Secondary | ICD-10-CM

## 2022-05-05 DIAGNOSIS — G96198 Other disorders of meninges, not elsewhere classified: Secondary | ICD-10-CM | POA: Diagnosis not present

## 2022-05-05 DIAGNOSIS — K716 Toxic liver disease with hepatitis, not elsewhere classified: Secondary | ICD-10-CM | POA: Diagnosis not present

## 2022-05-05 DIAGNOSIS — C50912 Malignant neoplasm of unspecified site of left female breast: Secondary | ICD-10-CM | POA: Diagnosis not present

## 2022-05-05 LAB — CBC WITH DIFFERENTIAL/PLATELET
Abs Immature Granulocytes: 0.09 10*3/uL — ABNORMAL HIGH (ref 0.00–0.07)
Basophils Absolute: 0 10*3/uL (ref 0.0–0.1)
Basophils Relative: 0 %
Eosinophils Absolute: 0 10*3/uL (ref 0.0–0.5)
Eosinophils Relative: 0 %
HCT: 33.4 % — ABNORMAL LOW (ref 36.0–46.0)
Hemoglobin: 11.2 g/dL — ABNORMAL LOW (ref 12.0–15.0)
Immature Granulocytes: 2 %
Lymphocytes Relative: 40 %
Lymphs Abs: 2 10*3/uL (ref 0.7–4.0)
MCH: 29.9 pg (ref 26.0–34.0)
MCHC: 33.5 g/dL (ref 30.0–36.0)
MCV: 89.3 fL (ref 80.0–100.0)
Monocytes Absolute: 0.4 10*3/uL (ref 0.1–1.0)
Monocytes Relative: 8 %
Neutro Abs: 2.5 10*3/uL (ref 1.7–7.7)
Neutrophils Relative %: 50 %
Platelets: 108 10*3/uL — ABNORMAL LOW (ref 150–400)
RBC: 3.74 MIL/uL — ABNORMAL LOW (ref 3.87–5.11)
RDW: 22.6 % — ABNORMAL HIGH (ref 11.5–15.5)
WBC: 5 10*3/uL (ref 4.0–10.5)
nRBC: 0.4 % — ABNORMAL HIGH (ref 0.0–0.2)

## 2022-05-05 LAB — COMPREHENSIVE METABOLIC PANEL
ALT: 222 U/L — ABNORMAL HIGH (ref 0–44)
AST: 84 U/L — ABNORMAL HIGH (ref 15–41)
Albumin: 2.7 g/dL — ABNORMAL LOW (ref 3.5–5.0)
Alkaline Phosphatase: 804 U/L — ABNORMAL HIGH (ref 38–126)
Anion gap: 4 — ABNORMAL LOW (ref 5–15)
BUN: 10 mg/dL (ref 6–20)
CO2: 19 mmol/L — ABNORMAL LOW (ref 22–32)
Calcium: 7 mg/dL — ABNORMAL LOW (ref 8.9–10.3)
Chloride: 110 mmol/L (ref 98–111)
Creatinine, Ser: 0.37 mg/dL — ABNORMAL LOW (ref 0.44–1.00)
GFR, Estimated: >60 mL/min (ref >60)
Glucose, Bld: 115 mg/dL — ABNORMAL HIGH (ref 70–99)
Potassium: 3.9 mmol/L (ref 3.5–5.1)
Sodium: 133 mmol/L — ABNORMAL LOW (ref 135–145)
Total Bilirubin: 1.6 mg/dL — ABNORMAL HIGH (ref 0.3–1.2)
Total Protein: 5.4 g/dL — ABNORMAL LOW (ref 6.5–8.1)

## 2022-05-05 LAB — PROTIME-INR
INR: 1.3 — ABNORMAL HIGH (ref 0.8–1.2)
Prothrombin Time: 16.2 seconds — ABNORMAL HIGH (ref 11.4–15.2)

## 2022-05-05 MED ORDER — OLAPARIB 150 MG PO TABS
300.0000 mg | ORAL_TABLET | Freq: Two times a day (BID) | ORAL | Status: DC
  Administered 2022-05-05 – 2022-05-09 (×8): 300 mg via ORAL
  Filled 2022-05-05 (×8): qty 2

## 2022-05-05 MED ORDER — LIDOCAINE HCL URETHRAL/MUCOSAL 2 % EX GEL
1.0000 | Freq: Once | CUTANEOUS | Status: DC
  Filled 2022-05-05: qty 6

## 2022-05-05 NOTE — Progress Notes (Signed)
Pt refused NG placement over night. She is worried about having nose bleeds again since the packing in her nose was just taken out yesterday. Pt requested that NG be placed in the morning if still needed. Denies n/v and abdominal pain over night. Will continue to monitor.

## 2022-05-05 NOTE — Progress Notes (Signed)
Physical Therapy Treatment Patient Details Name: Carla Clark MRN: [id number] DOB: 1985-05-19 Today's Date: 05/05/2022   History of Present Illness Carla Clark is a 37 y.o. female admitted 04/28/22 with headache and emesis. After CT and MRI imaging revealed Foci within the anterior right frontal lobe white matter, posterior left frontal lobe white matter and left occipital cortex are most compatible with acute infarcts. 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 PMH includes metastatic breast cancer currently undergoing chemotherapy, Crohn's disease with ileitis s/p left hemicolectomy, SBO.    PT Comments    Pt with continued progress towards acute goals with session focused on improved balance/postural reactions and LE strength and ROM. Pt able to complete high level balance activities standing EOB with up to mod A to steady as pt with multiple LOB in single leg stance. Pt able to complete sitting and supine therex for increased strength with cues for technique. Plan to trial rollator next session for increased stability with ambulation. Pt continues to benefit from skilled PT services to progress toward functional mobility goals.    Recommendations for follow up therapy are one component of a multi-disciplinary discharge planning process, led by the attending physician.  Recommendations may be updated based on patient status, additional functional criteria and insurance authorization.  Follow Up Recommendations       Assistance Recommended at Discharge Intermittent Supervision/Assistance  Patient can return home with the following A little help with walking and/or transfers;Help with stairs or ramp for entrance;Assist for transportation   Equipment Recommendations  None recommended by PT (possibly rollator pending trial next session)    Recommendations for Other Services       Precautions / Restrictions Precautions Precautions: Fall Restrictions Weight  Bearing Restrictions: No     Mobility  Bed Mobility Overal bed mobility: Modified Independent Bed Mobility: Sit to Supine     Supine to sit: Modified independent (Device/Increase time) Sit to supine: Modified independent (Device/Increase time)        Transfers Overall transfer level: Needs assistance Equipment used: 1 person hand held assist Transfers: Sit to/from Stand, Bed to chair/wheelchair/BSC Sit to Stand: Min assist   Step pivot transfers: Min assist       General transfer comment: min A to steady on rise, completing x5 throughotu session, min A to stey to step to recliner    Ambulation/Gait               General Gait Details: deferred for focus on balance activites   Stairs             Wheelchair Mobility    Modified Rankin (Stroke Patients Only) Modified Rankin (Stroke Patients Only) Pre-Morbid Rankin Score: No symptoms Modified Rankin: Moderate disability     Balance Overall balance assessment: Needs assistance Sitting-balance support: No upper extremity supported, Feet supported Sitting balance-Leahy Scale: Good     Standing balance support: Single extremity supported, During functional activity Standing balance-Leahy Scale: Fair Standing balance comment: mild unsteadiness             High level balance activites: Other (comment) (step taps to glove box with focus on target and soft foot placement x20, reaching for items on floor x2, rocking step out/back with focus on single leg stand time and balance)              Cognition Arousal/Alertness: Awake/alert Behavior During Therapy: WFL for tasks assessed/performed Overall Cognitive Status: Within Functional Limits for tasks assessed  General Comments: very pleasant and cooperative        Exercises Other Exercises Other Exercises: supine bridge x5, supine SLR x5 each side with curl up, seated marching x5, LAQ x5, hip  adduction in supine x5 ea side, QS x5 ea side, (all exercises written down at pt request also for her to complete throughout day)    General Comments        Pertinent Vitals/Pain Pain Assessment Pain Assessment: No/denies pain Pain Intervention(s): Monitored during session, Limited activity within patient's tolerance    Home Living Family/patient expects to be discharged to:: Private residence Living Arrangements: Spouse/significant other;Children                      Prior Function            PT Goals (current goals can now be found in the care plan section) Acute Rehab PT Goals Patient Stated Goal: get better PT Goal Formulation: With patient/family Time For Goal Achievement: 05/14/22 Progress towards PT goals: Progressing toward goals    Frequency    Min 3X/week      PT Plan Current plan remains appropriate    Co-evaluation              AM-PAC PT "6 Clicks" Mobility   Outcome Measure  Help needed turning from your back to your side while in a flat bed without using bedrails?: None Help needed moving from lying on your back to sitting on the side of a flat bed without using bedrails?: None Help needed moving to and from a bed to a chair (including a wheelchair)?: A Little Help needed standing up from a chair using your arms (e.g., wheelchair or bedside chair)?: A Little Help needed to walk in hospital room?: A Little Help needed climbing 3-5 steps with a railing? : A Lot 6 Click Score: 19    End of Session Equipment Utilized During Treatment: Gait belt Activity Tolerance: Patient tolerated treatment well;Patient limited by fatigue Patient left: with call bell/phone within reach;with family/visitor present;in chair Nurse Communication: Mobility status PT Visit Diagnosis: Unsteadiness on feet (R26.81);Difficulty in walking, not elsewhere classified (R26.2)     Time: 1345-1411 PT Time Calculation (min) (ACUTE ONLY): 26 min  Charges:   $Therapeutic Exercise: 8-22 mins $Neuromuscular Re-education: 8-22 mins                     Jarren Para R. PTA Acute Rehabilitation Services Office: [phone]    Catalina Antigua 05/05/2022, 3:18 PM

## 2022-05-05 NOTE — Progress Notes (Addendum)
PROGRESS NOTE        PATIENT DETAILS Name: Carla Clark Age: 37 y.o. Sex: female Date of Birth: 05/12/1985 Admit Date: 04/29/2022 Admitting Physician Clydie Braunondell A Smith, MD UUV:[id number]:Blyth, Bryon LionsStacey A, MD  Brief Summary: Patient is a 37 y.o.  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  Hospital course was complicated by epistaxis requiring ENT evaluation and nasal packing, transient SBO.  See below for further details.  Significant events: 4/4>> admit to [facility]  Significant studies: 4/4>> MRI brain: Possible leptomeningeal metastatic disease, multiple embolic infarcts. 4/5>> CT angio head/neck: No significant stenosis 4/5>> echo: EF 65-70% 4/10>> x-ray abdomen: Dilated loops of small bowel in the right hemiabdomen. 4/10>> CT abdomen/pelvis: SBO with transition point in the right lower quadrant-swirling of the mesentery.  Significant microbiology data: 4/4>> blood culture: No growth 4/4>> CSF culture: No growth  Procedures: 4/4>> fluoroscopy guided lumbar puncture  Consults: ENT Palliative Hematology/oncology Neurology Infection disease  Subjective: Feels much today-had 2 BMs overnight.  No vomiting since 3 PM.  Objective: Vitals: Blood pressure 133/80, pulse 79, temperature 97.7 F (36.5 C), temperature source Oral, resp. rate 13, height 5\' 3"  (1.6 m), weight 51.7 kg, last menstrual period 02/19/2022, SpO2 100 %, currently breastfeeding.   Exam: Gen Exam:Alert awake-not in any distress HEENT:atraumatic, normocephalic Chest: B/L clear to auscultation anteriorly CVS:S1S2 regular Abdomen:soft non tender, non distended Extremities:no edema Neurology: Non focal Skin: no rash  Pertinent Labs/Radiology:    Latest Ref Rng &  Units 05/05/2022    3:24 AM 05/03/2022    3:28 AM 05/02/2022    2:40 AM  CBC  WBC 4.0 - 10.5 K/uL 5.0  5.0  5.3   Hemoglobin 12.0 - 15.0 g/dL 44.011.2  34.711.7  42.511.4   Hematocrit 36.0 - 46.0 % 33.4  35.5  32.9   Platelets 150 - 400 K/uL 108  98  112     Lab Results  Component Value Date   NA 133 (L) 05/05/2022   K 3.9 05/05/2022   CL 110 05/05/2022   CO2 19 (L) 05/05/2022      Assessment/Plan: Acute ischemic stroke Embolic in the setting of hypercoagulable state due to breast cancer Workup as above Was on Lovenox-this has been held for now-see below.  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 Jearld FentonByers removed both packings 4/10-no recurrence.  Anticoagulation held since yesterday  SBO Developed vomiting from 4/9-4/10 CT abdomen shows possible SBO Lovenox stopped-4/10after extensive risk/benefit discussion-as  nasogastric tube was contemplated-given increased risk of recurrence of epistaxis Remarkable improvement overnight- had BM overnight-no vomiting since 3 PM yesterday-unclear exactly what caused the rapid improvement-she was placed on IV steroids-have started clear liquids. Will follow closely and slowly advance diet.  History of C. difficile colitis Continue oral vancomycin x 5 days after stopping Augmentin-4/09  Normocytic anemia Due to underlying malignancy S/p 2 units of PRBC  on 4/6-follow CBC periodically  Hyponatremia Mild Follow.  GERD Pepcid  Abnormal EBV/CMV serology Evaluated by infectious disease-unclear significance.  Palliative care And  DNR in place Patient desires to continue with current care including treatment modalities for now-goal is to return to home and live as long as possible with good quality of life, once her quality of life is no longer acceptable-she is open to hospice/comfort measures.  BMI: Estimated body mass index is 20.19 kg/m as calculated from the following:   Height as of this encounter: 5\' 3"  (1.6 m).   Weight as of this encounter: 51.7 kg.   Code status:   Code Status: DNR   DVT Prophylaxis: Lovenox  Family Communication: Spouse at bedside   Disposition Plan: Status is: Inpatient Remains inpatient appropriate because: Severity of illness   Planned Discharge Destination:Home health   Diet: Diet Order             Diet clear liquid Room service appropriate? Yes; Fluid consistency: Thin  Diet effective now                     Antimicrobial agents: Anti-infectives (From admission, onward)    Start     Dose/Rate Route Frequency Ordered Stop   05/02/22 2200  vancomycin (VANCOCIN) capsule 125 mg        125 mg Oral 2 times daily 05/02/22 1345 05/09/22 0959   05/01/22 1100  amoxicillin-clavulanate (AUGMENTIN) 875-125 MG per tablet 1 tablet  Status:  Discontinued        1 tablet Oral Every 12 hours 05/01/22 1012 05/05/22 0624   04/29/22 1000  vancomycin (VANCOREADY) IVPB 750 mg/150 mL  Status:  Discontinued        750 mg 150 mL/hr over 60 Minutes Intravenous Every 12 hours 04/25/2022 1858 04/29/22 0954   04/29/22 0715  vancomycin (VANCOCIN) capsule 125 mg  Status:  Discontinued        125 mg Oral 3 times daily before meals & bedtime 04/29/22 0702 05/02/22 1345   04/29/22 0600  vancomycin (VANCOREADY) IVPB 750 mg/150 mL  Status:  Discontinued        750 mg 150 mL/hr over 60 Minutes Intravenous Every 12 hours 05/04/2022 1732 05/02/2022 1858   05/16/2022 1900  vancomycin (VANCOREADY) IVPB 1250 mg/250 mL        1,250 mg 166.7 mL/hr over 90 Minutes Intravenous  Once 05/15/2022 1841 05/13/2022  2300   05/04/2022 1706  ampicillin (OMNIPEN) 2 g in sodium chloride 0.9 % 100 mL IVPB  Status:  Discontinued        2 g 300 mL/hr over 20 Minutes Intravenous Every 4 hours 05/13/2022 1634 04/29/22 0954   05/09/2022 1645  cefTRIAXone (ROCEPHIN) 2 g in sodium chloride 0.9 % 100 mL IVPB  Status:  Discontinued        2 g 200 mL/hr over 30 Minutes Intravenous Every 12 hours 05/22/2022 1634 04/29/22 0954   04/29/2022 1645  vancomycin (VANCOREADY) IVPB 1250 mg/250 mL  Status:  Discontinued        1,250 mg 166.7 mL/hr over 90 Minutes Intravenous  Once 05/23/2022 1634 05/05/2022 1841        MEDICATIONS: Scheduled Meds:  Chlorhexidine Gluconate Cloth  6 each Topical Daily   docusate sodium  100 mg Oral BID   famotidine  40 mg Oral BID   feeding supplement  237 mL Oral TID BM   lidocaine  1 Application Topical Once   methylPREDNISolone (SOLU-MEDROL) injection  60  mg Intravenous Q12H   saccharomyces boulardii  250 mg Oral BID   sodium chloride flush  10-40 mL Intracatheter Q12H   vancomycin  125 mg Oral BID   Continuous Infusions:  lactated ringers 100 mL/hr at 05/05/22 0633   ondansetron (ZOFRAN) IV 8 mg (05/05/22 0721)   PRN Meds:.bacitracin, butalbital-acetaminophen-caffeine, diphenhydrAMINE **AND** prochlorperazine, lidocaine-prilocaine, LORazepam, melatonin, oxyCODONE, polyethylene glycol, sodium chloride flush   I have personally reviewed following labs and imaging studies  LABORATORY DATA: CBC: Recent Labs  Lab 04/30/22 0830 04/30/22 1750 05/01/22 0333 05/02/22 0240 05/03/22 0328 05/05/22 0324  WBC 2.7*  --  4.1 5.3 5.0 5.0  NEUTROABS 1.2*  --  0.8* 2.7 2.6 2.5  HGB 8.3* 13.6 12.0 11.4* 11.7* 11.2*  HCT 25.0* 39.4 33.4* 32.9* 35.5* 33.4*  MCV 89.9  --  85.2 86.1 90.1 89.3  PLT 146*  --  126* 112* 98* 108*     Basic Metabolic Panel: Recent Labs  Lab 05/01/22 0333 05/02/22 0240 05/03/22 0328 05/04/22 0420 05/05/22 0324  NA 137 134* 133* 129* 133*  K 3.7 3.9 4.2 3.8 3.9  CL  109 109 106 106 110  CO2 19* 18* 18* 18* 19*  GLUCOSE 114* 103* 117* 109* 115*  BUN 7 10 8 9 10   CREATININE 0.46 0.38* 0.40* 0.38* 0.37*  CALCIUM 7.6* 7.4* 7.7* 7.4* 7.0*     GFR: Estimated Creatinine Clearance: 80.9 mL/min (A) (by C-G formula based on SCr of 0.37 mg/dL (L)).  Liver Function Tests: Recent Labs  Lab 04/30/22 0220 05/01/22 0333 05/02/22 0240 05/03/22 0328 05/05/22 0324  AST 271* 178* 148* 141* 84*  ALT 444* 368* 331* 319* 222*  ALKPHOS 1,002* 953* 968* 1,018* 804*  BILITOT 1.3* 2.0* 1.6* 1.7* 1.6*  PROT 5.7* 6.1* 5.7* 6.1* 5.4*  ALBUMIN 2.7* 2.9* 2.7* 2.9* 2.7*    No results for input(s): "LIPASE", "AMYLASE" in the last 168 hours. No results for input(s): "AMMONIA" in the last 168 hours.  Coagulation Profile: Recent Labs  Lab 05/01/22 0333 05/02/22 0240 05/03/22 0328 05/04/22 0420 05/05/22 0324  INR 1.2 1.2 1.0 1.1 1.3*     Cardiac Enzymes: No results for input(s): "CKTOTAL", "CKMB", "CKMBINDEX", "TROPONINI" in the last 168 hours.  BNP (last 3 results) No results for input(s): "PROBNP" in the last 8760 hours.  Lipid Profile: No results for input(s): "CHOL", "HDL", "LDLCALC", "TRIG", "CHOLHDL", "LDLDIRECT" in the last 72 hours.  Thyroid Function Tests: No results for input(s): "TSH", "T4TOTAL", "FREET4", "T3FREE", "THYROIDAB" in the last 72 hours.  Anemia Panel: No results for input(s): "VITAMINB12", "FOLATE", "FERRITIN", "TIBC", "IRON", "RETICCTPCT" in the last 72 hours.  Urine analysis:    Component Value Date/Time   COLORURINE STRAW (A) 03/07/2022 1630   APPEARANCEUR CLEAR 03/07/2022 1630   LABSPEC 1.031 (H) 03/07/2022 1630   PHURINE 7.0 03/07/2022 1630   GLUCOSEU NEGATIVE 03/07/2022 1630   GLUCOSEU NEGATIVE 08/24/2017 1211   HGBUR NEGATIVE 03/07/2022 1630   BILIRUBINUR NEGATIVE 03/07/2022 1630   BILIRUBINUR N 08/18/2015 1554   KETONESUR NEGATIVE 03/07/2022 1630   PROTEINUR NEGATIVE 03/07/2022 1630   UROBILINOGEN 0.2 08/24/2017  1211   NITRITE NEGATIVE 03/07/2022 1630   LEUKOCYTESUR NEGATIVE 03/07/2022 1630    Sepsis Labs: Lactic Acid, Venous    Component Value Date/Time   LATICACIDVEN 1.2 03/07/2022 1630    MICROBIOLOGY: Recent Results (from the past 240 hour(s))  CSF culture w Gram Stain     Status: None   Collection Time: 05/13/2022  2:22 PM   Specimen:  CSF; Cerebrospinal Fluid  Result Value Ref Range Status   Specimen Description CSF  Final   Special Requests Immunocompromised  Final   Gram Stain   Final    WBC PRESENT,BOTH PMN AND MONONUCLEAR NO ORGANISMS SEEN CYTOSPIN SMEAR    Culture   Final    NO GROWTH 3 DAYS Performed at [facility], [address] [place], [place] [address]    Report Status 05/02/2022 FINAL  Final  Fungus Culture With Stain     Status: None (Preliminary result)   Collection Time: 04/30/2022  2:42 PM  Result Value Ref Range Status   Fungus Stain Final report  Final    Comment: (NOTE) Performed At: [facility] [address] [place], [place] [id number] Jolene Schimke MD OZ:[id number]    Fungus (Mycology) Culture PENDING  Incomplete   Fungal Source CSF  Final    Comment: Performed at [facility], [address] [place], [place] [address]  Fungus Culture Result     Status: None   Collection Time: 05/02/2022  2:42 PM  Result Value Ref Range Status   Result 1 Comment  Final    Comment: (NOTE) KOH/Calcofluor preparation:  no fungus observed. Performed At: [facility] [address] [place], [place] [id number] Jolene Schimke MD UU:[id number]   Culture, blood (single) w Reflex to ID Panel     Status: None   Collection Time: 05/22/2022  6:23 PM   Specimen: BLOOD  Result Value Ref Range Status   Specimen Description BLOOD SITE NOT SPECIFIED  Final   Special Requests   Final    BOTTLES DRAWN AEROBIC AND ANAEROBIC Blood Culture adequate volume   Culture   Final    NO GROWTH 5 DAYS Performed at [facility], [address],  [place], [place] [address]    Report Status 05/03/2022 FINAL  Final    RADIOLOGY STUDIES/RESULTS: DG Abd 2 Views  Result Date: 05/05/2022 CLINICAL DATA:  Small bowel obstruction. EXAM: ABDOMEN - 2 VIEW COMPARISON:  May 04, 2022. FINDINGS: The bowel gas pattern is normal. There is no evidence of free air. No radio-opaque calculi or other significant radiographic abnormality is seen. IMPRESSION: No abnormal bowel dilatation. Electronically Signed   By: Lupita Raider M.D.   On: 05/05/2022 08:07   CT ABDOMEN PELVIS WO CONTRAST  Result Date: 05/04/2022 CLINICAL DATA:  Bowel obstruction suspected. History of breast cancer. EXAM: CT ABDOMEN AND PELVIS WITHOUT CONTRAST TECHNIQUE: Multidetector CT imaging of the abdomen and pelvis was performed following the standard protocol without IV contrast. RADIATION DOSE REDUCTION: This exam was performed according to the departmental dose-optimization program which includes automated exposure control, adjustment of the mA and/or kV according to patient size and/or use of iterative reconstruction technique. COMPARISON:  CT abdomen and pelvis 04/16/2015 FINDINGS: Lower chest: There is a small pericardial effusion. Hepatobiliary: No focal liver abnormality is seen. No gallstones, gallbladder wall thickening, or biliary dilatation. Pancreas: Unremarkable. No pancreatic ductal dilatation or surrounding inflammatory changes. Spleen: Normal in size without focal abnormality. Adrenals/Urinary Tract: Bladder is markedly distended. Kidneys and adrenal glands are within normal limits. Stomach/Bowel: Patient is likely status post right hemicolectomy. There are dilated small bowel loops throughout the right abdomen with air-fluid levels measuring up 2 3.9 cm in diameter. Jejunal loops and stomach are nondilated. Transition point is seen in the right lower quadrant where there is some swirling of the mesentery (axial images 4/37 through 4/46 mild mesenteric edema present involving  dilated small bowel loops. No pneumatosis or free air. Vascular/Lymphatic: No significant vascular findings are present. No enlarged abdominal or pelvic lymph nodes. Reproductive: Uterus and bilateral adnexa are unremarkable. Other: Trace free fluid in the right upper quadrant. No focal abdominal wall hernia. Musculoskeletal: Innumerable lucent and sclerotic lesions are seen throughout the osseous structures measuring less than 1 cm. Findings are compatible with metastatic disease. IMPRESSION: 1. Small-bowel obstruction with transition point in the right lower quadrant where there is some swirling of the mesentery. Findings may be related to internal hernia, small-bowel volvulus, and/or adhesions. 2. Trace free fluid in the right upper quadrant. 3. Small pericardial effusion. 4. Bladder is markedly distended. 5. Diffuse osseous metastatic disease. These results were called by telephone at the time of interpretation on 05/04/2022 at 3:31 pm to provider [facility] , who verbally acknowledged these results. Electronically Signed   By: Darliss Cheney M.D.   On: 05/04/2022 15:32   DG Abd 2 Views  Result Date: 05/04/2022 CLINICAL DATA:  Vomiting EXAM: ABDOMEN - 2 VIEW COMPARISON:  None Available. FINDINGS: There are dilated loops of small bowel in the right hemiabdomen measuring up to 4 cm. Findings can be seen in the setting of small-bowel obstruction. No pneumoperitoneum. No pneumatosis. No acute osseous abnormality. Lung bases are clear. IMPRESSION: Dilated loops of small bowel in the right hemiabdomen can be seen in the setting of a small-bowel obstruction. Electronically Signed   By: Lorenza Cambridge M.D.   On: 05/04/2022 09:05     LOS: 7 days   Jeoffrey Massed, MD  [facility]    To contact the attending provider between 7A-7P or the covering provider during after hours 7P-7A, please log into the web site [email or web address] and access using universal [place] password for that web site. If you do  not have the password, please call the hospital operator.  05/05/2022, 10:34 AM

## 2022-05-05 NOTE — Progress Notes (Signed)
No vomiting since 3 pm Had 2 BM's overnight NGT never placed-see RN note Feels better-abdomen not distended-not tender Xray abd-looks better today compared to yesterday She is requesting trial of Clears-wants to avoid NGT as much as possible Will place on clears and see how she does.  Will d/w GI/CCS

## 2022-05-05 NOTE — Progress Notes (Signed)
Subjective/Chief Complaint: Had some bowel function , no more n/v, no ab tenderness   Objective: Vital signs in last 24 hours: Temp:  [98.2 F (36.8 C)] 98.2 F (36.8 C) (04/10 1900) Pulse Rate:  [78] 78 (04/10 1900) Resp:  [16] 16 (04/10 1900) BP: (116)/(81) 116/81 (04/10 1900) SpO2:  [99 %] 99 % (04/10 1900) Last BM Date : 04/28/22  Intake/Output from previous day: No intake/output data recorded. Intake/Output this shift: No intake/output data recorded.  Ab soft nontender nondistended  Lab Results:  Recent Labs    05/03/22 0328 05/05/22 0324  WBC 5.0 5.0  HGB 11.7* 11.2*  HCT 35.5* 33.4*  PLT 98* 108*   BMET Recent Labs    05/04/22 0420 05/05/22 0324  NA 129* 133*  K 3.8 3.9  CL 106 110  CO2 18* 19*  GLUCOSE 109* 115*  BUN 9 10  CREATININE 0.38* 0.37*  CALCIUM 7.4* 7.0*   PT/INR Recent Labs    05/04/22 0420 05/05/22 0324  LABPROT 14.2 16.2*  INR 1.1 1.3*   ABG No results for input(s): "PHART", "HCO3" in the last 72 hours.  Invalid input(s): "PCO2", "PO2"  Studies/Results: DG Abd 2 Views  Result Date: 05/05/2022 CLINICAL DATA:  Small bowel obstruction. EXAM: ABDOMEN - 2 VIEW COMPARISON:  May 04, 2022. FINDINGS: The bowel gas pattern is normal. There is no evidence of free air. No radio-opaque calculi or other significant radiographic abnormality is seen. IMPRESSION: No abnormal bowel dilatation. Electronically Signed   By: Lupita Raider M.D.   On: 05/05/2022 08:07   CT ABDOMEN PELVIS WO CONTRAST  Result Date: 05/04/2022 CLINICAL DATA:  Bowel obstruction suspected. History of breast cancer. EXAM: CT ABDOMEN AND PELVIS WITHOUT CONTRAST TECHNIQUE: Multidetector CT imaging of the abdomen and pelvis was performed following the standard protocol without IV contrast. RADIATION DOSE REDUCTION: This exam was performed according to the departmental dose-optimization program which includes automated exposure control, adjustment of the mA and/or kV  according to patient size and/or use of iterative reconstruction technique. COMPARISON:  CT abdomen and pelvis 04/16/2015 FINDINGS: Lower chest: There is a small pericardial effusion. Hepatobiliary: No focal liver abnormality is seen. No gallstones, gallbladder wall thickening, or biliary dilatation. Pancreas: Unremarkable. No pancreatic ductal dilatation or surrounding inflammatory changes. Spleen: Normal in size without focal abnormality. Adrenals/Urinary Tract: Bladder is markedly distended. Kidneys and adrenal glands are within normal limits. Stomach/Bowel: Patient is likely status post right hemicolectomy. There are dilated small bowel loops throughout the right abdomen with air-fluid levels measuring up 2 3.9 cm in diameter. Jejunal loops and stomach are nondilated. Transition point is seen in the right lower quadrant where there is some swirling of the mesentery (axial images 4/37 through 4/46 mild mesenteric edema present involving dilated small bowel loops. No pneumatosis or free air. Vascular/Lymphatic: No significant vascular findings are present. No enlarged abdominal or pelvic lymph nodes. Reproductive: Uterus and bilateral adnexa are unremarkable. Other: Trace free fluid in the right upper quadrant. No focal abdominal wall hernia. Musculoskeletal: Innumerable lucent and sclerotic lesions are seen throughout the osseous structures measuring less than 1 cm. Findings are compatible with metastatic disease. IMPRESSION: 1. Small-bowel obstruction with transition point in the right lower quadrant where there is some swirling of the mesentery. Findings may be related to internal hernia, small-bowel volvulus, and/or adhesions. 2. Trace free fluid in the right upper quadrant. 3. Small pericardial effusion. 4. Bladder is markedly distended. 5. Diffuse osseous metastatic disease. These results were called by telephone  at the time of interpretation on 05/04/2022 at 3:31 pm to provider [facility] , who verbally  acknowledged these results. Electronically Signed   By: Darliss Cheney M.D.   On: 05/04/2022 15:32   DG Abd 2 Views  Result Date: 05/04/2022 CLINICAL DATA:  Vomiting EXAM: ABDOMEN - 2 VIEW COMPARISON:  None Available. FINDINGS: There are dilated loops of small bowel in the right hemiabdomen measuring up to 4 cm. Findings can be seen in the setting of small-bowel obstruction. No pneumoperitoneum. No pneumatosis. No acute osseous abnormality. Lung bases are clear. IMPRESSION: Dilated loops of small bowel in the right hemiabdomen can be seen in the setting of a small-bowel obstruction. Electronically Signed   By: Lorenza Cambridge M.D.   On: 05/04/2022 09:05    Anti-infectives: Anti-infectives (From admission, onward)    Start     Dose/Rate Route Frequency Ordered Stop   05/02/22 2200  vancomycin (VANCOCIN) capsule 125 mg        125 mg Oral 2 times daily 05/02/22 1345 05/09/22 0959   05/01/22 1100  amoxicillin-clavulanate (AUGMENTIN) 875-125 MG per tablet 1 tablet  Status:  Discontinued        1 tablet Oral Every 12 hours 05/01/22 1012 05/05/22 0624   04/29/22 1000  vancomycin (VANCOREADY) IVPB 750 mg/150 mL  Status:  Discontinued        750 mg 150 mL/hr over 60 Minutes Intravenous Every 12 hours 04/28/22 1858 04/29/22 0954   04/29/22 0715  vancomycin (VANCOCIN) capsule 125 mg  Status:  Discontinued        125 mg Oral 3 times daily before meals & bedtime 04/29/22 0702 05/02/22 1345   04/29/22 0600  vancomycin (VANCOREADY) IVPB 750 mg/150 mL  Status:  Discontinued        750 mg 150 mL/hr over 60 Minutes Intravenous Every 12 hours 04/28/22 1732 04/28/22 1858   04/28/22 1900  vancomycin (VANCOREADY) IVPB 1250 mg/250 mL        1,250 mg 166.7 mL/hr over 90 Minutes Intravenous  Once 04/28/22 1841 04/28/22 2300   04/28/22 1706  ampicillin (OMNIPEN) 2 g in sodium chloride 0.9 % 100 mL IVPB  Status:  Discontinued        2 g 300 mL/hr over 20 Minutes Intravenous Every 4 hours 04/28/22 1634 04/29/22 0954    04/28/22 1645  cefTRIAXone (ROCEPHIN) 2 g in sodium chloride 0.9 % 100 mL IVPB  Status:  Discontinued        2 g 200 mL/hr over 30 Minutes Intravenous Every 12 hours 04/28/22 1634 04/29/22 0954   04/28/22 1645  vancomycin (VANCOREADY) IVPB 1250 mg/250 mL  Status:  Discontinued        1,250 mg 166.7 mL/hr over 90 Minutes Intravenous  Once 04/28/22 1634 04/28/22 1841       Assessment/Plan: pSBO Crohns Stage IV breast cancer -CT was worrisome but clinically she is not.  Having bowel function and really back to normal -can have clear liquids today -no surgery indicated- can advance as tolerated and call us back if needed  Emelia Loron 05/05/2022

## 2022-05-05 NOTE — Progress Notes (Signed)
Unfortunately, Carla Clark had a really tough day yesterday.  She was having vomiting.  We did a plain film which showed some bowel dilation.  She then underwent a CT scan which showed bowel obstruction.  There is no obvious malignancy that was noted.  I suspect this may be related to her Crohn's disease.  She is on some steroids.  She has a nasal packing taken out.  Thankfully, she has not required an NG tube.  She has had some bowel movements this morning.  She has had no vomiting for about 3 to 4 hours.  I do appreciate Dr. Freida Busman seeing her.  Dr. Freida Busman wants to hold off and use surgery as a last resort.  I totally agree with this.  We will repeat a plain film of her abdomen today to see how this bowel obstruction looks.  She is off anticoagulation right now.  I think we have to keep her off anticoagulation.  I do appreciate Gastroenterology and ENT also helping out.  She did receive the olaparib.  We really cannot give her this until we know that she can take orally.  Thankfully, her LFTs are getting better.  Phosphatase is 804.  SGPT is 222 and SGOT 84.  Bilirubin is 1.6.  Her white cell count is 5.  Hemoglobin 11.2.  Platelet count is 108,000.  She actually looks a lot better than I would have thought.  It really helps not having an NG tube in.  It helps some of the nasal packing removed.  Her vital signs show temperature of 98.2.  Pulse 78.  Blood pressure 116/81.  Her lungs are clear bilaterally.  She has good air movement bilaterally.  Cardiac exam regular rate and rhythm.  There are no murmurs.  Abdomen is soft.  Bowel sounds are decreased.  There is no guarding or rebound tenderness.  There is no abdominal distention.  There is no fluid wave.  There is no palpable abdominal mass.  There is no hepatosplenomegaly.  Extremity shows no clubbing, cyanosis or edema.  Neurological exam shows no focal neurological deficits.   I am somewhat dismayed about the fact that she all of a sudden  developed a bowel obstruction.  She is doing everything that she needs to do.  Maybe, this will resolve on its own.  Hopefully will.  Again she looks much better than I would have thought.  We will see what the abdominal film looks like.  We really need to get started on the olaparib for her breast cancer.  I would like to hope that she can start taking some clear liquids today if she is able to depending on what the abdominal film shows.  If she still has a significant obstruction, that we may have to think about TPN for her.  At least, the nasal packing is out.  She is not bleeding.  I knew her attitude is still quite good.  I think if she does need to have surgery, I certainly would not have a problem with this.  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  We are clearly looking at quality of life issues.  If she needs have this obstruction removed, so should be able to eat, this I think would greatly improve her quality of life.  Again, this is incredibly complex.  I know a lot of people are working really hard to get her better so she can go  home and enjoy herself and her family.  Christin Bach, MD  Philippians 4:13

## 2022-05-05 NOTE — Progress Notes (Signed)
Progress Note  Primary GI: Dr. Rhea Belton  LOS: 7 days   Chief Complaint: Elevated LFTs/bowel obstruction   Subjective  Patient states she is feeling better today.  Denies abdominal pain.  Has had 2 bowel movements since yesterday.  No nausea and vomiting, denies abdominal pain.  Reports she is passing gas regularly.  Nasal packing has been removed.  She has not had a chance to add anything on her stomach.  Tried a few sips of chicken broth while I was in the room and was able to tolerate it at that time  Patient with family at bedside, husband and other guests. Provided some of the history.     Objective   Vital signs in last 24 hours: Temp:  [97.7 F (36.5 C)-98.2 F (36.8 C)] 97.7 F (36.5 C) (04/11 0938) Pulse Rate:  [72-79] 79 (04/11 0939) Resp:  [13-16] 13 (04/11 0938) BP: (116-133)/(80-81) 133/80 (04/11 0938) SpO2:  [99 %-100 %] 100 % (04/11 0939) Last BM Date : 04/28/22 Last BM recorded by nurses in past 5 days Stool Type: Type 6 (Mushy consistency with ragged edges) (05/03/2022  9:39 AM)  General:   female in no acute distress  Heart:  Regular rate and rhythm; no murmurs Pulm: Clear anteriorly; no wheezing Abdomen: soft, nondistended, normal bowel sounds in all quadrants. Nontender without guarding. No organomegaly appreciated. Extremities:  No edema Neurologic:  Alert and  oriented x4;  No focal deficits.  Psych:  Cooperative. Normal mood and affect.  Intake/Output from previous day: No intake/output data recorded. Intake/Output this shift: No intake/output data recorded.  Studies/Results: DG Abd 2 Views  Result Date: 05/05/2022 CLINICAL DATA:  Small bowel obstruction. EXAM: ABDOMEN - 2 VIEW COMPARISON:  May 04, 2022. FINDINGS: The bowel gas pattern is normal. There is no evidence of free air. No radio-opaque calculi or other significant radiographic abnormality is seen. IMPRESSION: No abnormal bowel dilatation. Electronically Signed   By: Lupita Raider M.D.    On: 05/05/2022 08:07   CT ABDOMEN PELVIS WO CONTRAST  Result Date: 05/04/2022 CLINICAL DATA:  Bowel obstruction suspected. History of breast cancer. EXAM: CT ABDOMEN AND PELVIS WITHOUT CONTRAST TECHNIQUE: Multidetector CT imaging of the abdomen and pelvis was performed following the standard protocol without IV contrast. RADIATION DOSE REDUCTION: This exam was performed according to the departmental dose-optimization program which includes automated exposure control, adjustment of the mA and/or kV according to patient size and/or use of iterative reconstruction technique. COMPARISON:  CT abdomen and pelvis 04/16/2015 FINDINGS: Lower chest: There is a small pericardial effusion. Hepatobiliary: No focal liver abnormality is seen. No gallstones, gallbladder wall thickening, or biliary dilatation. Pancreas: Unremarkable. No pancreatic ductal dilatation or surrounding inflammatory changes. Spleen: Normal in size without focal abnormality. Adrenals/Urinary Tract: Bladder is markedly distended. Kidneys and adrenal glands are within normal limits. Stomach/Bowel: Patient is likely status post right hemicolectomy. There are dilated small bowel loops throughout the right abdomen with air-fluid levels measuring up 2 3.9 cm in diameter. Jejunal loops and stomach are nondilated. Transition point is seen in the right lower quadrant where there is some swirling of the mesentery (axial images 4/37 through 4/46 mild mesenteric edema present involving dilated small bowel loops. No pneumatosis or free air. Vascular/Lymphatic: No significant vascular findings are present. No enlarged abdominal or pelvic lymph nodes. Reproductive: Uterus and bilateral adnexa are unremarkable. Other: Trace free fluid in the right upper quadrant. No focal abdominal wall hernia. Musculoskeletal: Innumerable lucent and sclerotic lesions are seen  throughout the osseous structures measuring less than 1 cm. Findings are compatible with metastatic disease.  IMPRESSION: 1. Small-bowel obstruction with transition point in the right lower quadrant where there is some swirling of the mesentery. Findings may be related to internal hernia, small-bowel volvulus, and/or adhesions. 2. Trace free fluid in the right upper quadrant. 3. Small pericardial effusion. 4. Bladder is markedly distended. 5. Diffuse osseous metastatic disease. These results were called by telephone at the time of interpretation on 05/04/2022 at 3:31 pm to provider [facility] , who verbally acknowledged these results. Electronically Signed   By: Darliss Cheney M.D.   On: 05/04/2022 15:32   DG Abd 2 Views  Result Date: 05/04/2022 CLINICAL DATA:  Vomiting EXAM: ABDOMEN - 2 VIEW COMPARISON:  None Available. FINDINGS: There are dilated loops of small bowel in the right hemiabdomen measuring up to 4 cm. Findings can be seen in the setting of small-bowel obstruction. No pneumoperitoneum. No pneumatosis. No acute osseous abnormality. Lung bases are clear. IMPRESSION: Dilated loops of small bowel in the right hemiabdomen can be seen in the setting of a small-bowel obstruction. Electronically Signed   By: Lorenza Cambridge M.D.   On: 05/04/2022 09:05    Lab Results: Recent Labs    05/03/22 0328 05/05/22 0324  WBC 5.0 5.0  HGB 11.7* 11.2*  HCT 35.5* 33.4*  PLT 98* 108*   BMET Recent Labs    05/03/22 0328 05/04/22 0420 05/05/22 0324  NA 133* 129* 133*  K 4.2 3.8 3.9  CL 106 106 110  CO2 18* 18* 19*  GLUCOSE 117* 109* 115*  BUN 8 9 10   CREATININE 0.40* 0.38* 0.37*  CALCIUM 7.7* 7.4* 7.0*   LFT Recent Labs    05/05/22 0324  PROT 5.4*  ALBUMIN 2.7*  AST 84*  ALT 222*  ALKPHOS 804*  BILITOT 1.6*   PT/INR Recent Labs    05/04/22 0420 05/05/22 0324  LABPROT 14.2 16.2*  INR 1.1 1.3*     Scheduled Meds:  Chlorhexidine Gluconate Cloth  6 each Topical Daily   docusate sodium  100 mg Oral BID   famotidine  40 mg Oral BID   feeding supplement  237 mL Oral TID BM   lidocaine   1 Application Topical Once   methylPREDNISolone (SOLU-MEDROL) injection  60 mg Intravenous Q12H   saccharomyces boulardii  250 mg Oral BID   sodium chloride flush  10-40 mL Intracatheter Q12H   vancomycin  125 mg Oral BID   Continuous Infusions:  lactated ringers 40 mL/hr at 05/05/22 1113   ondansetron (ZOFRAN) IV 8 mg (05/05/22 0721)      Patient profile:   37 year old female with history of ileocolonic Crohn's disease, recent C. difficile infection (03/2022), metastatic breast cancer presented with headache and nosebleeds admitted with stroke and leptomeningeal spread. 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 -Continue to trend LFTs -Continue Solu-Medrol  Partial small bowel obstruction in the setting of Crohn's and stage IV breast cancer -Followed by surgery.  Clinically doing well.  Doing trial of clears today.   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

## 2022-05-05 NOTE — Progress Notes (Signed)
Daily Progress Note   Patient Name: Carla Clark       Date: 05/05/2022 DOB: 27-Oct-1985  Age: 37 y.o. MRN#: [id number] Attending Physician: Maretta Bees, MD Primary Care Physician: Bradd Canary, MD Admit Date: 05/15/2022  Reason for Consultation/Follow-up: Establishing goals of care  Subjective: Medical records reviewed including progress notes, labs, imaging. Patient assessed at the bedside.  She reports feeling weak today, attributed to not eating yesterday and recovery from pSBO. Her husband and mother are present visiting.  Carla Clark shares that she is navigating the urge to return home with the desire to improve enough to avoid rehospitalization.  She wants to go home as soon as possible in the context of medical stability and optimization.  Carla Clark and Carla Clark feel that if this takes longer than another week, they would like to arrange for visit with her 2 babies.  They have been thinking about where to do this, such as downstairs near [place].  They are also interested in trying a couple doses of her olaparib while in the hospital to ensure there is support in place should she have any complications.  As long as she begins tolerating oral intake, of course.   [name] remains very hopeful and motivated to continue fighting her cancer.  She is open to outpatient palliative care follow-up at the cancer center, hoping that she will never need to transition to hospice.  We again clarified that this would be an option only for her if her quality of life were to severely decline, such as with worsening confusion and inability to recognize/interact with loved ones.  Questions and concerns addressed. PMT will continue to support holistically.   Length of Stay: 7  Physical Exam Vitals and nursing note  reviewed.  Constitutional:      General: She is sleeping. She is not in acute distress. Cardiovascular:     Rate and Rhythm: Normal rate.  Pulmonary:     Effort: Pulmonary effort is normal.  Neurological:     Mental Status: She is oriented to person, place, and time.  Psychiatric:        Mood and Affect: Mood normal.        Behavior: Behavior normal.            Vital Signs: BP 133/80 (BP Location: Right Arm)  Pulse 79   Temp 97.7 F (36.5 C) (Oral)   Resp 13   Ht 5\' 3"  (1.6 m)   Wt 51.7 kg   LMP 02/19/2022   SpO2 100%   BMI 20.19 kg/m  SpO2: SpO2: 100 % O2 Device: O2 Device: Room Air O2 Flow Rate:        Palliative Assessment/Data: 60%   Palliative Care Assessment & Plan   Patient Profile: 37 y.o. female  with past medical history of metastatic breast cancer currently undergoing chemotherapy, Crohn's disease with ileitis s/p left hemicolectomy, SBO, and gerd admitted on 05/16/2022 with headaches and bloody emesis.    [facility] then returned due to persistent symptoms. MRI of the brain noted 1.4 focus of enhancement within or along the right postcentral sulcus with adjacent cortical edema, 1.3 focus of enhancement along the medial left parietal lobe with adjacent cortical edema, multiple infarcts noted within the bilateral frontal parietal lobes of various ages thought to be embolic in nature, and prominent enhancement bilateral 7th and 8th cranial nerves within the auditory canals concern for leptomeningeal metastatic disease.   PMT has been consulted to assist with goals of care conversation.  Assessment: Goals of care conversation Multiple CVAs Breast cancer with metastases and leptomeningeal spread Epistaxis  Recommendations/Plan: Continue DNR Continue full scope treatment Goal is to return home, pursue further cancer treatment, live as long as possible with good quality of life Ordered ambulatory referral to outpatient  [facility] Patient and family are in favor of transitioning to comfort focused care when her quality of life becomes unacceptable; this would be with symptoms such as severe confusion and inability to recognize/interact with her loved ones PMT will continue to follow and support   Prognosis: Poor prognosis with metastatic breast cancer and new leptomeningeal spread   Discharge Planning: Home with Palliative Services  Care plan was discussed with patient, patient's husband, patient's mother   MDM: High   Ronnell Clinger Jeni SallesP Morgen Ritacco, PA-C  Palliative Medicine Team Team phone # [phone]  Thank you for allowing the Palliative Medicine Team to assist in the care of this patient. Please utilize secure chat with additional questions, if there is no response within 30 minutes please call the above phone number.  Palliative Medicine Team providers are available by phone from 7am to 7pm daily and can be reached through the team cell phone.  Should this patient require assistance outside of these hours, please call the patient's attending physician.

## 2022-05-05 NOTE — Progress Notes (Signed)
Occupational Therapy Treatment Patient Details Name: Carla Clark MRN: [id number] DOB: 1985/07/18 Today's Date: 05/05/2022   History of present illness Carla Clark is a 37 y.o. female admitted 05/16/2022 with headache and emesis. After CT and MRI imaging revealed Foci within the anterior right frontal lobe white matter, posterior left frontal lobe white matter and left occipital cortex are most compatible with acute infarcts. 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 PMH includes metastatic breast cancer currently undergoing chemotherapy, Crohn's disease with ileitis s/p left hemicolectomy, SBO.   OT comments  Pt making progress with functional goals. Pt seated in recline upon arrival with family members present. Pt instructed on B UE ROM exercises in multiple planes to prevent joint stiffness (pt requested due to feeling "stiff'). Pt able to return demo without pain or difficulty.reviewed energy conservation techniques for ADLs and ADL mobility, initiated A/E education (EC trg) with visual demo provided. Pt would benefit from acute OT services to maximize level of function and safety   Recommendations for follow up therapy are one component of a multi-disciplinary discharge planning process, led by the attending physician.  Recommendations may be updated based on patient status, additional functional criteria and insurance authorization.    Assistance Recommended at Discharge PRN  Patient can return home with the following  A little help with bathing/dressing/bathroom;Assistance with cooking/housework   Equipment Recommendations  None recommended by OT    Recommendations for Other Services      Precautions / Restrictions Precautions Precautions: Fall Restrictions Weight Bearing Restrictions: No       Mobility Bed Mobility               General bed mobility comments: pt sitting in recliner upon arrival    Transfers Overall transfer level: Needs  assistance Equipment used: 1 person hand held assist Transfers: Sit to/from Stand, Bed to chair/wheelchair/BSC Sit to Stand: Min guard Stand pivot transfers: Min guard   Step pivot transfers: Min guard           Balance Overall balance assessment: Needs assistance Sitting-balance support: No upper extremity supported, Feet supported Sitting balance-Leahy Scale: Good     Standing balance support: Single extremity supported, During functional activity Standing balance-Leahy Scale: Fair Standing balance comment: mild unsteadiness                           ADL either performed or assessed with clinical judgement   ADL Overall ADL's : Needs assistance/impaired                         Toilet Transfer: Min guard;Stand-pivot, pt with mild unsteadiness   Toileting- Clothing Manipulation and Hygiene: Min guard;Sit to/from stand       Functional mobility during ADLs: Min guard General ADL Comments: reviewed energy conservation techniques for ADLs and ADL mobility, initiated A/E education (EC trg) with visual demo provided    Extremity/Trunk Assessment     Lower Extremity Assessment Lower Extremity Assessment: Defer to PT evaluation   Cervical / Trunk Assessment Cervical / Trunk Assessment: Normal    Vision Ability to See in Adequate Light: 0 Adequate Patient Visual Report: No change from baseline     Perception     Praxis      Cognition Arousal/Alertness: Awake/alert Behavior During Therapy: WFL for tasks assessed/performed Overall Cognitive Status: Within Functional Limits for tasks assessed  General Comments: very pleasant and cooperative        Exercises Other Exercises Other Exercises: Pt instructed on B UE ROM exercises in multiple planes to prevent joint stiffness (pt requested due to feeling "stiff'). Pt able to return demo without pain or difficulty.    Shoulder Instructions        General Comments      Pertinent Vitals/ Pain       Pain Assessment Pain Assessment: No/denies pain Faces Pain Scale: No hurt Pain Intervention(s): Monitored during session  Home Living Family/patient expects to be discharged to:: Private residence Living Arrangements: Spouse/significant other;Children                                      Prior Functioning/Environment              Frequency  Min 2X/week        Progress Toward Goals  OT Goals(current goals can now be found in the care plan section)  Progress towards OT goals: Progressing toward goals     Plan Discharge plan remains appropriate    Co-evaluation                 AM-PAC OT "6 Clicks" Daily Activity     Outcome Measure   Help from another person eating meals?: None Help from another person taking care of personal grooming?: None Help from another person toileting, which includes using toliet, bedpan, or urinal?: A Little Help from another person bathing (including washing, rinsing, drying)?: A Little Help from another person to put on and taking off regular upper body clothing?: None Help from another person to put on and taking off regular lower body clothing?: A Little 6 Click Score: 21    End of Session Equipment Utilized During Treatment: Gait belt  OT Visit Diagnosis: Muscle weakness (generalized) (M62.81);Unsteadiness on feet (R26.81)   Activity Tolerance Patient tolerated treatment well   Patient Left with call bell/phone within reach;in chair;with chair alarm set   Nurse Communication          Time: [id number] OT Time Calculation (min): 23 min  Charges: OT General Charges $OT Visit: 1 Visit OT Treatments $Therapeutic Activity: 8-22 mins $Therapeutic Exercise: 8-22 mins    Carla Clark, Carla Clark 05/05/2022, 12:30 PM

## 2022-05-06 ENCOUNTER — Inpatient Hospital Stay (HOSPITAL_COMMUNITY): Payer: Commercial Managed Care - PPO

## 2022-05-06 DIAGNOSIS — I639 Cerebral infarction, unspecified: Secondary | ICD-10-CM | POA: Diagnosis not present

## 2022-05-06 DIAGNOSIS — R040 Epistaxis: Secondary | ICD-10-CM | POA: Diagnosis not present

## 2022-05-06 DIAGNOSIS — C50912 Malignant neoplasm of unspecified site of left female breast: Secondary | ICD-10-CM | POA: Diagnosis not present

## 2022-05-06 DIAGNOSIS — K56609 Unspecified intestinal obstruction, unspecified as to partial versus complete obstruction: Secondary | ICD-10-CM | POA: Diagnosis not present

## 2022-05-06 DIAGNOSIS — G96198 Other disorders of meninges, not elsewhere classified: Secondary | ICD-10-CM | POA: Diagnosis not present

## 2022-05-06 DIAGNOSIS — K920 Hematemesis: Secondary | ICD-10-CM | POA: Diagnosis not present

## 2022-05-06 DIAGNOSIS — K716 Toxic liver disease with hepatitis, not elsewhere classified: Secondary | ICD-10-CM | POA: Diagnosis not present

## 2022-05-06 LAB — CBC WITH DIFFERENTIAL/PLATELET
Abs Immature Granulocytes: 0.1 10*3/uL — ABNORMAL HIGH (ref 0.00–0.07)
Basophils Absolute: 0 10*3/uL (ref 0.0–0.1)
Basophils Relative: 0 %
Eosinophils Absolute: 0 10*3/uL (ref 0.0–0.5)
Eosinophils Relative: 0 %
HCT: 31.2 % — ABNORMAL LOW (ref 36.0–46.0)
Hemoglobin: 10.7 g/dL — ABNORMAL LOW (ref 12.0–15.0)
Lymphocytes Relative: 19 %
Lymphs Abs: 1 10*3/uL (ref 0.7–4.0)
MCH: 30.7 pg (ref 26.0–34.0)
MCHC: 34.3 g/dL (ref 30.0–36.0)
MCV: 89.4 fL (ref 80.0–100.0)
Metamyelocytes Relative: 2 %
Monocytes Absolute: 0.5 10*3/uL (ref 0.1–1.0)
Monocytes Relative: 9 %
Neutro Abs: 3.6 10*3/uL (ref 1.7–7.7)
Neutrophils Relative %: 70 %
Platelets: 115 10*3/uL — ABNORMAL LOW (ref 150–400)
RBC: 3.49 MIL/uL — ABNORMAL LOW (ref 3.87–5.11)
RDW: 22.5 % — ABNORMAL HIGH (ref 11.5–15.5)
WBC: 5.2 10*3/uL (ref 4.0–10.5)
nRBC: 0.4 % — ABNORMAL HIGH (ref 0.0–0.2)
nRBC: 2 /100 WBC — ABNORMAL HIGH

## 2022-05-06 LAB — COMPREHENSIVE METABOLIC PANEL
ALT: 211 U/L — ABNORMAL HIGH (ref 0–44)
AST: 78 U/L — ABNORMAL HIGH (ref 15–41)
Albumin: 2.8 g/dL — ABNORMAL LOW (ref 3.5–5.0)
Alkaline Phosphatase: 756 U/L — ABNORMAL HIGH (ref 38–126)
Anion gap: 7 (ref 5–15)
BUN: 8 mg/dL (ref 6–20)
CO2: 19 mmol/L — ABNORMAL LOW (ref 22–32)
Calcium: 7.4 mg/dL — ABNORMAL LOW (ref 8.9–10.3)
Chloride: 107 mmol/L (ref 98–111)
Creatinine, Ser: 0.36 mg/dL — ABNORMAL LOW (ref 0.44–1.00)
GFR, Estimated: >60 mL/min (ref >60)
Glucose, Bld: 120 mg/dL — ABNORMAL HIGH (ref 70–99)
Potassium: 4.1 mmol/L (ref 3.5–5.1)
Sodium: 133 mmol/L — ABNORMAL LOW (ref 135–145)
Total Bilirubin: 1.6 mg/dL — ABNORMAL HIGH (ref 0.3–1.2)
Total Protein: 5.9 g/dL — ABNORMAL LOW (ref 6.5–8.1)

## 2022-05-06 LAB — PROTIME-INR
INR: 1.3 — ABNORMAL HIGH (ref 0.8–1.2)
Prothrombin Time: 16.5 seconds — ABNORMAL HIGH (ref 11.4–15.2)

## 2022-05-06 MED ORDER — METHYLPREDNISOLONE SODIUM SUCC 125 MG IJ SOLR
60.0000 mg | Freq: Every day | INTRAMUSCULAR | Status: DC
  Administered 2022-05-07 – 2022-05-08 (×2): 60 mg via INTRAVENOUS
  Filled 2022-05-06 (×2): qty 2

## 2022-05-06 MED ORDER — SALINE SPRAY 0.65 % NA SOLN
1.0000 | NASAL | Status: DC | PRN
  Filled 2022-05-06: qty 44

## 2022-05-06 MED ORDER — PROCHLORPERAZINE EDISYLATE 10 MG/2ML IJ SOLN
10.0000 mg | Freq: Four times a day (QID) | INTRAMUSCULAR | Status: DC | PRN
  Administered 2022-05-07 – 2022-05-08 (×3): 10 mg via INTRAVENOUS
  Filled 2022-05-06 (×3): qty 2

## 2022-05-06 MED ORDER — KETOROLAC TROMETHAMINE 30 MG/ML IJ SOLN
30.0000 mg | Freq: Once | INTRAMUSCULAR | Status: AC
  Administered 2022-05-06: 30 mg via INTRAVENOUS
  Filled 2022-05-06: qty 1

## 2022-05-06 MED ORDER — HYDROMORPHONE HCL 1 MG/ML IJ SOLN
1.0000 mg | INTRAMUSCULAR | Status: DC | PRN
  Administered 2022-05-07 – 2022-05-09 (×3): 1 mg via INTRAVENOUS
  Filled 2022-05-06 (×4): qty 1

## 2022-05-06 MED ORDER — SODIUM CHLORIDE 0.9 % IV SOLN
40.0000 mg | Freq: Two times a day (BID) | INTRAVENOUS | Status: DC
  Administered 2022-05-06 – 2022-05-09 (×7): 40 mg via INTRAVENOUS
  Filled 2022-05-06 (×9): qty 4

## 2022-05-06 NOTE — Plan of Care (Signed)

## 2022-05-06 NOTE — Progress Notes (Signed)
PROGRESS NOTE        PATIENT DETAILS Name: Carla Clark Age: 37 y.o. Sex: female Date of Birth: 01/10/1986 Admit Date: May 13, 2022 Admitting Physician Clydie Braun, MD ZOX:[id number], Bryon Lions, MD  Brief Summary: Patient is a 37 y.o.  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  Hospital course was complicated by epistaxis requiring ENT evaluation and nasal packing, transient SBO.  See below for further details.  Significant events: 4/4>> admit to [facility]  Significant studies: 4/4>> MRI brain: Possible leptomeningeal metastatic disease, multiple embolic infarcts. 4/5>> CT angio head/neck: No significant stenosis 4/5>> echo: EF 65-70% 4/10>> x-ray abdomen: Dilated loops of small bowel in the right hemiabdomen. 4/10>> CT abdomen/pelvis: SBO with transition point in the right lower quadrant-swirling of the mesentery.  Significant microbiology data: 4/4>> blood culture: No growth 4/4>> CSF culture: No growth  Procedures: 4/4>> fluoroscopy guided lumbar puncture  Consults: ENT Palliative Hematology/oncology Neurology Infection disease  Subjective: Vomited twice earlier this morning.  No abdominal pain.  Had BM yesterday.  Objective: Vitals: Blood pressure 124/84, pulse 60, temperature 98 F (36.7 C), temperature source Oral, resp. rate 17, height  (1.6 m), weight 51.7 kg, last menstrual period 02/19/2022, SpO2 99 %, currently breastfeeding.   Exam: Gen Exam:Alert awake-not in any distress HEENT:atraumatic, normocephalic Chest: B/L clear to auscultation anteriorly CVS:S1S2 regular Abdomen:soft non tender, non distended Extremities:no edema Neurology: Non focal Skin: no rash  Pertinent Labs/Radiology:    Latest  Ref Rng & Units 05/06/2022    2:26 AM 05/05/2022    3:24 AM 05/03/2022    3:28 AM  CBC  WBC 4.0 - 10.5 K/uL 5.2  5.0  5.0   Hemoglobin 12.0 - 15.0 g/dL 54.0  98.1  19.1   Hematocrit 36.0 - 46.0 % 31.2  33.4  35.5   Platelets 150 - 400 K/uL 115  108  98     Lab Results  Component Value Date   NA 133 (L) 05/06/2022   K 4.1 05/06/2022   CL 107 05/06/2022   CO2 19 (L) 05/06/2022      Assessment/Plan: Acute ischemic stroke Embolic in the setting of hypercoagulable state due to breast cancer Workup as above Was on Lovenox-this has been held for now-see below.  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 Follow LFTs periodically  Epistaxis S/p bilateral cauterization-has bilateral nasal packing in place Empirically on Augmentin ENT following-Dr. Jearld Fenton removed both packings 4/10-with no recurrence-although had very minimal epistaxis earlier this morning.   Use Afrin as needed.  If has severe epistaxis-will need ENT evaluation again.  SBO Developed vomiting from 4/9-4/10-see the abdomen showed possible SBO Thankfully resolved with supportive care-did not even require NG tube.  Note-Lovenox was held in anticipation of patient requiring NG tube-in the setting of recent epistaxis.  Vomiting Has had intermittent vomiting for the past several days-no evidence of SBO on x-ray abdomen 4/12 Suspect this may be related to her malignancy/And underlying medical issues  Reviewed Dr. Gustavo Lah note-plan is to minimize oral medications is much as possible. Per patient-Zofran does not provide much relief-she wants to try Compazine.  History of C. difficile colitis Plan was to continue oral vancomycin x 5 days after stopping  Augmentin-4/09-however due to vomiting-this has not been discontinued.   Does not diarrhea.  Normocytic anemia Due to underlying malignancy S/p 2 units of PRBC on 4/6-follow CBC periodically  Hyponatremia Mild Follow.  GERD Pepcid  Abnormal EBV/CMV serology Evaluated by infectious disease-unclear significance.  Palliative care And DNR in place Patient desires to continue with current care including treatment modalities for now-goal is to return to home and live as long as possible with good quality of life, once her quality of life is no longer acceptable-she is open to hospice/comfort measures.  BMI: Estimated body mass index is 20.19 kg/m as calculated from the following:   Height as of this encounter: 5\' 3"  (1.6 m).   Weight as of this encounter: 51.7 kg.   Code status:   Code Status: DNR   DVT Prophylaxis: Lovenox  Family Communication: Spouse at bedside   Disposition Plan: Status is: Inpatient Remains inpatient appropriate because: Severity of illness   Planned Discharge Destination:Home health   Diet: Diet Order             Diet full liquid Room service appropriate? Yes; Fluid consistency: Thin  Diet effective now                     Antimicrobial agents: Anti-infectives (From admission, onward)    Start     Dose/Rate Route Frequency Ordered Stop   05/02/22 2200  vancomycin (VANCOCIN) capsule 125 mg  Status:  Discontinued        125 mg Oral 2 times daily 05/02/22 1345 05/06/22 0609   05/01/22 1100  amoxicillin-clavulanate (AUGMENTIN) 875-125 MG per tablet 1 tablet  Status:  Discontinued        1 tablet Oral Every 12 hours 05/01/22 1012 05/05/22 0624   04/29/22 1000  vancomycin (VANCOREADY) IVPB 750 mg/150 mL  Status:  Discontinued        750 mg 150 mL/hr over 60 Minutes Intravenous Every 12 hours 05/10/2022 1858 04/29/22 0954   04/29/22 0715  vancomycin (VANCOCIN) capsule 125 mg  Status:  Discontinued        125 mg Oral 3 times daily before  meals & bedtime 04/29/22 0702 05/02/22 1345   04/29/22 0600  vancomycin (VANCOREADY) IVPB 750 mg/150 mL  Status:  Discontinued        750 mg 150 mL/hr over 60 Minutes Intravenous Every 12 hours 05/08/2022 1732 05/24/2022 1858   05/15/2022 1900  vancomycin (VANCOREADY) IVPB 1250 mg/250 mL        1,250 mg 166.7 mL/hr over 90 Minutes Intravenous  Once 05/14/2022 1841 04/26/2022 2300   05/08/2022 1706  ampicillin (OMNIPEN) 2 g in sodium chloride 0.9 % 100 mL IVPB  Status:  Discontinued        2 g 300 mL/hr over 20 Minutes Intravenous Every 4 hours 04/29/2022 1634 04/29/22 0954   05/13/2022 1645  cefTRIAXone (ROCEPHIN) 2 g in sodium chloride 0.9 % 100 mL IVPB  Status:  Discontinued        2 g 200 mL/hr over 30 Minutes Intravenous Every 12 hours 05/23/2022 1634 04/29/22 0954   05/10/2022 1645  vancomycin (VANCOREADY) IVPB 1250 mg/250 mL  Status:  Discontinued        1,250 mg 166.7 mL/hr over  90 Minutes Intravenous  Once 05/02/2022 1634 05/10/2022 1841        MEDICATIONS: Scheduled Meds:  Chlorhexidine Gluconate Cloth  6 each Topical Daily   docusate sodium  100 mg Oral BID   feeding supplement  237 mL Oral TID BM   lidocaine  1 Application Topical Once   methylPREDNISolone (SOLU-MEDROL) injection  60 mg Intravenous Q12H   olaparib  300 mg Oral BID   saccharomyces boulardii  250 mg Oral BID   sodium chloride flush  10-40 mL Intracatheter Q12H   Continuous Infusions:  famotidine (PEPCID) IV     lactated ringers 40 mL/hr at 05/05/22 1533   PRN Meds:.bacitracin, butalbital-acetaminophen-caffeine, diphenhydrAMINE **AND** prochlorperazine, HYDROmorphone (DILAUDID) injection, lidocaine-prilocaine, LORazepam, melatonin, oxyCODONE, polyethylene glycol, sodium chloride flush   I have personally reviewed following labs and imaging studies  LABORATORY DATA: CBC: Recent Labs  Lab 05/01/22 0333 05/02/22 0240 05/03/22 0328 05/05/22 0324 05/06/22 0226  WBC 4.1 5.3 5.0 5.0 5.2  NEUTROABS 0.8* 2.7 2.6 2.5 3.6   HGB 12.0 11.4* 11.7* 11.2* 10.7*  HCT 33.4* 32.9* 35.5* 33.4* 31.2*  MCV 85.2 86.1 90.1 89.3 89.4  PLT 126* 112* 98* 108* 115*     Basic Metabolic Panel: Recent Labs  Lab 05/02/22 0240 05/03/22 0328 05/04/22 0420 05/05/22 0324 05/06/22 0226  NA 134* 133* 129* 133* 133*  K 3.9 4.2 3.8 3.9 4.1  CL 109 106 106 110 107  CO2 18* 18* 18* 19* 19*  GLUCOSE 103* 117* 109* 115* 120*  BUN CREATININE 0.38* 0.40* 0.38* 0.37* 0.36*  CALCIUM 7.4* 7.7* 7.4* 7.0* 7.4*     GFR: Estimated Creatinine Clearance: 80.9 mL/min (A) (by C-G formula based on SCr of 0.36 mg/dL (L)).  Liver Function Tests: Recent Labs  Lab 05/01/22 0333 05/02/22 0240 05/03/22 0328 05/05/22 0324 05/06/22 0226  AST 178* 148* 141* 84* 78*  ALT 368* 331* 319* 222* 211*  ALKPHOS 953* 968* 1,018* 804* 756*  BILITOT 2.0* 1.6* 1.7* 1.6* 1.6*  PROT 6.1* 5.7* 6.1* 5.4* 5.9*  ALBUMIN 2.9* 2.7* 2.9* 2.7* 2.8*    No results for input(s): "LIPASE", "AMYLASE" in the last 168 hours. No results for input(s): "AMMONIA" in the last 168 hours.  Coagulation Profile: Recent Labs  Lab 05/02/22 0240 05/03/22 0328 05/04/22 0420 05/05/22 0324 05/06/22 0226  INR 1.2 1.0 1.1 1.3* 1.3*     Cardiac Enzymes: No results for input(s): "CKTOTAL", "CKMB", "CKMBINDEX", "TROPONINI" in the last 168 hours.  BNP (last 3 results) No results for input(s): "PROBNP" in the last 8760 hours.  Lipid Profile: No results for input(s): "CHOL", "HDL", "LDLCALC", "TRIG", "CHOLHDL", "LDLDIRECT" in the last 72 hours.  Thyroid Function Tests: No results for input(s): "TSH", "T4TOTAL", "FREET4", "T3FREE", "THYROIDAB" in the last 72 hours.  Anemia Panel: No results for input(s): "VITAMINB12", "FOLATE", "FERRITIN", "TIBC", "IRON", "RETICCTPCT" in the last 72 hours.  Urine analysis:    Component Value Date/Time   COLORURINE STRAW (A) 03/07/2022 1630   APPEARANCEUR CLEAR 03/07/2022 1630   LABSPEC 1.031 (H) 03/07/2022 1630    PHURINE 7.0 03/07/2022 1630   GLUCOSEU NEGATIVE 03/07/2022 1630   GLUCOSEU NEGATIVE 08/24/2017 1211   HGBUR NEGATIVE 03/07/2022 1630   BILIRUBINUR NEGATIVE 03/07/2022 1630   BILIRUBINUR N 08/18/2015 1554   KETONESUR NEGATIVE 03/07/2022 1630   PROTEINUR NEGATIVE 03/07/2022 1630   UROBILINOGEN 0.2 08/24/2017 1211   NITRITE NEGATIVE 03/07/2022 1630   LEUKOCYTESUR NEGATIVE 03/07/2022 1630    Sepsis Labs: Lactic Acid,  Venous    Component Value Date/Time   LATICACIDVEN 1.2 03/07/2022 1630    MICROBIOLOGY: Recent Results (from the past 240 hour(s))  CSF culture w Gram Stain     Status: None   Collection Time: 05/20/2022  2:22 PM   Specimen: CSF; Cerebrospinal Fluid  Result Value Ref Range Status   Specimen Description CSF  Final   Special Requests Immunocompromised  Final   Gram Stain   Final    WBC PRESENT,BOTH PMN AND MONONUCLEAR NO ORGANISMS SEEN CYTOSPIN SMEAR    Culture   Final    NO GROWTH 3 DAYS Performed at [facility], [address], [place], [place] [address]    Report Status 05/02/2022 FINAL  Final  Fungus Culture With Stain     Status: None (Preliminary result)   Collection Time: 05/01/2022  2:42 PM  Result Value Ref Range Status   Fungus Stain Final report  Final    Comment: (NOTE) Performed At: [facility] [address] [place], [place] [id number] Jolene Schimke MD YT:[id number]    Fungus (Mycology) Culture PENDING  Incomplete   Fungal Source CSF  Final    Comment: Performed at [facility], [address] [place], [place] [address]  Fungus Culture Result     Status: None   Collection Time: 05/06/2022  2:42 PM  Result Value Ref Range Status   Result 1 Comment  Final    Comment: (NOTE) KOH/Calcofluor preparation:  no fungus observed. Performed At: [facility] [address] [place], [place] [id number] Jolene Schimke MD CB:[id number]   Culture, blood (single) w Reflex to ID Panel     Status: None   Collection Time: 05/11/2022   6:23 PM   Specimen: BLOOD  Result Value Ref Range Status   Specimen Description BLOOD SITE NOT SPECIFIED  Final   Special Requests   Final    BOTTLES DRAWN AEROBIC AND ANAEROBIC Blood Culture adequate volume   Culture   Final    NO GROWTH 5 DAYS Performed at [facility], [address], [place], [place] [address]    Report Status 05/03/2022 FINAL  Final    RADIOLOGY STUDIES/RESULTS: DG Abd 2 Views  Result Date: 05/06/2022 CLINICAL DATA:  Vomiting. EXAM: ABDOMEN - 2 VIEW COMPARISON:  05/05/2022 FINDINGS: Suture chains identified within the right lower quadrant of the abdomen. Bowel gas pattern appears nonobstructed. There are no dilated loops of large or small bowel. Gas and stool is noted within the colon up to the rectum. No signs of pneumoperitoneum. IMPRESSION: Nonobstructive bowel gas pattern. Electronically Signed   By: Signa Kell M.D.   On: 05/06/2022 07:30   DG Abd 2 Views  Result Date: 05/05/2022 CLINICAL DATA:  Small bowel obstruction. EXAM: ABDOMEN - 2 VIEW COMPARISON:  May 04, 2022. FINDINGS: The bowel gas pattern is normal. There is no evidence of free air. No radio-opaque calculi or other significant radiographic abnormality is seen. IMPRESSION: No abnormal bowel dilatation. Electronically Signed   By: Lupita Raider M.D.   On: 05/05/2022 08:07   CT ABDOMEN PELVIS WO CONTRAST  Result Date: 05/04/2022 CLINICAL DATA:  Bowel obstruction suspected. History of breast cancer. EXAM: CT ABDOMEN AND PELVIS WITHOUT CONTRAST TECHNIQUE: Multidetector CT imaging of the abdomen and pelvis was performed following the standard protocol without IV contrast. RADIATION DOSE REDUCTION: This exam was performed according to the departmental dose-optimization program which includes automated exposure control, adjustment of the mA and/or kV according to patient  size and/or use of iterative reconstruction technique. COMPARISON:  CT abdomen and pelvis 04/16/2015 FINDINGS: Lower chest: There  is a small pericardial effusion. Hepatobiliary: No focal liver abnormality is seen. No gallstones, gallbladder wall thickening, or biliary dilatation. Pancreas: Unremarkable. No pancreatic ductal dilatation or surrounding inflammatory changes. Spleen: Normal in size without focal abnormality. Adrenals/Urinary Tract: Bladder is markedly distended. Kidneys and adrenal glands are within normal limits. Stomach/Bowel: Patient is likely status post right hemicolectomy. There are dilated small bowel loops throughout the right abdomen with air-fluid levels measuring up 2 3.9 cm in diameter. Jejunal loops and stomach are nondilated. Transition point is seen in the right lower quadrant where there is some swirling of the mesentery (axial images 4/37 through 4/46 mild mesenteric edema present involving dilated small bowel loops. No pneumatosis or free air. Vascular/Lymphatic: No significant vascular findings are present. No enlarged abdominal or pelvic lymph nodes. Reproductive: Uterus and bilateral adnexa are unremarkable. Other: Trace free fluid in the right upper quadrant. No focal abdominal wall hernia. Musculoskeletal: Innumerable lucent and sclerotic lesions are seen throughout the osseous structures measuring less than 1 cm. Findings are compatible with metastatic disease. IMPRESSION: 1. Small-bowel obstruction with transition point in the right lower quadrant where there is some swirling of the mesentery. Findings may be related to internal hernia, small-bowel volvulus, and/or adhesions. 2. Trace free fluid in the right upper quadrant. 3. Small pericardial effusion. 4. Bladder is markedly distended. 5. Diffuse osseous metastatic disease. These results were called by telephone at the time of interpretation on 05/04/2022 at 3:31 pm to provider [facility] , who verbally acknowledged these results. Electronically Signed   By: Darliss Cheney M.D.   On: 05/04/2022 15:32     LOS: 8 days   Jeoffrey Massed, MD  [facility]    To contact the attending provider between 7A-7P or the covering provider during after hours 7P-7A, please log into the web site [email or web address] and access using universal [place] password for that web site. If you do not have the password, please call the hospital operator.  05/06/2022, 10:57 AM

## 2022-05-06 NOTE — Progress Notes (Signed)
Daily Progress Note   Patient Name: Carla Clark       Date: 05/06/2022 DOB: Oct 26, 1985  Age: 37 y.o. MRN#: [id number] Attending Physician: Maretta Bees, MD Primary Care Physician: Bradd Canary, MD Admit Date: 04/27/2022  Reason for Consultation/Follow-up: Establishing goals of care  Subjective: Medical records reviewed including progress notes, labs, imaging. Patient assessed at the bedside. She is having a harder day than yesterday, with nausea and vomiting due to her cancer treatment. Her husband and mother and present visiting.  Created space and opportunity for patient and family's thoughts and feelings on her current illness.  Emotional support and therapeutic listening was provided. [name] reflected on her symptoms and the appreciation she has for her whole-person centered care. We discussed that I would be off service for a week and that PMT would remain available for any needs in the meantime. Appreciate RN assisting with the nasal spray they have been trying to get for several days. She feels instant relief after administering in bilateral nares.   Questions and concerns addressed. PMT will continue to support holistically.   Length of Stay: 8  Physical Exam Vitals and nursing note reviewed.  Constitutional:      General: She is sleeping. She is not in acute distress. Cardiovascular:     Rate and Rhythm: Normal rate.  Pulmonary:     Effort: Pulmonary effort is normal.  Neurological:     Mental Status: She is oriented to person, place, and time.  Psychiatric:        Mood and Affect: Mood normal.        Behavior: Behavior normal.            Vital Signs: BP 124/84 (BP Location: Right Arm)   Pulse 60   Temp 98 F (36.7 C) (Oral)   Resp 17   Ht  (1.6 m)   Wt  51.7 kg   LMP 02/19/2022   SpO2 99%   BMI 20.19 kg/m  SpO2: SpO2: 99 % O2 Device: O2 Device: Room Air O2 Flow Rate:        Palliative Assessment/Data: 60%   Palliative Care Assessment & Plan   Patient Profile: 37 y.o. female  with past medical history of metastatic breast cancer currently undergoing chemotherapy, Crohn's disease with ileitis s/p left hemicolectomy, SBO,  and gerd admitted on 05/11/2022 with headaches and bloody emesis.    Patient initially presented to [facility] ED then returned due to persistent symptoms. MRI of the brain noted 1.4 focus of enhancement within or along the right postcentral sulcus with adjacent cortical edema, 1.3 focus of enhancement along the medial left parietal lobe with adjacent cortical edema, multiple infarcts noted within the bilateral frontal parietal lobes of various ages thought to be embolic in nature, and prominent enhancement bilateral 7th and 8th cranial nerves within the auditory canals concern for leptomeningeal metastatic disease.   PMT has been consulted to assist with goals of care conversation.  Assessment: Goals of care conversation Multiple CVAs Breast cancer with metastases and leptomeningeal spread Epistaxis  Recommendations/Plan: Continue DNR Continue full scope treatment Outpatient palliative care f/u at [facility] after discharge Goal of care are clear; patient wants to return home, pursue further cancer treatment, live as long as possible with good quality of life  PMT remains available as needed. Please secure chat or call team line with needs. Will otherwise follow peripherally   Prognosis: Poor prognosis with metastatic breast cancer and new leptomeningeal spread   Discharge Planning: Home with Palliative Services  Care plan was discussed with patient, patient's husband, patient's mother   Total time: I spent 35 minutes in the care of the patient today in the above activities and documenting the  encounter.    Richardson Dopp, PA-C Palliative Medicine Team Team phone # [phone]  Thank you for allowing the Palliative Medicine Team to assist in the care of this patient. Please utilize secure chat with additional questions, if there is no response within 30 minutes please call the above phone number.  Palliative Medicine Team providers are available by phone from 7am to 7pm daily and can be reached through the team cell phone.  Should this patient require assistance outside of these hours, please call the patient's attending physician.  Portions of this note are a verbal dictation therefore any spelling and/or grammatical errors are due to the "[employer]" system interpretation.

## 2022-05-06 NOTE — Progress Notes (Signed)
Ms. Camera has some vomiting this morning.  She vomited twice.  Yesterday, she seemed to be doing fairly well.  Her abdominal film does not show any obvious obstruction.  She was on some clear liquids.  We will repeat another abdominal x-ray.  She started the olaparib.  I told her that today, she will only take 1 pill twice a day and not 2 pills twice a day.  She was wondering if she might be to have some of her medicines IV.  She feels that she might be getting sick because of taking so many pills.  We will try to convert as much as we can to IV.  She still has little bit of blood in the nares.  There is some blood in the left nares.  She is off anticoagulation right now.  Will continue to keep her off anticoagulation..  She is on steroids.  Her LFTs are improving nicely.  Her SGPT is too low and SGOT 78.  Alkaline phosphatase is 756.  Bilirubin is 1.6.  Her white cell count of 5.2.  Hemoglobin 10.7.  Platelet count 115,000.  She has had no fever.  There is no cough.   Her vital signs are temperature of 99.  Pulse 60.  Blood pressure 124/84.  Her head and neck exam shows some dried blood in the nares bilaterally.  There is no oral lesions.  Lungs are clear bilaterally.  She has good air movement bilaterally.  Cardiac exam regular rate and rhythm.  Abdomen soft.  Her bowel sounds are decreased.  There is no guarding or rebound tenderness.  There is no fluid wave.  There is no obvious abdominal mass.  There is no palpable hepatomegaly.  Extremity shows no clubbing, cyanosis or edema.  Neurological exam is nonfocal.  Hopefully, we will still see that there is no obstruction with the abdominal film.  If there is no obstruction, the maybe her diet can be advanced.  We will see about making some conversions to IV medications.  I would keep her off the anticoagulants for right now.  She is started the olaparib.  Again she will take 1 pill twice a day for today.  Then looks okay, then should go up to  full dose tomorrow.  It is still hard to figure out when she will be able to go home.  There is still a few issues that we have to address.  I know that she is getting great care from everybody up on 5 W.   Christin Bach, MD  1 Cor 10:31

## 2022-05-06 NOTE — Progress Notes (Signed)
This chaplain is present for F/U spiritual care with the Pt., Pt. husband-David, and Pt. mother-Helga.  The chaplain listened reflectively to the Pt. description of the last two days. The chaplain understand the Pt. Is faithfully holding her medical path with hope and love, while learning to "trust" in every situation.   The chaplain explored the topic of the Pt. children visiting. The chaplain understands the Pt. and family are discerning the best time.  This chaplain is available for F/U spiritual care as needed.  Chaplain Stephanie Acre [phone]

## 2022-05-06 NOTE — Progress Notes (Signed)
Physical Therapy Treatment Patient Details Name: Carla Clark MRN: [id number] DOB: 08/09/1985 Today's Date: 05/06/2022   History of Present Illness Carla Clark is a 37 y.o. female admitted 04/28/22 with headache and emesis. After CT and MRI imaging revealed Foci within the anterior right frontal lobe white matter, posterior left frontal lobe white matter and left occipital cortex are most compatible with acute infarcts. 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 PMH includes metastatic breast cancer currently undergoing chemotherapy, Crohn's disease with ileitis s/p left hemicolectomy, SBO.    PT Comments    Today's session focused on rollator education and use, pt tolerating well. Pt up in chair reports feeling better than this morning, but fatiguing after brief ambulation trial in the room. Pt educated on brakes and use of rollator, providing a convenient place for seated rest breaks. Pt transferred from chair with proper brake management, able to park rollator against a sturdy surface and turn and sit. Pt requiring minA when pivoting back to face rollator as she had a L lateral LOB, once corrected, able to ambulate back to the chair with minA. Pt educated on uses out in the community when fatigued as well as rollator providing a barrier from others in crowded places, agreeable that it would be nice at discharge, recommendations updated. Acute PT will continue to follow pt during admission, encouraged continued mobilization as tolerated.     Recommendations for follow up therapy are one component of a multi-disciplinary discharge planning process, led by the attending physician.  Recommendations may be updated based on patient status, additional functional criteria and insurance authorization.  Follow Up Recommendations       Assistance Recommended at Discharge Intermittent Supervision/Assistance  Patient can return home with the following A little help with walking  and/or transfers;Help with stairs or ramp for entrance;Assist for transportation   Equipment Recommendations  Rollator (4 wheels)    Recommendations for Other Services       Precautions / Restrictions Precautions Precautions: Fall Restrictions Weight Bearing Restrictions: No     Mobility  Bed Mobility               General bed mobility comments: pt in chair upon arrival and ended session in chair    Transfers Overall transfer level: Needs assistance Equipment used: Rollator (4 wheels) Transfers: Sit to/from Stand Sit to Stand: Min assist           General transfer comment: minA for power up with cueing for hand placement on chair, standing from rollator with minG but when turning to utilize rollator pt with L lateral loss requiring minA to correct    Ambulation/Gait Ambulation/Gait assistance: Min guard Gait Distance (Feet): 4 Feet (forwards/backwards with seated rest break on rollator) Assistive device: Rollator (4 wheels) Gait Pattern/deviations: Step-through pattern, Wide base of support, Drifts right/left, Decreased stride length, Decreased dorsiflexion - right, Decreased dorsiflexion - left Gait velocity: decreased     General Gait Details: educated on use of rollator and features, pt ambulating ~4 feet and locking brakes to sit on rollator, and then ambulation back to the chair, mild imbalance noted but seems improved with UE support   Stairs             Wheelchair Mobility    Modified Rankin (Stroke Patients Only) Modified Rankin (Stroke Patients Only) Pre-Morbid Rankin Score: No symptoms Modified Rankin: Moderate disability     Balance Overall balance assessment: Needs assistance Sitting-balance support: No upper extremity supported, Feet supported Sitting  balance-Leahy Scale: Good     Standing balance support: Bilateral upper extremity supported, During functional activity, Reliant on assistive device for balance Standing  balance-Leahy Scale: Poor Standing balance comment: reliant on UE support for balance with static standing and ambulation                            Cognition Arousal/Alertness: Awake/alert Behavior During Therapy: WFL for tasks assessed/performed Overall Cognitive Status: Within Functional Limits for tasks assessed                                 General Comments: very pleasant and cooperative        Exercises      General Comments General comments (skin integrity, edema, etc.): pt fatiguing after brief rollator trial, family at bedside and supportive throughout session      Pertinent Vitals/Pain Pain Assessment Pain Assessment: No/denies pain    Home Living                          Prior Function            PT Goals (current goals can now be found in the care plan section) Acute Rehab PT Goals Patient Stated Goal: get better PT Goal Formulation: With patient/family Time For Goal Achievement: 05/14/22 Potential to Achieve Goals: Good Progress towards PT goals: Progressing toward goals    Frequency    Min 3X/week      PT Plan Equipment recommendations need to be updated    Co-evaluation              AM-PAC PT "6 Clicks" Mobility   Outcome Measure  Help needed turning from your back to your side while in a flat bed without using bedrails?: None Help needed moving from lying on your back to sitting on the side of a flat bed without using bedrails?: None Help needed moving to and from a bed to a chair (including a wheelchair)?: A Little Help needed standing up from a chair using your arms (e.g., wheelchair or bedside chair)?: A Little Help needed to walk in hospital room?: A Lot Help needed climbing 3-5 steps with a railing? : A Lot 6 Click Score: 18    End of Session Equipment Utilized During Treatment: Gait belt Activity Tolerance: Patient tolerated treatment well;Patient limited by fatigue Patient left: in  chair;with call bell/phone within reach;with family/visitor present Nurse Communication: Mobility status PT Visit Diagnosis: Unsteadiness on feet (R26.81);Difficulty in walking, not elsewhere classified (R26.2)     Time: 7215-8727 PT Time Calculation (min) (ACUTE ONLY): 22 min  Charges:  $Therapeutic Activity: 8-22 mins                     Lindalou Hose, PT DPT Acute Rehabilitation Services Office [phone]    DONNA LEATHERWOOD 05/06/2022, 4:06 PM

## 2022-05-07 DIAGNOSIS — K920 Hematemesis: Secondary | ICD-10-CM | POA: Diagnosis not present

## 2022-05-07 DIAGNOSIS — R7989 Other specified abnormal findings of blood chemistry: Secondary | ICD-10-CM | POA: Diagnosis not present

## 2022-05-07 DIAGNOSIS — K50912 Crohn's disease, unspecified, with intestinal obstruction: Secondary | ICD-10-CM | POA: Diagnosis not present

## 2022-05-07 DIAGNOSIS — C50919 Malignant neoplasm of unspecified site of unspecified female breast: Secondary | ICD-10-CM | POA: Diagnosis not present

## 2022-05-07 DIAGNOSIS — C7931 Secondary malignant neoplasm of brain: Secondary | ICD-10-CM

## 2022-05-07 DIAGNOSIS — G96198 Other disorders of meninges, not elsewhere classified: Secondary | ICD-10-CM | POA: Diagnosis not present

## 2022-05-07 DIAGNOSIS — C50912 Malignant neoplasm of unspecified site of left female breast: Secondary | ICD-10-CM | POA: Diagnosis not present

## 2022-05-07 DIAGNOSIS — I639 Cerebral infarction, unspecified: Secondary | ICD-10-CM | POA: Diagnosis not present

## 2022-05-07 DIAGNOSIS — Z8679 Personal history of other diseases of the circulatory system: Secondary | ICD-10-CM

## 2022-05-07 DIAGNOSIS — R040 Epistaxis: Secondary | ICD-10-CM | POA: Diagnosis not present

## 2022-05-07 DIAGNOSIS — K716 Toxic liver disease with hepatitis, not elsewhere classified: Secondary | ICD-10-CM | POA: Diagnosis not present

## 2022-05-07 LAB — CBC WITH DIFFERENTIAL/PLATELET
Abs Immature Granulocytes: 0.05 10*3/uL (ref 0.00–0.07)
Basophils Absolute: 0 10*3/uL (ref 0.0–0.1)
Basophils Relative: 0 %
Eosinophils Absolute: 0 10*3/uL (ref 0.0–0.5)
Eosinophils Relative: 0 %
HCT: 34 % — ABNORMAL LOW (ref 36.0–46.0)
Hemoglobin: 11.2 g/dL — ABNORMAL LOW (ref 12.0–15.0)
Immature Granulocytes: 2 %
Lymphocytes Relative: 36 %
Lymphs Abs: 1.2 10*3/uL (ref 0.7–4.0)
MCH: 29.8 pg (ref 26.0–34.0)
MCHC: 32.9 g/dL (ref 30.0–36.0)
MCV: 90.4 fL (ref 80.0–100.0)
Monocytes Absolute: 0.5 10*3/uL (ref 0.1–1.0)
Monocytes Relative: 15 %
Neutro Abs: 1.6 10*3/uL — ABNORMAL LOW (ref 1.7–7.7)
Neutrophils Relative %: 47 %
Platelets: 118 10*3/uL — ABNORMAL LOW (ref 150–400)
RBC: 3.76 MIL/uL — ABNORMAL LOW (ref 3.87–5.11)
RDW: 22.4 % — ABNORMAL HIGH (ref 11.5–15.5)
WBC: 3.4 10*3/uL — ABNORMAL LOW (ref 4.0–10.5)
nRBC: 0 % (ref 0.0–0.2)

## 2022-05-07 LAB — COMPREHENSIVE METABOLIC PANEL
ALT: 193 U/L — ABNORMAL HIGH (ref 0–44)
AST: 71 U/L — ABNORMAL HIGH (ref 15–41)
Albumin: 2.7 g/dL — ABNORMAL LOW (ref 3.5–5.0)
Alkaline Phosphatase: 687 U/L — ABNORMAL HIGH (ref 38–126)
Anion gap: 6 (ref 5–15)
BUN: 7 mg/dL (ref 6–20)
CO2: 19 mmol/L — ABNORMAL LOW (ref 22–32)
Calcium: 7.3 mg/dL — ABNORMAL LOW (ref 8.9–10.3)
Chloride: 107 mmol/L (ref 98–111)
Creatinine, Ser: 0.4 mg/dL — ABNORMAL LOW (ref 0.44–1.00)
GFR, Estimated: >60 mL/min (ref >60)
Glucose, Bld: 95 mg/dL (ref 70–99)
Potassium: 3.1 mmol/L — ABNORMAL LOW (ref 3.5–5.1)
Sodium: 132 mmol/L — ABNORMAL LOW (ref 135–145)
Total Bilirubin: 1.9 mg/dL — ABNORMAL HIGH (ref 0.3–1.2)
Total Protein: 5.6 g/dL — ABNORMAL LOW (ref 6.5–8.1)

## 2022-05-07 LAB — PROTIME-INR
INR: 1.4 — ABNORMAL HIGH (ref 0.8–1.2)
Prothrombin Time: 16.6 seconds — ABNORMAL HIGH (ref 11.4–15.2)

## 2022-05-07 MED ORDER — POTASSIUM CHLORIDE 10 MEQ/100ML IV SOLN
10.0000 meq | INTRAVENOUS | Status: AC
  Administered 2022-05-07 (×3): 10 meq via INTRAVENOUS
  Filled 2022-05-07 (×3): qty 100

## 2022-05-07 NOTE — Progress Notes (Signed)
Ms. Salentine seems to be doing fairly well this morning.  She does have some oozing from the left nares.  She had a abdominal x-ray yesterday which did not show any obstruction.  She is eating a little bit better.  She took her olaparib yesterday.  She had no problems with this.  She has had little bit of a headache.  Her husband thinks her may be a little bit of a weakness on the left side of her face.  She been off anticoagulation.  We really may need to think about getting her back on.  Her LFTs are improving.  Her SGPT is 193 in the SGOT 71.  Her white cell count 3.4.  Hemoglobin 11.2.  Platelet count 118,000.  She does have some nausea.  There is no vomiting..  She is going to the bathroom.  Her vital signs are all stable.  Blood pressure is 129/80.  Temperature is 98.  Pulse is 71.  Her head and neck exam does show some slight oozing of blood from the left nares.  She has no adenopathy in the neck.  Lungs are clear bilaterally.  Cardiac exam regular rate and rhythm.  Abdomen is soft.  Bowel sounds are present.  She may have had some decreased bowel sounds.  There is no fluid wave.  There is no palpable hepatomegaly.  Extremity shows no clubbing, cyanosis or edema.  Neurological exam shows no focal neurological deficits.  Hopefully, the olaparib will be able to help [place] out.  I think it still may take 2 or 3 weeks before we start to see a response.  This epistaxis is real problem.  It it is limiting [place] from getting on anticoagulation for this CVA.  I know that ENT has been quite helpful for managing the epistaxis.  I think I have heard of using intranasal cocaine for epistaxis.  I have also heard about using transaxemic acid for refractory epistaxis.  Hopefully, she will continue to increase her oral intake.  I know that the staff on [address] have done a great job with her.  This is very complex.     Christin Bach, MD  1 Cor 10:31

## 2022-05-07 NOTE — Plan of Care (Signed)

## 2022-05-07 NOTE — Progress Notes (Addendum)
PROGRESS NOTE        PATIENT DETAILS Name: Carla Clark Age: 37 y.o. Sex: female Date of Birth: [birth year] Admit Date: 05/19/2022 Admitting Physician Clydie Braun, MD ZOX:[id number], Bryon Lions, MD  Brief Summary: Patient is a 37 y.o.  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  Hospital course was complicated by epistaxis requiring ENT evaluation and nasal packing, transient SBO.  See below for further details.  Significant events: 4/4>> admit to [facility]  Significant studies: 4/4>> MRI brain: Possible leptomeningeal metastatic disease, multiple embolic infarcts. 4/5>> CT angio head/neck: No significant stenosis 4/5>> echo: EF 65-70% 4/10>> x-ray abdomen: Dilated loops of small bowel in the right hemiabdomen. 4/10>> CT abdomen/pelvis: SBO with transition point in the right lower quadrant-swirling of the mesentery.  Significant microbiology data: 4/4>> blood culture: No growth 4/4>> CSF culture: No growth  Procedures: 4/4>> fluoroscopy guided lumbar puncture  Consults: ENT Palliative Hematology/oncology Neurology Infection disease  Subjective: Some minimal epistaxis yesterday-nauseous but no vomiting.  Objective: Vitals: Blood pressure 133/73, pulse 72, temperature 98.1 F (36.7 C), temperature source Oral, resp. rate 18, height 5\' 3"  (1.6 m), weight 51.7 kg, last menstrual period 02/19/2022, SpO2 98 %, currently breastfeeding.   Exam: Gen Exam:Alert awake-not in any distress HEENT:atraumatic, normocephalic Chest: B/L clear to auscultation anteriorly CVS:S1S2 regular Abdomen:soft non tender, non distended Extremities:no edema Neurology: Non focal Skin: no rash  Pertinent Labs/Radiology:    Latest Ref Rng & Units  05/07/2022    4:21 AM 05/06/2022    2:26 AM 05/05/2022    3:24 AM  CBC  WBC 4.0 - 10.5 K/uL 3.4  5.2  5.0   Hemoglobin 12.0 - 15.0 g/dL 54.0  98.1  19.1   Hematocrit 36.0 - 46.0 % 34.0  31.2  33.4   Platelets 150 - 400 K/uL 118  115  108     Lab Results  Component Value Date   NA 132 (L) 05/07/2022   K 3.1 (L) 05/07/2022   CL 107 05/07/2022   CO2 19 (L) 05/07/2022      Assessment/Plan: Acute ischemic stroke Embolic in the setting of hypercoagulable state due to breast cancer Workup as above No growth on anticoagulation given numerous issues-including recurrent epistaxis-possible bowel obstruction requiring NG tube placement.  Difficult situation-remains off anticoagulation-patient/spouse aware of risk.  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 LFTs downtrending  Epistaxis S/p bilateral cauterization-with bilateral nasal packings-this was removed on 4/10 Continues to have intermittent epistaxis-mild Use Afrin as needed.  If has severe epistaxis-will need ENT evaluation again.  SBO Developed vomiting from 4/9-4/10-see the abdomen showed possible SBO Thankfully resolved with supportive care-did not even require NG tube.  Note-Lovenox was held in anticipation of patient requiring NG tube-in the setting of recent epistaxis.  Vomiting Has had intermittent vomiting for the past several days-no evidence of SBO on x-ray abdomen 4/12 Suspect this may be related to her malignancy/And underlying medical issues Reviewed Dr. Gustavo Lah note-plan is to  minimize oral medications is much as possible. No further vomiting but nauseous overnight-supportive care.  As needed Compazine.  Hypokalemia Due to intermittent  vomiting Replete/recheck  History of C. difficile colitis Plan was to continue oral vancomycin x 5 days after stopping Augmentin-4/09-however due to vomiting-this has not been discontinued.   Does not have any diarrhea-had of well-formed stool yesterday.  Normocytic anemia Due to underlying malignancy S/p 2 units of PRBC on 4/6-follow CBC periodically  Hyponatremia Mild Follow.  GERD Pepcid  Abnormal EBV/CMV serology Evaluated by infectious disease-unclear significance.  Palliative care And DNR in place Patient desires to continue with current care including treatment modalities for now-goal is to return to home and live as long as possible with good quality of life, once her quality of life is no longer acceptable-she is open to hospice/comfort measures.  BMI: Estimated body mass index is 20.19 kg/m as calculated from the following:   Height as of this encounter: 5\' 3"  (1.6 m).   Weight as of this encounter: 51.7 kg.   Code status:   Code Status: DNR   DVT Prophylaxis: SCD  Family Communication: Spouse at bedside   Disposition Plan: Status is: Inpatient Remains inpatient appropriate because: Severity of illness   Planned Discharge Destination:Home health   Diet: Diet Order             DIET SOFT Room service appropriate? Yes; Fluid consistency: Thin  Diet effective now                     Antimicrobial agents: Anti-infectives (From admission, onward)    Start     Dose/Rate Route Frequency Ordered Stop   05/02/22 2200  vancomycin (VANCOCIN) capsule 125 mg  Status:  Discontinued        125 mg Oral 2 times daily 05/02/22 1345 05/06/22 0609   05/01/22 1100  amoxicillin-clavulanate (AUGMENTIN) 875-125 MG per tablet 1 tablet  Status:  Discontinued        1 tablet Oral Every 12 hours 05/01/22 1012 05/05/22 0624   04/29/22 1000  vancomycin (VANCOREADY) IVPB 750 mg/150 mL  Status:  Discontinued        750 mg 150 mL/hr over 60 Minutes Intravenous Every 12  hours 05/14/2022 1858 04/29/22 0954   04/29/22 0715  vancomycin (VANCOCIN) capsule 125 mg  Status:  Discontinued        125 mg Oral 3 times daily before meals & bedtime 04/29/22 0702 05/02/22 1345   04/29/22 0600  vancomycin (VANCOREADY) IVPB 750 mg/150 mL  Status:  Discontinued        750 mg 150 mL/hr over 60 Minutes Intravenous Every 12 hours 05/07/2022 1732 05/16/2022 1858   05/19/2022 1900  vancomycin (VANCOREADY) IVPB 1250 mg/250 mL        1,250 mg 166.7 mL/hr over 90 Minutes Intravenous  Once 05/01/2022 1841 05/19/2022 2300   05/08/2022 1706  ampicillin (OMNIPEN) 2 g in sodium chloride 0.9 % 100 mL IVPB  Status:  Discontinued        2 g 300 mL/hr over 20 Minutes Intravenous Every 4 hours 05/16/2022 1634 04/29/22 0954   05/08/2022 1645  cefTRIAXone (ROCEPHIN) 2 g in sodium chloride 0.9 % 100 mL IVPB  Status:  Discontinued        2 g 200 mL/hr over 30 Minutes Intravenous Every 12 hours 05/04/2022 1634 04/29/22 0954   04/27/2022 1645  vancomycin (VANCOREADY) IVPB 1250 mg/250 mL  Status:  Discontinued  1,250 mg 166.7 mL/hr over 90 Minutes Intravenous  Once 05/01/2022 1634 05/04/2022 1841        MEDICATIONS: Scheduled Meds:  Chlorhexidine Gluconate Cloth  6 each Topical Daily   docusate sodium  100 mg Oral BID   feeding supplement  237 mL Oral TID BM   lidocaine  1 Application Topical Once   methylPREDNISolone (SOLU-MEDROL) injection  60 mg Intravenous Daily   olaparib  300 mg Oral BID   saccharomyces boulardii  250 mg Oral BID   sodium chloride flush  10-40 mL Intracatheter Q12H   Continuous Infusions:  famotidine (PEPCID) IV 40 mg (05/07/22 0922)   lactated ringers 40 mL/hr at 05/07/22 0539   potassium chloride 10 mEq (05/07/22 0916)   PRN Meds:.bacitracin, butalbital-acetaminophen-caffeine, diphenhydrAMINE **AND** [DISCONTINUED] prochlorperazine, HYDROmorphone (DILAUDID) injection, lidocaine-prilocaine, LORazepam, melatonin, oxyCODONE, polyethylene glycol, prochlorperazine, sodium chloride,  sodium chloride flush   I have personally reviewed following labs and imaging studies  LABORATORY DATA: CBC: Recent Labs  Lab 05/02/22 0240 05/03/22 0328 05/05/22 0324 05/06/22 0226 05/07/22 0421  WBC 5.3 5.0 5.0 5.2 3.4*  NEUTROABS 2.7 2.6 2.5 3.6 1.6*  HGB 11.4* 11.7* 11.2* 10.7* 11.2*  HCT 32.9* 35.5* 33.4* 31.2* 34.0*  MCV 86.1 90.1 89.3 89.4 90.4  PLT 112* 98* 108* 115* 118*     Basic Metabolic Panel: Recent Labs  Lab 05/03/22 0328 05/04/22 0420 05/05/22 0324 05/06/22 0226 05/07/22 0421  NA 133* 129* 133* 133* 132*  K 4.2 3.8 3.9 4.1 3.1*  CL 106 106 110 107 107  CO2 18* 18* 19* 19* 19*  GLUCOSE 117* 109* 115* 120* 95  BUN CREATININE 0.40* 0.38* 0.37* 0.36* 0.40*  CALCIUM 7.7* 7.4* 7.0* 7.4* 7.3*     GFR: Estimated Creatinine Clearance: 80.9 mL/min (A) (by C-G formula based on SCr of 0.4 mg/dL (L)).  Liver Function Tests: Recent Labs  Lab 05/02/22 0240 05/03/22 0328 05/05/22 0324 05/06/22 0226 05/07/22 0421  AST 148* 141* 84* 78* 71*  ALT 331* 319* 222* 211* 193*  ALKPHOS 968* 1,018* 804* 756* 687*  BILITOT 1.6* 1.7* 1.6* 1.6* 1.9*  PROT 5.7* 6.1* 5.4* 5.9* 5.6*  ALBUMIN 2.7* 2.9* 2.7* 2.8* 2.7*    No results for input(s): "LIPASE", "AMYLASE" in the last 168 hours. No results for input(s): "AMMONIA" in the last 168 hours.  Coagulation Profile: Recent Labs  Lab 05/03/22 0328 05/04/22 0420 05/05/22 0324 05/06/22 0226 05/07/22 0421  INR 1.0 1.1 1.3* 1.3* 1.4*     Cardiac Enzymes: No results for input(s): "CKTOTAL", "CKMB", "CKMBINDEX", "TROPONINI" in the last 168 hours.  BNP (last 3 results) No results for input(s): "PROBNP" in the last 8760 hours.  Lipid Profile: No results for input(s): "CHOL", "HDL", "LDLCALC", "TRIG", "CHOLHDL", "LDLDIRECT" in the last 72 hours.  Thyroid Function Tests: No results for input(s): "TSH", "T4TOTAL", "FREET4", "T3FREE", "THYROIDAB" in the last 72 hours.  Anemia Panel: No results for  input(s): "VITAMINB12", "FOLATE", "FERRITIN", "TIBC", "IRON", "RETICCTPCT" in the last 72 hours.  Urine analysis:    Component Value Date/Time   COLORURINE STRAW (A) 03/07/2022 1630   APPEARANCEUR CLEAR 03/07/2022 1630   LABSPEC 1.031 (H) 03/07/2022 1630   PHURINE 7.0 03/07/2022 1630   GLUCOSEU NEGATIVE 03/07/2022 1630   GLUCOSEU NEGATIVE 08/24/2017 1211   HGBUR NEGATIVE 03/07/2022 1630   BILIRUBINUR NEGATIVE 03/07/2022 1630   BILIRUBINUR N 08/18/2015 1554   KETONESUR NEGATIVE 03/07/2022 1630   PROTEINUR NEGATIVE 03/07/2022 1630   UROBILINOGEN 0.2 08/24/2017 1211  NITRITE NEGATIVE 03/07/2022 1630   LEUKOCYTESUR NEGATIVE 03/07/2022 1630    Sepsis Labs: Lactic Acid, Venous    Component Value Date/Time   LATICACIDVEN 1.2 03/07/2022 1630    MICROBIOLOGY: Recent Results (from the past 240 hour(s))  CSF culture w Gram Stain     Status: None   Collection Time: 04/26/2022  2:22 PM   Specimen: CSF; Cerebrospinal Fluid  Result Value Ref Range Status   Specimen Description CSF  Final   Special Requests Immunocompromised  Final   Gram Stain   Final    WBC PRESENT,BOTH PMN AND MONONUCLEAR NO ORGANISMS SEEN CYTOSPIN SMEAR    Culture   Final    NO GROWTH 3 DAYS Performed at [facility], [address], [place], [place] [address]    Report Status 05/02/2022 FINAL  Final  Fungus Culture With Stain     Status: None (Preliminary result)   Collection Time: 05/01/2022  2:42 PM  Result Value Ref Range Status   Fungus Stain Final report  Final    Comment: (NOTE) Performed At: [facility] [address] [place], [place] [id number] Jolene Schimke MD [id number]    Fungus (Mycology) Culture PENDING  Incomplete   Fungal Source CSF  Final    Comment: Performed at [facility], [address], [place], [place] [address]  Fungus Culture Result     Status: None   Collection Time: 04/27/2022  2:42 PM  Result Value Ref Range Status   Result 1 Comment  Final    Comment:  (NOTE) KOH/Calcofluor preparation:  no fungus observed. Performed At: [facility] [address] [place], [place] [id number] Jolene Schimke MD [id number]   Culture, blood (single) w Reflex to ID Panel     Status: None   Collection Time: 05/16/2022  6:23 PM   Specimen: BLOOD  Result Value Ref Range Status   Specimen Description BLOOD SITE NOT SPECIFIED  Final   Special Requests   Final    BOTTLES DRAWN AEROBIC AND ANAEROBIC Blood Culture adequate volume   Culture   Final    NO GROWTH 5 DAYS Performed at [facility], [address], [place], [place] [address]    Report Status 05/03/2022 FINAL  Final    RADIOLOGY STUDIES/RESULTS: DG Abd 2 Views  Result Date: 05/06/2022 CLINICAL DATA:  Vomiting. EXAM: ABDOMEN - 2 VIEW COMPARISON:  05/05/2022 FINDINGS: Suture chains identified within the right lower quadrant of the abdomen. Bowel gas pattern appears nonobstructed. There are no dilated loops of large or small bowel. Gas and stool is noted within the colon up to the rectum. No signs of pneumoperitoneum. IMPRESSION: Nonobstructive bowel gas pattern. Electronically Signed   By: Signa Kell M.D.   On: 05/06/2022 07:30     LOS: 9 days   Jeoffrey Massed, MD  [facility]    To contact the attending provider between 7A-7P or the covering provider during after hours 7P-7A, please log into the web site [email or web address] and access using universal [place] password for that web site. If you do not have the password, please call the hospital operator.  05/07/2022, 10:13 AM

## 2022-05-07 NOTE — Progress Notes (Signed)
Progress Note   Subjective  Hospital Day #9 Chief Complaint:Elevated LFT's/ Bowel obstruction  Today, patient is seen with her family by her bedside.  She was actually able to tolerate a half of a biscuit this morning and tells me that he ate well.  Seems to be helping her tolerate her other medications.  She did just take a pain pill so she is falling asleep.  She has no acute complaints or concerns from the GI service.   Objective   Vital signs in last 24 hours: Temp:  [98 F (36.7 C)-98.1 F (36.7 C)] 98.1 F (36.7 C) (04/13 0751) Pulse Rate:  [68-88] 72 (04/13 0751) Resp:  [17-18] 18 (04/13 0751) BP: (118-133)/(73-80) 133/73 (04/13 0751) SpO2:  [95 %-98 %] 98 % (04/13 0751) Last BM Date : 05/05/22 General:    Acutely ill-appearing white female in NAD Heart:  Regular rate and rhythm; no murmurs Lungs: Respirations even and unlabored, lungs CTA bilaterally Abdomen:  Soft, nontender and nondistended. Normal bowel sounds. Psych:  Cooperative. Normal mood and affect.  Intake/Output from previous day: 04/12 0701 - 04/13 0700 In: 1543.6 [I.V.:1435.6; IV Piggyback:108] Out: -    Lab Results: Recent Labs    05/05/22 0324 05/06/22 0226 05/07/22 0421  WBC 5.0 5.2 3.4*  HGB 11.2* 10.7* 11.2*  HCT 33.4* 31.2* 34.0*  PLT 108* 115* 118*   BMET Recent Labs    05/05/22 0324 05/06/22 0226 05/07/22 0421  NA 133* 133* 132*  K 3.9 4.1 3.1*  CL 110 107 107  CO2 19* 19* 19*  GLUCOSE 115* 120* 95  BUN 10 8 7   CREATININE 0.37* 0.36* 0.40*  CALCIUM 7.0* 7.4* 7.3*      Latest Ref Rng & Units 05/07/2022    4:21 AM 05/06/2022    2:26 AM 05/05/2022    3:24 AM  Hepatic Function  Total Protein 6.5 - 8.1 g/dL 5.6  5.9  5.4   Albumin 3.5 - 5.0 g/dL 2.7  2.8  2.7   AST 15 - 41 U/L 71  78  84   ALT 0 - 44 U/L 193  211  222   Alk Phosphatase 38 - 126 U/L 687  756  804   Total Bilirubin 0.3 - 1.2 mg/dL 1.9  1.6  1.6      PT/INR Recent Labs    05/06/22 0226 05/07/22 0421   LABPROT 16.5* 16.6*  INR 1.3* 1.4*    Studies/Results: DG Abd 2 Views  Result Date: 05/06/2022 CLINICAL DATA:  Vomiting. EXAM: ABDOMEN - 2 VIEW COMPARISON:  05/05/2022 FINDINGS: Suture chains identified within the right lower quadrant of the abdomen. Bowel gas pattern appears nonobstructed. There are no dilated loops of large or small bowel. Gas and stool is noted within the colon up to the rectum. No signs of pneumoperitoneum. IMPRESSION: Nonobstructive bowel gas pattern. Electronically Signed   By: Signa Kell M.D.   On: 05/06/2022 07:30     Assessment / Plan:   Assessment: 1.  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  Partial small bowel obstruction in the setting of Crohn's and stage IV breast cancer: Followed by surgery, doing well, tolerating regular diet GI 3.  Metastatic breast cancer with leptomeningeal spread 4.  Multiple CVAs 5.  Epistaxis  Plan: 1.  Patient continues to feel better, will continue regular diet as tolerated 2.  Again as per  Dr. Tomasa Rand would likely recommend transitioning to Prednisone 40 mg p.o. daily and treating at that dose for 2 weeks then start with 10 mg taper every week (will need to follow liver enzymes to help guide taper), though liver enzymes did increase slightly overnight.  Will confirm with him when to start this transition.  We will likely sign off.  Please call us back with me if any further assistance.   LOS: 9 days   Unk Lightning  05/07/2022, 10:56 AM

## 2022-05-08 ENCOUNTER — Inpatient Hospital Stay (HOSPITAL_COMMUNITY): Payer: Commercial Managed Care - PPO

## 2022-05-08 DIAGNOSIS — G96198 Other disorders of meninges, not elsewhere classified: Secondary | ICD-10-CM | POA: Diagnosis not present

## 2022-05-08 DIAGNOSIS — K920 Hematemesis: Secondary | ICD-10-CM | POA: Diagnosis not present

## 2022-05-08 DIAGNOSIS — C50912 Malignant neoplasm of unspecified site of left female breast: Secondary | ICD-10-CM | POA: Diagnosis not present

## 2022-05-08 DIAGNOSIS — K716 Toxic liver disease with hepatitis, not elsewhere classified: Secondary | ICD-10-CM | POA: Diagnosis not present

## 2022-05-08 LAB — CBC WITH DIFFERENTIAL/PLATELET
Abs Immature Granulocytes: 0.06 10*3/uL (ref 0.00–0.07)
Basophils Absolute: 0 10*3/uL (ref 0.0–0.1)
Basophils Relative: 0 %
Eosinophils Absolute: 0 10*3/uL (ref 0.0–0.5)
Eosinophils Relative: 0 %
HCT: 30.1 % — ABNORMAL LOW (ref 36.0–46.0)
Hemoglobin: 10.4 g/dL — ABNORMAL LOW (ref 12.0–15.0)
Immature Granulocytes: 2 %
Lymphocytes Relative: 52 %
Lymphs Abs: 1.8 10*3/uL (ref 0.7–4.0)
MCH: 30.8 pg (ref 26.0–34.0)
MCHC: 34.6 g/dL (ref 30.0–36.0)
MCV: 89.1 fL (ref 80.0–100.0)
Monocytes Absolute: 0.6 10*3/uL (ref 0.1–1.0)
Monocytes Relative: 17 %
Neutro Abs: 1 10*3/uL — ABNORMAL LOW (ref 1.7–7.7)
Neutrophils Relative %: 29 %
Platelets: 124 10*3/uL — ABNORMAL LOW (ref 150–400)
RBC: 3.38 MIL/uL — ABNORMAL LOW (ref 3.87–5.11)
RDW: 22.2 % — ABNORMAL HIGH (ref 11.5–15.5)
WBC: 3.5 10*3/uL — ABNORMAL LOW (ref 4.0–10.5)
nRBC: 0 % (ref 0.0–0.2)

## 2022-05-08 LAB — COMPREHENSIVE METABOLIC PANEL
ALT: 178 U/L — ABNORMAL HIGH (ref 0–44)
AST: 71 U/L — ABNORMAL HIGH (ref 15–41)
Albumin: 2.6 g/dL — ABNORMAL LOW (ref 3.5–5.0)
Alkaline Phosphatase: 680 U/L — ABNORMAL HIGH (ref 38–126)
Anion gap: 4 — ABNORMAL LOW (ref 5–15)
BUN: 6 mg/dL (ref 6–20)
CO2: 23 mmol/L (ref 22–32)
Calcium: 8.3 mg/dL — ABNORMAL LOW (ref 8.9–10.3)
Chloride: 106 mmol/L (ref 98–111)
Creatinine, Ser: 0.5 mg/dL (ref 0.44–1.00)
GFR, Estimated: >60 mL/min (ref >60)
Glucose, Bld: 111 mg/dL — ABNORMAL HIGH (ref 70–99)
Potassium: 3.3 mmol/L — ABNORMAL LOW (ref 3.5–5.1)
Sodium: 133 mmol/L — ABNORMAL LOW (ref 135–145)
Total Bilirubin: 1.5 mg/dL — ABNORMAL HIGH (ref 0.3–1.2)
Total Protein: 5.3 g/dL — ABNORMAL LOW (ref 6.5–8.1)

## 2022-05-08 LAB — MAGNESIUM: Magnesium: 1.9 mg/dL (ref 1.7–2.4)

## 2022-05-08 LAB — PROTIME-INR
INR: 1.4 — ABNORMAL HIGH (ref 0.8–1.2)
Prothrombin Time: 17.2 seconds — ABNORMAL HIGH (ref 11.4–15.2)

## 2022-05-08 MED ORDER — POTASSIUM CHLORIDE 20 MEQ PO PACK
40.0000 meq | PACK | Freq: Once | ORAL | Status: AC
  Administered 2022-05-08: 40 meq via ORAL
  Filled 2022-05-08: qty 2

## 2022-05-08 MED ORDER — PROCHLORPERAZINE EDISYLATE 10 MG/2ML IJ SOLN
10.0000 mg | Freq: Two times a day (BID) | INTRAMUSCULAR | Status: DC | PRN
  Administered 2022-05-08 – 2022-05-09 (×2): 10 mg via INTRAVENOUS
  Filled 2022-05-08 (×2): qty 2

## 2022-05-08 MED ORDER — GADOBUTROL 1 MMOL/ML IV SOLN
9.0000 mL | Freq: Once | INTRAVENOUS | Status: AC | PRN
  Administered 2022-05-08: 9 mL via INTRAVENOUS

## 2022-05-08 MED ORDER — PROCHLORPERAZINE MALEATE 10 MG PO TABS
10.0000 mg | ORAL_TABLET | Freq: Two times a day (BID) | ORAL | Status: DC
  Administered 2022-05-09: 10 mg via ORAL
  Filled 2022-05-08 (×3): qty 1

## 2022-05-08 NOTE — Plan of Care (Signed)

## 2022-05-08 NOTE — Progress Notes (Signed)
PROGRESS NOTE        PATIENT DETAILS Name: Carla Clark Age: 37 y.o. Sex: female Date of Birth: 08-01-1985 Admit Date: 05/03/2022 Admitting Physician Clydie Braun, MD [id number], Bryon Lions, MD  Brief Summary: Patient is a 37 y.o.  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  Hospital course was complicated by epistaxis requiring ENT evaluation and nasal packing, transient SBO.  See below for further details.  Significant events: 4/4>> admit to [facility]  Significant studies: 4/4>> MRI brain: Possible leptomeningeal metastatic disease, multiple embolic infarcts. 4/5>> CT angio head/neck: No significant stenosis 4/5>> echo: EF 65-70% 4/10>> x-ray abdomen: Dilated loops of small bowel in the right hemiabdomen. 4/10>> CT abdomen/pelvis: SBO with transition point in the right lower quadrant-swirling of the mesentery.  Significant microbiology data: 4/4>> blood culture: No growth 4/4>> CSF culture: No growth  Procedures: 4/4>> fluoroscopy guided lumbar puncture  Consults: ENT Palliative Hematology/oncology Neurology Infection disease  Subjective: Thinks she has developed some right-sided mild facial weakness.  Patient feels that she also has some mild right body weakness as well.  Some vomiting.  Had a  episode of epistaxis yesterday-lasted around 5 minutes.  Objective: Vitals: Blood pressure (!) 136/91, pulse 98, temperature 98 F (36.7 C), resp. rate 18, height  (1.6 m), weight 51.7 kg, last menstrual period 02/19/2022, SpO2 96 %, currently breastfeeding.   Exam: Gen Exam:Alert awake-not in any distress HEENT:atraumatic, normocephalic Chest: B/L clear to auscultation anteriorly CVS:S1S2 regular Abdomen:soft non  tender, non distended Extremities:no edema Neurology: Slight right facial droop-nasolabial fold not as prominent on the right compared to the left. Skin: no rash  Pertinent Labs/Radiology:    Latest Ref Rng & Units 05/08/2022    4:05 AM 05/07/2022    4:21 AM 05/06/2022    2:26 AM  CBC  WBC 4.0 - 10.5 K/uL 3.5  3.4  5.2   Hemoglobin 12.0 - 15.0 g/dL 84.6  96.2  95.2   Hematocrit 36.0 - 46.0 % 30.1  34.0  31.2   Platelets 150 - 400 K/uL 124  118  115     Lab Results  Component Value Date   NA 133 (L) 05/08/2022   K 3.3 (L) 05/08/2022   CL 106 05/08/2022   CO2 23 05/08/2022      Assessment/Plan: Acute ischemic stroke Embolic in the setting of hypercoagulable state due to breast cancer Workup as above Anticoagulation stopped several days back-due to recurrent epistaxis-bowel obstruction requiring NG tube placement Seems to have developed some mild right facial weakness overnight-getting stat MRI brain.  Remains at risk for more embolic events-but this also could be from worsening leptomeningeal metastases.    Triple negative metastatic breast cancer with leptomeningeal metastases Oncology following Overall poor prognosis Has been started on Olaparib  Transaminitis Thought to be immune mediated due to [facility] Liver enzymes improved with steroids-switch to prednisone 40 mg daily x 14 days, followed by 10 mg taper every 7 days. GI will arrange for follow-up with Dr. Rhea Belton.   Continue to follow LFTs  Epistaxis S/p bilateral cauterization-with bilateral nasal packings-this was removed on 4/10 Continues to have epistaxis almost on a daily basis.  Anticoagulation was  discontinued on 4/10. Thankfully episodes are self-limited-and resolved with gentle pressure.  SBO Developed vomiting from 4/9-4/10-see the abdomen showed possible SBO Thankfully resolved with supportive care-did not even require NG tube.  Note-Lovenox was held in anticipation of patient requiring NG tube-in the  setting of recent epistaxis.  Vomiting Has had intermittent vomiting for the past several days-no evidence of SBO on x-ray abdomen 4/12 Suspect this may be related to her malignancy/And underlying medical issues Continue to follow closely-abdominal exam remains benign Continue antiemetics-will discuss with pharmacy to see if we can schedule her Compazine half an hour before she gets olaparib.  Hypokalemia Due to intermittent vomiting Replete/recheck  History of C. difficile colitis Plan was to continue oral vancomycin x 5 days after stopping Augmentin-4/09-however due to vomiting-this has not been discontinued.   No recurrence of diarrhea-watch closely.  Normocytic anemia Due to underlying malignancy S/p 2 units of PRBC on 4/6-follow CBC periodically  Hyponatremia Mild Follow.  GERD Pepcid  Abnormal EBV/CMV serology Evaluated by infectious disease-unclear significance.  Palliative care And DNR in place Patient desires to continue with current care including treatment modalities for now-goal is to return to home and live as long as possible with good quality of life, once her quality of life is no longer acceptable-she is open to hospice/comfort measures.  BMI: Estimated body mass index is 20.19 kg/m as calculated from the following:   Height as of this encounter:  (1.6 m).   Weight as of this encounter: 51.7 kg.   Code status:   Code Status: DNR   DVT Prophylaxis: SCD  Family Communication: Spouse at bedside   Disposition Plan: Status is: Inpatient Remains inpatient appropriate because: Severity of illness   Planned Discharge Destination:Home health   Diet: Diet Order             DIET SOFT Room service appropriate? Yes; Fluid consistency: Thin  Diet effective now                     Antimicrobial agents: Anti-infectives (From admission, onward)    Start     Dose/Rate Route Frequency Ordered Stop   05/02/22 2200  vancomycin (VANCOCIN)  capsule 125 mg  Status:  Discontinued        125 mg Oral 2 times daily 05/02/22 1345 05/06/22 0609   05/01/22 1100  amoxicillin-clavulanate (AUGMENTIN) 875-125 MG per tablet 1 tablet  Status:  Discontinued        1 tablet Oral Every 12 hours 05/01/22 1012 05/05/22 0624   04/29/22 1000  vancomycin (VANCOREADY) IVPB 750 mg/150 mL  Status:  Discontinued        750 mg 150 mL/hr over 60 Minutes Intravenous Every 12 hours 05/05/2022 1858 04/29/22 0954   04/29/22 0715  vancomycin (VANCOCIN) capsule 125 mg  Status:  Discontinued        125 mg Oral 3 times daily before meals & bedtime 04/29/22 0702 05/02/22 1345   04/29/22 0600  vancomycin (VANCOREADY) IVPB 750 mg/150 mL  Status:  Discontinued        750 mg 150 mL/hr over 60 Minutes Intravenous Every 12 hours 05/06/2022 1732 05/04/2022 1858   05/22/2022 1900  vancomycin (VANCOREADY) IVPB 1250 mg/250 mL        1,250 mg 166.7 mL/hr over 90 Minutes Intravenous  Once 05/09/2022 1841 05/01/2022 2300   05/01/2022 1706  ampicillin (OMNIPEN) 2 g in sodium chloride 0.9 % 100 mL IVPB  Status:  Discontinued  2 g 300 mL/hr over 20 Minutes Intravenous Every 4 hours 05/14/2022 1634 04/29/22 0954   05/22/2022 1645  cefTRIAXone (ROCEPHIN) 2 g in sodium chloride 0.9 % 100 mL IVPB  Status:  Discontinued        2 g 200 mL/hr over 30 Minutes Intravenous Every 12 hours 05/12/2022 1634 04/29/22 0954   05/16/2022 1645  vancomycin (VANCOREADY) IVPB 1250 mg/250 mL  Status:  Discontinued        1,250 mg 166.7 mL/hr over 90 Minutes Intravenous  Once 05/18/2022 1634 05/19/2022 1841        MEDICATIONS: Scheduled Meds:  Chlorhexidine Gluconate Cloth  6 each Topical Daily   docusate sodium  100 mg Oral BID   feeding supplement  237 mL Oral TID BM   lidocaine  1 Application Topical Once   methylPREDNISolone (SOLU-MEDROL) injection  60 mg Intravenous Daily   olaparib  300 mg Oral BID   saccharomyces boulardii  250 mg Oral BID   sodium chloride flush  10-40 mL Intracatheter Q12H    Continuous Infusions:  famotidine (PEPCID) IV 40 mg (05/08/22 0846)   lactated ringers 40 mL/hr at 05/07/22 1805   PRN Meds:.bacitracin, butalbital-acetaminophen-caffeine, diphenhydrAMINE **AND** [DISCONTINUED] prochlorperazine, HYDROmorphone (DILAUDID) injection, lidocaine-prilocaine, LORazepam, melatonin, oxyCODONE, polyethylene glycol, prochlorperazine, sodium chloride, sodium chloride flush   I have personally reviewed following labs and imaging studies  LABORATORY DATA: CBC: Recent Labs  Lab 05/03/22 0328 05/05/22 0324 05/06/22 0226 05/07/22 0421 05/08/22 0405  WBC 5.0 5.0 5.2 3.4* 3.5*  NEUTROABS 2.6 2.5 3.6 1.6* 1.0*  HGB 11.7* 11.2* 10.7* 11.2* 10.4*  HCT 35.5* 33.4* 31.2* 34.0* 30.1*  MCV 90.1 89.3 89.4 90.4 89.1  PLT 98* 108* 115* 118* 124*     Basic Metabolic Panel: Recent Labs  Lab 05/04/22 0420 05/05/22 0324 05/06/22 0226 05/07/22 0421 05/08/22 0405  NA 129* 133* 133* 132* 133*  K 3.8 3.9 4.1 3.1* 3.3*  CL 106 110 107 107 106  CO2 18* 19* 19* 19* 23  GLUCOSE 109* 115* 120* 95 111*  BUN 9 10 8 7 6   CREATININE 0.38* 0.37* 0.36* 0.40* 0.50  CALCIUM 7.4* 7.0* 7.4* 7.3* 8.3*  MG  --   --   --   --  1.9     GFR: Estimated Creatinine Clearance: 80.9 mL/min (by C-G formula based on SCr of 0.5 mg/dL).  Liver Function Tests: Recent Labs  Lab 05/03/22 0328 05/05/22 0324 05/06/22 0226 05/07/22 0421 05/08/22 0405  AST 141* 84* 78* 71* 71*  ALT 319* 222* 211* 193* 178*  ALKPHOS 1,018* 804* 756* 687* 680*  BILITOT 1.7* 1.6* 1.6* 1.9* 1.5*  PROT 6.1* 5.4* 5.9* 5.6* 5.3*  ALBUMIN 2.9* 2.7* 2.8* 2.7* 2.6*    No results for input(s): "LIPASE", "AMYLASE" in the last 168 hours. No results for input(s): "AMMONIA" in the last 168 hours.  Coagulation Profile: Recent Labs  Lab 05/04/22 0420 05/05/22 0324 05/06/22 0226 05/07/22 0421 05/08/22 0405  INR 1.1 1.3* 1.3* 1.4* 1.4*     Cardiac Enzymes: No results for input(s): "CKTOTAL", "CKMB",  "CKMBINDEX", "TROPONINI" in the last 168 hours.  BNP (last 3 results) No results for input(s): "PROBNP" in the last 8760 hours.  Lipid Profile: No results for input(s): "CHOL", "HDL", "LDLCALC", "TRIG", "CHOLHDL", "LDLDIRECT" in the last 72 hours.  Thyroid Function Tests: No results for input(s): "TSH", "T4TOTAL", "FREET4", "T3FREE", "THYROIDAB" in the last 72 hours.  Anemia Panel: No results for input(s): "VITAMINB12", "FOLATE", "FERRITIN", "TIBC", "IRON", "RETICCTPCT" in the last  72 hours.  Urine analysis:    Component Value Date/Time   COLORURINE STRAW (A) 03/07/2022 1630   APPEARANCEUR CLEAR 03/07/2022 1630   LABSPEC 1.031 (H) 03/07/2022 1630   PHURINE 7.0 03/07/2022 1630   GLUCOSEU NEGATIVE 03/07/2022 1630   GLUCOSEU NEGATIVE 08/24/2017 1211   HGBUR NEGATIVE 03/07/2022 1630   BILIRUBINUR NEGATIVE 03/07/2022 1630   BILIRUBINUR N 08/18/2015 1554   KETONESUR NEGATIVE 03/07/2022 1630   PROTEINUR NEGATIVE 03/07/2022 1630   UROBILINOGEN 0.2 08/24/2017 1211   NITRITE NEGATIVE 03/07/2022 1630   LEUKOCYTESUR NEGATIVE 03/07/2022 1630    Sepsis Labs: Lactic Acid, Venous    Component Value Date/Time   LATICACIDVEN 1.2 03/07/2022 1630    MICROBIOLOGY: Recent Results (from the past 240 hour(s))  CSF culture w Gram Stain     Status: None   Collection Time: 05/11/2022  2:22 PM   Specimen: CSF; Cerebrospinal Fluid  Result Value Ref Range Status   Specimen Description CSF  Final   Special Requests Immunocompromised  Final   Gram Stain   Final    WBC PRESENT,BOTH PMN AND MONONUCLEAR NO ORGANISMS SEEN CYTOSPIN SMEAR    Culture   Final    NO GROWTH 3 DAYS Performed at [facility], [address] [place], [place] [address]    Report Status 05/02/2022 FINAL  Final  Fungus Culture With Stain     Status: None (Preliminary result)   Collection Time: 05/08/2022  2:42 PM  Result Value Ref Range Status   Fungus Stain Final report  Final    Comment: (NOTE) Performed At: [facility] [address] [place], [place] [id number] Jolene Schimke MD [id number]    Fungus (Mycology) Culture PENDING  Incomplete   Fungal Source CSF  Final    Comment: Performed at [facility], [address] [place], [place] [address]  Fungus Culture Result     Status: None   Collection Time: 05/02/2022  2:42 PM  Result Value Ref Range Status   Result 1 Comment  Final    Comment: (NOTE) KOH/Calcofluor preparation:  no fungus observed. Performed At: [facility] [address] [place], [place] [id number] Jolene Schimke MD [id number]   Culture, blood (single) w Reflex to ID Panel     Status: None   Collection Time: 05/13/2022  6:23 PM   Specimen: BLOOD  Result Value Ref Range Status   Specimen Description BLOOD SITE NOT SPECIFIED  Final   Special Requests   Final    BOTTLES DRAWN AEROBIC AND ANAEROBIC Blood Culture adequate volume   Culture   Final    NO GROWTH 5 DAYS Performed at [facility], [address] [place], [place] [address]    Report Status 05/03/2022 FINAL  Final    RADIOLOGY STUDIES/RESULTS: No results found.   LOS: 10 days   Jeoffrey Massed, MD  [facility]    To contact the attending provider between 7A-7P or the covering provider during after hours 7P-7A, please log into the web site [email or web address] and access using universal [place] password for that web site. If you do not have the password, please call the hospital operator.  05/08/2022, 10:49 AM

## 2022-05-09 ENCOUNTER — Inpatient Hospital Stay: Payer: Commercial Managed Care - PPO

## 2022-05-09 ENCOUNTER — Inpatient Hospital Stay: Payer: Commercial Managed Care - PPO | Admitting: Hematology & Oncology

## 2022-05-09 DIAGNOSIS — I639 Cerebral infarction, unspecified: Secondary | ICD-10-CM | POA: Diagnosis not present

## 2022-05-09 DIAGNOSIS — R040 Epistaxis: Secondary | ICD-10-CM | POA: Diagnosis not present

## 2022-05-09 DIAGNOSIS — K716 Toxic liver disease with hepatitis, not elsewhere classified: Secondary | ICD-10-CM | POA: Diagnosis not present

## 2022-05-09 DIAGNOSIS — K56609 Unspecified intestinal obstruction, unspecified as to partial versus complete obstruction: Secondary | ICD-10-CM | POA: Diagnosis not present

## 2022-05-09 LAB — COMPREHENSIVE METABOLIC PANEL
ALT: 163 U/L — ABNORMAL HIGH (ref 0–44)
AST: 58 U/L — ABNORMAL HIGH (ref 15–41)
Albumin: 2.5 g/dL — ABNORMAL LOW (ref 3.5–5.0)
Alkaline Phosphatase: 569 U/L — ABNORMAL HIGH (ref 38–126)
Anion gap: 6 (ref 5–15)
BUN: 11 mg/dL (ref 6–20)
CO2: 22 mmol/L (ref 22–32)
Calcium: 8 mg/dL — ABNORMAL LOW (ref 8.9–10.3)
Chloride: 107 mmol/L (ref 98–111)
Creatinine, Ser: 0.45 mg/dL (ref 0.44–1.00)
GFR, Estimated: >60 mL/min (ref >60)
Glucose, Bld: 112 mg/dL — ABNORMAL HIGH (ref 70–99)
Potassium: 3.8 mmol/L (ref 3.5–5.1)
Sodium: 135 mmol/L (ref 135–145)
Total Bilirubin: 1.3 mg/dL — ABNORMAL HIGH (ref 0.3–1.2)
Total Protein: 5.3 g/dL — ABNORMAL LOW (ref 6.5–8.1)

## 2022-05-09 LAB — GLUCOSE, CAPILLARY: Glucose-Capillary: 146 mg/dL — ABNORMAL HIGH (ref 70–99)

## 2022-05-09 LAB — CBC WITH DIFFERENTIAL/PLATELET
Abs Immature Granulocytes: 0.05 10*3/uL (ref 0.00–0.07)
Basophils Absolute: 0 10*3/uL (ref 0.0–0.1)
Basophils Relative: 0 %
Eosinophils Absolute: 0 10*3/uL (ref 0.0–0.5)
Eosinophils Relative: 0 %
HCT: 30.2 % — ABNORMAL LOW (ref 36.0–46.0)
Hemoglobin: 10.2 g/dL — ABNORMAL LOW (ref 12.0–15.0)
Immature Granulocytes: 1 %
Lymphocytes Relative: 52 %
Lymphs Abs: 2.3 10*3/uL (ref 0.7–4.0)
MCH: 30.4 pg (ref 26.0–34.0)
MCHC: 33.8 g/dL (ref 30.0–36.0)
MCV: 89.9 fL (ref 80.0–100.0)
Monocytes Absolute: 0.7 10*3/uL (ref 0.1–1.0)
Monocytes Relative: 16 %
Neutro Abs: 1.4 10*3/uL — ABNORMAL LOW (ref 1.7–7.7)
Neutrophils Relative %: 31 %
Platelets: 144 10*3/uL — ABNORMAL LOW (ref 150–400)
RBC: 3.36 MIL/uL — ABNORMAL LOW (ref 3.87–5.11)
RDW: 22.3 % — ABNORMAL HIGH (ref 11.5–15.5)
WBC: 4.4 10*3/uL (ref 4.0–10.5)
nRBC: 0.7 % — ABNORMAL HIGH (ref 0.0–0.2)

## 2022-05-09 LAB — PROTIME-INR
INR: 1.2 (ref 0.8–1.2)
Prothrombin Time: 15.4 seconds — ABNORMAL HIGH (ref 11.4–15.2)

## 2022-05-09 MED ORDER — MORPHINE SULFATE (PF) 2 MG/ML IV SOLN: 1.0000 mg | INTRAVENOUS | Status: DC | PRN

## 2022-05-09 MED ORDER — SCOPOLAMINE 1 MG/3DAYS TD PT72
1.0000 | MEDICATED_PATCH | TRANSDERMAL | Status: DC
  Administered 2022-05-12 – 2022-05-15 (×2): 1.5 mg via TRANSDERMAL
  Filled 2022-05-09 (×2): qty 1

## 2022-05-09 MED ORDER — OLANZAPINE 5 MG PO TABS
10.0000 mg | ORAL_TABLET | Freq: Every day | ORAL | Status: DC
  Filled 2022-05-09: qty 2

## 2022-05-09 MED ORDER — APIXABAN 5 MG PO TABS
5.0000 mg | ORAL_TABLET | Freq: Two times a day (BID) | ORAL | Status: DC
  Filled 2022-05-09 (×2): qty 1

## 2022-05-09 MED ORDER — SCOPOLAMINE 1 MG/3DAYS TD PT72
1.0000 | MEDICATED_PATCH | TRANSDERMAL | Status: DC
  Administered 2022-05-09: 1.5 mg via TRANSDERMAL
  Filled 2022-05-09: qty 1

## 2022-05-09 MED ORDER — MORPHINE SULFATE (PF) 2 MG/ML IV SOLN
2.0000 mg | INTRAVENOUS | Status: DC | PRN
  Administered 2022-05-10 – 2022-05-11 (×12): 2 mg via INTRAVENOUS
  Filled 2022-05-09 (×14): qty 1

## 2022-05-09 MED ORDER — PROCHLORPERAZINE EDISYLATE 10 MG/2ML IJ SOLN
10.0000 mg | Freq: Two times a day (BID) | INTRAMUSCULAR | Status: DC
  Administered 2022-05-09: 10 mg via INTRAVENOUS
  Filled 2022-05-09 (×2): qty 2

## 2022-05-09 MED ORDER — LORAZEPAM 2 MG/ML IJ SOLN
1.0000 mg | INTRAMUSCULAR | Status: DC | PRN
  Administered 2022-05-12 – 2022-05-17 (×18): 1 mg via INTRAVENOUS
  Filled 2022-05-09 (×19): qty 1

## 2022-05-09 MED ORDER — PREDNISONE 20 MG PO TABS
40.0000 mg | ORAL_TABLET | Freq: Every day | ORAL | Status: DC
  Filled 2022-05-09 (×2): qty 2

## 2022-05-09 MED ORDER — PROCHLORPERAZINE MALEATE 10 MG PO TABS
10.0000 mg | ORAL_TABLET | Freq: Two times a day (BID) | ORAL | Status: DC
  Filled 2022-05-09 (×2): qty 1

## 2022-05-09 NOTE — Progress Notes (Signed)
Patient could take only one pill of Olaparib 150mg (Chemo medicine).Rest 150mg (1 pill) was returned back to pharmacy.

## 2022-05-09 NOTE — Progress Notes (Signed)
[name] had a relatively quiet weekend.  There is still some nausea.  She has some emesis this morning.  She is still having some neurological changes.  She had an MRI of the brain yesterday which looked relatively stable.    There is no obvious bowel obstruction now.  Again she is starting to eat better.  She has had embolic events into the brain.  We really have to get her back on anticoagulation.  I would try her on an oral agent.  Will try her on Eliquis.  I would not give her a bolus Eliquis dose.  She really needs physical therapy.  She does have some weakness.  She is on the olaparib now.  I think she is tolerating this okay.  Her LFTs continue to improve.  Bilirubin is 1.3.  SGPT 163 SGOT 58.  Alkaline phosphatase 569.  Her white count is 4.4.  Hemoglobin 10.2.  Platelet count 144,000.  She is eating better.  She is on some steroids.  I know that Gastroenterology is decreasing her steroid dosing.  She really would like to go home.  I does worry about her physical state.  She really needs to have a lot of physical therapy.  She is somewhat deconditioned.  She did have some low potassium over the weekend.  Potassium today is 3.8.  Her BUN is 11 creatinine 0.45.  Calcium is 8 with an albumin of 2.5.  There is no epistaxis right now.  Again, with anticoagulation will get have to be very cautious with this.  She has had no fever.  Her vital signs are temperature of 98.  Pulse 65.  Blood pressure 120/67.  Oxygen saturation is 96%.  Head and neck exam shows no ocular or oral lesions.  Extraocular muscles are intact.  There is no adenopathy in the neck.  Lungs are clear bilaterally.  Cardiac exam regular rate and rhythm.  Abdomen is soft.  Bowel sounds are present.  There is no abdominal mass.  There is no fluid wave.  There is no palpable liver or spleen tip.  Extremities shows decent strength in the lower extremities.  There may be a little bit of weakness over on the right side.  She has  decent range of motion of her joints.  Neurological exam is nonfocal.  There is still some issues that are being addressed.  Her embolic strokes are a problem.  Again we will try her on some Eliquis and hopefully she will have any nasal bleeding from this.  She has a leptomeningeal disease.  I would like to hope that the olaparib will penetrate the CNS to help control this.  She is physical therapy daily.  I know that it is tough to have physical therapy daily.  Will have to see about the nausea and vomiting.  I know that the staff upon [address] are doing a great job with her.  Christin Bach, MD  Jeri Modena 17:14

## 2022-05-09 NOTE — Progress Notes (Signed)
OT Cancellation Note  Patient Details Name: Carla Clark MRN: [id number] DOB: 06-Mar-1985   Cancelled Treatment:    Reason Eval/Treat Not Completed: Patient's level of consciousness (Pt sleeping upon arrival, family reporting it has been a "bad day," pt nearly unarousable and reporting she has new BLE weakness and incontinence. OT to f/u when pt is able to participate)  Donia Pounds 05/09/2022, 4:35 PM

## 2022-05-09 NOTE — Progress Notes (Signed)
PROGRESS NOTE        PATIENT DETAILS Name: Carla Clark Age: 37 y.o. Sex: female Date of Birth: Nov 16, 1985 Admit Date: 05/06/2022 Admitting Physician Clydie Braun, MD ZOX:[id number], Bryon Lions, MD  Brief Summary: Patient is a 37 y.o.  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  Hospital course was complicated by epistaxis requiring ENT evaluation and nasal packing, transient SBO.  See below for further details.  Significant events: 4/4>> admit to [facility]  Significant studies: 4/4>> MRI brain: Possible leptomeningeal metastatic disease, multiple embolic infarcts. 4/5>> CT angio head/neck: No significant stenosis 4/5>> echo: EF 65-70% 4/10>> x-ray abdomen: Dilated loops of small bowel in the right hemiabdomen. 4/10>> CT abdomen/pelvis: SBO with transition point in the right lower quadrant-swirling of the mesentery. 4/14>> MRI brain: No substantial change in multifocal small areas of restricted diffusion in frontoparietal lobes.  No evidence of new abnormality.  Significant microbiology data: 4/4>> blood culture: No growth 4/4>> CSF culture: No growth  Procedures: 4/4>> fluoroscopy guided lumbar puncture  Consults: ENT Palliative Hematology/oncology Neurology Infection disease  Subjective: Minimal vomiting this morning x 1 episode.  No epistaxis overnight.  Mild weakness in the right face appears unchanged.  Objective: Vitals: Blood pressure 120/67, pulse 65, temperature 98 F (36.7 C), temperature source Oral, resp. rate 12, height  (1.6 m), weight 49.1 kg, SpO2 96 %, currently breastfeeding.   Exam: Gen Exam:Alert awake-not in any distress HEENT:atraumatic, normocephalic Chest: B/L clear to auscultation  anteriorly CVS:S1S2 regular Abdomen:soft non tender, non distended Extremities:no edema Neurology: Mild weakness in the right facial area appears unchanged. Skin: no rash  Pertinent Labs/Radiology:    Latest Ref Rng & Units 05/09/2022    5:23 AM 05/08/2022    4:05 AM 05/07/2022    4:21 AM  CBC  WBC 4.0 - 10.5 K/uL 4.4  3.5  3.4   Hemoglobin 12.0 - 15.0 g/dL 54.0  98.1  19.1   Hematocrit 36.0 - 46.0 % 30.2  30.1  34.0   Platelets 150 - 400 K/uL 144  124  118     Lab Results  Component Value Date   NA 135 05/09/2022   K 3.8 05/09/2022   CL 107 05/09/2022   CO2 22 05/09/2022      Assessment/Plan: Acute ischemic stroke Embolic in the setting of hypercoagulable state due to breast cancer Workup as above Anticoagulation stopped 4/10 due to recurrent epistaxis/possible needing NG tube placement-oncology starting Eliquis today. MRI brain 4/14 without any new strokes-suspect right facial weakness is likely due to leptomeningeal involvement.    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 GI will arrange for follow-up with Dr. Rhea Belton.   Continue to follow LFTs  Epistaxis S/p bilateral cauterization-with bilateral nasal packings-this was removed on 4/10 Continues to have mild epistaxis almost on a daily basis even without anticoagulation (Lovenox discontinued 4/10).  Thankfully these episodes are self-limited/resolved with gentle pressure. Watch closely-as  Eliquis being started 4/15.  SBO Developed vomiting from 4/9-4/10-see the abdomen showed possible SBO Thankfully resolved with supportive care-did not even require NG tube.  Note-Lovenox was held in anticipation of patient requiring NG tube-in  the setting of recent epistaxis.  Vomiting Has had mild intermittent vomiting almost on a daily basis for the past several days.  Abdominal exam appears benign.  No evidence of SBO on x-rays. Suspicion that this is related to her malignancy with leptomeningeal spread-and multiple medications including olaparib.   Have started Compazine twice daily-have asked RN to see if we can time it half an hour before she gets a lot better. Continue supportive care.  Hypokalemia Due to intermittent vomiting Repleted.  History of C. difficile colitis Plan was to continue oral vancomycin x 5 days after stopping Augmentin-4/09-however due to vomiting-this has not been discontinued.   No recurrence of diarrhea-watch closely.  Normocytic anemia Due to underlying malignancy S/p 2 units of PRBC on 4/6-follow CBC periodically  Hyponatremia Mild Follow.  GERD Pepcid  Abnormal EBV/CMV serology Evaluated by infectious disease-unclear significance.  Debility/deconditioning PT/OT Home health on discharge.  Palliative care And DNR in place Patient desires to continue with current care including treatment modalities for now-goal is to return to home and live as long as possible with good quality of life, once her quality of life is no longer acceptable-she is open to hospice/comfort measures.  BMI: Estimated body mass index is 19.17 kg/m as calculated from the following:   Height as of this encounter:  (1.6 m).   Weight as of this encounter: 49.1 kg.   Code status:   Code Status: DNR   DVT Prophylaxis: SCD  Family Communication: Spouse at bedside   Disposition Plan: Status is: Inpatient Remains inpatient appropriate because: Severity of illness   Planned Discharge Destination:Home health   Diet: Diet Order             DIET SOFT Room service appropriate? Yes; Fluid consistency: Thin  Diet effective now                     Antimicrobial agents: Anti-infectives (From  admission, onward)    Start     Dose/Rate Route Frequency Ordered Stop   05/02/22 2200  vancomycin (VANCOCIN) capsule 125 mg  Status:  Discontinued        125 mg Oral 2 times daily 05/02/22 1345 05/06/22 0609   05/01/22 1100  amoxicillin-clavulanate (AUGMENTIN) 875-125 MG per tablet 1 tablet  Status:  Discontinued        1 tablet Oral Every 12 hours 05/01/22 1012 05/05/22 0624   04/29/22 1000  vancomycin (VANCOREADY) IVPB 750 mg/150 mL  Status:  Discontinued        750 mg 150 mL/hr over 60 Minutes Intravenous Every 12 hours 04/27/2022 1858 04/29/22 0954   04/29/22 0715  vancomycin (VANCOCIN) capsule 125 mg  Status:  Discontinued        125 mg Oral 3 times daily before meals & bedtime 04/29/22 0702 05/02/22 1345   04/29/22 0600  vancomycin (VANCOREADY) IVPB 750 mg/150 mL  Status:  Discontinued        750 mg 150 mL/hr over 60 Minutes Intravenous Every 12 hours 05/18/2022 1732 05/10/2022 1858   05/12/2022 1900  vancomycin (VANCOREADY) IVPB 1250 mg/250 mL        1,250 mg 166.7 mL/hr over 90 Minutes Intravenous  Once 05/01/2022 1841 05/15/2022 2300   05/23/2022 1706  ampicillin (OMNIPEN) 2 g  in sodium chloride 0.9 % 100 mL IVPB  Status:  Discontinued        2 g 300 mL/hr over 20 Minutes Intravenous Every 4 hours 05/11/2022 1634 04/29/22 0954   05/24/2022 1645  cefTRIAXone (ROCEPHIN) 2 g in sodium chloride 0.9 % 100 mL IVPB  Status:  Discontinued        2 g 200 mL/hr over 30 Minutes Intravenous Every 12 hours 05/23/2022 1634 04/29/22 0954   05/16/2022 1645  vancomycin (VANCOREADY) IVPB 1250 mg/250 mL  Status:  Discontinued        1,250 mg 166.7 mL/hr over 90 Minutes Intravenous  Once 05/19/2022 1634 05/03/2022 1841        MEDICATIONS: Scheduled Meds:  apixaban  5 mg Oral BID   Chlorhexidine Gluconate Cloth  6 each Topical Daily   docusate sodium  100 mg Oral BID   feeding supplement  237 mL Oral TID BM   lidocaine  1 Application Topical Once   methylPREDNISolone (SOLU-MEDROL) injection  60 mg Intravenous  Daily   OLANZapine  10 mg Oral QHS   olaparib  300 mg Oral BID   prochlorperazine  10 mg Oral BID   saccharomyces boulardii  250 mg Oral BID   sodium chloride flush  10-40 mL Intracatheter Q12H   Continuous Infusions:  famotidine (PEPCID) IV 40 mg (05/08/22 2143)   lactated ringers 40 mL/hr at 05/08/22 1138   PRN Meds:.bacitracin, butalbital-acetaminophen-caffeine, diphenhydrAMINE **AND** [DISCONTINUED] prochlorperazine, HYDROmorphone (DILAUDID) injection, lidocaine-prilocaine, LORazepam, melatonin, oxyCODONE, polyethylene glycol, prochlorperazine, sodium chloride, sodium chloride flush   I have personally reviewed following labs and imaging studies  LABORATORY DATA: CBC: Recent Labs  Lab 05/05/22 0324 05/06/22 0226 05/07/22 0421 05/08/22 0405 05/09/22 0523  WBC 5.0 5.2 3.4* 3.5* 4.4  NEUTROABS 2.5 3.6 1.6* 1.0* 1.4*  HGB 11.2* 10.7* 11.2* 10.4* 10.2*  HCT 33.4* 31.2* 34.0* 30.1* 30.2*  MCV 89.3 89.4 90.4 89.1 89.9  PLT 108* 115* 118* 124* 144*     Basic Metabolic Panel: Recent Labs  Lab 05/05/22 0324 05/06/22 0226 05/07/22 0421 05/08/22 0405 05/09/22 0523  NA 133* 133* 132* 133* 135  K 3.9 4.1 3.1* 3.3* 3.8  CL 110 107 107 106 107  CO2 19* 19* 19* 23 22  GLUCOSE 115* 120* 95 111* 112*  BUN 10 8 7 6 11   CREATININE 0.37* 0.36* 0.40* 0.50 0.45  CALCIUM 7.0* 7.4* 7.3* 8.3* 8.0*  MG  --   --   --  1.9  --      GFR: Estimated Creatinine Clearance: 76.8 mL/min (by C-G formula based on SCr of 0.45 mg/dL).  Liver Function Tests: Recent Labs  Lab 05/05/22 0324 05/06/22 0226 05/07/22 0421 05/08/22 0405 05/09/22 0523  AST 84* 78* 71* 71* 58*  ALT 222* 211* 193* 178* 163*  ALKPHOS 804* 756* 687* 680* 569*  BILITOT 1.6* 1.6* 1.9* 1.5* 1.3*  PROT 5.4* 5.9* 5.6* 5.3* 5.3*  ALBUMIN 2.7* 2.8* 2.7* 2.6* 2.5*    No results for input(s): "LIPASE", "AMYLASE" in the last 168 hours. No results for input(s): "AMMONIA" in the last 168 hours.  Coagulation  Profile: Recent Labs  Lab 05/05/22 0324 05/06/22 0226 05/07/22 0421 05/08/22 0405 05/09/22 0523  INR 1.3* 1.3* 1.4* 1.4* 1.2     Cardiac Enzymes: No results for input(s): "CKTOTAL", "CKMB", "CKMBINDEX", "TROPONINI" in the last 168 hours.  BNP (last 3 results) No results for input(s): "PROBNP" in the last 8760 hours.  Lipid Profile: No results for input(s): "CHOL", "HDL", "  LDLCALC", "TRIG", "CHOLHDL", "LDLDIRECT" in the last 72 hours.  Thyroid Function Tests: No results for input(s): "TSH", "T4TOTAL", "FREET4", "T3FREE", "THYROIDAB" in the last 72 hours.  Anemia Panel: No results for input(s): "VITAMINB12", "FOLATE", "FERRITIN", "TIBC", "IRON", "RETICCTPCT" in the last 72 hours.  Urine analysis:    Component Value Date/Time   COLORURINE STRAW (A) 03/07/2022 1630   APPEARANCEUR CLEAR 03/07/2022 1630   LABSPEC 1.031 (H) 03/07/2022 1630   PHURINE 7.0 03/07/2022 1630   GLUCOSEU NEGATIVE 03/07/2022 1630   GLUCOSEU NEGATIVE 08/24/2017 1211   HGBUR NEGATIVE 03/07/2022 1630   BILIRUBINUR NEGATIVE 03/07/2022 1630   BILIRUBINUR N 08/18/2015 1554   KETONESUR NEGATIVE 03/07/2022 1630   PROTEINUR NEGATIVE 03/07/2022 1630   UROBILINOGEN 0.2 08/24/2017 1211   NITRITE NEGATIVE 03/07/2022 1630   LEUKOCYTESUR NEGATIVE 03/07/2022 1630    Sepsis Labs: Lactic Acid, Venous    Component Value Date/Time   LATICACIDVEN 1.2 03/07/2022 1630    MICROBIOLOGY: No results found for this or any previous visit (from the past 240 hour(s)).   RADIOLOGY STUDIES/RESULTS: MR BRAIN W WO CONTRAST  Result Date: 05/08/2022 CLINICAL DATA:  Neuro deficit, acute, stroke suspected Brain metastases, assess treatment response EXAM: MRI HEAD WITHOUT AND WITH CONTRAST TECHNIQUE: Multiplanar, multiecho pulse sequences of the brain and surrounding structures were obtained without and with intravenous contrast. CONTRAST:  83mL GADAVIST GADOBUTROL 1 MMOL/ML IV SOLN COMPARISON:  MRI head April 28, 2022. FINDINGS:  Brain: No substantial change in multifocal small areas of restricted diffusion involving frontoparietal lobes bilaterally with similar mild enhancement involving the right perirolandic and left medial parietal cortex. No evidence of new/interval enhancement or acute/interval infarct. No midline shift, acute hemorrhage or hydrocephalus. Chronic microhemorrhage in right parietal lobe. Vascular: Major arterial flow voids are maintained at the skull base. Skull and upper cervical spine: Normal marrow signal. Sinuses/Orbits: Negative. Other: No mastoid effusions. IMPRESSION: 1. No substantial change in multifocal small areas of restricted diffusion involving frontoparietal lobes bilaterally with similar mild enhancement involving the right perirolandic and left medial parietal cortex. As discussed on the recent prior, these most likely represent enhancing acute/subacute infarcts; however, a follow-up MRI with contrast in approximately 6-7 weeks is recommended to ensure expected evolution and exclude leptomeningeal metastatic disease. 2. No evidence of interval/new abnormality. Electronically Signed   By: Feliberto Harts M.D.   On: 05/08/2022 15:24     LOS: 11 days   Jeoffrey Massed, MD  [facility]    To contact the attending provider between 7A-7P or the covering provider during after hours 7P-7A, please log into the web site [email or web address] and access using universal [place] password for that web site. If you do not have the password, please call the hospital operator.  05/09/2022, 8:28 AM

## 2022-05-09 NOTE — Progress Notes (Signed)
I had a long talk with the patient and her family tonight.  The patient was very somnolent.  I do not believe she understood what we were really talking about for the most part.  Her husband and her mother was with this.  I think it is clear that the leptomeningeal disease is starting to cause significant brain dysfunction.  I think the fact that she has urinary retention is significant.  The somnolence along with the leg weakness I think all go along with her leptomeningeal disease.  I think that the nausea and vomiting that she had is also reflective of the meningeal disease.  I do not believe that putting her through intrathecal chemotherapy is going to affect her quality of life in any meaningful way.  As such, I would not recommend that we pursue intrathecal chemotherapy on her.  We do have her on olaparib.  I know this does have some CNS penetration but I just do not believe that she we will be able to benefit from this.  I just want to make sure that she has comfort, respect and dignity.  I know it is incredibly difficult knowing that she has 2 young children.  I think that we are going to have to get [facility] involved so they can help with her husband and explain to her children what has happened to their mom.  She is incredibly somnolent.  She opens her eyes a little bit.  She talks a little bit and then goes right back to sleep.  I do not think that we need to do any further lab work on her.  I do still want to cause any aggravation for her right now.  I want to make sure that we have pain medicine and Ativan available for her in case she does have any issues with agitation.  I do not think we have to keep her on blood thinner.  I am going to stop the oral steroids on her.  I may want to continue some low-dose IV Decadron to help prevent any type of seizure from any kind of brain swelling.  She has fought incredibly hard.  This disease has just been so  variable and.  I know she has had a tough time with chemotherapy.  I realize that the chemotherapy has helped a little bit but yet she really has had a poor quality of life during her treatment.  I know that she has followed hard to be able to be with her children.  Again, she really has had difficulties with treatment.  This is truly the "dark side" of what we do and what we see.  I just want her to have comfort, respect and dignity.  She deserves that.  Her family deserves that also.  I have been as honest as I can be them regarding this situation and her rapid downhill course.  I just hate that we have this situation.  She has done everything we have asked her to do.  She has had incredible support from her family.  We just have come up against a malignancy that has been incredibly virulent and has found a way to attack her and eventually cause her body to shut down.  I talked to her family as to how I thought everything will go.  I think that she will start to have some pauses with her breathing.  I think her heart will begin to slow down.  Eventually, she will pass  on to [place].  She does have a very strong faith.  Her family has a very strong faith.  I know her father passed away a little under a year ago from brain cancer.  As such, this is really affecting her mom.  I know that she has gotten incredible care from everybody on [address]  I know this has been an incredibly complex situation.  However, their compassion that the staff has shown is commendable.  I would be surprised if she made it more than 2 days.  She really has regressed in a matter of 24 hours.  She is a DO NOT RESUSCITATE which I fully agree with.  Again, her family is in agreement with our change in care and focusing now on her comfort, respect, and dignity.   Christin Bach, MD  2 Timothy 4:16-18

## 2022-05-09 NOTE — Progress Notes (Signed)
PT Cancellation Note  Patient Details Name: Carla Clark MRN: [id number] DOB: 16-Nov-1985   Cancelled Treatment:    Reason Eval/Treat Not Completed: Other (comment).  Has received pain meds and is sleeping, retry at another time.   Ivar Drape 05/09/2022, 3:11 PM  Samul Dada, PT PhD Acute Rehab Dept. Number: [facility] [id number] and [facility] [phone]

## 2022-05-09 NOTE — Progress Notes (Signed)
This chaplain is present for a brief check in with the Pt. and family. The chaplain understands the Pt. husband-David is requesting assistance with Pt. pain from the RN and a revisit at another time.  Chaplain Stephanie Acre [phone]

## 2022-05-09 NOTE — Progress Notes (Signed)
Evaluated patient has she was hard to arouse-she had received Dilaudid 1 mg IV for generalized pain around 1358 hrs.  On my exam-sleeping comfortably-but easily arouses to gentle verbal stimuli.  Said hi to me.  Easily protecting airway-no secretion issues.  However on exam she does appear to have a distended bladder  Will stop Dilaudid and switch to morphine instead Have asked RN to BladderScan and do in/out catheterization x 1 for now.  Will continue to follow closely Sister/spouse updated at bedside.

## 2022-05-10 ENCOUNTER — Encounter: Payer: Commercial Managed Care - PPO | Admitting: Family Medicine

## 2022-05-10 ENCOUNTER — Encounter: Payer: Self-pay | Admitting: *Deleted

## 2022-05-10 DIAGNOSIS — I639 Cerebral infarction, unspecified: Secondary | ICD-10-CM | POA: Diagnosis not present

## 2022-05-10 DIAGNOSIS — K716 Toxic liver disease with hepatitis, not elsewhere classified: Secondary | ICD-10-CM | POA: Diagnosis not present

## 2022-05-10 DIAGNOSIS — K56609 Unspecified intestinal obstruction, unspecified as to partial versus complete obstruction: Secondary | ICD-10-CM | POA: Diagnosis not present

## 2022-05-10 DIAGNOSIS — R040 Epistaxis: Secondary | ICD-10-CM | POA: Diagnosis not present

## 2022-05-10 MED ORDER — PROCHLORPERAZINE 25 MG RE SUPP: 25.0000 mg | Freq: Two times a day (BID) | RECTAL | Status: DC | PRN

## 2022-05-10 MED ORDER — ACETAMINOPHEN 650 MG RE SUPP: 650.0000 mg | Freq: Four times a day (QID) | RECTAL | Status: DC | PRN

## 2022-05-10 MED ORDER — HALOPERIDOL LACTATE 2 MG/ML PO CONC: 0.5000 mg | ORAL | Status: DC | PRN

## 2022-05-10 MED ORDER — ACETAMINOPHEN 325 MG PO TABS: 650.0000 mg | ORAL_TABLET | Freq: Four times a day (QID) | ORAL | Status: DC | PRN

## 2022-05-10 MED ORDER — POLYVINYL ALCOHOL 1.4 % OP SOLN
1.0000 [drp] | Freq: Four times a day (QID) | OPHTHALMIC | Status: DC | PRN
  Administered 2022-05-13 – 2022-05-16 (×6): 1 [drp] via OPHTHALMIC
  Filled 2022-05-10: qty 15

## 2022-05-10 MED ORDER — PROCHLORPERAZINE MALEATE 10 MG PO TABS: 10.0000 mg | ORAL_TABLET | Freq: Four times a day (QID) | ORAL | Status: DC | PRN

## 2022-05-10 MED ORDER — PROCHLORPERAZINE EDISYLATE 10 MG/2ML IJ SOLN
10.0000 mg | Freq: Two times a day (BID) | INTRAMUSCULAR | Status: DC | PRN
  Administered 2022-05-10: 10 mg via INTRAVENOUS
  Filled 2022-05-10 (×3): qty 2

## 2022-05-10 MED ORDER — HALOPERIDOL LACTATE 5 MG/ML IJ SOLN: 0.5000 mg | INTRAMUSCULAR | Status: DC | PRN

## 2022-05-10 MED ORDER — HALOPERIDOL 1 MG PO TABS: 0.5000 mg | ORAL_TABLET | ORAL | Status: DC | PRN

## 2022-05-10 NOTE — Progress Notes (Signed)
OT Cancellation Note  Patient Details Name: Carla Clark MRN: [id number] DOB: 02/19/1985   Cancelled Treatment:    Reason Eval/Treat Not Completed: Other (comment) Noted per chart, pt with significant decline and transitioned to comfort care. OT will sign off.   Lorre Munroe 05/10/2022, 8:50 AM

## 2022-05-10 NOTE — Progress Notes (Signed)
PT Cancellation Note  Patient Details Name: MIRAKLE MEOLA MRN: [id number] DOB: Mar 10, 1985   Cancelled Treatment:    Reason Eval/Treat Not Completed: Other (comment) Noted per chart, pt with significant decline and transitioned to comfort care. PT will sign off at this time.   Lenora Boys. PTA Acute Rehabilitation Services Office: [phone]    Catalina Antigua 05/10/2022, 9:16 AM

## 2022-05-10 NOTE — Progress Notes (Signed)
Sister called PMT requesting information on what to expect as comfort care progresses. Reviewed information from [facility] booklet with family at bedside. Emotional/ spiritual support provided.  Ellysa Parrack MS Ed.S, RN Palliative Medicine Team

## 2022-05-10 NOTE — Progress Notes (Signed)
Overall, Ms. Behrle had a quiet night.  A lot of her family came in to be with her.  I am so happy that they were able to come in.  I know this is a very difficult time.  She still is sleeping most of the time.  She is not eating.  I really think we can cut back on the IV fluid as I do not think she needs to have all the IV fluid as this would just cause third spacing.  Her 2 children also came up.  She wanted them to come up and see her.  I know this must been very challenging and heartbreaking.  She is not hurting.  She is on morphine every hour if needed.  I offer the possibility of a morphine infusion if necessary.  We really have cut back her medications.  She is just on what I think will be necessary for her.  I would have to think that her prognosis is not can be more than 2 days.  She really has declined.  Again she is comfortable.  She does not have any problem with seizure activity.  I do not put a Foley catheter as of yet.  I do not think there is any problems with distention or abdominal pain.  Again thankfully, she does not have any nausea or vomiting.  I kept the scopolamine patch on her.  Again I hate that we are in this situation now.  Leptomeningeal disease is incredibly difficult to treat and there really is no good, consistent therapy for this.  I just want to make sure that she, again, has comfort, respect and dignity.  I know that her family coming in certainly has been encouragement.  I know that this has not been easy for them.  She is young.  She just has a very aggressive malignancy.  I am sure that this is being driven by the BRCA gene which she carries.  Again, our goal is to make sure that she has comfort.  I would expect that she will not make it through Wednesday, or Thursday.  I know the staff [facility] [address] have done a fantastic job with her.  I know that the staff have been so kind and so compassionate.  I know that they will all help Ms. Lusty and her family  during this incredibly difficult time.  Christin Bach, MD  [facility] [phone]

## 2022-05-10 NOTE — Progress Notes (Signed)
Patient continues to be hospitalized. She is now comfort care and likely pass in the hospital. Will discontinue navigation at this time.   Oncology Nurse Navigator Documentation     05/10/2022    8:45 AM  Oncology Nurse Navigator Flowsheets  Navigation Complete Date: 05/10/2022  Post Navigation: Continue to Follow Patient? No  Reason Not Navigating Patient: Pharmacologist Encounter Type Appt/Treatment Plan Review  Time Spent with Patient 15

## 2022-05-10 NOTE — Progress Notes (Signed)
PROGRESS NOTE        PATIENT DETAILS Name: Carla Clark Age: 37 y.o. Sex: female Date of Birth: 1985/02/05 Admit Date: 05/22/2022 Admitting Physician Clydie Braun, MD [id number], Bryon Lions, MD  Brief Summary: Patient is a 37 y.o.  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  Hospital course was complicated by epistaxis requiring ENT evaluation and nasal packing, transient SBO.  See below for further details.  Significant events: 4/4>> admit to [facility] 4/15>> worsening encephalopathy-transition to full comfort measures.  Significant studies: 4/4>> MRI brain: Possible leptomeningeal metastatic disease, multiple embolic infarcts. 4/5>> CT angio head/neck: No significant stenosis 4/5>> echo: EF 65-70% 4/10>> x-ray abdomen: Dilated loops of small bowel in the right hemiabdomen. 4/10>> CT abdomen/pelvis: SBO with transition point in the right lower quadrant-swirling of the mesentery. 4/14>> MRI brain: No substantial change in multifocal small areas of restricted diffusion in frontoparietal lobes.  No evidence of new abnormality.  Significant microbiology data: 4/4>> blood culture: No growth 4/4>> CSF culture: No growth  Procedures: 4/4>> fluoroscopy guided lumbar puncture  Consults: ENT Palliative Hematology/oncology Neurology Infection disease  Subjective: Barely responsive-Per spouse-has woken up a couple of times overnight-vomited-has been complaining of pain.  Objective: Vitals: Blood pressure 121/84, pulse 93, temperature 98.2 F (36.8 C), temperature source Oral, resp. rate 19, height  (1.6 m), weight 49.1 kg, SpO2 96 %, currently breastfeeding.   Exam: Gen Exam: Unresponsive  HEENT:atraumatic,  normocephalic Chest: B/L clear to auscultation anteriorly CVS:S1S2 regular Abdomen:soft non distended Extremities:no edema  Pertinent Labs/Radiology:    Latest Ref Rng & Units 05/09/2022    5:23 AM 05/08/2022    4:05 AM 05/07/2022    4:21 AM  CBC  WBC 4.0 - 10.5 K/uL 4.4  3.5  3.4   Hemoglobin 12.0 - 15.0 g/dL 95.6  21.3  08.6   Hematocrit 36.0 - 46.0 % 30.2  30.1  34.0   Platelets 150 - 400 K/uL 144  124  118     Lab Results  Component Value Date   NA 135 05/09/2022   K 3.8 05/09/2022   CL 107 05/09/2022   CO2 22 05/09/2022      Assessment/Plan: Acute ischemic stroke Embolic in the setting of hypercoagulable state due to breast cancer Workup as above Briefly on anticoagulation-complicated by bowel obstruction requiring NG tube placement (although NG tube was never placed) intermittent epistaxis.  Eliquis resumed on 4/14-but patient worsened and developed encephalopathy-and subsequently transition to full comfort measures.  No plans to resume anticoagulation at this point.    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 Oncology started olaparib-however patient unfortunately has worsened significantly-very encephalopathic and has subsequently been transitioned to full comfort measures.  Transaminitis Thought to be immune mediated due to [facility] Liver enzymes improved with steroids-plan was to taper steroids slowly-however since initiated on comfort measures-no longer on steroids.  No plans to follow LFTs at this point.    Epistaxis S/p bilateral cauterization-with bilateral nasal packings-this was removed on 4/10 Continues to have mild epistaxis almost on a daily basis even without  anticoagulation (Lovenox discontinued 4/10).  Thankfully these episodes are self-limited/resolved with gentle pressure.  SBO Developed vomiting  from 4/9-4/10-CT abdomen showed possible SBO Thankfully resolved with supportive care-did not even require NG tube.    Vomiting Has had mild intermittent vomiting almost on a daily basis for the past several days.  Abdominal exam appears benign.  No evidence of SBO on x-rays. Suspicion that this is related to her malignancy with leptomeningeal spread-and multiple medications including olaparib.   Continue antiemetics.  Hypokalemia Due to intermittent vomiting Repleted.  History of C. difficile colitis Plan was to continue oral vancomycin x 5 days after stopping Augmentin-4/09-however due to vomiting-this has not been discontinued.   No recurrence of diarrhea  Normocytic anemia Due to underlying malignancy S/p 2 units of PRBC on 4/6  Hyponatremia Mild Follow.  GERD Pepcid  Abnormal EBV/CMV serology Evaluated by infectious disease-unclear significance.  Debility/deconditioning Worsening over the past several days Plans were for home health-however  Palliative care DNR in place Significant worsening over the past several days-has now been transitioned to full comfort measures.  Long discussion with spouse at bedside-there is no way they can take her home-expect inpatient that in a matter of a few days.  Have asked nursing staff to be liberal with visitation policies is much as possible.  BMI: Estimated body mass index is 19.17 kg/m as calculated from the following:   Height as of this encounter:  (1.6 m).   Weight as of this encounter: 49.1 kg.   Code status:   Code Status: DNR   DVT Prophylaxis: SCD  Family Communication: Spouse at bedside   Disposition Plan: Status is: Inpatient Remains inpatient appropriate because: Severity of illness   Planned Discharge Destination: Inpatient death   Diet: Diet Order             DIET SOFT Room service appropriate? Yes; Fluid consistency: Thin  Diet effective now                     Antimicrobial  agents: Anti-infectives (From admission, onward)    Start     Dose/Rate Route Frequency Ordered Stop   05/02/22 2200  vancomycin (VANCOCIN) capsule 125 mg  Status:  Discontinued        125 mg Oral 2 times daily 05/02/22 1345 05/06/22 0609   05/01/22 1100  amoxicillin-clavulanate (AUGMENTIN) 875-125 MG per tablet 1 tablet  Status:  Discontinued        1 tablet Oral Every 12 hours 05/01/22 1012 05/05/22 0624   04/29/22 1000  vancomycin (VANCOREADY) IVPB 750 mg/150 mL  Status:  Discontinued        750 mg 150 mL/hr over 60 Minutes Intravenous Every 12 hours 05/16/2022 1858 04/29/22 0954   04/29/22 0715  vancomycin (VANCOCIN) capsule 125 mg  Status:  Discontinued        125 mg Oral 3 times daily before meals & bedtime 04/29/22 0702 05/02/22 1345   04/29/22 0600  vancomycin (VANCOREADY) IVPB 750 mg/150 mL  Status:  Discontinued        750 mg 150 mL/hr over 60 Minutes Intravenous Every 12 hours 05/24/2022 1732 05/11/2022 1858   05/15/2022 1900  vancomycin (VANCOREADY) IVPB 1250 mg/250 mL        1,250 mg 166.7 mL/hr over 90 Minutes Intravenous  Once 05/20/2022 1841 05/20/2022 2300   05/21/2022 1706  ampicillin (OMNIPEN) 2 g in sodium chloride 0.9 % 100 mL IVPB  Status:  Discontinued  2 g 300 mL/hr over 20 Minutes Intravenous Every 4 hours 2022/05/14 1634 04/29/22 0954   May 14, 2022 1645  cefTRIAXone (ROCEPHIN) 2 g in sodium chloride 0.9 % 100 mL IVPB  Status:  Discontinued        2 g 200 mL/hr over 30 Minutes Intravenous Every 12 hours 2022-05-14 1634 04/29/22 0954   May 14, 2022 1645  vancomycin (VANCOREADY) IVPB 1250 mg/250 mL  Status:  Discontinued        1,250 mg 166.7 mL/hr over 90 Minutes Intravenous  Once 05-14-22 1634 2022-05-14 1841        MEDICATIONS: Scheduled Meds:  scopolamine  1 patch Transdermal Q72H   Continuous Infusions:  lactated ringers 25 mL/hr at 05/10/22 0545   PRN Meds:.acetaminophen **OR** acetaminophen, haloperidol **OR** haloperidol **OR** haloperidol lactate, LORazepam,  morphine injection, polyvinyl alcohol, prochlorperazine **OR** prochlorperazine **OR** prochlorperazine   I have personally reviewed following labs and imaging studies  LABORATORY DATA: CBC: Recent Labs  Lab 05/05/22 0324 05/06/22 0226 05/07/22 0421 05/08/22 0405 05/09/22 0523  WBC 5.0 5.2 3.4* 3.5* 4.4  NEUTROABS 2.5 3.6 1.6* 1.0* 1.4*  HGB 11.2* 10.7* 11.2* 10.4* 10.2*  HCT 33.4* 31.2* 34.0* 30.1* 30.2*  MCV 89.3 89.4 90.4 89.1 89.9  PLT 108* 115* 118* 124* 144*     Basic Metabolic Panel: Recent Labs  Lab 05/05/22 0324 05/06/22 0226 05/07/22 0421 05/08/22 0405 05/09/22 0523  NA 133* 133* 132* 133* 135  K 3.9 4.1 3.1* 3.3* 3.8  CL 110 107 107 106 107  CO2 19* 19* 19* 23 22  GLUCOSE 115* 120* 95 111* 112*  BUN 10 8 7 6 11   CREATININE 0.37* 0.36* 0.40* 0.50 0.45  CALCIUM 7.0* 7.4* 7.3* 8.3* 8.0*  MG  --   --   --  1.9  --      GFR: Estimated Creatinine Clearance: 76.8 mL/min (by C-G formula based on SCr of 0.45 mg/dL).  Liver Function Tests: Recent Labs  Lab 05/05/22 0324 05/06/22 0226 05/07/22 0421 05/08/22 0405 05/09/22 0523  AST 84* 78* 71* 71* 58*  ALT 222* 211* 193* 178* 163*  ALKPHOS 804* 756* 687* 680* 569*  BILITOT 1.6* 1.6* 1.9* 1.5* 1.3*  PROT 5.4* 5.9* 5.6* 5.3* 5.3*  ALBUMIN 2.7* 2.8* 2.7* 2.6* 2.5*    No results for input(s): "LIPASE", "AMYLASE" in the last 168 hours. No results for input(s): "AMMONIA" in the last 168 hours.  Coagulation Profile: Recent Labs  Lab 05/05/22 0324 05/06/22 0226 05/07/22 0421 05/08/22 0405 05/09/22 0523  INR 1.3* 1.3* 1.4* 1.4* 1.2     Cardiac Enzymes: No results for input(s): "CKTOTAL", "CKMB", "CKMBINDEX", "TROPONINI" in the last 168 hours.  BNP (last 3 results) No results for input(s): "PROBNP" in the last 8760 hours.  Lipid Profile: No results for input(s): "CHOL", "HDL", "LDLCALC", "TRIG", "CHOLHDL", "LDLDIRECT" in the last 72 hours.  Thyroid Function Tests: No results for input(s):  "TSH", "T4TOTAL", "FREET4", "T3FREE", "THYROIDAB" in the last 72 hours.  Anemia Panel: No results for input(s): "VITAMINB12", "FOLATE", "FERRITIN", "TIBC", "IRON", "RETICCTPCT" in the last 72 hours.  Urine analysis:    Component Value Date/Time   COLORURINE STRAW (A) 03/07/2022 1630   APPEARANCEUR CLEAR 03/07/2022 1630   LABSPEC 1.031 (H) 03/07/2022 1630   PHURINE 7.0 03/07/2022 1630   GLUCOSEU NEGATIVE 03/07/2022 1630   GLUCOSEU NEGATIVE 08/24/2017 1211   HGBUR NEGATIVE 03/07/2022 1630   BILIRUBINUR NEGATIVE 03/07/2022 1630   BILIRUBINUR N 08/18/2015 1554   KETONESUR NEGATIVE 03/07/2022 1630   PROTEINUR NEGATIVE  03/07/2022 1630   UROBILINOGEN 0.2 08/24/2017 1211   NITRITE NEGATIVE 03/07/2022 1630   LEUKOCYTESUR NEGATIVE 03/07/2022 1630    Sepsis Labs: Lactic Acid, Venous    Component Value Date/Time   LATICACIDVEN 1.2 03/07/2022 1630    MICROBIOLOGY: No results found for this or any previous visit (from the past 240 hour(s)).   RADIOLOGY STUDIES/RESULTS: MR BRAIN W WO CONTRAST  Result Date: 05/08/2022 CLINICAL DATA:  Neuro deficit, acute, stroke suspected Brain metastases, assess treatment response EXAM: MRI HEAD WITHOUT AND WITH CONTRAST TECHNIQUE: Multiplanar, multiecho pulse sequences of the brain and surrounding structures were obtained without and with intravenous contrast. CONTRAST:  9mL GADAVIST GADOBUTROL 1 MMOL/ML IV SOLN COMPARISON:  MRI head April 28, 2022. FINDINGS: Brain: No substantial change in multifocal small areas of restricted diffusion involving frontoparietal lobes bilaterally with similar mild enhancement involving the right perirolandic and left medial parietal cortex. No evidence of new/interval enhancement or acute/interval infarct. No midline shift, acute hemorrhage or hydrocephalus. Chronic microhemorrhage in right parietal lobe. Vascular: Major arterial flow voids are maintained at the skull base. Skull and upper cervical spine: Normal marrow signal.  Sinuses/Orbits: Negative. Other: No mastoid effusions. IMPRESSION: 1. No substantial change in multifocal small areas of restricted diffusion involving frontoparietal lobes bilaterally with similar mild enhancement involving the right perirolandic and left medial parietal cortex. As discussed on the recent prior, these most likely represent enhancing acute/subacute infarcts; however, a follow-up MRI with contrast in approximately 6-7 weeks is recommended to ensure expected evolution and exclude leptomeningeal metastatic disease. 2. No evidence of interval/new abnormality. Electronically Signed   By: Feliberto Harts M.D.   On: 05/08/2022 15:24     LOS: 12 days   Jeoffrey Massed, MD  [facility]    To contact the attending provider between 7A-7P or the covering provider during after hours 7P-7A, please log into the web site [email or web address] and access using universal [place] password for that web site. If you do not have the password, please call the hospital operator.  05/10/2022, 8:05 AM

## 2022-05-10 NOTE — Progress Notes (Signed)
This chaplain responded to the family's request for F/U spiritual care in the space of the Pt.'s transition to comfort care.  The chaplain listened reflectively to the family's grief and offered ways family and friends can continue to love the Pt., care for the Pt., and grieve the anticipated loss.  The chaplain provided education on memory making with the children and "trusts" the family in their discernment.  The chaplain stood beside [place] from the community in the time of sharing scripture, stories, and prayer at the Pt. bedside.    The chaplain offered F/U spiritual care as needed.  Chaplain Stephanie Acre [phone]

## 2022-05-11 DIAGNOSIS — K716 Toxic liver disease with hepatitis, not elsewhere classified: Secondary | ICD-10-CM | POA: Diagnosis not present

## 2022-05-11 DIAGNOSIS — G96198 Other disorders of meninges, not elsewhere classified: Secondary | ICD-10-CM | POA: Diagnosis not present

## 2022-05-11 DIAGNOSIS — C50912 Malignant neoplasm of unspecified site of left female breast: Secondary | ICD-10-CM | POA: Diagnosis not present

## 2022-05-11 DIAGNOSIS — K920 Hematemesis: Secondary | ICD-10-CM | POA: Diagnosis not present

## 2022-05-11 MED ORDER — ARTIFICIAL TEARS OPHTHALMIC OINT: TOPICAL_OINTMENT | OPHTHALMIC | Status: DC | PRN

## 2022-05-11 MED ORDER — MORPHINE 100MG IN NS 100ML (1MG/ML) PREMIX INFUSION
2.0000 mg/h | INTRAVENOUS | Status: DC
  Administered 2022-05-11: 2 mg/h via INTRAVENOUS
  Administered 2022-05-12 – 2022-05-14 (×4): 8 mg/h via INTRAVENOUS
  Administered 2022-05-15: 10 mg/h via INTRAVENOUS
  Administered 2022-05-15: 9 mg/h via INTRAVENOUS
  Administered 2022-05-15: 8 mg/h via INTRAVENOUS
  Administered 2022-05-16 – 2022-05-17 (×3): 10 mg/h via INTRAVENOUS
  Filled 2022-05-11 (×13): qty 100

## 2022-05-11 MED ORDER — MORPHINE BOLUS VIA INFUSION
3.0000 mg | INTRAVENOUS | Status: DC | PRN
  Administered 2022-05-12 (×4): 3 mg via INTRAVENOUS

## 2022-05-11 NOTE — Progress Notes (Addendum)
PROGRESS NOTE        PATIENT DETAILS Name: Carla Clark Age: 37 y.o. Sex: female Date of Birth: 1985/02/04 Admit Date: 05/12/2022 Admitting Physician Clydie Braun, MD [id number], Bryon Lions, MD  Brief Summary: Patient is a 37 y.o.  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  Hospital course was complicated by epistaxis requiring ENT evaluation and nasal packing, transient SBO.  See below for further details.  Significant events: 4/4>> admit to [facility] 4/15>> worsening encephalopathy-transition to full comfort measures.  Significant studies: 4/4>> MRI brain: Possible leptomeningeal metastatic disease, multiple embolic infarcts. 4/5>> CT angio head/neck: No significant stenosis 4/5>> echo: EF 65-70% 4/10>> x-ray abdomen: Dilated loops of small bowel in the right hemiabdomen. 4/10>> CT abdomen/pelvis: SBO with transition point in the right lower quadrant-swirling of the mesentery. 4/14>> MRI brain: No substantial change in multifocal small areas of restricted diffusion in frontoparietal lobes.  No evidence of new abnormality.  Significant microbiology data: 4/4>> blood culture: No growth 4/4>> CSF culture: No growth  Procedures: 4/4>> fluoroscopy guided lumbar puncture  Consults: ENT Palliative Hematology/oncology Neurology Infection disease  Subjective: Unresponsive when I walked in-Per spouse-requiring IV morphine every 2 hours.  After discussion with family-okay to start morphine infusion.  Objective: Vitals: Blood pressure (!) 138/91, pulse 96, temperature 98.2 F (36.8 C), resp. rate 16, height 5\' 3"  (1.6 m), weight 49.1 kg, SpO2 94 %, currently breastfeeding.   Exam: Gen Exam: Unresponsive   Pertinent  Labs/Radiology:    Latest Ref Rng & Units 05/09/2022    5:23 AM 05/08/2022    4:05 AM 05/07/2022    4:21 AM  CBC  WBC 4.0 - 10.5 K/uL 4.4  3.5  3.4   Hemoglobin 12.0 - 15.0 g/dL 88.3  37.4  45.1   Hematocrit 36.0 - 46.0 % 30.2  30.1  34.0   Platelets 150 - 400 K/uL 144  124  118     Lab Results  Component Value Date   NA 135 05/09/2022   K 3.8 05/09/2022   CL 107 05/09/2022   CO2 22 05/09/2022      Assessment/Plan: Palliative care DNR in place Significant worsening over the past several days-has now been transitioned to full comfort measures.  Unresponsive this morning-requiring morphine every 2 hours-after discussion with family-has been transitioned to morphine infusion.  Expect inpatient death in a matter of few day  Acute ischemic stroke Embolic in the setting of hypercoagulable state due to breast cancer Workup as above Briefly on anticoagulation-complicated by bowel obstruction requiring NG tube placement (although NG tube was never placed) intermittent epistaxis.  Eliquis resumed on 4/14-but patient worsened and developed encephalopathy-and subsequently transition to full comfort measures.  No plans to resume anticoagulation at this point.    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 Oncology started olaparib-however patient unfortunately has worsened significantly-very encephalopathic and has subsequently been transitioned to full comfort measures.  Transaminitis Thought to be immune mediated due to [facility] Liver enzymes improved with steroids-plan was to taper steroids slowly-however since initiated on comfort measures-no longer on steroids.  No plans to follow LFTs at this point.    Epistaxis S/p bilateral cauterization-with bilateral nasal packings-this was removed on 4/10 Continues to have mild epistaxis almost on a  daily basis even without anticoagulation (Lovenox discontinued 4/10).  Thankfully these episodes are self-limited/resolved with gentle pressure.  SBO Developed vomiting from 4/9-4/10-CT abdomen showed possible SBO Thankfully resolved with supportive care-did not even require NG tube.    Vomiting Has had mild intermittent vomiting almost on a daily basis for the past several days.  Abdominal exam appears benign.  No evidence of SBO on x-rays. Suspicion that this is related to her malignancy with leptomeningeal spread-and multiple medications including olaparib.   Continue antiemetics.  Hypokalemia Due to intermittent vomiting Repleted.  History of C. difficile colitis Plan was to continue oral vancomycin x 5 days after stopping Augmentin-4/09-however due to vomiting-this has not been discontinued.   No recurrence of diarrhea  Normocytic anemia Due to underlying malignancy S/p 2 units of PRBC on 4/6  Hyponatremia Mild Follow.  GERD Pepcid  Abnormal EBV/CMV serology Evaluated by infectious disease-unclear significance.  Debility/deconditioning Worsening over the past several days Plans were for home health-however  BMI: Estimated body mass index is 19.17 kg/m as calculated from the following:   Height as of this encounter: 5\' 3"  (1.6 m).   Weight as of this encounter: 49.1 kg.   Code status:   Code Status: DNR   DVT Prophylaxis: SCD  Family Communication: Spouse at bedside   Disposition Plan: Status is: Inpatient Remains inpatient appropriate because: Severity of illness   Planned Discharge Destination: Inpatient death   Diet: Diet Order             DIET SOFT Room service appropriate? Yes; Fluid consistency: Thin  Diet effective now                     Antimicrobial agents: Anti-infectives (From admission, onward)    Start     Dose/Rate Route Frequency Ordered Stop   05/02/22 2200  vancomycin (VANCOCIN) capsule 125 mg  Status:  Discontinued         125 mg Oral 2 times daily 05/02/22 1345 05/06/22 0609   05/01/22 1100  amoxicillin-clavulanate (AUGMENTIN) 875-125 MG per tablet 1 tablet  Status:  Discontinued        1 tablet Oral Every 12 hours 05/01/22 1012 05/05/22 0624   04/29/22 1000  vancomycin (VANCOREADY) IVPB 750 mg/150 mL  Status:  Discontinued        750 mg 150 mL/hr over 60 Minutes Intravenous Every 12 hours 05/03/2022 1858 04/29/22 0954   04/29/22 0715  vancomycin (VANCOCIN) capsule 125 mg  Status:  Discontinued        125 mg Oral 3 times daily before meals & bedtime 04/29/22 0702 05/02/22 1345   04/29/22 0600  vancomycin (VANCOREADY) IVPB 750 mg/150 mL  Status:  Discontinued        750 mg 150 mL/hr over 60 Minutes Intravenous Every 12 hours 2022-05-03 1732 May 03, 2022 1858   03-May-2022 1900  vancomycin (VANCOREADY) IVPB 1250 mg/250 mL        1,250 mg 166.7 mL/hr over 90 Minutes Intravenous  Once May 03, 2022 1841 2022/05/03 2300   May 03, 2022 1706  ampicillin (OMNIPEN) 2 g in sodium chloride 0.9 % 100 mL IVPB  Status:  Discontinued        2 g 300 mL/hr over 20 Minutes Intravenous Every 4 hours 05/03/2022 1634 04/29/22 0954   2022/05/03 1645  cefTRIAXone (ROCEPHIN)  2 g in sodium chloride 0.9 % 100 mL IVPB  Status:  Discontinued        2 g 200 mL/hr over 30 Minutes Intravenous Every 12 hours 04/27/2022 1634 04/29/22 0954   05/24/2022 1645  vancomycin (VANCOREADY) IVPB 1250 mg/250 mL  Status:  Discontinued        1,250 mg 166.7 mL/hr over 90 Minutes Intravenous  Once 05/20/2022 1634 05/21/2022 1841        MEDICATIONS: Scheduled Meds:  scopolamine  1 patch Transdermal Q72H   Continuous Infusions:  lactated ringers 25 mL/hr at 05/10/22 0545   morphine 2 mg/hr (05/11/22 1000)   PRN Meds:.acetaminophen **OR** acetaminophen, haloperidol **OR** haloperidol **OR** haloperidol lactate, LORazepam, morphine, polyvinyl alcohol, prochlorperazine **OR** prochlorperazine **OR** prochlorperazine   I have personally reviewed following labs and imaging  studies  LABORATORY DATA: CBC: Recent Labs  Lab 05/05/22 0324 05/06/22 0226 05/07/22 0421 05/08/22 0405 05/09/22 0523  WBC 5.0 5.2 3.4* 3.5* 4.4  NEUTROABS 2.5 3.6 1.6* 1.0* 1.4*  HGB 11.2* 10.7* 11.2* 10.4* 10.2*  HCT 33.4* 31.2* 34.0* 30.1* 30.2*  MCV 89.3 89.4 90.4 89.1 89.9  PLT 108* 115* 118* 124* 144*     Basic Metabolic Panel: Recent Labs  Lab 05/05/22 0324 05/06/22 0226 05/07/22 0421 05/08/22 0405 05/09/22 0523  NA 133* 133* 132* 133* 135  K 3.9 4.1 3.1* 3.3* 3.8  CL 110 107 107 106 107  CO2 19* 19* 19* 23 22  GLUCOSE 115* 120* 95 111* 112*  BUN CREATININE 0.37* 0.36* 0.40* 0.50 0.45  CALCIUM 7.0* 7.4* 7.3* 8.3* 8.0*  MG  --   --   --  1.9  --      GFR: Estimated Creatinine Clearance: 76.8 mL/min (by C-G formula based on SCr of 0.45 mg/dL).  Liver Function Tests: Recent Labs  Lab 05/05/22 0324 05/06/22 0226 05/07/22 0421 05/08/22 0405 05/09/22 0523  AST 84* 78* 71* 71* 58*  ALT 222* 211* 193* 178* 163*  ALKPHOS 804* 756* 687* 680* 569*  BILITOT 1.6* 1.6* 1.9* 1.5* 1.3*  PROT 5.4* 5.9* 5.6* 5.3* 5.3*  ALBUMIN 2.7* 2.8* 2.7* 2.6* 2.5*    No results for input(s): "LIPASE", "AMYLASE" in the last 168 hours. No results for input(s): "AMMONIA" in the last 168 hours.  Coagulation Profile: Recent Labs  Lab 05/05/22 0324 05/06/22 0226 05/07/22 0421 05/08/22 0405 05/09/22 0523  INR 1.3* 1.3* 1.4* 1.4* 1.2     Cardiac Enzymes: No results for input(s): "CKTOTAL", "CKMB", "CKMBINDEX", "TROPONINI" in the last 168 hours.  BNP (last 3 results) No results for input(s): "PROBNP" in the last 8760 hours.  Lipid Profile: No results for input(s): "CHOL", "HDL", "LDLCALC", "TRIG", "CHOLHDL", "LDLDIRECT" in the last 72 hours.  Thyroid Function Tests: No results for input(s): "TSH", "T4TOTAL", "FREET4", "T3FREE", "THYROIDAB" in the last 72 hours.  Anemia Panel: No results for input(s): "VITAMINB12", "FOLATE", "FERRITIN", "TIBC",  "IRON", "RETICCTPCT" in the last 72 hours.  Urine analysis:    Component Value Date/Time   COLORURINE STRAW (A) 03/07/2022 1630   APPEARANCEUR CLEAR 03/07/2022 1630   LABSPEC 1.031 (H) 03/07/2022 1630   PHURINE 7.0 03/07/2022 1630   GLUCOSEU NEGATIVE 03/07/2022 1630   GLUCOSEU NEGATIVE 08/24/2017 1211   HGBUR NEGATIVE 03/07/2022 1630   BILIRUBINUR NEGATIVE 03/07/2022 1630   BILIRUBINUR N 08/18/2015 1554   KETONESUR NEGATIVE 03/07/2022 1630   PROTEINUR NEGATIVE 03/07/2022 1630   UROBILINOGEN 0.2 08/24/2017 1211   NITRITE NEGATIVE 03/07/2022 1630  LEUKOCYTESUR NEGATIVE 03/07/2022 1630    Sepsis Labs: Lactic Acid, Venous    Component Value Date/Time   LATICACIDVEN 1.2 03/07/2022 1630    MICROBIOLOGY: No results found for this or any previous visit (from the past 240 hour(s)).   RADIOLOGY STUDIES/RESULTS: No results found.   LOS: 13 days   Jeoffrey Massed, MD  [facility]    To contact the attending provider between 7A-7P or the covering provider during after hours 7P-7A, please log into the web site [email or web address] and access using universal [place] password for that web site. If you do not have the password, please call the hospital operator.  05/11/2022, 10:45 AM

## 2022-05-11 NOTE — Progress Notes (Signed)
This chaplain is present for EOL spiritual care with the Pt. and family. The chaplain opened the space for the sharing of the Pt. favorite music as a place of grief care for the family. The Pt. is not responsive and resting comfortably.   The family accepted the chaplain's invitation for prayer and continued care.  Chaplain Stephanie Acre [phone]

## 2022-05-11 NOTE — Plan of Care (Signed)
  Problem: Education: Goal: Knowledge of patient specific risk factors will improve Loraine Leriche N/A or DELETE if not current risk factor) Outcome: Progressing   Problem: Ischemic Stroke/TIA Tissue Perfusion: Goal: Complications of ischemic stroke/TIA will be minimized Outcome: Progressing   Problem: Coping: Goal: Will verbalize positive feelings about self Outcome: Progressing Goal: Will identify appropriate support needs Outcome: Progressing   Problem: Health Behavior/Discharge Planning: Goal: Ability to manage health-related needs will improve Outcome: Progressing Goal: Goals will be collaboratively established with patient/family Outcome: Progressing   Problem: Self-Care: Goal: Ability to participate in self-care as condition permits will improve Outcome: Progressing Goal: Verbalization of feelings and concerns over difficulty with self-care will improve Outcome: Progressing Goal: Ability to communicate needs accurately will improve Outcome: Progressing   Problem: Nutrition: Goal: Risk of aspiration will decrease Outcome: Progressing Goal: Dietary intake will improve Outcome: Progressing   Problem: Education: Goal: Knowledge of General Education information will improve Description: Including pain rating scale, medication(s)/side effects and non-pharmacologic comfort measures Outcome: Progressing   Problem: Health Behavior/Discharge Planning: Goal: Ability to manage health-related needs will improve Outcome: Progressing   Problem: Clinical Measurements: Goal: Ability to maintain clinical measurements within normal limits will improve Outcome: Progressing Goal: Will remain free from infection Outcome: Progressing Goal: Diagnostic test results will improve Outcome: Progressing Goal: Respiratory complications will improve Outcome: Progressing Goal: Cardiovascular complication will be avoided Outcome: Progressing   Problem: Activity: Goal: Risk for activity  intolerance will decrease Outcome: Progressing   Problem: Nutrition: Goal: Adequate nutrition will be maintained Outcome: Progressing   Problem: Coping: Goal: Level of anxiety will decrease Outcome: Progressing   Problem: Elimination: Goal: Will not experience complications related to bowel motility Outcome: Progressing Goal: Will not experience complications related to urinary retention Outcome: Progressing   Problem: Pain Managment: Goal: General experience of comfort will improve 05/11/2022 2234 by Hortencia Pilar, RN Outcome: Progressing 05/11/2022 2234 by Hortencia Pilar, RN Outcome: Progressing   Problem: Safety: Goal: Ability to remain free from injury will improve 05/11/2022 2234 by Hortencia Pilar, RN Outcome: Progressing 05/11/2022 2234 by Hortencia Pilar, RN Outcome: Progressing   Problem: Skin Integrity: Goal: Risk for impaired skin integrity will decrease Outcome: Progressing   Problem: Education: Goal: Knowledge of the prescribed therapeutic regimen will improve Outcome: Progressing   Problem: Coping: Goal: Ability to identify and develop effective coping behavior will improve Outcome: Progressing   Problem: Clinical Measurements: Goal: Quality of life will improve Outcome: Progressing   Problem: Respiratory: Goal: Verbalizations of increased ease of respirations will increase Outcome: Progressing   Problem: Role Relationship: Goal: Family's ability to cope with current situation will improve Outcome: Progressing Goal: Ability to verbalize concerns, feelings, and thoughts to partner or family member will improve Outcome: Progressing   Problem: Pain Management: Goal: Satisfaction with pain management regimen will improve Outcome: Progressing

## 2022-05-12 DIAGNOSIS — C7932 Secondary malignant neoplasm of cerebral meninges: Secondary | ICD-10-CM

## 2022-05-12 DIAGNOSIS — R519 Headache, unspecified: Secondary | ICD-10-CM | POA: Diagnosis not present

## 2022-05-12 DIAGNOSIS — R1110 Vomiting, unspecified: Secondary | ICD-10-CM | POA: Diagnosis not present

## 2022-05-12 DIAGNOSIS — I639 Cerebral infarction, unspecified: Secondary | ICD-10-CM | POA: Diagnosis not present

## 2022-05-12 DIAGNOSIS — R42 Dizziness and giddiness: Secondary | ICD-10-CM | POA: Diagnosis not present

## 2022-05-12 MED ORDER — TOBRAMYCIN 0.3 % OP SOLN
1.0000 [drp] | Freq: Four times a day (QID) | OPHTHALMIC | Status: DC
  Administered 2022-05-14 – 2022-05-16 (×10): 1 [drp] via OPHTHALMIC
  Filled 2022-05-12 (×2): qty 5

## 2022-05-12 NOTE — Progress Notes (Signed)
PROGRESS NOTE        PATIENT DETAILS Name: Carla Clark Age: 37 y.o. Sex: female Date of Birth: Mar 25, 1985 Admit Date: 04/25/2022 Admitting Physician Clydie Braun, MD SKA:[id number], Bryon Lions, MD  Brief Summary: Patient is a 37 y.o.  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  Hospital course was complicated by epistaxis requiring ENT evaluation and nasal packing, transient SBO.  See below for further details.  Significant events: 4/4>> admit to [facility] 4/15>> worsening encephalopathy-transition to full comfort measures.  Significant studies: 4/4>> MRI brain: Possible leptomeningeal metastatic disease, multiple embolic infarcts. 4/5>> CT angio head/neck: No significant stenosis 4/5>> echo: EF 65-70% 4/10>> x-ray abdomen: Dilated loops of small bowel in the right hemiabdomen. 4/10>> CT abdomen/pelvis: SBO with transition point in the right lower quadrant-swirling of the mesentery. 4/14>> MRI brain: No substantial change in multifocal small areas of restricted diffusion in frontoparietal lobes.  No evidence of new abnormality.  Significant microbiology data: 4/4>> blood culture: No growth 4/4>> CSF culture: No growth  Procedures: 4/4>> fluoroscopy guided lumbar puncture  Consults: ENT Palliative Hematology/oncology Neurology Infection disease  Subjective:  unresponsive-spouse/family indicates that she is having frequent spells where she seems to be in distress and moans/groans.  Objective: Vitals: Blood pressure 125/79, pulse 96, temperature 98.4 F (36.9 C), temperature source Axillary, resp. rate 16, height 5\' 3"  (1.6 m), weight 49.1 kg, SpO2 94 %, currently breastfeeding.   Exam: Gen Exam: Unresponsive   Pertinent  Labs/Radiology:    Latest Ref Rng & Units 05/09/2022    5:23 AM 05/08/2022    4:05 AM 05/07/2022    4:21 AM  CBC  WBC 4.0 - 10.5 K/uL 4.4  3.5  3.4   Hemoglobin 12.0 - 15.0 g/dL 57.2  62.0  35.5   Hematocrit 36.0 - 46.0 % 30.2  30.1  34.0   Platelets 150 - 400 K/uL 144  124  118     Lab Results  Component Value Date   NA 135 05/09/2022   K 3.8 05/09/2022   CL 107 05/09/2022   CO2 22 05/09/2022      Assessment/Plan: Palliative care DNR in place Significant worsening over the past several days-has now been transitioned to full comfort measures.  Unresponsive-but does moan/groan-discussed with nursing staff-morphine regimen will be adjusted.  Continue morphine infusion-seems to be having apneic spells-likely will pass in a few days.    Acute ischemic stroke Embolic in the setting of hypercoagulable state due to breast cancer Workup as above Briefly on anticoagulation-complicated by bowel obstruction requiring NG tube placement (although NG tube was never placed) intermittent epistaxis.  Eliquis resumed on 4/14-but patient worsened and developed encephalopathy-and subsequently transition to full comfort measures.  No plans to resume anticoagulation at this point.    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 Oncology started olaparib-however patient unfortunately has worsened significantly-very encephalopathic and has subsequently been transitioned to full comfort measures.  Transaminitis Thought to be immune mediated due to [facility] Liver enzymes improved with steroids-plan was to taper steroids slowly-however since initiated on comfort measures-no longer  on steroids.  No plans to follow LFTs at this point.    Epistaxis S/p bilateral cauterization-with bilateral nasal packings-this was removed on 4/10 Continues to have mild  epistaxis almost on a daily basis even without anticoagulation (Lovenox discontinued 4/10).  Thankfully these episodes are self-limited/resolved with gentle pressure.  SBO Developed vomiting from 4/9-4/10-CT abdomen showed possible SBO Thankfully resolved with supportive care-did not even require NG tube.    Vomiting Has had mild intermittent vomiting almost on a daily basis for the past several days.  Abdominal exam appears benign.  No evidence of SBO on x-rays. Suspicion that this is related to her malignancy with leptomeningeal spread-and multiple medications including olaparib.   Continue antiemetics.  Hypokalemia Due to intermittent vomiting Repleted.  History of C. difficile colitis Plan was to continue oral vancomycin x 5 days after stopping Augmentin-4/09-however due to vomiting-this has not been discontinued.   No recurrence of diarrhea  Normocytic anemia Due to underlying malignancy S/p 2 units of PRBC on 4/6  Hyponatremia Mild Follow.  GERD Pepcid  Abnormal EBV/CMV serology Evaluated by infectious disease-unclear significance.  Debility/deconditioning Worsening over the past several days Plans were for home health-however  BMI: Estimated body mass index is 19.17 kg/m as calculated from the following:   Height as of this encounter:  (1.6 m).   Weight as of this encounter: 49.1 kg.   Code status:   Code Status: DNR   DVT Prophylaxis: SCD  Family Communication: Spouse at bedside   Disposition Plan: Status is: Inpatient Remains inpatient appropriate because: Severity of illness   Planned Discharge Destination: Inpatient death   Diet: Diet Order             DIET SOFT Room service appropriate? Yes; Fluid consistency: Thin  Diet effective now                     Antimicrobial agents: Anti-infectives (From admission, onward)    Start     Dose/Rate Route Frequency Ordered Stop   05/02/22 2200  vancomycin (VANCOCIN) capsule 125 mg   Status:  Discontinued        125 mg Oral 2 times daily 05/02/22 1345 05/06/22 0609   05/01/22 1100  amoxicillin-clavulanate (AUGMENTIN) 875-125 MG per tablet 1 tablet  Status:  Discontinued        1 tablet Oral Every 12 hours 05/01/22 1012 05/05/22 0624   04/29/22 1000  vancomycin (VANCOREADY) IVPB 750 mg/150 mL  Status:  Discontinued        750 mg 150 mL/hr over 60 Minutes Intravenous Every 12 hours 05/16/2022 1858 04/29/22 0954   04/29/22 0715  vancomycin (VANCOCIN) capsule 125 mg  Status:  Discontinued        125 mg Oral 3 times daily before meals & bedtime 04/29/22 0702 05/02/22 1345   04/29/22 0600  vancomycin (VANCOREADY) IVPB 750 mg/150 mL  Status:  Discontinued        750 mg 150 mL/hr over 60 Minutes Intravenous Every 12 hours 05/12/2022 1732 05/18/2022 1858   05/05/2022 1900  vancomycin (VANCOREADY) IVPB 1250 mg/250 mL        1,250 mg 166.7 mL/hr over 90 Minutes Intravenous  Once 05/19/2022 1841 05/10/2022 2300   05/23/2022 1706  ampicillin (OMNIPEN) 2 g in sodium chloride 0.9 % 100 mL IVPB  Status:  Discontinued        2 g 300 mL/hr over 20 Minutes Intravenous Every 4 hours 05/07/2022 1634 04/29/22 0954   05/20/2022 1645  cefTRIAXone (ROCEPHIN) 2 g in sodium chloride 0.9 % 100 mL IVPB  Status:  Discontinued        2 g 200 mL/hr over 30 Minutes Intravenous Every 12 hours 05/08/2022 1634 04/29/22 0954   04/25/2022 1645  vancomycin (VANCOREADY) IVPB 1250 mg/250 mL  Status:  Discontinued        1,250 mg 166.7 mL/hr over 90 Minutes Intravenous  Once 05/08/2022 1634 05/08/2022 1841        MEDICATIONS: Scheduled Meds:  scopolamine  1 patch Transdermal Q72H   tobramycin  1 drop Right Eye Q6H   Continuous Infusions:  lactated ringers 10 mL/hr at 05/12/22 0729   morphine 6 mg/hr (05/12/22 0919)   PRN Meds:.acetaminophen **OR** acetaminophen, artificial tears, [DISCONTINUED] haloperidol **OR** haloperidol **OR** haloperidol lactate, LORazepam, morphine, polyvinyl alcohol, [DISCONTINUED] prochlorperazine  **OR** [DISCONTINUED] prochlorperazine **OR** prochlorperazine   I have personally reviewed following labs and imaging studies  LABORATORY DATA: CBC: Recent Labs  Lab 05/06/22 0226 05/07/22 0421 05/08/22 0405 05/09/22 0523  WBC 5.2 3.4* 3.5* 4.4  NEUTROABS 3.6 1.6* 1.0* 1.4*  HGB 10.7* 11.2* 10.4* 10.2*  HCT 31.2* 34.0* 30.1* 30.2*  MCV 89.4 90.4 89.1 89.9  PLT 115* 118* 124* 144*     Basic Metabolic Panel: Recent Labs  Lab 05/06/22 0226 05/07/22 0421 05/08/22 0405 05/09/22 0523  NA 133* 132* 133* 135  K 4.1 3.1* 3.3* 3.8  CL 107 107 106 107  CO2 19* 19* 23 22  GLUCOSE 120* 95 111* 112*  BUN CREATININE 0.36* 0.40* 0.50 0.45  CALCIUM 7.4* 7.3* 8.3* 8.0*  MG  --   --  1.9  --      GFR: Estimated Creatinine Clearance: 76.8 mL/min (by C-G formula based on SCr of 0.45 mg/dL).  Liver Function Tests: Recent Labs  Lab 05/06/22 0226 05/07/22 0421 05/08/22 0405 05/09/22 0523  AST 78* 71* 71* 58*  ALT 211* 193* 178* 163*  ALKPHOS 756* 687* 680* 569*  BILITOT 1.6* 1.9* 1.5* 1.3*  PROT 5.9* 5.6* 5.3* 5.3*  ALBUMIN 2.8* 2.7* 2.6* 2.5*    No results for input(s): "LIPASE", "AMYLASE" in the last 168 hours. No results for input(s): "AMMONIA" in the last 168 hours.  Coagulation Profile: Recent Labs  Lab 05/06/22 0226 05/07/22 0421 05/08/22 0405 05/09/22 0523  INR 1.3* 1.4* 1.4* 1.2     Cardiac Enzymes: No results for input(s): "CKTOTAL", "CKMB", "CKMBINDEX", "TROPONINI" in the last 168 hours.  BNP (last 3 results) No results for input(s): "PROBNP" in the last 8760 hours.  Lipid Profile: No results for input(s): "CHOL", "HDL", "LDLCALC", "TRIG", "CHOLHDL", "LDLDIRECT" in the last 72 hours.  Thyroid Function Tests: No results for input(s): "TSH", "T4TOTAL", "FREET4", "T3FREE", "THYROIDAB" in the last 72 hours.  Anemia Panel: No results for input(s): "VITAMINB12", "FOLATE", "FERRITIN", "TIBC", "IRON", "RETICCTPCT" in the last 72  hours.  Urine analysis:    Component Value Date/Time   COLORURINE STRAW (A) 03/07/2022 1630   APPEARANCEUR CLEAR 03/07/2022 1630   LABSPEC 1.031 (H) 03/07/2022 1630   PHURINE 7.0 03/07/2022 1630   GLUCOSEU NEGATIVE 03/07/2022 1630   GLUCOSEU NEGATIVE 08/24/2017 1211   HGBUR NEGATIVE 03/07/2022 1630   BILIRUBINUR NEGATIVE 03/07/2022 1630   BILIRUBINUR N 08/18/2015 1554   KETONESUR NEGATIVE 03/07/2022 1630   PROTEINUR NEGATIVE 03/07/2022 1630   UROBILINOGEN 0.2 08/24/2017 1211   NITRITE NEGATIVE 03/07/2022 1630   LEUKOCYTESUR NEGATIVE 03/07/2022 1630    Sepsis Labs: Lactic Acid, Venous    Component  Value Date/Time   LATICACIDVEN 1.2 03/07/2022 1630    MICROBIOLOGY: No results found for this or any previous visit (from the past 240 hour(s)).   RADIOLOGY STUDIES/RESULTS: No results found.   LOS: 14 days   Jeoffrey Massed, MD  [facility]    To contact the attending provider between 7A-7P or the covering provider during after hours 7P-7A, please log into the web site [email or web address] and access using universal [place] password for that web site. If you do not have the password, please call the hospital operator.  05/12/2022, 9:30 AM

## 2022-05-12 NOTE — Progress Notes (Signed)
Physical Therapy Discharge Patient Details Name: Carla Clark MRN: [id number] DOB: [birth year] Today's Date: 05/12/2022 Time:  -     Patient discharged from PT services secondary to medical decline - will need to re-order PT to resume therapy services and is now comfort care. .  Please see latest therapy progress note for current level of functioning and progress toward goals.    Progress and discharge plan discussed with patient and/or caregiver: Patient unable to participate in discharge planning and no caregivers available  GP     Bevelyn Buckles 05/12/2022, 8:10 AM  [name],PT Acute Rehab Services [phone]

## 2022-05-12 NOTE — Plan of Care (Signed)
  Problem: Pain Managment: Goal: General experience of comfort will improve Outcome: Progressing   

## 2022-05-12 NOTE — Progress Notes (Signed)
This chaplain is present for F/U EOL spiritual care with the Pt. and family. The chaplain observed the ritual of spending time together among the family.  This sacred space is the setting for laughter and tears as they grieve.  This chaplain is available for F/U spiriutal care as needed.  Chaplain Stephanie Acre [phone]

## 2022-05-12 NOTE — Progress Notes (Signed)
Ms. Haner is still with [place].  She is on morphine infusion now.  This is still on a relatively low-dose 3 mg an hour.  She open is a left eye a little bit.  Apparently there is some possible infection in the right eye.  I still think we need to treat that.  I will try her on some tobramycin eyedrops.  She is comfortable.  Her family says that there are some positive with her breathing which does not surprise me.  She does seem to be quite relaxed.  I do not see any type of dyspnea.  There is no grimacing.  Sometimes it is hard to put a time frame on something like this.  I know her family is doing a good job being with her.  I know this is mentally exhausting for them.  There is such a good family.  I am not surprised by their involvement.  I do appreciate the great care that the staff is giving her up on [address]  We just have to wait and see when the good Shaune Pollack will take her home.   Christin Bach, MD    2 [place] 4:16-18

## 2022-05-13 DIAGNOSIS — R519 Headache, unspecified: Secondary | ICD-10-CM | POA: Diagnosis not present

## 2022-05-13 DIAGNOSIS — R1110 Vomiting, unspecified: Secondary | ICD-10-CM | POA: Diagnosis not present

## 2022-05-13 DIAGNOSIS — I639 Cerebral infarction, unspecified: Secondary | ICD-10-CM | POA: Diagnosis not present

## 2022-05-13 DIAGNOSIS — R42 Dizziness and giddiness: Secondary | ICD-10-CM | POA: Diagnosis not present

## 2022-05-13 NOTE — Progress Notes (Signed)
Carla Clark is still with this.  She is comfortable.  She now on morphine infusion at 8 mg an hour.  She really is not responding.  She has been fairly peaceful for the past 12 hours or so.  Her family is with her.  They are  such a good family.  There is no seizure activity.  There is no obvious agitation.  Again, it is hard to say how long this will go on.  She really has not had much of a change in her pattern of breathing.  This uses a good sign that wants the pattern of breathing changes, that the patient will pass within 24 hours.  I am glad that she is comfortable.  I am glad that she is not in any pain.  She does not have any dyspnea.  I know that the staff on [address] are doing a great job with her.  Christin Bach, MD  [facility] 23:43

## 2022-05-13 NOTE — Progress Notes (Signed)
PROGRESS NOTE        PATIENT DETAILS Name: Carla Clark Age: 37 y.o. Sex: female Date of Birth: 04/07/1985 Admit Date: 05/21/2022 Admitting Physician Clydie Braun, MD ZOX:[id number], Bryon Lions, MD  Brief Summary: Patient is a 37 y.o.  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  Hospital course was complicated by epistaxis requiring ENT evaluation and nasal packing, transient SBO.  See below for further details.  Significant events: 4/4>> admit to [facility] 4/15>> worsening encephalopathy-transition to full comfort measures.  Significant studies: 4/4>> MRI brain: Possible leptomeningeal metastatic disease, multiple embolic infarcts. 4/5>> CT angio head/neck: No significant stenosis 4/5>> echo: EF 65-70% 4/10>> x-ray abdomen: Dilated loops of small bowel in the right hemiabdomen. 4/10>> CT abdomen/pelvis: SBO with transition point in the right lower quadrant-swirling of the mesentery. 4/14>> MRI brain: No substantial change in multifocal small areas of restricted diffusion in frontoparietal lobes.  No evidence of new abnormality.  Significant microbiology data: 4/4>> blood culture: No growth 4/4>> CSF culture: No growth  Procedures: 4/4>> fluoroscopy guided lumbar puncture  Consults: ENT Palliative Hematology/oncology Neurology Infection disease  Subjective: Unresponsive-having apneic spells this morning.  Objective: Vitals: Blood pressure 125/79, pulse 96, temperature 98.2 F (36.8 C), temperature source Axillary, resp. rate 16, height  (1.6 m), weight 49.1 kg, SpO2 94 %, currently breastfeeding.   Exam: Unresponsive-with several seconds of apneic spells now.  Pertinent Labs/Radiology:    Latest Ref Rng & Units  05/09/2022    5:23 AM 05/08/2022    4:05 AM 05/07/2022    4:21 AM  CBC  WBC 4.0 - 10.5 K/uL 4.4  3.5  3.4   Hemoglobin 12.0 - 15.0 g/dL 54.0  98.1  19.1   Hematocrit 36.0 - 46.0 % 30.2  30.1  34.0   Platelets 150 - 400 K/uL 144  124  118     Lab Results  Component Value Date   NA 135 05/09/2022   K 3.8 05/09/2022   CL 107 05/09/2022   CO2 22 05/09/2022      Assessment/Plan: Palliative care DNR in place Significant worsening over the past several days-has now been transitioned to full comfort measures.  Appears comfortable overnight-morphine infusion dosage has been adjusted by nursing staff to ensure no further breakthrough pain.  She is now having apneic spells-suspect inpatient that in a matter of hours/few days.  Do not think she is stable to be moved to residential hospice at this point.  Acute ischemic stroke Embolic in the setting of hypercoagulable state due to breast cancer Workup as above Briefly on anticoagulation-complicated by bowel obstruction requiring NG tube placement (although NG tube was never placed) intermittent epistaxis.  Eliquis resumed on 4/14-but patient worsened and developed encephalopathy-and subsequently transition to full comfort measures.  No plans to resume anticoagulation at this point.    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 Oncology started olaparib-however patient unfortunately has worsened significantly-very encephalopathic and has subsequently been transitioned to full comfort measures.  Transaminitis Thought to be immune mediated due to [facility] Liver enzymes improved with steroids-plan  was to taper steroids slowly-however since initiated on comfort measures-no longer on steroids.  No plans to follow LFTs at this point.    Epistaxis S/p bilateral cauterization-with bilateral nasal  packings-this was removed on 4/10 Continues to have mild epistaxis almost on a daily basis even without anticoagulation (Lovenox discontinued 4/10).  Thankfully these episodes are self-limited/resolved with gentle pressure.  SBO Developed vomiting from 4/9-4/10-CT abdomen showed possible SBO Thankfully resolved with supportive care-did not even require NG tube.    Vomiting Has had mild intermittent vomiting almost on a daily basis for the past several days.  Abdominal exam appears benign.  No evidence of SBO on x-rays. Suspicion that this is related to her malignancy with leptomeningeal spread-and multiple medications including olaparib.   Continue antiemetics.  Hypokalemia Due to intermittent vomiting Repleted.  History of C. difficile colitis Plan was to continue oral vancomycin x 5 days after stopping Augmentin-4/09-however due to vomiting-this has not been discontinued.   No recurrence of diarrhea  Normocytic anemia Due to underlying malignancy S/p 2 units of PRBC on 4/6  Hyponatremia Mild Follow.  GERD Pepcid  Abnormal EBV/CMV serology Evaluated by infectious disease-unclear significance.  Debility/deconditioning Worsening over the past several days Plans were for home health-however  BMI: Estimated body mass index is 19.17 kg/m as calculated from the following:   Height as of this encounter:  (1.6 m).   Weight as of this encounter: 49.1 kg.   Code status:   Code Status: DNR   DVT Prophylaxis: SCD  Family Communication: Spouse at bedside   Disposition Plan: Status is: Inpatient Remains inpatient appropriate because: Severity of illness   Planned Discharge Destination: Inpatient death   Diet: Diet Order             DIET SOFT Room service appropriate? Yes; Fluid consistency: Thin  Diet effective now                     Antimicrobial agents: Anti-infectives (From admission, onward)    Start     Dose/Rate Route Frequency Ordered Stop    05/02/22 2200  vancomycin (VANCOCIN) capsule 125 mg  Status:  Discontinued        125 mg Oral 2 times daily 05/02/22 1345 05/06/22 0609   05/01/22 1100  amoxicillin-clavulanate (AUGMENTIN) 875-125 MG per tablet 1 tablet  Status:  Discontinued        1 tablet Oral Every 12 hours 05/01/22 1012 05/05/22 0624   04/29/22 1000  vancomycin (VANCOREADY) IVPB 750 mg/150 mL  Status:  Discontinued        750 mg 150 mL/hr over 60 Minutes Intravenous Every 12 hours 05/05/2022 1858 04/29/22 0954   04/29/22 0715  vancomycin (VANCOCIN) capsule 125 mg  Status:  Discontinued        125 mg Oral 3 times daily before meals & bedtime 04/29/22 0702 05/02/22 1345   04/29/22 0600  vancomycin (VANCOREADY) IVPB 750 mg/150 mL  Status:  Discontinued        750 mg 150 mL/hr over 60 Minutes Intravenous Every 12 hours 05/10/2022 1732 05/16/2022 1858   05/14/2022 1900  vancomycin (VANCOREADY) IVPB 1250 mg/250 mL        1,250 mg 166.7 mL/hr over 90 Minutes Intravenous  Once 04/27/2022 1841 05/06/2022 2300   05/16/2022 1706  ampicillin (OMNIPEN) 2 g in sodium chloride 0.9 % 100 mL IVPB  Status:  Discontinued        2 g 300 mL/hr over 20 Minutes Intravenous  Every 4 hours 05/10/2022 1634 04/29/22 0954   05/10/2022 1645  cefTRIAXone (ROCEPHIN) 2 g in sodium chloride 0.9 % 100 mL IVPB  Status:  Discontinued        2 g 200 mL/hr over 30 Minutes Intravenous Every 12 hours 2022/05/10 1634 04/29/22 0954   May 10, 2022 1645  vancomycin (VANCOREADY) IVPB 1250 mg/250 mL  Status:  Discontinued        1,250 mg 166.7 mL/hr over 90 Minutes Intravenous  Once 10-May-2022 1634 05-10-22 1841        MEDICATIONS: Scheduled Meds:  scopolamine  1 patch Transdermal Q72H   tobramycin  1 drop Right Eye Q6H   Continuous Infusions:  lactated ringers 10 mL/hr at 05/12/22 0729   morphine 8 mg/hr (05/12/22 1058)   PRN Meds:.acetaminophen **OR** acetaminophen, artificial tears, [DISCONTINUED] haloperidol **OR** haloperidol **OR** haloperidol lactate, LORazepam,  morphine, polyvinyl alcohol, [DISCONTINUED] prochlorperazine **OR** [DISCONTINUED] prochlorperazine **OR** prochlorperazine   I have personally reviewed following labs and imaging studies  LABORATORY DATA: CBC: Recent Labs  Lab 05/07/22 0421 05/08/22 0405 05/09/22 0523  WBC 3.4* 3.5* 4.4  NEUTROABS 1.6* 1.0* 1.4*  HGB 11.2* 10.4* 10.2*  HCT 34.0* 30.1* 30.2*  MCV 90.4 89.1 89.9  PLT 118* 124* 144*     Basic Metabolic Panel: Recent Labs  Lab 05/07/22 0421 05/08/22 0405 05/09/22 0523  NA 132* 133* 135  K 3.1* 3.3* 3.8  CL 107 106 107  CO2 19* 23 22  GLUCOSE 95 111* 112*  BUN 7 6 11   CREATININE 0.40* 0.50 0.45  CALCIUM 7.3* 8.3* 8.0*  MG  --  1.9  --      GFR: Estimated Creatinine Clearance: 76.8 mL/min (by C-G formula based on SCr of 0.45 mg/dL).  Liver Function Tests: Recent Labs  Lab 05/07/22 0421 05/08/22 0405 05/09/22 0523  AST 71* 71* 58*  ALT 193* 178* 163*  ALKPHOS 687* 680* 569*  BILITOT 1.9* 1.5* 1.3*  PROT 5.6* 5.3* 5.3*  ALBUMIN 2.7* 2.6* 2.5*    No results for input(s): "LIPASE", "AMYLASE" in the last 168 hours. No results for input(s): "AMMONIA" in the last 168 hours.  Coagulation Profile: Recent Labs  Lab 05/07/22 0421 05/08/22 0405 05/09/22 0523  INR 1.4* 1.4* 1.2     Cardiac Enzymes: No results for input(s): "CKTOTAL", "CKMB", "CKMBINDEX", "TROPONINI" in the last 168 hours.  BNP (last 3 results) No results for input(s): "PROBNP" in the last 8760 hours.  Lipid Profile: No results for input(s): "CHOL", "HDL", "LDLCALC", "TRIG", "CHOLHDL", "LDLDIRECT" in the last 72 hours.  Thyroid Function Tests: No results for input(s): "TSH", "T4TOTAL", "FREET4", "T3FREE", "THYROIDAB" in the last 72 hours.  Anemia Panel: No results for input(s): "VITAMINB12", "FOLATE", "FERRITIN", "TIBC", "IRON", "RETICCTPCT" in the last 72 hours.  Urine analysis:    Component Value Date/Time   COLORURINE STRAW (A) 03/07/2022 1630   APPEARANCEUR  CLEAR 03/07/2022 1630   LABSPEC 1.031 (H) 03/07/2022 1630   PHURINE 7.0 03/07/2022 1630   GLUCOSEU NEGATIVE 03/07/2022 1630   GLUCOSEU NEGATIVE 08/24/2017 1211   HGBUR NEGATIVE 03/07/2022 1630   BILIRUBINUR NEGATIVE 03/07/2022 1630   BILIRUBINUR N 08/18/2015 1554   KETONESUR NEGATIVE 03/07/2022 1630   PROTEINUR NEGATIVE 03/07/2022 1630   UROBILINOGEN 0.2 08/24/2017 1211   NITRITE NEGATIVE 03/07/2022 1630   LEUKOCYTESUR NEGATIVE 03/07/2022 1630    Sepsis Labs: Lactic Acid, Venous    Component Value Date/Time   LATICACIDVEN 1.2 03/07/2022 1630    MICROBIOLOGY: No results found for this or any previous  visit (from the past 240 hour(s)).   RADIOLOGY STUDIES/RESULTS: No results found.   LOS: 15 days   Jeoffrey Massed, MD  [facility]    To contact the attending provider between 7A-7P or the covering provider during after hours 7P-7A, please log into the web site [email or web address] and access using universal [place] password for that web site. If you do not have the password, please call the hospital operator.  05/13/2022, 8:29 AM

## 2022-05-14 DIAGNOSIS — R42 Dizziness and giddiness: Secondary | ICD-10-CM | POA: Diagnosis not present

## 2022-05-14 DIAGNOSIS — I639 Cerebral infarction, unspecified: Secondary | ICD-10-CM | POA: Diagnosis not present

## 2022-05-14 DIAGNOSIS — R519 Headache, unspecified: Secondary | ICD-10-CM | POA: Diagnosis not present

## 2022-05-14 DIAGNOSIS — R1110 Vomiting, unspecified: Secondary | ICD-10-CM | POA: Diagnosis not present

## 2022-05-14 NOTE — Progress Notes (Signed)
PROGRESS NOTE        PATIENT DETAILS Name: Carla Clark Age: 37 y.o. Sex: female Date of Birth: Aug 14, 1985 Admit Date: 05/07/2022 Admitting Physician Clydie Braun, MD ZOX:[id number], Bryon Lions, MD  Brief Summary: Patient is a 36 y.o.  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  Hospital course was complicated by epistaxis requiring ENT evaluation and nasal packing, transient SBO.  See below for further details.  Significant events: 4/4>> admit to [facility] 4/15>> worsening encephalopathy-transition to full comfort measures.  Significant studies: 4/4>> MRI brain: Possible leptomeningeal metastatic disease, multiple embolic infarcts. 4/5>> CT angio head/neck: No significant stenosis 4/5>> echo: EF 65-70% 4/10>> x-ray abdomen: Dilated loops of small bowel in the right hemiabdomen. 4/10>> CT abdomen/pelvis: SBO with transition point in the right lower quadrant-swirling of the mesentery. 4/14>> MRI brain: No substantial change in multifocal small areas of restricted diffusion in frontoparietal lobes.  No evidence of new abnormality.  Significant microbiology data: 4/4>> blood culture: No growth 4/4>> CSF culture: No growth  Procedures: 4/4>> fluoroscopy guided lumbar puncture  Consults: ENT Palliative Hematology/oncology Neurology Infection disease  Subjective: Unresponsive-having apneic spells this morning.  Objective: Vitals: Blood pressure 107/63, pulse (!) 136, temperature 98.2 F (36.8 C), temperature source Axillary, resp. rate 16, height  (1.6 m), weight 49.1 kg, SpO2 (!) 89 %, currently breastfeeding.   Exam:  Patient in bed unresponsive, on morphine drip, unable to answer questions or follow commands but  appears to be in no distress.  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    Palliative care  DNR in place Significant worsening over the past several days-has now been transitioned to full comfort measures.  Appears comfortable overnight-morphine infusion dosage has been adjusted by nursing staff to ensure no further breakthrough pain.  She is now having apneic spells-suspect inpatient that in a matter of hours/few days.  Do not think she is stable to be moved to residential hospice at this point.  Acute ischemic stroke Embolic in the setting of hypercoagulable state due to breast cancer Workup as above Briefly on anticoagulation-complicated by bowel obstruction requiring NG tube placement (although NG tube was never placed) intermittent epistaxis.  Eliquis resumed on 4/14-but patient worsened and developed encephalopathy-and subsequently transition to full comfort measures.  No plans to resume anticoagulation at this point.    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 Oncology started olaparib-however patient unfortunately has worsened significantly-very encephalopathic and has subsequently been transitioned to full comfort measures.  Transaminitis Thought to be immune mediated due to [facility] Liver enzymes improved with steroids-plan was to taper steroids slowly-however since initiated on comfort measures-no longer on steroids.  No plans to follow LFTs at this point.    Epistaxis S/p bilateral cauterization-with bilateral nasal packings-this was removed on 4/10 Continues to have mild epistaxis almost on a daily basis even  without anticoagulation (Lovenox discontinued 4/10).  Thankfully these episodes are self-limited/resolved with gentle pressure.  SBO Developed vomiting from 4/9-4/10-CT abdomen showed possible SBO Thankfully resolved with supportive care-did not even require NG tube.    Vomiting Has had mild intermittent vomiting almost on a daily basis for the past several days.  Abdominal exam appears benign.  No evidence of SBO on x-rays. Suspicion that this is related to her malignancy with leptomeningeal spread-and multiple medications including olaparib.   Continue antiemetics.  Hypokalemia Due to intermittent vomiting Repleted.  History of C. difficile colitis Plan was to continue oral vancomycin x 5 days after stopping Augmentin-4/09-however due to vomiting-this has not been discontinued.   No recurrence of diarrhea  Normocytic anemia Due to underlying malignancy S/p 2 units of PRBC on 4/6  Hyponatremia Mild Follow.  GERD Pepcid  Abnormal EBV/CMV serology Evaluated by infectious disease-unclear significance.  Debility/deconditioning Worsening over the past several days Plans were for home health-however  BMI: Estimated body mass index is 19.17 kg/m as calculated from the following:   Height as of this encounter:  (1.6 m).   Weight as of this encounter: 49.1 kg.    Code status:   Code Status: DNR   DVT Prophylaxis: SCD  Family Communication: Spouse at bedside   Disposition Plan: Status is: Inpatient Remains inpatient appropriate because: Severity of illness   Planned Discharge Destination: Inpatient death   Diet: Diet Order     None         Antimicrobial agents: Anti-infectives (From admission, onward)    Start     Dose/Rate Route Frequency Ordered Stop   05/02/22 2200  vancomycin (VANCOCIN) capsule 125 mg  Status:  Discontinued        125 mg Oral 2 times daily 05/02/22 1345 05/06/22 0609   05/01/22 1100  amoxicillin-clavulanate (AUGMENTIN) 875-125  MG per tablet 1 tablet  Status:  Discontinued        1 tablet Oral Every 12 hours 05/01/22 1012 05/05/22 0624   04/29/22 1000  vancomycin (VANCOREADY) IVPB 750 mg/150 mL  Status:  Discontinued        750 mg 150 mL/hr over 60 Minutes Intravenous Every 12 hours 05/19/2022 1858 04/29/22 0954   04/29/22 0715  vancomycin (VANCOCIN) capsule 125 mg  Status:  Discontinued        125 mg Oral 3 times daily before meals & bedtime 04/29/22 0702 05/02/22 1345   04/29/22 0600  vancomycin (VANCOREADY) IVPB 750 mg/150 mL  Status:  Discontinued        750 mg 150 mL/hr over 60 Minutes Intravenous Every 12 hours 05/19/2022 1732 05/23/2022 1858   04/27/2022 1900  vancomycin (VANCOREADY) IVPB 1250 mg/250 mL        1,250 mg 166.7 mL/hr over 90 Minutes Intravenous  Once 05/03/2022 1841 05/20/2022 2300   05/06/2022 1706  ampicillin (OMNIPEN) 2 g in sodium chloride 0.9 % 100 mL IVPB  Status:  Discontinued        2 g 300 mL/hr over 20 Minutes Intravenous Every 4 hours 05/18/2022 1634 04/29/22 0954   05/01/2022 1645  cefTRIAXone (ROCEPHIN) 2 g in sodium chloride 0.9 % 100 mL IVPB  Status:  Discontinued        2 g 200 mL/hr over 30 Minutes Intravenous Every 12 hours 05/04/2022 1634 04/29/22 0954   05/16/2022 1645  vancomycin (VANCOREADY) IVPB 1250 mg/250 mL  Status:  Discontinued        1,250 mg 166.7 mL/hr over 90 Minutes Intravenous  Once 05/02/2022 1634 05/01/2022  4098        MEDICATIONS: Scheduled Meds:  scopolamine  1 patch Transdermal Q72H   tobramycin  1 drop Right Eye Q6H   Continuous Infusions:  lactated ringers 10 mL/hr at 05/12/22 0729   morphine 8 mg/hr (05/14/22 0036)   PRN Meds:.acetaminophen **OR** acetaminophen, artificial tears, [DISCONTINUED] haloperidol **OR** haloperidol **OR** haloperidol lactate, LORazepam, morphine, polyvinyl alcohol, [DISCONTINUED] prochlorperazine **OR** [DISCONTINUED] prochlorperazine **OR** prochlorperazine   I have personally reviewed following labs and imaging studies  LABORATORY  DATA: CBC: Recent Labs  Lab 05/08/22 0405 05/09/22 0523  WBC 3.5* 4.4  NEUTROABS 1.0* 1.4*  HGB 10.4* 10.2*  HCT 30.1* 30.2*  MCV 89.1 89.9  PLT 124* 144*    Basic Metabolic Panel: Recent Labs  Lab 05/08/22 0405 05/09/22 0523  NA 133* 135  K 3.3* 3.8  CL 106 107  CO2 23 22  GLUCOSE 111* 112*  BUN 6 11  CREATININE 0.50 0.45  CALCIUM 8.3* 8.0*  MG 1.9  --     GFR: Estimated Creatinine Clearance: 76.8 mL/min (by C-G formula based on SCr of 0.45 mg/dL).  Liver Function Tests: Recent Labs  Lab 05/08/22 0405 05/09/22 0523  AST 71* 58*  ALT 178* 163*  ALKPHOS 680* 569*  BILITOT 1.5* 1.3*  PROT 5.3* 5.3*  ALBUMIN 2.6* 2.5*   No results for input(s): "LIPASE", "AMYLASE" in the last 168 hours. No results for input(s): "AMMONIA" in the last 168 hours.  Coagulation Profile: Recent Labs  Lab 05/08/22 0405 05/09/22 0523  INR 1.4* 1.2    Cardiac Enzymes: No results for input(s): "CKTOTAL", "CKMB", "CKMBINDEX", "TROPONINI" in the last 168 hours.  BNP (last 3 results) No results for input(s): "PROBNP" in the last 8760 hours.  Lipid Profile: No results for input(s): "CHOL", "HDL", "LDLCALC", "TRIG", "CHOLHDL", "LDLDIRECT" in the last 72 hours.  Thyroid Function Tests: No results for input(s): "TSH", "T4TOTAL", "FREET4", "T3FREE", "THYROIDAB" in the last 72 hours.  Anemia Panel: No results for input(s): "VITAMINB12", "FOLATE", "FERRITIN", "TIBC", "IRON", "RETICCTPCT" in the last 72 hours.  Urine analysis:    Component Value Date/Time   COLORURINE STRAW (A) 03/07/2022 1630   APPEARANCEUR CLEAR 03/07/2022 1630   LABSPEC 1.031 (H) 03/07/2022 1630   PHURINE 7.0 03/07/2022 1630   GLUCOSEU NEGATIVE 03/07/2022 1630   GLUCOSEU NEGATIVE 08/24/2017 1211   HGBUR NEGATIVE 03/07/2022 1630   BILIRUBINUR NEGATIVE 03/07/2022 1630   BILIRUBINUR N 08/18/2015 1554   KETONESUR NEGATIVE 03/07/2022 1630   PROTEINUR NEGATIVE 03/07/2022 1630   UROBILINOGEN 0.2 08/24/2017  1211   NITRITE NEGATIVE 03/07/2022 1630   LEUKOCYTESUR NEGATIVE 03/07/2022 1630    Sepsis Labs: Lactic Acid, Venous    Component Value Date/Time   LATICACIDVEN 1.2 03/07/2022 1630    MICROBIOLOGY: No results found for this or any previous visit (from the past 240 hour(s)).   RADIOLOGY STUDIES/RESULTS: No results found.   LOS: 16 days   Signature  -    Susa Raring M.D on 05/14/2022 at 9:08 AM   -  To page go to [email or web address]       05/14/2022, 9:08 AM

## 2022-05-14 NOTE — Progress Notes (Signed)
Ms. Crislip is still hanging in.  I am little surprised that she is still with [place].  She really is not responsive.  She is on the morphine infusion at 8 mg an hour.  She does have some changes in her breathing.  Her breathing does slow down and then speed up a little bit.  This is quite subtle but I think it is an indicator that her critical part of the brain is beginning to slow down.  Listen to her heart, her heart rate fluctuates with her breathing.  Her family is still hanging in with her.  There is such a good family.  They are very supportive.  Another nursing staff is also doing a good job with her.  At this point, I really do not think we have to move her around.  I does do not want to do anything that will cause any kind of agitation.  I would like to think that she will not make it through the weekend.  However, in situations like this it is sometimes hard to put a "timeline" as to when a patient will pass on.  Again, she seems to be very comfortable.  There is no labored breathing.  We just have to stay vigilant.  Again I know everybody is doing a fantastic job with her.  Christin Bach, MD  1 Thessalonians 5:17

## 2022-05-15 DIAGNOSIS — I639 Cerebral infarction, unspecified: Secondary | ICD-10-CM | POA: Diagnosis not present

## 2022-05-15 NOTE — Progress Notes (Signed)
PROGRESS NOTE        PATIENT DETAILS Name: Carla Clark Age: 37 y.o. Sex: female Date of Birth: Aug 14, 1985 Admit Date: 05/15/2022 Admitting Physician Clydie Braun, MD ZOX:[id number], Bryon Lions, MD  Brief Summary: Patient is a 36 y.o.  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  Hospital course was complicated by epistaxis requiring ENT evaluation and nasal packing, transient SBO.  See below for further details.  Significant events: 4/4>> admit to [facility] 4/15>> worsening encephalopathy-transition to full comfort measures.  Significant studies: 4/4>> MRI brain: Possible leptomeningeal metastatic disease, multiple embolic infarcts. 4/5>> CT angio head/neck: No significant stenosis 4/5>> echo: EF 65-70% 4/10>> x-ray abdomen: Dilated loops of small bowel in the right hemiabdomen. 4/10>> CT abdomen/pelvis: SBO with transition point in the right lower quadrant-swirling of the mesentery. 4/14>> MRI brain: No substantial change in multifocal small areas of restricted diffusion in frontoparietal lobes.  No evidence of new abnormality.  Significant microbiology data: 4/4>> blood culture: No growth 4/4>> CSF culture: No growth  Procedures: 4/4>> fluoroscopy guided lumbar puncture  Consults: ENT Palliative Hematology/oncology Neurology Infection disease  Subjective: Unresponsive-having apneic spells this morning.  Objective: Vitals: Blood pressure 107/63, pulse (!) 136, temperature 98.2 F (36.8 C), temperature source Axillary, resp. rate 16, height  (1.6 m), weight 49.1 kg, SpO2 (!) 89 %, currently breastfeeding.   Exam:  Patient in bed unresponsive, on morphine drip, unable to answer questions or follow commands but  appears to be in no distress.  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    Palliative care  DNR in place Significant worsening over the past several days-has now been transitioned to full comfort measures.  Appears comfortable overnight-morphine infusion dosage has been adjusted by nursing staff to ensure no further breakthrough pain.  She is now having apneic spells-suspect inpatient that in a matter of hours/few days.  Do not think she is stable to be moved to residential hospice at this point.  Acute ischemic stroke Embolic in the setting of hypercoagulable state due to breast cancer Workup as above Briefly on anticoagulation-complicated by bowel obstruction requiring NG tube placement (although NG tube was never placed) intermittent epistaxis.  Eliquis resumed on 4/14-but patient worsened and developed encephalopathy-and subsequently transition to full comfort measures.  No plans to resume anticoagulation at this point.    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 Oncology started olaparib-however patient unfortunately has worsened significantly-very encephalopathic and has subsequently been transitioned to full comfort measures.  Transaminitis Thought to be immune mediated due to [facility] Liver enzymes improved with steroids-plan was to taper steroids slowly-however since initiated on comfort measures-no longer on steroids.  No plans to follow LFTs at this point.    Epistaxis S/p bilateral cauterization-with bilateral nasal packings-this was removed on 4/10 Continues to have mild epistaxis almost on a daily basis even  without anticoagulation (Lovenox discontinued 4/10).  Thankfully these episodes are self-limited/resolved with gentle pressure.  SBO Developed vomiting from 4/9-4/10-CT abdomen showed possible SBO Thankfully resolved with supportive care-did not even require NG tube.    Vomiting Has had mild intermittent vomiting almost on a daily basis for the past several days.  Abdominal exam appears benign.  No evidence of SBO on x-rays. Suspicion that this is related to her malignancy with leptomeningeal spread-and multiple medications including olaparib.   Continue antiemetics.  Hypokalemia Due to intermittent vomiting Repleted.  History of C. difficile colitis Plan was to continue oral vancomycin x 5 days after stopping Augmentin-4/09-however due to vomiting-this has not been discontinued.   No recurrence of diarrhea  Normocytic anemia Due to underlying malignancy S/p 2 units of PRBC on 4/6  Hyponatremia Mild Follow.  GERD Pepcid  Abnormal EBV/CMV serology Evaluated by infectious disease-unclear significance.  Debility/deconditioning Worsening over the past several days Plans were for home health-however  BMI: Estimated body mass index is 19.17 kg/m as calculated from the following:   Height as of this encounter:  (1.6 m).   Weight as of this encounter: 49.1 kg.    Code status:   Code Status: DNR   DVT Prophylaxis: SCD  Family Communication: Spouse at bedside   Disposition Plan: Status is: Inpatient Remains inpatient appropriate because: Severity of illness   Planned Discharge Destination: Inpatient death   Diet: Diet Order     None         Antimicrobial agents: Anti-infectives (From admission, onward)    Start     Dose/Rate Route Frequency Ordered Stop   05/02/22 2200  vancomycin (VANCOCIN) capsule 125 mg  Status:  Discontinued        125 mg Oral 2 times daily 05/02/22 1345 05/06/22 0609   05/01/22 1100  amoxicillin-clavulanate (AUGMENTIN) 875-125  MG per tablet 1 tablet  Status:  Discontinued        1 tablet Oral Every 12 hours 05/01/22 1012 05/05/22 0624   04/29/22 1000  vancomycin (VANCOREADY) IVPB 750 mg/150 mL  Status:  Discontinued        750 mg 150 mL/hr over 60 Minutes Intravenous Every 12 hours 05/24/2022 1858 04/29/22 0954   04/29/22 0715  vancomycin (VANCOCIN) capsule 125 mg  Status:  Discontinued        125 mg Oral 3 times daily before meals & bedtime 04/29/22 0702 05/02/22 1345   04/29/22 0600  vancomycin (VANCOREADY) IVPB 750 mg/150 mL  Status:  Discontinued        750 mg 150 mL/hr over 60 Minutes Intravenous Every 12 hours 05/12/2022 1732 05/19/2022 1858   05/09/2022 1900  vancomycin (VANCOREADY) IVPB 1250 mg/250 mL        1,250 mg 166.7 mL/hr over 90 Minutes Intravenous  Once 05/03/2022 1841 05/16/2022 2300   05/12/2022 1706  ampicillin (OMNIPEN) 2 g in sodium chloride 0.9 % 100 mL IVPB  Status:  Discontinued        2 g 300 mL/hr over 20 Minutes Intravenous Every 4 hours 05/22/2022 1634 04/29/22 0954   05/08/2022 1645  cefTRIAXone (ROCEPHIN) 2 g in sodium chloride 0.9 % 100 mL IVPB  Status:  Discontinued        2 g 200 mL/hr over 30 Minutes Intravenous Every 12 hours 05/03/2022 1634 04/29/22 0954   05/05/2022 1645  vancomycin (VANCOREADY) IVPB 1250 mg/250 mL  Status:  Discontinued        1,250 mg 166.7 mL/hr over 90 Minutes Intravenous  Once 04/30/2022 1634 05/15/2022  4098        MEDICATIONS: Scheduled Meds:  scopolamine  1 patch Transdermal Q72H   tobramycin  1 drop Right Eye Q6H   Continuous Infusions:  lactated ringers 10 mL/hr at 05/12/22 0729   morphine 8 mg/hr (05/15/22 0151)   PRN Meds:.acetaminophen **OR** acetaminophen, artificial tears, [DISCONTINUED] haloperidol **OR** haloperidol **OR** haloperidol lactate, LORazepam, morphine, polyvinyl alcohol, [DISCONTINUED] prochlorperazine **OR** [DISCONTINUED] prochlorperazine **OR** prochlorperazine   I have personally reviewed following labs and imaging studies  LABORATORY  DATA: CBC: Recent Labs  Lab 05/09/22 0523  WBC 4.4  NEUTROABS 1.4*  HGB 10.2*  HCT 30.2*  MCV 89.9  PLT 144*    Basic Metabolic Panel: Recent Labs  Lab 05/09/22 0523  NA 135  K 3.8  CL 107  CO2 22  GLUCOSE 112*  BUN 11  CREATININE 0.45  CALCIUM 8.0*    GFR: Estimated Creatinine Clearance: 76.8 mL/min (by C-G formula based on SCr of 0.45 mg/dL).  Liver Function Tests: Recent Labs  Lab 05/09/22 0523  AST 58*  ALT 163*  ALKPHOS 569*  BILITOT 1.3*  PROT 5.3*  ALBUMIN 2.5*   No results for input(s): "LIPASE", "AMYLASE" in the last 168 hours. No results for input(s): "AMMONIA" in the last 168 hours.  Coagulation Profile: Recent Labs  Lab 05/09/22 0523  INR 1.2    Cardiac Enzymes: No results for input(s): "CKTOTAL", "CKMB", "CKMBINDEX", "TROPONINI" in the last 168 hours.  BNP (last 3 results) No results for input(s): "PROBNP" in the last 8760 hours.  Lipid Profile: No results for input(s): "CHOL", "HDL", "LDLCALC", "TRIG", "CHOLHDL", "LDLDIRECT" in the last 72 hours.  Thyroid Function Tests: No results for input(s): "TSH", "T4TOTAL", "FREET4", "T3FREE", "THYROIDAB" in the last 72 hours.  Anemia Panel: No results for input(s): "VITAMINB12", "FOLATE", "FERRITIN", "TIBC", "IRON", "RETICCTPCT" in the last 72 hours.  Urine analysis:    Component Value Date/Time   COLORURINE STRAW (A) 03/07/2022 1630   APPEARANCEUR CLEAR 03/07/2022 1630   LABSPEC 1.031 (H) 03/07/2022 1630   PHURINE 7.0 03/07/2022 1630   GLUCOSEU NEGATIVE 03/07/2022 1630   GLUCOSEU NEGATIVE 08/24/2017 1211   HGBUR NEGATIVE 03/07/2022 1630   BILIRUBINUR NEGATIVE 03/07/2022 1630   BILIRUBINUR N 08/18/2015 1554   KETONESUR NEGATIVE 03/07/2022 1630   PROTEINUR NEGATIVE 03/07/2022 1630   UROBILINOGEN 0.2 08/24/2017 1211   NITRITE NEGATIVE 03/07/2022 1630   LEUKOCYTESUR NEGATIVE 03/07/2022 1630    Sepsis Labs: Lactic Acid, Venous    Component Value Date/Time   LATICACIDVEN 1.2  03/07/2022 1630    MICROBIOLOGY: No results found for this or any previous visit (from the past 240 hour(s)).   RADIOLOGY STUDIES/RESULTS: No results found.   LOS: 17 days   Signature  -    Susa Raring M.D on 05/15/2022 at 9:22 AM   -  To page go to [email or web address]       05/15/2022, 9:22 AM

## 2022-05-16 DIAGNOSIS — Z515 Encounter for palliative care: Secondary | ICD-10-CM | POA: Diagnosis not present

## 2022-05-16 DIAGNOSIS — I639 Cerebral infarction, unspecified: Secondary | ICD-10-CM | POA: Diagnosis not present

## 2022-05-16 DIAGNOSIS — R063 Periodic breathing: Secondary | ICD-10-CM

## 2022-05-16 DIAGNOSIS — R1110 Vomiting, unspecified: Secondary | ICD-10-CM | POA: Diagnosis not present

## 2022-05-16 DIAGNOSIS — R519 Headache, unspecified: Secondary | ICD-10-CM | POA: Diagnosis not present

## 2022-05-16 DIAGNOSIS — G96198 Other disorders of meninges, not elsewhere classified: Secondary | ICD-10-CM | POA: Diagnosis not present

## 2022-05-16 DIAGNOSIS — R42 Dizziness and giddiness: Secondary | ICD-10-CM | POA: Diagnosis not present

## 2022-05-16 NOTE — Progress Notes (Signed)
Daily Progress Note   Patient Name: Carla Clark       Date: 05/16/2022 DOB: Jan 23, 1986  Age: 37 y.o. MRN#: [id number] Attending Physician: Leroy Sea, MD Primary Care Physician: Bradd Canary, MD Admit Date: 04/29/2022  Reason for Consultation/Follow-up: Establishing goals of care  Subjective: Medical records reviewed including progress notes, labs, imaging.  Patient assessed at bedside.  She is unresponsive, appears comfortable on 10 mg/hr morphine drip.  Her husband, mother-in-law, and brother are present visiting.  They were then joined by a couple more visitors on my way out.  Emotional support and therapeutic listening was provided as patient's family shared their perspective of Carla Clark's journey since our last discussion, as well as their own difficulties, both physical and emotional.  They are surprised that she is still hanging on and share that this adds additional stress, as they do not want to leave the bedside and have called family in to be with her for several "false alarms."  We discussed the option of discontinuing IV fluids and family is agreeable.  Discussed with Dr. Thedore Mins.  Questions and concerns addressed.  Family was encouraged to reach out with additional needs.  PMT will continue to support holistically.   Length of Stay: 18  Physical Exam Vitals and nursing note reviewed.  Constitutional:      General: She is not in acute distress.    Appearance: She is ill-appearing.  HENT:     Ears:     Comments: Wound on R ear from pressure injury Pulmonary:     Effort: Pulmonary effort is normal.  Skin:    General: Skin is dry.  Neurological:     Mental Status: She is unresponsive.            Vital Signs: BP 107/63 (BP Location: Left Arm)   Pulse (!) 136   Temp  98.2 F (36.8 C) (Axillary)   Resp 16   Ht  (1.6 m)   Wt 49.1 kg   SpO2 (!) 89%   BMI 19.17 kg/m  SpO2: SpO2: (!) 89 % O2 Device: O2 Device: Room Air O2 Flow Rate:        Palliative Assessment/Data: 60%   Palliative Care Assessment & Plan   Patient Profile: 37 y.o. female  with past medical history of metastatic breast  cancer currently undergoing chemotherapy, Crohn's disease with ileitis s/p left hemicolectomy, SBO, and gerd admitted on 05/03/2022 with headaches and bloody emesis.    Patient initially presented to [facility] [place] ED then returned due to persistent symptoms. MRI of the brain noted 1.4 focus of enhancement within or along the right postcentral sulcus with adjacent cortical edema, 1.3 focus of enhancement along the medial left parietal lobe with adjacent cortical edema, multiple infarcts noted within the bilateral frontal parietal lobes of various ages thought to be embolic in nature, and prominent enhancement bilateral 7th and 8th cranial nerves within the auditory canals concern for leptomeningeal metastatic disease.   PMT has been consulted to assist with goals of care conversation.  Assessment: Goals of care conversation Multiple CVAs Breast cancer with metastases and leptomeningeal spread Epistaxis  Recommendations/Plan: Continue DNR Continue comfort care measures Discontinue IV fluids Psychosocial and emotional support provided PMT remains available as needed. Please secure chat or call team line with needs. Will otherwise follow peripherally   Prognosis: Poor prognosis with metastatic breast cancer and new leptomeningeal spread   Discharge Planning: Anticipated Hospital Death  Care plan was discussed with patient's husband, patient's mother   Total time: I spent 35 minutes in the care of the patient today in the above activities and documenting the encounter.    Richardson Dopp, PA-C Palliative Medicine Team Team phone #  [phone]  Thank you for allowing the Palliative Medicine Team to assist in the care of this patient. Please utilize secure chat with additional questions, if there is no response within 30 minutes please call the above phone number.  Palliative Medicine Team providers are available by phone from 7am to 7pm daily and can be reached through the team cell phone.  Should this patient require assistance outside of these hours, please call the patient's attending physician.  Portions of this note are a verbal dictation therefore any spelling and/or grammatical errors are due to the "[employer]" system interpretation.

## 2022-05-16 NOTE — Progress Notes (Signed)
This chaplain is present for EOL spiritual care with the Pt. and family.   The chaplain listened reflectively to the many ways time at the Pt. bedside is effecting the family's anticipatory grief. The family noted the changes in their presence as a result of needing to resume their individual lives.  Family members were invited by the chaplain to individually explore their needs for support. The chaplain's invitation was accepted. The chaplain understands the Pt. goal remains keep comfort.  The family accepted the chaplain's invitation for prayer and F/U spiritual care as needed.  Chaplain Stephanie Acre [phone]

## 2022-05-16 NOTE — Progress Notes (Signed)
PROGRESS NOTE        PATIENT DETAILS Name: Carla Clark Age: 37 y.o. Sex: female Date of Birth: Aug 14, 1985 Admit Date: 05/15/2022 Admitting Physician Clydie Braun, MD [id number], Bryon Lions, MD  Brief Summary: Patient is a 36 y.o.  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  Hospital course was complicated by epistaxis requiring ENT evaluation and nasal packing, transient SBO.  See below for further details.  Significant events: 4/4>> admit to [facility] 4/15>> worsening encephalopathy-transition to full comfort measures.  Significant studies: 4/4>> MRI brain: Possible leptomeningeal metastatic disease, multiple embolic infarcts. 4/5>> CT angio head/neck: No significant stenosis 4/5>> echo: EF 65-70% 4/10>> x-ray abdomen: Dilated loops of small bowel in the right hemiabdomen. 4/10>> CT abdomen/pelvis: SBO with transition point in the right lower quadrant-swirling of the mesentery. 4/14>> MRI brain: No substantial change in multifocal small areas of restricted diffusion in frontoparietal lobes.  No evidence of new abnormality.  Significant microbiology data: 4/4>> blood culture: No growth 4/4>> CSF culture: No growth  Procedures: 4/4>> fluoroscopy guided lumbar puncture  Consults: ENT Palliative Hematology/oncology Neurology Infection disease  Subjective: Unresponsive-having apneic spells this morning.  Objective: Vitals: Blood pressure 107/63, pulse (!) 136, temperature 98.2 F (36.8 C), temperature source Axillary, resp. rate 16, height  (1.6 m), weight 49.1 kg, SpO2 (!) 89 %, currently breastfeeding.   Exam:  Patient in bed unresponsive, on morphine drip, unable to answer questions or follow commands but  appears to be in no distress.  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    Palliative care  DNR in place Significant worsening over the past several days-has now been transitioned to full comfort measures.  Appears comfortable overnight-morphine infusion dosage has been adjusted by nursing staff to ensure no further breakthrough pain.  She is now having apneic spells-suspect inpatient that in a matter of hours/few days.  Do not think she is stable to be moved to residential hospice at this point.  Acute ischemic stroke Embolic in the setting of hypercoagulable state due to breast cancer Workup as above Briefly on anticoagulation-complicated by bowel obstruction requiring NG tube placement (although NG tube was never placed) intermittent epistaxis.  Eliquis resumed on 4/14-but patient worsened and developed encephalopathy-and subsequently transition to full comfort measures.  No plans to resume anticoagulation at this point.    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 Oncology started olaparib-however patient unfortunately has worsened significantly-very encephalopathic and has subsequently been transitioned to full comfort measures.  Transaminitis Thought to be immune mediated due to [facility] Liver enzymes improved with steroids-plan was to taper steroids slowly-however since initiated on comfort measures-no longer on steroids.  No plans to follow LFTs at this point.    Epistaxis S/p bilateral cauterization-with bilateral nasal packings-this was removed on 4/10 Continues to have mild epistaxis almost on a daily basis even  without anticoagulation (Lovenox discontinued 4/10).  Thankfully these episodes are self-limited/resolved with gentle pressure.  SBO Developed vomiting from 4/9-4/10-CT abdomen showed possible SBO Thankfully resolved with supportive care-did not even require NG tube.    Vomiting Has had mild intermittent vomiting almost on a daily basis for the past several days.  Abdominal exam appears benign.  No evidence of SBO on x-rays. Suspicion that this is related to her malignancy with leptomeningeal spread-and multiple medications including olaparib.   Continue antiemetics.  Hypokalemia Due to intermittent vomiting Repleted.  History of C. difficile colitis Plan was to continue oral vancomycin x 5 days after stopping Augmentin-4/09-however due to vomiting-this has not been discontinued.   No recurrence of diarrhea  Normocytic anemia Due to underlying malignancy S/p 2 units of PRBC on 4/6  Hyponatremia Mild Follow.  GERD Pepcid  Abnormal EBV/CMV serology Evaluated by infectious disease-unclear significance.  Debility/deconditioning Worsening over the past several days Plans were for home health-however  BMI: Estimated body mass index is 19.17 kg/m as calculated from the following:   Height as of this encounter:  (1.6 m).   Weight as of this encounter: 49.1 kg.    Code status:   Code Status: DNR   DVT Prophylaxis: SCD  Family Communication: Spouse at bedside   Disposition Plan: Status is: Inpatient Remains inpatient appropriate because: Severity of illness   Planned Discharge Destination: Inpatient death   Diet: Diet Order     None         Antimicrobial agents: Anti-infectives (From admission, onward)    Start     Dose/Rate Route Frequency Ordered Stop   05/02/22 2200  vancomycin (VANCOCIN) capsule 125 mg  Status:  Discontinued        125 mg Oral 2 times daily 05/02/22 1345 05/06/22 0609   05/01/22 1100  amoxicillin-clavulanate (AUGMENTIN) 875-125  MG per tablet 1 tablet  Status:  Discontinued        1 tablet Oral Every 12 hours 05/01/22 1012 05/05/22 0624   04/29/22 1000  vancomycin (VANCOREADY) IVPB 750 mg/150 mL  Status:  Discontinued        750 mg 150 mL/hr over 60 Minutes Intravenous Every 12 hours 05/19/2022 1858 04/29/22 0954   04/29/22 0715  vancomycin (VANCOCIN) capsule 125 mg  Status:  Discontinued        125 mg Oral 3 times daily before meals & bedtime 04/29/22 0702 05/02/22 1345   04/29/22 0600  vancomycin (VANCOREADY) IVPB 750 mg/150 mL  Status:  Discontinued        750 mg 150 mL/hr over 60 Minutes Intravenous Every 12 hours 05/19/2022 1732 05/23/2022 1858   04/27/2022 1900  vancomycin (VANCOREADY) IVPB 1250 mg/250 mL        1,250 mg 166.7 mL/hr over 90 Minutes Intravenous  Once 05/03/2022 1841 05/20/2022 2300   05/06/2022 1706  ampicillin (OMNIPEN) 2 g in sodium chloride 0.9 % 100 mL IVPB  Status:  Discontinued        2 g 300 mL/hr over 20 Minutes Intravenous Every 4 hours 05/18/2022 1634 04/29/22 0954   05/01/2022 1645  cefTRIAXone (ROCEPHIN) 2 g in sodium chloride 0.9 % 100 mL IVPB  Status:  Discontinued        2 g 200 mL/hr over 30 Minutes Intravenous Every 12 hours 05/04/2022 1634 04/29/22 0954   05/16/2022 1645  vancomycin (VANCOREADY) IVPB 1250 mg/250 mL  Status:  Discontinued        1,250 mg 166.7 mL/hr over 90 Minutes Intravenous  Once 05/02/2022 1634 05/01/2022  1610        MEDICATIONS: Scheduled Meds:  scopolamine  1 patch Transdermal Q72H   tobramycin  1 drop Right Eye Q6H   Continuous Infusions:  lactated ringers 10 mL/hr at 05/16/22 0800   morphine 10 mg/hr (05/16/22 0800)   PRN Meds:.acetaminophen **OR** acetaminophen, artificial tears, [DISCONTINUED] haloperidol **OR** haloperidol **OR** haloperidol lactate, LORazepam, morphine, polyvinyl alcohol, [DISCONTINUED] prochlorperazine **OR** [DISCONTINUED] prochlorperazine **OR** prochlorperazine   I have personally reviewed following labs and imaging studies  LABORATORY  DATA: CBC: No results for input(s): "WBC", "NEUTROABS", "HGB", "HCT", "MCV", "PLT" in the last 168 hours.   Basic Metabolic Panel: No results for input(s): "NA", "K", "CL", "CO2", "GLUCOSE", "BUN", "CREATININE", "CALCIUM", "MG", "PHOS" in the last 168 hours.   GFR: Estimated Creatinine Clearance: 76.8 mL/min (by C-G formula based on SCr of 0.45 mg/dL).  Liver Function Tests: No results for input(s): "AST", "ALT", "ALKPHOS", "BILITOT", "PROT", "ALBUMIN" in the last 168 hours.  No results for input(s): "LIPASE", "AMYLASE" in the last 168 hours. No results for input(s): "AMMONIA" in the last 168 hours.  Coagulation Profile: No results for input(s): "INR", "PROTIME" in the last 168 hours.   Cardiac Enzymes: No results for input(s): "CKTOTAL", "CKMB", "CKMBINDEX", "TROPONINI" in the last 168 hours.  BNP (last 3 results) No results for input(s): "PROBNP" in the last 8760 hours.  Lipid Profile: No results for input(s): "CHOL", "HDL", "LDLCALC", "TRIG", "CHOLHDL", "LDLDIRECT" in the last 72 hours.  Thyroid Function Tests: No results for input(s): "TSH", "T4TOTAL", "FREET4", "T3FREE", "THYROIDAB" in the last 72 hours.  Anemia Panel: No results for input(s): "VITAMINB12", "FOLATE", "FERRITIN", "TIBC", "IRON", "RETICCTPCT" in the last 72 hours.  Urine analysis:    Component Value Date/Time   COLORURINE STRAW (A) 03/07/2022 1630   APPEARANCEUR CLEAR 03/07/2022 1630   LABSPEC 1.031 (H) 03/07/2022 1630   PHURINE 7.0 03/07/2022 1630   GLUCOSEU NEGATIVE 03/07/2022 1630   GLUCOSEU NEGATIVE 08/24/2017 1211   HGBUR NEGATIVE 03/07/2022 1630   BILIRUBINUR NEGATIVE 03/07/2022 1630   BILIRUBINUR N 08/18/2015 1554   KETONESUR NEGATIVE 03/07/2022 1630   PROTEINUR NEGATIVE 03/07/2022 1630   UROBILINOGEN 0.2 08/24/2017 1211   NITRITE NEGATIVE 03/07/2022 1630   LEUKOCYTESUR NEGATIVE 03/07/2022 1630    Sepsis Labs: Lactic Acid, Venous    Component Value Date/Time   LATICACIDVEN 1.2  03/07/2022 1630    MICROBIOLOGY: No results found for this or any previous visit (from the past 240 hour(s)).   RADIOLOGY STUDIES/RESULTS: No results found.   LOS: 18 days   Signature  -    Susa Raring M.D on 05/16/2022 at 10:04 AM   -  To page go to [email or web address]   05/16/2022, 10:04 AM

## 2022-05-16 NOTE — Progress Notes (Signed)
Surprisingly, Carla Clark is still hanging in.  Amazed that she is still with [place].  The morphine infusion is now at 10 mg an hour.  She is getting some Ativan.  There is some Cheyne-Stokes respiration.  It is very subtle.  There has been no seizure activity.  Her family is still hanging in with her.  I must say that this has lasted a lot longer than I would have thought.  I really thought that when everything started, we will be looking at 3-4 days.  She is quite young.  She has more reserved than I thought.  However, I realize that this is not in our hands.  When I listen to her, her lungs are still clear.  Cardiac exam is tachycardic.  She has occasional pauses with her heart rate.  Again, we just are in a "waiting mode."  I would like to hope that this is not going to be too much longer prolonged.  I just hate this for her and her family.  I know that the staff on [address] are doing a fantastic job with her.  Christin Bach, MD  Psalm [phone]

## 2022-05-17 DIAGNOSIS — I639 Cerebral infarction, unspecified: Secondary | ICD-10-CM | POA: Diagnosis not present

## 2022-05-17 DIAGNOSIS — R1110 Vomiting, unspecified: Secondary | ICD-10-CM | POA: Diagnosis not present

## 2022-05-17 DIAGNOSIS — R42 Dizziness and giddiness: Secondary | ICD-10-CM | POA: Diagnosis not present

## 2022-05-17 DIAGNOSIS — R519 Headache, unspecified: Secondary | ICD-10-CM | POA: Diagnosis not present

## 2022-05-23 ENCOUNTER — Inpatient Hospital Stay: Payer: Commercial Managed Care - PPO

## 2022-05-23 ENCOUNTER — Inpatient Hospital Stay: Payer: Commercial Managed Care - PPO | Admitting: Hematology & Oncology

## 2022-05-25 NOTE — Progress Notes (Signed)
I have to believe that this will be the final day for Mrs. Carla Clark.  Her breathing clearly has changed.  She is breathing very shallow.  She is having some Cheyne-Stokes pattern.  Her heart is irregular.  Her oxygen saturation is on the lower side.  Her family is certainly ready for her to pass on.  I notices really rough on them.  They have been with her the entire time.  I know they are tired.  However, they we will do what they need to do to be with her during her final days on this planet.  She is very comfortable.  Her lungs are nice and clear.  I do not hear any congestion.  There is no secretions.  Again she is comfortable.  Her breathing is not labored.  It is quite shallow.  I know that she has done an incredible job.  She has fought hard.  She has done everything we have asked her to do.  Her poor body just cannot give any more.  I know that the staff up on [address] have done a great job with her and her family.  Appreciate all of the compassion that they have shown.  Christin Bach, MD  2 Timothy 4:16-18

## 2022-05-25 NOTE — Death Summary Note (Signed)
[facility] Death Note                                                                                                                                                                                               Carla Clark, is a 37 y.o. female, DOB - April 08, 1985, WUJ:[id number]  Admit date - May 08, 2022   Admitting Physician Clydie Braun, MD  Outpatient Primary MD for the patient is Bradd Canary, MD  LOS - 19  Chief Complaint  Patient presents with   Emesis   Dizziness       Notification: Bradd Canary, MD notified of death of 05-27-22   Admit Date:  05-08-22  Date of Death: Date of Death: 27-May-2022  Time of Death:  9:44 AM  Length of Stay: 19    Date and Time of Death -   Pronounced by -   History of present illness:   Carla Clark is a 37 y.o. female with a history of -metastatic breast cancer Crohn's disease-s/p left hemicolectomy who presented with headache/emesis-she was found to have multiple embolic CVAs in the setting of hypercoagulable state, she was also found to have leptomeningeal spread of her breast cancer.  Hospital course was complicated by epistaxis requiring ENT evaluation and nasal packing, transient SBO.  Spite appropriate treatment she continued to decline, she was seen by oncology, ENT, neurology, she was subsequently transitioned to full comfort care and on IV morphine drip.  Passed away in comfort on 27-May-2022 pronounced dead by the RN.   Final Diagnoses:  Cause if death -metastatic breast cancer  Signature  -    Susa Raring M.D on May 27, 2022 at 9:48 AM   -  To page go to [email or web address]   Total clinical and documentation time for today Under 30 minutes   Last Note                           PROGRESS NOTE        PATIENT DETAILS Name: Carla Clark Age: 37 y.o. Sex: female Date of Birth: 1985-10-14 Admit Date: 05-08-22 Admitting Physician Clydie Braun, MD [id number], Bryon Lions, MD  Brief Summary: Patient is a 37 y.o.  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  Hospital course was complicated by epistaxis requiring ENT evaluation and nasal packing, transient SBO.  See below for further details.  Significant  events: 4/4>> admit to [facility] 4/15>> worsening encephalopathy-transition to full comfort measures.  Significant studies: 4/4>> MRI brain: Possible leptomeningeal metastatic disease, multiple embolic infarcts. 4/5>> CT angio head/neck: No significant stenosis 4/5>> echo: EF 65-70% 4/10>> x-ray abdomen: Dilated loops of small bowel in the right hemiabdomen. 4/10>> CT abdomen/pelvis: SBO with transition point in the right lower quadrant-swirling of the mesentery. 4/14>> MRI brain: No substantial change in multifocal small areas of restricted diffusion in frontoparietal lobes.  No evidence of new abnormality.  Significant microbiology data: 4/4>> blood culture: No growth 4/4>> CSF culture: No growth  Procedures: 4/4>> fluoroscopy guided lumbar puncture  Consults: ENT Palliative Hematology/oncology Neurology Infection disease  Subjective: Unresponsive-having apneic spells this morning.  Objective: Vitals: Blood pressure 107/63, pulse (!) 136, temperature 98.2 F (36.8 C), temperature source Axillary, resp. rate 16, height 5\' 3"  (1.6 m), weight 49.1 kg, SpO2 (!) 89 %, currently breastfeeding.   Exam:  Patient in bed unresponsive, on morphine drip, unable to answer questions or follow commands but appears to be in no distress.  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    Palliative care  DNR in place Significant worsening over the past several days-has now been transitioned to full comfort measures.  Appears comfortable overnight-morphine infusion dosage has been adjusted by nursing staff to ensure no further breakthrough pain.  She is now having apneic spells-suspect inpatient that in a matter of hours/few days.  Do not think she is stable to be moved to residential hospice at this point.  Acute ischemic stroke Embolic in the setting of hypercoagulable state due to breast cancer Workup as above Briefly on anticoagulation-complicated by bowel obstruction requiring NG tube placement (although NG tube was never placed) intermittent epistaxis.  Eliquis resumed on 4/14-but patient worsened and developed encephalopathy-and subsequently transition to full comfort measures.  No plans to resume anticoagulation at this point.    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 Oncology started olaparib-however patient unfortunately has worsened significantly-very encephalopathic and has subsequently been transitioned to full comfort measures.  Transaminitis Thought to be immune mediated due to [facility] Liver enzymes improved with steroids-plan was to taper steroids slowly-however since initiated on comfort measures-no longer on steroids.  No plans to follow LFTs at this point.    Epistaxis S/p bilateral cauterization-with bilateral nasal packings-this was removed on 4/10 Continues to have mild epistaxis almost on a daily basis even without anticoagulation (Lovenox discontinued 4/10).  Thankfully these episodes are self-limited/resolved with gentle pressure.  SBO Developed vomiting from 4/9-4/10-CT abdomen showed possible  SBO Thankfully resolved with supportive care-did not even require NG tube.    Vomiting Has had mild intermittent vomiting almost on a daily basis for the past several days.  Abdominal exam appears benign.  No evidence of SBO on x-rays. Suspicion that this is related to her malignancy with leptomeningeal spread-and multiple medications including olaparib.   Continue antiemetics.  Hypokalemia Due to intermittent vomiting Repleted.  History of C. difficile colitis Plan was to continue oral vancomycin x 5 days after stopping Augmentin-4/09-however due to vomiting-this has not been discontinued.   No recurrence of diarrhea  Normocytic anemia Due to underlying malignancy S/p 2 units of PRBC on 4/6  Hyponatremia Mild Follow.  GERD Pepcid  Abnormal EBV/CMV serology Evaluated by infectious disease-unclear significance.  Debility/deconditioning Worsening over the past several days Plans were for home health-however  BMI: Estimated body mass index is 19.17 kg/m as calculated from the following:   Height as of this encounter:  (1.6 m).   Weight as of this encounter: 49.1 kg.    Code status:   Code Status: DNR   DVT Prophylaxis: SCD  Family Communication: Spouse at bedside   Disposition Plan: Status is: Inpatient Remains inpatient appropriate because: Severity of illness   Planned Discharge Destination: Inpatient death   Diet: Diet Order     None         Antimicrobial agents: Anti-infectives (From admission, onward)    Start     Dose/Rate Route Frequency Ordered Stop   05/02/22 2200  vancomycin (VANCOCIN) capsule 125 mg  Status:  Discontinued        125 mg Oral 2 times daily 05/02/22 1345 05/06/22 0609   05/01/22 1100  amoxicillin-clavulanate (AUGMENTIN) 875-125 MG per tablet 1 tablet  Status:  Discontinued        1 tablet Oral Every 12 hours 05/01/22 1012 05/05/22 0624   04/29/22 1000  vancomycin (VANCOREADY) IVPB 750 mg/150 mL  Status:  Discontinued         750 mg 150 mL/hr over 60 Minutes Intravenous Every 12 hours 05/19/2022 1858 04/29/22 0954   04/29/22 0715  vancomycin (VANCOCIN) capsule 125 mg  Status:  Discontinued        125 mg Oral 3 times daily before meals & bedtime 04/29/22 0702 05/02/22 1345   04/29/22 0600  vancomycin (VANCOREADY) IVPB 750 mg/150 mL  Status:  Discontinued        750 mg 150 mL/hr over 60 Minutes Intravenous Every 12 hours 05/24/2022 1732 04/29/2022 1858   05/13/2022 1900  vancomycin (VANCOREADY) IVPB 1250 mg/250 mL        1,250 mg 166.7 mL/hr over 90 Minutes Intravenous  Once 05/06/2022 1841 05/16/2022 2300   05/24/2022 1706  ampicillin (OMNIPEN) 2 g in sodium chloride 0.9 % 100 mL IVPB  Status:  Discontinued        2 g 300 mL/hr over 20 Minutes Intravenous Every 4 hours 05/10/2022 1634 04/29/22 0954   05/23/2022 1645  cefTRIAXone (ROCEPHIN) 2 g in sodium chloride 0.9 % 100 mL IVPB  Status:  Discontinued        2 g 200 mL/hr over 30 Minutes Intravenous Every 12 hours 05/05/2022 1634 04/29/22 0954   05/14/2022 1645  vancomycin (VANCOREADY) IVPB 1250 mg/250 mL  Status:  Discontinued        1,250 mg 166.7 mL/hr over 90 Minutes Intravenous  Once 05/01/2022 1634 05/09/2022 1841        MEDICATIONS: Scheduled Meds:  scopolamine  1 patch Transdermal Q72H   tobramycin  1 drop Right Eye Q6H   Continuous Infusions:  morphine 10 mg/hr (05/14/2022 0604)   PRN Meds:.acetaminophen **OR** acetaminophen, artificial tears, [DISCONTINUED] haloperidol **OR** haloperidol **OR** haloperidol lactate, LORazepam, morphine, polyvinyl alcohol, [DISCONTINUED] prochlorperazine **OR** [DISCONTINUED] prochlorperazine **OR** prochlorperazine   I have personally reviewed following labs and imaging studies  LABORATORY DATA: CBC: No results for input(s): "WBC", "NEUTROABS", "HGB", "HCT", "MCV", "PLT" in the last 168 hours.   Basic Metabolic Panel: No results for input(s): "NA", "K", "CL", "CO2", "GLUCOSE", "BUN", "CREATININE", "CALCIUM", "MG", "PHOS" in  the last 168 hours.   GFR: Estimated Creatinine Clearance: 76.8 mL/min (by C-G formula based on  SCr of 0.45 mg/dL).  Liver Function Tests: No results for input(s): "AST", "ALT", "ALKPHOS", "BILITOT", "PROT", "ALBUMIN" in the last 168 hours.  No results for input(s): "LIPASE", "AMYLASE" in the last 168 hours. No results for input(s): "AMMONIA" in the last 168 hours.  Coagulation Profile: No results for input(s): "INR", "PROTIME" in the last 168 hours.   Cardiac Enzymes: No results for input(s): "CKTOTAL", "CKMB", "CKMBINDEX", "TROPONINI" in the last 168 hours.  BNP (last 3 results) No results for input(s): "PROBNP" in the last 8760 hours.  Lipid Profile: No results for input(s): "CHOL", "HDL", "LDLCALC", "TRIG", "CHOLHDL", "LDLDIRECT" in the last 72 hours.  Thyroid Function Tests: No results for input(s): "TSH", "T4TOTAL", "FREET4", "T3FREE", "THYROIDAB" in the last 72 hours.  Anemia Panel: No results for input(s): "VITAMINB12", "FOLATE", "FERRITIN", "TIBC", "IRON", "RETICCTPCT" in the last 72 hours.  Urine analysis:    Component Value Date/Time   COLORURINE STRAW (A) 03/07/2022 1630   APPEARANCEUR CLEAR 03/07/2022 1630   LABSPEC 1.031 (H) 03/07/2022 1630   PHURINE 7.0 03/07/2022 1630   GLUCOSEU NEGATIVE 03/07/2022 1630   GLUCOSEU NEGATIVE 08/24/2017 1211   HGBUR NEGATIVE 03/07/2022 1630   BILIRUBINUR NEGATIVE 03/07/2022 1630   BILIRUBINUR N 08/18/2015 1554   KETONESUR NEGATIVE 03/07/2022 1630   PROTEINUR NEGATIVE 03/07/2022 1630   UROBILINOGEN 0.2 08/24/2017 1211   NITRITE NEGATIVE 03/07/2022 1630   LEUKOCYTESUR NEGATIVE 03/07/2022 1630    Sepsis Labs: Lactic Acid, Venous    Component Value Date/Time   LATICACIDVEN 1.2 03/07/2022 1630    MICROBIOLOGY: No results found for this or any previous visit (from the past 240 hour(s)).   RADIOLOGY STUDIES/RESULTS: No results found.   LOS: 19 days   Signature  -    Susa Raring M.D on 05/16/2022 at 9:48 AM   -   To page go to [email or web address]   05/02/2022, 9:48 AM

## 2022-05-25 NOTE — Progress Notes (Signed)
Patient expired at 0944 MD notified. Family present at the bedside.

## 2022-05-25 NOTE — Progress Notes (Signed)
Patient to morgue. Family left and gave information for funeral home.

## 2022-05-25 NOTE — Progress Notes (Signed)
PROGRESS NOTE        PATIENT DETAILS Name: Carla Clark Age: 37 y.o. Sex: female Date of Birth: Jan 01, 1986 Admit Date: 05/13/2022 Admitting Physician Clydie Braun, MD WUJ:[id number], Bryon Lions, MD  Brief Summary: Patient is a 37 y.o.  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  Hospital course was complicated by epistaxis requiring ENT evaluation and nasal packing, transient SBO.  See below for further details.  Significant events: 4/4>> admit to [facility] 4/15>> worsening encephalopathy-transition to full comfort measures.  Significant studies: 4/4>> MRI brain: Possible leptomeningeal metastatic disease, multiple embolic infarcts. 4/5>> CT angio head/neck: No significant stenosis 4/5>> echo: EF 65-70% 4/10>> x-ray abdomen: Dilated loops of small bowel in the right hemiabdomen. 4/10>> CT abdomen/pelvis: SBO with transition point in the right lower quadrant-swirling of the mesentery. 4/14>> MRI brain: No substantial change in multifocal small areas of restricted diffusion in frontoparietal lobes.  No evidence of new abnormality.  Significant microbiology data: 4/4>> blood culture: No growth 4/4>> CSF culture: No growth  Procedures: 4/4>> fluoroscopy guided lumbar puncture  Consults: ENT Palliative Hematology/oncology Neurology Infection disease  Subjective: Unresponsive-having apneic spells this morning.  Objective: Vitals: Blood pressure 107/63, pulse (!) 136, temperature 98.2 F (36.8 C), temperature source Axillary, resp. rate 16, height  (1.6 m), weight 49.1 kg, SpO2 (!) 89 %, currently breastfeeding.   Exam:  Patient in bed unresponsive, on morphine drip, unable to answer questions or follow commands but  appears to be in no distress.  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    Palliative care  DNR in place Significant worsening over the past several days-has now been transitioned to full comfort measures.  Appears comfortable overnight-morphine infusion dosage has been adjusted by nursing staff to ensure no further breakthrough pain.  She is now having apneic spells-suspect inpatient that in a matter of hours/few days.  Do not think she is stable to be moved to residential hospice at this point.  Acute ischemic stroke Embolic in the setting of hypercoagulable state due to breast cancer Workup as above Briefly on anticoagulation-complicated by bowel obstruction requiring NG tube placement (although NG tube was never placed) intermittent epistaxis.  Eliquis resumed on 4/14-but patient worsened and developed encephalopathy-and subsequently transition to full comfort measures.  No plans to resume anticoagulation at this point.    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 Oncology started olaparib-however patient unfortunately has worsened significantly-very encephalopathic and has subsequently been transitioned to full comfort measures.  Transaminitis Thought to be immune mediated due to [facility] Liver enzymes improved with steroids-plan was to taper steroids slowly-however since initiated on comfort measures-no longer on steroids.  No plans to follow LFTs at this point.    Epistaxis S/p bilateral cauterization-with bilateral nasal packings-this was removed on 4/10 Continues to have mild epistaxis almost on a daily basis even  without anticoagulation (Lovenox discontinued 4/10).  Thankfully these episodes are self-limited/resolved with gentle pressure.  SBO Developed vomiting from 4/9-4/10-CT abdomen showed possible SBO Thankfully resolved with supportive care-did not even require NG tube.    Vomiting Has had mild intermittent vomiting almost on a daily basis for the past several days.  Abdominal exam appears benign.  No evidence of SBO on x-rays. Suspicion that this is related to her malignancy with leptomeningeal spread-and multiple medications including olaparib.   Continue antiemetics.  Hypokalemia Due to intermittent vomiting Repleted.  History of C. difficile colitis Plan was to continue oral vancomycin x 5 days after stopping Augmentin-4/09-however due to vomiting-this has not been discontinued.   No recurrence of diarrhea  Normocytic anemia Due to underlying malignancy S/p 2 units of PRBC on 4/6  Hyponatremia Mild Follow.  GERD Pepcid  Abnormal EBV/CMV serology Evaluated by infectious disease-unclear significance.  Debility/deconditioning Worsening over the past several days Plans were for home health-however  BMI: Estimated body mass index is 19.17 kg/m as calculated from the following:   Height as of this encounter:  (1.6 m).   Weight as of this encounter: 49.1 kg.    Code status:   Code Status: DNR   DVT Prophylaxis: SCD  Family Communication: Spouse at bedside   Disposition Plan: Status is: Inpatient Remains inpatient appropriate because: Severity of illness   Planned Discharge Destination: Inpatient death   Diet: Diet Order     None         Antimicrobial agents: Anti-infectives (From admission, onward)    Start     Dose/Rate Route Frequency Ordered Stop   05/02/22 2200  vancomycin (VANCOCIN) capsule 125 mg  Status:  Discontinued        125 mg Oral 2 times daily 05/02/22 1345 05/06/22 0609   05/01/22 1100  amoxicillin-clavulanate (AUGMENTIN) 875-125  MG per tablet 1 tablet  Status:  Discontinued        1 tablet Oral Every 12 hours 05/01/22 1012 05/05/22 0624   04/29/22 1000  vancomycin (VANCOREADY) IVPB 750 mg/150 mL  Status:  Discontinued        750 mg 150 mL/hr over 60 Minutes Intravenous Every 12 hours 04/27/2022 1858 04/29/22 0954   04/29/22 0715  vancomycin (VANCOCIN) capsule 125 mg  Status:  Discontinued        125 mg Oral 3 times daily before meals & bedtime 04/29/22 0702 05/02/22 1345   04/29/22 0600  vancomycin (VANCOREADY) IVPB 750 mg/150 mL  Status:  Discontinued        750 mg 150 mL/hr over 60 Minutes Intravenous Every 12 hours 05/11/2022 1732 05/04/2022 1858   05/05/2022 1900  vancomycin (VANCOREADY) IVPB 1250 mg/250 mL        1,250 mg 166.7 mL/hr over 90 Minutes Intravenous  Once 04/27/2022 1841 04/26/2022 2300   05/01/2022 1706  ampicillin (OMNIPEN) 2 g in sodium chloride 0.9 % 100 mL IVPB  Status:  Discontinued        2 g 300 mL/hr over 20 Minutes Intravenous Every 4 hours 05/01/2022 1634 04/29/22 0954   04/29/2022 1645  cefTRIAXone (ROCEPHIN) 2 g in sodium chloride 0.9 % 100 mL IVPB  Status:  Discontinued        2 g 200 mL/hr over 30 Minutes Intravenous Every 12 hours 05/08/2022 1634 04/29/22 0954   04/27/2022 1645  vancomycin (VANCOREADY) IVPB 1250 mg/250 mL  Status:  Discontinued        1,250 mg 166.7 mL/hr over 90 Minutes Intravenous  Once 04/27/2022 1634 05/23/2022  1610        MEDICATIONS: Scheduled Meds:  scopolamine  1 patch Transdermal Q72H   tobramycin  1 drop Right Eye Q6H   Continuous Infusions:  morphine 10 mg/hr (26-May-2022 0604)   PRN Meds:.acetaminophen **OR** acetaminophen, artificial tears, [DISCONTINUED] haloperidol **OR** haloperidol **OR** haloperidol lactate, LORazepam, morphine, polyvinyl alcohol, [DISCONTINUED] prochlorperazine **OR** [DISCONTINUED] prochlorperazine **OR** prochlorperazine   I have personally reviewed following labs and imaging studies  LABORATORY DATA: CBC: No results for input(s): "WBC",  "NEUTROABS", "HGB", "HCT", "MCV", "PLT" in the last 168 hours.   Basic Metabolic Panel: No results for input(s): "NA", "K", "CL", "CO2", "GLUCOSE", "BUN", "CREATININE", "CALCIUM", "MG", "PHOS" in the last 168 hours.   GFR: Estimated Creatinine Clearance: 76.8 mL/min (by C-G formula based on SCr of 0.45 mg/dL).  Liver Function Tests: No results for input(s): "AST", "ALT", "ALKPHOS", "BILITOT", "PROT", "ALBUMIN" in the last 168 hours.  No results for input(s): "LIPASE", "AMYLASE" in the last 168 hours. No results for input(s): "AMMONIA" in the last 168 hours.  Coagulation Profile: No results for input(s): "INR", "PROTIME" in the last 168 hours.   Cardiac Enzymes: No results for input(s): "CKTOTAL", "CKMB", "CKMBINDEX", "TROPONINI" in the last 168 hours.  BNP (last 3 results) No results for input(s): "PROBNP" in the last 8760 hours.  Lipid Profile: No results for input(s): "CHOL", "HDL", "LDLCALC", "TRIG", "CHOLHDL", "LDLDIRECT" in the last 72 hours.  Thyroid Function Tests: No results for input(s): "TSH", "T4TOTAL", "FREET4", "T3FREE", "THYROIDAB" in the last 72 hours.  Anemia Panel: No results for input(s): "VITAMINB12", "FOLATE", "FERRITIN", "TIBC", "IRON", "RETICCTPCT" in the last 72 hours.  Urine analysis:    Component Value Date/Time   COLORURINE STRAW (A) 03/07/2022 1630   APPEARANCEUR CLEAR 03/07/2022 1630   LABSPEC 1.031 (H) 03/07/2022 1630   PHURINE 7.0 03/07/2022 1630   GLUCOSEU NEGATIVE 03/07/2022 1630   GLUCOSEU NEGATIVE 08/24/2017 1211   HGBUR NEGATIVE 03/07/2022 1630   BILIRUBINUR NEGATIVE 03/07/2022 1630   BILIRUBINUR N 08/18/2015 1554   KETONESUR NEGATIVE 03/07/2022 1630   PROTEINUR NEGATIVE 03/07/2022 1630   UROBILINOGEN 0.2 08/24/2017 1211   NITRITE NEGATIVE 03/07/2022 1630   LEUKOCYTESUR NEGATIVE 03/07/2022 1630    Sepsis Labs: Lactic Acid, Venous    Component Value Date/Time   LATICACIDVEN 1.2 03/07/2022 1630    MICROBIOLOGY: No results  found for this or any previous visit (from the past 240 hour(s)).   RADIOLOGY STUDIES/RESULTS: No results found.   LOS: 19 days   Signature  -    Susa Raring M.D on 26-May-2022 at 8:12 AM   -  To page go to [email or web address]   26-May-2022, 8:12 AM

## 2022-05-25 NOTE — Plan of Care (Signed)
  Problem: Pain Managment: Goal: General experience of comfort will improve Outcome: Progressing   

## 2022-05-25 DEATH — deceased

## 2022-05-30 ENCOUNTER — Inpatient Hospital Stay: Payer: Commercial Managed Care - PPO

## 2022-05-30 ENCOUNTER — Inpatient Hospital Stay: Payer: Commercial Managed Care - PPO | Admitting: Hematology & Oncology

## 2022-05-30 LAB — FUNGUS CULTURE WITH STAIN

## 2022-05-30 LAB — FUNGUS CULTURE RESULT

## 2022-05-30 LAB — FUNGAL ORGANISM REFLEX
